# Patient Record
Sex: Male | Born: 1945 | Race: White | Hispanic: No | Marital: Married | Smoking: Former smoker
Health system: Southern US, Community
[De-identification: ages and names within clinical notes are randomized; demographics above are authoritative.]

## PROBLEM LIST (undated history)

## (undated) DIAGNOSIS — R091 Pleurisy: Secondary | ICD-10-CM

## (undated) DIAGNOSIS — C801 Malignant (primary) neoplasm, unspecified: Secondary | ICD-10-CM

## (undated) DIAGNOSIS — R410 Disorientation, unspecified: Secondary | ICD-10-CM

## (undated) DIAGNOSIS — I10 Essential (primary) hypertension: Secondary | ICD-10-CM

## (undated) DIAGNOSIS — I2510 Atherosclerotic heart disease of native coronary artery without angina pectoris: Secondary | ICD-10-CM

## (undated) DIAGNOSIS — H9190 Unspecified hearing loss, unspecified ear: Secondary | ICD-10-CM

## (undated) DIAGNOSIS — L309 Dermatitis, unspecified: Secondary | ICD-10-CM

## (undated) DIAGNOSIS — M549 Dorsalgia, unspecified: Secondary | ICD-10-CM

## (undated) DIAGNOSIS — J329 Chronic sinusitis, unspecified: Secondary | ICD-10-CM

## (undated) DIAGNOSIS — Z8659 Personal history of other mental and behavioral disorders: Secondary | ICD-10-CM

## (undated) DIAGNOSIS — E119 Type 2 diabetes mellitus without complications: Secondary | ICD-10-CM

## (undated) DIAGNOSIS — E7800 Pure hypercholesterolemia, unspecified: Secondary | ICD-10-CM

## (undated) DIAGNOSIS — B354 Tinea corporis: Secondary | ICD-10-CM

## (undated) DIAGNOSIS — I712 Thoracic aortic aneurysm, without rupture: Secondary | ICD-10-CM

## (undated) DIAGNOSIS — K529 Noninfective gastroenteritis and colitis, unspecified: Secondary | ICD-10-CM

## (undated) DIAGNOSIS — B029 Zoster without complications: Secondary | ICD-10-CM

## (undated) DIAGNOSIS — M545 Low back pain, unspecified: Secondary | ICD-10-CM

## (undated) DIAGNOSIS — I7121 Aneurysm of the ascending aorta, without rupture: Secondary | ICD-10-CM

## (undated) DIAGNOSIS — R42 Dizziness and giddiness: Secondary | ICD-10-CM

## (undated) DIAGNOSIS — G8929 Other chronic pain: Secondary | ICD-10-CM

## (undated) DIAGNOSIS — R0989 Other specified symptoms and signs involving the circulatory and respiratory systems: Secondary | ICD-10-CM

## (undated) DIAGNOSIS — K219 Gastro-esophageal reflux disease without esophagitis: Secondary | ICD-10-CM

## (undated) DIAGNOSIS — C61 Malignant neoplasm of prostate: Secondary | ICD-10-CM

## (undated) DIAGNOSIS — E782 Mixed hyperlipidemia: Secondary | ICD-10-CM

## (undated) DIAGNOSIS — Z8709 Personal history of other diseases of the respiratory system: Secondary | ICD-10-CM

## (undated) DIAGNOSIS — R0609 Other forms of dyspnea: Secondary | ICD-10-CM

## (undated) DIAGNOSIS — M159 Polyosteoarthritis, unspecified: Secondary | ICD-10-CM

## (undated) DIAGNOSIS — M2550 Pain in unspecified joint: Secondary | ICD-10-CM

## (undated) DIAGNOSIS — J069 Acute upper respiratory infection, unspecified: Secondary | ICD-10-CM

## (undated) DIAGNOSIS — L409 Psoriasis, unspecified: Secondary | ICD-10-CM

## (undated) HISTORY — DX: Pure hypercholesterolemia, unspecified: E78.00

## (undated) HISTORY — DX: Pleurisy: R09.1

## (undated) HISTORY — DX: Pain in unspecified joint: M25.50

## (undated) HISTORY — DX: Other specified symptoms and signs involving the circulatory and respiratory systems: R09.89

## (undated) HISTORY — DX: Other forms of dyspnea: R06.09

## (undated) HISTORY — DX: Mixed hyperlipidemia: E78.2

## (undated) HISTORY — DX: Type 2 diabetes mellitus without complications: E11.9

## (undated) HISTORY — DX: Acute upper respiratory infection, unspecified: J06.9

## (undated) HISTORY — DX: Tinea corporis: B35.4

## (undated) HISTORY — DX: Essential (primary) hypertension: I10

## (undated) HISTORY — DX: Chronic sinusitis, unspecified: J32.9

## (undated) HISTORY — DX: Zoster without complications: B02.9

## (undated) HISTORY — DX: Noninfective gastroenteritis and colitis, unspecified: K52.9

## (undated) HISTORY — DX: Psoriasis, unspecified: L40.9

## (undated) HISTORY — DX: Low back pain: M54.5

## (undated) HISTORY — DX: Aneurysm of the ascending aorta, without rupture: I71.21

## (undated) HISTORY — DX: Low back pain, unspecified: M54.50

## (undated) HISTORY — DX: Polyosteoarthritis, unspecified: M15.9

## (undated) HISTORY — DX: Thoracic aortic aneurysm, without rupture: I71.2

---

## 2000-08-05 ENCOUNTER — Emergency Department (HOSPITAL_COMMUNITY): Admission: EM | Admit: 2000-08-05 | Discharge: 2000-08-05 | Payer: Self-pay | Admitting: Emergency Medicine

## 2000-08-05 ENCOUNTER — Encounter: Payer: Self-pay | Admitting: Emergency Medicine

## 2004-04-17 ENCOUNTER — Ambulatory Visit: Payer: Self-pay | Admitting: Internal Medicine

## 2007-09-17 ENCOUNTER — Encounter (INDEPENDENT_AMBULATORY_CARE_PROVIDER_SITE_OTHER): Payer: Self-pay | Admitting: Urology

## 2007-09-17 ENCOUNTER — Ambulatory Visit (HOSPITAL_COMMUNITY): Admission: RE | Admit: 2007-09-17 | Discharge: 2007-09-17 | Payer: Self-pay | Admitting: Urology

## 2008-01-15 HISTORY — PX: CIRCUMCISION: SUR203

## 2010-05-29 NOTE — H&P (Signed)
George [name], George [name]                 ACCOUNT NO.:  [id number]   MEDICAL RECORD NO.:  [id number]          PATIENT TYPE:  AMB   LOCATION:  DAY                           FACILITY:  [facility]   PHYSICIAN:  Dennie Maizes, M.D.   DATE OF BIRTH:  05-20-1945   DATE OF ADMISSION:  09/17/2007  DATE OF DISCHARGE:  LH                              HISTORY & PHYSICAL   CHIEF COMPLAINT:  Recurrent inflammation of the foreskin, burning with  passing urine.   HISTORY OF PRESENT ILLNESS:  This 65 year old male was referred to me by  Dr. Sherryll Burger.  He noted recurrent inflammation of the foreskin over the past  3 to 4 years.  The patient complains of burning with passing urine with  urinary frequency x6-8, nocturia x0.  There is no history of hematuria,  urolithiasis, urinary tract infections.  He has discomfort and pain  during sexual intercourse.  He has been unable to have sex for the past  9 months.  He has had diabetes mellitus for the past 6 years.   PAST MEDICAL HISTORY:  History of diabetes mellitus, hypertension.   MEDICATIONS:  Pills for hypertension and diabetes mellitus, Allegra and  aspirin.   ALLERGIES:  None.   OPERATIONS:  None.   FAMILY HISTORY:  Positive for coronary artery disease, MI and diabetes  mellitus.   PHYSICAL EXAMINATION:  VITAL SIGNS:  Height 6 feet, 4 inches, weight 220  pounds.  HEENT:  Normal.  The patient is hard of hearing.  LUNGS:  Clear to auscultation.  HEART:  Regular rate and rhythm.  No murmurs.  ABDOMEN:  Soft.  No palpable frank mass or CVA tenderness.  Bladder is  not palpable.  GENITOURINARY:  Penis has recurrent balanitis with scaring of the  foreskin is noted.  Testes are normal.  There is a small right  epididymal cyst.  RECTAL:  Examination declined by the patient.   IMPRESSION:  Recurrent balanitis, diabetes mellitus.   PLAN:  I have discussed with the patient and his wife regarding  management options.  He is scheduled to undergo circumcision  under  anesthesia at [facility].  I have informed him regarding the  diagnosis, operative details, alternative treatments, outcome, possible  risks and complications, and he has agreed for the procedure to be done.  Aspirin will be discontinued for the week prior to the surgery.      Dennie Maizes, M.D.  Electronically Signed     SK/MEDQ  D:  09/16/2007  T:  09/16/2007  Job:  [id number]   cc:   Dr. Logan Bores [facility]  Fax: [phone]

## 2010-05-29 NOTE — Op Note (Signed)
NAMEMARLEY, George [name]                 ACCOUNT NO.:  [id number]   MEDICAL RECORD NO.:  [id number]          PATIENT TYPE:  AMB   LOCATION:  DAY                           FACILITY:  [facility]   PHYSICIAN:  Dennie Maizes, M.D.   DATE OF BIRTH:  21-Feb-1945   DATE OF PROCEDURE:  09/17/2007  DATE OF DISCHARGE:                               OPERATIVE REPORT   PREOPERATIVE DIAGNOSES:  1. Recurrent balanitis.  2. Diabetes mellitus.   POSTOPERATIVE DIAGNOSES:  1. Recurrent balanitis.  2. Diabetes mellitus.   OPERATIVE PROCEDURE:  Circumcision.   ANESTHESIA:  General.   SURGEON:  Dennie Maizes, MD.   COMPLICATIONS:  None.   ESTIMATED BLOOD LOSS:  Minimal.   SPECIMEN:  Foreskin sent to pathology.   INDICATIONS FOR THE PROCEDURE:  This 65 year old male had diabetes  associated with recurrent balanitis.  He has taken to the operating room  today for circumcision.   DESCRIPTION OF THE PROCEDURE:  General anesthesia was induced and the  patient was placed on the OR table in the supine position.  The lower  abdomen and genitalia were prepped and draped in a sterile fashion.  The  foreskin was then held at 6 and 12 o'clock position with straight  hemostasis.  Dorsal and ventral slits of the foreskin were then made.  The lateral skin flaps were raised.  The redundant foreskin was then  excised.  Hemostasis was  obtained by cauterization.  The edges of the foreskin were approximated  using 4-0 chromic gut.  A Vaseline gauze dressing and Coban are applied  to the penis.  Estimated blood loss was minimal.  The sponges and  instruments were correct x2 at the time of closure.  The patient was  transferred to the PACU in a satisfactory condition.      Dennie Maizes, M.D.  Electronically Signed     SK/MEDQ  D:  09/17/2007  T:  09/17/2007  Job:  [id number]   cc:   Kirstie Peri, MD  Fax: [phone]

## 2010-10-17 LAB — GLUCOSE, CAPILLARY: Glucose-Capillary: 163 — ABNORMAL HIGH

## 2011-01-18 ENCOUNTER — Encounter: Payer: Self-pay | Admitting: Cardiovascular Disease

## 2011-01-22 ENCOUNTER — Telehealth: Payer: Self-pay

## 2011-01-22 NOTE — Telephone Encounter (Signed)
Called and started triage with wife, Carletta. She will call back with complete med list and insurance info. Pt wants to be scheduled with Dr. Jena Gauss and do on a Fri if possible.

## 2011-01-29 NOTE — Telephone Encounter (Signed)
LM with male for pt to call.  

## 2011-01-31 ENCOUNTER — Telehealth: Payer: Self-pay | Admitting: Internal Medicine

## 2011-01-31 NOTE — Telephone Encounter (Signed)
Pt's wife was calling back to get husband set up a colonoscopy with [facility] on a Friday if possible. I told pt's wife that the triage nurse would have to call them back that she wasn't available at the moment. They can be reached at [phone]

## 2011-02-04 NOTE — Telephone Encounter (Signed)
Called and got a fax.

## 2011-02-04 NOTE — Telephone Encounter (Signed)
Called and got fax.

## 2011-02-05 ENCOUNTER — Other Ambulatory Visit: Payer: Self-pay

## 2011-02-05 DIAGNOSIS — Z139 Encounter for screening, unspecified: Secondary | ICD-10-CM

## 2011-02-05 NOTE — Telephone Encounter (Signed)
Gastroenterology Pre-Procedure Form    Request Date: 02/05/2011        Requesting Physician: Dr. Sherryll Burger     PATIENT INFORMATION:  George [name] is a 66 y.o., male (DOB=10-Jan-1946).  PROCEDURE: Procedure(s) requested: colonoscopy Procedure Reason: screening for colon cancer  PATIENT REVIEW QUESTIONS: The patient reports the following:   1. Diabetes Melitis: yes  2. Joint replacements in the past 12 months: no 3. Major health problems in the past 3 months: no 4. Has an artificial valve or MVP:no 5. Has been advised in past to take antibiotics in advance of a procedure like teeth cleaning: no}    MEDICATIONS & ALLERGIES:    Patient reports the following regarding taking any blood thinners:   Plavix? no Aspirin?yes  Coumadin?  no  Patient confirms/reports the following medications:  Current Outpatient Prescriptions  Medication Sig Dispense Refill  . amLODipine (NORVASC) 10 MG tablet Take 10 mg by mouth daily.      Marland Kitchen aspirin 81 MG tablet Take 81 mg by mouth daily.        . cloNIDine (CATAPRES) 0.1 MG tablet Take 0.1 mg by mouth 2 (two) times daily.      Marland Kitchen glimepiride (AMARYL) 4 MG tablet Take 4 mg by mouth 2 (two) times daily.      Marland Kitchen lisinopril (PRINIVIL,ZESTRIL) 40 MG tablet Take 40 mg by mouth daily.      . metFORMIN (GLUCOPHAGE) 500 MG tablet Take 500 mg by mouth 2 (two) times daily with a meal. Takes two tablets twice daily      . NON FORMULARY OTC allergy once daily      . omeprazole (PRILOSEC) 20 MG capsule Take 20 mg by mouth daily.        Patient confirms/reports the following allergies:  No Known Allergies  Patient is appropriate to schedule for requested procedure(s): yes  AUTHORIZATION INFORMATION Primary Insurance:   ID #:   Group #:  Pre-Cert / Auth required:  Pre-Cert / Auth #:   Secondary Insurance:   ID #:  Group #: Pre-Cert / Auth required:  Pre-Cert / Auth #:   No orders of the defined types were placed in this encounter.    SCHEDULE  INFORMATION: Procedure has been scheduled as follows:  Date: 03/08/2011             Time: 9:00 AM  Location: [facility] Short Stay  This Gastroenterology Pre-Precedure Form is being routed to the following provider(s) for review: R. Roetta Sessions, MD

## 2011-02-05 NOTE — Telephone Encounter (Signed)
Pt has been triaged and scheduled for TCS on 03/08/2011.

## 2011-02-05 NOTE — Telephone Encounter (Signed)
Spoke to pt. He said he is not at home. He will have his wife call and give info on meds and insurance.

## 2011-02-06 NOTE — Telephone Encounter (Signed)
OK to schedule. Day of prep: amaryl 2mg  BID. Glucophage 500mg  BID.

## 2011-02-07 NOTE — Telephone Encounter (Signed)
Rx and instructions mailed to pt.  

## 2011-03-06 ENCOUNTER — Telehealth: Payer: Self-pay | Admitting: Internal Medicine

## 2011-03-06 NOTE — Telephone Encounter (Signed)
Pt's wife called to let us know that they decided to cancel his procedure for Friday with [facility]. Patient's blood pressure has been running high and they will call us back to [facility]. I also LMOM for Florian Buff at Short Stay that this should be cancelled.

## 2011-03-06 NOTE — Telephone Encounter (Signed)
Patient call noted. If we have not heard from him in 3 months, we should probably give him a call to reschedule.

## 2011-03-07 NOTE — Telephone Encounter (Signed)
Placed on my recall later list to call  May if we have not heard from him.

## 2011-03-08 ENCOUNTER — Encounter (HOSPITAL_COMMUNITY): Admission: RE | Payer: Self-pay | Source: Ambulatory Visit

## 2011-03-08 ENCOUNTER — Ambulatory Visit (HOSPITAL_COMMUNITY): Admission: RE | Admit: 2011-03-08 | Payer: Medicare Other | Source: Ambulatory Visit | Admitting: Internal Medicine

## 2011-03-08 SURGERY — COLONOSCOPY
Anesthesia: Moderate Sedation

## 2011-06-05 ENCOUNTER — Telehealth: Payer: Self-pay

## 2011-06-05 NOTE — Telephone Encounter (Signed)
Pt was referred by Dr. Sherryll Burger in January 2013 for a colonoscopy. Scheduled for February and had blood pressure problems and wanted to wait for awhile. Called, [employer] for a return call.

## 2011-06-06 NOTE — Telephone Encounter (Signed)
Called and spoke to pt's wife. She said she will ask him. York Spaniel he has been having some problems with his sugar, but she will let me know.

## 2011-06-20 ENCOUNTER — Telehealth: Payer: Self-pay

## 2011-06-20 NOTE — Telephone Encounter (Signed)
Called to see if pt is ready to schedule screening colonoscopy. ( He had BP problems in February and had to post pone.) His wife said that he said he is just not ready to do it yet, and he will call when he is ready.

## 2011-06-23 NOTE — Telephone Encounter (Signed)
Ok; no further action needed on our part; it up to pt to call us if he wants to have it done

## 2012-08-03 ENCOUNTER — Encounter: Payer: Self-pay | Admitting: Cardiovascular Disease

## 2012-08-03 DIAGNOSIS — R079 Chest pain, unspecified: Secondary | ICD-10-CM

## 2012-08-04 ENCOUNTER — Encounter: Payer: Self-pay | Admitting: Cardiology

## 2012-08-05 ENCOUNTER — Ambulatory Visit (INDEPENDENT_AMBULATORY_CARE_PROVIDER_SITE_OTHER): Payer: Medicare Other | Admitting: Cardiovascular Disease

## 2012-08-05 ENCOUNTER — Telehealth: Payer: Self-pay | Admitting: Cardiovascular Disease

## 2012-08-05 ENCOUNTER — Encounter (HOSPITAL_COMMUNITY): Payer: Self-pay | Admitting: Pharmacy Technician

## 2012-08-05 ENCOUNTER — Encounter: Payer: Self-pay | Admitting: Cardiovascular Disease

## 2012-08-05 ENCOUNTER — Encounter: Payer: Self-pay | Admitting: *Deleted

## 2012-08-05 VITALS — BP 144/76 | HR 74 | Ht 76.0 in | Wt 220.0 lb

## 2012-08-05 DIAGNOSIS — I10 Essential (primary) hypertension: Secondary | ICD-10-CM | POA: Insufficient documentation

## 2012-08-05 DIAGNOSIS — R079 Chest pain, unspecified: Secondary | ICD-10-CM

## 2012-08-05 DIAGNOSIS — R9439 Abnormal result of other cardiovascular function study: Secondary | ICD-10-CM | POA: Insufficient documentation

## 2012-08-05 DIAGNOSIS — R0602 Shortness of breath: Secondary | ICD-10-CM

## 2012-08-05 MED ORDER — NITROGLYCERIN 0.4 MG SL SUBL: 0.4000 mg | SUBLINGUAL_TABLET | SUBLINGUAL | Status: DC | PRN

## 2012-08-05 MED ORDER — ATORVASTATIN CALCIUM 20 MG PO TABS: 20.0000 mg | ORAL_TABLET | Freq: Every day | ORAL | Status: DC

## 2012-08-05 MED ORDER — METOPROLOL SUCCINATE ER 25 MG PO TB24: 25.0000 mg | ORAL_TABLET | Freq: Every day | ORAL | Status: DC

## 2012-08-05 NOTE — Telephone Encounter (Signed)
Pt has Medicare only.  No precert required for heart cath

## 2012-08-05 NOTE — Progress Notes (Signed)
Patient ID: JEREMIH DEARMAS, male   DOB: 20-Jul-1945, 67 y.o.   MRN: [id number]    CARDIOLOGY CONSULT NOTE  Patient ID: DUVALL COMES MRN: [id number] DOB/AGE: [birth year] 67 y.o.  Primary Physician: Kirstie Peri, MD Reason for Consultation: chest pain, abnormal stress test  HPI:  Mr. Grater is a 67 y.o. Male with a PMH significant for HTN and diabetes who has been experiencing chest pain and shortness of breath with strenuous exertion. 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 It has been bothering him over the last 3 weeks. He has had some episodes of lightheadedness and dizziness. He denies palpitations. Resting alleviates his symptoms.   I interpreted a stress echocardiogram 2 days ago which showed stress-induced ischemia of the apex and anteroseptal walls, with dynamic ECG changes. His resting LV systolic function was 55-60%.  Review of systems complete and found to be negative unless listed above in HPI  Past Medical History: as per HPI   No family history on file.  History   Social History  . Marital Status: Married    Spouse Name: N/A    Number of Children: N/A  . Years of Education: N/A   Occupational History  . Not on file.   Social History Main Topics  . Smoking status: Former Smoker    Quit date: 01/15/1992  . Smokeless tobacco: Not on file  . Alcohol Use: No  . Drug Use: Not on file  . Sexually Active: Not on file   Other Topics Concern  . Not on file   Social History Narrative  . No narrative on file      Physical exam Blood pressure 144/76, pulse 74, height 6\' 4"  (1.93 m), weight 220 lb (99.791 kg). General: NAD Neck: No JVD, no thyromegaly or thyroid nodule.  Lungs: Clear to auscultation bilaterally with normal respiratory effort. CV: Nondisplaced PMI.  Heart regular S1/S2, no S3/S4, no murmur.  No peripheral edema.  No carotid bruit.  Normal pedal pulses.  Abdomen: Soft, nontender, no hepatosplenomegaly, no distention.   Skin: Intact without lesions or rashes.  Neurologic: Alert and oriented x 3.  Psych: Normal affect. Extremities: No clubbing or cyanosis.  HEENT: Normal.   Labs:   No results found for this basename: WBC, HGB, HCT, MCV, PLT   No results found for this basename: NA, K, CL, CO2, BUN, CREATININE, CALCIUM, LABALBU, PROT, BILITOT, ALKPHOS, ALT, AST, GLUCOSE,  in the last 168 hours No results found for this basename: CKTOTAL, CKMB, CKMBINDEX, TROPONINI    No results found for this basename: CHOL   No results found for this basename: HDL   No results found for this basename: LDLCALC   No results found for this basename: TRIG   No results found for this basename: CHOLHDL   No results found for this basename: LDLDIRECT         Stress Echo reviewed in HPI  ASSESSMENT AND PLAN:  1. Chest pain and shortness of breath, in the setting of an abnormal stress echocardiogram: I had a long discussion with the patient and his wife. His stress echo was suggestive of ischemia involving the LAD coronary arterial distribution. I have made arrangements for him to undergo cardiac catheterization at [facility]. In the meantime, I will prescribe sublingual nitroglycerin to be used as needed for chest pain. I will also start him on Lipitor 20 mg daily and Toprol-XL 25 mg daily. 2. HTN: he is  taking Clonidine, Lisinpril, and Losartan. By starting him on Toprol-XL 25 mg daily, this may help to reduce his SBP. Toprol, lipitor, sl ntg, cath Signed: Prentice Docker, M.D., F.A.C.C. 08/05/2012, 3:17 PM

## 2012-08-05 NOTE — Patient Instructions (Addendum)
Your physician has recommended you make the following change in your medication:   START TOPROL 25 MG ONCE A DAY START LIPITOR 20 MG ONCE A DAY START NITRO 0.4MG  SUBL AS NEEDED ONLY (TAKE ALL OTHER MEDICATIONS THE SAME WAY)  Your physician has requested that you have a cardiac catheterization. Cardiac catheterization is used to diagnose and/or treat various heart conditions. Doctors may recommend this procedure for a number of different reasons. The most common reason is to evaluate chest pain. Chest pain can be a symptom of coronary artery disease (CAD), and cardiac catheterization can show whether plaque is narrowing or blocking your heart's arteries. This procedure is also used to evaluate the valves, as well as measure the blood flow and oxygen levels in different parts of your heart. Please follow instruction sheet, as given.

## 2012-08-07 ENCOUNTER — Encounter (HOSPITAL_COMMUNITY): Payer: Self-pay | Admitting: *Deleted

## 2012-08-07 ENCOUNTER — Inpatient Hospital Stay (HOSPITAL_COMMUNITY)
Admission: RE | Admit: 2012-08-07 | Discharge: 2012-08-08 | DRG: 287 | Disposition: A | Discharge status: Home / Self Care | Payer: Medicare Other | Source: Ambulatory Visit | Attending: Cardiovascular Disease | Admitting: Cardiovascular Disease

## 2012-08-07 ENCOUNTER — Other Ambulatory Visit: Payer: Self-pay

## 2012-08-07 ENCOUNTER — Encounter (HOSPITAL_COMMUNITY)
Admission: RE | Disposition: A | Discharge status: Home / Self Care | Payer: Self-pay | Source: Ambulatory Visit | Attending: Cardiovascular Disease

## 2012-08-07 DIAGNOSIS — L408 Other psoriasis: Secondary | ICD-10-CM | POA: Diagnosis present

## 2012-08-07 DIAGNOSIS — I2510 Atherosclerotic heart disease of native coronary artery without angina pectoris: Secondary | ICD-10-CM

## 2012-08-07 DIAGNOSIS — M159 Polyosteoarthritis, unspecified: Secondary | ICD-10-CM | POA: Diagnosis present

## 2012-08-07 DIAGNOSIS — I10 Essential (primary) hypertension: Secondary | ICD-10-CM | POA: Diagnosis present

## 2012-08-07 DIAGNOSIS — I209 Angina pectoris, unspecified: Secondary | ICD-10-CM | POA: Diagnosis present

## 2012-08-07 DIAGNOSIS — Z79899 Other long term (current) drug therapy: Secondary | ICD-10-CM

## 2012-08-07 DIAGNOSIS — E1142 Type 2 diabetes mellitus with diabetic polyneuropathy: Secondary | ICD-10-CM | POA: Diagnosis present

## 2012-08-07 DIAGNOSIS — Z87891 Personal history of nicotine dependence: Secondary | ICD-10-CM

## 2012-08-07 DIAGNOSIS — E1149 Type 2 diabetes mellitus with other diabetic neurological complication: Secondary | ICD-10-CM | POA: Diagnosis present

## 2012-08-07 DIAGNOSIS — Z7982 Long term (current) use of aspirin: Secondary | ICD-10-CM

## 2012-08-07 DIAGNOSIS — Z794 Long term (current) use of insulin: Secondary | ICD-10-CM

## 2012-08-07 DIAGNOSIS — E7800 Pure hypercholesterolemia, unspecified: Secondary | ICD-10-CM | POA: Diagnosis present

## 2012-08-07 HISTORY — PX: LEFT HEART CATHETERIZATION WITH CORONARY ANGIOGRAM: SHX5451

## 2012-08-07 LAB — CBC
HCT: 37.9 % — ABNORMAL LOW (ref 39.0–52.0)
MCHC: 36.1 g/dL — ABNORMAL HIGH (ref 30.0–36.0)
MCV: 79 fL (ref 78.0–100.0)
Platelets: 194 10*3/uL (ref 150–400)
RDW: 13.2 % (ref 11.5–15.5)

## 2012-08-07 LAB — BASIC METABOLIC PANEL
BUN: 13 mg/dL (ref 6–23)
Calcium: 9.2 mg/dL (ref 8.4–10.5)
GFR calc Af Amer: >90 mL/min (ref >90)
GFR calc non Af Amer: >90 mL/min (ref >90)
Glucose, Bld: 198 mg/dL — ABNORMAL HIGH (ref 70–99)
Potassium: 4.4 mEq/L (ref 3.5–5.1)
Sodium: 137 mEq/L (ref 135–145)

## 2012-08-07 LAB — GLUCOSE, CAPILLARY: Glucose-Capillary: 198 mg/dL — ABNORMAL HIGH (ref 70–99)

## 2012-08-07 SURGERY — LEFT HEART CATHETERIZATION WITH CORONARY ANGIOGRAM
Anesthesia: LOCAL

## 2012-08-07 MED ORDER — IBUPROFEN 200 MG PO TABS
200.0000 mg | ORAL_TABLET | Freq: Two times a day (BID) | ORAL | Status: DC
  Administered 2012-08-08: 200 mg via ORAL
  Filled 2012-08-07 (×3): qty 1

## 2012-08-07 MED ORDER — INSULIN GLARGINE 100 UNIT/ML ~~LOC~~ SOLN
45.0000 [IU] | Freq: Every day | SUBCUTANEOUS | Status: DC
  Administered 2012-08-08: 45 [IU] via SUBCUTANEOUS
  Filled 2012-08-07: qty 0.45

## 2012-08-07 MED ORDER — SODIUM CHLORIDE 0.9 % IJ SOLN: 3.0000 mL | INTRAMUSCULAR | Status: DC | PRN

## 2012-08-07 MED ORDER — NITROGLYCERIN 0.4 MG SL SUBL: 0.4000 mg | SUBLINGUAL_TABLET | SUBLINGUAL | Status: DC | PRN

## 2012-08-07 MED ORDER — ACETAMINOPHEN 325 MG PO TABS: 650.0000 mg | ORAL_TABLET | ORAL | Status: DC | PRN

## 2012-08-07 MED ORDER — LIDOCAINE HCL (PF) 1 % IJ SOLN
INTRAMUSCULAR | Status: AC
  Filled 2012-08-07: qty 30

## 2012-08-07 MED ORDER — HEPARIN (PORCINE) IN NACL 100-0.45 UNIT/ML-% IJ SOLN
1400.0000 [IU]/h | INTRAMUSCULAR | Status: DC
  Administered 2012-08-07: 1400 [IU]/h via INTRAVENOUS
  Filled 2012-08-07 (×2): qty 250

## 2012-08-07 MED ORDER — ONDANSETRON HCL 4 MG/2ML IJ SOLN: 4.0000 mg | Freq: Four times a day (QID) | INTRAMUSCULAR | Status: DC | PRN

## 2012-08-07 MED ORDER — LOSARTAN POTASSIUM 50 MG PO TABS
50.0000 mg | ORAL_TABLET | Freq: Every day | ORAL | Status: DC
Start: 2012-08-08 — End: 2012-08-08
  Administered 2012-08-08: 50 mg via ORAL
  Filled 2012-08-07: qty 1

## 2012-08-07 MED ORDER — CLONIDINE HCL 0.1 MG PO TABS
0.1000 mg | ORAL_TABLET | Freq: Two times a day (BID) | ORAL | Status: DC
  Administered 2012-08-07 – 2012-08-08 (×2): 0.1 mg via ORAL
  Filled 2012-08-07 (×3): qty 1

## 2012-08-07 MED ORDER — OXYCODONE-ACETAMINOPHEN 5-325 MG PO TABS: 1.0000 | ORAL_TABLET | ORAL | Status: DC | PRN

## 2012-08-07 MED ORDER — ATORVASTATIN CALCIUM 20 MG PO TABS
20.0000 mg | ORAL_TABLET | Freq: Every day | ORAL | Status: DC
  Administered 2012-08-08: 20 mg via ORAL
  Filled 2012-08-07: qty 1

## 2012-08-07 MED ORDER — ASPIRIN EC 325 MG PO TBEC
325.0000 mg | DELAYED_RELEASE_TABLET | Freq: Every day | ORAL | Status: DC
  Administered 2012-08-08: 325 mg via ORAL
  Filled 2012-08-07: qty 1

## 2012-08-07 MED ORDER — AMLODIPINE BESYLATE 10 MG PO TABS
10.0000 mg | ORAL_TABLET | Freq: Every day | ORAL | Status: DC
  Administered 2012-08-08: 10 mg via ORAL
  Filled 2012-08-07: qty 1

## 2012-08-07 MED ORDER — VERAPAMIL HCL 2.5 MG/ML IV SOLN
INTRAVENOUS | Status: AC
  Filled 2012-08-07: qty 2

## 2012-08-07 MED ORDER — ASPIRIN 81 MG PO CHEW: 324.0000 mg | CHEWABLE_TABLET | ORAL | Status: DC

## 2012-08-07 MED ORDER — INSULIN GLARGINE 100 UNIT/ML ~~LOC~~ SOLN
5.0000 [IU] | Freq: Every day | SUBCUTANEOUS | Status: DC
  Administered 2012-08-07: 5 [IU] via SUBCUTANEOUS
  Filled 2012-08-07 (×2): qty 0.05

## 2012-08-07 MED ORDER — PANTOPRAZOLE SODIUM 40 MG PO TBEC
80.0000 mg | DELAYED_RELEASE_TABLET | Freq: Every day | ORAL | Status: DC
  Administered 2012-08-08: 80 mg via ORAL
  Filled 2012-08-07: qty 2

## 2012-08-07 MED ORDER — HEPARIN (PORCINE) IN NACL 2-0.9 UNIT/ML-% IJ SOLN
INTRAMUSCULAR | Status: AC
  Filled 2012-08-07: qty 1000

## 2012-08-07 MED ORDER — FENTANYL CITRATE 0.05 MG/ML IJ SOLN
INTRAMUSCULAR | Status: AC
  Filled 2012-08-07: qty 2

## 2012-08-07 MED ORDER — METFORMIN HCL 500 MG PO TABS
1000.0000 mg | ORAL_TABLET | Freq: Two times a day (BID) | ORAL | Status: DC
  Filled 2012-08-07 (×2): qty 2

## 2012-08-07 MED ORDER — SODIUM CHLORIDE 0.9 % IV SOLN: 250.0000 mL | INTRAVENOUS | Status: DC | PRN

## 2012-08-07 MED ORDER — GLIMEPIRIDE 4 MG PO TABS
4.0000 mg | ORAL_TABLET | Freq: Two times a day (BID) | ORAL | Status: DC
  Administered 2012-08-08: 4 mg via ORAL
  Filled 2012-08-07 (×5): qty 1

## 2012-08-07 MED ORDER — METOPROLOL SUCCINATE ER 25 MG PO TB24
25.0000 mg | ORAL_TABLET | Freq: Every day | ORAL | Status: DC
  Administered 2012-08-08: 25 mg via ORAL
  Filled 2012-08-07: qty 1

## 2012-08-07 MED ORDER — SODIUM CHLORIDE 0.9 % IJ SOLN: 3.0000 mL | Freq: Two times a day (BID) | INTRAMUSCULAR | Status: DC

## 2012-08-07 MED ORDER — INSULIN GLARGINE 100 UNIT/ML ~~LOC~~ SOLN: 5.0000 [IU] | Freq: Two times a day (BID) | SUBCUTANEOUS | Status: DC

## 2012-08-07 MED ORDER — MIDAZOLAM HCL 2 MG/2ML IJ SOLN
INTRAMUSCULAR | Status: AC
  Filled 2012-08-07: qty 2

## 2012-08-07 MED ORDER — LORATADINE 10 MG PO TABS
10.0000 mg | ORAL_TABLET | Freq: Every day | ORAL | Status: DC
  Administered 2012-08-08: 10 mg via ORAL
  Filled 2012-08-07: qty 1

## 2012-08-07 MED ORDER — LISINOPRIL 40 MG PO TABS
40.0000 mg | ORAL_TABLET | Freq: Every day | ORAL | Status: DC
  Administered 2012-08-08: 40 mg via ORAL
  Filled 2012-08-07: qty 1

## 2012-08-07 MED ORDER — SODIUM CHLORIDE 0.9 % IV SOLN
INTRAVENOUS | Status: DC
  Administered 2012-08-07: 12:00:00 via INTRAVENOUS

## 2012-08-07 MED ORDER — HEPARIN SODIUM (PORCINE) 1000 UNIT/ML IJ SOLN
INTRAMUSCULAR | Status: AC
  Filled 2012-08-07: qty 1

## 2012-08-07 MED ORDER — SODIUM CHLORIDE 0.45 % IV SOLN: INTRAVENOUS | Status: AC

## 2012-08-07 MED ORDER — ASPIRIN EC 81 MG PO TBEC: 81.0000 mg | DELAYED_RELEASE_TABLET | Freq: Every day | ORAL | Status: DC

## 2012-08-07 MED ORDER — METFORMIN HCL 500 MG PO TABS: 1000.0000 mg | ORAL_TABLET | Freq: Two times a day (BID) | ORAL | Status: DC

## 2012-08-07 NOTE — Interval H&P Note (Signed)
Cath Lab Visit (complete for each Cath Lab visit)  Clinical Evaluation Leading to the Procedure:   ACS: no  Non-ACS:    Anginal Classification: CCS III  Anti-ischemic medical therapy: Minimal Therapy (1 class of medications)  Non-Invasive Test Results: Intermediate-risk stress test findings: cardiac mortality 1-3%/year  Prior CABG: No previous CABG      History and Physical Interval Note:  08/07/2012 2:15 PM  George [name]  has presented today for surgery, with the diagnosis of cp  The various methods of treatment have been discussed with the patient and family. After consideration of risks, benefits and other options for treatment, the patient has consented to  Procedure(s): LEFT HEART CATHETERIZATION WITH CORONARY ANGIOGRAM (N/A) as a surgical intervention .  The patient's history has been reviewed, patient examined, no change in status, stable for surgery.  I have reviewed the patient's chart and labs.  Questions were answered to the patient's satisfaction.     Charlton Haws

## 2012-08-07 NOTE — H&P (Signed)
[address]       [place] [address]             [phone]        George [name] [facility] Medical Record #[id number] Date of Birth: 06-Dec-1945  Referring: No ref. provider found Primary Care: Kirstie Peri, MD  Chief complaint chest pain with exertion  History of Present Illness:     67 year old hypertensive diabetic reformed smoker with recent exertional chest discomfort while doing his job as a longer period episodes of pain have become increased in frequency and severity. No resting symptoms. He has had associated lightheadedness and dizziness but no syncope. No orthopnea PND or ankle edema. No positive family history of MI or CABG. No history of prior arrhythmia cardiac murmur or history of rheumatic heart disease. Patient had a stress echocardiogram by Dr. Purvis Sheffield in [place] which was positive for ischemia. Today he underwent cardiac catheterization via right radial artery showing moderate left main stenosis with high-grade LAD ramus and OM disease. LVEF normal. CABG has been recommended. Patient currently stable after his heart cath. Current Activity/ Functional Status: Able to tolerate normal activity without chest pain   Zubrod Score: At the time of surgery this patient's most appropriate activity status/level should be described as: []  Normal activity, no symptoms [x]  Symptoms, fully ambulatory []  Symptoms, in bed less than or equal to 50% of the time []  Symptoms, in bed greater than 50% of the time but less than 100% []  Bedridden []  Moribund  Past Medical History  Diagnosis Date  . Unspecified essential hypertension   . Generalized osteoarthrosis, unspecified site   . Pure hypercholesterolemia   . Herpes zoster without mention of complication   . Diabetic neuropathy   . Psoriasis and similar disorder   . Sinusitis   . Lumbago   . Chest pain   . Acute upper respiratory infections of unspecified site   . Dermatophytosis of the body   .  Pleurisy without mention of effusion or current tuberculosis   . Colitis, enteritis, and gastroenteritis of presumed infectious origin   . Joint pain   . Other dyspnea and respiratory abnormality     No past surgical history on file.  History  Smoking status  . Former Smoker  . Quit date: 01/15/1992  Smokeless tobacco  . Not on file    History  Alcohol Use No   minimal alcohol use  History   Social History  . Marital Status: Married    Spouse Name: N/A    Number of Children: N/A  . Years of Education: N/A   Occupational History  . Not on file.   Social History Main Topics  . Smoking status: Former Smoker    Quit date: 01/15/1992  . Smokeless tobacco: Not on file  . Alcohol Use: No  . Drug Use: Not on file  . Sexually Active: Not on file   Other Topics Concern  . Not on file   Social History Narrative  . No narrative on file    No Known Allergies  Current Facility-Administered Medications  Medication Dose Route Frequency Provider Last Rate Last Dose  . acetaminophen (TYLENOL) tablet 650 mg  650 mg Oral Q4H PRN Wendall Stade, MD      . Melene Muller ON 08/08/2012] amLODipine (NORVASC) tablet 10 mg  10 mg Oral Daily Wendall Stade, MD      . Melene Muller ON 08/08/2012] aspirin EC tablet 325 mg  325 mg  Oral Daily Wendall Stade, MD      . Melene Muller ON 08/08/2012] atorvastatin (LIPITOR) tablet 20 mg  20 mg Oral Daily Wendall Stade, MD      . cloNIDine (CATAPRES) tablet 0.1 mg  0.1 mg Oral BID Wendall Stade, MD      . glimepiride (AMARYL) tablet 4 mg  4 mg Oral BID WC Wendall Stade, MD      . heparin ADULT infusion 100 units/mL (25000 units/250 mL)  1,400 Units/hr Intravenous Continuous Tonny Bollman, MD      . Melene Muller ON 08/08/2012] ibuprofen (ADVIL,MOTRIN) tablet 200 mg  200 mg Oral BID WC Wendall Stade, MD      . Melene Muller ON 08/08/2012] insulin glargine (LANTUS) injection 45 Units  45 Units Subcutaneous Daily Tonny Bollman, MD      . insulin glargine (LANTUS) injection 5 Units   5 Units Subcutaneous QHS Tonny Bollman, MD      . Melene Muller ON 08/08/2012] lisinopril (PRINIVIL,ZESTRIL) tablet 40 mg  40 mg Oral Daily Wendall Stade, MD      . Melene Muller ON 08/08/2012] loratadine (CLARITIN) tablet 10 mg  10 mg Oral Daily Wendall Stade, MD      . Melene Muller ON 08/08/2012] losartan (COZAAR) tablet 50 mg  50 mg Oral Daily Wendall Stade, MD      . Melene Muller ON 08/09/2012] metFORMIN (GLUCOPHAGE) tablet 1,000 mg  1,000 mg Oral BID WC Tonny Bollman, MD      . Melene Muller ON 08/08/2012] metoprolol succinate (TOPROL-XL) 24 hr tablet 25 mg  25 mg Oral Daily Wendall Stade, MD      . nitroGLYCERIN (NITROSTAT) SL tablet 0.4 mg  0.4 mg Sublingual Q5 min PRN Wendall Stade, MD      . ondansetron [facility]) injection 4 mg  4 mg Intravenous Q6H PRN Wendall Stade, MD      . oxyCODONE-acetaminophen (PERCOCET/ROXICET) 5-325 MG per tablet 1-2 tablet  1-2 tablet Oral Q4H PRN Wendall Stade, MD      . Melene Muller ON 08/08/2012] pantoprazole (PROTONIX) EC tablet 80 mg  80 mg Oral Daily Wendall Stade, MD        Prescriptions prior to admission  Medication Sig Dispense Refill  . amLODipine (NORVASC) 10 MG tablet Take 10 mg by mouth daily.      Marland Kitchen aspirin EC 81 MG tablet Take 81 mg by mouth daily.      Marland Kitchen atorvastatin (LIPITOR) 20 MG tablet Take 20 mg by mouth daily.      . cloNIDine (CATAPRES) 0.1 MG tablet Take 0.1 mg by mouth 2 (two) times daily.      . fexofenadine (ALLEGRA) 180 MG tablet Take 180 mg by mouth at bedtime.      . fluticasone (FLONASE) 50 MCG/ACT nasal spray Place 2 sprays into the nose daily as needed for rhinitis or allergies.       Marland Kitchen glimepiride (AMARYL) 4 MG tablet Take 4 mg by mouth 2 (two) times daily.      Marland Kitchen ibuprofen (ADVIL,MOTRIN) 200 MG tablet Take 200 mg by mouth 2 (two) times daily.       . insulin glargine (LANTUS) 100 UNIT/ML injection Inject 5-45 Units into the skin 2 (two) times daily. 45 units in the morning and 5-15 units at bedtime per sliding scale      . lisinopril (PRINIVIL,ZESTRIL) 40  MG tablet Take 40 mg by mouth daily.      Marland Kitchen losartan (COZAAR) 100  MG tablet Take 100 mg by mouth daily.      . metFORMIN (GLUCOPHAGE) 500 MG tablet Take 1,000 mg by mouth 2 (two) times daily with a meal. Takes two tablets twice daily      . metoprolol succinate (TOPROL-XL) 25 MG 24 hr tablet Take 25 mg by mouth daily.      Marland Kitchen omeprazole (PRILOSEC) 20 MG capsule Take 20 mg by mouth daily.      . nitroGLYCERIN (NITROSTAT) 0.4 MG SL tablet Place 0.4 mg under the tongue every 5 (five) minutes as needed for chest pain.        No family history on file.   Review of Systems:  Patient has diabetes on insulin and metformin Patient is right-hand dominant Patient has no history of previous venous disease No history of TIA or mini stroke No history of thoracic trauma or pneumothorax   Cardiac Review of Systems: Y or N   Chest Pain [ Y.   ]  Resting SOB [ N.  ] Exertional SOB  [Y.  ]  Orthopnea [N.  ]   Pedal Edema [and   ]    Palpitations [Y.  ] Syncope  [N.  ]   Presyncope [ Y.  ]  General Review of Systems: [Y] = yes [  ]=no Constitional: recent weight change [  ]; anorexia [  ]; fatigue [  ]; nausea [  ]; night sweats [  ]; fever [  ]; or chills [  ]                                                               Dental: poor dentition[  ]; Last Dentist visit: 1 year  Eye : blurred vision [  ]; diplopia [   ]; vision changes [  ];  Amaurosis fugax[  ]; Resp: cough [  ];  wheezing[  ];  hemoptysis[  ]; shortness of breath[  ]; paroxysmal nocturnal dyspnea[  ]; dyspnea on exertion[ Y. in ]; or orthopnea[  ];  GI:  gallstones[  ], vomiting[  ];  dysphagia[  ]; melena[  ];  hematochezia [  ]; heartburn[  ];   Hx of  Colonoscopy[  ]; GU: kidney stones [  ]; hematuria[  ];   dysuria [  ];  nocturia[  ];  history of     obstruction [  ]; urinary frequency [  ]             Skin: rash, swelling[  ];, hair loss[  ];  peripheral edema[  ];  or itching[  ]; Musculosketetal: myalgias[  ];  joint swelling[  ];   joint erythema[  ];  joint pain[  ];  back pain[  ];  Heme/Lymph: bruising[  ];  bleeding[  ];  anemia[  ];  Neuro: TIA[  ];  headaches[  ];  stroke[  ];  vertigo[  ];  seizures[  ];   paresthesias[  ];  difficulty walking[  ];  Psych:depression[  ]; anxiety[  ];  Endocrine: diabetes Y. [  ];  thyroid dysfunction[  ];  Immunizations: Flu [  ]; Pneumococcal[  ];  Other:  Physical Exam: BP 146/69  Pulse 66  Temp(Src) 97.4 F (36.3 C) (Oral)  Resp  20  Ht 6\' 4"  (1.93 m)  Wt 220 lb (99.791 kg)  BMI 26.79 kg/m2  SpO2 99%  Physical exam General appearance well-nourished middle-aged male no acute distress HEENT normocephalic dentition good pupils equal Neck without JVD mass or carotid bruit Lymphatics without palpable adenopathy in the neck or supraclavicular fossa Thorax without deformity tenderness, clear breath sounds bilaterally Cardiac regular rhythm without murmur or gallop Abdomen soft nontender without pulsatile mass Extremities no deformity clubbing or cyanosis, no edema, compression dressing right wrist for radial artery cath site Vascular positive pulses in all extremities Neuro no focal motor deficit, right-hand dominant   Diagnostic Studies & Laboratory data:   Coronary angiogram reviewed demonstrating moderate left main stenosis with high-grade lesions of the LAD ramus and OM. The LAD, ramus, and OM are adequate targets  Recent Radiology Findings:   No results found.    Recent Lab Findings: Lab Results  Component Value Date   WBC 5.1 08/07/2012   HGB 13.7 08/07/2012   HCT 37.9* 08/07/2012   PLT 194 08/07/2012   GLUCOSE 198* 08/07/2012   NA 137 08/07/2012   K 4.4 08/07/2012   CL 100 08/07/2012   CREATININE 0.78 08/07/2012   BUN 13 08/07/2012   CO2 26 08/07/2012   INR 0.98 08/07/2012      Assessment / Plan:      67 year old male with class III exertional angina, positive stress test and severe multivessel CAD Plan CABG on July 31, patient will be evaluated through  short stay to get preop labs on July 29. Procedure indications benefits and risks discussed with patient and family and he agrees to proceed.      @ME1 @ 08/07/2012 7:29 PM

## 2012-08-07 NOTE — Progress Notes (Signed)
ANTICOAGULATION CONSULT NOTE - Initial Consult  Pharmacy Consult for UFH Indication: severe multivessel CAD  No Known Allergies  Patient Measurements: Height: 6\' 4"  (193 cm) Weight: 220 lb (99.791 kg) IBW/kg (Calculated) : 86.8 Heparin Dosing Weight: 100kg  Vital Signs: Temp: 97.4 F (36.3 C) (07/25 1543) Temp src: Oral (07/25 1543) BP: 139/67 mmHg (07/25 1543) Pulse Rate: 60 (07/25 1543)  Labs:  Recent Labs  08/07/12 1152  HGB 13.7  HCT 37.9*  PLT 194  LABPROT 12.8  INR 0.98  CREATININE 0.78    Estimated Creatinine Clearance: 110 ml/min (by C-G formula based on Cr of 0.78).   Medical History: Past Medical History  Diagnosis Date  . Unspecified essential hypertension   . Generalized osteoarthrosis, unspecified site   . Pure hypercholesterolemia   . Herpes zoster without mention of complication   . Diabetic neuropathy   . Psoriasis and similar disorder   . Sinusitis   . Lumbago   . Chest pain   . Acute upper respiratory infections of unspecified site   . Dermatophytosis of the body   . Pleurisy without mention of effusion or current tuberculosis   . Colitis, enteritis, and gastroenteritis of presumed infectious origin   . Joint pain   . Other dyspnea and respiratory abnormality     Medications:  Prescriptions prior to admission  Medication Sig Dispense Refill  . amLODipine (NORVASC) 10 MG tablet Take 10 mg by mouth daily.      Marland Kitchen aspirin EC 81 MG tablet Take 81 mg by mouth daily.      Marland Kitchen atorvastatin (LIPITOR) 20 MG tablet Take 20 mg by mouth daily.      . cloNIDine (CATAPRES) 0.1 MG tablet Take 0.1 mg by mouth 2 (two) times daily.      . fexofenadine (ALLEGRA) 180 MG tablet Take 180 mg by mouth at bedtime.      . fluticasone (FLONASE) 50 MCG/ACT nasal spray Place 2 sprays into the nose daily as needed for rhinitis or allergies.       Marland Kitchen glimepiride (AMARYL) 4 MG tablet Take 4 mg by mouth 2 (two) times daily.      Marland Kitchen ibuprofen (ADVIL,MOTRIN) 200 MG tablet  Take 200 mg by mouth 2 (two) times daily.       . insulin glargine (LANTUS) 100 UNIT/ML injection Inject 5-45 Units into the skin 2 (two) times daily. 45 units in the morning and 5-15 units at bedtime per sliding scale      . lisinopril (PRINIVIL,ZESTRIL) 40 MG tablet Take 40 mg by mouth daily.      Marland Kitchen losartan (COZAAR) 100 MG tablet Take 100 mg by mouth daily.      . metFORMIN (GLUCOPHAGE) 500 MG tablet Take 1,000 mg by mouth 2 (two) times daily with a meal. Takes two tablets twice daily      . metoprolol succinate (TOPROL-XL) 25 MG 24 hr tablet Take 25 mg by mouth daily.      Marland Kitchen omeprazole (PRILOSEC) 20 MG capsule Take 20 mg by mouth daily.      . nitroGLYCERIN (NITROSTAT) 0.4 MG SL tablet Place 0.4 mg under the tongue every 5 (five) minutes as needed for chest pain.        Assessment: 67 y/o male patient admitted with angina, underwent cardiac cath and was found to have severe multivessel CAD requiring [facility] consult for possible CABG. Patient was accessed using TR band and heparin will start heparin gtt 8 hours after band removal 2300 today  with no bolus.  Goal of Therapy:  Heparin level 0.3-0.7 units/ml Monitor platelets by anticoagulation protocol: Yes   Plan:  Begin heparin gtt at 1400 units/hr at 2300 today and check 6 hour level with daily cbc.  Verlene Mayer, PharmD, BCPS Pager [phone] 08/07/2012,4:21 PM

## 2012-08-07 NOTE — Progress Notes (Signed)
Utilization review completed.  

## 2012-08-07 NOTE — H&P (View-Only) (Signed)
Patient ID: George [name], male   DOB: 10/18/1945, 67 y.o.   MRN: [id number]    CARDIOLOGY CONSULT NOTE  Patient ID: George [name] MRN: [id number] DOB/AGE: 11/19/1945 67 y.o.  Primary Physician: Ashish Shah, MD Reason for Consultation: chest pain, abnormal stress test  HPI:  George [name] is a 67 y.o. Male with a PMH significant for HTN and diabetes who has been experiencing chest pain and shortness of breath with strenuous exertion. 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 It has been bothering him over the last 3 weeks. He has had some episodes of lightheadedness and dizziness. He denies palpitations. Resting alleviates his symptoms.   I interpreted a stress echocardiogram 2 days ago which showed stress-induced ischemia of the apex and anteroseptal walls, with dynamic ECG changes. His resting LV systolic function was 55-60%.  Review of systems complete and found to be negative unless listed above in HPI  Past Medical History: as per HPI   No family history on file.  History   Social History  . Marital Status: Married    Spouse Name: N/A    Number of Children: N/A  . Years of Education: N/A   Occupational History  . Not on file.   Social History Main Topics  . Smoking status: Former Smoker    Quit date: 01/15/1992  . Smokeless tobacco: Not on file  . Alcohol Use: No  . Drug Use: Not on file  . Sexually Active: Not on file   Other Topics Concern  . Not on file   Social History Narrative  . No narrative on file      Physical exam Blood pressure 144/76, pulse 74, height 6' 4" (1.93 m), weight 220 lb (99.791 kg). General: NAD Neck: No JVD, no thyromegaly or thyroid nodule.  Lungs: Clear to auscultation bilaterally with normal respiratory effort. CV: Nondisplaced PMI.  Heart regular S1/S2, no S3/S4, no murmur.  No peripheral edema.  No carotid bruit.  Normal pedal pulses.  Abdomen: Soft, nontender, no hepatosplenomegaly, no distention.   Skin: Intact without lesions or rashes.  Neurologic: Alert and oriented x 3.  Psych: Normal affect. Extremities: No clubbing or cyanosis.  HEENT: Normal.   Labs:   No results found for this basename: WBC, HGB, HCT, MCV, PLT   No results found for this basename: NA, K, CL, CO2, BUN, CREATININE, CALCIUM, LABALBU, PROT, BILITOT, ALKPHOS, ALT, AST, GLUCOSE,  in the last 168 hours No results found for this basename: CKTOTAL, CKMB, CKMBINDEX, TROPONINI    No results found for this basename: CHOL   No results found for this basename: HDL   No results found for this basename: LDLCALC   No results found for this basename: TRIG   No results found for this basename: CHOLHDL   No results found for this basename: LDLDIRECT         Stress Echo reviewed in HPI  ASSESSMENT AND PLAN:  1. Chest pain and shortness of breath, in the setting of an abnormal stress echocardiogram: I had a long discussion with the patient and his wife. His stress echo was suggestive of ischemia involving the LAD coronary arterial distribution. I have made arrangements for him to undergo cardiac catheterization at [facility]. In the meantime, I will prescribe sublingual nitroglycerin to be used as needed for chest pain. I will also start him on Lipitor 20 mg daily and Toprol-XL 25 mg daily. 2. HTN: he is   taking Clonidine, Lisinpril, and Losartan. By starting him on Toprol-XL 25 mg daily, this may help to reduce his SBP. Toprol, lipitor, sl ntg, cath Signed: Raygen Dahm, M.D., F.A.C.C. 08/05/2012, 3:17 PM  

## 2012-08-07 NOTE — CV Procedure (Signed)
   Cardiac Catheterization Procedure Note  Name: George [name] MRN: [id number] DOB: [birth year]  Procedure: Left Heart Cath, Selective Coronary Angiography, LV angiography  Indication:  Angina with abnormal stress echo   Procedural Details: The right wrist was prepped, draped, and anesthetized with 1% lidocaine. Using the modified Seldinger technique, a 5 French sheath was introduced into the right radial artery. 3 mg of verapamil was administered through the sheath, weight-based unfractionated heparin was administered intravenously. Standard Judkins catheters were used for selective coronary angiography and left ventriculography. Catheter exchanges were performed over an exchange length guidewire. There were no immediate procedural complications. A TR band was used for radial hemostasis at the completion of the procedure.  The patient was transferred to the post catheterization recovery area for further monitoring.  Procedural Findings: Hemodynamics: AO  133 71 LV 120 7 EDP 13  Coronary angiography: Coronary dominance: right  Left mainstem: 60% ,mid vessel stenosis  Left anterior descending (LAD): 50% ostial 40% mid 60-70% distal  IM:  Large vessel with 90% ostial stenosis  D1: 70% ostial  D2: noral  Left circumflex (LCx): 30% mid  OM1: large inferior brach 90%   Right coronary artery (RCA): Dominant 30% distal and PLA  Left ventriculography: Left ventricular systolic function is normal, LVEF is estimated at 55-65%, there is no significant mitral regurgitation   Final Conclusions:  Severe multivessel disease including moderate LM  Reviewed with Dr [place]  IM takes off at 90 degree angle and would not be able to be wired.    Recommendations: CABG  Charlton Haws 08/07/2012, 3:03 PM

## 2012-08-08 DIAGNOSIS — I2510 Atherosclerotic heart disease of native coronary artery without angina pectoris: Principal | ICD-10-CM

## 2012-08-08 DIAGNOSIS — Z01810 Encounter for preprocedural cardiovascular examination: Secondary | ICD-10-CM

## 2012-08-08 LAB — GLUCOSE, CAPILLARY

## 2012-08-08 LAB — CBC
HCT: 38.1 % — ABNORMAL LOW (ref 39.0–52.0)
MCH: 29 pg (ref 26.0–34.0)
MCHC: 36.2 g/dL — ABNORMAL HIGH (ref 30.0–36.0)
MCV: 80 fL (ref 78.0–100.0)
RDW: 13.4 % (ref 11.5–15.5)

## 2012-08-08 MED ORDER — ~~LOC~~ CARDIAC SURGERY, PATIENT & FAMILY EDUCATION
Freq: Once | Status: AC
  Administered 2012-08-08: 11:00:00
  Filled 2012-08-08: qty 1

## 2012-08-08 MED ORDER — ACTIVE PARTNERSHIP FOR HEALTH OF YOUR HEART BOOK
Freq: Once | Status: AC
  Administered 2012-08-08: 11:00:00
  Filled 2012-08-08: qty 1

## 2012-08-08 MED ORDER — THE SENSUOUS HEART BOOK
Freq: Once | Status: AC
  Administered 2012-08-08: 11:00:00
  Filled 2012-08-08: qty 1

## 2012-08-08 NOTE — Progress Notes (Signed)
VASCULAR LAB PRELIMINARY  PRELIMINARY  PRELIMINARY  PRELIMINARY  Pre-op Cardiac Surgery  Carotid Findings:  0-39% ICA stenosis.  Vertebral artery flow is antegrade.  Upper Extremity Right Left  Brachial Pressures    Radial Waveforms    Ulnar Waveforms    Palmar Arch (Allen's Test)     Findings:      Lower  Extremity Right Left  Dorsalis Pedis    Anterior Tibial    Posterior Tibial    Ankle/Brachial Indices      Findings:   .       George [name], RVT 08/08/2012, 11:47 AM

## 2012-08-08 NOTE — Progress Notes (Signed)
Patient ID: ZYSHONNE MALECHA, male   DOB: 05/30/1945, 67 y.o.   MRN: [id number]   Patient Name: George [name] Date of Encounter: 08/08/2012    SUBJECTIVE  No chest pain.  CURRENT MEDS . active partnership for health of your heart book   Does not apply Once  . amLODipine  10 mg Oral Daily  . aspirin EC  325 mg Oral Daily  . atorvastatin  20 mg Oral Daily  . cloNIDine  0.1 mg Oral BID  . glimepiride  4 mg Oral BID WC  . going for heart surgery book   Does not apply Once  . ibuprofen  200 mg Oral BID WC  . insulin glargine  45 Units Subcutaneous Daily  . insulin glargine  5 Units Subcutaneous QHS  . lisinopril  40 mg Oral Daily  . loratadine  10 mg Oral Daily  . losartan  50 mg Oral Daily  . [START ON 08/09/2012] metFORMIN  1,000 mg Oral BID WC  . metoprolol succinate  25 mg Oral Daily  . pantoprazole  80 mg Oral Daily  . the sensuous heart book   Does not apply Once    OBJECTIVE  Filed Vitals:   08/07/12 1630 08/07/12 1800 08/07/12 2040 08/08/12 0600  BP: 169/78 146/69 162/71 156/78  Pulse: 62 66 63 72  Temp:   98.3 F (36.8 C) 98.8 F (37.1 C)  TempSrc:   Axillary   Resp:    20  Height:      Weight:      SpO2: 97% 99% 99% 99%    Intake/Output Summary (Last 24 hours) at 08/08/12 0944 Last data filed at 08/08/12 0900  Gross per 24 hour  Intake    720 ml  Output      0 ml  Net    720 ml   Filed Weights   08/07/12 1122  Weight: 220 lb (99.791 kg)    PHYSICAL EXAM  General: Pleasant, NAD. Neuro: Alert and oriented X 3. Moves all extremities spontaneously. Psych: Normal affect. HEENT:  Normal  Neck: Supple without bruits or JVD. Lungs:  Resp regular and unlabored, CTA. Heart: RRR no s3, s4, or murmurs. Abdomen: Soft, non-tender, non-distended, BS + x 4.  Extremities: No clubbing, cyanosis or edema. DP/PT/Radials 2+ and equal bilaterally.  Accessory Clinical Findings  CBC  Recent Labs  08/07/12 1152  WBC 5.1  HGB 13.7  HCT 37.9*  MCV 79.0  PLT 194     Basic Metabolic Panel  Recent Labs  08/07/12 1152  NA 137  K 4.4  CL 100  CO2 26  GLUCOSE 198*  BUN 13  CREATININE 0.78  CALCIUM 9.2   Liver Function Tests No results found for this basename: AST, ALT, ALKPHOS, BILITOT, PROT, ALBUMIN,  in the last 72 hours No results found for this basename: LIPASE, AMYLASE,  in the last 72 hours Cardiac Enzymes No results found for this basename: CKTOTAL, CKMB, CKMBINDEX, TROPONINI,  in the last 72 hours BNP No components found with this basename: POCBNP,  D-Dimer No results found for this basename: DDIMER,  in the last 72 hours Hemoglobin A1C No results found for this basename: HGBA1C,  in the last 72 hours Fasting Lipid Panel No results found for this basename: CHOL, HDL, LDLCALC, TRIG, CHOLHDL, LDLDIRECT,  in the last 72 hours Thyroid Function Tests No results found for this basename: TSH, T4TOTAL, FREET3, T3FREE, THYROIDAB,  in the last 72 hours  TELE  Normal sinus rhythm  ECG    Radiology/Studies  No results found.  ASSESSMENT AND PLAN Stable exertional angina with severe three-vessel coronary artery disease by catheterization yesterday. Schedule for elective coronary bypass grafting on Thursday, July 31. He is stable and can be transferred home. He's been instructed on taking it easy and resting. All questions answered with patient and family.   Signed, Valera Castle MD

## 2012-08-08 NOTE — Discharge Summary (Signed)
Physician Discharge Summary  Patient ID: George [name] MRN: [id number] DOB/AGE: 09/09/1945 67 y.o.  Admit date: 08/07/2012 Discharge date: 08/08/2012  Primary Cardiologist: Prentice Docker, MD   Primary Discharge Diagnosis:  1 CAD  - Severe, 3v CAD, awaiting elective CABG, 08/13/2012  - NL LVF (EF 55-65%)  Secondary Discharge Diagnoses: Past Medical History  Diagnosis Date  . Unspecified essential hypertension   . Generalized osteoarthrosis, unspecified site   . Pure hypercholesterolemia   . Herpes zoster without mention of complication   . Diabetic neuropathy   . Psoriasis and similar disorder   . Sinusitis   . Lumbago   . Chest pain   . Acute upper respiratory infections of unspecified site   . Dermatophytosis of the body   . Pleurisy without mention of effusion or current tuberculosis   . Colitis, enteritis, and gastroenteritis of presumed infectious origin   . Joint pain   . Other dyspnea and respiratory abnormality      Procedures: Cardiac Catheterization, 08/07/2012: Procedural Findings:  Hemodynamics:  AO 133 71  LV 120 7 EDP 13  Coronary angiography:  Coronary dominance: right  Left mainstem: 60% ,mid vessel stenosis  Left anterior descending (LAD): 50% ostial 40% mid 60-70% distal  IM: Large vessel with 90% ostial stenosis  D1: 70% ostial  D2: noral  Left circumflex (LCx): 30% mid  OM1: large inferior brach 90%  Right coronary artery (RCA): Dominant 30% distal and PLA  Left ventriculography: Left ventricular systolic function is normal, LVEF is estimated at 55-65%, there is no significant mitral regurgitation  Final Conclusions: Severe multivessel disease including moderate LM Reviewed with Dr [place] IM takes off at 90 degree angle and would not be able to be wired.  Recommendations: CABG     Hospital Course: Patient presented for elective cardiac catheterization, per Dr. Junius Argyle recommendation, for further evaluation of angina pectoris, in the  setting of a recent abnormal stress echocardiogram.  Patient underwent same-day procedure, by Dr. Eden Emms, with no noted complications. He was found to have severe multivessel CAD, including moderate left main disease (60%), and NL LVF. He was referred for surgical evaluation and was seen by Dr. Kerin Perna, who recommended proceeding with CABG. Arrangements were made for elective surgery on July 31.  Patient was cleared for discharge the following morning, in hemodynamically stable condition.   Discharge Vitals: Blood pressure 156/78, pulse 72, temperature 98.8 F (37.1 C), temperature source Axillary, resp. rate 20, height 6\' 4"  (1.93 m), weight 220 lb (99.791 kg), SpO2 99.00%.  Labs: Lab Results  Component Value Date   WBC 5.1 08/07/2012   HGB 13.7 08/07/2012   HCT 37.9* 08/07/2012   MCV 79.0 08/07/2012   PLT 194 08/07/2012      Recent Labs Lab 08/07/12 1152  NA 137  K 4.4  CL 100  CO2 26  BUN 13  CREATININE 0.78  CALCIUM 9.2  GLUCOSE 198*    No results found for this basename: CHOL,  HDL,  LDLCALC,  TRIG    No results found for this basename: DDIMER    No results found for this basename: TSH    No results found for this basename: CKTOTAL, CKMB, TROPONINI,  in the last 72 hours  Diagnostic Studies: No results found.   DISPOSITION: Stable condition  FOLLOW UP PLANS AND APPOINTMENTS:     Discharge Orders   Future Appointments Provider Department Dept Phone   08/10/2012 8:00 AM [facility] [phone]  Future Orders Complete By Expires     Diet - low sodium heart healthy  As directed     Increase activity slowly  As directed         DISCHARGE MEDICATIONS:   Medication List         amLODipine 10 MG tablet  Commonly known as:  NORVASC  Take 10 mg by mouth daily.     aspirin EC 81 MG tablet  Take 81 mg by mouth daily.     atorvastatin 20 MG tablet  Commonly known as:  LIPITOR  Take 20 mg by  mouth daily.     cloNIDine 0.1 MG tablet  Commonly known as:  CATAPRES  Take 0.1 mg by mouth 2 (two) times daily.     fexofenadine 180 MG tablet  Commonly known as:  ALLEGRA  Take 180 mg by mouth at bedtime.     fluticasone 50 MCG/ACT nasal spray  Commonly known as:  FLONASE  Place 2 sprays into the nose daily as needed for rhinitis or allergies.     glimepiride 4 MG tablet  Commonly known as:  AMARYL  Take 4 mg by mouth 2 (two) times daily.     ibuprofen 200 MG tablet  Commonly known as:  ADVIL,MOTRIN  Take 200 mg by mouth 2 (two) times daily.     insulin glargine 100 UNIT/ML injection  Commonly known as:  LANTUS  Inject 5-45 Units into the skin 2 (two) times daily. 45 units in the morning and 5-15 units at bedtime per sliding scale     lisinopril 40 MG tablet  Commonly known as:  PRINIVIL,ZESTRIL  Take 40 mg by mouth daily.     losartan 100 MG tablet  Commonly known as:  COZAAR  Take 100 mg by mouth daily.     metFORMIN 500 MG tablet  Commonly known as:  GLUCOPHAGE  Take 1,000 mg by mouth 2 (two) times daily with a meal. Takes two tablets twice daily     metoprolol succinate 25 MG 24 hr tablet  Commonly known as:  TOPROL-XL  Take 25 mg by mouth daily.     nitroGLYCERIN 0.4 MG SL tablet  Commonly known as:  NITROSTAT  Place 0.4 mg under the tongue every 5 (five) minutes as needed for chest pain.     omeprazole 20 MG capsule  Commonly known as:  PRILOSEC  Take 20 mg by mouth daily.        BRING ALL MEDICATIONS WITH YOU TO FOLLOW UP APPOINTMENTS  Time spent with patient to include physician time: Greater than 30 minutes, including physician time.  SignedPrescott Parma 08/08/2012, 11:06 AM Co-Sign MD  Jesse Sans. Daleen Squibb, MD, [facility] Pager:  [phone]

## 2012-08-10 ENCOUNTER — Other Ambulatory Visit (HOSPITAL_COMMUNITY): Payer: Self-pay

## 2012-08-10 ENCOUNTER — Ambulatory Visit (HOSPITAL_COMMUNITY)
Admit: 2012-08-10 | Discharge: 2012-08-10 | Disposition: A | Discharge status: Home / Self Care | Payer: Medicare Other | Attending: Cardiothoracic Surgery | Admitting: Cardiothoracic Surgery

## 2012-08-10 DIAGNOSIS — E1149 Type 2 diabetes mellitus with other diabetic neurological complication: Secondary | ICD-10-CM | POA: Diagnosis present

## 2012-08-10 DIAGNOSIS — E7800 Pure hypercholesterolemia, unspecified: Secondary | ICD-10-CM | POA: Diagnosis present

## 2012-08-10 DIAGNOSIS — R0609 Other forms of dyspnea: Secondary | ICD-10-CM | POA: Insufficient documentation

## 2012-08-10 DIAGNOSIS — Z6826 Body mass index (BMI) 26.0-26.9, adult: Secondary | ICD-10-CM

## 2012-08-10 DIAGNOSIS — E1142 Type 2 diabetes mellitus with diabetic polyneuropathy: Secondary | ICD-10-CM | POA: Diagnosis present

## 2012-08-10 DIAGNOSIS — R0989 Other specified symptoms and signs involving the circulatory and respiratory systems: Secondary | ICD-10-CM | POA: Insufficient documentation

## 2012-08-10 DIAGNOSIS — M1990 Unspecified osteoarthritis, unspecified site: Secondary | ICD-10-CM | POA: Diagnosis present

## 2012-08-10 DIAGNOSIS — Z794 Long term (current) use of insulin: Secondary | ICD-10-CM

## 2012-08-10 DIAGNOSIS — I359 Nonrheumatic aortic valve disorder, unspecified: Secondary | ICD-10-CM | POA: Diagnosis present

## 2012-08-10 DIAGNOSIS — Z87891 Personal history of nicotine dependence: Secondary | ICD-10-CM

## 2012-08-10 DIAGNOSIS — I209 Angina pectoris, unspecified: Secondary | ICD-10-CM | POA: Diagnosis present

## 2012-08-10 DIAGNOSIS — Z01811 Encounter for preprocedural respiratory examination: Secondary | ICD-10-CM | POA: Insufficient documentation

## 2012-08-10 DIAGNOSIS — D62 Acute posthemorrhagic anemia: Secondary | ICD-10-CM | POA: Diagnosis not present

## 2012-08-10 DIAGNOSIS — E8779 Other fluid overload: Secondary | ICD-10-CM | POA: Diagnosis not present

## 2012-08-10 DIAGNOSIS — E669 Obesity, unspecified: Secondary | ICD-10-CM | POA: Diagnosis present

## 2012-08-10 DIAGNOSIS — I2510 Atherosclerotic heart disease of native coronary artery without angina pectoris: Principal | ICD-10-CM | POA: Diagnosis present

## 2012-08-10 DIAGNOSIS — I10 Essential (primary) hypertension: Secondary | ICD-10-CM | POA: Diagnosis present

## 2012-08-10 DIAGNOSIS — I498 Other specified cardiac arrhythmias: Secondary | ICD-10-CM | POA: Diagnosis not present

## 2012-08-10 LAB — PULMONARY FUNCTION TEST

## 2012-08-11 ENCOUNTER — Other Ambulatory Visit (HOSPITAL_COMMUNITY): Payer: Self-pay

## 2012-08-11 ENCOUNTER — Encounter (HOSPITAL_COMMUNITY): Payer: Self-pay

## 2012-08-11 ENCOUNTER — Encounter (HOSPITAL_COMMUNITY)
Admission: RE | Admit: 2012-08-11 | Discharge: 2012-08-11 | Disposition: A | Discharge status: Home / Self Care | Payer: Medicare Other | Source: Ambulatory Visit | Attending: Cardiothoracic Surgery | Admitting: Cardiothoracic Surgery

## 2012-08-11 ENCOUNTER — Ambulatory Visit (HOSPITAL_COMMUNITY)
Admission: RE | Admit: 2012-08-11 | Discharge: 2012-08-11 | Disposition: A | Discharge status: Home / Self Care | Payer: Medicare Other | Source: Ambulatory Visit | Attending: Cardiothoracic Surgery | Admitting: Cardiothoracic Surgery

## 2012-08-11 ENCOUNTER — Other Ambulatory Visit (HOSPITAL_COMMUNITY): Payer: Self-pay | Admitting: Cardiothoracic Surgery

## 2012-08-11 VITALS — BP 150/84 | HR 56 | Temp 97.8°F | Resp 20 | Ht 73.0 in | Wt 220.9 lb

## 2012-08-11 DIAGNOSIS — I2510 Atherosclerotic heart disease of native coronary artery without angina pectoris: Secondary | ICD-10-CM | POA: Insufficient documentation

## 2012-08-11 DIAGNOSIS — Z01818 Encounter for other preprocedural examination: Secondary | ICD-10-CM | POA: Insufficient documentation

## 2012-08-11 DIAGNOSIS — R079 Chest pain, unspecified: Secondary | ICD-10-CM

## 2012-08-11 DIAGNOSIS — Z01812 Encounter for preprocedural laboratory examination: Secondary | ICD-10-CM | POA: Insufficient documentation

## 2012-08-11 DIAGNOSIS — Z01810 Encounter for preprocedural cardiovascular examination: Secondary | ICD-10-CM

## 2012-08-11 DIAGNOSIS — I10 Essential (primary) hypertension: Secondary | ICD-10-CM | POA: Insufficient documentation

## 2012-08-11 HISTORY — DX: Gastro-esophageal reflux disease without esophagitis: K21.9

## 2012-08-11 HISTORY — DX: Atherosclerotic heart disease of native coronary artery without angina pectoris: I25.10

## 2012-08-11 HISTORY — DX: Personal history of other diseases of the respiratory system: Z87.09

## 2012-08-11 HISTORY — DX: Disorientation, unspecified: R41.0

## 2012-08-11 HISTORY — DX: Dizziness and giddiness: R42

## 2012-08-11 HISTORY — DX: Type 2 diabetes mellitus without complications: E11.9

## 2012-08-11 HISTORY — DX: Dorsalgia, unspecified: M54.9

## 2012-08-11 HISTORY — DX: Other chronic pain: G89.29

## 2012-08-11 LAB — BLOOD GAS, ARTERIAL
Acid-base deficit: 0.6 mmol/L (ref 0.0–2.0)
Bicarbonate: 23.4 mEq/L (ref 20.0–24.0)
Drawn by: 344381
FIO2: 0.21 %
O2 Saturation: 97.4 %
Patient temperature: 98.6
TCO2: 24.6 mmol/L (ref 0–100)
pCO2 arterial: 37.8 mmHg (ref 35.0–45.0)
pH, Arterial: 7.409 (ref 7.350–7.450)
pO2, Arterial: 88.2 mmHg (ref 80.0–100.0)

## 2012-08-11 LAB — COMPREHENSIVE METABOLIC PANEL
ALT: 44 U/L (ref 0–53)
AST: 28 U/L (ref 0–37)
Albumin: 3.8 g/dL (ref 3.5–5.2)
Alkaline Phosphatase: 100 U/L (ref 39–117)
BUN: 12 mg/dL (ref 6–23)
CO2: 21 mEq/L (ref 19–32)
Calcium: 9.7 mg/dL (ref 8.4–10.5)
Chloride: 102 mEq/L (ref 96–112)
Creatinine, Ser: 0.63 mg/dL (ref 0.50–1.35)
GFR calc Af Amer: >90 mL/min (ref >90)
GFR calc non Af Amer: >90 mL/min (ref >90)
Glucose, Bld: 139 mg/dL — ABNORMAL HIGH (ref 70–99)
Potassium: 4 mEq/L (ref 3.5–5.1)
Sodium: 136 mEq/L (ref 135–145)
Total Bilirubin: 0.3 mg/dL (ref 0.3–1.2)
Total Protein: 6.9 g/dL (ref 6.0–8.3)

## 2012-08-11 LAB — CBC
HCT: 39.2 % (ref 39.0–52.0)
Hemoglobin: 13.9 g/dL (ref 13.0–17.0)
MCH: 28.4 pg (ref 26.0–34.0)
MCHC: 35.5 g/dL (ref 30.0–36.0)
MCV: 80.2 fL (ref 78.0–100.0)
Platelets: 169 10*3/uL (ref 150–400)
RBC: 4.89 MIL/uL (ref 4.22–5.81)
RDW: 13.5 % (ref 11.5–15.5)
WBC: 5.4 10*3/uL (ref 4.0–10.5)

## 2012-08-11 LAB — URINALYSIS, ROUTINE W REFLEX MICROSCOPIC
Bilirubin Urine: NEGATIVE
Glucose, UA: 100 mg/dL — AB
Hgb urine dipstick: NEGATIVE
Ketones, ur: NEGATIVE mg/dL
Leukocytes, UA: NEGATIVE
Nitrite: NEGATIVE
Protein, ur: NEGATIVE mg/dL
Specific Gravity, Urine: 1.02 (ref 1.005–1.030)
Urobilinogen, UA: 0.2 mg/dL (ref 0.0–1.0)
pH: 5.5 (ref 5.0–8.0)

## 2012-08-11 LAB — PROTIME-INR
INR: 0.96 (ref 0.00–1.49)
Prothrombin Time: 12.6 seconds (ref 11.6–15.2)

## 2012-08-11 LAB — SURGICAL PCR SCREEN
MRSA, PCR: NEGATIVE
Staphylococcus aureus: NEGATIVE

## 2012-08-11 LAB — APTT: aPTT: 22 seconds — ABNORMAL LOW (ref 24–37)

## 2012-08-11 LAB — ABO/RH: ABO/RH(D): O POS

## 2012-08-11 LAB — HEMOGLOBIN A1C
Hgb A1c MFr Bld: 7.8 % — ABNORMAL HIGH (ref <5.7)
Mean Plasma Glucose: 177 mg/dL — ABNORMAL HIGH (ref <117)

## 2012-08-11 MED ORDER — CHLORHEXIDINE GLUCONATE 4 % EX LIQD: 30.0000 mL | CUTANEOUS | Status: DC

## 2012-08-11 NOTE — Progress Notes (Signed)
VASCULAR LAB PRELIMINARY  PRELIMINARY  PRELIMINARY  PRELIMINARY  Pre-op Cardiac Surgery  Carotid Findings:  0-39% ICA stenosis.  Vertebral artery flow is antegrade.  Upper Extremity Right Left  Brachial Pressures  164 Triohasic 151 Triphasic  Radial Waveforms Triphasic Triphasic  Ulnar Waveforms Triphasic Triphasic  Palmar Arch (Allen's Test) Abnormal Abnormal   Findings:  Palmar arch evaluation Right - Doppler waveforms obliterate with radial compression and remain normal with ulnar compression. Left - Doppler waveforms reverse with radial compression and remain normal with ulnar compression.    Lower  Extremity Right Left  Dorsalis Pedis 189 Biphasic 178 Triphasic  Posterior Tibial 190 Triphasic 183 Triphasic  Ankle/Brachial Indices 1.16 1.12   Findings:     ABIs and Doppler waveforms are within normal limits bilaterally at rest.  Aerilyn Slee, RVS 08/11/2012, 6:08 PM

## 2012-08-11 NOTE — Pre-Procedure Instructions (Signed)
George [name]  08/11/2012   Your procedure is scheduled on:  Thurs, July 31 @ 7:30 AM  Report to [facility] at 5:30 AM.  Call this number if you have problems the morning of surgery: [phone]   Remember:   Do not eat food or drink liquids after midnight.   Take these medicines the morning of surgery with A SIP OF WATER: Amlodipine(Norvasc),Catapres(Clonidine),Flonase(Fluticasone),Metoprolol(Toprol),and Omeprazole(Prilosec)              No Goody's,BC's,Fish Oil,Ibuprofen,Aleve,Aspirin,or any Herbal Medications   Do not wear jewelry  Do not wear lotions, powders, or colognes.You may wear deodorant.  Men may shave face and neck.  Do not bring valuables to the hospital.  [facility] is not responsible                   for any belongings or valuables.  Contacts, dentures or bridgework may not be worn into surgery.  Leave suitcase in the car. After surgery it may be brought to your room.  For patients admitted to the hospital, checkout time is 11:00 AM the day of  discharge.     Special Instructions: Shower using CHG 2 nights before surgery and the night before surgery.  If you shower the day of surgery use CHG.  Use special wash - you have one bottle of CHG for all showers.  You should use approximately 1/3 of the bottle for each shower.   Please read over the following fact sheets that you were given: Pain Booklet, Coughing and Deep Breathing, Blood Transfusion Information, MRSA Information and Surgical Site Infection Prevention

## 2012-08-11 NOTE — Progress Notes (Signed)
Dr.Koneswaran is cardiologist with Dr.Labuaer   Echo/Stress test report in epic from 08-03-12  Heart cath report in epic from 08-07-12  Dr.Shah in [place] is Medical Md  EKG report in epic from 08-09-12  DEnies cxr being done within the last yr

## 2012-08-12 MED ORDER — DEXMEDETOMIDINE HCL IN NACL 400 MCG/100ML IV SOLN
0.1000 ug/kg/h | INTRAVENOUS | Status: AC
  Administered 2012-08-13: .7 ug/kg/h via INTRAVENOUS
  Filled 2012-08-12: qty 100

## 2012-08-12 MED ORDER — EPINEPHRINE HCL 1 MG/ML IJ SOLN
0.5000 ug/min | INTRAVENOUS | Status: DC
  Filled 2012-08-12: qty 4

## 2012-08-12 MED ORDER — PHENYLEPHRINE HCL 10 MG/ML IJ SOLN
30.0000 ug/min | INTRAVENOUS | Status: AC
  Administered 2012-08-13: 25 ug/min via INTRAVENOUS
  Filled 2012-08-12: qty 2

## 2012-08-12 MED ORDER — MAGNESIUM SULFATE 50 % IJ SOLN
40.0000 meq | INTRAMUSCULAR | Status: DC
  Filled 2012-08-12: qty 10

## 2012-08-12 MED ORDER — PLASMA-LYTE 148 IV SOLN
INTRAVENOUS | Status: AC
  Administered 2012-08-13: 09:00:00
  Filled 2012-08-12: qty 2.5

## 2012-08-12 MED ORDER — DEXTROSE 5 % IV SOLN
1.5000 g | INTRAVENOUS | Status: AC
  Administered 2012-08-13: 1.5 g via INTRAVENOUS
  Administered 2012-08-13: .75 g via INTRAVENOUS
  Filled 2012-08-12 (×2): qty 1.5

## 2012-08-12 MED ORDER — SODIUM CHLORIDE 0.9 % IV SOLN
INTRAVENOUS | Status: DC
  Filled 2012-08-12: qty 30

## 2012-08-12 MED ORDER — DEXTROSE 5 % IV SOLN
750.0000 mg | INTRAVENOUS | Status: DC
  Filled 2012-08-12: qty 750

## 2012-08-12 MED ORDER — DOPAMINE-DEXTROSE 3.2-5 MG/ML-% IV SOLN
2.0000 ug/kg/min | INTRAVENOUS | Status: DC
  Filled 2012-08-12: qty 250

## 2012-08-12 MED ORDER — METOPROLOL TARTRATE 12.5 MG HALF TABLET: 12.5000 mg | ORAL_TABLET | Freq: Once | ORAL | Status: DC

## 2012-08-12 MED ORDER — SODIUM CHLORIDE 0.9 % IV SOLN
INTRAVENOUS | Status: AC
  Administered 2012-08-13: 5 [IU]/h via INTRAVENOUS
  Filled 2012-08-12 (×2): qty 1

## 2012-08-12 MED ORDER — POTASSIUM CHLORIDE 2 MEQ/ML IV SOLN
80.0000 meq | INTRAVENOUS | Status: DC
  Filled 2012-08-12: qty 40

## 2012-08-12 MED ORDER — SODIUM CHLORIDE 0.9 % IV SOLN
INTRAVENOUS | Status: AC
  Administered 2012-08-13: 69 mL/h via INTRAVENOUS
  Filled 2012-08-12: qty 40

## 2012-08-12 MED ORDER — NITROGLYCERIN IN D5W 200-5 MCG/ML-% IV SOLN
2.0000 ug/min | INTRAVENOUS | Status: AC
  Administered 2012-08-13: 6.7 ug/min via INTRAVENOUS
  Filled 2012-08-12: qty 250

## 2012-08-12 MED ORDER — VANCOMYCIN HCL 10 G IV SOLR
1500.0000 mg | INTRAVENOUS | Status: AC
  Administered 2012-08-13: 1500 mg via INTRAVENOUS
  Filled 2012-08-12: qty 1500

## 2012-08-13 ENCOUNTER — Inpatient Hospital Stay (HOSPITAL_COMMUNITY): Payer: Medicare Other

## 2012-08-13 ENCOUNTER — Inpatient Hospital Stay (HOSPITAL_COMMUNITY)
Admission: RE | Admit: 2012-08-13 | Discharge: 2012-08-18 | DRG: 236 | Disposition: A | Discharge status: Home / Self Care | Payer: Medicare Other | Source: Ambulatory Visit | Attending: Cardiothoracic Surgery | Admitting: Cardiothoracic Surgery

## 2012-08-13 ENCOUNTER — Encounter (HOSPITAL_COMMUNITY): Payer: Self-pay | Admitting: Certified Registered"

## 2012-08-13 ENCOUNTER — Inpatient Hospital Stay (HOSPITAL_COMMUNITY): Payer: Medicare Other | Admitting: Certified Registered"

## 2012-08-13 ENCOUNTER — Encounter (HOSPITAL_COMMUNITY): Payer: Self-pay | Admitting: *Deleted

## 2012-08-13 ENCOUNTER — Encounter (HOSPITAL_COMMUNITY)
Admission: RE | Disposition: A | Discharge status: Home / Self Care | Payer: Self-pay | Source: Ambulatory Visit | Attending: Cardiothoracic Surgery

## 2012-08-13 DIAGNOSIS — I2510 Atherosclerotic heart disease of native coronary artery without angina pectoris: Secondary | ICD-10-CM

## 2012-08-13 DIAGNOSIS — Z951 Presence of aortocoronary bypass graft: Secondary | ICD-10-CM

## 2012-08-13 DIAGNOSIS — I10 Essential (primary) hypertension: Secondary | ICD-10-CM

## 2012-08-13 DIAGNOSIS — R079 Chest pain, unspecified: Secondary | ICD-10-CM

## 2012-08-13 HISTORY — PX: ENDOVEIN HARVEST OF GREATER SAPHENOUS VEIN: SHX5059

## 2012-08-13 HISTORY — PX: CORONARY ARTERY BYPASS GRAFT: SHX141

## 2012-08-13 HISTORY — PX: INTRAOPERATIVE TRANSESOPHAGEAL ECHOCARDIOGRAM: SHX5062

## 2012-08-13 LAB — GLUCOSE, CAPILLARY
Glucose-Capillary: 215 mg/dL — ABNORMAL HIGH (ref 70–99)
Glucose-Capillary: 49 mg/dL — ABNORMAL LOW (ref 70–99)
Glucose-Capillary: 84 mg/dL (ref 70–99)
Glucose-Capillary: 86 mg/dL (ref 70–99)
Glucose-Capillary: 86 mg/dL (ref 70–99)
Glucose-Capillary: 136 mg/dL — ABNORMAL HIGH (ref 70–99)
Glucose-Capillary: 132 mg/dL — ABNORMAL HIGH (ref 70–99)
Glucose-Capillary: 143 mg/dL — ABNORMAL HIGH (ref 70–99)

## 2012-08-13 LAB — POCT I-STAT 4, (NA,K, GLUC, HGB,HCT)
Glucose, Bld: 226 mg/dL — ABNORMAL HIGH (ref 70–99)
Glucose, Bld: 154 mg/dL — ABNORMAL HIGH (ref 70–99)
Glucose, Bld: 181 mg/dL — ABNORMAL HIGH (ref 70–99)
Glucose, Bld: 139 mg/dL — ABNORMAL HIGH (ref 70–99)
Glucose, Bld: 155 mg/dL — ABNORMAL HIGH (ref 70–99)
Glucose, Bld: 111 mg/dL — ABNORMAL HIGH (ref 70–99)
HCT: 38 % — ABNORMAL LOW (ref 39.0–52.0)
HCT: 27 % — ABNORMAL LOW (ref 39.0–52.0)
HCT: 33 % — ABNORMAL LOW (ref 39.0–52.0)
HCT: 26 % — ABNORMAL LOW (ref 39.0–52.0)
HCT: 26 % — ABNORMAL LOW (ref 39.0–52.0)
HCT: 27 % — ABNORMAL LOW (ref 39.0–52.0)
Hemoglobin: 12.9 g/dL — ABNORMAL LOW (ref 13.0–17.0)
Hemoglobin: 11.2 g/dL — ABNORMAL LOW (ref 13.0–17.0)
Hemoglobin: 9.2 g/dL — ABNORMAL LOW (ref 13.0–17.0)
Hemoglobin: 8.8 g/dL — ABNORMAL LOW (ref 13.0–17.0)
Hemoglobin: 8.8 g/dL — ABNORMAL LOW (ref 13.0–17.0)
Hemoglobin: 9.2 g/dL — ABNORMAL LOW (ref 13.0–17.0)
Potassium: 5 mEq/L (ref 3.5–5.1)
Potassium: 4 mEq/L (ref 3.5–5.1)
Potassium: 4.3 mEq/L (ref 3.5–5.1)
Potassium: 4.6 mEq/L (ref 3.5–5.1)
Potassium: 4 mEq/L (ref 3.5–5.1)
Potassium: 3.6 mEq/L (ref 3.5–5.1)
Sodium: 138 mEq/L (ref 135–145)
Sodium: 137 mEq/L (ref 135–145)
Sodium: 139 mEq/L (ref 135–145)
Sodium: 136 mEq/L (ref 135–145)
Sodium: 136 mEq/L (ref 135–145)
Sodium: 139 mEq/L (ref 135–145)

## 2012-08-13 LAB — POCT I-STAT 3, ART BLOOD GAS (G3+)
Acid-Base Excess: 1 mmol/L (ref 0.0–2.0)
Acid-Base Excess: 1 mmol/L (ref 0.0–2.0)
Acid-base deficit: 2 mmol/L (ref 0.0–2.0)
Acid-base deficit: 1 mmol/L (ref 0.0–2.0)
Bicarbonate: 26.7 mEq/L — ABNORMAL HIGH (ref 20.0–24.0)
Bicarbonate: 25.6 mEq/L — ABNORMAL HIGH (ref 20.0–24.0)
Bicarbonate: 25 mEq/L — ABNORMAL HIGH (ref 20.0–24.0)
Bicarbonate: 23.7 mEq/L (ref 20.0–24.0)
Bicarbonate: 25.2 mEq/L — ABNORMAL HIGH (ref 20.0–24.0)
O2 Saturation: 100 %
O2 Saturation: 100 %
O2 Saturation: 97 %
O2 Saturation: 96 %
O2 Saturation: 96 %
Patient temperature: 35.7
Patient temperature: 37.7
Patient temperature: 37.7
TCO2: 28 mmol/L (ref 0–100)
TCO2: 27 mmol/L (ref 0–100)
TCO2: 26 mmol/L (ref 0–100)
TCO2: 25 mmol/L (ref 0–100)
TCO2: 27 mmol/L (ref 0–100)
pCO2 arterial: 49.6 mmHg — ABNORMAL HIGH (ref 35.0–45.0)
pCO2 arterial: 42.1 mmHg (ref 35.0–45.0)
pCO2 arterial: 40.1 mmHg (ref 35.0–45.0)
pCO2 arterial: 45.6 mmHg — ABNORMAL HIGH (ref 35.0–45.0)
pCO2 arterial: 46.4 mmHg — ABNORMAL HIGH (ref 35.0–45.0)
pH, Arterial: 7.339 — ABNORMAL LOW (ref 7.350–7.450)
pH, Arterial: 7.393 (ref 7.350–7.450)
pH, Arterial: 7.397 (ref 7.350–7.450)
pH, Arterial: 7.328 — ABNORMAL LOW (ref 7.350–7.450)
pH, Arterial: 7.346 — ABNORMAL LOW (ref 7.350–7.450)
pO2, Arterial: 202 mmHg — ABNORMAL HIGH (ref 80.0–100.0)
pO2, Arterial: 284 mmHg — ABNORMAL HIGH (ref 80.0–100.0)
pO2, Arterial: 82 mmHg (ref 80.0–100.0)
pO2, Arterial: 90 mmHg (ref 80.0–100.0)
pO2, Arterial: 92 mmHg (ref 80.0–100.0)

## 2012-08-13 LAB — CREATININE, SERUM
Creatinine, Ser: 0.71 mg/dL (ref 0.50–1.35)
GFR calc Af Amer: >90 mL/min (ref >90)
GFR calc non Af Amer: >90 mL/min (ref >90)

## 2012-08-13 LAB — CBC
HCT: 30.2 % — ABNORMAL LOW (ref 39.0–52.0)
HCT: 33.2 % — ABNORMAL LOW (ref 39.0–52.0)
Hemoglobin: 10.9 g/dL — ABNORMAL LOW (ref 13.0–17.0)
Hemoglobin: 11.8 g/dL — ABNORMAL LOW (ref 13.0–17.0)
MCH: 28.5 pg (ref 26.0–34.0)
MCH: 28.4 pg (ref 26.0–34.0)
MCHC: 35.5 g/dL (ref 30.0–36.0)
MCV: 78.9 fL (ref 78.0–100.0)
MCV: 79.8 fL (ref 78.0–100.0)
Platelets: 185 10*3/uL (ref 150–400)
RBC: 3.83 MIL/uL — ABNORMAL LOW (ref 4.22–5.81)
RBC: 4.16 MIL/uL — ABNORMAL LOW (ref 4.22–5.81)
RDW: 13.6 % (ref 11.5–15.5)
WBC: 14.5 10*3/uL — ABNORMAL HIGH (ref 4.0–10.5)

## 2012-08-13 LAB — POCT I-STAT, CHEM 8
BUN: 10 mg/dL (ref 6–23)
Calcium, Ion: 1.19 mmol/L (ref 1.13–1.30)
Chloride: 104 mEq/L (ref 96–112)
Creatinine, Ser: 0.9 mg/dL (ref 0.50–1.35)
Glucose, Bld: 148 mg/dL — ABNORMAL HIGH (ref 70–99)
HCT: 34 % — ABNORMAL LOW (ref 39.0–52.0)
Hemoglobin: 11.6 g/dL — ABNORMAL LOW (ref 13.0–17.0)
Potassium: 4.7 mEq/L (ref 3.5–5.1)
Sodium: 141 mEq/L (ref 135–145)
TCO2: 24 mmol/L (ref 0–100)

## 2012-08-13 LAB — PLATELET COUNT: Platelets: 129 10*3/uL — ABNORMAL LOW (ref 150–400)

## 2012-08-13 LAB — MAGNESIUM: Magnesium: 2.6 mg/dL — ABNORMAL HIGH (ref 1.5–2.5)

## 2012-08-13 LAB — HEMOGLOBIN AND HEMATOCRIT, BLOOD
HCT: 24.6 % — ABNORMAL LOW (ref 39.0–52.0)
Hemoglobin: 8.9 g/dL — ABNORMAL LOW (ref 13.0–17.0)

## 2012-08-13 SURGERY — CORONARY ARTERY BYPASS GRAFTING (CABG)
Anesthesia: General | Site: Leg Upper | Laterality: Right | Wound class: Clean

## 2012-08-13 MED ORDER — DEXMEDETOMIDINE HCL IN NACL 400 MCG/100ML IV SOLN
0.1000 ug/kg/h | INTRAVENOUS | Status: DC
  Filled 2012-08-13: qty 100

## 2012-08-13 MED ORDER — ACETAMINOPHEN 160 MG/5ML PO SOLN: 650.0000 mg | Freq: Once | ORAL | Status: AC

## 2012-08-13 MED ORDER — SODIUM CHLORIDE 0.9 % IV SOLN
INTRAVENOUS | Status: DC
  Administered 2012-08-13: 19:00:00 via INTRAVENOUS
  Filled 2012-08-13 (×2): qty 1

## 2012-08-13 MED ORDER — SODIUM CHLORIDE 0.9 % IJ SOLN
3.0000 mL | Freq: Two times a day (BID) | INTRAMUSCULAR | Status: DC
  Administered 2012-08-14 – 2012-08-15 (×3): 3 mL via INTRAVENOUS

## 2012-08-13 MED ORDER — VANCOMYCIN HCL IN DEXTROSE 1-5 GM/200ML-% IV SOLN
1000.0000 mg | Freq: Once | INTRAVENOUS | Status: AC
  Administered 2012-08-13: 1000 mg via INTRAVENOUS
  Filled 2012-08-13: qty 200

## 2012-08-13 MED ORDER — SODIUM CHLORIDE 0.9 % IV SOLN
INTRAVENOUS | Status: DC
  Administered 2012-08-13: 10 mL/h via INTRAVENOUS

## 2012-08-13 MED ORDER — BISACODYL 5 MG PO TBEC
10.0000 mg | DELAYED_RELEASE_TABLET | Freq: Every day | ORAL | Status: DC
  Administered 2012-08-14 – 2012-08-15 (×2): 10 mg via ORAL
  Filled 2012-08-13 (×2): qty 2

## 2012-08-13 MED ORDER — DOPAMINE-DEXTROSE 3.2-5 MG/ML-% IV SOLN: 0.0000 ug/kg/min | INTRAVENOUS | Status: DC

## 2012-08-13 MED ORDER — MORPHINE SULFATE 2 MG/ML IJ SOLN
1.0000 mg | INTRAMUSCULAR | Status: AC | PRN
  Administered 2012-08-13: 2 mg via INTRAVENOUS
  Administered 2012-08-14: 4 mg via INTRAVENOUS
  Filled 2012-08-13: qty 2
  Filled 2012-08-13: qty 1

## 2012-08-13 MED ORDER — SODIUM CHLORIDE 0.9 % IJ SOLN
3.0000 mL | INTRAMUSCULAR | Status: DC | PRN
  Administered 2012-08-15: 3 mL via INTRAVENOUS

## 2012-08-13 MED ORDER — FAMOTIDINE IN NACL 20-0.9 MG/50ML-% IV SOLN
20.0000 mg | Freq: Two times a day (BID) | INTRAVENOUS | Status: AC
  Administered 2012-08-13: 20 mg via INTRAVENOUS

## 2012-08-13 MED ORDER — ASPIRIN 81 MG PO CHEW: 324.0000 mg | CHEWABLE_TABLET | Freq: Every day | ORAL | Status: DC

## 2012-08-13 MED ORDER — SODIUM CHLORIDE 0.9 % IV SOLN
5.0000 g/h | Freq: Once | INTRAVENOUS | Status: DC
  Filled 2012-08-13: qty 20

## 2012-08-13 MED ORDER — ASPIRIN EC 325 MG PO TBEC
325.0000 mg | DELAYED_RELEASE_TABLET | Freq: Every day | ORAL | Status: DC
  Administered 2012-08-14 – 2012-08-15 (×2): 325 mg via ORAL
  Filled 2012-08-13 (×2): qty 1

## 2012-08-13 MED ORDER — 0.9 % SODIUM CHLORIDE (POUR BTL) OPTIME
TOPICAL | Status: DC | PRN
  Administered 2012-08-13: 1000 mL

## 2012-08-13 MED ORDER — SODIUM CHLORIDE 0.9 % IJ SOLN
OROMUCOSAL | Status: DC | PRN
  Administered 2012-08-13: 09:00:00 via TOPICAL

## 2012-08-13 MED ORDER — LACTATED RINGERS IV SOLN
INTRAVENOUS | Status: DC
  Administered 2012-08-14: 20 mL/h via INTRAVENOUS

## 2012-08-13 MED ORDER — SUCCINYLCHOLINE CHLORIDE 20 MG/ML IJ SOLN
INTRAMUSCULAR | Status: DC | PRN
  Administered 2012-08-13: 120 mg via INTRAVENOUS

## 2012-08-13 MED ORDER — MIDAZOLAM HCL 5 MG/ML IJ SOLN
INTRAMUSCULAR | Status: DC | PRN
  Administered 2012-08-13: 2 mg via INTRAVENOUS
  Administered 2012-08-13: 6 mg via INTRAVENOUS
  Administered 2012-08-13: 1 mg via INTRAVENOUS
  Administered 2012-08-13: 3 mg via INTRAVENOUS

## 2012-08-13 MED ORDER — PANTOPRAZOLE SODIUM 40 MG PO TBEC
40.0000 mg | DELAYED_RELEASE_TABLET | Freq: Every day | ORAL | Status: DC
  Administered 2012-08-15: 40 mg via ORAL
  Filled 2012-08-13: qty 1

## 2012-08-13 MED ORDER — MIDAZOLAM HCL 2 MG/2ML IJ SOLN
INTRAMUSCULAR | Status: AC
  Filled 2012-08-13: qty 2

## 2012-08-13 MED ORDER — ALBUMIN HUMAN 5 % IV SOLN: 250.0000 mL | INTRAVENOUS | Status: AC | PRN

## 2012-08-13 MED ORDER — METOPROLOL TARTRATE 12.5 MG HALF TABLET
12.5000 mg | ORAL_TABLET | Freq: Two times a day (BID) | ORAL | Status: DC
  Filled 2012-08-13 (×3): qty 1

## 2012-08-13 MED ORDER — ALBUMIN HUMAN 5 % IV SOLN
INTRAVENOUS | Status: DC | PRN
  Administered 2012-08-13: 12:00:00 via INTRAVENOUS

## 2012-08-13 MED ORDER — METOPROLOL TARTRATE 1 MG/ML IV SOLN: 2.5000 mg | INTRAVENOUS | Status: DC | PRN

## 2012-08-13 MED ORDER — BISACODYL 10 MG RE SUPP: 10.0000 mg | Freq: Every day | RECTAL | Status: DC

## 2012-08-13 MED ORDER — SODIUM CHLORIDE 0.45 % IV SOLN
INTRAVENOUS | Status: DC
  Administered 2012-08-13: 20 mL/h via INTRAVENOUS

## 2012-08-13 MED ORDER — ACETAMINOPHEN 160 MG/5ML PO SOLN
1000.0000 mg | Freq: Four times a day (QID) | ORAL | Status: DC
  Filled 2012-08-13: qty 40

## 2012-08-13 MED ORDER — DEXTROSE 5 % IV SOLN
1.5000 g | Freq: Two times a day (BID) | INTRAVENOUS | Status: AC
  Administered 2012-08-13 – 2012-08-15 (×4): 1.5 g via INTRAVENOUS
  Filled 2012-08-13 (×4): qty 1.5

## 2012-08-13 MED ORDER — ROCURONIUM BROMIDE 100 MG/10ML IV SOLN
INTRAVENOUS | Status: DC | PRN
  Administered 2012-08-13: 70 mg via INTRAVENOUS
  Administered 2012-08-13: 50 mg via INTRAVENOUS
  Administered 2012-08-13: 20 mg via INTRAVENOUS
  Administered 2012-08-13: 50 mg via INTRAVENOUS
  Administered 2012-08-13: 30 mg via INTRAVENOUS
  Administered 2012-08-13: 50 mg via INTRAVENOUS
  Administered 2012-08-13: 30 mg via INTRAVENOUS

## 2012-08-13 MED ORDER — MORPHINE SULFATE 2 MG/ML IJ SOLN
2.0000 mg | INTRAMUSCULAR | Status: DC | PRN
  Administered 2012-08-13 (×2): 2 mg via INTRAVENOUS
  Administered 2012-08-14: 4 mg via INTRAVENOUS
  Administered 2012-08-14: 2 mg via INTRAVENOUS
  Administered 2012-08-14 (×4): 4 mg via INTRAVENOUS
  Administered 2012-08-14: 2 mg via INTRAVENOUS
  Filled 2012-08-13: qty 1
  Filled 2012-08-13: qty 2
  Filled 2012-08-13 (×3): qty 1
  Filled 2012-08-13 (×2): qty 2
  Filled 2012-08-13: qty 1
  Filled 2012-08-13 (×2): qty 2

## 2012-08-13 MED ORDER — METOPROLOL TARTRATE 25 MG/10 ML ORAL SUSPENSION
12.5000 mg | Freq: Two times a day (BID) | ORAL | Status: DC
  Filled 2012-08-13 (×3): qty 5

## 2012-08-13 MED ORDER — DOCUSATE SODIUM 100 MG PO CAPS
200.0000 mg | ORAL_CAPSULE | Freq: Every day | ORAL | Status: DC
  Administered 2012-08-14 – 2012-08-15 (×2): 200 mg via ORAL
  Filled 2012-08-13 (×2): qty 2

## 2012-08-13 MED ORDER — PHENYLEPHRINE HCL 10 MG/ML IJ SOLN
0.0000 ug/min | INTRAVENOUS | Status: DC
  Filled 2012-08-13: qty 2

## 2012-08-13 MED ORDER — MIDAZOLAM HCL 2 MG/2ML IJ SOLN: 2.0000 mg | INTRAMUSCULAR | Status: DC | PRN

## 2012-08-13 MED ORDER — DEXMEDETOMIDINE HCL IN NACL 200 MCG/50ML IV SOLN: 0.1000 ug/kg/h | INTRAVENOUS | Status: DC

## 2012-08-13 MED ORDER — HEPARIN SODIUM (PORCINE) 1000 UNIT/ML IJ SOLN
INTRAMUSCULAR | Status: DC | PRN
  Administered 2012-08-13: 3000 [IU] via INTRAVENOUS
  Administered 2012-08-13: 2000 [IU] via INTRAVENOUS
  Administered 2012-08-13: 22000 [IU] via INTRAVENOUS

## 2012-08-13 MED ORDER — PROTAMINE SULFATE 10 MG/ML IV SOLN
INTRAVENOUS | Status: DC | PRN
  Administered 2012-08-13: 10 mg via INTRAVENOUS

## 2012-08-13 MED ORDER — MAGNESIUM SULFATE 40 MG/ML IJ SOLN
4.0000 g | Freq: Once | INTRAMUSCULAR | Status: AC
  Administered 2012-08-13: 4 g via INTRAVENOUS
  Filled 2012-08-13: qty 100

## 2012-08-13 MED ORDER — LACTATED RINGERS IV SOLN: 500.0000 mL | Freq: Once | INTRAVENOUS | Status: AC | PRN

## 2012-08-13 MED ORDER — OXYCODONE HCL 5 MG PO TABS
5.0000 mg | ORAL_TABLET | ORAL | Status: DC | PRN
  Administered 2012-08-13: 10 mg via ORAL
  Administered 2012-08-13: 5 mg via ORAL
  Filled 2012-08-13: qty 1
  Filled 2012-08-13: qty 2

## 2012-08-13 MED ORDER — ARTIFICIAL TEARS OP OINT
TOPICAL_OINTMENT | OPHTHALMIC | Status: DC | PRN
  Administered 2012-08-13: 1 via OPHTHALMIC

## 2012-08-13 MED ORDER — PROPOFOL 10 MG/ML IV BOLUS
INTRAVENOUS | Status: DC | PRN
  Administered 2012-08-13: 100 mg via INTRAVENOUS

## 2012-08-13 MED ORDER — FENTANYL CITRATE 0.05 MG/ML IJ SOLN
INTRAMUSCULAR | Status: DC | PRN
  Administered 2012-08-13 (×4): 250 ug via INTRAVENOUS
  Administered 2012-08-13: 100 ug via INTRAVENOUS
  Administered 2012-08-13 (×3): 250 ug via INTRAVENOUS

## 2012-08-13 MED ORDER — NITROGLYCERIN IN D5W 200-5 MCG/ML-% IV SOLN: 0.0000 ug/min | INTRAVENOUS | Status: DC

## 2012-08-13 MED ORDER — LACTATED RINGERS IV SOLN
INTRAVENOUS | Status: DC | PRN
  Administered 2012-08-13 (×5): via INTRAVENOUS

## 2012-08-13 MED ORDER — ACETAMINOPHEN 500 MG PO TABS
1000.0000 mg | ORAL_TABLET | Freq: Four times a day (QID) | ORAL | Status: DC
  Administered 2012-08-14 – 2012-08-15 (×5): 1000 mg via ORAL
  Filled 2012-08-13 (×8): qty 2

## 2012-08-13 MED ORDER — ACETAMINOPHEN 650 MG RE SUPP
650.0000 mg | Freq: Once | RECTAL | Status: AC
  Administered 2012-08-13: 650 mg via RECTAL

## 2012-08-13 MED ORDER — ATORVASTATIN CALCIUM 20 MG PO TABS
20.0000 mg | ORAL_TABLET | Freq: Every day | ORAL | Status: DC
  Administered 2012-08-14 – 2012-08-18 (×5): 20 mg via ORAL
  Filled 2012-08-13 (×5): qty 1

## 2012-08-13 MED ORDER — FENTANYL CITRATE 0.05 MG/ML IJ SOLN
INTRAMUSCULAR | Status: AC
  Filled 2012-08-13: qty 2

## 2012-08-13 MED ORDER — HEMOSTATIC AGENTS (NO CHARGE) OPTIME
TOPICAL | Status: DC | PRN
  Administered 2012-08-13: 1 via TOPICAL

## 2012-08-13 MED ORDER — POTASSIUM CHLORIDE 10 MEQ/50ML IV SOLN
10.0000 meq | INTRAVENOUS | Status: AC
Start: 2012-08-13 — End: 2012-08-13
  Administered 2012-08-13 (×3): 10 meq via INTRAVENOUS

## 2012-08-13 MED ORDER — SODIUM CHLORIDE 0.9 % IV SOLN: 250.0000 mL | INTRAVENOUS | Status: DC

## 2012-08-13 MED ORDER — ONDANSETRON HCL 4 MG/2ML IJ SOLN
4.0000 mg | Freq: Four times a day (QID) | INTRAMUSCULAR | Status: DC | PRN
  Administered 2012-08-14 (×2): 4 mg via INTRAVENOUS
  Filled 2012-08-13 (×2): qty 2

## 2012-08-13 MED ORDER — INSULIN REGULAR BOLUS VIA INFUSION
0.0000 [IU] | Freq: Three times a day (TID) | INTRAVENOUS | Status: DC
  Filled 2012-08-13: qty 10

## 2012-08-13 SURGICAL SUPPLY — 120 items
ADAPTER CARDIO PERF ANTE/RETRO (ADAPTER) ×5 IMPLANT
ADPR PRFSN 84XANTGRD RTRGD (ADAPTER) ×3
ATTRACTOMAT 16X20 MAGNETIC DRP (DRAPES) ×5 IMPLANT
BAG DECANTER FOR FLEXI CONT (MISCELLANEOUS) ×5 IMPLANT
BANDAGE ELASTIC 4 VELCRO ST LF (GAUZE/BANDAGES/DRESSINGS) ×5 IMPLANT
BANDAGE ELASTIC 6 VELCRO ST LF (GAUZE/BANDAGES/DRESSINGS) ×5 IMPLANT
BANDAGE GAUZE ELAST BULKY 4 IN (GAUZE/BANDAGES/DRESSINGS) ×5 IMPLANT
BASKET HEART  (ORDER IN 25'S) (MISCELLANEOUS) ×1
BASKET HEART (ORDER IN 25'S) (MISCELLANEOUS) ×1
BASKET HEART (ORDER IN 25S) (MISCELLANEOUS) ×3 IMPLANT
BLADE NDL 3 SS STRL (BLADE) IMPLANT
BLADE NEEDLE 3 SS STRL (BLADE) ×4 IMPLANT
BLADE NEEDLE 3MM SS STRL (BLADE) ×1
BLADE STERNUM SYSTEM 6 (BLADE) ×5 IMPLANT
BLADE SURG 12 STRL SS (BLADE) ×5 IMPLANT
BLADE SURG ROTATE 9660 (MISCELLANEOUS) ×2 IMPLANT
CANISTER SUCTION 2500CC (MISCELLANEOUS) ×5 IMPLANT
CANNULA AORTIC HI-FLOW 6.5M20F (CANNULA) ×5 IMPLANT
CANNULA ARTERIAL NVNT 3/8 20FR (MISCELLANEOUS) ×5 IMPLANT
CANNULA GUNDRY RCSP 15FR (MISCELLANEOUS) ×5 IMPLANT
CANNULA VENOUS LOW PROF 32X40 (CANNULA) ×5 IMPLANT
CANNULA VENOUS MAL SGL STG 40 (MISCELLANEOUS) IMPLANT
CANNULA VESSEL W/WING W/VALVE (CANNULA) ×6 IMPLANT
CANNULAE VENOUS MAL SGL STG 40 (MISCELLANEOUS)
CATH CPB KIT GERHARDT (MISCELLANEOUS) ×2 IMPLANT
CATH CPB KIT VANTRIGT (MISCELLANEOUS) ×3 IMPLANT
CATH ROBINSON RED A/P 18FR (CATHETERS) ×17 IMPLANT
CATH THORACIC 28FR (CATHETERS) IMPLANT
CATH THORACIC 28FR RT ANG (CATHETERS) IMPLANT
CATH THORACIC 36FR (CATHETERS) IMPLANT
CATH THORACIC 36FR RT ANG (CATHETERS) ×10 IMPLANT
CLIP TI WIDE RED SMALL 24 (CLIP) ×2 IMPLANT
CLOTH BEACON ORANGE TIMEOUT ST (SAFETY) ×5 IMPLANT
COVER MAYO STAND STRL (DRAPES) ×2 IMPLANT
COVER SURGICAL LIGHT HANDLE (MISCELLANEOUS) ×5 IMPLANT
CRADLE DONUT ADULT HEAD (MISCELLANEOUS) ×5 IMPLANT
DRAIN CHANNEL 32F RND 10.7 FF (WOUND CARE) ×5 IMPLANT
DRAPE CARDIOVASCULAR INCISE (DRAPES) ×5
DRAPE SLUSH/WARMER DISC (DRAPES) ×5 IMPLANT
DRAPE SRG 135X102X78XABS (DRAPES) ×3 IMPLANT
DRSG AQUACEL AG ADV 3.5X10 (GAUZE/BANDAGES/DRESSINGS) ×4 IMPLANT
DRSG COVADERM 4X14 (GAUZE/BANDAGES/DRESSINGS) ×5 IMPLANT
ELECT BLADE 4.0 EZ CLEAN MEGAD (MISCELLANEOUS) ×5
ELECT BLADE 6.5 EXT (BLADE) ×5 IMPLANT
ELECT CAUTERY BLADE 6.4 (BLADE) ×5 IMPLANT
ELECT REM PT RETURN 9FT ADLT (ELECTROSURGICAL) ×10
ELECTRODE BLDE 4.0 EZ CLN MEGD (MISCELLANEOUS) ×3 IMPLANT
ELECTRODE REM PT RTRN 9FT ADLT (ELECTROSURGICAL) ×6 IMPLANT
GLOVE BIO SURGEON STRL SZ 6.5 (GLOVE) ×3 IMPLANT
GLOVE BIO SURGEON STRL SZ7 (GLOVE) ×8 IMPLANT
GLOVE BIO SURGEON STRL SZ7.5 (GLOVE) ×14 IMPLANT
GLOVE BIO SURGEONS STRL SZ 6.5 (GLOVE) ×3
GLOVE BIOGEL PI IND STRL 6.5 (GLOVE) IMPLANT
GLOVE BIOGEL PI IND STRL 7.0 (GLOVE) IMPLANT
GLOVE BIOGEL PI INDICATOR 6.5 (GLOVE) ×8
GLOVE BIOGEL PI INDICATOR 7.0 (GLOVE) ×8
GOWN STRL NON-REIN LRG LVL3 (GOWN DISPOSABLE) ×24 IMPLANT
HEMOSTAT POWDER SURGIFOAM 1G (HEMOSTASIS) ×15 IMPLANT
HEMOSTAT SURGICEL 2X14 (HEMOSTASIS) ×7 IMPLANT
INSERT FOGARTY XLG (MISCELLANEOUS) IMPLANT
KIT BASIN OR (CUSTOM PROCEDURE TRAY) ×5 IMPLANT
KIT DRAINAGE VACCUM ASSIST (KITS) ×2 IMPLANT
KIT ROOM TURNOVER OR (KITS) ×5 IMPLANT
KIT SUCTION CATH 14FR (SUCTIONS) ×5 IMPLANT
KIT VASOVIEW W/TROCAR VH 2000 (KITS) ×5 IMPLANT
LEAD PACING MYOCARDI (MISCELLANEOUS) ×5 IMPLANT
MARKER GRAFT CORONARY BYPASS (MISCELLANEOUS) ×15 IMPLANT
NS IRRIG 1000ML POUR BTL (IV SOLUTION) ×27 IMPLANT
PACK OPEN HEART (CUSTOM PROCEDURE TRAY) ×5 IMPLANT
PAD ARMBOARD 7.5X6 YLW CONV (MISCELLANEOUS) ×10 IMPLANT
PAD ELECT DEFIB RADIOL ZOLL (MISCELLANEOUS) ×5 IMPLANT
PENCIL BUTTON HOLSTER BLD 10FT (ELECTRODE) ×5 IMPLANT
PUNCH AORTIC ROTATE 4.0MM (MISCELLANEOUS) IMPLANT
PUNCH AORTIC ROTATE 4.5MM 8IN (MISCELLANEOUS) ×2 IMPLANT
PUNCH AORTIC ROTATE 5MM 8IN (MISCELLANEOUS) IMPLANT
SET CARDIOPLEGIA MPS 5001102 (MISCELLANEOUS) ×2 IMPLANT
SPONGE GAUZE 4X4 12PLY (GAUZE/BANDAGES/DRESSINGS) ×10 IMPLANT
SPONGE LAP 18X18 X RAY DECT (DISPOSABLE) ×2 IMPLANT
SPONGE LAP 4X18 X RAY DECT (DISPOSABLE) ×2 IMPLANT
SUT BONE WAX W31G (SUTURE) ×5 IMPLANT
SUT MNCRL AB 4-0 PS2 18 (SUTURE) IMPLANT
SUT PROLENE 3 0 RB 1 (SUTURE) ×2 IMPLANT
SUT PROLENE 3 0 SH DA (SUTURE) IMPLANT
SUT PROLENE 3 0 SH1 36 (SUTURE) IMPLANT
SUT PROLENE 4 0 RB 1 (SUTURE) ×5
SUT PROLENE 4 0 SH DA (SUTURE) ×5 IMPLANT
SUT PROLENE 4-0 RB1 .5 CRCL 36 (SUTURE) ×3 IMPLANT
SUT PROLENE 5 0 C 1 36 (SUTURE) IMPLANT
SUT PROLENE 6 0 C 1 30 (SUTURE) ×6 IMPLANT
SUT PROLENE 6 0 CC (SUTURE) ×6 IMPLANT
SUT PROLENE 7 0 DA (SUTURE) IMPLANT
SUT PROLENE 7.0 RB 3 (SUTURE) ×21 IMPLANT
SUT PROLENE 8 0 BV175 6 (SUTURE) ×4 IMPLANT
SUT PROLENE BLUE 7 0 (SUTURE) ×7 IMPLANT
SUT SILK  1 MH (SUTURE)
SUT SILK 1 MH (SUTURE) IMPLANT
SUT SILK 2 0 SH CR/8 (SUTURE) ×2 IMPLANT
SUT SILK 3 0 SH CR/8 (SUTURE) IMPLANT
SUT STEEL 6MS V (SUTURE) ×10 IMPLANT
SUT STEEL SZ 6 DBL 3X14 BALL (SUTURE) ×5 IMPLANT
SUT VIC AB 1 CTX 36 (SUTURE) ×20
SUT VIC AB 1 CTX36XBRD ANBCTR (SUTURE) ×6 IMPLANT
SUT VIC AB 2-0 CT1 27 (SUTURE) ×5
SUT VIC AB 2-0 CT1 TAPERPNT 27 (SUTURE) IMPLANT
SUT VIC AB 2-0 CTX 27 (SUTURE) IMPLANT
SUT VIC AB 3-0 SH 27 (SUTURE)
SUT VIC AB 3-0 SH 27X BRD (SUTURE) IMPLANT
SUT VIC AB 3-0 X1 27 (SUTURE) ×2 IMPLANT
SUT VICRYL 4-0 PS2 18IN ABS (SUTURE) IMPLANT
SUTURE E-PAK OPEN HEART (SUTURE) ×5 IMPLANT
SYSTEM SAHARA CHEST DRAIN ATS (WOUND CARE) ×5 IMPLANT
TAPE CLOTH SURG 4X10 WHT LF (GAUZE/BANDAGES/DRESSINGS) ×2 IMPLANT
TAPE PAPER 2X10 WHT MICROPORE (GAUZE/BANDAGES/DRESSINGS) ×2 IMPLANT
TOWEL OR 17X24 6PK STRL BLUE (TOWEL DISPOSABLE) ×10 IMPLANT
TOWEL OR 17X26 10 PK STRL BLUE (TOWEL DISPOSABLE) ×10 IMPLANT
TRAY FOLEY IC TEMP SENS 14FR (CATHETERS) ×5 IMPLANT
TUBE SUCT INTRACARD DLP 20F (MISCELLANEOUS) ×5 IMPLANT
TUBING INSUFFLATION 10FT LAP (TUBING) ×5 IMPLANT
UNDERPAD 30X30 INCONTINENT (UNDERPADS AND DIAPERS) ×5 IMPLANT
WATER STERILE IRR 1000ML POUR (IV SOLUTION) ×10 IMPLANT

## 2012-08-13 NOTE — Anesthesia Postprocedure Evaluation (Signed)
  Anesthesia Post-op Note  Patient: George [name]  Procedure(s) Performed: Procedure(s): CORONARY ARTERY BYPASS GRAFTING (CABG) (N/A) INTRAOPERATIVE TRANSESOPHAGEAL ECHOCARDIOGRAM (N/A) ENDOVEIN HARVEST OF GREATER SAPHENOUS VEIN (Right)  Patient Location: PACU and SICU  Anesthesia Type:General  Level of Consciousness: sedated  Airway and Oxygen Therapy: Patient remains intubated per anesthesia plan  Post-op Pain: mild  Post-op Assessment: Post-op Vital signs reviewed  Post-op Vital Signs: Reviewed  Complications: No apparent anesthesia complications

## 2012-08-13 NOTE — Brief Op Note (Signed)
08/13/2012  11:19 AM  PATIENT:  George [name]  67 y.o. male  PRE-OPERATIVE DIAGNOSIS:  CAD  POST-OPERATIVE DIAGNOSIS:  CAD  PROCEDURE:  Procedure(s): CORONARY ARTERY BYPASS GRAFTING (CABG)X3 LIMA-LAD; SVG-OM2; SVG-RAMUS INTRAOPERATIVE TRANSESOPHAGEAL ECHOCARDIOGRAM [facility] RIGHT THIGH  SURGEON:  Surgeon(s): Kerin Perna, MD  PHYSICIAN ASSISTANT: WAYNE GOLD PA-C  ANESTHESIA:   general  PATIENT CONDITION:  ICU - intubated and hemodynamically stable.  PRE-OPERATIVE WEIGHT: 100kg  COMPLICATIONS: NO KNOWN

## 2012-08-13 NOTE — Anesthesia Procedure Notes (Signed)
Procedures RIJ CVP Dual Lumen: [id number]: The patient was identified and consent obtained.  TO was performed, and full barrier precautions were used.  The skin was anesthetized with lidocaine.  Once the vein was located with the 22 ga. needle using ultrasound guidance , the wire was inserted into the vein.  The wire location was confirmed with ultrasound.  The tissue was dilated and the catheter was carefully inserted, then sutured in place. A dressing was applied. The patient tolerated the procedure well.   CE 

## 2012-08-13 NOTE — Op Note (Signed)
NAMEMELINDA, [name] NO.:  [id number]  MEDICAL RECORD NO.:  [id number]  LOCATION:  2316                         FACILITY:  [facility]  PHYSICIAN:  Kerin Perna, M.D.  DATE OF BIRTH:  11-Sep-1945  DATE OF PROCEDURE:  08/13/2012 DATE OF DISCHARGE:                              OPERATIVE REPORT   OPERATION: 1. Coronary artery bypass grafting x3 (left internal mammary artery     LAD, saphenous vein graft to ramus intermediate, saphenous vein     graft to OM2). 2. Endoscopic harvest of right leg greater saphenous vein.  SURGEON:  Kerin Perna, M.D.  ASSISTANT:  Rowe Clack, P.A.-C.  PREOPERATIVE DIAGNOSIS:  Class 3 exertional angina with severe 3-vessel coronary artery disease.  POSTOPERATIVE DIAGNOSIS:  Class 3 exertional angina with severe 3-vessel coronary artery disease.  ANESTHESIA:  General by Dr. Judie Petit, M.D.  INDICATIONS:  The patient is a 67 year old obese diabetic reformed smoker, who presents with exertional angina and recent cardiac catheterization demonstrating high-grade stenosis of the LAD, ramus, and circumflex.  The right coronary had minimal disease.  EF was preserved. He was felt to be a candidate for surgical revascularization.  Prior to surgery, I saw the patient in the hospital following his cardiac catheterization and reviewed results of the cardiac catheterization with the patient and family.  I discussed the indications and expected benefits of coronary artery bypass grafting for treatment of his severe coronary artery disease.  I reviewed the alternatives to surgical therapy as well.  I discussed with him the details of surgery including the location of the surgical incisions, the use of general anesthesia and cardiopulmonary bypass, and the expected postoperative hospital recovery.  I reviewed with the patient the risks to him of coronary artery bypass surgery including risks of MI, CVA, bleeding, infection, and death.   He understood these issues and agreed to proceed with surgery under what I felt was an informed consent.  OPERATIVE FINDINGS: 1. Intraoperative TEE shows mild aortic insufficiency. 2. Small coronary vessel size compared to the size of the patient. 3. Adequate conduit. 4. No intraoperative transfusion required.  OPERATIVE PROCEDURE:  The patient was brought to the operating room and placed supine on the operating table where general anesthesia was induced.  The transesophageal echo probe was placed by the anesthesiologist.  This showed some evidence of mild AI from the non- coronary cusp.  It was a tricuspid valve leaflet.  The patient was prepped and draped as a sterile field.  A proper time-out was performed.  A sternal incision was made as the saphenous vein was harvested endoscopically from the right leg.  The left internal mammary artery was harvested as a pedicle graft from its origin at the subclavian vessels. It was a good vessel with excellent flow.  The sternal retractor was replaced.  The pericardium was opened and suspended.  Pursestrings were placed in the ascending aorta and right atrium and after the heparin has been administered and the ACT documented as being therapeutic.  The patient was cannulated and placed on cardiopulmonary bypass.  The coronaries were identified for grafting.  The mammary artery and vein grafts were prepared for  the distal coronary anastomoses.  The patient was cooled to 32 degrees and the cardioplegic cannulas were placed.  The crossclamp was placed.  A liter of cold blood cardioplegia was delivered in split doses between the antegrade aortic and retrograde coronary sinus catheters.  There was good cardioplegic arrest.  There was no difficulty delivering cardioplegia through the aortic root, which again suggestive that the clinical significance of the aortic insufficiency was minimal.  The distal coronary anastomoses were then  performed.  First, distal anastomosis was to the OM2 branch of the circumflex.  This was a small 1 mm vessel.  It had a proximal 80% stenosis.  A reverse saphenous vein was sewn end-to-side with running 7-0 Prolene with good flow through the graft.  The second distal anastomosis was the ramus intermediate branch of the left coronary.  This had a proximal 80%-90% stenosis.  A reverse saphenous vein was sewn end-to-side with running 7- 0 Prolene with excellent flow through graft.  Cardioplegia was redosed.  Third distal anastomosis was to the mid LAD.  Here was 1.4-mm vessel. The left IMA pedicle was brought through an opening in the left lateral pericardium, was brought down onto the LAD and sewn end-to-side with running 8-0 Prolene.  There was excellent flow through the anastomosis after briefly releasing the pedicle bulldog on the mammary artery.  The bulldog was reapplied and the pedicle was secured at the epicardium. Cardioplegia was redosed.  While the crossclamp was still in place, 2 proximal vein anastomoses were performed on the ascending aorta with a 4.5 mm punch.  The aortic wall was somewhat thin.  A 6-0 Prolene was used to construct both proximal anastomoses and air was vented from the coronaries with a dose of retrograde warm blood cardioplegia.  The final anastomosis was tied and the crossclamp was removed.  The heart was cardioverted back to a regular rhythm.  The graft vein grafts were de-aired and inspected.  The grafts had good flow and hemostasis was documented at the proximal and distal anastomoses.  The patient was rewarmed and reperfused.  Temporary pacer wires were applied.  The lungs were re-expanded and the ventilator was resumed.  The patient was then weaned off bypass without inotropes after being warmed to 37 degrees.  Cardiac output and blood pressure were normal.  The echo showed good LV function.  There was still mild AI.  Protamine was administered  without adverse reaction.  The cannulas were removed.  The mediastinum was irrigated.  The superior pericardial fat was closed over the aorta.  An anterior mediastinal and left pleural chest tube were placed and brought through separate incisions.  The sternum was closed with wire.  The pectoralis fascia was closed with running #1 Vicryl.  The subcutaneous and skin layers were closed in running Vicryl and sterile dressings were applied. Total cardiopulmonary bypass time was 110 minutes.     Kerin Perna, M.D.     PV/MEDQ  D:  08/13/2012  T:  08/13/2012  Job:  [id number]

## 2012-08-13 NOTE — Progress Notes (Signed)
UR Completed.  George [name] Jane [phone] 08/13/2012  

## 2012-08-13 NOTE — Progress Notes (Signed)
The patient was examined and preop studies reviewed. There has been no change from the prior exam and the patient is ready for surgery.   Plan CABG on R [place] today

## 2012-08-13 NOTE — Transfer of Care (Signed)
Immediate Anesthesia Transfer of Care Note  Patient: George [name]  Procedure(s) Performed: Procedure(s): CORONARY ARTERY BYPASS GRAFTING (CABG) (N/A) INTRAOPERATIVE TRANSESOPHAGEAL ECHOCARDIOGRAM (N/A) ENDOVEIN HARVEST OF GREATER SAPHENOUS VEIN (Right)  Patient Location: SICU  Anesthesia Type:General  Level of Consciousness: sedated and Patient remains intubated per anesthesia plan  Airway & Oxygen Therapy: Patient remains intubated per anesthesia plan and Patient placed on Ventilator (see vital sign flow sheet for setting)  Post-op Assessment: Post -op Vital signs reviewed and stable  Post vital signs: Reviewed and stable  Complications: No apparent anesthesia complications

## 2012-08-13 NOTE — Progress Notes (Signed)
  Echocardiogram Echocardiogram Transesophageal has been performed.  George [name] 08/13/2012, 11:05 AM

## 2012-08-13 NOTE — Procedures (Signed)
Extubation Procedure Note  Patient Details:   Name: George [name] DOB: Aug 16, 1945 MRN: [id number]   Airway Documentation:     Evaluation  O2 sats: stable throughout Complications: No apparent complications Patient did tolerate procedure well. Bilateral Breath Sounds: Rhonchi;Diminished   Yes, Pt extubated to a 4lpm [place], NIF -32, VC , Pt able to vocalize  Melanee Spry 08/13/2012, 4:54 PM

## 2012-08-13 NOTE — Preoperative (Signed)
Beta Blockers   Reason not to administer Beta Blockers:received toprol at 0430 today

## 2012-08-13 NOTE — Anesthesia Preprocedure Evaluation (Addendum)
Anesthesia Evaluation  Patient identified by MRN, date of birth, ID band Patient awake    Reviewed: Allergy & Precautions, H&P , NPO status , Patient's Chart, lab work & pertinent test results  Airway Mallampati: II      Dental   Pulmonary  breath sounds clear to auscultation        Cardiovascular hypertension, + CAD Rhythm:Regular Rate:Normal     Neuro/Psych    GI/Hepatic GERD-  ,  Endo/Other  diabetes  Renal/GU      Musculoskeletal   Abdominal   Peds  Hematology   Anesthesia Other Findings   Reproductive/Obstetrics                          Anesthesia Physical Anesthesia Plan  ASA: III  Anesthesia Plan: General   Post-op Pain Management:    Induction: Intravenous  Airway Management Planned: Oral ETT and Video Laryngoscope Planned  Additional Equipment:   Intra-op Plan:   Post-operative Plan: Post-operative intubation/ventilation  Informed Consent: I have reviewed the patients History and Physical, chart, labs and discussed the procedure including the risks, benefits and alternatives for the proposed anesthesia with the patient or authorized representative who has indicated his/her understanding and acceptance.   Dental advisory given  Plan Discussed with: CRNA, Anesthesiologist and Surgeon  Anesthesia Plan Comments:        Anesthesia Quick Evaluation

## 2012-08-14 ENCOUNTER — Encounter (HOSPITAL_COMMUNITY): Payer: Self-pay | Admitting: Cardiothoracic Surgery

## 2012-08-14 ENCOUNTER — Inpatient Hospital Stay (HOSPITAL_COMMUNITY): Payer: Medicare Other

## 2012-08-14 LAB — GLUCOSE, CAPILLARY
Glucose-Capillary: 106 mg/dL — ABNORMAL HIGH (ref 70–99)
Glucose-Capillary: 110 mg/dL — ABNORMAL HIGH (ref 70–99)
Glucose-Capillary: 114 mg/dL — ABNORMAL HIGH (ref 70–99)
Glucose-Capillary: 116 mg/dL — ABNORMAL HIGH (ref 70–99)
Glucose-Capillary: 117 mg/dL — ABNORMAL HIGH (ref 70–99)
Glucose-Capillary: 119 mg/dL — ABNORMAL HIGH (ref 70–99)
Glucose-Capillary: 120 mg/dL — ABNORMAL HIGH (ref 70–99)
Glucose-Capillary: 124 mg/dL — ABNORMAL HIGH (ref 70–99)
Glucose-Capillary: 125 mg/dL — ABNORMAL HIGH (ref 70–99)
Glucose-Capillary: 125 mg/dL — ABNORMAL HIGH (ref 70–99)
Glucose-Capillary: 126 mg/dL — ABNORMAL HIGH (ref 70–99)
Glucose-Capillary: 127 mg/dL — ABNORMAL HIGH (ref 70–99)
Glucose-Capillary: 128 mg/dL — ABNORMAL HIGH (ref 70–99)
Glucose-Capillary: 129 mg/dL — ABNORMAL HIGH (ref 70–99)
Glucose-Capillary: 133 mg/dL — ABNORMAL HIGH (ref 70–99)
Glucose-Capillary: 134 mg/dL — ABNORMAL HIGH (ref 70–99)
Glucose-Capillary: 157 mg/dL — ABNORMAL HIGH (ref 70–99)
Glucose-Capillary: 162 mg/dL — ABNORMAL HIGH (ref 70–99)
Glucose-Capillary: 168 mg/dL — ABNORMAL HIGH (ref 70–99)
Glucose-Capillary: 224 mg/dL — ABNORMAL HIGH (ref 70–99)

## 2012-08-14 LAB — PREPARE PLATELET PHERESIS: Unit division: 0

## 2012-08-14 LAB — BASIC METABOLIC PANEL
BUN: 12 mg/dL (ref 6–23)
CO2: 26 mEq/L (ref 19–32)
Chloride: 102 mEq/L (ref 96–112)
GFR calc Af Amer: >90 mL/min (ref >90)
Potassium: 4.7 mEq/L (ref 3.5–5.1)

## 2012-08-14 LAB — CBC
HCT: 32.5 % — ABNORMAL LOW (ref 39.0–52.0)
Hemoglobin: 11.7 g/dL — ABNORMAL LOW (ref 13.0–17.0)
MCHC: 34.4 g/dL (ref 30.0–36.0)
MCV: 80.8 fL (ref 78.0–100.0)
RBC: 4.02 MIL/uL — ABNORMAL LOW (ref 4.22–5.81)
RBC: 4.13 MIL/uL — ABNORMAL LOW (ref 4.22–5.81)
RDW: 14.1 % (ref 11.5–15.5)
WBC: 11.5 10*3/uL — ABNORMAL HIGH (ref 4.0–10.5)
WBC: 9.5 10*3/uL (ref 4.0–10.5)

## 2012-08-14 LAB — POCT I-STAT, CHEM 8
BUN: 16 mg/dL (ref 6–23)
Calcium, Ion: 1.15 mmol/L (ref 1.13–1.30)
Chloride: 99 mEq/L (ref 96–112)
Creatinine, Ser: 0.9 mg/dL (ref 0.50–1.35)
Glucose, Bld: 218 mg/dL — ABNORMAL HIGH (ref 70–99)
HCT: 35 % — ABNORMAL LOW (ref 39.0–52.0)
Hemoglobin: 11.9 g/dL — ABNORMAL LOW (ref 13.0–17.0)
Potassium: 4.5 mEq/L (ref 3.5–5.1)
Sodium: 136 mEq/L (ref 135–145)
TCO2: 24 mmol/L (ref 0–100)

## 2012-08-14 LAB — CREATININE, SERUM
Creatinine, Ser: 0.84 mg/dL (ref 0.50–1.35)
GFR calc Af Amer: >90 mL/min (ref >90)
GFR calc non Af Amer: 89 mL/min — ABNORMAL LOW (ref >90)

## 2012-08-14 LAB — MAGNESIUM
Magnesium: 2.1 mg/dL (ref 1.5–2.5)
Magnesium: 1.9 mg/dL (ref 1.5–2.5)

## 2012-08-14 LAB — TYPE AND SCREEN
ABO/RH(D): O POS
Antibody Screen: NEGATIVE

## 2012-08-14 MED ORDER — METOPROLOL TARTRATE 25 MG PO TABS
25.0000 mg | ORAL_TABLET | Freq: Two times a day (BID) | ORAL | Status: DC
  Administered 2012-08-14 – 2012-08-15 (×3): 25 mg via ORAL
  Filled 2012-08-14 (×4): qty 1

## 2012-08-14 MED ORDER — INSULIN DETEMIR 100 UNIT/ML ~~LOC~~ SOLN
25.0000 [IU] | Freq: Two times a day (BID) | SUBCUTANEOUS | Status: DC
  Administered 2012-08-14 – 2012-08-16 (×6): 25 [IU] via SUBCUTANEOUS
  Filled 2012-08-14 (×7): qty 0.25

## 2012-08-14 MED ORDER — FUROSEMIDE 10 MG/ML IJ SOLN
40.0000 mg | Freq: Every day | INTRAMUSCULAR | Status: DC
  Administered 2012-08-14 – 2012-08-15 (×2): 40 mg via INTRAVENOUS
  Filled 2012-08-14 (×2): qty 4

## 2012-08-14 MED ORDER — INSULIN DETEMIR 100 UNIT/ML ~~LOC~~ SOLN: 20.0000 [IU] | Freq: Two times a day (BID) | SUBCUTANEOUS | Status: DC

## 2012-08-14 MED ORDER — PROMETHAZINE HCL 25 MG/ML IJ SOLN: 12.5000 mg | Freq: Four times a day (QID) | INTRAMUSCULAR | Status: DC | PRN

## 2012-08-14 MED ORDER — TRAMADOL HCL 50 MG PO TABS
50.0000 mg | ORAL_TABLET | Freq: Four times a day (QID) | ORAL | Status: DC | PRN
  Administered 2012-08-14 – 2012-08-15 (×3): 50 mg via ORAL
  Filled 2012-08-14 (×3): qty 1

## 2012-08-14 MED ORDER — METOCLOPRAMIDE HCL 5 MG/ML IJ SOLN
10.0000 mg | Freq: Four times a day (QID) | INTRAMUSCULAR | Status: DC
  Administered 2012-08-14 – 2012-08-15 (×5): 10 mg via INTRAVENOUS
  Filled 2012-08-14 (×6): qty 2

## 2012-08-14 MED ORDER — INSULIN ASPART 100 UNIT/ML ~~LOC~~ SOLN
0.0000 [IU] | SUBCUTANEOUS | Status: DC
  Administered 2012-08-14: 2 [IU] via SUBCUTANEOUS
  Administered 2012-08-14: 4 [IU] via SUBCUTANEOUS
  Administered 2012-08-14: 8 [IU] via SUBCUTANEOUS
  Administered 2012-08-15 (×2): 4 [IU] via SUBCUTANEOUS
  Administered 2012-08-15: 8 [IU] via SUBCUTANEOUS
  Administered 2012-08-15: 4 [IU] via SUBCUTANEOUS

## 2012-08-14 MED ORDER — INSULIN ASPART 100 UNIT/ML ~~LOC~~ SOLN
3.0000 [IU] | Freq: Three times a day (TID) | SUBCUTANEOUS | Status: DC
  Administered 2012-08-14 – 2012-08-15 (×4): 3 [IU] via SUBCUTANEOUS

## 2012-08-14 MED ORDER — CLONIDINE HCL 0.1 MG PO TABS
0.1000 mg | ORAL_TABLET | Freq: Two times a day (BID) | ORAL | Status: DC
  Administered 2012-08-14 – 2012-08-18 (×9): 0.1 mg via ORAL
  Filled 2012-08-14 (×10): qty 1

## 2012-08-14 MED FILL — Electrolyte-R (PH 7.4) Solution: INTRAVENOUS | Qty: 4000 | Status: AC

## 2012-08-14 MED FILL — Sodium Bicarbonate IV Soln 8.4%: INTRAVENOUS | Qty: 50 | Status: AC

## 2012-08-14 MED FILL — Sodium Chloride Irrigation Soln 0.9%: Qty: 3000 | Status: AC

## 2012-08-14 MED FILL — Heparin Sodium (Porcine) Inj 1000 Unit/ML: INTRAMUSCULAR | Qty: 10 | Status: AC

## 2012-08-14 MED FILL — Heparin Sodium (Porcine) Inj 1000 Unit/ML: INTRAMUSCULAR | Qty: 30 | Status: AC

## 2012-08-14 MED FILL — Sodium Chloride IV Soln 0.9%: INTRAVENOUS | Qty: 1000 | Status: AC

## 2012-08-14 MED FILL — Potassium Chloride Inj 2 mEq/ML: INTRAVENOUS | Qty: 40 | Status: AC

## 2012-08-14 MED FILL — Mannitol IV Soln 20%: INTRAVENOUS | Qty: 500 | Status: AC

## 2012-08-14 MED FILL — Magnesium Sulfate Inj 50%: INTRAMUSCULAR | Qty: 10 | Status: AC

## 2012-08-14 MED FILL — Lidocaine HCl IV Inj 20 MG/ML: INTRAVENOUS | Qty: 10 | Status: AC

## 2012-08-14 NOTE — Progress Notes (Signed)
1 Day Post-Op Procedure(s) (LRB): CORONARY ARTERY BYPASS GRAFTING (CABG) (N/A) INTRAOPERATIVE TRANSESOPHAGEAL ECHOCARDIOGRAM (N/A) ENDOVEIN HARVEST OF GREATER SAPHENOUS VEIN (Right) Subjective: Doing well POD1 Some nausea  Objective: Vital signs in last 24 hours: Temp:  [96.1 F (35.6 C)-100 F (37.8 C)] 98.2 F (36.8 C) (08/01 0800) Pulse Rate:  [72-90] 77 (08/01 0800) Cardiac Rhythm:  [-] Normal sinus rhythm (08/01 0745) Resp:  [12-73] 12 (08/01 0800) BP: (82-147)/(52-87) 120/61 mmHg (08/01 0800) SpO2:  [96 %-100 %] 97 % (08/01 0800) Arterial Line BP: (90-182)/(43-82) 129/59 mmHg (08/01 0800) FiO2 (%):  [40 %-50 %] 40 % (07/31 1621) Weight:  [220 lb (99.791 kg)-225 lb 5 oz (102.2 kg)] 225 lb 5 oz (102.2 kg) (08/01 0500)  Hemodynamic parameters for last 24 hours: PAP: (18-48)/(8-28) 30/18 mmHg CO:  [4.7 L/min-7.6 L/min] 7.2 L/min CI:  [2.1 L/min/m2-3.3 L/min/m2] 3.2 L/min/m2  Intake/Output from previous day: 07/31 0701 - 08/01 0700 In: 5677.6 [I.V.:3978.6; Blood:819; NG/GT:30; IV Piggyback:850] Out: 6315 [Urine:4015; Emesis/NG output:100; Blood:1650; Chest Tube:550] Intake/Output this shift: Total I/O In: 50 [I.V.:50] Out: 30 [Urine:30]  Lungs clear abd nontender  Lab Results:  Recent Labs  08/13/12 1900 08/14/12 0434  WBC 14.5* 11.5*  HGB 11.8* 11.4*  HCT 33.2* 32.5*  PLT 185 155   BMET:  Recent Labs  08/11/12 1104  08/13/12 1858 08/13/12 1900 08/14/12 0434  NA 136  < > 141  --  135  K 4.0  < > 4.7  --  4.7  CL 102  --  104  --  102  CO2 21  --   --   --  26  GLUCOSE 139*  < > 148*  --  149*  BUN 12  --  10  --  12  CREATININE 0.63  --  0.90 0.71 0.67  CALCIUM 9.7  --   --   --  8.1*  < > = values in this interval not displayed.  PT/INR:  Recent Labs  08/13/12 1300  LABPROT 16.1*  INR 1.32   ABG    Component Value Date/Time   PHART 7.346* 08/13/2012 1757   HCO3 25.2* 08/13/2012 1757   TCO2 24 08/13/2012 1858   ACIDBASEDEF 1.0 08/13/2012  1757   O2SAT 96.0 08/13/2012 1757   CBG (last 3)   Recent Labs  08/14/12 0435 08/14/12 0538 08/14/12 0640  GLUCAP 129* 133* 127*    Assessment/Plan: S/P Procedure(s) (LRB): CORONARY ARTERY BYPASS GRAFTING (CABG) (N/A) INTRAOPERATIVE TRANSESOPHAGEAL ECHOCARDIOGRAM (N/A) ENDOVEIN HARVEST OF GREATER SAPHENOUS VEIN (Right) See progression orders   LOS: 1 day    VAN TRIGT III,PETER 08/14/2012

## 2012-08-14 NOTE — Progress Notes (Signed)
Patient ID: George [name], male   DOB: February 09, 1945, 67 y.o.   MRN: [id number]  SICU Evening Rounds:  Hemodynamically stable  Urine output good BMET    Component Value Date/Time   NA 136 08/14/2012 1726   K 4.5 08/14/2012 1726   CL 99 08/14/2012 1726   CO2 26 08/14/2012 0434   GLUCOSE 218* 08/14/2012 1726   BUN 16 08/14/2012 1726   CREATININE 0.90 08/14/2012 1726   CALCIUM 8.1* 08/14/2012 0434   GFRNONAA 89* 08/14/2012 1700   GFRAA >90 08/14/2012 1700    CBC    Component Value Date/Time   WBC 9.5 08/14/2012 1700   RBC 4.13* 08/14/2012 1700   HGB 11.9* 08/14/2012 1726   HCT 35.0* 08/14/2012 1726   PLT 167 08/14/2012 1700   MCV 82.3 08/14/2012 1700   MCH 28.3 08/14/2012 1700   MCHC 34.4 08/14/2012 1700   RDW 14.4 08/14/2012 1700      No problems today.

## 2012-08-15 ENCOUNTER — Inpatient Hospital Stay (HOSPITAL_COMMUNITY): Payer: Medicare Other

## 2012-08-15 LAB — CBC
HCT: 33.3 % — ABNORMAL LOW (ref 39.0–52.0)
Hemoglobin: 11.5 g/dL — ABNORMAL LOW (ref 13.0–17.0)
MCH: 28.6 pg (ref 26.0–34.0)
MCHC: 34.5 g/dL (ref 30.0–36.0)
MCV: 82.8 fL (ref 78.0–100.0)
Platelets: 162 10*3/uL (ref 150–400)
RBC: 4.02 MIL/uL — ABNORMAL LOW (ref 4.22–5.81)
RDW: 14.2 % (ref 11.5–15.5)
WBC: 10.8 10*3/uL — ABNORMAL HIGH (ref 4.0–10.5)

## 2012-08-15 LAB — GLUCOSE, CAPILLARY
Glucose-Capillary: 131 mg/dL — ABNORMAL HIGH (ref 70–99)
Glucose-Capillary: 157 mg/dL — ABNORMAL HIGH (ref 70–99)
Glucose-Capillary: 164 mg/dL — ABNORMAL HIGH (ref 70–99)
Glucose-Capillary: 187 mg/dL — ABNORMAL HIGH (ref 70–99)
Glucose-Capillary: 200 mg/dL — ABNORMAL HIGH (ref 70–99)
Glucose-Capillary: 204 mg/dL — ABNORMAL HIGH (ref 70–99)

## 2012-08-15 LAB — BASIC METABOLIC PANEL
BUN: 22 mg/dL (ref 6–23)
CO2: 27 mEq/L (ref 19–32)
Calcium: 8.6 mg/dL (ref 8.4–10.5)
Chloride: 98 mEq/L (ref 96–112)
Creatinine, Ser: 1.05 mg/dL (ref 0.50–1.35)
GFR calc Af Amer: 83 mL/min — ABNORMAL LOW (ref >90)
GFR calc non Af Amer: 71 mL/min — ABNORMAL LOW (ref >90)
Glucose, Bld: 181 mg/dL — ABNORMAL HIGH (ref 70–99)
Potassium: 4 mEq/L (ref 3.5–5.1)
Sodium: 133 mEq/L — ABNORMAL LOW (ref 135–145)

## 2012-08-15 MED ORDER — OXYCODONE HCL 5 MG PO TABS
5.0000 mg | ORAL_TABLET | ORAL | Status: DC | PRN
  Administered 2012-08-16 – 2012-08-18 (×7): 10 mg via ORAL
  Filled 2012-08-15 (×7): qty 2

## 2012-08-15 MED ORDER — POTASSIUM CHLORIDE CRYS ER 20 MEQ PO TBCR
40.0000 meq | EXTENDED_RELEASE_TABLET | Freq: Every day | ORAL | Status: AC
  Administered 2012-08-16 – 2012-08-17 (×2): 40 meq via ORAL
  Filled 2012-08-15 (×3): qty 2

## 2012-08-15 MED ORDER — BISACODYL 10 MG RE SUPP: 10.0000 mg | Freq: Every day | RECTAL | Status: DC | PRN

## 2012-08-15 MED ORDER — INSULIN ASPART 100 UNIT/ML ~~LOC~~ SOLN
0.0000 [IU] | SUBCUTANEOUS | Status: DC
  Administered 2012-08-15: 2 [IU] via SUBCUTANEOUS
  Administered 2012-08-15: 8 [IU] via SUBCUTANEOUS
  Administered 2012-08-16 (×4): 2 [IU] via SUBCUTANEOUS
  Administered 2012-08-16: 4 [IU] via SUBCUTANEOUS
  Administered 2012-08-17: 2 [IU] via SUBCUTANEOUS

## 2012-08-15 MED ORDER — ONDANSETRON HCL 4 MG PO TABS: 4.0000 mg | ORAL_TABLET | Freq: Four times a day (QID) | ORAL | Status: DC | PRN

## 2012-08-15 MED ORDER — OXYCODONE HCL 5 MG PO TABS
5.0000 mg | ORAL_TABLET | ORAL | Status: DC | PRN
  Administered 2012-08-15: 5 mg via ORAL
  Filled 2012-08-15: qty 1

## 2012-08-15 MED ORDER — OXYCODONE HCL 5 MG PO TABS: 10.0000 mg | ORAL_TABLET | ORAL | Status: DC | PRN

## 2012-08-15 MED ORDER — METFORMIN HCL 500 MG PO TABS
1000.0000 mg | ORAL_TABLET | Freq: Two times a day (BID) | ORAL | Status: DC
  Administered 2012-08-16 – 2012-08-18 (×5): 1000 mg via ORAL
  Filled 2012-08-15 (×8): qty 2

## 2012-08-15 MED ORDER — SODIUM CHLORIDE 0.9 % IJ SOLN: 3.0000 mL | INTRAMUSCULAR | Status: DC | PRN

## 2012-08-15 MED ORDER — ASPIRIN EC 325 MG PO TBEC
325.0000 mg | DELAYED_RELEASE_TABLET | Freq: Every day | ORAL | Status: DC
  Administered 2012-08-16 – 2012-08-18 (×3): 325 mg via ORAL
  Filled 2012-08-15 (×3): qty 1

## 2012-08-15 MED ORDER — FAMOTIDINE 20 MG PO TABS
20.0000 mg | ORAL_TABLET | Freq: Two times a day (BID) | ORAL | Status: DC
  Administered 2012-08-15 – 2012-08-18 (×6): 20 mg via ORAL
  Filled 2012-08-15 (×7): qty 1

## 2012-08-15 MED ORDER — GLIMEPIRIDE 4 MG PO TABS
4.0000 mg | ORAL_TABLET | Freq: Two times a day (BID) | ORAL | Status: DC
  Administered 2012-08-15 – 2012-08-18 (×6): 4 mg via ORAL
  Filled 2012-08-15 (×7): qty 1

## 2012-08-15 MED ORDER — SODIUM CHLORIDE 0.9 % IJ SOLN
3.0000 mL | Freq: Two times a day (BID) | INTRAMUSCULAR | Status: DC
  Administered 2012-08-15 – 2012-08-17 (×4): 3 mL via INTRAVENOUS

## 2012-08-15 MED ORDER — ACETAMINOPHEN 325 MG PO TABS: 650.0000 mg | ORAL_TABLET | Freq: Four times a day (QID) | ORAL | Status: DC | PRN

## 2012-08-15 MED ORDER — BISACODYL 5 MG PO TBEC
10.0000 mg | DELAYED_RELEASE_TABLET | Freq: Every day | ORAL | Status: DC | PRN
  Administered 2012-08-17: 10 mg via ORAL
  Filled 2012-08-15: qty 2

## 2012-08-15 MED ORDER — TRAMADOL HCL 50 MG PO TABS: 50.0000 mg | ORAL_TABLET | ORAL | Status: DC | PRN

## 2012-08-15 MED ORDER — FUROSEMIDE 40 MG PO TABS
40.0000 mg | ORAL_TABLET | Freq: Every day | ORAL | Status: AC
  Administered 2012-08-16 – 2012-08-17 (×2): 40 mg via ORAL
  Filled 2012-08-15 (×2): qty 1

## 2012-08-15 MED ORDER — MOVING RIGHT ALONG BOOK
Freq: Once | Status: AC
  Administered 2012-08-15: 18:00:00
  Filled 2012-08-15: qty 1

## 2012-08-15 MED ORDER — METOPROLOL TARTRATE 12.5 MG HALF TABLET
12.5000 mg | ORAL_TABLET | Freq: Two times a day (BID) | ORAL | Status: DC
  Administered 2012-08-15: 12.5 mg via ORAL
  Filled 2012-08-15 (×3): qty 1

## 2012-08-15 MED ORDER — SODIUM CHLORIDE 0.9 % IV SOLN: 250.0000 mL | INTRAVENOUS | Status: DC | PRN

## 2012-08-15 MED ORDER — DOCUSATE SODIUM 100 MG PO CAPS
200.0000 mg | ORAL_CAPSULE | Freq: Every day | ORAL | Status: DC
  Administered 2012-08-17: 200 mg via ORAL
  Filled 2012-08-15 (×3): qty 2

## 2012-08-15 MED ORDER — ONDANSETRON HCL 4 MG/2ML IJ SOLN: 4.0000 mg | Freq: Four times a day (QID) | INTRAMUSCULAR | Status: DC | PRN

## 2012-08-15 NOTE — Progress Notes (Signed)
1258- report called to Brit,RN on 2000  1327- ambulated with pt to new room 2022, pt tolerated, placed pt on 2000's telemetry, receiving RN notified of pt's arrival, pt stable, NAD, denies pain, safety maintained

## 2012-08-15 NOTE — Progress Notes (Signed)
Pt in recliner upon arrival.  Pt stated he walked from SICU within the last 15 mins.  Oriented pt on CRP phase I and II.  Encouraged walking this afternoon.  Alexia Freestone, MS, ACSM RCEP 1:41 PM

## 2012-08-15 NOTE — Progress Notes (Signed)
2 Days Post-Op Procedure(s) (LRB): CORONARY ARTERY BYPASS GRAFTING (CABG) (N/A) INTRAOPERATIVE TRANSESOPHAGEAL ECHOCARDIOGRAM (N/A) ENDOVEIN HARVEST OF GREATER SAPHENOUS VEIN (Right) Subjective:  No complaints  Objective: Vital signs in last 24 hours: Temp:  [98.1 F (36.7 C)-99.1 F (37.3 C)] 98.1 F (36.7 C) (08/02 0802) Pulse Rate:  [82-112] 99 (08/02 1100) Cardiac Rhythm:  [-] Normal sinus rhythm (08/02 0800) Resp:  [14-26] 18 (08/02 1100) BP: (92-141)/(52-79) 129/67 mmHg (08/02 1100) SpO2:  [83 %-99 %] 97 % (08/02 1100) Weight:  [102 kg (224 lb 13.9 oz)] 102 kg (224 lb 13.9 oz) (08/02 0400)  Hemodynamic parameters for last 24 hours:    Intake/Output from previous day: 08/01 0701 - 08/02 0700 In: 1525 [P.O.:960; I.V.:515; IV Piggyback:50] Out: 1055 [Urine:1025; Chest Tube:30] Intake/Output this shift: Total I/O In: 320 [P.O.:240; I.V.:80] Out: 700 [Urine:700]  General appearance: alert and cooperative Neurologic: intact Heart: regular rate and rhythm, S1, S2 normal, no murmur, click, rub or gallop Lungs: clear to auscultation bilaterally Extremities: edema mild Wound: incision ok  Lab Results:  Recent Labs  08/14/12 1700 08/14/12 1726 08/15/12 0300  WBC 9.5  --  10.8*  HGB 11.7* 11.9* 11.5*  HCT 34.0* 35.0* 33.3*  PLT 167  --  162   BMET:  Recent Labs  08/14/12 0434  08/14/12 1726 08/15/12 0300  NA 135  --  136 133*  K 4.7  --  4.5 4.0  CL 102  --  99 98  CO2 26  --   --  27  GLUCOSE 149*  --  218* 181*  BUN 12  --  16 22  CREATININE 0.67  < > 0.90 1.05  CALCIUM 8.1*  --   --  8.6  < > = values in this interval not displayed.  PT/INR:  Recent Labs  08/13/12 1300  LABPROT 16.1*  INR 1.32   ABG    Component Value Date/Time   PHART 7.346* 08/13/2012 1757   HCO3 25.2* 08/13/2012 1757   TCO2 24 08/14/2012 1726   ACIDBASEDEF 1.0 08/13/2012 1757   O2SAT 96.0 08/13/2012 1757   CBG (last 3)   Recent Labs  08/14/12 1907 08/15/12 0011  08/15/12 0727  GLUCAP 224* 164* 187*    Assessment/Plan: S/P Procedure(s) (LRB): CORONARY ARTERY BYPASS GRAFTING (CABG) (N/A) INTRAOPERATIVE TRANSESOPHAGEAL ECHOCARDIOGRAM (N/A) ENDOVEIN HARVEST OF GREATER SAPHENOUS VEIN (Right) Mobilize Diuresis Diabetes control: He was on lantus bid and amaryl and glucophage preop. Will resume oral agents and levemir. d/c tubes/lines Plan for transfer to step-down: see transfer orders   LOS: 2 days    Jolynn Bajorek K 08/15/2012

## 2012-08-16 LAB — GLUCOSE, CAPILLARY
Glucose-Capillary: 117 mg/dL — ABNORMAL HIGH (ref 70–99)
Glucose-Capillary: 127 mg/dL — ABNORMAL HIGH (ref 70–99)
Glucose-Capillary: 151 mg/dL — ABNORMAL HIGH (ref 70–99)
Glucose-Capillary: 156 mg/dL — ABNORMAL HIGH (ref 70–99)
Glucose-Capillary: 157 mg/dL — ABNORMAL HIGH (ref 70–99)
Glucose-Capillary: 170 mg/dL — ABNORMAL HIGH (ref 70–99)

## 2012-08-16 MED ORDER — METOPROLOL TARTRATE 25 MG PO TABS
25.0000 mg | ORAL_TABLET | Freq: Two times a day (BID) | ORAL | Status: DC
  Administered 2012-08-16 (×2): 25 mg via ORAL
  Filled 2012-08-16 (×4): qty 1

## 2012-08-16 NOTE — Progress Notes (Signed)
Pt amb 450 ft pushing W/C. Pt did not have any complaints and stated "my legs even feel better." Pt assisted to chair with call bell in reach. Will continue to monitor pt closely.

## 2012-08-16 NOTE — Progress Notes (Signed)
Pt educated on IS use. Informed pt of need to use 10x every hour while awake.  Pt verbalized understanding and was able to demonstrate use of IS pulling 1750. Will continue to monitor pt closely.

## 2012-08-16 NOTE — Progress Notes (Signed)
Pt amb 350 ft pushing W/C. Pt did not have any complaints of SOB but did have complaints of cramping in his legs.  Pt assisted back to chair with call bell in reach. Will continue to monitor pt closely.

## 2012-08-16 NOTE — Progress Notes (Addendum)
[name]       [place] [address]             [phone]      3 Days Post-Op  Procedure(s) (LRB): CORONARY ARTERY BYPASS GRAFTING (CABG) (N/A) INTRAOPERATIVE TRANSESOPHAGEAL ECHOCARDIOGRAM (N/A) ENDOVEIN HARVEST OF GREATER SAPHENOUS VEIN (Right) Subjective: Feels very sore but otherwise ok  Objective  Telemetry sinus tachy with pac's  Temp:  [97.9 F (36.6 C)-99.6 F (37.6 C)] 99.6 F (37.6 C) (08/03 0402) Pulse Rate:  [83-102] 102 (08/03 0402) Resp:  [14-20] 18 (08/03 0402) BP: (88-129)/(52-75) 116/75 mmHg (08/03 0402) SpO2:  [91 %-98 %] 95 % (08/03 0402) Weight:  [223 lb 4.8 oz (101.288 kg)] 223 lb 4.8 oz (101.288 kg) (08/03 0500)   Intake/Output Summary (Last 24 hours) at 08/16/12 0757 Last data filed at 08/16/12 0750  Gross per 24 hour  Intake    580 ml  Output   1125 ml  Net   -545 ml       General appearance: alert, cooperative and no distress Heart: regular rate and rhythm and tachy Lungs: mildly dim in bases Abdomen: benign Extremities: no sig edema Wound: sternal dressing CDI, evh site healing well  Lab Results:  Recent Labs  08/14/12 0434 08/14/12 1700 08/14/12 1726 08/15/12 0300  NA 135  --  136 133*  K 4.7  --  4.5 4.0  CL 102  --  99 98  CO2 26  --   --  27  GLUCOSE 149*  --  218* 181*  BUN 12  --  16 22  CREATININE 0.67 0.84 0.90 1.05  CALCIUM 8.1*  --   --  8.6  MG 2.1 1.9  --   --    No results found for this basename: AST, ALT, ALKPHOS, BILITOT, PROT, ALBUMIN,  in the last 72 hours No results found for this basename: LIPASE, AMYLASE,  in the last 72 hours  Recent Labs  08/14/12 1700 08/14/12 1726 08/15/12 0300  WBC 9.5  --  10.8*  HGB 11.7* 11.9* 11.5*  HCT 34.0* 35.0* 33.3*  MCV 82.3  --  82.8  PLT 167  --  162   No results found for this basename: CKTOTAL, CKMB, TROPONINI,  in the last 72 hours No components found with this basename: POCBNP,  No results found for this basename: DDIMER,  in  the last 72 hours No results found for this basename: HGBA1C,  in the last 72 hours No results found for this basename: CHOL, HDL, LDLCALC, TRIG, CHOLHDL,  in the last 72 hours No results found for this basename: TSH, T4TOTAL, FREET3, T3FREE, THYROIDAB,  in the last 72 hours No results found for this basename: VITAMINB12, FOLATE, FERRITIN, TIBC, IRON, RETICCTPCT,  in the last 72 hours  Medications: Scheduled . aspirin EC  325 mg Oral Daily  . atorvastatin  20 mg Oral Daily  . cloNIDine  0.1 mg Oral BID  . docusate sodium  200 mg Oral Daily  . famotidine  20 mg Oral BID  . furosemide  40 mg Oral Daily  . glimepiride  4 mg Oral BID  . insulin aspart  0-24 Units Subcutaneous Q4H  . insulin detemir  25 Units Subcutaneous BID  . metFORMIN  1,000 mg Oral BID WC  . metoprolol tartrate  12.5 mg Oral BID  . potassium chloride  40 mEq Oral Daily  . sodium chloride  3 mL Intravenous Q12H  Radiology/Studies:  Dg Chest Port 1 View  08/15/2012   *RADIOLOGY REPORT*  Clinical Data: Postop cardiac surgery.  PORTABLE CHEST - 1 VIEW  Comparison: 08/14/2012.  Findings: Interval removal of Swan-Ganz catheter and left chest tube.  Mild left-sided atelectasis and pleural effusion.  Trace left apical pneumothorax.  Stable heart size and mediastinal contours status post CABG.  These results were called by telephone on 08/15/2012 at 0550 hours to RN Freida Busman, who verbally acknowledged these results.  IMPRESSION:  1.  Trace left apical pneumothorax status post chest tube removal. 2.  Left-sided atelectasis and pleural fluid appears stable from yesterday.   Original Report Authenticated By: Tiburcio Pea    INR: Will add last result for INR, ABG once components are confirmed Will add last 4 CBG results once components are confirmed  Assessment/Plan: S/P Procedure(s) (LRB): CORONARY ARTERY BYPASS GRAFTING (CABG) (N/A) INTRAOPERATIVE TRANSESOPHAGEAL ECHOCARDIOGRAM (N/A) ENDOVEIN HARVEST OF GREATER SAPHENOUS  VEIN (Right)  1 doing well '2 pain meds as needed 3 push rehab/pulm toilet 4 Results for George [name], George [name] (MRN [id number]) as of 08/16/2012 08:10  Ref. Range 08/15/2012 19:53 08/15/2012 23:45 08/16/2012 00:29 08/16/2012 03:57 08/16/2012 07:48  Glucose-Capillary Latest Range: 70-99 mg/dL 409 (H) 811 (H) 914 (H) 127 (H) 157 (H)  Sugars well controlled on current rx 5 gentle diuresis 6 will increase beta blocker 7 recheck labs in am      LOS: 3 days    George [name],George [name] 8/3/20147:57 AM   Chart reviewed, patient examined, agree with above.

## 2012-08-17 ENCOUNTER — Inpatient Hospital Stay (HOSPITAL_COMMUNITY): Payer: Medicare Other

## 2012-08-17 LAB — CBC
HCT: 29.1 % — ABNORMAL LOW (ref 39.0–52.0)
Hemoglobin: 9.9 g/dL — ABNORMAL LOW (ref 13.0–17.0)
MCH: 28.2 pg (ref 26.0–34.0)
MCHC: 34 g/dL (ref 30.0–36.0)
MCV: 82.9 fL (ref 78.0–100.0)
RDW: 13.8 % (ref 11.5–15.5)

## 2012-08-17 LAB — BASIC METABOLIC PANEL
BUN: 23 mg/dL (ref 6–23)
CO2: 26 mEq/L (ref 19–32)
Chloride: 100 mEq/L (ref 96–112)
Creatinine, Ser: 0.78 mg/dL (ref 0.50–1.35)
GFR calc Af Amer: >90 mL/min (ref >90)
Glucose, Bld: 107 mg/dL — ABNORMAL HIGH (ref 70–99)
Potassium: 3.7 mEq/L (ref 3.5–5.1)

## 2012-08-17 LAB — GLUCOSE, CAPILLARY
Glucose-Capillary: 117 mg/dL — ABNORMAL HIGH (ref 70–99)
Glucose-Capillary: 156 mg/dL — ABNORMAL HIGH (ref 70–99)
Glucose-Capillary: 161 mg/dL — ABNORMAL HIGH (ref 70–99)
Glucose-Capillary: 166 mg/dL — ABNORMAL HIGH (ref 70–99)
Glucose-Capillary: 192 mg/dL — ABNORMAL HIGH (ref 70–99)

## 2012-08-17 MED ORDER — INSULIN ASPART 100 UNIT/ML ~~LOC~~ SOLN: 0.0000 [IU] | SUBCUTANEOUS | Status: DC

## 2012-08-17 MED ORDER — FERROUS SULFATE 325 (65 FE) MG PO TABS
325.0000 mg | ORAL_TABLET | Freq: Every day | ORAL | Status: DC
  Administered 2012-08-18: 325 mg via ORAL
  Filled 2012-08-17 (×2): qty 1

## 2012-08-17 MED ORDER — POTASSIUM CHLORIDE CRYS ER 10 MEQ PO TBCR
30.0000 meq | EXTENDED_RELEASE_TABLET | Freq: Once | ORAL | Status: AC
  Administered 2012-08-17: 30 meq via ORAL
  Filled 2012-08-17: qty 1

## 2012-08-17 MED ORDER — METOPROLOL SUCCINATE ER 50 MG PO TB24
50.0000 mg | ORAL_TABLET | Freq: Every day | ORAL | Status: DC
  Administered 2012-08-17 – 2012-08-18 (×2): 50 mg via ORAL
  Filled 2012-08-17 (×2): qty 1

## 2012-08-17 MED ORDER — INSULIN ASPART 100 UNIT/ML ~~LOC~~ SOLN
0.0000 [IU] | Freq: Three times a day (TID) | SUBCUTANEOUS | Status: DC
  Administered 2012-08-17: 2 [IU] via SUBCUTANEOUS
  Administered 2012-08-17 (×2): 4 [IU] via SUBCUTANEOUS
  Administered 2012-08-18: 2 [IU] via SUBCUTANEOUS

## 2012-08-17 MED ORDER — LISINOPRIL 2.5 MG PO TABS
2.5000 mg | ORAL_TABLET | Freq: Every day | ORAL | Status: DC
  Administered 2012-08-17 – 2012-08-18 (×2): 2.5 mg via ORAL
  Filled 2012-08-17 (×2): qty 1

## 2012-08-17 MED ORDER — INSULIN DETEMIR 100 UNIT/ML ~~LOC~~ SOLN
27.0000 [IU] | Freq: Two times a day (BID) | SUBCUTANEOUS | Status: DC
  Administered 2012-08-17 (×2): 27 [IU] via SUBCUTANEOUS
  Filled 2012-08-17 (×3): qty 0.27

## 2012-08-17 NOTE — Progress Notes (Signed)
CARDIAC REHAB PHASE I   PRE:  Rate/Rhythm: 96 SR    BP: sitting 138/82    SaO2: 93 RA  MODE:  Ambulation: 890 ft   POST:  Rate/Rhythm: 104 ST    BP: sitting 156/83     SaO2: 95 RA  Tolerated very well, moving well. Likes RW and requests one for d/c. No c/o. To recliner. BP slightly elevated. [id number]   Harriet Masson CES, ACSM 08/17/2012 9:08 AM

## 2012-08-17 NOTE — Progress Notes (Signed)
PACING WIRES REMOVED PER ORDER. PT TOLERATED WELL. WIRES INTACT. VS OBTAINED PER PROTOCOL. PT DENIES PAIN, OR DISCOMFORT.

## 2012-08-17 NOTE — Progress Notes (Addendum)
      [address]       [place] [address]             [phone]        4 Days Post-Op Procedure(s) (LRB): CORONARY ARTERY BYPASS GRAFTING (CABG) (N/A) INTRAOPERATIVE TRANSESOPHAGEAL ECHOCARDIOGRAM (N/A) ENDOVEIN HARVEST OF GREATER SAPHENOUS VEIN (Right)  Subjective: Patient with some vague abdominal discomfort. He denies nausea, emesis. He has had bowel movements.   Objective: Vital signs in last 24 hours: Temp:  [98 F (36.7 C)-99.9 F (37.7 C)] 99.9 F (37.7 C) (08/04 0458) Pulse Rate:  [94-109] 94 (08/04 0458) Cardiac Rhythm:  [-] Normal sinus rhythm (08/03 2000) Resp:  [16-18] 18 (08/04 0458) BP: (97-126)/(69-87) 126/79 mmHg (08/04 0458) SpO2:  [93 %-95 %] 95 % (08/04 0458) Weight:  [101.606 kg (224 lb)] 101.606 kg (224 lb) (08/04 0458)  Pre op weight 100 kg Current Weight  08/17/12 101.606 kg (224 lb)      Intake/Output from previous day: 08/03 0701 - 08/04 0700 In: 600 [P.O.:600] Out: 400 [Urine:400]   Physical Exam:  Cardiovascular:Tachy Pulmonary: Clear to auscultation bilaterally; no rales, wheezes, or rhonchi. Abdomen: Soft, non tender, some distention,bowel sounds present. Extremities: Mild bilateral lower extremity edema. Wounds: Clean and dry.  No erythema or signs of infection.  Lab Results: CBC: Recent Labs  08/15/12 0300 08/17/12 0510  WBC 10.8* 7.1  HGB 11.5* 9.9*  HCT 33.3* 29.1*  PLT 162 208   BMET:  Recent Labs  08/15/12 0300 08/17/12 0510  NA 133* 135  K 4.0 3.7  CL 98 100  CO2 27 26  GLUCOSE 181* 107*  BUN 22 23  CREATININE 1.05 0.78  CALCIUM 8.6 8.7    PT/INR:  Lab Results  Component Value Date   INR 1.32 08/13/2012   INR 0.96 08/11/2012   INR 0.98 08/07/2012   ABG:  INR: Will add last result for INR, ABG once components are confirmed Will add last 4 CBG results once components are confirmed  Assessment/Plan:  1. CV - ST. On Lopressor 25 bid, Catapres 0.1 bid. Will restart low dose  Lisinopril for better bp control. Will change Lopressor to Toprol XL as taken pre op. 2.  Pulmonary - Encourage incentive spirometer. 3. Volume Overload - On Lasix 40 daily. 4.  Acute blood loss anemia - H and H 9.9 and 29.1 Start Ferrous. 5.Supplement potassium 6.DM-CBGs  156/156/117. On Levemir 25 bid, Amaryl 4 bid, and Metformin 1000 bid. Pre op HGA1C 7.8. 7.Remove EPW  8.Possible discharge in am  ZIMMERMAN,DONIELLE MPA-C 08/17/2012,7:46 AM  patient examined and medical record reviewed,agree with above note. VAN TRIGT III,PETER 08/17/2012

## 2012-08-17 NOTE — Discharge Summary (Signed)
Physician Discharge Summary       [address] [place]       [place] [address]             [phone]    Patient ID: George [name] MRN: [id number] DOB/AGE: 1945/08/18 67 y.o.  Admit date: 08/13/2012 Discharge date: 08/18/2012  Admission Diagnoses: 1. Multivessel CAD 2.History of hypercholesterolemia 3.History of DM 4.History of tobacco abuse 5.History of pleurisy 6.History of herpes zoster 7.History of OA  Discharge Diagnoses:  1. Multivessel CAD 2.History of hypercholesterolemia 3.History of DM 4.History of tobacco abuse 5.History of pleurisy 6.History of herpes zoster 7.History of OA   Procedure (s):  Cardiac Catheterization done by Dr. Eden Emms on 08/07/2012:  Coronary dominance: right  Left mainstem: 60% ,mid vessel stenosis  Left anterior descending (LAD): 50% ostial 40% mid 60-70% distal  IM: Large vessel with 90% ostial stenosis  D1: 70% ostial  D2: noral  Left circumflex (LCx): 30% mid  OM1: large inferior brach 90%  Right coronary artery (RCA): Dominant 30% distal and PLA  Left ventriculography: Left ventricular systolic function is normal, LVEF is estimated at 55-65%, there is no significant mitral regurgitation    2. Coronary artery bypass grafting x3 (left internal mammary artery LAD, saphenous vein graft to ramus intermediate, saphenous vein graft to OM2) with endoscopic harvest of right leg greater saphenous vein by Dr. Donata Clay on 08/13/2012.   History of Presenting Illness: This is a 67 year old hypertensive,diabetic former smoker with recent exertional chest discomfort while doing his job. He has had more episodes and longer episodes of pain that have increased in frequency and severity. He denies any resting symptoms. He has had associated lightheadedness and dizziness, but no syncope. He denies any orthopnea, PND, or ankle edema. There is no positive family history of MI or CABG, nor arrhythmia cardiac murmur, nor any history of rheumatic  heart disease. The patient had a stress echocardiogram by Dr. Purvis Sheffield in [place], which was positive for ischemia. He underwent cardiac catheterization via right radial artery on 08/07/2012. Results showed  showing moderate left main stenosis, a high-grade LAD stenosis, ramus and OM disease. LVEF was normal. A cardiothoracic consultation was obtained with Dr. Donata Clay for the consideration of coronary artery bypass grafting surgery. Potential risks, complications, and benefits of the surgery were discussed with the patient and he agreed to proceed. Pre operative carotid duplex US showed 0-39% bilateral carotid artery stenosis.He underwent a CABG x 3 on 7/31.   Brief Hospital Course:   The patient was extubated the afternoon of surgery without difficulty. He remained afebrile and hemodynamically stable. Theone Murdoch, a line, chest tubes, and foley were removed early in the post operative course. Lopressor was started and titrated accordingly. He  was volume over loaded and diuresed. He  was weaned off the insulin drip. Once he was tolerating a diet, home diabetic medicines were restarted. The patient's HGA1C pre op was  7.8. He has mild ABL anemia. His last H and H was 9.9 and 29.1. The patient was felt surgically stable for transfer from the ICU to PCTU for further convalescence on 08/15/2012. Hecontinues to progress with cardiac rehab. He  was ambulating on room air. He has been tolerating a diet and has had a bowel movement. Epicardial pacing wires and chest tube sutures will be removed prior to discharge. Provided the patient remains afebrile, hemodynamically stable, and pending morning round evaluation, He/she will be surgically stable for discharge on 08/17/2012.   Latest Vital Signs:  Blood pressure 126/80, pulse 104, temperature 98.5 F (36.9 C), temperature source Oral, resp. rate 18, height 6\' 4"  (1.93 m), weight 101.1 kg (222 lb 14.2 oz), SpO2 96.00%.  Physical Exam: Cardiovascular:Tachy    Pulmonary: Clear to auscultation bilaterally; no rales, wheezes, or rhonchi.  Abdomen: Soft, non tender, some distention,bowel sounds present.  Extremities: Mild bilateral lower extremity edema.  Wounds: Clean and dry. No erythema or signs of infection.   Discharge Condition:Stable  Recent laboratory studies:  Lab Results  Component Value Date   WBC 7.1 08/17/2012   HGB 9.9* 08/17/2012   HCT 29.1* 08/17/2012   MCV 82.9 08/17/2012   PLT 208 08/17/2012   Lab Results  Component Value Date   NA 135 08/17/2012   K 3.7 08/17/2012   CL 100 08/17/2012   CO2 26 08/17/2012   CREATININE 0.78 08/17/2012   GLUCOSE 107* 08/17/2012      Diagnostic Studies:   Dg Chest Port 1 View  09-10-12   *RADIOLOGY REPORT*  Clinical Data: Postop cardiac surgery.  PORTABLE CHEST - 1 VIEW  Comparison: 08/14/2012.  Findings: Interval removal of Swan-Ganz catheter and left chest tube.  Mild left-sided atelectasis and pleural effusion.  Trace left apical pneumothorax.  Stable heart size and mediastinal contours status post CABG.  These results were called by telephone on 09/10/12 at 0550 hours to RN Freida Busman, who verbally acknowledged these results.  IMPRESSION:  1.  Trace left apical pneumothorax status post chest tube removal. 2.  Left-sided atelectasis and pleural fluid appears stable from yesterday.   Original Report Authenticated By: Tiburcio Pea    Discharge Medications:   Medication List    STOP taking these medications       amLODipine 10 MG tablet  Commonly known as:  NORVASC     ibuprofen 200 MG tablet  Commonly known as:  ADVIL,MOTRIN     losartan 100 MG tablet  Commonly known as:  COZAAR     nitroGLYCERIN 0.4 MG SL tablet  Commonly known as:  NITROSTAT      TAKE these medications       aspirin 325 MG EC tablet  Take 1 tablet (325 mg total) by mouth daily.     atorvastatin 20 MG tablet  Commonly known as:  LIPITOR  Take 20 mg by mouth daily.     cloNIDine 0.1 MG tablet  Commonly known as:   CATAPRES  Take 0.1 mg by mouth 2 (two) times daily.     ferrous sulfate 325 (65 FE) MG tablet  Take 1 tablet (325 mg total) by mouth daily with breakfast. For one month then stop.     fexofenadine 180 MG tablet  Commonly known as:  ALLEGRA  Take 180 mg by mouth at bedtime.     fluticasone 50 MCG/ACT nasal spray  Commonly known as:  FLONASE  Place 2 sprays into the nose daily as needed for rhinitis or allergies.     furosemide 40 MG tablet  Commonly known as:  LASIX  Take 1 tablet (40 mg total) by mouth daily. For 4 days then stop.     glimepiride 4 MG tablet  Commonly known as:  AMARYL  Take 4 mg by mouth 2 (two) times daily.     insulin glargine 100 UNIT/ML injection  Commonly known as:  LANTUS  Inject 0.05-0.45 mLs (5-45 Units total) into the skin 2 (two) times daily. 30 units in the morning and 5-15 units at bedtime per sliding scale. Gradually increase morning  dose (as glucose increases) to pre surgery dose of 45 units.     lisinopril 2.5 MG tablet  Commonly known as:  PRINIVIL,ZESTRIL  Take 1 tablet (2.5 mg total) by mouth daily.     metFORMIN 500 MG tablet  Commonly known as:  GLUCOPHAGE  Take 1,000 mg by mouth 2 (two) times daily with a meal. Takes two tablets twice daily     metoprolol succinate 50 MG 24 hr tablet  Commonly known as:  TOPROL-XL  Take 1 tablet (50 mg total) by mouth daily. Take with or immediately following a meal.     omeprazole 20 MG capsule  Commonly known as:  PRILOSEC  Take 20 mg by mouth daily.     oxyCODONE 5 MG immediate release tablet  Commonly known as:  Oxy IR/ROXICODONE  Take 1-2 tablets (5-10 mg total) by mouth every 4 (four) hours as needed for pain.     potassium chloride SA 20 MEQ tablet  Commonly known as:  K-DUR,KLOR-CON  Take 1 tablet (20 mEq total) by mouth once. For 4 days then stop.         The patient has been discharged on:   1.Beta Blocker:  Yes [  x ]                              No   [   ]                               If No, reason:  2.Ace Inhibitor/ARB: Yes [ x  ]                                     No  [    ]                                     If No, reason:  3.Statin:   Yes [ x  ]                  No  [   ]                  If No, reason:  4.Ecasa:  Yes  [ x  ]                  No   [   ]                  If No, reason:  Follow Up Appointments: Follow-up Information   Follow up with VAN Dinah Beers, MD. (PA/LAT CXR to be taken (at [facility] which is in the same building as Dr. Zenaida Niece Tright's office) on 09/16/2012 at 9:30 am;Appointment with Dr. Donata Clay is on  09/16/2012 at 10:30 am)    Contact information:   [address] [place] [address] [phone]       Follow up with Charlton Haws, MD. (Call for a follow up appointment for 2 weeks)    Contact information:   [address] [place] [address] [phone]       Follow up with [facility], MD. (Call regarding further surveillance of HGA1C  7.8)    Contact information:   [address]  [place] [address] [phone]       Signed: Doree Fudge MPA-C 08/18/2012, 7:57 AM

## 2012-08-18 LAB — GLUCOSE, CAPILLARY
Glucose-Capillary: 151 mg/dL — ABNORMAL HIGH (ref 70–99)
Glucose-Capillary: 160 mg/dL — ABNORMAL HIGH (ref 70–99)

## 2012-08-18 MED ORDER — ASPIRIN 325 MG PO TBEC: 325.0000 mg | DELAYED_RELEASE_TABLET | Freq: Every day | ORAL | Status: DC

## 2012-08-18 MED ORDER — POTASSIUM CHLORIDE CRYS ER 20 MEQ PO TBCR
20.0000 meq | EXTENDED_RELEASE_TABLET | Freq: Once | ORAL | Status: AC
  Administered 2012-08-18: 20 meq via ORAL
  Filled 2012-08-18: qty 1

## 2012-08-18 MED ORDER — INSULIN GLARGINE 100 UNIT/ML ~~LOC~~ SOLN: 5.0000 [IU] | Freq: Two times a day (BID) | SUBCUTANEOUS | Status: DC

## 2012-08-18 MED ORDER — FUROSEMIDE 40 MG PO TABS: 40.0000 mg | ORAL_TABLET | Freq: Every day | ORAL | Status: DC

## 2012-08-18 MED ORDER — LISINOPRIL 2.5 MG PO TABS: 2.5000 mg | ORAL_TABLET | Freq: Every day | ORAL | Status: DC

## 2012-08-18 MED ORDER — INSULIN DETEMIR 100 UNIT/ML ~~LOC~~ SOLN
30.0000 [IU] | Freq: Two times a day (BID) | SUBCUTANEOUS | Status: DC
  Administered 2012-08-18: 30 [IU] via SUBCUTANEOUS
  Filled 2012-08-18 (×2): qty 0.3

## 2012-08-18 MED ORDER — OXYCODONE HCL 5 MG PO TABS: 5.0000 mg | ORAL_TABLET | ORAL | Status: DC | PRN

## 2012-08-18 MED ORDER — POTASSIUM CHLORIDE CRYS ER 20 MEQ PO TBCR: 20.0000 meq | EXTENDED_RELEASE_TABLET | Freq: Once | ORAL | Status: DC

## 2012-08-18 MED ORDER — FERROUS SULFATE 325 (65 FE) MG PO TABS: 325.0000 mg | ORAL_TABLET | Freq: Every day | ORAL | Status: DC

## 2012-08-18 MED ORDER — METOPROLOL SUCCINATE ER 50 MG PO TB24: 50.0000 mg | ORAL_TABLET | Freq: Every day | ORAL | Status: DC

## 2012-08-18 MED ORDER — FUROSEMIDE 40 MG PO TABS
40.0000 mg | ORAL_TABLET | Freq: Every day | ORAL | Status: DC
  Administered 2012-08-18: 40 mg via ORAL
  Filled 2012-08-18: qty 1

## 2012-08-18 NOTE — Progress Notes (Signed)
[id number] Education completed with pt and wife. Understanding voiced. Permission given to refer to [place] [facility] as he thinks about attending. Encouraged IS. Has seen post op video. Pt would like rolling walker for home use. Notified staff. Luetta Nutting RNBSN

## 2012-08-18 NOTE — Care Management Note (Signed)
    Page 1 of 1   08/18/2012     4:17:34 PM   CARE MANAGEMENT NOTE 08/18/2012  Patient:  George [name], George [name]   Account Number:  [id number]  Date Initiated:  08/13/2012  Documentation initiated by:  [facility]  Subjective/Objective Assessment:   CABG x3 post op     Action/Plan:   Anticipated DC Date:  08/18/2012   Anticipated DC Plan:  HOME W HOME HEALTH SERVICES      DC Planning Services  CM consult      Choice offered to / List presented to:     DME arranged  Levan Hurst      DME agency  [facility]        Status of service:  Completed, signed off Medicare Important Message given?   (If response is "NO", the following Medicare IM given date fields will be blank) Date Medicare IM given:   Date Additional Medicare IM given:    Discharge Disposition:  HOME/SELF CARE  Per UR Regulation:  Reviewed for med. necessity/level of care/duration of stay  If discussed at Long Length of Stay Meetings, dates discussed:    Comments:  ContactKeyvin, Rison Spouse [phone]   [phone]                 Alireza, Pollack [phone]   08/18/12 Mellonie Guess,RN,BSN [phone] REQUESTS RW FOR HOME.  REFERRAL TO [facility] FOR DME NEEDS.

## 2012-08-18 NOTE — Progress Notes (Signed)
PT DISCHARGE INSTRUCTIONS REVIEWED, PRESCRIPTIONS GIVEN TO WIFE. PT AND WIFE VU DISCHARGE INSTRUCTIONS. INFORMED PT OF S/S OF INFECTION, SOB,INCREASED PAIN WITHOUT RELIEF TO CALL MD OR COME TO HOSPITAL. PT LEFT UNIT VIA WC WITH WIFE. NO DISTRESS NOTED.

## 2012-08-18 NOTE — Progress Notes (Signed)
      [address]       [place] [address]             [phone]        5 Days Post-Op Procedure(s) (LRB): CORONARY ARTERY BYPASS GRAFTING (CABG) (N/A) INTRAOPERATIVE TRANSESOPHAGEAL ECHOCARDIOGRAM (N/A) ENDOVEIN HARVEST OF GREATER SAPHENOUS VEIN (Right)  Subjective: Patient feels fairly well. Has occasional abdominal discomfort (possibly gas), but has had multiple bowel movements. He denies nausea or emesis.  Objective: Vital signs in last 24 hours: Temp:  [97.9 F (36.6 C)-99.5 F (37.5 C)] 98.5 F (36.9 C) (08/05 0450) Pulse Rate:  [100-109] 104 (08/05 0450) Cardiac Rhythm:  [-] Sinus tachycardia (08/04 2017) Resp:  [18-20] 18 (08/05 0450) BP: (100-161)/(67-82) 126/80 mmHg (08/05 0450) SpO2:  [93 %-96 %] 96 % (08/05 0450) Weight:  [101.1 kg (222 lb 14.2 oz)] 101.1 kg (222 lb 14.2 oz) (08/05 0450)  Pre op weight 100 kg Current Weight  08/18/12 101.1 kg (222 lb 14.2 oz)      Intake/Output from previous day: 08/04 0701 - 08/05 0700 In: 480 [P.O.:480] Out: -    Physical Exam:  Cardiovascular:Tachy Pulmonary: Clear to auscultation bilaterally; no rales, wheezes, or rhonchi. Abdomen: Soft, non tender, some distention,bowel sounds present. Extremities: Mild bilateral lower extremity edema. Wounds: Clean and dry.  No erythema or signs of infection.  Lab Results: CBC:  Recent Labs  08/17/12 0510  WBC 7.1  HGB 9.9*  HCT 29.1*  PLT 208   BMET:   Recent Labs  08/17/12 0510  NA 135  K 3.7  CL 100  CO2 26  GLUCOSE 107*  BUN 23  CREATININE 0.78  CALCIUM 8.7    PT/INR:  Lab Results  Component Value Date   INR 1.32 08/13/2012   INR 0.96 08/11/2012   INR 0.98 08/07/2012   ABG:  INR: Will add last result for INR, ABG once components are confirmed Will add last 4 CBG results once components are confirmed  Assessment/Plan:  1. CV - ST/SR. On Toprol XL 50 daily,Lisinopril 2.5 daily, and Catapres 0.1 bid.  2.  Pulmonary -  Encourage incentive spirometer. 3. Volume Overload - On Lasix 40 daily. 4.  Acute blood loss anemia - H and H 9.9 and 29.1 On Ferrous. 5.DM-CBGs  161/151/160. On Levemir 27 bid, Amaryl 4 bid, and Metformin 1000 bid. Pre op HGA1C 7.8. Will increase Levemir for better glucose control. am 6.Discharge at lunch  Cainan Trull MPA-C 08/18/2012,7:37 AM

## 2012-08-18 NOTE — Discharge Summary (Signed)
patient examined and medical record reviewed,agree with above note. George [name],George [name] 08/18/2012

## 2012-08-24 ENCOUNTER — Other Ambulatory Visit: Payer: Self-pay | Admitting: *Deleted

## 2012-08-24 DIAGNOSIS — G8918 Other acute postprocedural pain: Secondary | ICD-10-CM

## 2012-08-24 MED ORDER — HYDROCODONE-ACETAMINOPHEN 7.5-325 MG PO TABS: 1.0000 | ORAL_TABLET | ORAL | Status: DC | PRN

## 2012-08-31 ENCOUNTER — Other Ambulatory Visit: Payer: Self-pay | Admitting: *Deleted

## 2012-08-31 DIAGNOSIS — G8918 Other acute postprocedural pain: Secondary | ICD-10-CM

## 2012-08-31 MED ORDER — HYDROCODONE-ACETAMINOPHEN 7.5-325 MG PO TABS: 1.0000 | ORAL_TABLET | ORAL | Status: DC | PRN

## 2012-09-07 ENCOUNTER — Other Ambulatory Visit: Payer: Self-pay | Admitting: *Deleted

## 2012-09-07 DIAGNOSIS — G8918 Other acute postprocedural pain: Secondary | ICD-10-CM

## 2012-09-07 MED ORDER — HYDROCODONE-ACETAMINOPHEN 7.5-325 MG PO TABS: 1.0000 | ORAL_TABLET | ORAL | Status: DC | PRN

## 2012-09-15 ENCOUNTER — Other Ambulatory Visit: Payer: Self-pay | Admitting: *Deleted

## 2012-09-15 DIAGNOSIS — I2510 Atherosclerotic heart disease of native coronary artery without angina pectoris: Secondary | ICD-10-CM

## 2012-09-16 ENCOUNTER — Other Ambulatory Visit: Payer: Self-pay | Admitting: *Deleted

## 2012-09-16 ENCOUNTER — Encounter: Payer: Self-pay | Admitting: Cardiothoracic Surgery

## 2012-09-16 ENCOUNTER — Ambulatory Visit
Admission: RE | Admit: 2012-09-16 | Discharge: 2012-09-16 | Disposition: A | Discharge status: Home / Self Care | Payer: Medicare Other | Source: Ambulatory Visit | Attending: Cardiothoracic Surgery | Admitting: Cardiothoracic Surgery

## 2012-09-16 ENCOUNTER — Ambulatory Visit (INDEPENDENT_AMBULATORY_CARE_PROVIDER_SITE_OTHER): Payer: Self-pay | Admitting: Cardiothoracic Surgery

## 2012-09-16 VITALS — BP 130/85 | HR 96 | Resp 20 | Ht 76.0 in | Wt 221.0 lb

## 2012-09-16 DIAGNOSIS — I2510 Atherosclerotic heart disease of native coronary artery without angina pectoris: Secondary | ICD-10-CM

## 2012-09-16 DIAGNOSIS — Z951 Presence of aortocoronary bypass graft: Secondary | ICD-10-CM

## 2012-09-16 DIAGNOSIS — G8918 Other acute postprocedural pain: Secondary | ICD-10-CM

## 2012-09-16 MED ORDER — HYDROCODONE-ACETAMINOPHEN 7.5-325 MG PO TABS: 1.0000 | ORAL_TABLET | Freq: Three times a day (TID) | ORAL | Status: DC | PRN

## 2012-09-16 NOTE — Progress Notes (Signed)
PCP is Kirstie Peri, MD Referring Provider is [place], Peter M, MD  Chief Complaint  Patient presents with  . Routine Post Op    F/U from surgery with CXR, S/P CABG x 3 on 08/13/12    HPI: One month followup after urgent CABG for unstable angina. Patient doing well at home without recurrent symptoms, surgical incisions are all healing well. No symptoms of fluid retention or CHF. Patient ready to drive an increase activity level. Patient works as a Soil scientist and understands he will not be able to perform heavy activities until 3 months after surgery.  Past Medical History  Diagnosis Date  . Generalized osteoarthrosis, unspecified site   . Pure hypercholesterolemia     takes Atorvastatin daily  . Herpes zoster without mention of complication   . Diabetic neuropathy   . Psoriasis and similar disorder   . Sinusitis     takes Allegra nightly and FLonase prn  . Lumbago   . Chest pain   . Acute upper respiratory infections of unspecified site   . Dermatophytosis of the body   . Pleurisy without mention of effusion or current tuberculosis   . Colitis, enteritis, and gastroenteritis of presumed infectious origin   . Joint pain   . Coronary artery disease   . Other dyspnea and respiratory abnormality     with exertion  . History of bronchitis many yrs ago  . Dizziness   . Confusion     occasionally short term  . Chronic back pain     buldging disc and inflammatory arthritis  . Psoriasis   . GERD (gastroesophageal reflux disease)     takes Omeprazole daily  . Unspecified essential hypertension     takes Amlodipine,Losartan,CLonidine,Lisinopril,and Metoprolol daily  . Diabetes mellitus without complication     takes Lantus and metformina and amaryl daily    Past Surgical History  Procedure Laterality Date  . Circumcision  2010  . Coronary artery bypass graft N/A 08/13/2012    Procedure: CORONARY ARTERY BYPASS GRAFTING (CABG);  Surgeon: Kerin Perna, MD;  Location: [facility] OR;  Service:  Open Heart Surgery;  Laterality: N/A;  . Intraoperative transesophageal echocardiogram N/A 08/13/2012    Procedure: INTRAOPERATIVE TRANSESOPHAGEAL ECHOCARDIOGRAM;  Surgeon: Kerin Perna, MD;  Location: [facility] OR;  Service: Open Heart Surgery;  Laterality: N/A;  . Endovein harvest of greater saphenous vein Right 08/13/2012    Procedure: ENDOVEIN HARVEST OF GREATER SAPHENOUS VEIN;  Surgeon: Kerin Perna, MD;  Location: [facility] OR;  Service: Open Heart Surgery;  Laterality: Right;    No family history on file.  Social History History  Substance Use Topics  . Smoking status: Never Smoker   . Smokeless tobacco: Not on file  . Alcohol Use: No    Current Outpatient Prescriptions  Medication Sig Dispense Refill  . aspirin EC 325 MG EC tablet Take 1 tablet (325 mg total) by mouth daily.  30 tablet  0  . atorvastatin (LIPITOR) 20 MG tablet Take 20 mg by mouth daily.      . cloNIDine (CATAPRES) 0.1 MG tablet Take 0.1 mg by mouth 2 (two) times daily.      . fexofenadine (ALLEGRA) 180 MG tablet Take 180 mg by mouth at bedtime.      . fluticasone (FLONASE) 50 MCG/ACT nasal spray Place 2 sprays into the nose daily as needed for rhinitis or allergies.       Marland Kitchen glimepiride (AMARYL) 4 MG tablet Take 4 mg by mouth 2 (two) times daily.      Marland Kitchen  HYDROcodone-acetaminophen (NORCO) 7.5-325 MG per tablet Take 1 tablet by mouth every 8 (eight) hours as needed for pain.  40 tablet  0  . insulin glargine (LANTUS) 100 UNIT/ML injection Inject 0.05-0.45 mLs (5-45 Units total) into the skin 2 (two) times daily. 30 units in the morning and 5-15 units at bedtime per sliding scale. Gradually increase morning dose (as glucose increases) to pre surgery dose of 45 units.  10 mL  12  . lisinopril (PRINIVIL,ZESTRIL) 2.5 MG tablet Take 1 tablet (2.5 mg total) by mouth daily.  30 tablet  1  . metFORMIN (GLUCOPHAGE) 500 MG tablet Take 1,000 mg by mouth 2 (two) times daily with a meal. Takes two tablets twice daily      . metoprolol  succinate (TOPROL-XL) 50 MG 24 hr tablet Take 1 tablet (50 mg total) by mouth daily. Take with or immediately following a meal.  30 tablet  1  . omeprazole (PRILOSEC) 20 MG capsule Take 20 mg by mouth daily.       No current facility-administered medications for this visit.    No Known Allergies  Review of Systems improved exercise tolerance and appetite, taking minimal narcotic pain medication for incisional pain  BP 130/85  Pulse 96  Resp 20  Ht 6\' 4"  (1.93 m)  Wt 221 lb (100.245 kg)  BMI 26.91 kg/m2  SpO2 96% Physical Exam Alert and comfortable Lungs clear Sternal incision clean and dry Heart rhythm regular murmur or gallop No pedal edema  Diagnostic Tests:  Chest x-ray clear, no pleural effusion Impression: Excellent 4 week recovery Patient may drive and do normal daily activities Patient understands not to lift more than 20 pounds until 3 months after surgery No new meds  Plan: Followup with cardiology in [place] office

## 2012-09-23 ENCOUNTER — Other Ambulatory Visit: Payer: Self-pay

## 2012-09-23 DIAGNOSIS — G8918 Other acute postprocedural pain: Secondary | ICD-10-CM

## 2012-09-23 MED ORDER — HYDROCODONE-ACETAMINOPHEN 7.5-325 MG PO TABS: 1.0000 | ORAL_TABLET | Freq: Three times a day (TID) | ORAL | Status: DC | PRN

## 2012-09-23 NOTE — Telephone Encounter (Signed)
RX refill called to pharm for Norco 7.5/325 mg #40, no refills

## 2012-10-01 ENCOUNTER — Other Ambulatory Visit: Payer: Self-pay | Admitting: *Deleted

## 2012-10-01 DIAGNOSIS — G8918 Other acute postprocedural pain: Secondary | ICD-10-CM

## 2012-10-01 MED ORDER — HYDROCODONE-ACETAMINOPHEN 7.5-325 MG PO TABS: 1.0000 | ORAL_TABLET | ORAL | Status: DC | PRN

## 2012-10-12 ENCOUNTER — Other Ambulatory Visit: Payer: Self-pay | Admitting: *Deleted

## 2012-10-12 DIAGNOSIS — G8918 Other acute postprocedural pain: Secondary | ICD-10-CM

## 2012-10-12 MED ORDER — HYDROCODONE-ACETAMINOPHEN 7.5-325 MG PO TABS: 1.0000 | ORAL_TABLET | ORAL | Status: DC | PRN

## 2012-10-15 ENCOUNTER — Encounter (HOSPITAL_COMMUNITY): Payer: Medicare Other

## 2012-10-19 ENCOUNTER — Other Ambulatory Visit: Payer: Self-pay | Admitting: *Deleted

## 2012-10-26 ENCOUNTER — Encounter: Payer: Self-pay | Admitting: Cardiovascular Disease

## 2012-10-26 ENCOUNTER — Ambulatory Visit (INDEPENDENT_AMBULATORY_CARE_PROVIDER_SITE_OTHER): Payer: Medicare Other | Admitting: Cardiovascular Disease

## 2012-10-26 VITALS — BP 112/71 | HR 67 | Ht 76.0 in | Wt 214.0 lb

## 2012-10-26 DIAGNOSIS — I25810 Atherosclerosis of coronary artery bypass graft(s) without angina pectoris: Secondary | ICD-10-CM

## 2012-10-26 DIAGNOSIS — I10 Essential (primary) hypertension: Secondary | ICD-10-CM

## 2012-10-26 DIAGNOSIS — R079 Chest pain, unspecified: Secondary | ICD-10-CM

## 2012-10-26 DIAGNOSIS — Z951 Presence of aortocoronary bypass graft: Secondary | ICD-10-CM

## 2012-10-26 NOTE — Patient Instructions (Signed)
Continue all current medications. Follow up in  3-4 months.  

## 2012-10-26 NOTE — Progress Notes (Signed)
Patient ID: George [name], male   DOB: [birth year], 67 y.o.   MRN: [id number]      SUBJECTIVE: George [name] is here for f/u today after undergoing 3-v CABG on 08/13/12. He is doing very well, and has no anginal chest pain, shortness of breath, or leg swelling. He has some mild chest soreness, particularly when he coughs.    No Known Allergies  Current Outpatient Prescriptions  Medication Sig Dispense Refill  . aspirin EC 325 MG EC tablet Take 1 tablet (325 mg total) by mouth daily.  30 tablet  0  . cloNIDine (CATAPRES) 0.1 MG tablet Take 0.1 mg by mouth 2 (two) times daily.      . fexofenadine (ALLEGRA) 180 MG tablet Take 180 mg by mouth at bedtime.      Marland Kitchen glimepiride (AMARYL) 4 MG tablet Take 4 mg by mouth 2 (two) times daily.      Marland Kitchen losartan (COZAAR) 100 MG tablet Take 1 tablet by mouth daily.      . metFORMIN (GLUCOPHAGE) 500 MG tablet Take 1,000 mg by mouth 2 (two) times daily with a meal.       . metoprolol succinate (TOPROL-XL) 50 MG 24 hr tablet Take 1 tablet (50 mg total) by mouth daily. Take with or immediately following a meal.  30 tablet  1  . amLODipine (NORVASC) 10 MG tablet Take 1 tablet by mouth daily.      Marland Kitchen atorvastatin (LIPITOR) 20 MG tablet Take 20 mg by mouth daily.      . fluticasone (FLONASE) 50 MCG/ACT nasal spray Place 2 sprays into the nose daily as needed for rhinitis or allergies.       Marland Kitchen HYDROcodone-acetaminophen (NORCO) 7.5-325 MG per tablet Take 1 tablet by mouth every 4 (four) hours as needed for pain.  40 tablet  0  . insulin glargine (LANTUS) 100 UNIT/ML injection Inject 0.05-0.45 mLs (5-45 Units total) into the skin 2 (two) times daily. 30 units in the morning and 5-15 units at bedtime per sliding scale. Gradually increase morning dose (as glucose increases) to pre surgery dose of 45 units.  10 mL  12  . lisinopril (PRINIVIL,ZESTRIL) 2.5 MG tablet Take 1 tablet (2.5 mg total) by mouth daily.  30 tablet  1  . omeprazole (PRILOSEC) 20 MG capsule Take 20 mg by mouth  daily.       No current facility-administered medications for this visit.    Past Medical History  Diagnosis Date  . Generalized osteoarthrosis, unspecified site   . Pure hypercholesterolemia     takes Atorvastatin daily  . Herpes zoster without mention of complication   . Diabetic neuropathy   . Psoriasis and similar disorder   . Sinusitis     takes Allegra nightly and FLonase prn  . Lumbago   . Chest pain   . Acute upper respiratory infections of unspecified site   . Dermatophytosis of the body   . Pleurisy without mention of effusion or current tuberculosis   . Colitis, enteritis, and gastroenteritis of presumed infectious origin   . Joint pain   . Coronary artery disease   . Other dyspnea and respiratory abnormality     with exertion  . History of bronchitis many yrs ago  . Dizziness   . Confusion     occasionally short term  . Chronic back pain     buldging disc and inflammatory arthritis  . Psoriasis   . GERD (gastroesophageal reflux disease)     takes  Omeprazole daily  . Unspecified essential hypertension     takes Amlodipine,Losartan,CLonidine,Lisinopril,and Metoprolol daily  . Diabetes mellitus without complication     takes Lantus and metformina and amaryl daily    Past Surgical History  Procedure Laterality Date  . Circumcision  2010  . Coronary artery bypass graft N/A 08/13/2012    Procedure: CORONARY ARTERY BYPASS GRAFTING (CABG);  Surgeon: Kerin Perna, MD;  Location: [facility] OR;  Service: Open Heart Surgery;  Laterality: N/A;  . Intraoperative transesophageal echocardiogram N/A 08/13/2012    Procedure: INTRAOPERATIVE TRANSESOPHAGEAL ECHOCARDIOGRAM;  Surgeon: Kerin Perna, MD;  Location: [facility] OR;  Service: Open Heart Surgery;  Laterality: N/A;  . Endovein harvest of greater saphenous vein Right 08/13/2012    Procedure: ENDOVEIN HARVEST OF GREATER SAPHENOUS VEIN;  Surgeon: Kerin Perna, MD;  Location: [facility] OR;  Service: Open Heart Surgery;  Laterality: Right;     History   Social History  . Marital Status: Married    Spouse Name: N/A    Number of Children: N/A  . Years of Education: N/A   Occupational History  . Not on file.   Social History Main Topics  . Smoking status: Never Smoker   . Smokeless tobacco: Never Used  . Alcohol Use: No  . Drug Use: No  . Sexual Activity: Yes   Other Topics Concern  . Not on file   Social History Narrative  . No narrative on file     Filed Vitals:   10/26/12 0824  Height: 6\' 4"  (1.93 m)  Weight: 214 lb (97.07 kg)    PHYSICAL EXAM General: NAD Neck: No JVD, no thyromegaly or thyroid nodule.  Lungs: Clear to auscultation bilaterally with normal respiratory effort. CV: Well healing midline incision. Nondisplaced PMI.  Heart regular S1/S2, no S3/S4, no murmur.  No peripheral edema.  No carotid bruit.  Normal pedal pulses.  Abdomen: Soft, nontender, no hepatosplenomegaly, no distention.  Neurologic: Alert and oriented x 3.  Psych: Normal affect. Extremities: No clubbing or cyanosis.   ECG: reviewed and available in electronic records. (NSR, 71 bpm, nonspecific T wave abnormality)      ASSESSMENT AND PLAN: 1. CAD s/p 3-v CABG: symptomatically stable. No medication changes. He has been advised regarding the avoidance of strenuous labor until 3 months have elapsed. He will f/u with CT surgery (Dr. Morton Peters) at the end of the month. 2. HTN: controlled on current therapy.  Dispo: f/u in 3 months.  Prentice Docker, M.D., F.A.C.C.

## 2012-10-29 ENCOUNTER — Encounter (HOSPITAL_COMMUNITY): Payer: Self-pay

## 2012-10-29 ENCOUNTER — Encounter (HOSPITAL_COMMUNITY)
Admission: RE | Admit: 2012-10-29 | Discharge: 2012-10-29 | Disposition: A | Discharge status: Home / Self Care | Payer: Medicare Other | Source: Ambulatory Visit | Attending: Cardiology | Admitting: Cardiology

## 2012-10-29 VITALS — BP 120/70 | HR 73 | Ht 72.0 in | Wt 214.8 lb

## 2012-10-29 DIAGNOSIS — Z951 Presence of aortocoronary bypass graft: Secondary | ICD-10-CM | POA: Insufficient documentation

## 2012-10-29 DIAGNOSIS — I25810 Atherosclerosis of coronary artery bypass graft(s) without angina pectoris: Secondary | ICD-10-CM

## 2012-10-29 DIAGNOSIS — Z5189 Encounter for other specified aftercare: Secondary | ICD-10-CM | POA: Insufficient documentation

## 2012-10-29 NOTE — Progress Notes (Addendum)
Mr. Wilemon was referred to CR by Dr. Daleen Squibb and is being followed by Dr. Purvis Sheffield due to CABGx3 414.04. During orientation advised patient on arrival and appointment times what to wear, what to do before, during and after exercise. Reviewed attendance and class policy. Talked about inclement weather and class consultation policy. Pt is scheduled to start Cardiac Rehab on 11/02/12 at 6:45 am. Pt was advised to come to class 5 minutes before class starts. He was also given instructions on meeting with the dietician and attending the Family Structure classes. Pt is eager to get started. Patient was able to complete the six minute walk test.

## 2012-10-29 NOTE — Patient Instructions (Signed)
Pt has finished orientation and is scheduled to start CR on 11/02/12 at 6:45 am. Pt has been instructed to arrive to class 15 minutes early for scheduled class. Pt has been instructed to wear comfortable clothing and shoes with rubber soles. Pt has been told to take their medications 1 hour prior to coming to class.  If the patient is not going to attend class, he/she has been instructed to call.

## 2012-11-02 ENCOUNTER — Encounter (HOSPITAL_COMMUNITY)
Admission: RE | Admit: 2012-11-02 | Discharge: 2012-11-02 | Disposition: A | Discharge status: Home / Self Care | Payer: Medicare Other | Source: Ambulatory Visit | Attending: Cardiology | Admitting: Cardiology

## 2012-11-04 ENCOUNTER — Encounter (HOSPITAL_COMMUNITY)
Admission: RE | Admit: 2012-11-04 | Discharge: 2012-11-04 | Disposition: A | Discharge status: Home / Self Care | Payer: Medicare Other | Source: Ambulatory Visit | Attending: Cardiology | Admitting: Cardiology

## 2012-11-06 ENCOUNTER — Encounter (HOSPITAL_COMMUNITY)
Admission: RE | Admit: 2012-11-06 | Discharge: 2012-11-06 | Disposition: A | Discharge status: Home / Self Care | Payer: Medicare Other | Source: Ambulatory Visit | Attending: Cardiology | Admitting: Cardiology

## 2012-11-09 ENCOUNTER — Encounter (HOSPITAL_COMMUNITY)
Admission: RE | Admit: 2012-11-09 | Discharge: 2012-11-09 | Disposition: A | Discharge status: Home / Self Care | Payer: Medicare Other | Source: Ambulatory Visit | Attending: Cardiology | Admitting: Cardiology

## 2012-11-11 ENCOUNTER — Encounter (HOSPITAL_COMMUNITY)
Admission: RE | Admit: 2012-11-11 | Discharge: 2012-11-11 | Disposition: A | Discharge status: Home / Self Care | Payer: Medicare Other | Source: Ambulatory Visit | Attending: Cardiology | Admitting: Cardiology

## 2012-11-11 ENCOUNTER — Encounter: Payer: Self-pay | Admitting: Cardiothoracic Surgery

## 2012-11-11 ENCOUNTER — Ambulatory Visit (INDEPENDENT_AMBULATORY_CARE_PROVIDER_SITE_OTHER): Payer: Self-pay | Admitting: Cardiothoracic Surgery

## 2012-11-11 VITALS — BP 150/76 | HR 74 | Resp 19 | Ht 73.0 in | Wt 212.0 lb

## 2012-11-11 DIAGNOSIS — I2510 Atherosclerotic heart disease of native coronary artery without angina pectoris: Secondary | ICD-10-CM

## 2012-11-11 NOTE — Progress Notes (Signed)
PCP is Kirstie Peri, MD Referring Provider is [place], Peter M, MD  Chief Complaint  Patient presents with  . Follow-up    F/U from surgery with CXR, S/P CABG x 3 on 08/13/12     HPI: Patient returns for 3 month followup after multivessel CAD. Patient doing very well participating in cardiac rehabilitation walking over a mile a day without recurrent symptoms. Surgical incision is healed. Exam is unremarkable. 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 He will be able  lift up to 50 pounds this time.  Past Medical History  Diagnosis Date  . Generalized osteoarthrosis, unspecified site   . Pure hypercholesterolemia     takes Atorvastatin daily  . Herpes zoster without mention of complication   . Diabetic neuropathy   . Psoriasis and similar disorder   . Sinusitis     takes Allegra nightly and FLonase prn  . Lumbago   . Chest pain   . Acute upper respiratory infections of unspecified site   . Dermatophytosis of the body   . Pleurisy without mention of effusion or current tuberculosis   . Colitis, enteritis, and gastroenteritis of presumed infectious origin   . Joint pain   . Coronary artery disease   . Other dyspnea and respiratory abnormality     with exertion  . History of bronchitis many yrs ago  . Dizziness   . Confusion     occasionally short term  . Chronic back pain     buldging disc and inflammatory arthritis  . Psoriasis   . GERD (gastroesophageal reflux disease)     takes Omeprazole daily  . Unspecified essential hypertension     takes Amlodipine,Losartan,CLonidine,Lisinopril,and Metoprolol daily  . Diabetes mellitus without complication     takes Lantus and metformina and amaryl daily    Past Surgical History  Procedure Laterality Date  . Circumcision  2010  . Coronary artery bypass graft N/A 08/13/2012    Procedure: CORONARY ARTERY BYPASS GRAFTING (CABG);  Surgeon: Kerin Perna, MD;  Location: [facility] OR;  Service: Open Heart Surgery;  Laterality:  N/A;  . Intraoperative transesophageal echocardiogram N/A 08/13/2012    Procedure: INTRAOPERATIVE TRANSESOPHAGEAL ECHOCARDIOGRAM;  Surgeon: Kerin Perna, MD;  Location: [facility] OR;  Service: Open Heart Surgery;  Laterality: N/A;  . Endovein harvest of greater saphenous vein Right 08/13/2012    Procedure: ENDOVEIN HARVEST OF GREATER SAPHENOUS VEIN;  Surgeon: Kerin Perna, MD;  Location: [facility] OR;  Service: Open Heart Surgery;  Laterality: Right;    No family history on file.  Social History History  Substance Use Topics  . Smoking status: Never Smoker   . Smokeless tobacco: Never Used  . Alcohol Use: No    Current Outpatient Prescriptions  Medication Sig Dispense Refill  . amLODipine (NORVASC) 10 MG tablet Take 1 tablet by mouth daily.      Marland Kitchen aspirin EC 325 MG EC tablet Take 1 tablet (325 mg total) by mouth daily.  30 tablet  0  . atorvastatin (LIPITOR) 20 MG tablet Take 20 mg by mouth daily.      . cloNIDine (CATAPRES) 0.1 MG tablet Take 0.1 mg by mouth 2 (two) times daily.      . fexofenadine (ALLEGRA) 180 MG tablet Take 180 mg by mouth at bedtime.      . fluticasone (FLONASE) 50 MCG/ACT nasal spray Place 2 sprays into the nose daily as needed for rhinitis or allergies.       Marland Kitchen glimepiride (AMARYL)  4 MG tablet Take 4 mg by mouth 2 (two) times daily.      . insulin glargine (LANTUS) 100 UNIT/ML injection Inject 0.05-0.45 mLs (5-45 Units total) into the skin 2 (two) times daily. 30 units in the morning and 5-15 units at bedtime per sliding scale. Gradually increase morning dose (as glucose increases) to pre surgery dose of 45 units.  10 mL  12  . losartan (COZAAR) 100 MG tablet Take 1 tablet by mouth daily.      . metFORMIN (GLUCOPHAGE) 500 MG tablet Take 1,000 mg by mouth 2 (two) times daily with a meal.       . metoprolol succinate (TOPROL-XL) 50 MG 24 hr tablet Take 1 tablet (50 mg total) by mouth daily. Take with or immediately following a meal.  30 tablet  1  . omeprazole (PRILOSEC) 20  MG capsule Take 20 mg by mouth daily.      Marland Kitchen UNIFINE PENTIPS 31G X 6 MM MISC        No current facility-administered medications for this visit.    No Known Allergies  Review of Systems Telemetry monitor neuro rehabilitation shows him to be in sinus rhythm. Exercise blood pressures 134/72 heart rate of 90, heart rate at rest is 55 and recovery heart rate is 71.  BP 150/76  Pulse 74  Resp 19  Ht 6\' 1"  (1.854 m)  Wt 212 lb (96.163 kg)  BMI 27.98 kg/m2  SpO2 97% Physical Exam Alert and comfortable Lungs clear Cardiac rhythm regular without murmur or gallop Sternal incision well-healed No pedal edema  Diagnostic Tests: No chest x-ray done today  Impression: Excellent recovery 3 months after CABG. 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 Importance of continuing aspirin and Lipitor for graft patency is reviewed with patient.  Plan: Okay to return to work. Return as needed.

## 2012-11-13 ENCOUNTER — Encounter (HOSPITAL_COMMUNITY)
Admission: RE | Admit: 2012-11-13 | Discharge: 2012-11-13 | Disposition: A | Discharge status: Home / Self Care | Payer: Medicare Other | Source: Ambulatory Visit | Attending: Cardiology | Admitting: Cardiology

## 2012-11-16 ENCOUNTER — Encounter (HOSPITAL_COMMUNITY)
Admission: RE | Admit: 2012-11-16 | Discharge: 2012-11-16 | Disposition: A | Discharge status: Home / Self Care | Payer: Medicare Other | Source: Ambulatory Visit | Attending: Cardiology | Admitting: Cardiology

## 2012-11-16 DIAGNOSIS — Z5189 Encounter for other specified aftercare: Secondary | ICD-10-CM | POA: Insufficient documentation

## 2012-11-16 DIAGNOSIS — Z951 Presence of aortocoronary bypass graft: Secondary | ICD-10-CM | POA: Insufficient documentation

## 2012-11-18 ENCOUNTER — Encounter (HOSPITAL_COMMUNITY): Payer: Medicare Other

## 2012-11-20 ENCOUNTER — Encounter (HOSPITAL_COMMUNITY)
Admission: RE | Admit: 2012-11-20 | Discharge: 2012-11-20 | Disposition: A | Discharge status: Home / Self Care | Payer: Medicare Other | Source: Ambulatory Visit | Attending: Cardiology | Admitting: Cardiology

## 2012-11-23 ENCOUNTER — Encounter (HOSPITAL_COMMUNITY)
Admission: RE | Admit: 2012-11-23 | Discharge: 2012-11-23 | Disposition: A | Discharge status: Home / Self Care | Payer: Medicare Other | Source: Ambulatory Visit | Attending: Cardiology | Admitting: Cardiology

## 2012-11-25 ENCOUNTER — Encounter (HOSPITAL_COMMUNITY)
Admission: RE | Admit: 2012-11-25 | Discharge: 2012-11-25 | Disposition: A | Discharge status: Home / Self Care | Payer: Medicare Other | Source: Ambulatory Visit | Attending: Cardiology | Admitting: Cardiology

## 2012-11-27 ENCOUNTER — Encounter (HOSPITAL_COMMUNITY)
Admission: RE | Admit: 2012-11-27 | Discharge: 2012-11-27 | Disposition: A | Discharge status: Home / Self Care | Payer: Medicare Other | Source: Ambulatory Visit | Attending: Cardiology | Admitting: Cardiology

## 2012-11-30 ENCOUNTER — Encounter (HOSPITAL_COMMUNITY)
Admission: RE | Admit: 2012-11-30 | Discharge: 2012-11-30 | Disposition: A | Discharge status: Home / Self Care | Payer: Medicare Other | Source: Ambulatory Visit | Attending: Cardiology | Admitting: Cardiology

## 2012-12-02 ENCOUNTER — Encounter (HOSPITAL_COMMUNITY)
Admission: RE | Admit: 2012-12-02 | Discharge: 2012-12-02 | Disposition: A | Discharge status: Home / Self Care | Payer: Medicare Other | Source: Ambulatory Visit | Attending: Cardiology | Admitting: Cardiology

## 2012-12-04 ENCOUNTER — Encounter (HOSPITAL_COMMUNITY)
Admission: RE | Admit: 2012-12-04 | Discharge: 2012-12-04 | Disposition: A | Discharge status: Home / Self Care | Payer: Medicare Other | Source: Ambulatory Visit | Attending: Cardiology | Admitting: Cardiology

## 2012-12-07 ENCOUNTER — Encounter (HOSPITAL_COMMUNITY)
Admission: RE | Admit: 2012-12-07 | Discharge: 2012-12-07 | Disposition: A | Discharge status: Home / Self Care | Payer: Medicare Other | Source: Ambulatory Visit | Attending: Cardiology | Admitting: Cardiology

## 2012-12-09 ENCOUNTER — Encounter (HOSPITAL_COMMUNITY)
Admission: RE | Admit: 2012-12-09 | Discharge: 2012-12-09 | Disposition: A | Discharge status: Home / Self Care | Payer: Medicare Other | Source: Ambulatory Visit | Attending: Cardiology | Admitting: Cardiology

## 2012-12-11 ENCOUNTER — Encounter (HOSPITAL_COMMUNITY): Payer: Medicare Other

## 2012-12-14 ENCOUNTER — Encounter (HOSPITAL_COMMUNITY)
Admission: RE | Admit: 2012-12-14 | Discharge: 2012-12-14 | Disposition: A | Discharge status: Home / Self Care | Payer: Medicare Other | Source: Ambulatory Visit | Attending: Cardiology | Admitting: Cardiology

## 2012-12-14 DIAGNOSIS — Z5189 Encounter for other specified aftercare: Secondary | ICD-10-CM | POA: Insufficient documentation

## 2012-12-14 DIAGNOSIS — Z951 Presence of aortocoronary bypass graft: Secondary | ICD-10-CM | POA: Insufficient documentation

## 2012-12-16 ENCOUNTER — Encounter (HOSPITAL_COMMUNITY)
Admission: RE | Admit: 2012-12-16 | Discharge: 2012-12-16 | Disposition: A | Discharge status: Home / Self Care | Payer: Medicare Other | Source: Ambulatory Visit | Attending: Cardiology | Admitting: Cardiology

## 2012-12-18 ENCOUNTER — Encounter (HOSPITAL_COMMUNITY)
Admission: RE | Admit: 2012-12-18 | Discharge: 2012-12-18 | Disposition: A | Discharge status: Home / Self Care | Payer: Medicare Other | Source: Ambulatory Visit | Attending: Cardiology | Admitting: Cardiology

## 2012-12-21 ENCOUNTER — Encounter (HOSPITAL_COMMUNITY)
Admission: RE | Admit: 2012-12-21 | Discharge: 2012-12-21 | Disposition: A | Discharge status: Home / Self Care | Payer: Medicare Other | Source: Ambulatory Visit | Attending: Cardiology | Admitting: Cardiology

## 2012-12-23 ENCOUNTER — Encounter (HOSPITAL_COMMUNITY)
Admission: RE | Admit: 2012-12-23 | Discharge: 2012-12-23 | Disposition: A | Discharge status: Home / Self Care | Payer: Medicare Other | Source: Ambulatory Visit | Attending: Cardiology | Admitting: Cardiology

## 2012-12-25 ENCOUNTER — Encounter (HOSPITAL_COMMUNITY)
Admission: RE | Admit: 2012-12-25 | Discharge: 2012-12-25 | Disposition: A | Discharge status: Home / Self Care | Payer: Medicare Other | Source: Ambulatory Visit | Attending: Cardiology | Admitting: Cardiology

## 2012-12-28 ENCOUNTER — Encounter (HOSPITAL_COMMUNITY)
Admission: RE | Admit: 2012-12-28 | Discharge: 2012-12-28 | Disposition: A | Discharge status: Home / Self Care | Payer: Medicare Other | Source: Ambulatory Visit | Attending: Cardiology | Admitting: Cardiology

## 2012-12-30 ENCOUNTER — Encounter (HOSPITAL_COMMUNITY)
Admission: RE | Admit: 2012-12-30 | Discharge: 2012-12-30 | Disposition: A | Discharge status: Home / Self Care | Payer: Medicare Other | Source: Ambulatory Visit | Attending: Cardiology | Admitting: Cardiology

## 2012-12-30 NOTE — Addendum Note (Signed)
Encounter addended by: Tawni Millers, RN on: 12/30/2012  1:55 PM<BR>     Documentation filed: Charges VN

## 2013-01-01 ENCOUNTER — Encounter (HOSPITAL_COMMUNITY)
Admission: RE | Admit: 2013-01-01 | Discharge: 2013-01-01 | Disposition: A | Discharge status: Home / Self Care | Payer: Medicare Other | Source: Ambulatory Visit | Attending: Cardiology | Admitting: Cardiology

## 2013-01-04 ENCOUNTER — Encounter (HOSPITAL_COMMUNITY)
Admission: RE | Admit: 2013-01-04 | Discharge: 2013-01-04 | Disposition: A | Discharge status: Home / Self Care | Payer: Medicare Other | Source: Ambulatory Visit | Attending: Cardiology | Admitting: Cardiology

## 2013-01-06 ENCOUNTER — Encounter (HOSPITAL_COMMUNITY)
Admission: RE | Admit: 2013-01-06 | Discharge: 2013-01-06 | Disposition: A | Discharge status: Home / Self Care | Payer: Medicare Other | Source: Ambulatory Visit | Attending: Cardiology | Admitting: Cardiology

## 2013-01-08 ENCOUNTER — Encounter (HOSPITAL_COMMUNITY): Payer: Medicare Other

## 2013-01-11 ENCOUNTER — Encounter (HOSPITAL_COMMUNITY)
Admission: RE | Admit: 2013-01-11 | Discharge: 2013-01-11 | Disposition: A | Discharge status: Home / Self Care | Payer: Medicare Other | Source: Ambulatory Visit | Attending: Cardiology | Admitting: Cardiology

## 2013-01-13 ENCOUNTER — Encounter (HOSPITAL_COMMUNITY)
Admission: RE | Admit: 2013-01-13 | Discharge: 2013-01-13 | Disposition: A | Discharge status: Home / Self Care | Payer: Medicare Other | Source: Ambulatory Visit | Attending: Cardiology | Admitting: Cardiology

## 2013-01-15 ENCOUNTER — Encounter (HOSPITAL_COMMUNITY)
Admission: RE | Admit: 2013-01-15 | Discharge: 2013-01-15 | Disposition: A | Discharge status: Home / Self Care | Payer: Medicare Other | Source: Ambulatory Visit | Attending: Cardiology | Admitting: Cardiology

## 2013-01-15 DIAGNOSIS — Z5189 Encounter for other specified aftercare: Secondary | ICD-10-CM | POA: Insufficient documentation

## 2013-01-15 DIAGNOSIS — Z951 Presence of aortocoronary bypass graft: Secondary | ICD-10-CM | POA: Insufficient documentation

## 2013-01-18 ENCOUNTER — Encounter (HOSPITAL_COMMUNITY)
Admission: RE | Admit: 2013-01-18 | Discharge: 2013-01-18 | Disposition: A | Discharge status: Home / Self Care | Payer: Medicare Other | Source: Ambulatory Visit | Attending: Cardiology | Admitting: Cardiology

## 2013-01-20 ENCOUNTER — Encounter (HOSPITAL_COMMUNITY)
Admission: RE | Admit: 2013-01-20 | Discharge: 2013-01-20 | Disposition: A | Discharge status: Home / Self Care | Payer: Medicare Other | Source: Ambulatory Visit | Attending: Cardiology | Admitting: Cardiology

## 2013-01-22 ENCOUNTER — Encounter (HOSPITAL_COMMUNITY)
Admission: RE | Admit: 2013-01-22 | Discharge: 2013-01-22 | Disposition: A | Discharge status: Home / Self Care | Payer: Medicare Other | Source: Ambulatory Visit | Attending: Cardiology | Admitting: Cardiology

## 2013-01-25 ENCOUNTER — Encounter (HOSPITAL_COMMUNITY)
Admission: RE | Admit: 2013-01-25 | Discharge: 2013-01-25 | Disposition: A | Discharge status: Home / Self Care | Payer: Medicare Other | Source: Ambulatory Visit | Attending: Cardiology | Admitting: Cardiology

## 2013-01-27 ENCOUNTER — Encounter (HOSPITAL_COMMUNITY)
Admission: RE | Admit: 2013-01-27 | Discharge: 2013-01-27 | Disposition: A | Discharge status: Home / Self Care | Payer: Medicare Other | Source: Ambulatory Visit | Attending: Cardiology | Admitting: Cardiology

## 2013-01-29 ENCOUNTER — Encounter (HOSPITAL_COMMUNITY)
Admission: RE | Admit: 2013-01-29 | Discharge: 2013-01-29 | Disposition: A | Discharge status: Home / Self Care | Payer: Medicare Other | Source: Ambulatory Visit | Attending: Cardiology | Admitting: Cardiology

## 2013-02-02 NOTE — Addendum Note (Signed)
Encounter addended by: Norlene Duel, RN on: 02/02/2013  9:21 AM<BR>     Documentation filed: Clinical Notes

## 2013-02-02 NOTE — Progress Notes (Signed)
Cardiac Rehabilitation Program Outcomes Report   Orientation:  10/29/2012 Graduate Date:  01/29/2013 Discharge Date:  NA # of sessions completed: 46 DX CABG X 3  Cardiologist: Bronson Ing Family MD:  Jannifer Franklin Time:  06:45  A.  Exercise Program:  Tolerates exercise @ 3.85 METS for 15 minutes, Walk Test Results:  Pre: Post Walk Test: Resting HR 70, BP 120/70, O2 99%,RPE 7 and RPD 7, 6 minute HR 93, BP 138/70,O2 92%, RPE 12 and RPD 11, Post HR 74, BP 112/68, O2 100%, RPE 7 and RPD 7. and Discharged to home exercise program.  Anticipated compliance:  excellent  B.  Mental Health:  Good mental attitude  C.  Education/Instruction/Skills  Accurately checks own pulse.  Rest:  70  Exercise:  98, Knows THR for exercise, Uses Perceived Exertion Scale and/or Dyspnea Scale and Attended 13 education classes  Uses Perceived Exertion Scale and/or Dyspnea Scale  D.  Nutrition/Weight Control/Body Composition:  Adherence to prescribed nutrition program: good    E.  Blood Lipids    No results found for this basename: CHOL, HDL, LDLCALC, LDLDIRECT, TRIG, CHOLHDL    F.  Lifestyle Changes:  Making positive lifestyle changes  G.  Symptoms noted with exercise:  Asymptomatic  Report Completed By:  Oletta Lamas. Sterling Mondo RN   Comments: This is patients graduation report. He has done very well while in rehab. He achieved a peak METS of 3.85. His resting HR was 70 and BP was 120/70, his peak HR was 98 and Peak BP was 130/60.  The patient will be called at 1 month,^ months and 1 year post graduation to assure compliance.

## 2013-02-02 NOTE — Progress Notes (Signed)
Cardiac Rehabilitation Program Outcomes Report   Orientation:  10/29/2012 Halfway report: 12/16/2012 Graduate Date:  tbd Discharge Date:  tbd # of sessions completed: 18 HX:TAVW X 3  Cardiologist: Bronson Ing Family MD:  Jannifer Franklin Time:  06:45  A.  Exercise Program:  Tolerates exercise @ 3.85 METS for 15 minutes  B.  Mental Health:  Good mental attitude  C.  Education/Instruction/Skills  Accurately checks own pulse.  Rest:  66  Exercise:  100, Knows THR for exercise and  Uses Perceived Exertion Scale and/or Dyspnea Scale  D.  Nutrition/Weight Control/Body Composition:  Adherence to prescribed nutrition program: good    E.  Blood Lipids    No results found for this basename: CHOL, HDL, LDLCALC, LDLDIRECT, TRIG, CHOLHDL    F.  Lifestyle Changes:  Making positive lifestyle changes  G.  Symptoms noted with exercise:  Asymptomatic  Report Completed By:  George [name]. George Eisner RN   Comments:  This is patients halfway report. He has done well while in rehab. He achieved a peak METS of 3.85. His resting Hr is 68 and resting BP was 132/70, his peak hr was 100 and peak BP 160/80. A report will follow upon his 54 th visit.

## 2013-02-02 NOTE — Addendum Note (Signed)
Encounter addended by: Norlene Duel, RN on: 02/02/2013  9:02 AM<BR>     Documentation filed: Notes Section

## 2013-02-02 NOTE — Addendum Note (Signed)
Encounter addended by: Norlene Duel, RN on: 02/02/2013  9:20 AM<BR>     Documentation filed: Notes Section

## 2013-02-02 NOTE — Progress Notes (Signed)
Cardiac Rehabilitation Program Outcomes Report   Orientation:  10/29/2012 1st week report: 11/06/2012 Graduate Date:  td Discharge Date:  tbd # of sessions completed: 3 DX: CABG X 3    Cardiologist: Bronson Ing Family MD:   Jannifer Franklin Time:  06:45  A.  Exercise Program:  Tolerates exercise @ 3.85 METS for 15 minutes and Walk Test Results:  Pre: Pre walk test: Resting HR 73, BP120/70, O2 100%, RPE 6 and RPD 6, 57min HR 88, BP 150/80,  O2 100%, RPE 11  And  RPD 11, Post HR 76, BP 124/70,  O2 100% RPE6 and RPD 6 Walked 1400 ft at 2.65 mph at 3.0 METS>  B.  Mental Health:  Good mental attitude  C.  Education/Instruction/Skills  Accurately checks own pulse.  Rest:  57  Exercise: 97, Knows THR for exercise and Uses Perceived Exertion Scale and/or Dyspnea Scale  Uses Perceived Exertion Scale and/or Dyspnea Scale  D.  Nutrition/Weight Control/Body Composition:  Adherence to prescribed nutrition program: good    E.  Blood Lipids    No results found for this basename: CHOL, HDL, LDLCALC, LDLDIRECT, TRIG, CHOLHDL    F.  Lifestyle Changes:  Making positive lifestyle changes  G.  Symptoms noted with exercise:  Asymptomatic  Report Completed By:  Oletta Lamas. Michella Detjen RN   Comments:  This is patients 1st week report. He has done well his first week. He achieved a peak METS of 3.85. His resting HR 57 and BP 140/70 and his peak HR was 97 and peak BP 138/80. A report will follow upon his 18th visit his halfway report.

## 2013-02-02 NOTE — Addendum Note (Signed)
Encounter addended by: Norlene Duel, RN on: 02/02/2013  9:03 AM<BR>     Documentation filed: Clinical Notes

## 2013-10-14 ENCOUNTER — Ambulatory Visit (INDEPENDENT_AMBULATORY_CARE_PROVIDER_SITE_OTHER): Payer: Medicare Other | Admitting: Cardiovascular Disease

## 2013-10-14 ENCOUNTER — Encounter: Payer: Self-pay | Admitting: Cardiovascular Disease

## 2013-10-14 ENCOUNTER — Encounter: Payer: Self-pay | Admitting: *Deleted

## 2013-10-14 VITALS — BP 162/85 | HR 67 | Ht 76.0 in | Wt 219.0 lb

## 2013-10-14 DIAGNOSIS — E119 Type 2 diabetes mellitus without complications: Secondary | ICD-10-CM

## 2013-10-14 DIAGNOSIS — E785 Hyperlipidemia, unspecified: Secondary | ICD-10-CM

## 2013-10-14 DIAGNOSIS — I25810 Atherosclerosis of coronary artery bypass graft(s) without angina pectoris: Secondary | ICD-10-CM

## 2013-10-14 DIAGNOSIS — I10 Essential (primary) hypertension: Secondary | ICD-10-CM

## 2013-10-14 MED ORDER — CLONIDINE HCL 0.2 MG PO TABS: 0.2000 mg | ORAL_TABLET | Freq: Two times a day (BID) | ORAL | Status: DC

## 2013-10-14 MED ORDER — ASPIRIN EC 81 MG PO TBEC: 81.0000 mg | DELAYED_RELEASE_TABLET | Freq: Every day | ORAL | Status: DC

## 2013-10-14 NOTE — Progress Notes (Signed)
Patient ID: George [name], male   DOB: [birth year], 68 y.o.   MRN: [id number]      SUBJECTIVE: The patient presents for followup for coronary artery disease and has a history of CABG in July 2014, as well as hypertension, hyperlipidemia and type 2 diabetes mellitus. The patient denies any symptoms of chest pain, palpitations, shortness of breath, lightheadedness, dizziness, leg swelling, orthopnea, PND, and syncope. 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 He tells me his is required to get a stress test and echocardiogram in order to retain his license.  Review of Systems: As per "subjective", otherwise negative.  No Known Allergies  Current Outpatient Prescriptions  Medication Sig Dispense Refill  . amLODipine (NORVASC) 10 MG tablet Take 1 tablet by mouth daily.      Marland Kitchen aspirin EC 325 MG EC tablet Take 1 tablet (325 mg total) by mouth daily.  30 tablet  0  . atorvastatin (LIPITOR) 20 MG tablet Take 20 mg by mouth daily.      . cloNIDine (CATAPRES) 0.1 MG tablet Take 0.1 mg by mouth 2 (two) times daily.      . fexofenadine (ALLEGRA) 180 MG tablet Take 180 mg by mouth at bedtime.      . fluticasone (FLONASE) 50 MCG/ACT nasal spray Place 2 sprays into the nose daily as needed for rhinitis or allergies.       Marland Kitchen glimepiride (AMARYL) 4 MG tablet Take 4 mg by mouth 2 (two) times daily.      . insulin glargine (LANTUS) 100 UNIT/ML injection Inject 0.05-0.45 mLs (5-45 Units total) into the skin 2 (two) times daily. 30 units in the morning and 5-15 units at bedtime per sliding scale. Gradually increase morning dose (as glucose increases) to pre surgery dose of 45 units.  10 mL  12  . losartan (COZAAR) 100 MG tablet Take 1 tablet by mouth daily.      . metFORMIN (GLUCOPHAGE) 500 MG tablet Take 1,000 mg by mouth 2 (two) times daily with a meal.       . metoprolol succinate (TOPROL-XL) 50 MG 24 hr tablet Take 1 tablet (50 mg total) by mouth daily. Take with or immediately following a meal.  30  tablet  1  . omeprazole (PRILOSEC) 20 MG capsule Take 20 mg by mouth daily.      Marland Kitchen UNIFINE PENTIPS 31G X 6 MM MISC        No current facility-administered medications for this visit.    Past Medical History  Diagnosis Date  . Generalized osteoarthrosis, unspecified site   . Pure hypercholesterolemia     takes Atorvastatin daily  . Herpes zoster without mention of complication   . Diabetic neuropathy   . Psoriasis and similar disorder   . Sinusitis     takes Allegra nightly and FLonase prn  . Lumbago   . Chest pain   . Acute upper respiratory infections of unspecified site   . Dermatophytosis of the body   . Pleurisy without mention of effusion or current tuberculosis   . Colitis, enteritis, and gastroenteritis of presumed infectious origin   . Joint pain   . Coronary artery disease   . Other dyspnea and respiratory abnormality     with exertion  . History of bronchitis many yrs ago  . Dizziness   . Confusion     occasionally short term  . Chronic back pain     buldging disc and inflammatory arthritis  . Psoriasis   .  GERD (gastroesophageal reflux disease)     takes Omeprazole daily  . Unspecified essential hypertension     takes Amlodipine,Losartan,CLonidine,Lisinopril,and Metoprolol daily  . Diabetes mellitus without complication     takes Lantus and metformina and amaryl daily    Past Surgical History  Procedure Laterality Date  . Circumcision  2010  . Coronary artery bypass graft N/A 08/13/2012    Procedure: CORONARY ARTERY BYPASS GRAFTING (CABG);  Surgeon: Ivin Poot, MD;  Location: [facility];  Service: Open Heart Surgery;  Laterality: N/A;  . Intraoperative transesophageal echocardiogram N/A 08/13/2012    Procedure: INTRAOPERATIVE TRANSESOPHAGEAL ECHOCARDIOGRAM;  Surgeon: Ivin Poot, MD;  Location: [facility];  Service: Open Heart Surgery;  Laterality: N/A;  . Endovein harvest of greater saphenous vein Right 08/13/2012    Procedure: ENDOVEIN HARVEST OF GREATER  SAPHENOUS VEIN;  Surgeon: Ivin Poot, MD;  Location: [facility];  Service: Open Heart Surgery;  Laterality: Right;    History   Social History  . Marital Status: Married    Spouse Name: N/A    Number of Children: N/A  . Years of Education: N/A   Occupational History  . Not on file.   Social History Main Topics  . Smoking status: Never Smoker   . Smokeless tobacco: Never Used  . Alcohol Use: No  . Drug Use: No  . Sexual Activity: Yes   Other Topics Concern  . Not on file   Social History Narrative  . No narrative on file     Filed Vitals:   10/14/13 1624  BP: 162/85  Pulse: 67  Height: 6\' 4"  (1.93 m)  Weight: 219 lb (99.338 kg)  SpO2: 98%    PHYSICAL EXAM General: NAD HEENT: Normal. Neck: No JVD, no thyromegaly. Lungs: Clear to auscultation bilaterally with normal respiratory effort. CV: Nondisplaced PMI.  Regular rate and rhythm, normal S1/S2, no S3/S4, no murmur. No pretibial or periankle edema.  No carotid bruit.  Normal pedal pulses.  Abdomen: Soft, nontender, no hepatosplenomegaly, no distention.  Neurologic: Alert and oriented x 3.  Psych: Normal affect. Skin: Normal. Musculoskeletal: Normal range of motion, no gross deformities. Extremities: No clubbing or cyanosis.   ECG: Most recent ECG reviewed.      ASSESSMENT AND PLAN: 1. CAD s/p 3-v CABG: Symptomatically stable. Reduce ASA to 81 mg. As part of his license requirements, I will obtain a GXT and echocardiogram. Most recent stress test was prior to CABG in July 2014. I feel he is fit to perform his duties for work. 2. Essential HTN: Elevated today. Will increase clonidine to 0.2 mg bid. He is already on maximum dosages of amlodipine (10 mg) and losartan (100 mg).  3. Hyperlipidemia: Will check to see if lipids were performed by PCP this year. Continue Lipitor 20 mg. 4. Type 2 diabetes: On insulin and metformin.  Dispo: f/u in 6 months.   Kate Sable, M.D., F.A.C.C.

## 2013-10-14 NOTE — Patient Instructions (Signed)
   Decrease Aspirin to 81mg  daily  Increase Clonidine to 0.2mg  twice a day  - new sent to pharm Continue all other medications.   Your physician has requested that you have an exercise tolerance test. For further information please visit [email or web address]. Please also follow instruction sheet, as given. Your physician has requested that you have an echocardiogram. Echocardiography is a painless test that uses sound waves to create images of your heart. It provides your doctor with information about the size and shape of your heart and how well your heart's chambers and valves are working. This procedure takes approximately one hour. There are no restrictions for this procedure. Office will contact with results via phone or letter.   Your physician wants you to follow up in: 6 months.  You will receive a reminder letter in the mail one-two months in advance.  If you don't receive a letter, please call our office to schedule the follow up appointment

## 2013-10-19 ENCOUNTER — Ambulatory Visit (HOSPITAL_COMMUNITY): Admission: RE | Admit: 2013-10-19 | Payer: Medicare Other | Source: Ambulatory Visit

## 2013-10-29 ENCOUNTER — Ambulatory Visit (HOSPITAL_COMMUNITY)
Admission: RE | Admit: 2013-10-29 | Discharge: 2013-10-29 | Disposition: A | Discharge status: Home / Self Care | Payer: Medicare Other | Source: Ambulatory Visit | Attending: Cardiovascular Disease | Admitting: Cardiovascular Disease

## 2013-10-29 ENCOUNTER — Encounter (HOSPITAL_COMMUNITY): Payer: Self-pay

## 2013-10-29 DIAGNOSIS — I10 Essential (primary) hypertension: Secondary | ICD-10-CM | POA: Insufficient documentation

## 2013-10-29 DIAGNOSIS — I25810 Atherosclerosis of coronary artery bypass graft(s) without angina pectoris: Secondary | ICD-10-CM

## 2013-10-29 DIAGNOSIS — Z951 Presence of aortocoronary bypass graft: Secondary | ICD-10-CM | POA: Diagnosis not present

## 2013-10-29 DIAGNOSIS — I2510 Atherosclerotic heart disease of native coronary artery without angina pectoris: Secondary | ICD-10-CM | POA: Diagnosis present

## 2013-10-29 DIAGNOSIS — E119 Type 2 diabetes mellitus without complications: Secondary | ICD-10-CM | POA: Insufficient documentation

## 2013-10-29 DIAGNOSIS — I071 Rheumatic tricuspid insufficiency: Secondary | ICD-10-CM | POA: Insufficient documentation

## 2013-10-29 DIAGNOSIS — E785 Hyperlipidemia, unspecified: Secondary | ICD-10-CM | POA: Diagnosis not present

## 2013-10-29 DIAGNOSIS — I371 Nonrheumatic pulmonary valve insufficiency: Secondary | ICD-10-CM | POA: Diagnosis not present

## 2013-10-29 DIAGNOSIS — R0600 Dyspnea, unspecified: Secondary | ICD-10-CM | POA: Insufficient documentation

## 2013-10-29 DIAGNOSIS — I517 Cardiomegaly: Secondary | ICD-10-CM

## 2013-10-29 NOTE — Progress Notes (Addendum)
Stress Lab Nurses Notes - [name] 10/29/2013 Reason for doing test: CAD and Physical Type of test: Regular GTX Nurse performing test: Gerrit Halls, RN Nuclear Medicine Tech: Not Applicable Echo Tech: Not Applicable MD performing test: Gaynelle Cage MD:  Manuella Ghazi Test explained and consent signed: Yes.   IV started: No IV started Symptoms: fatigue in legs Treatment/Intervention: None Reason test stopped: reached target HR After recovery IV was: NA Patient to return to [facility]. Med at : NA Patient discharged: Home Patient's Condition upon discharge was: stable Comments: During test peak BP 219/113 & HR 151. Recovery BP 174/96 & HR 82. NO medication this AM. Symptoms resolved in recovery.  George [name]  ATTENDING PHYSICIAN ADDENDUM: Pt was markedly hypertensive prior to stress testing, as he had not taken his medications beforehand. Resting ECG demonstrated normal sinus rhythm. With exercise, there were no diagnostic ST-T changes, nor any arrhythmias. No complaints of chest pain. Duke treadmill score of 6 predicts a low risk of future cardiac events (0.9% 1 yr mortality).

## 2013-10-29 NOTE — Progress Notes (Signed)
  Echocardiogram 2D Echocardiogram has been performed.  [place], [place] 10/29/2013, 10:07 AM

## 2013-11-04 ENCOUNTER — Telehealth: Payer: Self-pay | Admitting: *Deleted

## 2013-11-04 DIAGNOSIS — I77810 Thoracic aortic ectasia: Secondary | ICD-10-CM

## 2013-11-04 NOTE — Telephone Encounter (Signed)
Test results & office note faxed to occupational health at wife [facility]) request.  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

## 2013-11-04 NOTE — Telephone Encounter (Signed)
STRESS TEST (GXT) -   ATTENDING PHYSICIAN ADDENDUM:  Pt was markedly hypertensive prior to stress testing, as he had not taken his medications beforehand.  Resting ECG demonstrated normal sinus rhythm. With exercise, there were no diagnostic ST-T changes, nor any arrhythmias. No complaints of chest pain.  Duke treadmill score of 6 predicts a low risk of future cardiac events (0.9% 1 yr mortality).

## 2013-11-04 NOTE — Telephone Encounter (Signed)
Message copied by Laurine Blazer on Thu Nov 04, 2013 11:51 AM ------      Message from: Kate Sable A      Created: Fri Oct 29, 2013 11:14 AM       Would recommend CTA aorta to assess full extent given mild aortic root dilatation. Also recommend tight BP control. Please have him monitor at home if possible. Clonidine increased at last visit. ------

## 2013-11-04 NOTE — Telephone Encounter (Signed)
Notes Recorded by Laurine Blazer, LPN on 22/48/2500 at 11:50 AM Wife notified on 11/02/13.

## 2013-11-08 ENCOUNTER — Other Ambulatory Visit: Payer: Self-pay | Admitting: *Deleted

## 2013-11-08 DIAGNOSIS — I10 Essential (primary) hypertension: Secondary | ICD-10-CM

## 2013-11-08 DIAGNOSIS — Z01810 Encounter for preprocedural cardiovascular examination: Secondary | ICD-10-CM

## 2013-11-10 LAB — BASIC METABOLIC PANEL
BUN: 11 mg/dL (ref 6–23)
CO2: 27 mEq/L (ref 19–32)
Calcium: 9.9 mg/dL (ref 8.4–10.5)
Chloride: 100 mEq/L (ref 96–112)
Creat: 0.74 mg/dL (ref 0.50–1.35)
Glucose, Bld: 242 mg/dL — ABNORMAL HIGH (ref 70–99)
Potassium: 4.2 mEq/L (ref 3.5–5.3)
SODIUM: 137 meq/L (ref 135–145)

## 2013-11-12 ENCOUNTER — Ambulatory Visit (HOSPITAL_COMMUNITY)
Admission: RE | Admit: 2013-11-12 | Discharge: 2013-11-12 | Disposition: A | Discharge status: Home / Self Care | Payer: Medicare Other | Source: Ambulatory Visit | Attending: Cardiovascular Disease | Admitting: Cardiovascular Disease

## 2013-11-12 DIAGNOSIS — J8410 Pulmonary fibrosis, unspecified: Secondary | ICD-10-CM | POA: Insufficient documentation

## 2013-11-12 DIAGNOSIS — K8020 Calculus of gallbladder without cholecystitis without obstruction: Secondary | ICD-10-CM | POA: Diagnosis not present

## 2013-11-12 DIAGNOSIS — Z951 Presence of aortocoronary bypass graft: Secondary | ICD-10-CM | POA: Diagnosis not present

## 2013-11-12 DIAGNOSIS — I77810 Thoracic aortic ectasia: Secondary | ICD-10-CM

## 2013-11-12 DIAGNOSIS — I77819 Aortic ectasia, unspecified site: Secondary | ICD-10-CM | POA: Diagnosis not present

## 2013-11-12 DIAGNOSIS — K760 Fatty (change of) liver, not elsewhere classified: Secondary | ICD-10-CM | POA: Diagnosis not present

## 2013-11-12 MED ORDER — IOHEXOL 350 MG/ML SOLN
100.0000 mL | Freq: Once | INTRAVENOUS | Status: AC | PRN
  Administered 2013-11-12: 100 mL via INTRAVENOUS

## 2013-11-15 ENCOUNTER — Telehealth: Payer: Self-pay | Admitting: Cardiovascular Disease

## 2013-11-15 NOTE — Telephone Encounter (Signed)
Mr. Senft called

## 2013-11-16 NOTE — Telephone Encounter (Signed)
Please call [name] at [phone] requesting test results.

## 2013-11-17 NOTE — Telephone Encounter (Signed)
Patient' wife has called again about wanting test results. They are extremely worried and want to know if all is ok or not.

## 2013-11-18 NOTE — Telephone Encounter (Signed)
Notes Recorded by Laurine Blazer, LPN on 17/04/812 at 48:18 AM Patient notified and verbalized understanding.

## 2013-12-23 ENCOUNTER — Encounter (HOSPITAL_COMMUNITY): Payer: Self-pay | Admitting: Cardiology

## 2014-03-30 ENCOUNTER — Other Ambulatory Visit: Payer: Self-pay

## 2014-03-30 ENCOUNTER — Encounter (HOSPITAL_COMMUNITY)
Admission: RE | Admit: 2014-03-30 | Discharge: 2014-03-30 | Disposition: A | Discharge status: Home / Self Care | Payer: Medicare Other | Source: Ambulatory Visit | Attending: Ophthalmology | Admitting: Ophthalmology

## 2014-03-30 ENCOUNTER — Encounter (HOSPITAL_COMMUNITY): Payer: Self-pay

## 2014-03-30 DIAGNOSIS — H269 Unspecified cataract: Secondary | ICD-10-CM | POA: Insufficient documentation

## 2014-03-30 DIAGNOSIS — I2510 Atherosclerotic heart disease of native coronary artery without angina pectoris: Secondary | ICD-10-CM | POA: Insufficient documentation

## 2014-03-30 DIAGNOSIS — I10 Essential (primary) hypertension: Secondary | ICD-10-CM | POA: Insufficient documentation

## 2014-03-30 DIAGNOSIS — Z01812 Encounter for preprocedural laboratory examination: Secondary | ICD-10-CM | POA: Insufficient documentation

## 2014-03-30 HISTORY — DX: Unspecified hearing loss, unspecified ear: H91.90

## 2014-03-30 LAB — CBC
HEMATOCRIT: 38.8 % — AB (ref 39.0–52.0)
Hemoglobin: 13.2 g/dL (ref 13.0–17.0)
MCH: 27.8 pg (ref 26.0–34.0)
MCHC: 34 g/dL (ref 30.0–36.0)
MCV: 81.9 fL (ref 78.0–100.0)
PLATELETS: 153 10*3/uL (ref 150–400)
RBC: 4.74 MIL/uL (ref 4.22–5.81)
RDW: 13.3 % (ref 11.5–15.5)
WBC: 5.1 10*3/uL (ref 4.0–10.5)

## 2014-03-30 LAB — BASIC METABOLIC PANEL WITH GFR
Anion gap: 7 (ref 5–15)
BUN: 15 mg/dL (ref 6–23)
CO2: 27 mmol/L (ref 19–32)
Calcium: 9.1 mg/dL (ref 8.4–10.5)
Chloride: 102 mmol/L (ref 96–112)
Creatinine, Ser: 0.79 mg/dL (ref 0.50–1.35)
GFR calc Af Amer: >90 mL/min
GFR calc non Af Amer: 90 mL/min — ABNORMAL LOW
Glucose, Bld: 275 mg/dL — ABNORMAL HIGH (ref 70–99)
Potassium: 4.4 mmol/L (ref 3.5–5.1)
Sodium: 136 mmol/L (ref 135–145)

## 2014-03-30 NOTE — Patient Instructions (Signed)
Your procedure is scheduled on: 04/04/2014  Report to [facility] at  1000   AM.  Call this number if you have problems the morning of surgery: [phone]   Do not eat food or drink liquids :After Midnight.      Take these medicines the morning of surgery with A SIP OF WATER: amlodipine, clonidine, allegra, losartan, metoprolol, prilosec. Take 1/2 of your normal Lantus dosage the night before your procedure.   Do not wear jewelry, make-up or nail polish.  Do not wear lotions, powders, or perfumes.  Do not shave 48 hours prior to surgery.  Do not bring valuables to the hospital.  Contacts, dentures or bridgework may not be worn into surgery.  Leave suitcase in the car. After surgery it may be brought to your room.  For patients admitted to the hospital, checkout time is 11:00 AM the day of discharge.   Patients discharged the day of surgery will not be allowed to drive home.  :     Please read over the following fact sheets that you were given: Coughing and Deep Breathing, Surgical Site Infection Prevention, Anesthesia Post-op Instructions and Care and Recovery After Surgery    Cataract A cataract is a clouding of the lens of the eye. When a lens becomes cloudy, vision is reduced based on the degree and nature of the clouding. Many cataracts reduce vision to some degree. Some cataracts make people more near-sighted as they develop. Other cataracts increase glare. Cataracts that are ignored and become worse can sometimes look white. The white color can be seen through the pupil. CAUSES   Aging. However, cataracts may occur at any age, even in newborns.   Certain drugs.   Trauma to the eye.   Certain diseases such as diabetes.   Specific eye diseases such as chronic inflammation inside the eye or a sudden attack of a rare form of glaucoma.   Inherited or acquired medical problems.  SYMPTOMS   Gradual, progressive drop in vision in the affected eye.   Severe, rapid visual loss.  This most often happens when trauma is the cause.  DIAGNOSIS  To detect a cataract, an eye doctor examines the lens. Cataracts are best diagnosed with an exam of the eyes with the pupils enlarged (dilated) by drops.  TREATMENT  For an early cataract, vision may improve by using different eyeglasses or stronger lighting. If that does not help your vision, surgery is the only effective treatment. A cataract needs to be surgically removed when vision loss interferes with your everyday activities, such as driving, reading, or watching TV. A cataract may also have to be removed if it prevents examination or treatment of another eye problem. Surgery removes the cloudy lens and usually replaces it with a substitute lens (intraocular lens, IOL).  At a time when both you and your doctor agree, the cataract will be surgically removed. If you have cataracts in both eyes, only one is usually removed at a time. This allows the operated eye to heal and be out of danger from any possible problems after surgery (such as infection or poor wound healing). In rare cases, a cataract may be doing damage to your eye. In these cases, your caregiver may advise surgical removal right away. The vast majority of people who have cataract surgery have better vision afterward. HOME CARE INSTRUCTIONS  If you are not planning surgery, you may be asked to do the following:  Use different eyeglasses.   Use stronger or  brighter lighting.   Ask your eye doctor about reducing your medicine dose or changing medicines if it is thought that a medicine caused your cataract. Changing medicines does not make the cataract go away on its own.   Become familiar with your surroundings. Poor vision can lead to injury. Avoid bumping into things on the affected side. You are at a higher risk for tripping or falling.   Exercise extreme care when driving or operating machinery.   Wear sunglasses if you are sensitive to bright light or experiencing  problems with glare.  SEEK IMMEDIATE MEDICAL CARE IF:   You have a worsening or sudden vision loss.   You notice redness, swelling, or increasing pain in the eye.   You have a fever.  Document Released: 12/31/2004 Document Revised: 12/20/2010 Document Reviewed: 08/24/2010 [facility] Patient Information 2012 [place].PATIENT INSTRUCTIONS POST-ANESTHESIA  IMMEDIATELY FOLLOWING SURGERY:  Do not drive or operate machinery for the first twenty four hours after surgery.  Do not make any important decisions for twenty four hours after surgery or while taking narcotic pain medications or sedatives.  If you develop intractable nausea and vomiting or a severe headache please notify your doctor immediately.  FOLLOW-UP:  Please make an appointment with your surgeon as instructed. You do not need to follow up with anesthesia unless specifically instructed to do so.  WOUND CARE INSTRUCTIONS (if applicable):  Keep a dry clean dressing on the anesthesia/puncture wound site if there is drainage.  Once the wound has quit draining you may leave it open to air.  Generally you should leave the bandage intact for twenty four hours unless there is drainage.  If the epidural site drains for more than 36-48 hours please call the anesthesia department.  QUESTIONS?:  Please feel free to call your physician or the hospital operator if you have any questions, and they will be happy to assist you.

## 2014-03-30 NOTE — Pre-Procedure Instructions (Signed)
Patient given inforamtion to sign up for my chart at home. 

## 2014-04-01 MED ORDER — LIDOCAINE HCL 3.5 % OP GEL
OPHTHALMIC | Status: AC
  Filled 2014-04-01: qty 1

## 2014-04-01 MED ORDER — TETRACAINE HCL 0.5 % OP SOLN
OPHTHALMIC | Status: AC
  Filled 2014-04-01: qty 2

## 2014-04-01 MED ORDER — CYCLOPENTOLATE-PHENYLEPHRINE OP SOLN OPTIME - NO CHARGE
OPHTHALMIC | Status: AC
  Filled 2014-04-01: qty 2

## 2014-04-04 ENCOUNTER — Encounter (HOSPITAL_COMMUNITY)
Admission: RE | Disposition: A | Discharge status: Home / Self Care | Payer: Self-pay | Source: Ambulatory Visit | Attending: Ophthalmology

## 2014-04-04 ENCOUNTER — Ambulatory Visit (HOSPITAL_COMMUNITY): Payer: Medicare Other | Admitting: Anesthesiology

## 2014-04-04 ENCOUNTER — Ambulatory Visit (HOSPITAL_COMMUNITY)
Admission: RE | Admit: 2014-04-04 | Discharge: 2014-04-04 | Disposition: A | Discharge status: Home / Self Care | Payer: Medicare Other | Source: Ambulatory Visit | Attending: Ophthalmology | Admitting: Ophthalmology

## 2014-04-04 ENCOUNTER — Encounter (HOSPITAL_COMMUNITY): Payer: Self-pay

## 2014-04-04 DIAGNOSIS — H2512 Age-related nuclear cataract, left eye: Secondary | ICD-10-CM | POA: Diagnosis not present

## 2014-04-04 HISTORY — PX: CATARACT EXTRACTION W/PHACO: SHX586

## 2014-04-04 LAB — GLUCOSE, CAPILLARY: Glucose-Capillary: 165 mg/dL — ABNORMAL HIGH (ref 70–99)

## 2014-04-04 SURGERY — PHACOEMULSIFICATION, CATARACT, WITH IOL INSERTION
Anesthesia: Monitor Anesthesia Care | Site: Eye | Laterality: Left

## 2014-04-04 MED ORDER — LIDOCAINE HCL 3.5 % OP GEL
OPHTHALMIC | Status: DC | PRN
  Administered 2014-04-04: 1 via OPHTHALMIC

## 2014-04-04 MED ORDER — FENTANYL CITRATE 0.05 MG/ML IJ SOLN
25.0000 ug | INTRAMUSCULAR | Status: AC
  Administered 2014-04-04 (×2): 25 ug via INTRAVENOUS

## 2014-04-04 MED ORDER — MIDAZOLAM HCL 2 MG/2ML IJ SOLN
1.0000 mg | INTRAMUSCULAR | Status: DC | PRN
  Administered 2014-04-04: 2 mg via INTRAVENOUS

## 2014-04-04 MED ORDER — NA HYALUR & NA CHOND-NA HYALUR 0.55-0.5 ML IO KIT
PACK | INTRAOCULAR | Status: DC | PRN
  Administered 2014-04-04: 1 via OPHTHALMIC

## 2014-04-04 MED ORDER — TETRACAINE HCL 0.5 % OP SOLN
1.0000 [drp] | OPHTHALMIC | Status: AC
  Administered 2014-04-04 (×3): 1 [drp] via OPHTHALMIC

## 2014-04-04 MED ORDER — TETRACAINE 0.5 % OP SOLN OPTIME - NO CHARGE
OPHTHALMIC | Status: DC | PRN
  Administered 2014-04-04: 1 [drp] via OPHTHALMIC

## 2014-04-04 MED ORDER — PHENYLEPHRINE-KETOROLAC 1-0.3 % IO SOLN
Freq: Once | INTRAOCULAR | Status: AC
  Administered 2014-04-04: 500 mL via OPHTHALMIC

## 2014-04-04 MED ORDER — BSS IO SOLN
INTRAOCULAR | Status: DC | PRN
  Administered 2014-04-04: 15 mL via INTRAOCULAR

## 2014-04-04 MED ORDER — MIDAZOLAM HCL 2 MG/2ML IJ SOLN
INTRAMUSCULAR | Status: AC
  Filled 2014-04-04: qty 2

## 2014-04-04 MED ORDER — CYCLOPENTOLATE-PHENYLEPHRINE 0.2-1 % OP SOLN
1.0000 [drp] | OPHTHALMIC | Status: AC
  Administered 2014-04-04 (×3): 1 [drp] via OPHTHALMIC

## 2014-04-04 MED ORDER — MIDAZOLAM HCL 5 MG/5ML IJ SOLN
INTRAMUSCULAR | Status: DC | PRN
  Administered 2014-04-04: 2 mg via INTRAVENOUS

## 2014-04-04 MED ORDER — EPINEPHRINE HCL 1 MG/ML IJ SOLN
INTRAMUSCULAR | Status: AC
  Filled 2014-04-04: qty 1

## 2014-04-04 MED ORDER — FENTANYL CITRATE 0.05 MG/ML IJ SOLN
INTRAMUSCULAR | Status: AC
  Filled 2014-04-04: qty 2

## 2014-04-04 MED ORDER — LACTATED RINGERS IV SOLN
INTRAVENOUS | Status: DC
  Administered 2014-04-04: 11:00:00 via INTRAVENOUS

## 2014-04-04 MED ORDER — POVIDONE-IODINE 5 % OP SOLN
OPHTHALMIC | Status: DC | PRN
  Administered 2014-04-04: 1 via OPHTHALMIC

## 2014-04-04 SURGICAL SUPPLY — 8 items
CLOTH BEACON ORANGE TIMEOUT ST (SAFETY) IMPLANT
GLOVE BIOGEL PI IND STRL 7.0 (GLOVE) IMPLANT
GLOVE BIOGEL PI INDICATOR 7.0 (GLOVE)
GLOVE EXAM NITRILE MD LF STRL (GLOVE) IMPLANT
INST SET CATARACT ~~LOC~~ (KITS) ×3 IMPLANT
LENS ALC ACRYL/TECN (Ophthalmic Related) ×3 IMPLANT
PAD ARMBOARD 7.5X6 YLW CONV (MISCELLANEOUS) IMPLANT
WATER STERILE IRR 250ML POUR (IV SOLUTION) IMPLANT

## 2014-04-04 NOTE — Transfer of Care (Signed)
Immediate Anesthesia Transfer of Care Note  Patient: [name]  Procedure(s) Performed: Procedure(s) with comments: CATARACT EXTRACTION PHACO AND INTRAOCULAR LENS PLACEMENT (IOC) (Left) - CDE:4.49  Patient Location: Short Stay  Anesthesia Type:MAC  Level of Consciousness: awake  Airway & Oxygen Therapy: Patient Spontanous Breathing  Post-op Assessment: Report given to RN  Post vital signs: Reviewed  Last Vitals:  Filed Vitals:   04/04/14 1115  BP: 189/79  Pulse:   Temp:   Resp: 17    Complications: No apparent anesthesia complications

## 2014-04-04 NOTE — Anesthesia Preprocedure Evaluation (Signed)
Anesthesia Evaluation  Patient identified by MRN, date of birth, ID band Patient awake    Reviewed: Allergy & Precautions, H&P , NPO status , Patient's Chart, lab work & pertinent test results  Airway Mallampati: II  TM Distance: >3 FB     Dental  (+) Poor Dentition, Chipped, Missing, Dental Advisory Given   Pulmonary shortness of breath and with exertion, Current Smoker,  breath sounds clear to auscultation        Cardiovascular hypertension, Pt. on medications + CAD Rhythm:Regular Rate:Normal     Neuro/Psych    GI/Hepatic GERD-  Medicated and Controlled,  Endo/Other  diabetes, Type 2, Insulin Dependent  Renal/GU      Musculoskeletal   Abdominal   Peds  Hematology   Anesthesia Other Findings   Reproductive/Obstetrics                             Anesthesia Physical Anesthesia Plan  ASA: III  Anesthesia Plan: MAC   Post-op Pain Management:    Induction: Intravenous  Airway Management Planned: Nasal Cannula  Additional Equipment:   Intra-op Plan:   Post-operative Plan:   Informed Consent: I have reviewed the patients History and Physical, chart, labs and discussed the procedure including the risks, benefits and alternatives for the proposed anesthesia with the patient or authorized representative who has indicated his/her understanding and acceptance.     Plan Discussed with:   Anesthesia Plan Comments:         Anesthesia Quick Evaluation

## 2014-04-04 NOTE — H&P (Signed)
I have reviewed the pre printed H&P, the patient was re-examined, and I have identified no significant interval changes in the patient's medical condition.  There is no change in the plan of care since the history and physical of record. 

## 2014-04-04 NOTE — Brief Op Note (Signed)
04/04/2014  11:53 AM  PATIENT:  [name]  69 y.o. male  PRE-OPERATIVE DIAGNOSIS:  nuclear cataract left eye  POST-OPERATIVE DIAGNOSIS:  nuclear cataract left eye  PROCEDURE:  Procedure(s): CATARACT EXTRACTION PHACO AND INTRAOCULAR LENS PLACEMENT ([facility])  SURGEON:  Surgeon(s): Williams Che, MD  ASSISTANTS: Claudius Sis, CST   ANESTHESIA STAFF: Anesthesiologist: Lerry Liner, MD CRNA: Ollen Bowl, CRNA  ANESTHESIA:   topical and MAC  REQUESTED LENS POWER: 21.5  LENS IMPLANT INFORMATION:  Alcon [id number]  S/n [id number]  Exp 08/2018  CUMULATIVE DISSIPATED ENERGY:   4.49  INDICATIONS:see scanned office H&P  OP FINDINGS:  Moderately dense NS and central [place]  COMPLICATIONS:None  DICTATION #: none  PLAN OF CARE: as above  PATIENT DISPOSITION:  Short Stay

## 2014-04-04 NOTE — Anesthesia Postprocedure Evaluation (Signed)
  Anesthesia Post-op Note  Patient: George [name]  Procedure(s) Performed: Procedure(s) with comments: CATARACT EXTRACTION PHACO AND INTRAOCULAR LENS PLACEMENT (IOC) (Left) - CDE:4.49  Patient Location: Short Stay  Anesthesia Type:MAC  Level of Consciousness: awake, alert  and oriented  Airway and Oxygen Therapy: Patient Spontanous Breathing  Post-op Pain: none  Post-op Assessment: Post-op Vital signs reviewed, Patient's Cardiovascular Status Stable, Respiratory Function Stable, Patent Airway and No signs of Nausea or vomiting  Post-op Vital Signs: Reviewed and stable  Last Vitals:  Filed Vitals:   04/04/14 1115  BP: 189/79  Pulse:   Temp:   Resp: 17    Complications: No apparent anesthesia complications

## 2014-04-04 NOTE — Op Note (Signed)
04/04/2014  11:53 AM  PATIENT:  George [name]  69 y.o. male  PRE-OPERATIVE DIAGNOSIS:  nuclear cataract left eye  POST-OPERATIVE DIAGNOSIS:  nuclear cataract left eye  PROCEDURE:  Procedure(s): CATARACT EXTRACTION PHACO AND INTRAOCULAR LENS PLACEMENT (IOC)  SURGEON:  Surgeon(s): Williams Che, MD  ASSISTANTS: Claudius Sis, CST   ANESTHESIA STAFF: Anesthesiologist: Lerry Liner, MD CRNA: Ollen Bowl, CRNA  ANESTHESIA:   topical and MAC  REQUESTED LENS POWER: 21.5  LENS IMPLANT INFORMATION:  Alcon [id number]  S/n [id number]  Exp 08/2018  CUMULATIVE DISSIPATED ENERGY:   4.49  INDICATIONS:see scanned office H&P  OP FINDINGS:  Moderately dense NS and central PSC  COMPLICATIONS:None  PROCEDURE:  The patient was brought to the operating room in good condition.  The operative eye was prepped and draped in the usual fashion for intraocular surgery.  Lidocaine gel was dropped onto the eye.  A 2.4 mm 10 O'clock near clear corneal stepped incision and a 12 O'clock stab incision were created.  Viscoat was instilled into the anterior chamber.  The 5 mm anterior capsulorhexis was performed with a bent needle cystotome and Utrata forceps.  The lens was hydrodissected and hydrodelineated with a cannula and balanced salt solution and rotated with a Kuglen hook.  Phacoemulsification was perfomed in the divide and conquer technique.  The remaining cortex was removed with I&A and the capsular surfaces polished as necessary.  Provisc was placed into the capsular bag and the lens inserted with the Alcon inserter.  The viscoelastic was removed with I&A and the lens "rocked" into position.  The wounds were hydrated and te anterior chamber was refilled with balanced salt solution.  The wounds were checked for leakage and rehydrated as necessary.  The lid speculum and drapes were removed and the patient was transported to short stay in good condition.

## 2014-04-04 NOTE — Discharge Instructions (Signed)
DORANCE SPINK 04/04/2014 Dr. Iona Hansen Post operative Instructions for Cataract Patients  These instructions are for George [name] and pertain to the operative eye.  1.  Resume your normal diet and previous oral medicines.  2. Your Follow-up appointment is at Dr. Iona Hansen' office in [place] on 04/05/14 @ 2:45pm  3. You may leave the hospital when your driver is present and your nurse releases you.  4. Begin Pred Forte (prednisolone acetate 1%), Acular LS (ketorolac tromethamine .4%) and Gatifloxacin 0.5% eye drops; 1 drop each 4 times daily to operative eye.  Moxifloxacin 0.5% may be substituted for Gatifloxacin using the same instructions.  74. Page Dr. Iona Hansen via beeper [phone] for significant pain in or around operative eye that is not relieved by Tylenol.  6. If you took Plavix before surgery, restart it at the usual dose on the evening of surgery.  7. Wear dark glasses as necessary for excessive light sensitivity.  8. Do no forcefully rub you your operative eye.  9. Keep your operative eye dry for 1 week. You may gently clean your eyelids with a damp washcloth.  10. You may resume normal occupational activities in one week and resume driving as tolerated after the first post operative visit.  11. It is normal to have blurred vision and a scratchy sensation following surgery.  Dr. Iona Hansen: [phone]

## 2014-04-05 ENCOUNTER — Encounter (HOSPITAL_COMMUNITY): Payer: Self-pay | Admitting: Ophthalmology

## 2014-11-11 ENCOUNTER — Telehealth: Payer: Self-pay | Admitting: Cardiovascular Disease

## 2014-11-11 NOTE — Telephone Encounter (Signed)
Mr. Nurse needs a note written by Dr. 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 Please call patient when completed.

## 2014-11-14 ENCOUNTER — Encounter: Payer: Self-pay | Admitting: *Deleted

## 2014-11-16 ENCOUNTER — Encounter: Payer: Self-pay | Admitting: *Deleted

## 2014-11-16 ENCOUNTER — Ambulatory Visit (INDEPENDENT_AMBULATORY_CARE_PROVIDER_SITE_OTHER): Payer: Medicare Other | Admitting: Cardiovascular Disease

## 2014-11-16 ENCOUNTER — Encounter: Payer: Self-pay | Admitting: Cardiovascular Disease

## 2014-11-16 VITALS — BP 135/81 | HR 64 | Ht 76.0 in | Wt 219.0 lb

## 2014-11-16 DIAGNOSIS — I77810 Thoracic aortic ectasia: Secondary | ICD-10-CM | POA: Diagnosis not present

## 2014-11-16 DIAGNOSIS — E785 Hyperlipidemia, unspecified: Secondary | ICD-10-CM | POA: Diagnosis not present

## 2014-11-16 DIAGNOSIS — Z794 Long term (current) use of insulin: Secondary | ICD-10-CM

## 2014-11-16 DIAGNOSIS — E119 Type 2 diabetes mellitus without complications: Secondary | ICD-10-CM

## 2014-11-16 DIAGNOSIS — I10 Essential (primary) hypertension: Secondary | ICD-10-CM

## 2014-11-16 DIAGNOSIS — I25810 Atherosclerosis of coronary artery bypass graft(s) without angina pectoris: Secondary | ICD-10-CM | POA: Diagnosis not present

## 2014-11-16 MED ORDER — ASPIRIN EC 81 MG PO TBEC: 81.0000 mg | DELAYED_RELEASE_TABLET | Freq: Every day | ORAL | Status: DC

## 2014-11-16 NOTE — Progress Notes (Signed)
Patient ID: George [name], male   DOB: [birth year], 69 y.o.   MRN: [id number]      SUBJECTIVE: The patient presents for followup for coronary artery disease and has a history of CABG in July 2014, as well as hypertension, hyperlipidemia and type 2 diabetes mellitus. The patient denies any symptoms of chest pain, palpitations, shortness of breath, lightheadedness, dizziness, leg swelling, orthopnea, PND, and syncope.  Echocardiogram on 10/29/13 demonstrated normal left ventricular systolic function and regional wall motion, EF 55-60% , moderate LVH , grade indeterminate diastolic dysfunction , mild aortic regurgitation, mild left atrial dilatation, and a mildly dilated aortic root, 44 mm.  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 Annual imaging was recommended.  The patient denies any symptoms of chest pain, palpitations, shortness of breath, lightheadedness, dizziness, leg swelling, orthopnea, PND, and syncope.  He is no longer taking ASA. He needs a letter for the DOT.   Review of Systems: As per "subjective", otherwise negative.  No Known Allergies  Current Outpatient Prescriptions  Medication Sig Dispense Refill  . amLODipine (NORVASC) 5 MG tablet Take 1 tablet by mouth daily.    Marland Kitchen atorvastatin (LIPITOR) 20 MG tablet Take 20 mg by mouth daily.    . cloNIDine (CATAPRES) 0.1 MG tablet Take 1 tablet by mouth daily.    . fluticasone (FLONASE) 50 MCG/ACT nasal spray Place 2 sprays into the nose daily as needed for rhinitis or allergies.     Marland Kitchen insulin glargine (LANTUS) 100 UNIT/ML injection Inject 0.05-0.45 mLs (5-45 Units total) into the skin 2 (two) times daily. 30 units in the morning and 5-15 units at bedtime per sliding scale. Gradually increase morning dose (as glucose increases) to pre  surgery dose of 45 units. (Patient taking differently: Inject 35-45 Units into the skin 2 (two) times daily. 45 units in the morning and up to 35 units at bedtime ( currently been taking 20 units at bedtime)) 10 mL 12  . lisinopril (PRINIVIL,ZESTRIL) 2.5 MG tablet Take 1 tablet by mouth daily.    Marland Kitchen losartan (COZAAR) 100 MG tablet Take 1 tablet by mouth daily.    . metFORMIN (GLUCOPHAGE) 500 MG tablet Take 1,000 mg by mouth 2 (two) times daily with a meal.     . metoprolol succinate (TOPROL-XL) 50 MG 24 hr tablet Take 1 tablet (50 mg total) by mouth daily. Take with or immediately following a meal. 30 tablet 1  . NOVOLOG FLEXPEN 100 UNIT/ML FlexPen Inject 15 Units into the skin daily.    Marland Kitchen omeprazole (PRILOSEC) 20 MG capsule Take 20 mg by mouth daily as needed.      No current facility-administered medications for this visit.    Past Medical History  Diagnosis Date  . Generalized osteoarthrosis, unspecified site   . Pure hypercholesterolemia     takes Atorvastatin daily  . Herpes zoster without mention of complication   . Diabetic neuropathy ([place])   . Psoriasis and similar disorder   . Sinusitis     takes Allegra nightly and FLonase prn  . Lumbago   . Chest pain   . Acute upper respiratory infections of unspecified site   . Dermatophytosis of the body   . Pleurisy without mention of effusion or current tuberculosis   . Colitis, enteritis, and gastroenteritis of presumed infectious origin   .  Joint pain   . Coronary artery disease   . Other dyspnea and respiratory abnormality     with exertion  . History of bronchitis many yrs ago  . Dizziness   . Confusion     occasionally short term  . Chronic back pain     buldging disc and inflammatory arthritis  . Psoriasis   . GERD (gastroesophageal reflux disease)     takes Omeprazole daily  . Unspecified essential hypertension     takes Amlodipine,Losartan,CLonidine,Lisinopril,and Metoprolol daily  . Diabetes mellitus without  complication ([place])     takes Lantus and metformina and amaryl daily  . HOH (hard of hearing)     Past Surgical History  Procedure Laterality Date  . Circumcision  2010  . Coronary artery bypass graft N/A 08/13/2012    Procedure: CORONARY ARTERY BYPASS GRAFTING (CABG);  Surgeon: Ivin Poot, MD;  Location: [place];  Service: Open Heart Surgery;  Laterality: N/A;  . Intraoperative transesophageal echocardiogram N/A 08/13/2012    Procedure: INTRAOPERATIVE TRANSESOPHAGEAL ECHOCARDIOGRAM;  Surgeon: Ivin Poot, MD;  Location: [place];  Service: Open Heart Surgery;  Laterality: N/A;  . Endovein harvest of greater saphenous vein Right 08/13/2012    Procedure: ENDOVEIN HARVEST OF GREATER SAPHENOUS VEIN;  Surgeon: Ivin Poot, MD;  Location: [place];  Service: Open Heart Surgery;  Laterality: Right;  . Left heart catheterization with coronary angiogram N/A 08/07/2012    Procedure: LEFT HEART CATHETERIZATION WITH CORONARY ANGIOGRAM;  Surgeon: Peter M Martinique, MD;  Location: [facility] CATH LAB;  Service: Cardiovascular;  Laterality: N/A;  . Cataract extraction w/phaco Left 04/04/2014    Procedure: CATARACT EXTRACTION PHACO AND INTRAOCULAR LENS PLACEMENT (IOC);  Surgeon: Williams Che, MD;  Location: [facility];  Service: Ophthalmology;  Laterality: Left;  CDE:4.49    Social History   Social History  . Marital Status: Married    Spouse Name: N/A  . Number of Children: N/A  . Years of Education: N/A   Occupational History  . Not on file.   Social History Main Topics  . Smoking status: Former Smoker -- 1.00 packs/day for 4 years    Types: Cigarettes    Start date: 03/04/1965    Quit date: 03/05/1967  . Smokeless tobacco: Former Systems developer    Quit date: 03/30/1967  . Alcohol Use: No  . Drug Use: No  . Sexual Activity: Yes    Birth Control/ Protection: None   Other Topics Concern  . Not on file   Social History Narrative     Filed Vitals:   11/16/14 1330  BP: 135/81  Pulse: 64  Height: 6\' 4"   (1.93 m)  Weight: 219 lb (99.338 kg)    PHYSICAL EXAM General: NAD HEENT: Normal. Neck: No JVD, no thyromegaly. Lungs: Clear to auscultation bilaterally with normal respiratory effort. CV: Nondisplaced PMI.  Regular rate and rhythm, normal S1/S2, no S3/S4, no murmur. No pretibial or periankle edema.  No carotid bruit.  Abdomen: Soft, nontender, no distention.  Neurologic: Alert and oriented x 3.  Psych: Normal affect. Skin: Normal. Musculoskeletal: No gross deformities. Extremities: No clubbing or cyanosis.   ECG: Most recent ECG reviewed.      ASSESSMENT AND PLAN: 1. CAD s/p 3-v CABG: Symptomatically stable. No longer taking ASA. Will resume ASA 81 mg daily as it is indicated for secondary prevention. Continue Toprol-XL 50 mg and Lipitor 20 mg. Will provide DOT letter.  2. Essential HTN: Controlled. No changes.  3. Hyperlipidemia: Will check to  see if lipids were performed by PCP this year. Continue Lipitor 20 mg.  4. Type 2 diabetes: On insulin and metformin.  5. Aortic root dilatation: Will repeat CTA in 6 months.   Dispo: f/u in 1 year.   Kate Sable, M.D., F.A.C.C.

## 2014-11-16 NOTE — Patient Instructions (Signed)
Your physician has recommended you make the following change in your medication:  Restart aspirin 81 mg daily. Continue all other medications the same. Non-Cardiac CT Angiography (CTA) of chest, is a special type of CT scan that uses a computer to produce multi-dimensional views of major blood vessels throughout the body. In CT angiography, a contrast material is injected through an IV to help visualize the blood vessels. Your physician recommends that you schedule a follow-up appointment in: 1 year. You will receive a reminder letter in the mail in about 10 months reminding you to call and schedule your appointment. If you don't receive this letter, please contact our office.

## 2014-12-26 NOTE — Patient Instructions (Signed)
George [name]  12/26/2014     @PREFPERIOPPHARMACY @   Your procedure is scheduled on 01/02/15.  Report to [facility] at 10:00 A.M.  Call this number if you have problems the morning of surgery:  [phone]   Remember:  Do not eat food or drink liquids after midnight.  Take these medicines the morning of surgery with A SIP OF WATER Amlodipine, Catapress, Flonase, Lisinopril, Cozaar, Metoprolol, Prilosec  DO NOT TAKE DIABETIC MEDICATIONS MORNING OF PROCEDURE  TAKE ONLY 1/2 DOSE OF EVENING INSULIN NIGHT PRIOR TO PROCEDURE   Do not wear jewelry, make-up or nail polish.  Do not wear lotions, powders, or perfumes.  You may wear deodorant.  Do not shave 48 hours prior to surgery.  Men may shave face and neck.  Do not bring valuables to the hospital.  [facility] is not responsible for any belongings or valuables.  Contacts, dentures or bridgework may not be worn into surgery.  Leave your suitcase in the car.  After surgery it may be brought to your room.  For patients admitted to the hospital, discharge time will be determined by your treatment team.  Patients discharged the day of surgery will not be allowed to drive home.    Please read over the following fact sheets that you were given. Anesthesia Post-op Instructions     PATIENT INSTRUCTIONS POST-ANESTHESIA  IMMEDIATELY FOLLOWING SURGERY:  Do not drive or operate machinery for the first twenty four hours after surgery.  Do not make any important decisions for twenty four hours after surgery or while taking narcotic pain medications or sedatives.  If you develop intractable nausea and vomiting or a severe headache please notify your doctor immediately.  FOLLOW-UP:  Please make an appointment with your surgeon as instructed. You do not need to follow up with anesthesia unless specifically instructed to do so.  WOUND CARE INSTRUCTIONS (if applicable):  Keep a dry clean dressing on the anesthesia/puncture wound site if there  is drainage.  Once the wound has quit draining you may leave it open to air.  Generally you should leave the bandage intact for twenty four hours unless there is drainage.  If the epidural site drains for more than 36-48 hours please call the anesthesia department.  QUESTIONS?:  Please feel free to call your physician or the hospital operator if you have any questions, and they will be happy to assist you.       A cataract is a clouding of the lens of the eye. When a lens becomes cloudy, vision is reduced based on the degree and nature of the clouding. Surgery may be needed to improve vision. Surgery removes the cloudy lens and usually replaces it with a substitute lens (intraocular lens, IOL). LET YOUR EYE DOCTOR KNOW ABOUT:  Allergies to food or medicine.  Medicines taken including herbs, eye drops, over-the-counter medicines, and creams.  Use of steroids (by mouth or creams).  Previous problems with anesthetics or numbing medicine.  History of bleeding problems or blood clots.  Previous surgery.  Other health problems, including diabetes and kidney problems.  Possibility of pregnancy, if this applies. RISKS AND COMPLICATIONS  Infection.  Inflammation of the eyeball (endophthalmitis) that can spread to both eyes (sympathetic ophthalmia).  Poor wound healing.  If an IOL is inserted, it can later fall out of proper position. This is very uncommon.  Clouding of the part of your eye that holds an IOL in place. This is called an "after-cataract." These are  uncommon but easily treated. BEFORE THE PROCEDURE  Do not eat or drink anything except small amounts of water for 8 to 12 before your surgery, or as directed by your caregiver.  Unless you are told otherwise, continue any eye drops you have been prescribed.  Talk to your primary caregiver about all other medicines that you take (both prescription and nonprescription). In some cases, you may need to stop or change medicines  near the time of your surgery. This is most important if you are taking blood-thinning medicine.Do not stop medicines unless you are told to do so.  Arrange for someone to drive you to and from the procedure.  Do not put contact lenses in either eye on the day of your surgery. PROCEDURE There is more than one method for safely removing a cataract. Your doctor can explain the differences and help determine which is best for you. Phacoemulsification surgery is the most common form of cataract surgery.  An injection is given behind the eye or eye drops are given to make this a painless procedure.  A small cut (incision) is made on the edge of the clear, dome-shaped surface that covers the front of the eye (cornea).  A tiny probe is painlessly inserted into the eye. This device gives off ultrasound waves that soften and break up the cloudy center of the lens. This makes it easier for the cloudy lens to be removed by suction.  An IOL may be implanted.  The normal lens of the eye is covered by a clear capsule. Part of that capsule is intentionally left in the eye to support the IOL.  Your surgeon may or may not use stitches to close the incision. There are other forms of cataract surgery that require a larger incision and stitches to close the eye. This approach is taken in cases where the doctor feels that the cataract cannot be easily removed using phacoemulsification. AFTER THE PROCEDURE  When an IOL is implanted, it does not need care. It becomes a permanent part of your eye and cannot be seen or felt.  Your doctor will schedule follow-up exams to check on your progress.  Review your other medicines with your doctor to see which can be resumed after surgery.  Use eye drops or take medicine as prescribed by your doctor.   This information is not intended to replace advice given to you by your health care provider. Make sure you discuss any questions you have with your health care  provider.   Document Released: 12/20/2010 Document Revised: 01/21/2014 Document Reviewed: 12/20/2010 Elsevier Interactive Patient Education [employer].

## 2014-12-27 ENCOUNTER — Encounter (HOSPITAL_COMMUNITY)
Admission: RE | Admit: 2014-12-27 | Discharge: 2014-12-27 | Disposition: A | Discharge status: Home / Self Care | Payer: Medicare Other | Source: Ambulatory Visit | Attending: Ophthalmology | Admitting: Ophthalmology

## 2014-12-27 ENCOUNTER — Encounter (HOSPITAL_COMMUNITY): Payer: Self-pay

## 2014-12-27 DIAGNOSIS — H2511 Age-related nuclear cataract, right eye: Secondary | ICD-10-CM | POA: Diagnosis not present

## 2014-12-27 DIAGNOSIS — Z01818 Encounter for other preprocedural examination: Secondary | ICD-10-CM | POA: Diagnosis present

## 2014-12-27 LAB — CBC
HEMATOCRIT: 40.3 % (ref 39.0–52.0)
HEMOGLOBIN: 14 g/dL (ref 13.0–17.0)
MCH: 28.6 pg (ref 26.0–34.0)
MCHC: 34.7 g/dL (ref 30.0–36.0)
MCV: 82.4 fL (ref 78.0–100.0)
Platelets: 181 10*3/uL (ref 150–400)
RBC: 4.89 MIL/uL (ref 4.22–5.81)
RDW: 13.6 % (ref 11.5–15.5)
WBC: 6 10*3/uL (ref 4.0–10.5)

## 2014-12-27 LAB — BASIC METABOLIC PANEL
Anion gap: 6 (ref 5–15)
BUN: 14 mg/dL (ref 6–20)
CALCIUM: 9.2 mg/dL (ref 8.9–10.3)
CHLORIDE: 100 mmol/L — AB (ref 101–111)
CO2: 29 mmol/L (ref 22–32)
CREATININE: 0.83 mg/dL (ref 0.61–1.24)
GFR calc non Af Amer: >60 mL/min (ref >60)
GLUCOSE: 284 mg/dL — AB (ref 65–99)
Potassium: 4.2 mmol/L (ref 3.5–5.1)
Sodium: 135 mmol/L (ref 135–145)

## 2015-01-02 ENCOUNTER — Ambulatory Visit (HOSPITAL_COMMUNITY)
Admission: RE | Admit: 2015-01-02 | Discharge: 2015-01-02 | Disposition: A | Discharge status: Home / Self Care | Payer: Medicare Other | Source: Ambulatory Visit | Attending: Ophthalmology | Admitting: Ophthalmology

## 2015-01-02 ENCOUNTER — Encounter (HOSPITAL_COMMUNITY)
Admission: RE | Disposition: A | Discharge status: Home / Self Care | Payer: Self-pay | Source: Ambulatory Visit | Attending: Ophthalmology

## 2015-01-02 ENCOUNTER — Ambulatory Visit (HOSPITAL_COMMUNITY): Payer: Medicare Other | Admitting: Anesthesiology

## 2015-01-02 ENCOUNTER — Encounter (HOSPITAL_COMMUNITY): Payer: Self-pay | Admitting: *Deleted

## 2015-01-02 DIAGNOSIS — I10 Essential (primary) hypertension: Secondary | ICD-10-CM | POA: Diagnosis not present

## 2015-01-02 DIAGNOSIS — Z79899 Other long term (current) drug therapy: Secondary | ICD-10-CM | POA: Insufficient documentation

## 2015-01-02 DIAGNOSIS — E11311 Type 2 diabetes mellitus with unspecified diabetic retinopathy with macular edema: Secondary | ICD-10-CM | POA: Insufficient documentation

## 2015-01-02 DIAGNOSIS — H2511 Age-related nuclear cataract, right eye: Secondary | ICD-10-CM | POA: Diagnosis present

## 2015-01-02 DIAGNOSIS — E7800 Pure hypercholesterolemia, unspecified: Secondary | ICD-10-CM | POA: Insufficient documentation

## 2015-01-02 DIAGNOSIS — Z794 Long term (current) use of insulin: Secondary | ICD-10-CM | POA: Insufficient documentation

## 2015-01-02 HISTORY — PX: CATARACT EXTRACTION W/PHACO: SHX586

## 2015-01-02 LAB — GLUCOSE, CAPILLARY: GLUCOSE-CAPILLARY: 187 mg/dL — AB (ref 65–99)

## 2015-01-02 SURGERY — PHACOEMULSIFICATION, CATARACT, WITH IOL INSERTION
Anesthesia: Monitor Anesthesia Care | Site: Eye | Laterality: Right

## 2015-01-02 MED ORDER — PHENYLEPHRINE-KETOROLAC 1-0.3 % IO SOLN
INTRAOCULAR | Status: AC
  Filled 2015-01-02: qty 4

## 2015-01-02 MED ORDER — POVIDONE-IODINE 5 % OP SOLN
OPHTHALMIC | Status: DC | PRN
  Administered 2015-01-02: 1 via OPHTHALMIC

## 2015-01-02 MED ORDER — BSS IO SOLN
INTRAOCULAR | Status: DC | PRN
  Administered 2015-01-02: 15 mL

## 2015-01-02 MED ORDER — NA HYALUR & NA CHOND-NA HYALUR 0.55-0.5 ML IO KIT
PACK | INTRAOCULAR | Status: DC | PRN
  Administered 2015-01-02: 1 via OPHTHALMIC

## 2015-01-02 MED ORDER — LIDOCAINE HCL 3.5 % OP GEL: 1.0000 "application " | Freq: Once | OPHTHALMIC | Status: DC

## 2015-01-02 MED ORDER — MIDAZOLAM HCL 2 MG/2ML IJ SOLN
1.0000 mg | INTRAMUSCULAR | Status: DC | PRN
  Administered 2015-01-02: 2 mg via INTRAVENOUS
  Filled 2015-01-02: qty 2

## 2015-01-02 MED ORDER — FENTANYL CITRATE (PF) 100 MCG/2ML IJ SOLN
25.0000 ug | INTRAMUSCULAR | Status: AC
  Administered 2015-01-02 (×2): 25 ug via INTRAVENOUS
  Filled 2015-01-02: qty 2

## 2015-01-02 MED ORDER — TETRACAINE HCL 0.5 % OP SOLN
1.0000 [drp] | OPHTHALMIC | Status: AC
  Administered 2015-01-02 (×3): 1 [drp] via OPHTHALMIC

## 2015-01-02 MED ORDER — TETRACAINE 0.5 % OP SOLN OPTIME - NO CHARGE
OPHTHALMIC | Status: DC | PRN
  Administered 2015-01-02: 1 [drp] via OPHTHALMIC

## 2015-01-02 MED ORDER — PHENYLEPHRINE-KETOROLAC 1-0.3 % IO SOLN
INTRAOCULAR | Status: DC | PRN
  Administered 2015-01-02: 500 mL via OPHTHALMIC

## 2015-01-02 MED ORDER — LIDOCAINE HCL 3.5 % OP GEL
OPHTHALMIC | Status: DC | PRN
  Administered 2015-01-02: 1 via OPHTHALMIC

## 2015-01-02 MED ORDER — LACTATED RINGERS IV SOLN
INTRAVENOUS | Status: DC
  Administered 2015-01-02: 11:00:00 via INTRAVENOUS

## 2015-01-02 MED ORDER — CYCLOPENTOLATE-PHENYLEPHRINE 0.2-1 % OP SOLN
1.0000 [drp] | OPHTHALMIC | Status: AC
  Administered 2015-01-02 (×3): 1 [drp] via OPHTHALMIC

## 2015-01-02 SURGICAL SUPPLY — 28 items
CAPSULAR TENSION RING-AMO (OPHTHALMIC RELATED) IMPLANT
CLOTH BEACON ORANGE TIMEOUT ST (SAFETY) ×2 IMPLANT
GLOVE BIO SURGEON STRL SZ7.5 (GLOVE) IMPLANT
GLOVE BIOGEL M 6.5 STRL (GLOVE) IMPLANT
GLOVE BIOGEL PI IND STRL 6.5 (GLOVE) IMPLANT
GLOVE BIOGEL PI IND STRL 7.0 (GLOVE) IMPLANT
GLOVE BIOGEL PI INDICATOR 6.5 (GLOVE) ×2
GLOVE BIOGEL PI INDICATOR 7.0 (GLOVE) ×2
GLOVE ECLIPSE 6.5 STRL STRAW (GLOVE) IMPLANT
GLOVE ECLIPSE 7.5 STRL STRAW (GLOVE) IMPLANT
GLOVE EXAM NITRILE LRG STRL (GLOVE) IMPLANT
GLOVE EXAM NITRILE MD LF STRL (GLOVE) IMPLANT
GLOVE SKINSENSE NS SZ6.5 (GLOVE)
GLOVE SKINSENSE NS SZ7.0 (GLOVE)
GLOVE SKINSENSE STRL SZ6.5 (GLOVE) IMPLANT
GLOVE SKINSENSE STRL SZ7.0 (GLOVE) IMPLANT
INST SET CATARACT ~~LOC~~ (KITS) ×3 IMPLANT
KIT VITRECTOMY (OPHTHALMIC RELATED) IMPLANT
LENS ALC ACRYL/TECN (Ophthalmic Related) ×3 IMPLANT
PAD ARMBOARD 7.5X6 YLW CONV (MISCELLANEOUS) ×2 IMPLANT
PROC W NO LENS (INTRAOCULAR LENS)
PROC W SPEC LENS (INTRAOCULAR LENS)
PROCESS W NO LENS (INTRAOCULAR LENS) IMPLANT
PROCESS W SPEC LENS (INTRAOCULAR LENS) IMPLANT
RETRACTOR IRIS SIGHTPATH (OPHTHALMIC RELATED) IMPLANT
RING MALYGIN (MISCELLANEOUS) IMPLANT
VISCOELASTIC ADDITIONAL (OPHTHALMIC RELATED) IMPLANT
WATER STERILE IRR 250ML POUR (IV SOLUTION) ×2 IMPLANT

## 2015-01-02 NOTE — Anesthesia Postprocedure Evaluation (Signed)
  Anesthesia Post-op Note  Patient: George [name]  Procedure(s) Performed: Procedure(s) (LRB): CATARACT EXTRACTION PHACO AND INTRAOCULAR LENS PLACEMENT; CDE:  6.36 (Right)  Patient Location:  Short Stay  Anesthesia Type: MAC  Level of Consciousness: awake  Airway and Oxygen Therapy: Patient Spontanous Breathing  Post-op Pain: none  Post-op Assessment: Post-op Vital signs reviewed, Patient's Cardiovascular Status Stable, Respiratory Function Stable, Patent Airway, No signs of Nausea or vomiting and Pain level controlled  Post-op Vital Signs: Reviewed and stable  Complications: No apparent anesthesia complications

## 2015-01-02 NOTE — Brief Op Note (Signed)
01/02/2015  11:40 AM  PATIENT:  George [name]  69 y.o. male  PRE-OPERATIVE DIAGNOSIS:  nuclear cataract right eye  POST-OPERATIVE DIAGNOSIS:  nuclear cataract right eye  PROCEDURE:  Procedure(s): CATARACT EXTRACTION PHACO AND INTRAOCULAR LENS PLACEMENT; CDE:  6.36  SURGEON:  Surgeon(s): Williams Che, MD  ASSISTANTS: Cleda Clarks, CST   ANESTHESIA STAFF: Anesthesiologist: Lerry Liner, MD CRNA: Vista Deck, CRNA  ANESTHESIA:   topical and MAC  REQUESTED LENS POWER: 20.5  LENS IMPLANT INFORMATION:   Alcon [id number]   S/n [id number]  Exp 03/2019  CUMULATIVE DISSIPATED ENERGY:6.36  INDICATIONS:see scanned office H&P for particulars  OP FINDINGS:dense NS  COMPLICATIONS:None  DICTATION #: none  PLAN OF CARE: as above  PATIENT DISPOSITION:  Short Stay

## 2015-01-02 NOTE — Anesthesia Procedure Notes (Signed)
Procedure Name: MAC Date/Time: 01/02/2015 11:08 AM Performed by: Vista Deck Pre-anesthesia Checklist: Patient identified, Emergency Drugs available, Suction available, Timeout performed and Patient being monitored Patient Re-evaluated:Patient Re-evaluated prior to inductionOxygen Delivery Method: Nasal Cannula

## 2015-01-02 NOTE — Discharge Instructions (Signed)

## 2015-01-02 NOTE — Transfer of Care (Signed)
Immediate Anesthesia Transfer of Care Note  Patient: George [name]  Procedure(s) Performed: Procedure(s) (LRB): CATARACT EXTRACTION PHACO AND INTRAOCULAR LENS PLACEMENT; CDE:  6.36 (Right)  Patient Location: Shortstay  Anesthesia Type: MAC  Level of Consciousness: awake  Airway & Oxygen Therapy: Patient Spontanous Breathing   Post-op Assessment: Report given to PACU RN, Post -op Vital signs reviewed and stable and Patient moving all extremities  Post vital signs: Reviewed and stable  Complications: No apparent anesthesia complications

## 2015-01-02 NOTE — Anesthesia Preprocedure Evaluation (Signed)
Anesthesia Evaluation  Patient identified by MRN, date of birth, ID band Patient awake    Reviewed: Allergy & Precautions, H&P , NPO status , Patient's Chart, lab work & pertinent test results  Airway Mallampati: II  TM Distance: >3 FB     Dental  (+) Poor Dentition, Chipped, Missing, Dental Advisory Given   Pulmonary shortness of breath and with exertion, Current Smoker, former smoker,    breath sounds clear to auscultation       Cardiovascular hypertension, Pt. on medications + CAD   Rhythm:Regular Rate:Normal     Neuro/Psych    GI/Hepatic GERD  Medicated and Controlled,  Endo/Other  diabetes, Type 2, Insulin Dependent  Renal/GU      Musculoskeletal   Abdominal   Peds  Hematology   Anesthesia Other Findings   Reproductive/Obstetrics                             Anesthesia Physical Anesthesia Plan  ASA: III  Anesthesia Plan: MAC   Post-op Pain Management:    Induction: Intravenous  Airway Management Planned: Nasal Cannula  Additional Equipment:   Intra-op Plan:   Post-operative Plan:   Informed Consent: I have reviewed the patients History and Physical, chart, labs and discussed the procedure including the risks, benefits and alternatives for the proposed anesthesia with the patient or authorized representative who has indicated his/her understanding and acceptance.     Plan Discussed with:   Anesthesia Plan Comments:         Anesthesia Quick Evaluation

## 2015-01-02 NOTE — Op Note (Signed)
01/02/2015  11:40 AM  PATIENT:  George [name]  69 y.o. male  PRE-OPERATIVE DIAGNOSIS:  nuclear cataract right eye  POST-OPERATIVE DIAGNOSIS:  nuclear cataract right eye  PROCEDURE:  Procedure(s): CATARACT EXTRACTION PHACO AND INTRAOCULAR LENS PLACEMENT; CDE:  6.36  SURGEON:  Surgeon(s): Williams Che, MD  ASSISTANTS: Cleda Clarks, CST   ANESTHESIA STAFF: Anesthesiologist: Lerry Liner, MD CRNA: Vista Deck, CRNA  ANESTHESIA:   topical and MAC  REQUESTED LENS POWER: 20.5  LENS IMPLANT INFORMATION:   Alcon SN60WF   S/n [id number]  Exp 03/2019  CUMULATIVE DISSIPATED ENERGY:6.36  INDICATIONS:see scanned office H&P for particulars  OP FINDINGS:dense NS  COMPLICATIONS:None  PROCEDURE:  The patient was brought to the operating room in good condition.  The operative eye was prepped and draped in the usual fashion for intraocular surgery.  Lidocaine gel was dropped onto the eye.  A 2.4 mm 10 O'clock near clear corneal stepped incision and a 12 O'clock stab incision were created.  Viscoat was instilled into the anterior chamber.  The 5 mm anterior capsulorhexis was performed with a bent needle cystotome and Utrata forceps.  The lens was hydrodissected and hydrodelineated with a cannula and balanced salt solution and rotated with a Kuglen hook.  Phacoemulsification was perfomed in the divide and conquer technique.  The remaining cortex was removed with I&A and the capsular surfaces polished as necessary.  Provisc was placed into the capsular bag and the lens inserted with the Alcon inserter.  The viscoelastic was removed with I&A and the lens "rocked" into position.  The wounds were hydrated and te anterior chamber was refilled with balanced salt solution.  The wounds were checked for leakage and rehydrated as necessary.  The lid speculum and drapes were removed and the patient was transported to short stay in good condition.  PATIENT DISPOSITION:  Short Stay

## 2015-01-02 NOTE — H&P (Signed)
I have reviewed the pre printed H&P, the patient was re-examined, and I have identified no significant interval changes in the patient's medical condition.  There is no change in the plan of care since the history and physical of record. 

## 2015-01-03 ENCOUNTER — Encounter (HOSPITAL_COMMUNITY): Payer: Self-pay | Admitting: Ophthalmology

## 2015-02-28 DIAGNOSIS — Z23 Encounter for immunization: Secondary | ICD-10-CM | POA: Diagnosis not present

## 2015-03-10 DIAGNOSIS — E1151 Type 2 diabetes mellitus with diabetic peripheral angiopathy without gangrene: Secondary | ICD-10-CM | POA: Diagnosis not present

## 2015-03-10 DIAGNOSIS — Z6829 Body mass index (BMI) 29.0-29.9, adult: Secondary | ICD-10-CM | POA: Diagnosis not present

## 2015-03-10 DIAGNOSIS — Z87891 Personal history of nicotine dependence: Secondary | ICD-10-CM | POA: Diagnosis not present

## 2015-04-11 DIAGNOSIS — M545 Low back pain: Secondary | ICD-10-CM | POA: Diagnosis not present

## 2015-04-11 DIAGNOSIS — I10 Essential (primary) hypertension: Secondary | ICD-10-CM | POA: Diagnosis not present

## 2015-04-11 DIAGNOSIS — J019 Acute sinusitis, unspecified: Secondary | ICD-10-CM | POA: Diagnosis not present

## 2015-04-11 DIAGNOSIS — E7800 Pure hypercholesterolemia, unspecified: Secondary | ICD-10-CM | POA: Diagnosis not present

## 2015-04-11 DIAGNOSIS — E1151 Type 2 diabetes mellitus with diabetic peripheral angiopathy without gangrene: Secondary | ICD-10-CM | POA: Diagnosis not present

## 2015-04-14 DIAGNOSIS — I10 Essential (primary) hypertension: Secondary | ICD-10-CM | POA: Diagnosis not present

## 2015-04-14 DIAGNOSIS — E1151 Type 2 diabetes mellitus with diabetic peripheral angiopathy without gangrene: Secondary | ICD-10-CM | POA: Diagnosis not present

## 2015-04-14 DIAGNOSIS — B354 Tinea corporis: Secondary | ICD-10-CM | POA: Diagnosis not present

## 2015-06-16 DIAGNOSIS — E7800 Pure hypercholesterolemia, unspecified: Secondary | ICD-10-CM | POA: Diagnosis not present

## 2015-06-16 DIAGNOSIS — E1151 Type 2 diabetes mellitus with diabetic peripheral angiopathy without gangrene: Secondary | ICD-10-CM | POA: Diagnosis not present

## 2015-06-16 DIAGNOSIS — I10 Essential (primary) hypertension: Secondary | ICD-10-CM | POA: Diagnosis not present

## 2015-07-06 ENCOUNTER — Other Ambulatory Visit: Payer: Self-pay | Admitting: Cardiovascular Disease

## 2015-07-11 DIAGNOSIS — I10 Essential (primary) hypertension: Secondary | ICD-10-CM | POA: Diagnosis not present

## 2015-07-11 DIAGNOSIS — E119 Type 2 diabetes mellitus without complications: Secondary | ICD-10-CM | POA: Diagnosis not present

## 2015-07-31 DIAGNOSIS — Z1389 Encounter for screening for other disorder: Secondary | ICD-10-CM | POA: Diagnosis not present

## 2015-07-31 DIAGNOSIS — Z7189 Other specified counseling: Secondary | ICD-10-CM | POA: Diagnosis not present

## 2015-07-31 DIAGNOSIS — Z299 Encounter for prophylactic measures, unspecified: Secondary | ICD-10-CM | POA: Diagnosis not present

## 2015-07-31 DIAGNOSIS — Z6827 Body mass index (BMI) 27.0-27.9, adult: Secondary | ICD-10-CM | POA: Diagnosis not present

## 2015-07-31 DIAGNOSIS — Z1211 Encounter for screening for malignant neoplasm of colon: Secondary | ICD-10-CM | POA: Diagnosis not present

## 2015-07-31 DIAGNOSIS — Z0000 Encounter for general adult medical examination without abnormal findings: Secondary | ICD-10-CM | POA: Diagnosis not present

## 2015-08-04 DIAGNOSIS — E7800 Pure hypercholesterolemia, unspecified: Secondary | ICD-10-CM | POA: Diagnosis not present

## 2015-08-04 DIAGNOSIS — R5383 Other fatigue: Secondary | ICD-10-CM | POA: Diagnosis not present

## 2015-08-04 DIAGNOSIS — Z125 Encounter for screening for malignant neoplasm of prostate: Secondary | ICD-10-CM | POA: Diagnosis not present

## 2015-08-04 DIAGNOSIS — E1151 Type 2 diabetes mellitus with diabetic peripheral angiopathy without gangrene: Secondary | ICD-10-CM | POA: Diagnosis not present

## 2015-08-07 DIAGNOSIS — E119 Type 2 diabetes mellitus without complications: Secondary | ICD-10-CM | POA: Diagnosis not present

## 2015-08-07 DIAGNOSIS — I10 Essential (primary) hypertension: Secondary | ICD-10-CM | POA: Diagnosis not present

## 2015-09-01 DIAGNOSIS — I10 Essential (primary) hypertension: Secondary | ICD-10-CM | POA: Diagnosis not present

## 2015-09-01 DIAGNOSIS — E119 Type 2 diabetes mellitus without complications: Secondary | ICD-10-CM | POA: Diagnosis not present

## 2015-09-22 DIAGNOSIS — I10 Essential (primary) hypertension: Secondary | ICD-10-CM | POA: Diagnosis not present

## 2015-09-22 DIAGNOSIS — E119 Type 2 diabetes mellitus without complications: Secondary | ICD-10-CM | POA: Diagnosis not present

## 2015-10-31 DIAGNOSIS — Z6828 Body mass index (BMI) 28.0-28.9, adult: Secondary | ICD-10-CM | POA: Diagnosis not present

## 2015-10-31 DIAGNOSIS — Z713 Dietary counseling and surveillance: Secondary | ICD-10-CM | POA: Diagnosis not present

## 2015-10-31 DIAGNOSIS — Z299 Encounter for prophylactic measures, unspecified: Secondary | ICD-10-CM | POA: Diagnosis not present

## 2015-10-31 DIAGNOSIS — E1151 Type 2 diabetes mellitus with diabetic peripheral angiopathy without gangrene: Secondary | ICD-10-CM | POA: Diagnosis not present

## 2015-12-18 ENCOUNTER — Encounter: Payer: Self-pay | Admitting: *Deleted

## 2015-12-18 ENCOUNTER — Encounter: Payer: Self-pay | Admitting: Cardiovascular Disease

## 2015-12-18 ENCOUNTER — Ambulatory Visit (INDEPENDENT_AMBULATORY_CARE_PROVIDER_SITE_OTHER): Payer: Medicare Other | Admitting: Cardiovascular Disease

## 2015-12-18 VITALS — BP 137/79 | HR 75 | Ht 76.0 in | Wt 212.0 lb

## 2015-12-18 DIAGNOSIS — I712 Thoracic aortic aneurysm, without rupture: Secondary | ICD-10-CM

## 2015-12-18 DIAGNOSIS — I10 Essential (primary) hypertension: Secondary | ICD-10-CM | POA: Diagnosis not present

## 2015-12-18 DIAGNOSIS — I7121 Aneurysm of the ascending aorta, without rupture: Secondary | ICD-10-CM

## 2015-12-18 DIAGNOSIS — I2510 Atherosclerotic heart disease of native coronary artery without angina pectoris: Secondary | ICD-10-CM | POA: Diagnosis not present

## 2015-12-18 DIAGNOSIS — E7800 Pure hypercholesterolemia, unspecified: Secondary | ICD-10-CM | POA: Diagnosis not present

## 2015-12-18 NOTE — Progress Notes (Signed)
SUBJECTIVE: The patient presents for followup for coronary artery disease and has a history of CABG in July 2014, as well as hypertension, hyperlipidemia and type 2 diabetes mellitus.  Echocardiogram on 10/29/13 demonstrated normal left ventricular systolic function and regional wall motion, EF 55-60% , moderate LVH , grade indeterminate diastolic dysfunction , mild aortic regurgitation, mild left atrial dilatation, and a mildly dilated aortic root, 44 mm.  The patient denies any symptoms of chest pain, palpitations, shortness of breath, lightheadedness, dizziness, leg swelling, orthopnea, PND, and syncope.  He did not get CTA done last year. 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 He tries to walk 30 minutes 3 days a week.  ECG shows sinus rhythm with PAC's.   Review of Systems: As per "subjective", otherwise negative.  No Known Allergies  Current Outpatient Prescriptions  Medication Sig Dispense Refill  . amLODipine (NORVASC) 5 MG tablet Take 1 tablet by mouth daily.    Marland Kitchen aspirin EC 81 MG tablet Take 1 tablet (81 mg total) by mouth daily. 90 tablet 3  . aspirin-sod bicarb-citric acid (ALKA-SELTZER) 325 MG TBEF tablet Take 325 mg by mouth every 6 (six) hours as needed.    Marland Kitchen atorvastatin (LIPITOR) 20 MG tablet Take 20 mg by mouth daily.    . Canagliflozin-Metformin HCl (INVOKAMET PO) Take 1 tablet by mouth 2 (two) times daily.    . cloNIDine (CATAPRES) 0.1 MG tablet Take 1 tablet by mouth daily.    . fluticasone (FLONASE) 50 MCG/ACT nasal spray Place 2 sprays into the nose daily as needed for rhinitis or allergies.     Marland Kitchen insulin glargine (LANTUS) 100 UNIT/ML injection Inject 0.05-0.45 mLs (5-45 Units total) into the skin 2 (two) times daily. 30 units in the morning and 5-15 units at bedtime per sliding scale. Gradually increase morning dose (as glucose increases) to pre surgery dose of 45 units. (Patient taking differently: Inject 35-45 Units into the skin 2 (two) times daily. 45  units in the morning and up to 35 units at bedtime ( currently been taking 20 units at bedtime)) 10 mL 12  . lisinopril (PRINIVIL,ZESTRIL) 2.5 MG tablet Take 1 tablet by mouth daily.    Marland Kitchen losartan (COZAAR) 100 MG tablet Take 1 tablet by mouth daily.    . metoprolol succinate (TOPROL-XL) 50 MG 24 hr tablet Take 1 tablet (50 mg total) by mouth daily. Take with or immediately following a meal. 30 tablet 1  . nitroGLYCERIN (NITROSTAT) 0.4 MG SL tablet PLACE 1 TABLET UNDER TONGUE EVERY 5 MINUTES UP TO 3 DOSES, IF NO RELIEF CALL 911 -- EMERGENCY REFILL FAXED DR 25 tablet 3  . NOVOLOG FLEXPEN 100 UNIT/ML FlexPen Inject 15 Units into the skin daily.     No current facility-administered medications for this visit.     Past Medical History:  Diagnosis Date  . Acute upper respiratory infections of unspecified site   . Chest pain   . Chronic back pain    buldging disc and inflammatory arthritis  . Colitis, enteritis, and gastroenteritis of presumed infectious origin   . Confusion    occasionally short term  . Coronary artery disease   . Dermatophytosis of the body   . Diabetes mellitus without complication ([place])    takes Lantus and metformina and amaryl daily  . Diabetic neuropathy ([place])   . Dizziness   . Generalized osteoarthrosis, unspecified site   . GERD (gastroesophageal reflux disease)    takes Omeprazole daily  .  Herpes zoster without mention of complication   . History of bronchitis many yrs ago  . HOH (hard of hearing)   . Joint pain   . Lumbago   . Other dyspnea and respiratory abnormality    with exertion  . Pleurisy without mention of effusion or current tuberculosis   . Psoriasis   . Psoriasis and similar disorder   . Pure hypercholesterolemia    takes Atorvastatin daily  . Sinusitis    takes Allegra nightly and FLonase prn  . Unspecified essential hypertension    takes Amlodipine,Losartan,CLonidine,Lisinopril,and Metoprolol daily    Past Surgical History:  Procedure  Laterality Date  . CATARACT EXTRACTION W/PHACO Left 04/04/2014   Procedure: CATARACT EXTRACTION PHACO AND INTRAOCULAR LENS PLACEMENT (IOC);  Surgeon: Williams Che, MD;  Location: [facility];  Service: Ophthalmology;  Laterality: Left;  CDE:4.49  . CATARACT EXTRACTION W/PHACO Right 01/02/2015   Procedure: CATARACT EXTRACTION PHACO AND INTRAOCULAR LENS PLACEMENT; CDE:  6.36;  Surgeon: Williams Che, MD;  Location: [facility];  Service: Ophthalmology;  Laterality: Right;  . CIRCUMCISION  2010  . CORONARY ARTERY BYPASS GRAFT N/A 08/13/2012   Procedure: CORONARY ARTERY BYPASS GRAFTING (CABG);  Surgeon: Ivin Poot, MD;  Location: [place];  Service: Open Heart Surgery;  Laterality: N/A;  . ENDOVEIN HARVEST OF GREATER SAPHENOUS VEIN Right 08/13/2012   Procedure: ENDOVEIN HARVEST OF GREATER SAPHENOUS VEIN;  Surgeon: Ivin Poot, MD;  Location: [place];  Service: Open Heart Surgery;  Laterality: Right;  . INTRAOPERATIVE TRANSESOPHAGEAL ECHOCARDIOGRAM N/A 08/13/2012   Procedure: INTRAOPERATIVE TRANSESOPHAGEAL ECHOCARDIOGRAM;  Surgeon: Ivin Poot, MD;  Location: [place];  Service: Open Heart Surgery;  Laterality: N/A;  . LEFT HEART CATHETERIZATION WITH CORONARY ANGIOGRAM N/A 08/07/2012   Procedure: LEFT HEART CATHETERIZATION WITH CORONARY ANGIOGRAM;  Surgeon: Peter M Martinique, MD;  Location: [facility] C[facility];  Service: Cardiovascular;  Laterality: N/A;    Social History   Social History  . Marital status: Married    Spouse name: N/A  . Number of children: N/A  . Years of education: N/A   Occupational History  . Not on file.   Social History Main Topics  . Smoking status: Former Smoker    Packs/day: 1.00    Years: 4.00    Types: Cigarettes    Start date: 03/04/1965    Quit date: 03/05/1967  . Smokeless tobacco: Former Systems developer    Quit date: 03/30/1967  . Alcohol use No  . Drug use: No  . Sexual activity: Yes    Birth control/ protection: None   Other Topics Concern  . Not on file   Social History  Narrative  . No narrative on file     Vitals:   12/18/15 1440  BP: 137/79  Pulse: 75  SpO2: 97%  Weight: 212 lb (96.2 kg)  Height: 6\' 4"  (1.93 m)    PHYSICAL EXAM General: NAD HEENT: Normal. Neck: No JVD, no thyromegaly. Lungs: Clear to auscultation bilaterally with normal respiratory effort. CV: Nondisplaced PMI.  Regular rate and rhythm, normal S1/S2, no S3/S4, no murmur. No pretibial or periankle edema.  No carotid bruit.   Abdomen: Soft, nontender, no distention.  Neurologic: Alert and oriented.  Psych: Normal affect. Skin: Normal. Musculoskeletal: No gross deformities.    ECG: Most recent ECG reviewed.      ASSESSMENT AND PLAN: 1. CAD s/p 3-v CABG: Symptomatically stable. Continue ASA, Toprol-XL 50 mg and Lipitor 20 mg. 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  2. Essential HTN:  Controlled. No changes.  3. Hyperlipidemia: Will obtain copy of lipids. Continue Lipitor 20 mg.  4. Type 2 diabetes: On insulin and metformin.  5. Aortic root dilatation: Will repeat CTA (did not have this done since last visit).   Dispo: f/u in 1 year.   Kate Sable, M.D., F.A.C.C.

## 2015-12-18 NOTE — Patient Instructions (Addendum)
Medication Instructions:  Continue all current medications.  Labwork: BMET - order given today.   Testing/Procedures:  Your physician has requested that you have an exercise tolerance test. For further information please visit [email or web address]. Please also follow instruction sheet, as given.  CT angiogram   Office will contact with results via phone or letter.    Follow-Up: Your physician wants you to follow up in:  1 year.  You will receive a reminder letter in the mail one-two months in advance.  If you don't receive a letter, please call our office to schedule the follow up appointment   Any Other Special Instructions Will Be Listed Below (If Applicable).  If you need a refill on your cardiac medications before your next appointment, please call your pharmacy.

## 2015-12-19 ENCOUNTER — Telehealth: Payer: Self-pay | Admitting: Cardiovascular Disease

## 2015-12-19 DIAGNOSIS — Z01812 Encounter for preprocedural laboratory examination: Secondary | ICD-10-CM

## 2015-12-19 DIAGNOSIS — I10 Essential (primary) hypertension: Secondary | ICD-10-CM

## 2015-12-19 NOTE — Telephone Encounter (Signed)
Mr. George [name] called stating that he is claustrophobia and he states that he can not do the upcoming testing CT Chest.

## 2015-12-19 NOTE — Telephone Encounter (Signed)
He was able to do it in 10/2013.

## 2015-12-20 ENCOUNTER — Encounter: Payer: Self-pay | Admitting: *Deleted

## 2015-12-20 NOTE — Telephone Encounter (Signed)
Left message to return call 

## 2015-12-21 NOTE — Telephone Encounter (Signed)
Returned call

## 2015-12-25 ENCOUNTER — Ambulatory Visit (HOSPITAL_COMMUNITY)
Admission: RE | Admit: 2015-12-25 | Discharge: 2015-12-25 | Disposition: A | Discharge status: Home / Self Care | Payer: Medicare Other | Source: Ambulatory Visit | Attending: Cardiovascular Disease | Admitting: Cardiovascular Disease

## 2015-12-25 DIAGNOSIS — R9439 Abnormal result of other cardiovascular function study: Secondary | ICD-10-CM | POA: Diagnosis not present

## 2015-12-25 DIAGNOSIS — I2510 Atherosclerotic heart disease of native coronary artery without angina pectoris: Secondary | ICD-10-CM | POA: Insufficient documentation

## 2015-12-25 LAB — EXERCISE TOLERANCE TEST
CHL CUP MPHR: 150 {beats}/min
CSEPEDS: 9 s
CSEPEW: 7.4 METS
CSEPHR: 89 %
CSEPPHR: 134 {beats}/min
Exercise duration (min): 6 min
RPE: 15
Rest HR: 77 {beats}/min

## 2015-12-27 ENCOUNTER — Telehealth: Payer: Self-pay | Admitting: *Deleted

## 2015-12-27 NOTE — Telephone Encounter (Signed)
Notes Recorded by Laurine Blazer, LPN on [id number] at 2:03 PM EST Wife (Carletta) notified. Copy to pmd. ------  Notes Recorded by Laurine Blazer, LPN on [id number] at 12:04 PM EST Voice mail box not set up. ------  Notes Recorded by Herminio Commons, MD on 12/26/2015 at 8:13 AM EST Low risk.

## 2015-12-27 NOTE — Telephone Encounter (Signed)
Could give 0.25 mg Xanax 1 hour beforehand.

## 2015-12-27 NOTE — Telephone Encounter (Signed)
Spoke with wife Programmer, applications).  Stated that she was with him & he did try, but could not do it.  Was only in the machine a few seconds and they had to get him out.

## 2015-12-27 NOTE — Telephone Encounter (Signed)
Left message to return call 

## 2015-12-28 ENCOUNTER — Ambulatory Visit (HOSPITAL_COMMUNITY): Admission: RE | Admit: 2015-12-28 | Payer: Medicare Other | Source: Ambulatory Visit

## 2015-12-28 ENCOUNTER — Telehealth: Payer: Self-pay | Admitting: Cardiovascular Disease

## 2015-12-28 MED ORDER — ALPRAZOLAM 0.25 MG PO TABS: 0.2500 mg | ORAL_TABLET | ORAL | 0 refills | Status: DC

## 2015-12-28 NOTE — Telephone Encounter (Signed)
Wife Programmer, applications) notified.  Stated she will discuss with husband and call back

## 2015-12-28 NOTE — Telephone Encounter (Signed)
Called George [name] to inform him of upcoming test.  Mailbox has not been set up.

## 2015-12-28 NOTE — Telephone Encounter (Signed)
Ailene Rud Smith 17 minutes ago (2:26 PM)   Carletta called returning Gayle's call

## 2015-12-28 NOTE — Telephone Encounter (Signed)
Wife Programmer, applications) calling back - stated he is willing to try test again with the Xanax.  Will send one time script to [facility] now.  She will have him do lab tomorrow at [place] (across from [facility]).  Will make [facility] ([facility]) aware to reschedule CT.

## 2015-12-28 NOTE — Telephone Encounter (Signed)
Carletta called returning Gayle's call

## 2015-12-29 NOTE — Telephone Encounter (Signed)
Left message to return call 

## 2016-01-02 NOTE — Telephone Encounter (Signed)
Information regarding upcoming CTA left on voice mail.  Scheduled for 01/10/2016 at 9:30 am at [facility] - need to arrive at 9:15.  Nothing to eat / drink 4 hours prior to test.

## 2016-01-10 ENCOUNTER — Ambulatory Visit (HOSPITAL_COMMUNITY): Admission: RE | Admit: 2016-01-10 | Payer: Medicare Other | Source: Ambulatory Visit

## 2016-01-31 DIAGNOSIS — E119 Type 2 diabetes mellitus without complications: Secondary | ICD-10-CM | POA: Diagnosis not present

## 2016-01-31 DIAGNOSIS — I10 Essential (primary) hypertension: Secondary | ICD-10-CM | POA: Diagnosis not present

## 2016-03-25 DIAGNOSIS — Z87891 Personal history of nicotine dependence: Secondary | ICD-10-CM | POA: Diagnosis not present

## 2016-03-25 DIAGNOSIS — Z299 Encounter for prophylactic measures, unspecified: Secondary | ICD-10-CM | POA: Diagnosis not present

## 2016-03-25 DIAGNOSIS — Z713 Dietary counseling and surveillance: Secondary | ICD-10-CM | POA: Diagnosis not present

## 2016-03-25 DIAGNOSIS — E1151 Type 2 diabetes mellitus with diabetic peripheral angiopathy without gangrene: Secondary | ICD-10-CM | POA: Diagnosis not present

## 2016-03-25 DIAGNOSIS — I10 Essential (primary) hypertension: Secondary | ICD-10-CM | POA: Diagnosis not present

## 2016-03-25 DIAGNOSIS — Z6828 Body mass index (BMI) 28.0-28.9, adult: Secondary | ICD-10-CM | POA: Diagnosis not present

## 2016-04-12 DIAGNOSIS — I10 Essential (primary) hypertension: Secondary | ICD-10-CM | POA: Diagnosis not present

## 2016-04-12 DIAGNOSIS — E119 Type 2 diabetes mellitus without complications: Secondary | ICD-10-CM | POA: Diagnosis not present

## 2016-11-06 ENCOUNTER — Ambulatory Visit: Payer: Medicare Other | Admitting: Urology

## 2017-01-02 ENCOUNTER — Telehealth: Payer: Self-pay | Admitting: Cardiovascular Disease

## 2017-01-02 ENCOUNTER — Ambulatory Visit (INDEPENDENT_AMBULATORY_CARE_PROVIDER_SITE_OTHER): Payer: Medicare HMO | Admitting: Cardiovascular Disease

## 2017-01-02 ENCOUNTER — Encounter: Payer: Self-pay | Admitting: Cardiovascular Disease

## 2017-01-02 VITALS — BP 138/78 | HR 83 | Ht 75.0 in | Wt 217.0 lb

## 2017-01-02 DIAGNOSIS — I25118 Atherosclerotic heart disease of native coronary artery with other forms of angina pectoris: Secondary | ICD-10-CM

## 2017-01-02 DIAGNOSIS — E119 Type 2 diabetes mellitus without complications: Secondary | ICD-10-CM | POA: Diagnosis not present

## 2017-01-02 DIAGNOSIS — I7121 Aneurysm of the ascending aorta, without rupture: Secondary | ICD-10-CM

## 2017-01-02 DIAGNOSIS — Z01812 Encounter for preprocedural laboratory examination: Secondary | ICD-10-CM | POA: Diagnosis not present

## 2017-01-02 DIAGNOSIS — E7800 Pure hypercholesterolemia, unspecified: Secondary | ICD-10-CM

## 2017-01-02 DIAGNOSIS — I209 Angina pectoris, unspecified: Secondary | ICD-10-CM

## 2017-01-02 DIAGNOSIS — Z794 Long term (current) use of insulin: Secondary | ICD-10-CM | POA: Diagnosis not present

## 2017-01-02 DIAGNOSIS — I712 Thoracic aortic aneurysm, without rupture: Secondary | ICD-10-CM

## 2017-01-02 DIAGNOSIS — I10 Essential (primary) hypertension: Secondary | ICD-10-CM

## 2017-01-02 MED ORDER — ALPRAZOLAM 0.25 MG PO TABS: 0.2500 mg | ORAL_TABLET | ORAL | 0 refills | Status: DC

## 2017-01-02 NOTE — Patient Instructions (Signed)
Medication Instructions:   Xanax 0.25mg  - take one tab as directed one hour prior to CT  Continue all other medications.    Labwork: BMET - order given today.   Testing/Procedures: CT angio of chest with contrast.   Follow-Up:  Office will contact with results via phone or letter.    Your physician wants you to follow up in:  1 year.  You will receive a reminder letter in the mail one-two months in advance.  If you don't receive a letter, please call our office to schedule the follow up appointment   Any Other Special Instructions Will Be Listed Below (If Applicable).  If you need a refill on your cardiac medications before your next appointment, please call your pharmacy.

## 2017-01-02 NOTE — Telephone Encounter (Signed)
CT angio of chest with contrast - thoracic aortic aneurysm Scheduled at [facility] Jan 15, 2017 arrive 9:45am

## 2017-01-02 NOTE — Progress Notes (Signed)
SUBJECTIVE: The patient presents for followup for coronary artery disease and has a history of CABG in July 2014, as well as hypertension, hyperlipidemia and type 2 diabetes mellitus.  Echocardiogram on 10/29/13 demonstrated normal left ventricular systolic function and regional wall motion, EF 55-60% , moderate LVH , grade indeterminate diastolic dysfunction , mild aortic regurgitation, mild left atrial dilatation, and a mildly dilated aortic root, 44 mm.  He underwent a normal exercise treadmill stress test on 12/25/15.  He did not undergo CTA of the chest due to claustrophobia.  I offered giving him some Xanax before hand.  The patient denies any symptoms of chest pain, palpitations, shortness of breath, lightheadedness, dizziness, leg swelling, orthopnea, PND, and syncope.  He does not remember why he did not get the CTA done.  He was not even aware of me prescribing Xanax.  ECG performed today demonstrates sinus rhythm with first-degree AV block, old septal infarct, and PACs.     Review of Systems: As per "subjective", otherwise negative.  No Known Allergies  Current Outpatient Medications  Medication Sig Dispense Refill  . ALPRAZolam (XANAX) 0.25 MG tablet Take 1 tablet (0.25 mg total) by mouth as directed. - take one tab by mouth one hour prior to CT angio. 1 tablet 0  . amLODipine (NORVASC) 5 MG tablet Take 1 tablet by mouth daily.    Marland Kitchen aspirin EC 81 MG tablet Take 1 tablet (81 mg total) by mouth daily. 90 tablet 3  . aspirin-sod bicarb-citric acid (ALKA-SELTZER) 325 MG TBEF tablet Take 325 mg by mouth every 6 (six) hours as needed.    Marland Kitchen atorvastatin (LIPITOR) 20 MG tablet Take 20 mg by mouth daily.    . cloNIDine (CATAPRES) 0.1 MG tablet Take 1 tablet by mouth daily.    . fluticasone (FLONASE) 50 MCG/ACT nasal spray Place 2 sprays into the nose daily as needed for rhinitis or allergies.     Marland Kitchen insulin glargine (LANTUS) 100 UNIT/ML injection Inject 0.05-0.45 mLs (5-45  Units total) into the skin 2 (two) times daily. 30 units in the morning and 5-15 units at bedtime per sliding scale. Gradually increase morning dose (as glucose increases) to pre surgery dose of 45 units. (Patient taking differently: Inject 35-45 Units into the skin 2 (two) times daily. 45 units in the morning and up to 35 units at bedtime ( currently been taking 20 units at bedtime)) 10 mL 12  . lisinopril (PRINIVIL,ZESTRIL) 2.5 MG tablet Take 1 tablet by mouth daily.    Marland Kitchen losartan (COZAAR) 100 MG tablet Take 1 tablet by mouth daily.    . metoprolol succinate (TOPROL-XL) 50 MG 24 hr tablet Take 1 tablet (50 mg total) by mouth daily. Take with or immediately following a meal. 30 tablet 1  . nitroGLYCERIN (NITROSTAT) 0.4 MG SL tablet PLACE 1 TABLET UNDER TONGUE EVERY 5 MINUTES UP TO 3 DOSES, IF NO RELIEF CALL 911 -- EMERGENCY REFILL FAXED DR 25 tablet 3  . NOVOLOG FLEXPEN 100 UNIT/ML FlexPen Inject 15 Units into the skin daily.     No current facility-administered medications for this visit.     Past Medical History:  Diagnosis Date  . Acute upper respiratory infections of unspecified site   . Chest pain   . Chronic back pain    buldging disc and inflammatory arthritis  . Colitis, enteritis, and gastroenteritis of presumed infectious origin   . Confusion    occasionally short term  . Coronary artery disease   .  Dermatophytosis of the body   . Diabetes mellitus without complication ([place])    takes Lantus and metformina and amaryl daily  . Diabetic neuropathy ([place])   . Dizziness   . Generalized osteoarthrosis, unspecified site   . GERD (gastroesophageal reflux disease)    takes Omeprazole daily  . Herpes zoster without mention of complication   . History of bronchitis many yrs ago  . HOH (hard of hearing)   . Joint pain   . Lumbago   . Other dyspnea and respiratory abnormality    with exertion  . Pleurisy without mention of effusion or current tuberculosis   . Psoriasis   . Psoriasis  and similar disorder   . Pure hypercholesterolemia    takes Atorvastatin daily  . Sinusitis    takes Allegra nightly and FLonase prn  . Unspecified essential hypertension    takes Amlodipine,Losartan,CLonidine,Lisinopril,and Metoprolol daily    Past Surgical History:  Procedure Laterality Date  . CATARACT EXTRACTION W/PHACO Left 04/04/2014   Procedure: CATARACT EXTRACTION PHACO AND INTRAOCULAR LENS PLACEMENT (IOC);  Surgeon: Williams Che, MD;  Location: [facility];  Service: Ophthalmology;  Laterality: Left;  CDE:4.49  . CATARACT EXTRACTION W/PHACO Right 01/02/2015   Procedure: CATARACT EXTRACTION PHACO AND INTRAOCULAR LENS PLACEMENT; CDE:  6.36;  Surgeon: Williams Che, MD;  Location: [facility];  Service: Ophthalmology;  Laterality: Right;  . CIRCUMCISION  2010  . CORONARY ARTERY BYPASS GRAFT N/A 08/13/2012   Procedure: CORONARY ARTERY BYPASS GRAFTING (CABG);  Surgeon: Ivin Poot, MD;  Location: [place];  Service: Open Heart Surgery;  Laterality: N/A;  . ENDOVEIN HARVEST OF GREATER SAPHENOUS VEIN Right 08/13/2012   Procedure: ENDOVEIN HARVEST OF GREATER SAPHENOUS VEIN;  Surgeon: Ivin Poot, MD;  Location: [place];  Service: Open Heart Surgery;  Laterality: Right;  . INTRAOPERATIVE TRANSESOPHAGEAL ECHOCARDIOGRAM N/A 08/13/2012   Procedure: INTRAOPERATIVE TRANSESOPHAGEAL ECHOCARDIOGRAM;  Surgeon: Ivin Poot, MD;  Location: [place];  Service: Open Heart Surgery;  Laterality: N/A;  . LEFT HEART CATHETERIZATION WITH CORONARY ANGIOGRAM N/A 08/07/2012   Procedure: LEFT HEART CATHETERIZATION WITH CORONARY ANGIOGRAM;  Surgeon: Peter M Martinique, MD;  Location: [facility] CATH LAB;  Service: Cardiovascular;  Laterality: N/A;    Social History   Socioeconomic History  . Marital status: Married    Spouse name: Not on file  . Number of children: Not on file  . Years of education: Not on file  . Highest education level: Not on file  Social Needs  . Financial resource strain: Not on file  . Food  insecurity - worry: Not on file  . Food insecurity - inability: Not on file  . Transportation needs - medical: Not on file  . Transportation needs - non-medical: Not on file  Occupational History  . Not on file  Tobacco Use  . Smoking status: Former Smoker    Packs/day: 1.00    Years: 4.00    Pack years: 4.00    Types: Cigarettes    Start date: 03/04/1965    Last attempt to quit: 03/05/1967    Years since quitting: 49.8  . Smokeless tobacco: Former Systems developer    Quit date: 03/30/1967  Substance and Sexual Activity  . Alcohol use: No    Alcohol/week: 0.0 oz  . Drug use: No  . Sexual activity: Yes    Birth control/protection: None  Other Topics Concern  . Not on file  Social History Narrative  . Not on file     Vitals:  01/02/17 1500  BP: 138/78  Pulse: 83  SpO2: 98%  Weight: 217 lb (98.4 kg)  Height: 6\' 3"  (1.905 m)    Wt Readings from Last 3 Encounters:  01/02/17 217 lb (98.4 kg)  12/18/15 212 lb (96.2 kg)  12/27/14 218 lb (98.9 kg)     PHYSICAL EXAM General: NAD HEENT: Normal. Neck: No JVD, no thyromegaly. Lungs: Clear to auscultation bilaterally with normal respiratory effort. CV: Regular rate and rhythm, normal S1/S2, no S3/S4, no murmur. No pretibial or periankle edema.  No carotid bruit.   Abdomen: Soft, nontender, no distention.  Neurologic: Alert and oriented.  Psych: Normal affect. Skin: Normal. Musculoskeletal: No gross deformities.    ECG: Most recent ECG reviewed.   Labs: Lab Results  Component Value Date/Time   K 4.2 12/27/2014 03:42 PM   BUN 14 12/27/2014 03:42 PM   CREATININE 0.83 12/27/2014 03:42 PM   CREATININE 0.74 11/08/2013 10:49 AM   ALT 44 08/11/2012 11:04 AM   HGB 14.0 12/27/2014 03:42 PM     Lipids: No results found for: LDLCALC, LDLDIRECT, CHOL, TRIG, HDL     ASSESSMENT AND PLAN:  1. CAD s/p 3-v CABG: Symptomatically stable. Continue ASA, Toprol-XL 50 mg and Lipitor 20 mg. He underwent a normal exercise treadmill stress  test on 12/25/15.  2. Essential HTN: Mildly elevated today.  No changes to therapy.  3. Hyperlipidemia: Will obtain copy of lipids. Continue Lipitor 20 mg.  4. Type 2 diabetes: On insulin and metformin.  5. Aortic root dilatation: He did not undergo CTA of the chest due to claustrophobia.  I offered giving him some Xanax beforehand.  I will reorder a CTA and prescribe Xanax to be taken 1 hour before hand.     Disposition: Follow up 1 year   Kate Sable, M.D., F.A.C.C.

## 2017-01-03 ENCOUNTER — Ambulatory Visit (INDEPENDENT_AMBULATORY_CARE_PROVIDER_SITE_OTHER): Payer: Medicare HMO | Admitting: Urology

## 2017-01-03 DIAGNOSIS — R972 Elevated prostate specific antigen [PSA]: Secondary | ICD-10-CM | POA: Diagnosis not present

## 2017-01-03 DIAGNOSIS — R351 Nocturia: Secondary | ICD-10-CM | POA: Diagnosis not present

## 2017-01-03 DIAGNOSIS — N5201 Erectile dysfunction due to arterial insufficiency: Secondary | ICD-10-CM

## 2017-01-03 DIAGNOSIS — N403 Nodular prostate with lower urinary tract symptoms: Secondary | ICD-10-CM | POA: Diagnosis not present

## 2017-01-08 ENCOUNTER — Other Ambulatory Visit: Payer: Self-pay | Admitting: Urology

## 2017-01-08 DIAGNOSIS — R972 Elevated prostate specific antigen [PSA]: Secondary | ICD-10-CM

## 2017-01-14 HISTORY — PX: OTHER SURGICAL HISTORY: SHX169

## 2017-01-15 ENCOUNTER — Ambulatory Visit (HOSPITAL_COMMUNITY)
Admission: RE | Admit: 2017-01-15 | Discharge: 2017-01-15 | Disposition: A | Discharge status: Home / Self Care | Payer: Medicare HMO | Source: Ambulatory Visit | Attending: Cardiovascular Disease | Admitting: Cardiovascular Disease

## 2017-01-15 DIAGNOSIS — I2510 Atherosclerotic heart disease of native coronary artery without angina pectoris: Secondary | ICD-10-CM | POA: Insufficient documentation

## 2017-01-15 DIAGNOSIS — Z951 Presence of aortocoronary bypass graft: Secondary | ICD-10-CM | POA: Diagnosis not present

## 2017-01-15 DIAGNOSIS — I712 Thoracic aortic aneurysm, without rupture: Secondary | ICD-10-CM | POA: Diagnosis not present

## 2017-01-15 DIAGNOSIS — I7121 Aneurysm of the ascending aorta, without rupture: Secondary | ICD-10-CM

## 2017-01-15 DIAGNOSIS — I700 Atherosclerosis of aorta: Secondary | ICD-10-CM | POA: Insufficient documentation

## 2017-01-15 LAB — POCT I-STAT CREATININE: Creatinine, Ser: 0.8 mg/dL (ref 0.61–1.24)

## 2017-01-15 LAB — HEMOGLOBIN A1C: Hemoglobin A1C: 9.5

## 2017-01-15 MED ORDER — IOPAMIDOL (ISOVUE-300) INJECTION 61%: 100.0000 mL | Freq: Once | INTRAVENOUS | Status: DC | PRN

## 2017-01-15 MED ORDER — IOPAMIDOL (ISOVUE-370) INJECTION 76%
100.0000 mL | Freq: Once | INTRAVENOUS | Status: AC | PRN
  Administered 2017-01-15: 100 mL via INTRAVENOUS

## 2017-01-21 ENCOUNTER — Telehealth: Payer: Self-pay | Admitting: *Deleted

## 2017-01-21 NOTE — Telephone Encounter (Signed)
Notes recorded by Laurine Blazer, LPN on 03/17/6217 at 4:71 AM EST Patient notified. Copy to pmd. ------  Notes recorded by Herminio Commons, MD on 01/20/2017 at 10:15 AM EST Stable and only mildly dilated. Can be repeated annually.

## 2017-01-24 ENCOUNTER — Encounter (HOSPITAL_COMMUNITY): Payer: Self-pay

## 2017-01-24 ENCOUNTER — Ambulatory Visit (HOSPITAL_COMMUNITY)
Admission: RE | Admit: 2017-01-24 | Discharge: 2017-01-24 | Disposition: A | Discharge status: Home / Self Care | Payer: Medicare HMO | Source: Ambulatory Visit | Attending: Urology | Admitting: Urology

## 2017-01-24 DIAGNOSIS — C61 Malignant neoplasm of prostate: Secondary | ICD-10-CM | POA: Insufficient documentation

## 2017-01-24 DIAGNOSIS — R972 Elevated prostate specific antigen [PSA]: Secondary | ICD-10-CM

## 2017-01-24 MED ORDER — LIDOCAINE HCL (PF) 2 % IJ SOLN
INTRAMUSCULAR | Status: AC
  Administered 2017-01-24: 10 mL
  Filled 2017-01-24: qty 10

## 2017-01-24 MED ORDER — CEFTRIAXONE SODIUM 1 G IJ SOLR
1.0000 g | Freq: Once | INTRAMUSCULAR | Status: AC
  Administered 2017-01-24: 1 g via INTRAMUSCULAR

## 2017-01-24 MED ORDER — LIDOCAINE HCL (PF) 1 % IJ SOLN
INTRAMUSCULAR | Status: AC
  Administered 2017-01-24: 2.1 mL
  Filled 2017-01-24: qty 5

## 2017-01-24 MED ORDER — CEFTRIAXONE SODIUM 1 G IJ SOLR
INTRAMUSCULAR | Status: AC
  Administered 2017-01-24: 1 g via INTRAMUSCULAR
  Filled 2017-01-24: qty 10

## 2017-01-24 NOTE — Discharge Instructions (Signed)
Transrectal Ultrasound-Guided Biopsy °A transrectal ultrasound-guided biopsy is a procedure to remove samples of tissue from your prostate using ultrasound images to guide the procedure. The procedure is usually done to evaluate the prostate gland of men who have an elevated prostate-specific antigen (PSA). PSA is a blood test to screen for prostate cancer. The biopsy samples are taken to check for prostate cancer. °Tell a health care provider about: °· Any allergies you have. °· All medicines you are taking, including vitamins, herbs, eye drops, creams, and over-the-counter medicines. °· Any problems you or family members have had with anesthetic medicines. °· Any blood disorders you have. °· Any surgeries you have had. °· Any medical conditions you have. °What are the risks? °Generally, this is a safe procedure. However, as with any procedure, problems can occur. Possible problems include: °· Infection of your prostate. °· Bleeding from your rectum or blood in your urine. °· Difficulty urinating. °· Nerve damage (this is usually temporary). °· Damage to surrounding structures such as blood vessels, organs, and muscles, which would require other procedures. ° °What happens before the procedure? °· Do not eat or drink anything after midnight on the night before the procedure or as directed by your health care provider. °· Take medicines only as directed by your health care provider. °· Your health care provider may have you stop taking certain medicines 5-7 days before the procedure. °· You will be given an enema before the procedure. During an enema, a liquid is injected into your rectum to clear out waste. °· You may have lab tests the day of your procedure. °· Plan to have someone take you home after the procedure. °What happens during the procedure? °· You will be given medicine to help you relax (sedative) before the procedure. An IV tube will be inserted into one of your veins and used to give fluids and  medicine. °· You will be given antibiotic medicine to reduce the risk of an infection. °· You will be placed on your side for the procedure. °· A probe with lubricated gel will be placed into your rectum, and images will be taken of your prostate and surrounding structures. °· Numbing medicine will be injected into the prostate before the biopsy samples are taken. °· A biopsy needle will then be inserted and guided to your prostate with the use of the ultrasound images. °· Samples of prostate tissue will be taken, and the needle will then be removed. °· The biopsy samples will be sent to a lab to be analyzed. Results are usually back in 2-3 days. °What happens after the procedure? °· You will be taken to a recovery area where you will be monitored. °· You may have some discomfort in the rectal area. You will be given pain medicines to control this. °· You may be allowed to go home the same day, or you may need to stay in the hospital overnight. °This information is not intended to replace advice given to you by your health care provider. Make sure you discuss any questions you have with your health care provider. °Document Released: 05/17/2013 Document Revised: 06/08/2015 Document Reviewed: 08/19/2012 °Elsevier Interactive Patient Education © 2018 [employer] ° °

## 2017-02-04 ENCOUNTER — Other Ambulatory Visit: Payer: Self-pay | Admitting: Urology

## 2017-02-04 DIAGNOSIS — C61 Malignant neoplasm of prostate: Secondary | ICD-10-CM

## 2017-02-04 DIAGNOSIS — R972 Elevated prostate specific antigen [PSA]: Secondary | ICD-10-CM

## 2017-02-10 ENCOUNTER — Encounter (HOSPITAL_COMMUNITY): Payer: Self-pay

## 2017-02-10 ENCOUNTER — Encounter (HOSPITAL_COMMUNITY)
Admission: RE | Admit: 2017-02-10 | Discharge: 2017-02-10 | Disposition: A | Discharge status: Home / Self Care | Payer: Medicare HMO | Source: Ambulatory Visit | Attending: Urology | Admitting: Urology

## 2017-02-10 DIAGNOSIS — C61 Malignant neoplasm of prostate: Secondary | ICD-10-CM | POA: Diagnosis present

## 2017-02-10 DIAGNOSIS — R972 Elevated prostate specific antigen [PSA]: Secondary | ICD-10-CM | POA: Diagnosis not present

## 2017-02-10 HISTORY — DX: Malignant (primary) neoplasm, unspecified: C80.1

## 2017-02-10 MED ORDER — TECHNETIUM TC 99M MEDRONATE IV KIT
20.0000 | PACK | Freq: Once | INTRAVENOUS | Status: AC | PRN
  Administered 2017-02-10: 20 via INTRAVENOUS

## 2017-02-20 ENCOUNTER — Ambulatory Visit (HOSPITAL_COMMUNITY)
Admission: RE | Admit: 2017-02-20 | Discharge: 2017-02-20 | Disposition: A | Discharge status: Home / Self Care | Payer: Medicare HMO | Source: Ambulatory Visit | Attending: Urology | Admitting: Urology

## 2017-02-20 DIAGNOSIS — M5136 Other intervertebral disc degeneration, lumbar region: Secondary | ICD-10-CM | POA: Diagnosis not present

## 2017-02-20 DIAGNOSIS — C61 Malignant neoplasm of prostate: Secondary | ICD-10-CM | POA: Diagnosis not present

## 2017-02-20 DIAGNOSIS — I700 Atherosclerosis of aorta: Secondary | ICD-10-CM | POA: Insufficient documentation

## 2017-02-20 MED ORDER — IOPAMIDOL (ISOVUE-300) INJECTION 61%
100.0000 mL | Freq: Once | INTRAVENOUS | Status: AC | PRN
  Administered 2017-02-20: 100 mL via INTRAVENOUS

## 2017-02-26 ENCOUNTER — Ambulatory Visit: Payer: Medicare HMO | Admitting: "Endocrinology

## 2017-02-26 ENCOUNTER — Encounter: Payer: Self-pay | Admitting: "Endocrinology

## 2017-02-26 VITALS — BP 136/76 | HR 66 | Ht 75.0 in | Wt 212.0 lb

## 2017-02-26 DIAGNOSIS — E782 Mixed hyperlipidemia: Secondary | ICD-10-CM

## 2017-02-26 DIAGNOSIS — E1165 Type 2 diabetes mellitus with hyperglycemia: Secondary | ICD-10-CM

## 2017-02-26 DIAGNOSIS — I10 Essential (primary) hypertension: Secondary | ICD-10-CM

## 2017-02-26 MED ORDER — METFORMIN HCL 500 MG PO TABS: 500.0000 mg | ORAL_TABLET | Freq: Two times a day (BID) | ORAL | 2 refills | Status: DC

## 2017-02-26 NOTE — Patient Instructions (Signed)

## 2017-02-26 NOTE — Progress Notes (Signed)
Consult Note       02/26/2017, 5:53 PM   Subjective:    Patient ID: George [name], male    DOB: 07-29-1945.  George [name] is being seen in consultation for management of currently uncontrolled symptomatic diabetes requested by  Monico Blitz, MD.   Past Medical History:  Diagnosis Date  . Acute upper respiratory infections of unspecified site   . Cancer [facility]    Prostate  . Chest pain   . Chronic back pain    buldging disc and inflammatory arthritis  . Colitis, enteritis, and gastroenteritis of presumed infectious origin   . Confusion    occasionally short term  . Coronary artery disease   . Dermatophytosis of the body   . Diabetes mellitus without complication ([place])    takes Lantus and metformina and amaryl daily  . Diabetic neuropathy ([place])   . Dizziness   . Generalized osteoarthrosis, unspecified site   . GERD (gastroesophageal reflux disease)    takes Omeprazole daily  . Herpes zoster without mention of complication   . History of bronchitis many yrs ago  . HOH (hard of hearing)   . Joint pain   . Lumbago   . Other dyspnea and respiratory abnormality    with exertion  . Pleurisy without mention of effusion or current tuberculosis   . Psoriasis   . Psoriasis and similar disorder   . Pure hypercholesterolemia    takes Atorvastatin daily  . Sinusitis    takes Allegra nightly and FLonase prn  . Unspecified essential hypertension    takes Amlodipine,Losartan,CLonidine,Lisinopril,and Metoprolol daily   Past Surgical History:  Procedure Laterality Date  . CATARACT EXTRACTION W/PHACO Left 04/04/2014   Procedure: CATARACT EXTRACTION PHACO AND INTRAOCULAR LENS PLACEMENT (IOC);  Surgeon: Williams Che, MD;  Location: [facility];  Service: Ophthalmology;  Laterality: Left;  CDE:4.49  . CATARACT EXTRACTION W/PHACO Right 01/02/2015   Procedure: CATARACT EXTRACTION PHACO AND INTRAOCULAR LENS  PLACEMENT; CDE:  6.36;  Surgeon: Williams Che, MD;  Location: [facility];  Service: Ophthalmology;  Laterality: Right;  . CIRCUMCISION  2010  . CORONARY ARTERY BYPASS GRAFT N/A 08/13/2012   Procedure: CORONARY ARTERY BYPASS GRAFTING (CABG);  Surgeon: Ivin Poot, MD;  Location: [place];  Service: Open Heart Surgery;  Laterality: N/A;  . ENDOVEIN HARVEST OF GREATER SAPHENOUS VEIN Right 08/13/2012   Procedure: ENDOVEIN HARVEST OF GREATER SAPHENOUS VEIN;  Surgeon: Ivin Poot, MD;  Location: [place];  Service: Open Heart Surgery;  Laterality: Right;  . INTRAOPERATIVE TRANSESOPHAGEAL ECHOCARDIOGRAM N/A 08/13/2012   Procedure: INTRAOPERATIVE TRANSESOPHAGEAL ECHOCARDIOGRAM;  Surgeon: Ivin Poot, MD;  Location: [place];  Service: Open Heart Surgery;  Laterality: N/A;  . LEFT HEART CATHETERIZATION WITH CORONARY ANGIOGRAM N/A 08/07/2012   Procedure: LEFT HEART CATHETERIZATION WITH CORONARY ANGIOGRAM;  Surgeon: Peter M Martinique, MD;  Location: [facility] CATH LAB;  Service: Cardiovascular;  Laterality: N/A;   Social History   Socioeconomic History  . Marital status: Married    Spouse name: None  . Number of children: None  . Years of education: None  . Highest education level: None  Social  Needs  . Financial resource strain: None  . Food insecurity - worry: None  . Food insecurity - inability: None  . Transportation needs - medical: None  . Transportation needs - non-medical: None  Occupational History  . None  Tobacco Use  . Smoking status: Former Smoker    Packs/day: 1.00    Years: 4.00    Pack years: 4.00    Types: Cigarettes    Start date: 03/04/1965    Last attempt to quit: 03/05/1967    Years since quitting: 50.0  . Smokeless tobacco: Former Systems developer    Quit date: 03/30/1967  Substance and Sexual Activity  . Alcohol use: No    Alcohol/week: 0.0 oz  . Drug use: No  . Sexual activity: Yes    Birth control/protection: None  Other Topics Concern  . None  Social History Narrative  . None    Outpatient Encounter Medications as of 02/26/2017  Medication Sig  . amLODipine (NORVASC) 5 MG tablet Take 1 tablet by mouth daily.  Marland Kitchen atorvastatin (LIPITOR) 20 MG tablet Take 20 mg by mouth daily.  . cloNIDine (CATAPRES) 0.1 MG tablet Take 1 tablet by mouth daily.  . Insulin Glargine (LANTUS SOLOSTAR) 100 UNIT/ML Solostar Pen Inject 50 Units into the skin at bedtime.  Marland Kitchen losartan (COZAAR) 100 MG tablet Take 1 tablet by mouth daily.  . metoprolol succinate (TOPROL-XL) 50 MG 24 hr tablet Take 1 tablet (50 mg total) by mouth daily. Take with or immediately following a meal.  . [DISCONTINUED] empagliflozin (JARDIANCE) 25 MG TABS tablet Take 25 mg by mouth daily.  . [DISCONTINUED] insulin glargine (LANTUS) 100 UNIT/ML injection Inject 0.05-0.45 mLs (5-45 Units total) into the skin 2 (two) times daily. 30 units in the morning and 5-15 units at bedtime per sliding scale. Gradually increase morning dose (as glucose increases) to pre surgery dose of 45 units. (Patient taking differently: Inject 42-46 Units into the skin 2 (two) times daily. )  . metFORMIN (GLUCOPHAGE) 500 MG tablet Take 1 tablet (500 mg total) by mouth 2 (two) times daily with a meal.  . nitroGLYCERIN (NITROSTAT) 0.4 MG SL tablet PLACE 1 TABLET UNDER TONGUE EVERY 5 MINUTES UP TO 3 DOSES, IF NO RELIEF CALL 911 -- EMERGENCY REFILL FAXED DR (Patient not taking: Reported on 02/26/2017)  . [DISCONTINUED] ALPRAZolam (XANAX) 0.25 MG tablet Take 1 tablet (0.25 mg total) by mouth as directed. - take one tab by mouth one hour prior to CT angio.  . [DISCONTINUED] aspirin EC 81 MG tablet Take 1 tablet (81 mg total) by mouth daily.  . [DISCONTINUED] aspirin-sod bicarb-citric acid (ALKA-SELTZER) 325 MG TBEF tablet Take 325 mg by mouth every 6 (six) hours as needed.  . [DISCONTINUED] fluticasone (FLONASE) 50 MCG/ACT nasal spray Place 2 sprays into the nose daily as needed for rhinitis or allergies.   . [DISCONTINUED] lisinopril (PRINIVIL,ZESTRIL) 2.5 MG  tablet Take 1 tablet by mouth daily.  . [DISCONTINUED] NOVOLOG FLEXPEN 100 UNIT/ML FlexPen Inject 15 Units into the skin daily.   No facility-administered encounter medications on file as of 02/26/2017.     ALLERGIES: No Known Allergies  VACCINATION STATUS:  There is no immunization history on file for this patient.  Diabetes  He presents for his initial diabetic visit. He has type 2 diabetes mellitus. Onset time: He was diagnosed at approximate age of 13 years. His disease course has been worsening. There are no hypoglycemic associated symptoms. Pertinent negatives for hypoglycemia include no confusion, headaches, pallor or seizures. Associated  symptoms include blurred vision, polydipsia and polyuria. Pertinent negatives for diabetes include no chest pain, no fatigue, no polyphagia and no weakness. There are no hypoglycemic complications. Symptoms are worsening. Diabetic complications include heart disease. Risk factors for coronary artery disease include dyslipidemia, diabetes mellitus, family history, hypertension, male sex, sedentary lifestyle and tobacco exposure. Current diabetic treatment includes insulin injections and oral agent (monotherapy). His weight is stable. He is following a generally unhealthy diet. When asked about meal planning, he reported none. He has not had a previous visit with a dietitian. He never participates in exercise. (He did not bring any meter or logs to review today.  His recent A1c was 9.5% on January 15, 2017.) An ACE inhibitor/angiotensin II receptor blocker is being taken. He does not see a podiatrist.Eye exam is current.  Hyperlipidemia  This is a chronic problem. The current episode started more than 1 year ago. Exacerbating diseases include diabetes. Pertinent negatives include no chest pain, myalgias or shortness of breath. Current antihyperlipidemic treatment includes statins. Risk factors for coronary artery disease include dyslipidemia, diabetes mellitus,  family history, male sex, hypertension and a sedentary lifestyle.  Hypertension  This is a chronic problem. The current episode started more than 1 year ago. Associated symptoms include blurred vision. Pertinent negatives include no chest pain, headaches, neck pain, palpitations or shortness of breath. Risk factors for coronary artery disease include dyslipidemia, diabetes mellitus, family history, sedentary lifestyle and smoking/tobacco exposure. Hypertensive end-organ damage includes CAD/MI.      Review of Systems  Constitutional: Negative for chills, fatigue, fever and unexpected weight change.  HENT: Positive for hearing loss. Negative for dental problem, mouth sores and trouble swallowing.   Eyes: Positive for blurred vision. Negative for visual disturbance.  Respiratory: Negative for cough, choking, chest tightness, shortness of breath and wheezing.   Cardiovascular: Negative for chest pain, palpitations and leg swelling.  Gastrointestinal: Negative for abdominal distention, abdominal pain, constipation, diarrhea, nausea and vomiting.  Endocrine: Positive for polydipsia and polyuria. Negative for polyphagia.  Genitourinary: Negative for dysuria, flank pain, hematuria and urgency.  Musculoskeletal: Positive for gait problem. Negative for back pain, myalgias and neck pain.  Skin: Negative for pallor, rash and wound.  Neurological: Negative for seizures, syncope, weakness, numbness and headaches.  Psychiatric/Behavioral: Negative.  Negative for confusion and dysphoric mood.    Objective:    BP 136/76   Pulse 66   Ht 6\' 3"  (1.905 m)   Wt 212 lb (96.2 kg)   BMI 26.50 kg/m   Wt Readings from Last 3 Encounters:  02/26/17 212 lb (96.2 kg)  01/02/17 217 lb (98.4 kg)  12/18/15 212 lb (96.2 kg)     Physical Exam  Constitutional: He is oriented to person, place, and time. He appears well-developed. He is cooperative. No distress.  HENT:  Head: Normocephalic and atraumatic.  Eyes:  EOM are normal.  Neck: Normal range of motion. Neck supple. No tracheal deviation present. No thyromegaly present.  Cardiovascular: Normal rate, S1 normal, S2 normal and normal heart sounds. Exam reveals no gallop.  No murmur heard. Pulses:      Dorsalis pedis pulses are 0 on the right side, and 0 on the left side.       Posterior tibial pulses are 0 on the right side, and 0 on the left side.  Pulmonary/Chest: Breath sounds normal. No respiratory distress. He has no wheezes.  Abdominal: Soft. Bowel sounds are normal. He exhibits no distension. There is no tenderness. There is no guarding  and no CVA tenderness.  Musculoskeletal: He exhibits no edema.       Right shoulder: He exhibits no swelling and no deformity.  Neurological: He is alert and oriented to person, place, and time. He has normal strength and normal reflexes. No cranial nerve deficit or sensory deficit. Gait normal.  Hard of hearing  Skin: Skin is warm and dry. No rash noted. No cyanosis. Nails show no clubbing.  Psychiatric: He has a normal mood and affect. His speech is normal. Cognition and memory are normal.    CMP ( most recent) CMP     Component Value Date/Time   NA 135 12/27/2014 1542   K 4.2 12/27/2014 1542   CL 100 (L) 12/27/2014 1542   CO2 29 12/27/2014 1542   GLUCOSE 284 (H) 12/27/2014 1542   BUN 14 12/27/2014 1542   CREATININE 0.80 01/15/2017 1052   CREATININE 0.74 11/08/2013 1049   CALCIUM 9.2 12/27/2014 1542   PROT 6.9 08/11/2012 1104   ALBUMIN 3.8 08/11/2012 1104   AST 28 08/11/2012 1104   ALT 44 08/11/2012 1104   ALKPHOS 100 08/11/2012 1104   BILITOT 0.3 08/11/2012 1104   GFRNONAA >60 12/27/2014 1542   GFRAA >60 12/27/2014 1542     Diabetic Labs (most recent): Lab Results  Component Value Date   HGBA1C 9.5 01/15/2017   HGBA1C 7.8 (H) 08/11/2012     Assessment & Plan:   1. Uncontrolled type 2 diabetes mellitus with hyperglycemia (George [name])   - George [name] has currently uncontrolled  symptomatic type 2 DM since 72 years of age,  with most recent A1c of 9.5 %. Recent labs reviewed.  -his diabetes is complicated by coronary artery disease, peripheral arterial disease and George [name] remains at a high risk for more acute and chronic complications which include CAD, CVA, CKD, retinopathy, and neuropathy. These are all discussed in detail with the patient.  - I have counseled him on diet management and weight loss, by adopting a carbohydrate restricted/protein rich diet.  - Suggestion is made for him to avoid simple carbohydrates  from his diet including Cakes, Sweet Desserts, Ice Cream, Soda (diet and regular), Sweet Tea, Candies, Chips, Cookies, Store Bought Juices, Alcohol in Excess of  1-2 drinks a day, Artificial Sweeteners, and "Sugar-free" Products. This will help patient to have stable blood glucose profile and potentially avoid unintended weight gain.  - I encouraged him to switch to  unprocessed or minimally processed complex starch and increased protein intake (animal or plant source), fruits, and vegetables.  - he is advised to stick to a routine mealtimes to eat 3 meals  a day and avoid unnecessary snacks ( to snack only to correct hypoglycemia).   - he will be scheduled with Jearld Fenton, RDN, CDE for individualized diabetes education.  - I have approached him with the following individualized plan to manage diabetes and patient agrees:   -Given his current glycemic burden, he will likely require basal/bolus insulin in order for him to achieve control of diabetes to target.   -In preparation, I have approached him to start monitoring blood glucose 4 times a day- before meals and at bedtime and return in 1 week with his meter and logs for evaluation.   -Reportedly, he has NovoLog at home he has not been using it.  We will consider that if he continues to have significant postprandial hyperglycemia. -In the meantime, I have adjusted his Lantus to 50 units nightly.     - Patient  is warned not to take insulin without proper monitoring per orders.  -Patient is encouraged to call clinic for blood glucose levels less than 70 or above 300 mg /dl. - I will discontinue Jardiance, risk outweighs benefit for this patient. -He will benefit from metformin treatment.  I discussed and initiated low-dose metformin 500 mg p.o. twice daily.  - he will be considered for incretin therapy as appropriate next visit. - Patient specific target  A1c;  LDL, HDL, Triglycerides, and  Waist Circumference were discussed in detail.  2) BP/HTN: His blood pressure is controlled to target. Continue current medications including ACEI/ARB. 3) Lipids/HPL: No recent lipid panel to review.   Patient is advised to continue statins. 4)  Weight/Diet: CDE Consult will be initiated , exercise, and detailed carbohydrates information provided.  5) Chronic Care/Health Maintenance:  -he  is on ACEI/ARB and Statin medications and  is encouraged to continue to follow up with Ophthalmology, Dentist,  Podiatrist at least yearly or according to recommendations, and advised to  stay away from smoking. I have recommended yearly flu vaccine and pneumonia vaccination at least every 5 years; moderate intensity exercise for up to 150 minutes weekly; and  sleep for at least 7 hours a day.  - I advised patient to maintain close follow up with Monico Blitz, MD for primary care needs.  - Time spent with the patient: 1 hour, of which >50% was spent in obtaining information about his symptoms, reviewing his previous labs, evaluations, and treatments, counseling him about his uncontrolled, complicated type 2 diabetes, hyperlipidemia, hypertension, and developing a plan for long term treatment; his  questions were answered to his satisfaction.    Follow up plan: - Return in about 1 week (around 03/05/2017) for follow up with meter and logs- no labs.  Glade Lloyd, MD [facility] [address] [place], [place] [address] Phone: [phone]  Fax: [phone]    02/26/2017, 5:53 PM  This note was partially dictated with voice recognition software. Similar sounding words can be transcribed inadequately or may not  be corrected upon review.

## 2017-03-05 ENCOUNTER — Encounter: Payer: Self-pay | Admitting: "Endocrinology

## 2017-03-05 ENCOUNTER — Other Ambulatory Visit: Payer: Self-pay

## 2017-03-05 ENCOUNTER — Ambulatory Visit (INDEPENDENT_AMBULATORY_CARE_PROVIDER_SITE_OTHER): Payer: Medicare HMO | Admitting: "Endocrinology

## 2017-03-05 VITALS — BP 136/73 | HR 59 | Ht 75.0 in | Wt 210.0 lb

## 2017-03-05 DIAGNOSIS — E782 Mixed hyperlipidemia: Secondary | ICD-10-CM | POA: Diagnosis not present

## 2017-03-05 DIAGNOSIS — I10 Essential (primary) hypertension: Secondary | ICD-10-CM

## 2017-03-05 DIAGNOSIS — E1165 Type 2 diabetes mellitus with hyperglycemia: Secondary | ICD-10-CM | POA: Diagnosis not present

## 2017-03-05 MED ORDER — INSULIN ASPART 100 UNIT/ML FLEXPEN: 10.0000 [IU] | PEN_INJECTOR | Freq: Three times a day (TID) | SUBCUTANEOUS | 2 refills | Status: DC

## 2017-03-05 NOTE — Patient Instructions (Signed)

## 2017-03-05 NOTE — Progress Notes (Signed)
Consult Note       03/05/2017, 5:15 PM   Subjective:    Patient ID: George [name], male    DOB: May 13, 1945.  George [name] is being seen in consultation for management of currently uncontrolled symptomatic diabetes requested by  Monico Blitz, MD.   Past Medical History:  Diagnosis Date  . Acute upper respiratory infections of unspecified site   . Cancer [facility])    Prostate  . Chest pain   . Chronic back pain    buldging disc and inflammatory arthritis  . Colitis, enteritis, and gastroenteritis of presumed infectious origin   . Confusion    occasionally short term  . Coronary artery disease   . Dermatophytosis of the body   . Diabetes mellitus without complication ([place])    takes Lantus and metformina and amaryl daily  . Diabetic neuropathy ([place])   . Dizziness   . Generalized osteoarthrosis, unspecified site   . GERD (gastroesophageal reflux disease)    takes Omeprazole daily  . Herpes zoster without mention of complication   . History of bronchitis many yrs ago  . HOH (hard of hearing)   . Joint pain   . Lumbago   . Other dyspnea and respiratory abnormality    with exertion  . Pleurisy without mention of effusion or current tuberculosis   . Psoriasis   . Psoriasis and similar disorder   . Pure hypercholesterolemia    takes Atorvastatin daily  . Sinusitis    takes Allegra nightly and FLonase prn  . Unspecified essential hypertension    takes Amlodipine,Losartan,CLonidine,Lisinopril,and Metoprolol daily   Past Surgical History:  Procedure Laterality Date  . CATARACT EXTRACTION W/PHACO Left 04/04/2014   Procedure: CATARACT EXTRACTION PHACO AND INTRAOCULAR LENS PLACEMENT (IOC);  Surgeon: Williams Che, MD;  Location: [facility];  Service: Ophthalmology;  Laterality: Left;  George [name]:4.49  . CATARACT EXTRACTION W/PHACO Right 01/02/2015   Procedure: CATARACT EXTRACTION PHACO AND INTRAOCULAR LENS  PLACEMENT; George [name]:  6.36;  Surgeon: Williams Che, MD;  Location: [facility];  Service: Ophthalmology;  Laterality: Right;  . CIRCUMCISION  2010  . CORONARY ARTERY BYPASS GRAFT N/A 08/13/2012   Procedure: CORONARY ARTERY BYPASS GRAFTING (CABG);  Surgeon: Ivin Poot, MD;  Location: [place];  Service: Open Heart Surgery;  Laterality: N/A;  . ENDOVEIN HARVEST OF GREATER SAPHENOUS VEIN Right 08/13/2012   Procedure: ENDOVEIN HARVEST OF GREATER SAPHENOUS VEIN;  Surgeon: Ivin Poot, MD;  Location: [place];  Service: Open Heart Surgery;  Laterality: Right;  . INTRAOPERATIVE TRANSESOPHAGEAL ECHOCARDIOGRAM N/A 08/13/2012   Procedure: INTRAOPERATIVE TRANSESOPHAGEAL ECHOCARDIOGRAM;  Surgeon: Ivin Poot, MD;  Location: [place];  Service: Open Heart Surgery;  Laterality: N/A;  . LEFT HEART CATHETERIZATION WITH CORONARY ANGIOGRAM N/A 08/07/2012   Procedure: LEFT HEART CATHETERIZATION WITH CORONARY ANGIOGRAM;  Surgeon: Peter M Martinique, MD;  Location: [facility] CATH LAB;  Service: Cardiovascular;  Laterality: N/A;   Social History   Socioeconomic History  . Marital status: Married    Spouse name: None  . Number of children: None  . Years of education: None  . Highest education level: None  Social  Needs  . Financial resource strain: None  . Food insecurity - worry: None  . Food insecurity - inability: None  . Transportation needs - medical: None  . Transportation needs - non-medical: None  Occupational History  . None  Tobacco Use  . Smoking status: Former Smoker    Packs/day: 1.00    Years: 4.00    Pack years: 4.00    Types: Cigarettes    Start date: 03/04/1965    Last attempt to quit: 03/05/1967    Years since quitting: 50.0  . Smokeless tobacco: Former Systems developer    Quit date: 03/30/1967  Substance and Sexual Activity  . Alcohol use: No    Alcohol/week: 0.0 oz  . Drug use: No  . Sexual activity: Yes    Birth control/protection: None  Other Topics Concern  . None  Social History Narrative  . None    Outpatient Encounter Medications as of 03/05/2017  Medication Sig  . [DISCONTINUED] Insulin Aspart (NOVOLOG FLEXPEN [place]) Inject 10-16 Units into the skin 3 (three) times daily before meals.  Marland Kitchen amLODipine (NORVASC) 5 MG tablet Take 1 tablet by mouth daily.  Marland Kitchen atorvastatin (LIPITOR) 20 MG tablet Take 20 mg by mouth daily.  . cloNIDine (CATAPRES) 0.1 MG tablet Take 1 tablet by mouth daily.  . Insulin Glargine (LANTUS SOLOSTAR) 100 UNIT/ML Solostar Pen Inject 50 Units into the skin at bedtime.  Marland Kitchen losartan (COZAAR) 100 MG tablet Take 1 tablet by mouth daily.  . metFORMIN (GLUCOPHAGE) 500 MG tablet Take 1 tablet (500 mg total) by mouth 2 (two) times daily with a meal.  . metoprolol succinate (TOPROL-XL) 50 MG 24 hr tablet Take 1 tablet (50 mg total) by mouth daily. Take with or immediately following a meal.  . nitroGLYCERIN (NITROSTAT) 0.4 MG SL tablet PLACE 1 TABLET UNDER TONGUE EVERY 5 MINUTES UP TO 3 DOSES, IF NO RELIEF CALL 911 -- EMERGENCY REFILL FAXED DR (Patient not taking: Reported on 02/26/2017)   No facility-administered encounter medications on file as of 03/05/2017.     ALLERGIES: No Known Allergies  VACCINATION STATUS:  There is no immunization history on file for this patient.  Diabetes  He presents for his follow-up diabetic visit. He has type 2 diabetes mellitus. Onset time: He was diagnosed at approximate age of 3 years. His disease course has been worsening. There are no hypoglycemic associated symptoms. Pertinent negatives for hypoglycemia include no confusion, headaches, pallor or seizures. Associated symptoms include blurred vision, polydipsia and polyuria. Pertinent negatives for diabetes include no chest pain, no fatigue, no polyphagia and no weakness. There are no hypoglycemic complications. Symptoms are worsening. Diabetic complications include heart disease. Risk factors for coronary artery disease include dyslipidemia, diabetes mellitus, family history, hypertension, male  sex, sedentary lifestyle and tobacco exposure. Current diabetic treatment includes insulin injections and oral agent (monotherapy). His weight is decreasing steadily. He is following a generally unhealthy diet. When asked about meal planning, he reported none. He has not had a previous visit with a dietitian. He never participates in exercise. His breakfast blood glucose range is generally 140-180 mg/dl. His lunch blood glucose range is generally >200 mg/dl. His dinner blood glucose range is generally >200 mg/dl. His bedtime blood glucose range is generally >200 mg/dl. His overall blood glucose range is >200 mg/dl. (He came with controlled fasting blood glucose profile, however significantly above target postprandial blood glucose profile.  His recent A1c was 9.5% on January 15, 2017.) An ACE inhibitor/angiotensin II receptor blocker is being taken.  He does not see a podiatrist.Eye exam is current.  Hyperlipidemia  This is a chronic problem. The current episode started more than 1 year ago. Exacerbating diseases include diabetes. Pertinent negatives include no chest pain, myalgias or shortness of breath. Current antihyperlipidemic treatment includes statins. Risk factors for coronary artery disease include dyslipidemia, diabetes mellitus, family history, male sex, hypertension and a sedentary lifestyle.  Hypertension  This is a chronic problem. The current episode started more than 1 year ago. Associated symptoms include blurred vision. Pertinent negatives include no chest pain, headaches, neck pain, palpitations or shortness of breath. Risk factors for coronary artery disease include dyslipidemia, diabetes mellitus, family history, sedentary lifestyle and smoking/tobacco exposure. Hypertensive end-organ damage includes CAD/MI.      Review of Systems  Constitutional: Negative for chills, fatigue, fever and unexpected weight change.  HENT: Positive for hearing loss. Negative for dental problem, mouth sores  and trouble swallowing.   Eyes: Positive for blurred vision. Negative for visual disturbance.  Respiratory: Negative for cough, choking, chest tightness, shortness of breath and wheezing.   Cardiovascular: Negative for chest pain, palpitations and leg swelling.  Gastrointestinal: Negative for abdominal distention, abdominal pain, constipation, diarrhea, nausea and vomiting.  Endocrine: Positive for polydipsia and polyuria. Negative for polyphagia.  Genitourinary: Negative for dysuria, flank pain, hematuria and urgency.  Musculoskeletal: Positive for gait problem. Negative for back pain, myalgias and neck pain.  Skin: Negative for pallor, rash and wound.  Neurological: Negative for seizures, syncope, weakness, numbness and headaches.  Psychiatric/Behavioral: Negative.  Negative for confusion and dysphoric mood.    Objective:    BP 136/73   Pulse (!) 59   Ht 6\' 3"  (1.905 m)   Wt 210 lb (95.3 kg)   BMI 26.25 kg/m   Wt Readings from Last 3 Encounters:  03/05/17 210 lb (95.3 kg)  02/26/17 212 lb (96.2 kg)  01/02/17 217 lb (98.4 kg)     Physical Exam  Constitutional: He is oriented to person, place, and time. He appears well-developed. He is cooperative. No distress.  HENT:  Head: Normocephalic and atraumatic.  Eyes: EOM are normal.  Neck: Normal range of motion. Neck supple. No tracheal deviation present. No thyromegaly present.  Cardiovascular: Normal rate, S1 normal, S2 normal and normal heart sounds. Exam reveals no gallop.  No murmur heard. Pulses:      Dorsalis pedis pulses are 0 on the right side, and 0 on the left side.       Posterior tibial pulses are 0 on the right side, and 0 on the left side.  Pulmonary/Chest: Breath sounds normal. No respiratory distress. He has no wheezes.  Abdominal: Soft. Bowel sounds are normal. He exhibits no distension. There is no tenderness. There is no guarding and no CVA tenderness.  Musculoskeletal: He exhibits no edema.       Right  shoulder: He exhibits no swelling and no deformity.  Neurological: He is alert and oriented to person, place, and time. He has normal strength and normal reflexes. No cranial nerve deficit or sensory deficit. Gait normal.  Hard of hearing  Skin: Skin is warm and dry. No rash noted. No cyanosis. Nails show no clubbing.  Psychiatric: He has a normal mood and affect. His speech is normal. Cognition and memory are normal.    CMP ( most recent) CMP     Component Value Date/Time   NA 135 12/27/2014 1542   K 4.2 12/27/2014 1542   CL 100 (L) 12/27/2014 1542   CO2  29 12/27/2014 1542   GLUCOSE 284 (H) 12/27/2014 1542   BUN 14 12/27/2014 1542   CREATININE 0.80 01/15/2017 1052   CREATININE 0.74 11/08/2013 1049   CALCIUM 9.2 12/27/2014 1542   PROT 6.9 08/11/2012 1104   ALBUMIN 3.8 08/11/2012 1104   AST 28 08/11/2012 1104   ALT 44 08/11/2012 1104   ALKPHOS 100 08/11/2012 1104   BILITOT 0.3 08/11/2012 1104   GFRNONAA >60 12/27/2014 1542   GFRAA >60 12/27/2014 1542     Diabetic Labs (most recent): Lab Results  Component Value Date   HGBA1C 9.5 01/15/2017   HGBA1C 7.8 (H) 08/11/2012     Assessment & Plan:   1. Uncontrolled type 2 diabetes mellitus with hyperglycemia ([place])  - George [name] has currently uncontrolled symptomatic type 2 DM since 72 years of age. -He came with controlled fasting profile on basal insulin, however significantly above target postprandial blood glucose profile.  His most recent A1c was high at 9.5%.  - Recent labs reviewed.  -his diabetes is complicated by coronary artery disease, peripheral arterial disease and George [name] remains at a high risk for more acute and chronic complications which include CAD, CVA, CKD, retinopathy, and neuropathy. These are all discussed in detail with the patient.  - I have counseled him on diet management and weight loss, by adopting a carbohydrate restricted/protein rich diet.  -  Suggestion is made for him to avoid  simple carbohydrates  from his diet including Cakes, Sweet Desserts / Pastries, Ice Cream, Soda (diet and regular), Sweet Tea, Candies, Chips, Cookies, Store Bought Juices, Alcohol in Excess of  1-2 drinks a day, Artificial Sweeteners, and "Sugar-free" Products. This will help patient to have stable blood glucose profile and potentially avoid unintended weight gain.   - I encouraged him to switch to  unprocessed or minimally processed complex starch and increased protein intake (animal or plant source), fruits, and vegetables.  - he is advised to stick to a routine mealtimes to eat 3 meals  a day and avoid unnecessary snacks ( to snack only to correct hypoglycemia).   - he will be scheduled with George [name], George [name], George [name] for individualized diabetes education.  - I have approached him with the following individualized plan to manage diabetes and patient agrees:   -Given his current glycemic burden, he will  require basal/bolus insulin in order for him to achieve control of diabetes to target.   -I have discussed this plan with him and his wife in the exam room who is offering help to him. - I have approached him to continue basal insulin Lantus at 50 units nightly, initiate NovoLog 10-16 units for pre-meal blood glucose above 90 mg/dL, associated with strict  monitoring blood glucose 4 times a day- before meals and at bedtime.   - Patient is warned not to take insulin without proper monitoring per orders.  -Patient is encouraged to call clinic for blood glucose levels less than 70 or above 300 mg /dl. -He has tolerated low-dose metformin, I have advised him to continue metformin 500 mg p.o. twice daily after meals.  - he will be considered for incretin therapy as appropriate next visit. - Patient specific target  A1c;  LDL, HDL, Triglycerides, and  Waist Circumference were discussed in detail.  2) BP/HTN: His blood pressure is controlled to target. I advised him to continue his current medications  including ACEI/ARB. 3) Lipids/HPL: No recent lipid panel to review.   Patient is advised to continue statins.  He will have fasting lipid panel before his next visit. 4)  Weight/Diet: George [name] Consult has been initiated , exercise, and detailed carbohydrates information provided.  5) Chronic Care/Health Maintenance:  -he  is on ACEI/ARB and Statin medications and  is encouraged to continue to follow up with Ophthalmology, Dentist,  Podiatrist at least yearly or according to recommendations, and advised to  stay away from smoking. I have recommended yearly flu vaccine and pneumonia vaccination at least every 5 years; moderate intensity exercise for up to 150 minutes weekly; and  sleep for at least 7 hours a day.  - I advised patient to maintain close follow up with Monico Blitz, MD for primary care needs.  - Time spent with the patient: 25 min, of which >50% was spent in reviewing his blood glucose logs , discussing his hypo- and hyper-glycemic episodes, reviewing his current and  previous labs and insulin doses and developing a plan to avoid hypo- and hyper-glycemia. Please refer to Patient Instructions for Blood Glucose Monitoring and Insulin/Medications Dosing Guide"  in media tab for additional information.   Follow up plan: - Return in about 7 weeks (around 04/23/2017) for follow up with pre-visit labs, meter, and logs.  Glade Lloyd, MD [facility] [address] [place], [place] [address] Phone: [phone]  Fax: [phone]    03/05/2017, 5:15 PM  This note was partially dictated with voice recognition software. Similar sounding words can be transcribed inadequately or may not  be corrected upon review.

## 2017-03-06 ENCOUNTER — Encounter: Payer: Medicare HMO | Attending: "Endocrinology | Admitting: Nutrition

## 2017-03-06 VITALS — Ht 75.0 in | Wt 214.0 lb

## 2017-03-06 DIAGNOSIS — E1165 Type 2 diabetes mellitus with hyperglycemia: Secondary | ICD-10-CM | POA: Insufficient documentation

## 2017-03-06 DIAGNOSIS — IMO0002 Reserved for concepts with insufficient information to code with codable children: Secondary | ICD-10-CM

## 2017-03-06 DIAGNOSIS — Z713 Dietary counseling and surveillance: Secondary | ICD-10-CM | POA: Diagnosis present

## 2017-03-06 DIAGNOSIS — E118 Type 2 diabetes mellitus with unspecified complications: Secondary | ICD-10-CM

## 2017-03-06 DIAGNOSIS — C61 Malignant neoplasm of prostate: Secondary | ICD-10-CM | POA: Diagnosis not present

## 2017-03-06 DIAGNOSIS — Z794 Long term (current) use of insulin: Secondary | ICD-10-CM | POA: Diagnosis not present

## 2017-03-06 NOTE — Progress Notes (Signed)
Medical Nutrition Therapy:  Appt start time: 1330 end time:  1430.   Assessment:  Primary concerns today: Diabetes Type 2.. He is here with his wife and granddaughter. Has just recently  beendiagnosed with prostate cancer and will talk to oncologist soon. Sees Dr. Dorris Fetch for Endocrinology. Wife is with hiim and shops and does the cooking.  He is eating  3 meals per day. Says he has been starving and hungry. 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 Wt stable.  Has had DM  for 10 yr+.  Testing 4 times a day.    Metformin 500 mg BID. Lantus  50 units and Novolog with meals Novolog with meals. 10 units and sliding scales just started yesterday.    DIet is insuffient in carbs and inconsistent to meet his needs. HIs wife is supportive and trying to follow guidelines with meals.  He is engaged and motivated to follow guidelines to better manage his diabetes.  Lab Results  Component Value Date   HGBA1C 9.5 01/15/2017    CMP Latest Ref Rng & Units 01/15/2017 12/27/2014 03/30/2014  Glucose 65 - 99 mg/dL - 284(H) 275(H)  BUN 6 - 20 mg/dL - 14 15  Creatinine 0.61 - 1.24 mg/dL 0.80 0.83 0.79  Sodium 135 - 145 mmol/L - 135 136  Potassium 3.5 - 5.1 mmol/L - 4.2 4.4  Chloride 101 - 111 mmol/L - 100(L) 102  CO2 22 - 32 mmol/L - 29 27  Calcium 8.9 - 10.3 mg/dL - 9.2 9.1  Total Protein 6.0 - 8.3 g/dL - - -  Total Bilirubin 0.3 - 1.2 mg/dL - - -  Alkaline Phos 39 - 117 U/L - - -  AST 0 - 37 U/L - - -  ALT 0 - 53 U/L - - -  Lipid Panel   Preferred Learning Style:   No preference indicated   Learning Readiness:   N  Change in progress   MEDICATIONS:    DIETARY INTAKE:   24-hr recall:  B ( AM): Oatmeal 2-3 cups,  coffee  Snk ( AM):   L ( PM): Hamsalad with 2 bread, bread and butter pickels, 5/6, Milk 2% milk, 10-12 oz.  Snk ( PM):  D ( PM): Ham slices, turnip greens and pinto beans.1 cup, milk 10-12 oz. Snk ( PM): water Beverages: water and milk   Usual physical  activity: ADL    Estimated energy needs: 2000  calories 225 g carbohydrates 150 g protein 56 g fat  Progress Towards Goal(s):  In progress.   Nutritional Diagnosis:  NB-1.1 Food and nutrition-related knowledge deficit As related to Diabetes.  As evidenced by A1C 9.5%.    Intervention:  Nutrition and Diabetes education provided on My Plate, CHO counting, meal planning, portion sizes, timing of meals, avoiding snacks between meals unless having a low blood sugar, target ranges for A1C and blood sugars, signs/symptoms and treatment of hyper/hypoglycemia, monitoring blood sugars, taking medications as prescribed, benefits of exercising 30 minutes per day and prevention of complications of DM.  Goals 1.  Follow My Plate 2. Eat 4-5 carb choices per meal 3. Keep drinking water 4. Increase frehs fruits and vegetables dialy 5.  Test blood sugars 4 times per day and use sliding scale as directed 6. Keep juice, or  fruitcup  or candy with you incase your blood sugar drops. 7. Don't skip meals.   Teaching Method Utilized:  Visual Auditory Hands on  Handouts given during  visit include:  The Plate Method   Meal Plan Card   Barriers to learning/adherence to lifestyle change: none  Demonstrated degree of understanding via:  Teach Back   Monitoring/Evaluation:  Dietary intake, exercise, meal planning, SBG, and body weight in 1 month(s).

## 2017-03-06 NOTE — Patient Instructions (Addendum)
Goals 1.  Follow My Plate 2. Eat 4-5 carb choices per meal 3. Keep drinking water 4. Increase frehs fruits and vegetables dialy 5.  Test blood sugars 4 times per day and use sliding scale as directed 6. Keep juice, or  fruitcup  or candy with you incase your blood sugar drops. 7. Don't skip meals.

## 2017-04-03 ENCOUNTER — Encounter: Payer: Self-pay | Admitting: Radiation Oncology

## 2017-04-17 ENCOUNTER — Encounter: Payer: Medicare HMO | Attending: "Endocrinology | Admitting: Nutrition

## 2017-04-17 VITALS — Ht 75.0 in | Wt 214.8 lb

## 2017-04-17 DIAGNOSIS — IMO0002 Reserved for concepts with insufficient information to code with codable children: Secondary | ICD-10-CM

## 2017-04-17 DIAGNOSIS — E1165 Type 2 diabetes mellitus with hyperglycemia: Secondary | ICD-10-CM | POA: Diagnosis not present

## 2017-04-17 DIAGNOSIS — Z713 Dietary counseling and surveillance: Secondary | ICD-10-CM | POA: Diagnosis present

## 2017-04-17 DIAGNOSIS — E118 Type 2 diabetes mellitus with unspecified complications: Secondary | ICD-10-CM

## 2017-04-17 NOTE — Patient Instructions (Signed)
Goals 1. Keep eating 45-60 grams of carbs 2. Get A1C  To 7%. 3. Keep following meals plan and watch portion sizes 4. If hungry eat vegetalbe. 5. Drink water.

## 2017-04-17 NOTE — Progress Notes (Signed)
  Medical Nutrition Therapy:  Appt start time: 1330 end time:  1430.   Assessment:  Primary concerns today: Diabetes Type 2.. He is here with his wife. He notes he is eating a lot better. He isn't starving anymore and feels much better. BS are better in the 150-180's range mostly. He is eating about 45-60 grams. Drinking water. Cut out snacks. Drinking water. He gained back about 4 lbs with better blood sugar control. He is packing his lunch and taking to work. No low blood sugars.  Lantus 50 units, Metformin 500 mg BID.  He is engaged and motivated to follow guidelines to better manage his diabetes. Making great progress.  Lab Results  Component Value Date   HGBA1C 9.5 01/15/2017    CMP Latest Ref Rng & Units 01/15/2017 12/27/2014 03/30/2014  Glucose 65 - 99 mg/dL - 284(H) 275(H)  BUN 6 - 20 mg/dL - 14 15  Creatinine 0.61 - 1.24 mg/dL 0.80 0.83 0.79  Sodium 135 - 145 mmol/L - 135 136  Potassium 3.5 - 5.1 mmol/L - 4.2 4.4  Chloride 101 - 111 mmol/L - 100(L) 102  CO2 22 - 32 mmol/L - 29 27  Calcium 8.9 - 10.3 mg/dL - 9.2 9.1  Total Protein 6.0 - 8.3 g/dL - - -  Total Bilirubin 0.3 - 1.2 mg/dL - - -  Alkaline Phos 39 - 117 U/L - - -  AST 0 - 37 U/L - - -  ALT 0 - 53 U/L - - -  Lipid Panel   Preferred Learning Style:   No preference indicated   Learning Readiness:   N  Change in progress   MEDICATIONS:    DIETARY INTAKE:   24-hr recall:  B ( AM): Oatmeal 2-3 cups,  coffee  Snk ( AM):   L ( PM): Hamsalad with 2 bread, bread and butter pickels, 5/6, Milk 2% milk, 10-12 oz.  Snk ( PM):  D ( PM): Ham slices, turnip greens and pinto beans.1 cup, milk 10-12 oz. Snk ( PM): water Beverages: water and milk   Usual physical activity: ADL    Estimated energy needs: 2000  calories 225 g carbohydrates 150 g protein 56 g fat  Progress Towards Goal(s):  In progress.   Nutritional Diagnosis:  NB-1.1 Food and nutrition-related knowledge deficit As related to Diabetes.   As evidenced by A1C 9.5%.    Intervention:  Nutrition and Diabetes education provided on My Plate, CHO counting, meal planning, portion sizes, timing of meals, avoiding snacks between meals unless having a low blood sugar, target ranges for A1C and blood sugars, signs/symptoms and treatment of hyper/hypoglycemia, monitoring blood sugars, taking medications as prescribed, benefits of exercising 30 minutes per day and prevention of complications of DM.   Goals 1. Keep eating 45-60 grams of carbs 2. Get A1C  To 7%. 3. Keep following meals plan and watch portion sizes 4. If hungry eat vegetalbe. 5. Drink water.  Teaching Method Utilized:  Visual Auditory Hands on  Handouts given during visit include:  The Plate Method   Meal Plan Card   Barriers to learning/adherence to lifestyle change: none  Demonstrated degree of understanding via:  Teach Back   Monitoring/Evaluation:  Dietary intake, exercise, meal planning, SBG, and body weight in 3 month(s).

## 2017-04-23 ENCOUNTER — Encounter: Payer: Self-pay | Admitting: "Endocrinology

## 2017-04-23 LAB — BASIC METABOLIC PANEL
BUN: 14 (ref 4–21)
CREATININE: 0.8 (ref <=1.3)

## 2017-04-23 LAB — LIPID PANEL
Cholesterol: 103 (ref 0–200)
HDL: 34 — AB (ref 35–70)
LDL CALC: 44
Triglycerides: 123 (ref 40–160)

## 2017-04-23 LAB — TSH: TSH: 2.92 (ref <=5.90)

## 2017-04-23 LAB — HEMOGLOBIN A1C: HEMOGLOBIN A1C: 8.2

## 2017-04-23 LAB — VITAMIN D 25 HYDROXY (VIT D DEFICIENCY, FRACTURES): Vit D, 25-Hydroxy: 38

## 2017-04-24 ENCOUNTER — Ambulatory Visit: Payer: Medicare HMO | Admitting: "Endocrinology

## 2017-04-24 ENCOUNTER — Encounter: Payer: Self-pay | Admitting: Nutrition

## 2017-04-30 ENCOUNTER — Encounter: Payer: Self-pay | Admitting: Radiation Oncology

## 2017-04-30 NOTE — Progress Notes (Signed)
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 Prostate volume: 23 cc.  08/19/2016 PSA  6.6  George [name] was referred by Dr. Monico Blitz, MD to Dr. Jeffie Pollock for further evaluation of an elevated PSA.  Biopsies of prostate (if applicable) revealed:    Past/Anticipated interventions by urology, if any: biopsy, bone scan and CT scan did not suggest metastatic disease, referral to Dr. Alinda Money (to discuss surgery), referral to Dr. Tammi Klippel to discuss radiation option The patient tells me that Dr. Alinda Money said he is not a appropriate for surgery  Past/Anticipated interventions by medical oncology, if any: no  Weight changes, if any: He reports losing about 1 lb every 3 months monitored at his PCP office.   Bowel/Bladder complaints, if any: weak stream, nocturia x 3   Nausea/Vomiting, if any: No  Pain issues, if any:  No  SAFETY ISSUES:  Prior radiation? No  Pacemaker/ICD? No  Possible current pregnancy? no  Is the patient on methotrexate? No  Current Complaints / other details:  72 year old male. Married. Continues to work as a Acupuncturist. Reports lung ca runs in family.  [name] has asked me if he can receive treatment in [place], closer to where he lives.   BP (!) 162/68   Pulse (!) 54   Temp 98.5 F (36.9 C)   Resp 18   Ht 6\' 3"  (1.905 m)   Wt 212 lb 3.2 oz (96.3 kg)   SpO2 99% Comment: room air  BMI 26.52 kg/m    Wt Readings from Last 3 Encounters:  05/01/17 212 lb 3.2 oz (96.3 kg)  04/17/17 214 lb 12.8 oz (97.4 kg)  03/06/17 214 lb (97.1 kg)

## 2017-05-01 ENCOUNTER — Encounter: Payer: Self-pay | Admitting: Medical Oncology

## 2017-05-01 ENCOUNTER — Other Ambulatory Visit: Payer: Self-pay

## 2017-05-01 ENCOUNTER — Encounter: Payer: Self-pay | Admitting: General Practice

## 2017-05-01 ENCOUNTER — Ambulatory Visit
Admission: RE | Admit: 2017-05-01 | Discharge: 2017-05-01 | Disposition: A | Discharge status: Home / Self Care | Payer: Medicare HMO | Source: Ambulatory Visit | Attending: Radiation Oncology | Admitting: Radiation Oncology

## 2017-05-01 ENCOUNTER — Encounter: Payer: Self-pay | Admitting: Radiation Oncology

## 2017-05-01 VITALS — BP 162/68 | HR 54 | Temp 98.5°F | Resp 18 | Ht 75.0 in | Wt 212.2 lb

## 2017-05-01 DIAGNOSIS — E1140 Type 2 diabetes mellitus with diabetic neuropathy, unspecified: Secondary | ICD-10-CM | POA: Insufficient documentation

## 2017-05-01 DIAGNOSIS — I2510 Atherosclerotic heart disease of native coronary artery without angina pectoris: Secondary | ICD-10-CM | POA: Diagnosis not present

## 2017-05-01 DIAGNOSIS — Z87891 Personal history of nicotine dependence: Secondary | ICD-10-CM | POA: Diagnosis not present

## 2017-05-01 DIAGNOSIS — C61 Malignant neoplasm of prostate: Secondary | ICD-10-CM | POA: Diagnosis not present

## 2017-05-01 DIAGNOSIS — Z79899 Other long term (current) drug therapy: Secondary | ICD-10-CM | POA: Diagnosis not present

## 2017-05-01 DIAGNOSIS — I10 Essential (primary) hypertension: Secondary | ICD-10-CM | POA: Insufficient documentation

## 2017-05-01 DIAGNOSIS — L409 Psoriasis, unspecified: Secondary | ICD-10-CM | POA: Diagnosis not present

## 2017-05-01 DIAGNOSIS — Z794 Long term (current) use of insulin: Secondary | ICD-10-CM | POA: Insufficient documentation

## 2017-05-01 DIAGNOSIS — M159 Polyosteoarthritis, unspecified: Secondary | ICD-10-CM | POA: Insufficient documentation

## 2017-05-01 DIAGNOSIS — M064 Inflammatory polyarthropathy: Secondary | ICD-10-CM | POA: Insufficient documentation

## 2017-05-01 DIAGNOSIS — E7800 Pure hypercholesterolemia, unspecified: Secondary | ICD-10-CM | POA: Diagnosis not present

## 2017-05-01 DIAGNOSIS — K219 Gastro-esophageal reflux disease without esophagitis: Secondary | ICD-10-CM | POA: Insufficient documentation

## 2017-05-01 DIAGNOSIS — Z951 Presence of aortocoronary bypass graft: Secondary | ICD-10-CM | POA: Diagnosis not present

## 2017-05-01 DIAGNOSIS — G8929 Other chronic pain: Secondary | ICD-10-CM | POA: Insufficient documentation

## 2017-05-01 HISTORY — DX: Malignant neoplasm of prostate: C61

## 2017-05-01 NOTE — Progress Notes (Signed)
Introduced myself to [facility] and his wife as the Art therapist and my role. He states he consulted with Dr. Alinda Money and he did not feel he was a candidate for surgery due to his cardiac history, diabetes and his age. He is here to discuss his radiation options and voices he is ready to get started with treatment. I gave him my business card and asked him to call me with questions or concerns. He lives in [place] and would like to be treated closer to home if possible.

## 2017-05-01 NOTE — Progress Notes (Signed)
Radiation Oncology         [phone] ________________________________  Initial Outpatient Consultation  Name: George [name] MRN: [id number]  Date: 05/01/2017  DOB: 1945-09-15  JJ:HERD, Weldon Picking, MD  Raynelle Bring, MD   REFERRING PHYSICIAN: Raynelle Bring, MD  DIAGNOSIS: 72 y.o. Stage T2a adenocarcinoma of the prostate with Gleason Score of 5+3, and PSA of 6.5    ICD-10-CM   1. Malignant neoplasm of prostate ([place]) C61 Ambulatory referral to Social Work    HISTORY OF PRESENT ILLNESS: George [name] is a 72 y.o. male with a diagnosis of prostate cancer. He was noted to have an elevated PSA of 6.6 by his primary care physician, Dr. Manuella Ghazi.  Accordingly, he was referred for evaluation in urology to Dr. Jeffie Pollock on 01/03/17; digital rectal examination was performed at that time revealing a 29m left mid gland nodule, and repeat PSA was 6.5.  The patient proceeded to transrectal ultrasound with 12 biopsies of the prostate on 01/24/17.  The prostate volume measured 23 cc.  Out of 12 core biopsies, 9 were positive.  The maximum Gleason score was 5+3, and this was seen in the left mid gland.  Additionally, there was Gleason 4+3 disease in the left base, left base lateral and left mid lateral, Gleason 3+4 disease in the left apex, right base and right base lateral as well as Gleason 3+3 disease in the right apex and right mid lateral.    Staging studies with CT pelvis 02/20/17 and bone scan 02/11/17 did not suggest evidence of metastatic disease.   He met with Dr. BAlinda Moneyon 04/01/2017 to discuss the potential option of prostatectomy but reports that he is not felt to be an ideal candidate for surgery in light of his PMH with CAD and DM as well as his age.  He reviewed the biopsy results with his urologist and he has kindly been referred today for discussion of potential radiation treatment options. He is accompanied today by his wife.  PREVIOUS RADIATION THERAPY: No  PAST MEDICAL HISTORY:  Past Medical  History:  Diagnosis Date  . Acute upper respiratory infections of unspecified site   . Cancer ([facility]    Prostate  . Chest pain   . Chronic back pain    buldging disc and inflammatory arthritis  . Colitis, enteritis, and gastroenteritis of presumed infectious origin   . Confusion    occasionally short term  . Coronary artery disease   . Dermatophytosis of the body   . [place]    takes Lantus and metformina and amaryl daily  . [place]abetic neuropathy [place]   . Dizziness   . Generalized osteoarthrosis, unspecified site   . GERD (gastroesophageal reflux disease)    takes Omeprazole daily  . Herpes zoster without mention of complication   . History of bronchitis many yrs ago  . HOH (hard of hearing)   . Joint pain   . Lumbago   . Other dyspnea and respiratory abnormality    with exertion  . Pleurisy without mention of effusion or current tuberculosis   . [place]state cancer ([place]   . Psoriasis   . Psoriasis and similar disorder   . Pure hypercholesterolemia    takes Atorvastatin daily  . Sinusitis    takes Allegra nightly and FLonase prn  . Unspecified essential hypertension    takes Amlodipine,Losartan,CLonidine,Lisinopril,and Metoprolol daily      PAST SURGICAL HISTORY: Past Surgical History:  Procedure Laterality Date  . CATARACT EXTRACTION W/PHACO Left  04/04/2014   Procedure: CATARACT EXTRACTION PHACO AND INTRAOCULAR LENS PLACEMENT (IOC);  Surgeon: Williams Che, MD;  Location: [facility];  Service: Ophthalmology;  Laterality: Left;  CDE:4.49  . CATARACT EXTRACTION W/PHACO Right 01/02/2015   Procedure: CATARACT EXTRACTION PHACO AND INTRAOCULAR LENS PLACEMENT; CDE:  6.36;  Surgeon: Williams Che, MD;  Location: [facility];  Service: Ophthalmology;  Laterality: Right;  . CIRCUMCISION  2010  . CORONARY ARTERY BYPASS GRAFT N/A 08/13/2012   Procedure: CORONARY ARTERY BYPASS GRAFTING (CABG);  Surgeon: Ivin Poot, MD;  Location: [place];  Service:  Open Heart Surgery;  Laterality: N/A;  . ENDOVEIN HARVEST OF GREATER SAPHENOUS VEIN Right 08/13/2012   Procedure: ENDOVEIN HARVEST OF GREATER SAPHENOUS VEIN;  Surgeon: Ivin Poot, MD;  Location: [place];  Service: Open Heart Surgery;  Laterality: Right;  . INTRAOPERATIVE TRANSESOPHAGEAL ECHOCARDIOGRAM N/A 08/13/2012   Procedure: INTRAOPERATIVE TRANSESOPHAGEAL ECHOCARDIOGRAM;  Surgeon: Ivin Poot, MD;  Location: [place];  Service: Open Heart Surgery;  Laterality: N/A;  . LEFT HEART CATHETERIZATION WITH CORONARY ANGIOGRAM N/A 08/07/2012   Procedure: LEFT HEART CATHETERIZATION WITH CORONARY ANGIOGRAM;  Surgeon: Peter M Martinique, MD;  Location: [facility] CATH LAB;  Service: Cardiovascular;  Laterality: N/A;    FAMILY HISTORY:  Family History  Problem Relation Age of Onset  . CAD Unknown   . Heart attack Unknown   . Diabetes Unknown     SOCIAL HISTORY:  He continues working actively as a Acupuncturist. Social History   Socioeconomic History  . Marital status: Married    Spouse name: Not on file  . Number of children: Not on file  . Years of education: Not on file  . Highest education level: Not on file  Occupational History    Comment: logger  Social Needs  . Financial resource strain: Not on file  . Food insecurity:    Worry: Not on file    Inability: Not on file  . Transportation needs:    Medical: Not on file    Non-medical: Not on file  Tobacco Use  . Smoking status: Former Smoker    Packs/day: 1.00    Years: 4.00    Pack years: 4.00    Types: Cigarettes    Start date: 03/04/1965    Last attempt to quit: 03/05/1967    Years since quitting: 50.1  . Smokeless tobacco: Never Used  Substance and Sexual Activity  . Alcohol use: No    Alcohol/week: 0.0 oz  . Drug use: No  . Sexual activity: Yes    Birth control/protection: None  Lifestyle  . Physical activity:    Days per week: Not on file    Minutes per session: Not on file  . Stress: Not on file  Relationships  . Social  connections:    Talks on phone: Not on file    Gets together: Not on file    Attends religious service: Not on file    Active member of club or organization: Not on file    Attends meetings of clubs or organizations: Not on file    Relationship status: Not on file  . Intimate partner violence:    Fear of current or ex partner: Not on file    Emotionally abused: Not on file    Physically abused: Not on file    Forced sexual activity: Not on file  Other Topics Concern  . 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  ALLERGIES: Patient has no known allergies.  MEDICATIONS:  Current Outpatient Medications  Medication Sig Dispense Refill  . amLODipine (NORVASC) 5 MG tablet Take 1 tablet by mouth daily.    Marland Kitchen atorvastatin (LIPITOR) 20 MG tablet Take 20 mg by mouth daily.    . Blood Glucose Monitoring Suppl (TRUE METRIX AIR GLUCOSE METER) w/Device KIT See admin instructions. Blood Glucose Monitoring Supplies (True Metrix Air Glucose Meter)  0  . cloNIDine (CATAPRES) 0.1 MG tablet Take 1 tablet by mouth daily.    Marland Kitchen GLOBAL EASE INJECT PEN NEEDLES 31G X 5 MM MISC 3 (three) times daily.  5  . insulin aspart (NOVOLOG FLEXPEN) 100 UNIT/ML FlexPen Inject 10-16 Units into the skin 3 (three) times daily before meals. 15 mL 2  . Insulin Glargine (LANTUS SOLOSTAR) 100 UNIT/ML Solostar Pen Inject 50 Units into the skin at bedtime.    Marland Kitchen losartan (COZAAR) 100 MG tablet Take 1 tablet by mouth daily.    . metFORMIN (GLUCOPHAGE) 500 MG tablet Take 1 tablet (500 mg total) by mouth 2 (two) times daily with a meal. 60 tablet 2  . metoprolol succinate (TOPROL-XL) 50 MG 24 hr tablet Take 1 tablet (50 mg total) by mouth daily. Take with or immediately following a meal. 30 tablet 1  . NON FORMULARY True Metrix Strips (DiabeticClub50)  12  . nitroGLYCERIN (NITROSTAT) 0.4 MG SL tablet PLACE 1 TABLET UNDER TONGUE EVERY 5 MINUTES UP TO 3 DOSES, IF NO RELIEF CALL 911 --  EMERGENCY REFILL FAXED DR (Patient not taking: Reported on 02/26/2017) 25 tablet 3   No current facility-administered medications for this encounter.     REVIEW OF SYSTEMS:  On review of systems, the patient reports that he is doing well overall. He denies any chest pain, shortness of breath, cough, fevers, chills, night sweats, or unintended weight changes. He denies any bowel disturbances, and denies abdominal pain, nausea or vomiting. He denies any new musculoskeletal or joint aches or pains. His IPSS was 14, indicating moderate urinary symptoms of weak stream and nocturia x3. He reports severe erectile dysfunction. A complete review of systems is obtained and is otherwise negative.    PHYSICAL EXAM:  Wt Readings from Last 3 Encounters:  05/01/17 212 lb 3.2 oz (96.3 kg)  04/17/17 214 lb 12.8 oz (97.4 kg)  03/06/17 214 lb (97.1 kg)   Temp Readings from Last 3 Encounters:  05/01/17 98.5 F (36.9 C)  01/24/17 (!) 97.3 F (36.3 C) (Oral)  01/02/15 98.1 F (36.7 C) (Oral)   BP Readings from Last 3 Encounters:  05/01/17 (!) 162/68  03/05/17 136/73  02/26/17 136/76   Pulse Readings from Last 3 Encounters:  05/01/17 (!) 54  03/05/17 (!) 59  02/26/17 66   Pain Assessment Pain Score: 0-No pain/10  In general this is a well appearing caucasian male in no acute distress. He is alert and oriented x4 and appropriate throughout the examination. HEENT reveals that the patient is normocephalic, atraumatic. EOMs are intact. PERRLA. Skin is intact without any evidence of gross lesions. Cardiovascular exam reveals a regular rate and rhythm, no clicks rubs or murmurs are auscultated. Chest is clear to auscultation bilaterally. Lymphatic assessment is performed and does not reveal any adenopathy in the cervical, supraclavicular, axillary, or inguinal chains. Abdomen has active bowel sounds in all quadrants and is intact. The abdomen is soft, non tender, non distended. Lower extremities are negative  for pretibial pitting edema, deep calf tenderness, cyanosis or clubbing.   KPS =  100  100 - Normal; no complaints; no evidence of disease. 90   - Able to carry on normal activity; minor signs or symptoms of disease. 80   - Normal activity with effort; some signs or symptoms of disease. 74   - Cares for self; unable to carry on normal activity or to do active work. 60   - Requires occasional assistance, but is able to care for most of his personal needs. 50   - Requires considerable assistance and frequent medical care. 25   - Disabled; requires special care and assistance. 74   - Severely disabled; hospital admission is indicated although death not imminent. 30   - Very sick; hospital admission necessary; active supportive treatment necessary. 10   - Moribund; fatal processes progressing rapidly. 0     - Dead  Karnofsky DA, Abelmann [place], Craver LS and Burchenal [facility] [phone]) The use of the nitrogen mustards in the palliative treatment of carcinoma: with particular reference to bronchogenic carcinoma Cancer 1 [id number]  LABORATORY DATA:  Lab Results  Component Value Date   WBC 6.0 12/27/2014   HGB 14.0 12/27/2014   HCT 40.3 12/27/2014   MCV 82.4 12/27/2014   PLT 181 12/27/2014   Lab Results  Component Value Date   NA 135 12/27/2014   K 4.2 12/27/2014   CL 100 (L) 12/27/2014   CO2 29 12/27/2014   Lab Results  Component Value Date   ALT 44 08/11/2012   AST 28 08/11/2012   ALKPHOS 100 08/11/2012   BILITOT 0.3 08/11/2012     RADIOGRAPHY: No results found.    IMPRESSION/PLAN: 1. 72 y.o. gentleman with Stage T2a adenocarcinoma of the prostate with Gleason Score of 5+3, and PSA of 6.5. We discussed the patient's workup and outlined the nature of prostate cancer in this setting. The patient's T stage, Gleason's score, and PSA put him into the high risk group. Accordingly, he is eligible for a variety of potential treatment options including prostatectomy or LT-ADT in combination with  either 8 weeks of external radiation or 5 weeks of external radiation combined with a brachytherapy boost. We discussed the available radiation techniques, and focused on the details and logistics and delivery.  We discussed and outlined the risks, benefits, short and long-term effects associated with radiotherapy and compared and contrasted these with prostatectomy. We discussed the role of SpaceOAR in reducing the rectal toxicity associated with radiotherapy. We also detailed the role of ADT in the treatment of high risk prostate cancer and outlined the associated side effects that could be expected with this therapy.  At the conclusion of our conversation, the patient is leaning towards LT-ADT in combination with 5 weeks of external radiation followed by brachytherapy boost for treatment of his prostate cancer. He lives in [place] and prefers to have the daily radiotherapy performed at [facility] since this is much closer to his home.  We will share our findings with Dr. Jeffie Pollock and move forward with scheduling initiation of ADT in the near future.  We will also work to coordinate for fiducial marker placement +/- SpaceOAR within the next 4-6 weeks in preparation to begin radiotherapy approximately 8 weeks after starting ADT.  Referral will be placed for consultation with Dr. Quitman Livings in [place] for consideration of 5 weeks of prostate IMRT preceding brachytherapy boost.  We enjoyed meeting him today and look forward to participating in his care.   We spent 60 minutes face to face with the patient and more than 50% of  that time was spent in counseling and/or coordination of care.   Nicholos Johns, PA-C    Tyler Pita, MD  [facility] Direct Dial: [phone]  Fax: [phone] [email or web address]  Skype  LinkedIn    Page Me   This document serves as a record of services personally performed by Tyler Pita, MD and Freeman Caldron, PA-C. It was created on their behalf  by Rae Lips, a trained medical scribe. The creation of this record is based on the scribe's personal observations and the providers' statements to them. This document has been checked and approved by the attending providers.

## 2017-05-02 ENCOUNTER — Telehealth: Payer: Self-pay | Admitting: Urology

## 2017-05-02 ENCOUNTER — Encounter: Payer: Self-pay | Admitting: General Practice

## 2017-05-02 NOTE — Progress Notes (Signed)
[place] Psychosocial Distress Screening Clinical Social Work  Clinical Social Work was referred by distress screening protocol.  The patient scored a 10 on the Psychosocial Distress Thermometer which indicates severe distress. Clinical Social Worker Edwyna Shell to assess for distress and other psychosocial needs. Unalbe to reach patient during day due to work schedules.  Spoke w wife and discussed common feeling and emotions when being diagnosed with cancer, and the importance of support during treatment. CSW informed wife of the support team and support services at [facility] as well as resources available in [facility] through [employer]. CSW provided contact information and encouraged patient to call with any questions or concerns.  Will mail information packet re [place].  ONCBCN DISTRESS SCREENING 05/01/2017  Screening Type Initial Screening  Distress experienced in past week (1-10) 10  Practical problem type Work/school  Family Problem type Partner  Emotional problem type Depression;Feeling hopeless  Information Concerns Type Lack of info about treatment  Physical Problem type Constipation/diarrhea;Changes in urination     Clinical Social Worker follow up needed: No.  If yes, follow up plan:   Edwyna Shell, LCSW Clinical Social Worker Phone:  [phone]

## 2017-05-02 NOTE — Telephone Encounter (Signed)
I called and left a message on the patient's voicemail regarding the conversation Dr. Tammi Klippel had with Dr. Quitman Livings in [place].  Dr. Raliegh Ip does not want to do EBRT with our seed implant with SpaceOAR, because he is worried they don't have cone beam CT to see the anatomy. So, the recommendation at this point is for 8 weeks of EBRT in [place] or 5 weeks of EBRT in [place] followed by brachytherapy boost.  I requested that the patient return my call with his treatment preference next week but indicated that I would be out of the office and that he could leave that information with Romie Jumper so that she could move forward with coordinating his treatment accordingly or, if he had further questions, he could asked to speak with Worthy Flank, PA-C in my absence.   Nicholos Johns, PA-C

## 2017-05-06 ENCOUNTER — Ambulatory Visit (INDEPENDENT_AMBULATORY_CARE_PROVIDER_SITE_OTHER): Payer: Medicare HMO | Admitting: "Endocrinology

## 2017-05-06 ENCOUNTER — Encounter: Payer: Self-pay | Admitting: "Endocrinology

## 2017-05-06 ENCOUNTER — Encounter: Payer: Medicare HMO | Attending: Internal Medicine | Admitting: Nutrition

## 2017-05-06 ENCOUNTER — Telehealth: Payer: Self-pay | Admitting: *Deleted

## 2017-05-06 VITALS — BP 130/80 | HR 62 | Ht 75.0 in | Wt 213.0 lb

## 2017-05-06 VITALS — Ht 75.0 in | Wt 213.0 lb

## 2017-05-06 DIAGNOSIS — Z6826 Body mass index (BMI) 26.0-26.9, adult: Secondary | ICD-10-CM | POA: Insufficient documentation

## 2017-05-06 DIAGNOSIS — E1165 Type 2 diabetes mellitus with hyperglycemia: Secondary | ICD-10-CM

## 2017-05-06 DIAGNOSIS — E118 Type 2 diabetes mellitus with unspecified complications: Secondary | ICD-10-CM

## 2017-05-06 DIAGNOSIS — Z713 Dietary counseling and surveillance: Secondary | ICD-10-CM | POA: Diagnosis present

## 2017-05-06 DIAGNOSIS — IMO0002 Reserved for concepts with insufficient information to code with codable children: Secondary | ICD-10-CM

## 2017-05-06 DIAGNOSIS — E782 Mixed hyperlipidemia: Secondary | ICD-10-CM

## 2017-05-06 DIAGNOSIS — I10 Essential (primary) hypertension: Secondary | ICD-10-CM

## 2017-05-06 MED ORDER — FREESTYLE LIBRE 14 DAY READER DEVI: 1.0000 | Freq: Once | 0 refills | Status: AC

## 2017-05-06 MED ORDER — FREESTYLE LIBRE 14 DAY SENSOR MISC: 1.0000 | 2 refills | Status: DC

## 2017-05-06 NOTE — Patient Instructions (Addendum)
Keep up the great job! Eat three meals per day Continue to eat fresh fruits and vegetables Increase protein as desired with meals Drink fluids Eat 60-75 grams of carbs per meal

## 2017-05-06 NOTE — Progress Notes (Signed)
  Medical Nutrition Therapy:  Appt start time: 1630 end time:  1645  Assessment:  Primary concerns today: Diabetes Type 2.. He is here with his wife. He notes he is eating a lot better. He isn't starving anymore and feels much better. BS are better in the 150-180's range mostly. He is eating about 45-60 grams. Drinking water. Cut out snacks. Drinking water. He gained back about 4 lbs with better blood sugar control. He is packing his lunch and taking to work. No low blood sugars.  Lantus 50 units, Metformin 500 mg BID. Humalog 10 units plus sliding scale insulin.   He is engaged and motivated to follow guidelines to better manage his diabetes. Making great progress.  Lab Results  Component Value Date   HGBA1C 8.2 04/23/2017    CMP Latest Ref Rng & Units 04/23/2017 01/15/2017 12/27/2014  Glucose 65 - 99 mg/dL - - 284(H)  BUN 4 - 21 14 - 14  Creatinine 0.6 - 1.3 0.8 0.80 0.83  Sodium 135 - 145 mmol/L - - 135  Potassium 3.5 - 5.1 mmol/L - - 4.2  Chloride 101 - 111 mmol/L - - 100(L)  CO2 22 - 32 mmol/L - - 29  Calcium 8.9 - 10.3 mg/dL - - 9.2  Total Protein 6.0 - 8.3 g/dL - - -  Total Bilirubin 0.3 - 1.2 mg/dL - - -  Alkaline Phos 39 - 117 U/L - - -  AST 0 - 37 U/L - - -  ALT 0 - 53 U/L - - -  Lipid Panel   Preferred Learning Style:   No preference indicated   Learning Readiness:   N  Change in progress   MEDICATIONS:    DIETARY INTAKE:   24-hr recall:  B ( AM): Oatmeal 2-3 cups,  coffee  Snk ( AM):   L ( PM): Hamsalad with 2 bread, bread and butter pickels, 5/6, Milk 2% milk, 10-12 oz.  Snk ( PM):  D ( PM): Ham slices, turnip greens and pinto beans.1 cup, milk 10-12 oz. Snk ( PM): water Beverages: water and milk   Usual physical activity: ADL    Estimated energy needs: 2000  calories 225 g carbohydrates 150 g protein 56 g fat  Progress Towards Goal(s):  In progress.   Nutritional Diagnosis:  NB-1.1 Food and nutrition-related knowledge deficit As related  to Diabetes.  As evidenced by A1C 9.5%.    Intervention:  Nutrition and Diabetes education provided on My Plate, CHO counting, meal planning, portion sizes, timing of meals, avoiding snacks between meals unless having a low blood sugar, target ranges for A1C and blood sugars, signs/symptoms and treatment of hyper/hypoglycemia, monitoring blood sugars, taking medications as prescribed, benefits of exercising 30 minutes per day and prevention of complications of DM.   Goals .Keep up the great job! Eat three meals per day Continue to eat fresh fruits and vegetables Increase protein as desired with meals Drink fluids Eat 60-75 grams of carbs per meal  Teaching Method Utilized:  Visual Auditory Hands on  Handouts given during visit include:  The Plate Method   Meal Plan Card   Barriers to learning/adherence to lifestyle change: none  Demonstrated degree of understanding via:  Teach Back   Monitoring/Evaluation:  Dietary intake, exercise, meal planning, SBG, and body weight in 3 month(s).

## 2017-05-06 NOTE — Progress Notes (Signed)
Endocrinology follow-up note       05/06/2017, 4:33 PM   Subjective:    Patient ID: George [name], male    DOB: 1945/02/03.  George [name] is being seen in follow-up for management of currently uncontrolled symptomatic diabetes requested by  Monico Blitz, MD.   Past Medical History:  Diagnosis Date  . Acute upper respiratory infections of unspecified site   . Cancer [facility])    Prostate  . Chest pain   . Chronic back pain    buldging disc and inflammatory arthritis  . Colitis, enteritis, and gastroenteritis of presumed infectious origin   . Confusion    occasionally short term  . Coronary artery disease   . Dermatophytosis of the body   . Diabetes mellitus without complication ([place])    takes Lantus and metformina and amaryl daily  . Diabetic neuropathy ([place])   . Dizziness   . Generalized osteoarthrosis, unspecified site   . GERD (gastroesophageal reflux disease)    takes Omeprazole daily  . Herpes zoster without mention of complication   . History of bronchitis many yrs ago  . HOH (hard of hearing)   . Joint pain   . Lumbago   . Other dyspnea and respiratory abnormality    with exertion  . Pleurisy without mention of effusion or current tuberculosis   . Prostate cancer ([place])   . Psoriasis   . Psoriasis and similar disorder   . Pure hypercholesterolemia    takes Atorvastatin daily  . Sinusitis    takes Allegra nightly and FLonase prn  . Unspecified essential hypertension    takes Amlodipine,Losartan,CLonidine,Lisinopril,and Metoprolol daily   Past Surgical History:  Procedure Laterality Date  . CATARACT EXTRACTION W/PHACO Left 04/04/2014   Procedure: CATARACT EXTRACTION PHACO AND INTRAOCULAR LENS PLACEMENT (IOC);  Surgeon: Williams Che, MD;  Location: [facility];  Service: Ophthalmology;  Laterality: Left;  George [name]:4.49  . CATARACT EXTRACTION W/PHACO Right 01/02/2015   Procedure: CATARACT  EXTRACTION PHACO AND INTRAOCULAR LENS PLACEMENT; George [name]:  6.36;  Surgeon: Williams Che, MD;  Location: [facility];  Service: Ophthalmology;  Laterality: Right;  . CIRCUMCISION  2010  . CORONARY ARTERY BYPASS GRAFT N/A 08/13/2012   Procedure: CORONARY ARTERY BYPASS GRAFTING (CABG);  Surgeon: Ivin Poot, MD;  Location: [place];  Service: Open Heart Surgery;  Laterality: N/A;  . ENDOVEIN HARVEST OF GREATER SAPHENOUS VEIN Right 08/13/2012   Procedure: ENDOVEIN HARVEST OF GREATER SAPHENOUS VEIN;  Surgeon: Ivin Poot, MD;  Location: [place];  Service: Open Heart Surgery;  Laterality: Right;  . INTRAOPERATIVE TRANSESOPHAGEAL ECHOCARDIOGRAM N/A 08/13/2012   Procedure: INTRAOPERATIVE TRANSESOPHAGEAL ECHOCARDIOGRAM;  Surgeon: Ivin Poot, MD;  Location: [place];  Service: Open Heart Surgery;  Laterality: N/A;  . LEFT HEART CATHETERIZATION WITH CORONARY ANGIOGRAM N/A 08/07/2012   Procedure: LEFT HEART CATHETERIZATION WITH CORONARY ANGIOGRAM;  Surgeon: Peter M Martinique, MD;  Location: [facility] CATH LAB;  Service: Cardiovascular;  Laterality: N/A;   Social History   Socioeconomic History  . Marital status: Married    Spouse name: Not on file  . Number of children: Not on file  . Years  of education: Not on file  . Highest education level: Not on file  Occupational History    Comment: logger  Social Needs  . Financial resource strain: Not on file  . Food insecurity:    Worry: Not on file    Inability: Not on file  . Transportation needs:    Medical: Not on file    Non-medical: Not on file  Tobacco Use  . Smoking status: Former Smoker    Packs/day: 1.00    Years: 4.00    Pack years: 4.00    Types: Cigarettes    Start date: 03/04/1965    Last attempt to quit: 03/05/1967    Years since quitting: 50.2  . Smokeless tobacco: Never Used  Substance and Sexual Activity  . Alcohol use: No    Alcohol/week: 0.0 oz  . Drug use: No  . Sexual activity: Yes    Birth control/protection: None  Lifestyle  .  Physical activity:    Days per week: Not on file    Minutes per session: Not on file  . Stress: Not on file  Relationships  . Social connections:    Talks on phone: Not on file    Gets together: Not on file    Attends religious service: Not on file    Active member of club or organization: Not on file    Attends meetings of clubs or organizations: Not on file    Relationship status: Not on file  Other Topics Concern  . 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   Outpatient Encounter Medications as of 05/06/2017  Medication Sig  . amLODipine (NORVASC) 5 MG tablet Take 1 tablet by mouth daily.  Marland Kitchen atorvastatin (LIPITOR) 20 MG tablet Take 20 mg by mouth daily.  . Blood Glucose Monitoring Suppl (TRUE METRIX AIR GLUCOSE METER) w/Device KIT See admin instructions. Blood Glucose Monitoring Supplies (True Metrix Air Glucose Meter)  . cloNIDine (CATAPRES) 0.1 MG tablet Take 1 tablet by mouth daily.  . Continuous Blood Gluc Receiver (FREESTYLE LIBRE 14 DAY READER) DEVI 1 each by Does not apply route once for 1 dose.  . Continuous Blood Gluc Sensor (FREESTYLE LIBRE 14 DAY SENSOR) MISC Inject 1 each into the skin every 14 (fourteen) days. Use as directed.  Marland Kitchen GLOBAL EASE INJECT PEN NEEDLES 31G X 5 MM MISC 3 (three) times daily.  . insulin aspart (NOVOLOG FLEXPEN) 100 UNIT/ML FlexPen Inject 10-16 Units into the skin 3 (three) times daily before meals.  . Insulin Glargine (LANTUS SOLOSTAR) 100 UNIT/ML Solostar Pen Inject 60 Units into the skin at bedtime.  Marland Kitchen losartan (COZAAR) 100 MG tablet Take 1 tablet by mouth daily.  . metFORMIN (GLUCOPHAGE) 500 MG tablet Take 1 tablet (500 mg total) by mouth 2 (two) times daily with a meal.  . metoprolol succinate (TOPROL-XL) 50 MG 24 hr tablet Take 1 tablet (50 mg total) by mouth daily. Take with or immediately following a meal.  . nitroGLYCERIN (NITROSTAT) 0.4 MG SL tablet PLACE 1 TABLET UNDER TONGUE EVERY 5 MINUTES  UP TO 3 DOSES, IF NO RELIEF CALL 911 -- EMERGENCY REFILL FAXED DR (Patient not taking: Reported on 02/26/2017)  . NON FORMULARY True Metrix Strips (DiabeticClub50)   No facility-administered encounter medications on file as of 05/06/2017.     ALLERGIES: No Known Allergies  VACCINATION STATUS:  There is no immunization history on file for this patient.  Diabetes  He presents for his follow-up  diabetic visit. He has type 2 diabetes mellitus. Onset time: He was diagnosed at approximate age of 41 years. His disease course has been worsening. There are no hypoglycemic associated symptoms. Pertinent negatives for hypoglycemia include no confusion, headaches, pallor or seizures. Associated symptoms include blurred vision, polydipsia and polyuria. Pertinent negatives for diabetes include no chest pain, no fatigue, no polyphagia and no weakness. There are no hypoglycemic complications. Symptoms are worsening. Diabetic complications include heart disease. Risk factors for coronary artery disease include dyslipidemia, diabetes mellitus, family history, hypertension, male sex, sedentary lifestyle and tobacco exposure. Current diabetic treatment includes insulin injections and oral agent (monotherapy). His weight is stable. He is following a generally unhealthy diet. When asked about meal planning, he reported none. He has had a previous visit with a dietitian. He never participates in exercise. His breakfast blood glucose range is generally 180-200 mg/dl. His lunch blood glucose range is generally 180-200 mg/dl. His dinner blood glucose range is generally 180-200 mg/dl. His bedtime blood glucose range is generally 180-200 mg/dl. His overall blood glucose range is 180-200 mg/dl. (He came with controlled fasting blood glucose profile, however significantly above target postprandial blood glucose profile.  His recent A1c was 9.5% on January 15, 2017.) An ACE inhibitor/angiotensin II receptor blocker is being taken. He  does not see a podiatrist.Eye exam is current.  Hyperlipidemia  This is a chronic problem. The current episode started more than 1 year ago. Exacerbating diseases include diabetes. Pertinent negatives include no chest pain, myalgias or shortness of breath. Current antihyperlipidemic treatment includes statins. Risk factors for coronary artery disease include dyslipidemia, diabetes mellitus, family history, male sex, hypertension and a sedentary lifestyle.  Hypertension  This is a chronic problem. The current episode started more than 1 year ago. Associated symptoms include blurred vision. Pertinent negatives include no chest pain, headaches, neck pain, palpitations or shortness of breath. Risk factors for coronary artery disease include dyslipidemia, diabetes mellitus, family history, sedentary lifestyle and smoking/tobacco exposure. Hypertensive end-organ damage includes CAD/MI.      Review of Systems  Constitutional: Negative for chills, fatigue, fever and unexpected weight change.  HENT: Positive for hearing loss. Negative for dental problem, mouth sores and trouble swallowing.   Eyes: Positive for blurred vision. Negative for visual disturbance.  Respiratory: Negative for cough, choking, chest tightness, shortness of breath and wheezing.   Cardiovascular: Negative for chest pain, palpitations and leg swelling.  Gastrointestinal: Negative for abdominal distention, abdominal pain, constipation, diarrhea, nausea and vomiting.  Endocrine: Positive for polydipsia and polyuria. Negative for polyphagia.  Genitourinary: Negative for dysuria, flank pain, hematuria and urgency.  Musculoskeletal: Positive for gait problem. Negative for back pain, myalgias and neck pain.  Skin: Negative for pallor, rash and wound.  Neurological: Negative for seizures, syncope, weakness, numbness and headaches.  Psychiatric/Behavioral: Negative.  Negative for confusion and dysphoric mood.    Objective:    BP 130/80    Pulse 62   Ht '6\' 3"'$  (1.905 m)   Wt 213 lb (96.6 kg)   BMI 26.62 kg/m   Wt Readings from Last 3 Encounters:  05/06/17 213 lb (96.6 kg)  05/06/17 213 lb (96.6 kg)  05/01/17 212 lb 3.2 oz (96.3 kg)     Physical Exam  Constitutional: He is oriented to person, place, and time. He appears well-developed. He is cooperative. No distress.  HENT:  Head: Normocephalic and atraumatic.  Eyes: EOM are normal.  Neck: Normal range of motion. Neck supple. No tracheal deviation present. No thyromegaly present.  Cardiovascular: Normal rate, S1 normal and S2 normal. Exam reveals no gallop.  No murmur heard. Pulses:      Dorsalis pedis pulses are 0 on the right side, and 0 on the left side.       Posterior tibial pulses are 0 on the right side, and 0 on the left side.  Pulmonary/Chest: Effort normal and breath sounds normal. No respiratory distress. He has no wheezes.  Abdominal: Bowel sounds are normal. He exhibits no distension. There is no tenderness. There is no guarding and no CVA tenderness.  Musculoskeletal: He exhibits no edema.       Right shoulder: He exhibits no swelling and no deformity.  Neurological: He is alert and oriented to person, place, and time. He has normal strength and normal reflexes. No cranial nerve deficit or sensory deficit. Gait normal.  Hard of hearing  Skin: Skin is warm and dry. No rash noted. No cyanosis. Nails show no clubbing.  Psychiatric: He has a normal mood and affect. His speech is normal. Cognition and memory are normal.    CMP ( most recent) CMP     Component Value Date/Time   NA 135 12/27/2014 1542   K 4.2 12/27/2014 1542   CL 100 (L) 12/27/2014 1542   CO2 29 12/27/2014 1542   GLUCOSE 284 (H) 12/27/2014 1542   BUN 14 04/23/2017   CREATININE 0.8 04/23/2017   CREATININE 0.80 01/15/2017 1052   CREATININE 0.74 11/08/2013 1049   CALCIUM 9.2 12/27/2014 1542   PROT 6.9 08/11/2012 1104   ALBUMIN 3.8 08/11/2012 1104   AST 28 08/11/2012 1104   ALT 44  08/11/2012 1104   ALKPHOS 100 08/11/2012 1104   BILITOT 0.3 08/11/2012 1104   GFRNONAA >60 12/27/2014 1542   GFRAA >60 12/27/2014 1542     Diabetic Labs (most recent): Lab Results  Component Value Date   HGBA1C 8.2 04/23/2017   HGBA1C 9.5 01/15/2017   HGBA1C 7.8 (H) 08/11/2012   Lipid Panel     Component Value Date/Time   CHOL 103 04/23/2017   TRIG 123 04/23/2017   HDL 34 (A) 04/23/2017   LDLCALC 44 04/23/2017     Assessment & Plan:   1. Uncontrolled type 2 diabetes mellitus with hyperglycemia ([place])  - George [name] has currently uncontrolled symptomatic type 2 DM since 72 years of age. -He came with improved glycemic profile, still above target.  His A1c is 8.2% improving from 9.5%.     - Recent labs reviewed.  -his diabetes is complicated by coronary artery disease, peripheral arterial disease and George [name] remains at a high risk for more acute and chronic complications which include CAD, CVA, CKD, retinopathy, and neuropathy. These are all discussed in detail with the patient.  - I have counseled him on diet management and weight loss, by adopting a carbohydrate restricted/protein rich diet.  -  Suggestion is made for him to avoid simple carbohydrates  from his diet including Cakes, Sweet Desserts / Pastries, Ice Cream, Soda (diet and regular), Sweet Tea, Candies, Chips, Cookies, Store Bought Juices, Alcohol in Excess of  1-2 drinks a day, Artificial Sweeteners, and "Sugar-free" Products. This will help patient to have stable blood glucose profile and potentially avoid unintended weight gain.  - I encouraged him to switch to  unprocessed or minimally processed complex starch and increased protein intake (animal or plant source), fruits, and vegetables.  - he is advised to stick to a routine mealtimes to eat 3 meals  a  day and avoid unnecessary snacks ( to snack only to correct hypoglycemia).   - he is following with George [name], George [name], George [name] for individualized  diabetes education.  - I have approached him with the following individualized plan to manage diabetes and patient agrees:   -Given his current glycemic burden, he will continue to require basal/bolus insulin in order for him to achieve control of diabetes to target.   -His wife is continuing to offer help. - I advised him to increase his Lantus to 60 units nightly, continue  NovoLog 10-16 units for pre-meal blood glucose above 90 mg/dL, associated with strict  monitoring blood glucose 4 times a day- before meals and at bedtime.   - Patient is warned not to take insulin without proper monitoring per orders.  -Patient is encouraged to call clinic for blood glucose levels less than 70 or above 300 mg /dl. -He has tolerated low-dose metformin, I have advised him to continue metformin 500 mg p.o. twice daily after meals.  -Will benefit from continuous glucose monitoring.  I have initiated prescription for the [facility] device for him. - he will be considered for incretin therapy as appropriate next visit. - Patient specific target  A1c;  LDL, HDL, Triglycerides, and  Waist Circumference were discussed in detail.  2) BP/HTN: His blood pressure is controlled to target.   I advised him to continue his current medications including ACEI/ARB. 3) Lipids/HPL: His lipid panel showed controlled LDL at 44.     Patient is advised to continue statins.   4)  Weight/Diet: George [name] Consult has been initiated , exercise, and detailed carbohydrates information provided.  5) Chronic Care/Health Maintenance:  -he  is on ACEI/ARB and Statin medications and  is encouraged to continue to follow up with Ophthalmology, Dentist,  Podiatrist at least yearly or according to recommendations, and advised to  stay away from smoking. I have recommended yearly flu vaccine and pneumonia vaccination at least every 5 years; moderate intensity exercise for up to 150 minutes weekly; and  sleep for at least 7 hours a day.  - I  advised patient to maintain close follow up with Monico Blitz, MD for primary care needs.   - Time spent with the patient: 25 min, of which >50% was spent in reviewing his blood glucose logs , discussing his hypo- and hyper-glycemic episodes, reviewing his current and  previous labs and insulin doses and developing a plan to avoid hypo- and hyper-glycemia. Please refer to Patient Instructions for Blood Glucose Monitoring and Insulin/Medications Dosing Guide"  in media tab for additional information. George [name] participated in the discussions, expressed understanding, and voiced agreement with the above plans.  All questions were answered to his satisfaction. he is encouraged to contact clinic should he have any questions or concerns prior to his return visit.    Follow up plan: -He is asked to return in 4 months with repeat labs, meter and logs for reevaluation. Glade Lloyd, MD [facility] [address] [place], [place] [address] Phone: [phone]  Fax: [phone]    05/06/2017, 4:33 PM  This note was partially dictated with voice recognition software. Similar sounding words can be transcribed inadequately or may not  be corrected upon review.

## 2017-05-06 NOTE — Patient Instructions (Signed)

## 2017-05-06 NOTE — Telephone Encounter (Signed)
CALLED PATIENT TO INFORM OF SIM APPT. FOR 05-14-17 @ 10 AM , SPOKE WITH PATIENT'S WIFE- CARLETTA AND SHE IS AWARE OF THIS APPT.

## 2017-05-08 ENCOUNTER — Other Ambulatory Visit: Payer: Self-pay | Admitting: "Endocrinology

## 2017-05-09 NOTE — Telephone Encounter (Signed)
He didn't call this week.

## 2017-05-13 ENCOUNTER — Telehealth: Payer: Self-pay | Admitting: *Deleted

## 2017-05-13 ENCOUNTER — Encounter: Payer: Self-pay | Admitting: Urology

## 2017-05-13 NOTE — Telephone Encounter (Signed)
Called patient to inform of sim being cancelled for 05-14-17, due to fact that he hasn't had hormone inj., I informed his wife that I called Dr. Ralene Muskrat Office to arrange the inj. and I will call her as soon as I hear from them, patient's wife verified understanding this

## 2017-05-13 NOTE — Progress Notes (Signed)
Patient called 05/06/17 with decision to move forward with 5 weeks of prostate EBRT followed by brachytherapy boost here locally versus 8 weeks of prostate EBRT in [place], [place].  Therefore, I have sent a message to Romie Jumper to contact the patient to confirm if/when he has started ADT with Dr. Jeffie Pollock.  Once we have established the start date for ADT (to start ASAP), we can move forward with coordinating fiducial markers and SpaceO[facility] with Dr. Jeffie Pollock approximately 6 weeks after starting ADT followed by a CT SIM appointment thereafter, in anticipation of beginning 5 weeks of prostate IMRT after 8 weeks on ADT.  He will also need to be scheduled for brachytherapy boost procedure approximately 3 weeks after completion of prostate IMRT.   Nicholos Johns, PA-C

## 2017-05-14 ENCOUNTER — Ambulatory Visit: Payer: Medicare HMO | Admitting: Radiation Oncology

## 2017-05-14 ENCOUNTER — Encounter: Payer: Self-pay | Admitting: Nutrition

## 2017-05-19 ENCOUNTER — Encounter: Payer: Self-pay | Admitting: Medical Oncology

## 2017-05-19 ENCOUNTER — Telehealth: Payer: Self-pay | Admitting: *Deleted

## 2017-05-19 NOTE — Telephone Encounter (Signed)
CALLED PATIENT TO INFORM OF ADT INJ. FOR 05-23-17 - ARRIVAL TIME- 7:45 AM @ [facility] (DR. Ralene Muskrat OFFICE), SPOKE WITH PATIENT'S WIFE- CARLETTA AND SHE IS AWARE OF THIS APPT.

## 2017-05-27 NOTE — Progress Notes (Signed)
[facility] Note  05/28/2017     CHIEF COMPLAINT Patient presents for Retina Evaluation   HISTORY OF PRESENT ILLNESS: George [name] is a 72 y.o. male who presents to the clinic today for:   HPI    Retina Evaluation    Duration of 5 days.  Context:  distance vision, mid-range vision and near vision.  Treatments tried include no treatments.  I, the attending physician,  performed the HPI with the patient and updated documentation appropriately.          Comments    Pt presents for retinal evaluation on the referral of Dr. Radford Pax in [place], pt saw Dr. Radford Pax on May 10 for routine exam for glasses, pt had cat sx performed by Dr. Clovia Cuff over a year ago OU and states he was never given glasses after the sx, pt states he can tell his vision is getting worse, pt denies flashes, floaters, pain or wavy vision, pt is diabetic and uses Lantus and Metformin, pts BS was 156 this morning, pt also has prostate cancer and received 2 shots of either hormones or steroids last Friday, which made his sugar go up       Last edited by Bernarda Caffey, MD on 05/28/2017  2:51 PM. (History)    Pt states he saw Dr. Radford Pax and states "Dr. Radford Pax saw something and sent me here"; Pt states Dr. Iona Hansen performed cataract sx x 3 years ago; Pt states he has been having trouble driving, states OS VA is worse than OD; Pt endorses dx of DM, states DM is somewhat controlled;   Referring physician: Pa, Dr Raymond Gurney Optometrist [phone] [place], [place] [address]  HISTORICAL INFORMATION:   Selected notes from the MEDICAL RECORD NUMBER Referred by Dr. Rocky Link for retinal hemorrhage LEE: 05.10.19 (R. Turner) [BCVA: OD: 20/20 OS: 20/20] Ocular Hx-Pseudophakia PMH-DM(on Lantus and Metformin), HTN, Arthritis    CURRENT MEDICATIONS: No current outpatient medications on file. (Ophthalmic Drugs)   No current facility-administered medications for this visit.  (Ophthalmic Drugs)    Current Outpatient Medications (Other)  Medication Sig  . amLODipine (NORVASC) 5 MG tablet Take 1 tablet by mouth daily.  Marland Kitchen atorvastatin (LIPITOR) 20 MG tablet Take 20 mg by mouth daily.  . Blood Glucose Monitoring Suppl (TRUE METRIX AIR GLUCOSE METER) w/Device KIT See admin instructions. Blood Glucose Monitoring Supplies (True Metrix Air Glucose Meter)  . cloNIDine (CATAPRES) 0.1 MG tablet Take 1 tablet by mouth daily.  . Continuous Blood Gluc Sensor (FREESTYLE LIBRE 14 DAY SENSOR) MISC Inject 1 each into the skin every 14 (fourteen) days. Use as directed.  Marland Kitchen GLOBAL EASE INJECT PEN NEEDLES 31G X 5 MM MISC 3 (three) times daily.  . insulin aspart (NOVOLOG FLEXPEN) 100 UNIT/ML FlexPen Inject 10-16 Units into the skin 3 (three) times daily before meals.  . Insulin Glargine (LANTUS SOLOSTAR) 100 UNIT/ML Solostar Pen Inject 60 Units into the skin at bedtime.  Marland Kitchen losartan (COZAAR) 100 MG tablet Take 1 tablet by mouth daily.  . metFORMIN (GLUCOPHAGE) 500 MG tablet TAKE ONE TABLET BY MOUTH TWICE DAILY WITH A MEAL  . metoprolol succinate (TOPROL-XL) 50 MG 24 hr tablet Take 1 tablet (50 mg total) by mouth daily. Take with or immediately following a meal.  . nitroGLYCERIN (NITROSTAT) 0.4 MG SL tablet PLACE 1 TABLET UNDER TONGUE EVERY 5 MINUTES UP TO 3 DOSES, IF NO RELIEF CALL 911 -- EMERGENCY REFILL FAXED DR (  Patient not taking: Reported on 02/26/2017)  . NON FORMULARY True Metrix Strips (DiabeticClub50)   Current Facility-Administered Medications (Other)  Medication Route  . Bevacizumab (AVASTIN) SOLN 1.25 mg Intravitreal      REVIEW OF SYSTEMS: ROS    Positive for: Endocrine, Cardiovascular, Eyes   Negative for: Constitutional, Gastrointestinal, Neurological, Skin, Genitourinary, Musculoskeletal, HENT, Respiratory, Psychiatric, Allergic/Imm, Heme/Lymph   Last edited by Debbrah Alar, COT on 05/28/2017  1:10 PM. (History)       ALLERGIES No Known Allergies  PAST MEDICAL HISTORY Past  Medical History:  Diagnosis Date  . Acute upper respiratory infections of unspecified site   . Cancer [facility])    Prostate  . Chest pain   . Chronic back pain    buldging disc and inflammatory arthritis  . Colitis, enteritis, and gastroenteritis of presumed infectious origin   . Confusion    occasionally short term  . Coronary artery disease   . Dermatophytosis of the body   . Diabetes mellitus without complication ([place])    takes Lantus and metformina and amaryl daily  . Diabetic neuropathy ([place])   . Dizziness   . Generalized osteoarthrosis, unspecified site   . GERD (gastroesophageal reflux disease)    takes Omeprazole daily  . Herpes zoster without mention of complication   . History of bronchitis many yrs ago  . HOH (hard of hearing)   . Joint pain   . Lumbago   . Other dyspnea and respiratory abnormality    with exertion  . Pleurisy without mention of effusion or current tuberculosis   . Prostate cancer ([place])   . Psoriasis   . Psoriasis and similar disorder   . Pure hypercholesterolemia    takes Atorvastatin daily  . Sinusitis    takes Allegra nightly and FLonase prn  . Unspecified essential hypertension    takes Amlodipine,Losartan,CLonidine,Lisinopril,and Metoprolol daily   Past Surgical History:  Procedure Laterality Date  . CATARACT EXTRACTION W/PHACO Left 04/04/2014   Procedure: CATARACT EXTRACTION PHACO AND INTRAOCULAR LENS PLACEMENT (IOC);  Surgeon: Williams Che, MD;  Location: [facility];  Service: Ophthalmology;  Laterality: Left;  CDE:4.49  . CATARACT EXTRACTION W/PHACO Right 01/02/2015   Procedure: CATARACT EXTRACTION PHACO AND INTRAOCULAR LENS PLACEMENT; CDE:  6.36;  Surgeon: Williams Che, MD;  Location: [facility];  Service: Ophthalmology;  Laterality: Right;  . CIRCUMCISION  2010  . CORONARY ARTERY BYPASS GRAFT N/A 08/13/2012   Procedure: CORONARY ARTERY BYPASS GRAFTING (CABG);  Surgeon: Ivin Poot, MD;  Location: [place];  Service: Open Heart Surgery;   Laterality: N/A;  . ENDOVEIN HARVEST OF GREATER SAPHENOUS VEIN Right 08/13/2012   Procedure: ENDOVEIN HARVEST OF GREATER SAPHENOUS VEIN;  Surgeon: Ivin Poot, MD;  Location: [place];  Service: Open Heart Surgery;  Laterality: Right;  . INTRAOPERATIVE TRANSESOPHAGEAL ECHOCARDIOGRAM N/A 08/13/2012   Procedure: INTRAOPERATIVE TRANSESOPHAGEAL ECHOCARDIOGRAM;  Surgeon: Ivin Poot, MD;  Location: [place];  Service: Open Heart Surgery;  Laterality: N/A;  . LEFT HEART CATHETERIZATION WITH CORONARY ANGIOGRAM N/A 08/07/2012   Procedure: LEFT HEART CATHETERIZATION WITH CORONARY ANGIOGRAM;  Surgeon: Peter M Martinique, MD;  Location[facility] CATH LAB;  Service: Cardiovascular;  Laterality: N/A;    FAMILY HISTORY Family History  Problem Relation Age of Onset  . CAD Unknown   . Heart attack Unknown   . Diabetes Unknown   . Cataracts Mother   . Amblyopia Neg Hx   . Blindness Neg Hx   . Glaucoma Neg Hx   .  Macular degeneration Neg Hx   . Retinal detachment Neg Hx   . Strabismus Neg Hx   . Retinitis pigmentosa Neg Hx     SOCIAL HISTORY Social History   Tobacco Use  . Smoking status: Former Smoker    Packs/day: 1.00    Years: 4.00    Pack years: 4.00    Types: Cigarettes    Start date: 03/04/1965    Last attempt to quit: 03/05/1967    Years since quitting: 50.2  . Smokeless tobacco: Never Used  Substance Use Topics  . Alcohol use: No    Alcohol/week: 0.0 oz  . Drug use: No         OPHTHALMIC EXAM:  Base Eye Exam    Visual Acuity (Snellen - Linear)      Right Left   Dist [place] 20/50 -1 20/70 -2   Dist ph [place] 20/30 -2 20/30 -2       Tonometry (Tonopen, 1:23 PM)      Right Left   Pressure 17 19       Pupils      Dark Light Shape React APD   Right 5 3 Round Brisk None   Left 5 3 Round Brisk None       Visual Fields (Counting fingers)      Left Right    Full Full       Extraocular Movement      Right Left    Full, Ortho Full, Ortho       Neuro/Psych    Oriented x3:  Yes    Mood/Affect:  Normal       Dilation    Both eyes:  1.0% Mydriacyl, 2.5% Phenylephrine @ 1:23 PM        Slit Lamp and Fundus Exam    Slit Lamp Exam      Right Left   Lids/Lashes Dermatochalasis - upper lid Dermatochalasis - upper lid   Conjunctiva/Sclera Temporal and Nasal Pinguecula Temporal and Nasal Pinguecula   Cornea Trace Inferior Punctate epithelial erosions, Arcus, superior cataract wounds with refractile deposites Arcus, superior cataract wounds well healed   Anterior Chamber Deep and quiet, no cell or flare Deep and quiet, no cell or flare   Iris Round and dilated, No NVI Round and dilated, No NVI   Lens PC IOL in good position, 1+ central Posterior capsular opacification PC IOL in good position, 1+ inferior Posterior capsular opacification to visual axis   Vitreous Vitreous syneresis, Posterior vitreous detachment Vitreous syneresis, Posterior vitreous detachment       Fundus Exam      Right Left   Disc peripapillary CNV with surrounding hemorrhage nasal to disc, Temporal Peripapillary atrophy Temporal Peripapillary atrophy   C/D Ratio 0.4 0.5   Macula Flat, Blunted foveal reflex, Drusen, RPE mottling and clumping, No heme or edema Flat, Blunted foveal reflex, RPE mottling and clumping, Drusen   Vessels Vascular attenuation, AV crossing changes Mild Vascular attenuation, mild AV crossing changes   Periphery Attached Attached, irregular pigmented lesion along ST arcade about 1.5DD area - flat        Refraction    Manifest Refraction      Sphere Cylinder Axis Dist VA   Right +0.50 Sphere  20/50-1   Left -1.00 +2.00 015 20/40+2          IMAGING AND PROCEDURES  Imaging and Procedures for _0 @  OCT, Retina - OU - Both Eyes       Right Eye Quality was  good. Central Foveal Thickness: 298. Progression has no prior data. Findings include normal foveal contour, retinal drusen , outer retinal atrophy, pigment epithelial detachment, intraretinal fluid, subretinal  fluid (peripapillary SRHM nasal to disc with overlying IRF/SRF).   Left Eye Quality was good. Central Foveal Thickness: 292. Progression has no prior data. Findings include normal foveal contour, no IRF, no SRF, retinal drusen , pigment epithelial detachment, outer retinal atrophy.   Notes *Images captured and stored on drive  Diagnosis / Impression:  Exudative ARMD OD -- peripapillary CNVM with +SRF/IRF Nonexudative ARMD OS   Clinical management:  See below  Abbreviations: NFP - Normal foveal profile. CME - cystoid macular edema. PED - pigment epithelial detachment. IRF - intraretinal fluid. SRF - subretinal fluid. EZ - ellipsoid zone. ERM - epiretinal membrane. ORA - outer retinal atrophy. ORT - outer retinal tubulation. SRHM - subretinal hyper-reflective material         Intravitreal Injection, Pharmacologic Agent - OD - Right Eye       Time Out 05/28/2017. 3:20 PM. Confirmed correct patient, procedure, site, and patient consented.   Anesthesia Topical anesthesia was used. Anesthetic medications included Lidocaine 2%, Tetracaine 0.5%.   Procedure Preparation included 5% betadine to ocular surface. A supplied needle was used.   Injection: 1.25 mg Bevacizumab 1.74m/0.05ml   NDC: 540981-191-47   Lot: [id number]     Expiration Date: 07/09/2017   Route: Intravitreal   Site: Right Eye   Waste: 0 mg  Post-op Post injection exam found visual acuity of at least counting fingers. The patient tolerated the procedure well. There were no complications. The patient received written and verbal post procedure care education.                 ASSESSMENT/PLAN:    ICD-10-CM   1. Exudative age-related macular degeneration of right eye with active choroidal neovascularization (HCC) H35.3211 Intravitreal Injection, Pharmacologic Agent - OD - Right Eye    Bevacizumab (AVASTIN) SOLN 1.25 mg  2. Retinal edema H35.81 OCT, Retina - OU - Both Eyes  3. Intermediate stage nonexudative  age-related macular degeneration of left eye H35.3122   4. 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 Posterior vitreous detachment of both eyes H43.813   6. Pseudophakia of both eyes Z96.1   7. PCO (posterior capsular opacification), bilateral H26.493     1, 2. Exudative age related macular degeneration, OD  - peripapillary CNVM with +subretinal heme and edema, nasal disc   - The incidence pathology and anatomy of wet AMD discussed   - The ANCHOR, MARINA, CATT and VIEW trials discussed with patient.    - OCT shows drusen and low-lying PED without IRF/SRF in macula; but peripapillary IRF/SRF overlying PED/SRHM corresponding to subretinal heme OD  - discussed treatment options including observation vs intravitreal anti-VEGF agents such as Avastin, Lucentis, Eylea.    - Risks of endophthalmitis and vascular occlusive events and atrophic changes discussed with patient  - recommend IVA OD today (05.15.19)  - pt wishes to be treated with IVA  - RBA of procedure discussed, questions answered  - informed consent obtained and signed  - see procedure note  3. Age related macular degeneration, non-exudative, OS  - The incidence, anatomy, and pathology of dry AMD, risk of progression, and the AREDS and AREDS 2 study including smoking risks discussed with patient.  - Recommend amsler grid monitoring  4. Choroidal nevus OS- - flat pigmented lesion along sup temp arcade - no suspcious features --  low concern for choroidal melanoma - monitor  5. PVD / vitreous syneresis OU  Discussed findings and prognosis  No RT or RD on 360 exam  Reviewed s/s of RT/RD  Strict return precautions for any such RT/RD signs/symptoms  6. Pseudophakia OU  - s/p CE/IOL OU by Dr. Iona Hansen in 2016  - doing well  - monitor  7. PCO OU -- OD>OS - reports significant glare symptoms and blurring OD - 1-2+ central PCO OD - discussed findings, prognosis and treatment options - pt wishes to undergo YAG cap -- will schedule  for 2 wks from today - F/U 2 weeks for YAG cap OD    Ophthalmic Meds Ordered this visit:  Meds ordered this encounter  Medications  . Bevacizumab (AVASTIN) SOLN 1.25 mg       Return in about 2 weeks (around 06/11/2017) for OCT, Laser -- YAG Cap OD.  There are no Patient Instructions on file for this visit.   Explained the diagnoses, plan, and follow up with the patient and they expressed understanding.  Patient expressed understanding of the importance of proper follow up care.   This document serves as a record of services personally performed by Gardiner Sleeper, MD, PhD. It was created on their behalf by Ernest Mallick, OA, an ophthalmic assistant. The creation of this record is the provider's dictation and/or activities during the visit.    Electronically signed by: Ernest Mallick, OA  05/27/2017 3:42 PM    Gardiner Sleeper, M.D., Ph.D. Diseases & Surgery of the Retina and Vitreous [facility] 05/28/17   I have reviewed the above documentation for accuracy and completeness, and I agree with the above. Gardiner Sleeper, M.D., Ph.D. 05/28/17 3:48 PM     Abbreviations: M myopia (nearsighted); A astigmatism; H hyperopia (farsighted); P presbyopia; Mrx spectacle prescription;  CTL contact lenses; OD right eye; OS left eye; OU both eyes  XT exotropia; ET esotropia; PEK punctate epithelial keratitis; PEE punctate epithelial erosions; DES dry eye syndrome; MGD meibomian gland dysfunction; ATs artificial tears; PFAT's preservative free artificial tears; [place] nuclear sclerotic cataract; PSC posterior subcapsular cataract; ERM epi-retinal membrane; PVD posterior vitreous detachment; RD retinal detachment; DM diabetes mellitus; DR diabetic retinopathy; NPDR non-proliferative diabetic retinopathy; PDR proliferative diabetic retinopathy; CSME clinically significant macular edema; DME diabetic macular edema; dbh dot blot hemorrhages; CWS cotton wool spot; POAG primary open  angle glaucoma; C/D cup-to-disc ratio; HVF humphrey visual field; GVF goldmann visual field; OCT optical coherence tomography; IOP intraocular pressure; BRVO Branch retinal vein occlusion; CRVO central retinal vein occlusion; CRAO central retinal artery occlusion; BRAO branch retinal artery occlusion; RT retinal tear; SB scleral buckle; PPV pars plana vitrectomy; VH Vitreous hemorrhage; PRP panretinal laser photocoagulation; IVK intravitreal kenalog; VMT vitreomacular traction; MH Macular hole;  NVD neovascularization of the disc; NVE neovascularization elsewhere; AREDS age related eye disease study; ARMD age related macular degeneration; POAG primary open angle glaucoma; EBMD epithelial/anterior basement membrane dystrophy; ACIOL anterior chamber intraocular lens; IOL intraocular lens; PCIOL posterior chamber intraocular lens; Phaco/IOL phacoemulsification with intraocular lens placement; [place] photorefractive keratectomy; LASIK laser assisted in situ keratomileusis; HTN hypertension; DM diabetes mellitus; COPD chronic obstructive pulmonary disease

## 2017-05-28 ENCOUNTER — Ambulatory Visit (INDEPENDENT_AMBULATORY_CARE_PROVIDER_SITE_OTHER): Payer: Medicare HMO | Admitting: Ophthalmology

## 2017-05-28 ENCOUNTER — Encounter (INDEPENDENT_AMBULATORY_CARE_PROVIDER_SITE_OTHER): Payer: Self-pay | Admitting: Ophthalmology

## 2017-05-28 DIAGNOSIS — H3581 Retinal edema: Secondary | ICD-10-CM

## 2017-05-28 DIAGNOSIS — H353122 Nonexudative age-related macular degeneration, left eye, intermediate dry stage: Secondary | ICD-10-CM | POA: Diagnosis not present

## 2017-05-28 DIAGNOSIS — H43813 Vitreous degeneration, bilateral: Secondary | ICD-10-CM

## 2017-05-28 DIAGNOSIS — H353211 Exudative age-related macular degeneration, right eye, with active choroidal neovascularization: Secondary | ICD-10-CM

## 2017-05-28 DIAGNOSIS — D3132 Benign neoplasm of left choroid: Secondary | ICD-10-CM

## 2017-05-28 DIAGNOSIS — Z961 Presence of intraocular lens: Secondary | ICD-10-CM

## 2017-05-28 DIAGNOSIS — H26493 Other secondary cataract, bilateral: Secondary | ICD-10-CM | POA: Diagnosis not present

## 2017-05-28 MED ORDER — BEVACIZUMAB CHEMO INJECTION 1.25MG/0.05ML SYRINGE FOR KALEIDOSCOPE
1.2500 mg | INTRAVITREAL | Status: DC
  Administered 2017-05-28: 1.25 mg via INTRAVITREAL

## 2017-06-05 ENCOUNTER — Other Ambulatory Visit: Payer: Self-pay | Admitting: "Endocrinology

## 2017-06-10 NOTE — Progress Notes (Signed)
[facility] Note  06/11/2017     CHIEF COMPLAINT Patient presents for Retina Follow Up   HISTORY OF PRESENT ILLNESS: George [name] is a 72 y.o. male who presents to the clinic today for:   HPI    Retina Follow Up    Patient presents with  Wet AMD.  In right eye.  Severity is moderate.  Since onset it is stable.  I, the attending physician,  performed the HPI with the patient and updated documentation appropriately.          Comments    72 y/o male pt here for 2 wk f/u for PCO OD>OS, wet ARMD OD, and dry ARMD OS.  No change in vision ou since last visit.  Denies pain, flashes, floaters.  Glucose 185 this a.m.  Last A1C was in the 8s about 6 wks ago.       Last edited by Bernarda Caffey, MD on 06/11/2017  4:02 PM. (History)      Referring physician: Monico Blitz, MD [place], [place] [id number]  HISTORICAL INFORMATION:   Selected notes from the MEDICAL RECORD NUMBER Referred by Dr. Rocky Link for retinal hemorrhage LEE: 05.10.19 (R. Turner) [BCVA: OD: 20/20 OS: 20/20] Ocular Hx-Pseudophakia PMH-DM(on Lantus and Metformin), HTN, Arthritis    CURRENT MEDICATIONS: Current Outpatient Medications (Ophthalmic Drugs)  Medication Sig  . prednisoLONE acetate (PRED FORTE) 1 % ophthalmic suspension Place 1 drop into the right eye 4 (four) times daily for 7 days.   No current facility-administered medications for this visit.  (Ophthalmic Drugs)   Current Outpatient Medications (Other)  Medication Sig  . amLODipine (NORVASC) 5 MG tablet Take 1 tablet by mouth daily.  Marland Kitchen atorvastatin (LIPITOR) 20 MG tablet Take 20 mg by mouth daily.  . Blood Glucose Monitoring Suppl (TRUE METRIX AIR GLUCOSE METER) w/Device KIT See admin instructions. Blood Glucose Monitoring Supplies (True Metrix Air Glucose Meter)  . cloNIDine (CATAPRES) 0.1 MG tablet Take 1 tablet by mouth daily.  . Continuous Blood Gluc Sensor (FREESTYLE LIBRE 14 DAY SENSOR) MISC Inject 1 each into  the skin every 14 (fourteen) days. Use as directed.  Marland Kitchen GLOBAL EASE INJECT PEN NEEDLES 31G X 5 MM MISC 3 (three) times daily.  . Insulin Glargine (LANTUS SOLOSTAR) 100 UNIT/ML Solostar Pen Inject 60 Units into the skin at bedtime.  Marland Kitchen losartan (COZAAR) 100 MG tablet Take 1 tablet by mouth daily.  . metFORMIN (GLUCOPHAGE) 500 MG tablet TAKE ONE TABLET BY MOUTH TWICE DAILY WITH A MEAL  . metoprolol succinate (TOPROL-XL) 50 MG 24 hr tablet Take 1 tablet (50 mg total) by mouth daily. Take with or immediately following a meal.  . nitroGLYCERIN (NITROSTAT) 0.4 MG SL tablet PLACE 1 TABLET UNDER TONGUE EVERY 5 MINUTES UP TO 3 DOSES, IF NO RELIEF CALL 911 -- EMERGENCY REFILL FAXED DR (Patient not taking: Reported on 02/26/2017)  . NON FORMULARY True Metrix Strips (DiabeticClub50)  . NOVOLOG FLEXPEN 100 UNIT/ML FlexPen INJECT 10-16 UNITS INTO THE SKIN THREE TIMES DAILY BEFORE MEALS   Current Facility-Administered Medications (Other)  Medication Route  . Bevacizumab (AVASTIN) SOLN 1.25 mg Intravitreal      REVIEW OF SYSTEMS: ROS    Positive for: Endocrine, Eyes   Negative for: Constitutional, Gastrointestinal, Neurological, Skin, Genitourinary, Musculoskeletal, HENT, Cardiovascular, Respiratory, Psychiatric, Allergic/Imm, Heme/Lymph   Last edited by Matthew Folks, COA on 06/11/2017  2:24 PM. (History)       ALLERGIES No Known Allergies  PAST MEDICAL HISTORY Past Medical History:  Diagnosis Date  . Acute upper respiratory infections of unspecified site   . Cancer [facility])    Prostate  . Chest pain   . Chronic back pain    buldging disc and inflammatory arthritis  . Colitis, enteritis, and gastroenteritis of presumed infectious origin   . Confusion    occasionally short term  . Coronary artery disease   . Dermatophytosis of the body   . Diabetes mellitus without complication ([place])    takes Lantus and metformina and amaryl daily  . Diabetic neuropathy ([place])   . Dizziness   . Generalized  osteoarthrosis, unspecified site   . GERD (gastroesophageal reflux disease)    takes Omeprazole daily  . Herpes zoster without mention of complication   . History of bronchitis many yrs ago  . HOH (hard of hearing)   . Joint pain   . Lumbago   . Other dyspnea and respiratory abnormality    with exertion  . Pleurisy without mention of effusion or current tuberculosis   . Prostate cancer ([place])   . Psoriasis   . Psoriasis and similar disorder   . Pure hypercholesterolemia    takes Atorvastatin daily  . Sinusitis    takes Allegra nightly and FLonase prn  . Unspecified essential hypertension    takes Amlodipine,Losartan,CLonidine,Lisinopril,and Metoprolol daily   Past Surgical History:  Procedure Laterality Date  . CATARACT EXTRACTION W/PHACO Left 04/04/2014   Procedure: CATARACT EXTRACTION PHACO AND INTRAOCULAR LENS PLACEMENT (IOC);  Surgeon: Williams Che, MD;  Location: [facility];  Service: Ophthalmology;  Laterality: Left;  CDE:4.49  . CATARACT EXTRACTION W/PHACO Right 01/02/2015   Procedure: CATARACT EXTRACTION PHACO AND INTRAOCULAR LENS PLACEMENT; CDE:  6.36;  Surgeon: Williams Che, MD;  Location: [facility];  Service: Ophthalmology;  Laterality: Right;  . CIRCUMCISION  2010  . CORONARY ARTERY BYPASS GRAFT N/A 08/13/2012   Procedure: CORONARY ARTERY BYPASS GRAFTING (CABG);  Surgeon: Ivin Poot, MD;  Location: [facility];  Service: Open Heart Surgery;  Laterality: N/A;  . ENDOVEIN HARVEST OF GREATER SAPHENOUS VEIN Right 08/13/2012   Procedure: ENDOVEIN HARVEST OF GREATER SAPHENOUS VEIN;  Surgeon: Ivin Poot, MD;  Location: [place];  Service: Open Heart Surgery;  Laterality: Right;  . INTRAOPERATIVE TRANSESOPHAGEAL ECHOCARDIOGRAM N/A 08/13/2012   Procedure: INTRAOPERATIVE TRANSESOPHAGEAL ECHOCARDIOGRAM;  Surgeon: Ivin Poot, MD;  Location: [place];  Service: Open Heart Surgery;  Laterality: N/A;  . LEFT HEART CATHETERIZATION WITH CORONARY ANGIOGRAM N/A 08/07/2012   Procedure: LEFT  HEART CATHETERIZATION WITH CORONARY ANGIOGRAM;  Surgeon: Peter M Martinique, MD;  Location: [facility] CATH LAB;  Service: Cardiovascular;  Laterality: N/A;    FAMILY HISTORY Family History  Problem Relation Age of Onset  . CAD Unknown   . Heart attack Unknown   . Diabetes Unknown   . Cataracts Mother   . Amblyopia Neg Hx   . Blindness Neg Hx   . Glaucoma Neg Hx   . Macular degeneration Neg Hx   . Retinal detachment Neg Hx   . Strabismus Neg Hx   . Retinitis pigmentosa Neg Hx     SOCIAL HISTORY Social History   Tobacco Use  . Smoking status: Former Smoker    Packs/day: 1.00    Years: 4.00    Pack years: 4.00    Types: Cigarettes    Start date: 03/04/1965    Last attempt to quit: 03/05/1967    Years since quitting: 50.3  . Smokeless tobacco: Never  Used  Substance Use Topics  . Alcohol use: No    Alcohol/week: 0.0 oz  . Drug use: No         OPHTHALMIC EXAM:  Base Eye Exam    Visual Acuity (Snellen - Linear)      Right Left   Dist [place] 20/50 -2 20/50 -2   Dist ph [place] 20/40 20/40 -       Tonometry (Tonopen, 2:30 PM)      Right Left   Pressure 17 17       Pupils      Dark Light Shape React APD   Right 5 3 Round Brisk None   Left 5 3 Round Brisk None       Visual Fields (Counting fingers)      Left Right    Full Full       Extraocular Movement      Right Left    Full, Ortho Full, Ortho       Neuro/Psych    Oriented x3:  Yes   Mood/Affect:  Normal       Dilation    Both eyes:  1.0% Mydriacyl, 2.5% Phenylephrine @ 2:30 PM        Slit Lamp and Fundus Exam    Slit Lamp Exam      Right Left   Lids/Lashes Dermatochalasis - upper lid Dermatochalasis - upper lid   Conjunctiva/Sclera Temporal and Nasal Pinguecula Temporal and Nasal Pinguecula   Cornea Trace Inferior Punctate epithelial erosions, Arcus, superior cataract wounds with refractile deposites Arcus, superior cataract wounds well healed   Anterior Chamber Deep and quiet, no cell or flare Deep and quiet,  no cell or flare   Iris Round and dilated, No NVI Round and dilated, No NVI   Lens PC IOL in good position, 1+ central Posterior capsular opacification PC IOL in good position, 1+ inferior Posterior capsular opacification to visual axis   Vitreous Vitreous syneresis, Posterior vitreous detachment Vitreous syneresis, Posterior vitreous detachment       Fundus Exam      Right Left   Disc peripapillary CNV with surrounding hemorrhage nasal to disc from 0200 to 0600, 360 Peripapillary atrophy Temporal Peripapillary atrophy   C/D Ratio 0.4 0.5   Macula Flat, Blunted foveal reflex, Drusen, RPE mottling and clumping, No heme or edema Flat, Blunted foveal reflex, RPE mottling and clumping, Drusen   Vessels Vascular attenuation, AV crossing changes Mild Vascular attenuation, mild AV crossing changes   Periphery Attached Attached, irregular pigmented lesion along ST arcade about 1.5DD area - flat          IMAGING AND PROCEDURES  Imaging and Procedures for _0 @  OCT, Retina - OU - Both Eyes       Right Eye Quality was good. Central Foveal Thickness: 300. Progression has been stable. Findings include normal foveal contour, retinal drusen , outer retinal atrophy, pigment epithelial detachment, intraretinal fluid, subretinal fluid (peripapillary SRHM nasal to disc with overlying IRF/SRF).   Left Eye Quality was good. Central Foveal Thickness: 296. Progression has been stable. Findings include normal foveal contour, no IRF, no SRF, retinal drusen , pigment epithelial detachment, outer retinal atrophy.   Notes *Images captured and stored on drive  Diagnosis / Impression:  Exudative ARMD OD -- peripapillary CNVM with +SRF/IRF -- mild interval improvement Nonexudative ARMD OS   Clinical management:  See below  Abbreviations: NFP - Normal foveal profile. CME - cystoid macular edema. PED - pigment epithelial detachment. IRF -  intraretinal fluid. SRF - subretinal fluid. EZ - ellipsoid zone.  ERM - epiretinal membrane. ORA - outer retinal atrophy. ORT - outer retinal tubulation. SRHM - subretinal hyper-reflective material         Yag Capsulotomy - OD - Right Eye       Procedure note: YAG Capsulotomy, RIGHT Eye  Informed consent obtained. Pre-op dilating drops (1% Topicamide and 2.5% Phenylephrine), and topical anesthesia given. Power: 6.0 mJ Shots: 24 Posterior capsulotomy in cruciate formation performed without difficulty. Patient tolerated procedure well. No complications. Rx pred forte 4 times a day for 7 days, then stop. Pt received written and verbal post laser education. Recheck in 1-2 weeks w/ dilated exam.                 ASSESSMENT/PLAN:    ICD-10-CM   1. PCO (posterior capsular opacification), bilateral H26.493 Yag Capsulotomy - OD - Right Eye  2. Exudative age-related macular degeneration of right eye with active choroidal neovascularization ([place]) H35.3211   3. Retinal edema H35.81 OCT, Retina - OU - Both Eyes  4. Intermediate stage nonexudative age-related macular degeneration of left eye H35.3122   5. 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 Posterior vitreous detachment of both eyes H43.813   7. Pseudophakia of both eyes Z96.1     1, 2. Exudative age related macular degeneration, OD  - peripapillary CNVM with +subretinal heme and edema, nasal disc   - S/P IVA OD #1 (05.15.19)  - OCT shows drusen and low-lying PED without IRF/SRF in macula; but peripapillary IRF/SRF overlying PED/SRHM corresponding to subretinal heme OD -- interval improvement  3. Age related macular degeneration, non-exudative, OS  - The incidence, anatomy, and pathology of dry AMD, risk of progression, and the AREDS and AREDS 2 study including smoking risks discussed with patient.  - continue amsler grid monitoring  4. Choroidal nevus OS- - flat pigmented lesion along sup temp arcade - no suspcious features -- low concern for choroidal melanoma - monitor  5. PVD /  vitreous syneresis OU  Discussed findings and prognosis  No RT or RD on 360 exam  Reviewed s/s of RT/RD  Strict return precautions for any such RT/RD signs/symptoms  6. Pseudophakia OU  - s/p CE/IOL OU by Dr. Iona Hansen in 2016  - doing well  - monitor  7. PCO OU -- OD>OS - reports significant glare symptoms and blurring OD - 1-2+ central PCO OD - discussed findings, prognosis and treatment options - pt wishes to undergo YAG cap today (05.29.19) - RBA of procedure discussed, questions answered - informed consent obtained and signed - see procedure note - F/U 2 weeks w/ DFE, No OCT    Ophthalmic Meds Ordered this visit:  Meds ordered this encounter  Medications  . prednisoLONE acetate (PRED FORTE) 1 % ophthalmic suspension    Sig: Place 1 drop into the right eye 4 (four) times daily for 7 days.    Dispense:  10 mL    Refill:  0       Return in about 2 weeks (around 06/25/2017) for PO Yag OD.  There are no Patient Instructions on file for this visit.   Explained the diagnoses, plan, and follow up with the patient and they expressed understanding.  Patient expressed understanding of the importance of proper follow up care.   This document serves as a record of services personally performed by Gardiner Sleeper, MD, PhD. It was created on their behalf by Catha Brow,  COA, a certified ophthalmic assistant. The creation of this record is the provider's dictation and/or activities during the visit.  Electronically signed by: Catha Brow, COA  05.28.19 12:13 PM    Gardiner Sleeper, M.D., Ph.D. Diseases & Surgery of the Retina and [place] 06/11/17  I have reviewed the above documentation for accuracy and completeness, and I agree with the above. Gardiner Sleeper, M.D., Ph.D. 06/12/17 12:15 PM    Abbreviations: M myopia (nearsighted); A astigmatism; H hyperopia (farsighted); P presbyopia; Mrx spectacle prescription;  CTL contact  lenses; OD right eye; OS left eye; OU both eyes  XT exotropia; ET esotropia; PEK punctate epithelial keratitis; PEE punctate epithelial erosions; DES dry eye syndrome; MGD meibomian gland dysfunction; ATs artificial tears; PFAT's preservative free artificial tears; [place] nuclear sclerotic cataract; PSC posterior subcapsular cataract; ERM epi-retinal membrane; PVD posterior vitreous detachment; RD retinal detachment; DM diabetes mellitus; DR diabetic retinopathy; NPDR non-proliferative diabetic retinopathy; PDR proliferative diabetic retinopathy; CSME clinically significant macular edema; DME diabetic macular edema; dbh dot blot hemorrhages; CWS cotton wool spot; POAG primary open angle glaucoma; C/D cup-to-disc ratio; HVF humphrey visual field; GVF goldmann visual field; OCT optical coherence tomography; IOP intraocular pressure; BRVO Branch retinal vein occlusion; CRVO central retinal vein occlusion; CRAO central retinal artery occlusion; BRAO branch retinal artery occlusion; RT retinal tear; SB scleral buckle; PPV pars plana vitrectomy; VH Vitreous hemorrhage; PRP panretinal laser photocoagulation; IVK intravitreal kenalog; VMT vitreomacular traction; MH Macular hole;  NVD neovascularization of the disc; NVE neovascularization elsewhere; AREDS age related eye disease study; ARMD age related macular degeneration; POAG primary open angle glaucoma; EBMD epithelial/anterior basement membrane dystrophy; ACIOL anterior chamber intraocular lens; IOL intraocular lens; PCIOL posterior chamber intraocular lens; Phaco/IOL phacoemulsification with intraocular lens placement; [place] photorefractive keratectomy; LASIK laser assisted in situ keratomileusis; HTN hypertension; DM diabetes mellitus; COPD chronic obstructive pulmonary disease

## 2017-06-11 ENCOUNTER — Ambulatory Visit (INDEPENDENT_AMBULATORY_CARE_PROVIDER_SITE_OTHER): Payer: Medicare HMO | Admitting: Ophthalmology

## 2017-06-11 ENCOUNTER — Encounter (INDEPENDENT_AMBULATORY_CARE_PROVIDER_SITE_OTHER): Payer: Self-pay | Admitting: Ophthalmology

## 2017-06-11 DIAGNOSIS — H3581 Retinal edema: Secondary | ICD-10-CM

## 2017-06-11 DIAGNOSIS — H26493 Other secondary cataract, bilateral: Secondary | ICD-10-CM

## 2017-06-11 DIAGNOSIS — D3132 Benign neoplasm of left choroid: Secondary | ICD-10-CM

## 2017-06-11 DIAGNOSIS — H43813 Vitreous degeneration, bilateral: Secondary | ICD-10-CM

## 2017-06-11 DIAGNOSIS — Z961 Presence of intraocular lens: Secondary | ICD-10-CM

## 2017-06-11 DIAGNOSIS — H353122 Nonexudative age-related macular degeneration, left eye, intermediate dry stage: Secondary | ICD-10-CM

## 2017-06-11 DIAGNOSIS — H353211 Exudative age-related macular degeneration, right eye, with active choroidal neovascularization: Secondary | ICD-10-CM

## 2017-06-11 MED ORDER — PREDNISOLONE ACETATE 1 % OP SUSP: 1.0000 [drp] | Freq: Four times a day (QID) | OPHTHALMIC | 0 refills | Status: AC

## 2017-06-17 DIAGNOSIS — C61 Malignant neoplasm of prostate: Secondary | ICD-10-CM | POA: Diagnosis not present

## 2017-06-19 ENCOUNTER — Telehealth: Payer: Self-pay | Admitting: Nutrition

## 2017-06-19 NOTE — Telephone Encounter (Signed)
TC from pt. He notes he got 2 shots a few weeks ago for his prostate cancer and ever since his BS have been much higher than usual. His Most recent readings:                BB      BL    BD   BED 6/1         173 236 196 292 6/2     212 260 240 330 6/3          282 220 250 273 6/4     183 174 246 325 6/5    174 176 315 234  Shared information of reading with DR. Dorris Fetch. Per  Dr. Dorris Fetch verbal orders, pt is to increase Lantus to 80 units a day at night, increase Novolog to 15 units before meals with sliding scale and stop the Metformin 500 mg BID for now. Pt. Verbalized understanding and read back verbal instructions.  Advised patient to call Dr. Dorris Fetch office if [facility] continue to exceed 200 before meals or bedtime 3 x in a row. He verbalized understanding.Marland Kitchen

## 2017-06-19 NOTE — Telephone Encounter (Signed)
Thank you Kieth Brightly for taking care of him.

## 2017-06-20 ENCOUNTER — Ambulatory Visit: Payer: Medicare HMO | Admitting: Urology

## 2017-06-20 DIAGNOSIS — C61 Malignant neoplasm of prostate: Secondary | ICD-10-CM

## 2017-06-25 ENCOUNTER — Encounter (INDEPENDENT_AMBULATORY_CARE_PROVIDER_SITE_OTHER): Payer: Medicare HMO | Admitting: Ophthalmology

## 2017-07-11 NOTE — Progress Notes (Signed)
[facility] Note  07/14/2017     CHIEF COMPLAINT Patient presents for Post-op Follow-up   HISTORY OF PRESENT ILLNESS: George [name] is a 72 y.o. male who presents to the clinic today for:   HPI    Post-op Follow-up    In right eye.  Discomfort includes none.  Negative for pain, itching, foreign body sensation, tearing, discharge and floaters.  Vision is improved.  I, the attending physician,  performed the HPI with the patient and updated documentation appropriately.          Comments    S/P YAG Cap OD (05.29.19); Pt states OD VA has improved since having YAG cap; Pt states OD is comfortable; Pt denies floaters, denies flashes, denies wavy VA; Pt reports he has not began any treatments for prostate cancer;        Last edited by Bernarda Caffey, MD on 07/14/2017 10:42 PM. (History)      Referring physician: Monico Blitz, MD [place], [place] [address]  HISTORICAL INFORMATION:   Selected notes from the MEDICAL RECORD NUMBER Referred by Dr. Rocky Link for retinal hemorrhage LEE: 05.10.19 (R. Turner) [BCVA: OD: 20/20 OS: 20/20] Ocular Hx-Pseudophakia PMH-DM(on Lantus and Metformin), HTN, Arthritis    CURRENT MEDICATIONS: No current outpatient medications on file. (Ophthalmic Drugs)   No current facility-administered medications for this visit.  (Ophthalmic Drugs)   Current Outpatient Medications (Other)  Medication Sig  . amLODipine (NORVASC) 5 MG tablet Take 1 tablet by mouth daily.  Marland Kitchen atorvastatin (LIPITOR) 20 MG tablet Take 20 mg by mouth daily.  . Blood Glucose Monitoring Suppl (TRUE METRIX AIR GLUCOSE METER) w/Device KIT See admin instructions. Blood Glucose Monitoring Supplies (True Metrix Air Glucose Meter)  . cloNIDine (CATAPRES) 0.1 MG tablet Take 1 tablet by mouth daily.  . Continuous Blood Gluc Sensor (FREESTYLE LIBRE 14 DAY SENSOR) MISC Inject 1 each into the skin every 14 (fourteen) days. Use as directed.  Marland Kitchen GLOBAL EASE INJECT  PEN NEEDLES 31G X 5 MM MISC 3 (three) times daily.  . Insulin Glargine (LANTUS SOLOSTAR) 100 UNIT/ML Solostar Pen Inject 60 Units into the skin at bedtime.  Marland Kitchen losartan (COZAAR) 100 MG tablet Take 1 tablet by mouth daily.  . metFORMIN (GLUCOPHAGE) 500 MG tablet TAKE ONE TABLET BY MOUTH TWICE DAILY WITH A MEAL  . metoprolol succinate (TOPROL-XL) 50 MG 24 hr tablet Take 1 tablet (50 mg total) by mouth daily. Take with or immediately following a meal.  . nitroGLYCERIN (NITROSTAT) 0.4 MG SL tablet PLACE 1 TABLET UNDER TONGUE EVERY 5 MINUTES UP TO 3 DOSES, IF NO RELIEF CALL 911 -- EMERGENCY REFILL FAXED DR (Patient not taking: Reported on 02/26/2017)  . NON FORMULARY True Metrix Strips (DiabeticClub50)  . NOVOLOG FLEXPEN 100 UNIT/ML FlexPen INJECT 10-16 UNITS INTO THE SKIN THREE TIMES DAILY BEFORE MEALS   Current Facility-Administered Medications (Other)  Medication Route  . Bevacizumab (AVASTIN) SOLN 1.25 mg Intravitreal  . Bevacizumab (AVASTIN) SOLN 1.25 mg Intravitreal      REVIEW OF SYSTEMS: ROS    Positive for: Skin, Musculoskeletal, HENT, Endocrine, Cardiovascular, Eyes   Negative for: Constitutional, Gastrointestinal, Neurological, Genitourinary, Respiratory, Psychiatric, Allergic/Imm, Heme/Lymph   Last edited by Cherrie Gauze, COA on 07/14/2017  1:16 PM. (History)       ALLERGIES No Known Allergies  PAST MEDICAL HISTORY Past Medical History:  Diagnosis Date  . Acute upper respiratory infections of unspecified site   . Cancer ([address])  Prostate  . Chest pain   . Chronic back pain    buldging disc and inflammatory arthritis  . Colitis, enteritis, and gastroenteritis of presumed infectious origin   . Confusion    occasionally short term  . Coronary artery disease   . Dermatophytosis of the body   . Diabetes mellitus without complication ([place])    takes Lantus and metformina and amaryl daily  . Diabetic neuropathy ([place])   . Dizziness   . Generalized osteoarthrosis,  unspecified site   . GERD (gastroesophageal reflux disease)    takes Omeprazole daily  . Herpes zoster without mention of complication   . History of bronchitis many yrs ago  . HOH (hard of hearing)   . Joint pain   . Lumbago   . Other dyspnea and respiratory abnormality    with exertion  . Pleurisy without mention of effusion or current tuberculosis   . Prostate cancer ([place])   . Psoriasis   . Psoriasis and similar disorder   . Pure hypercholesterolemia    takes Atorvastatin daily  . Sinusitis    takes Allegra nightly and FLonase prn  . Unspecified essential hypertension    takes Amlodipine,Losartan,CLonidine,Lisinopril,and Metoprolol daily   Past Surgical History:  Procedure Laterality Date  . CATARACT EXTRACTION W/PHACO Left 04/04/2014   Procedure: CATARACT EXTRACTION PHACO AND INTRAOCULAR LENS PLACEMENT (IOC);  Surgeon: Williams Che, MD;  Location: [facility];  Service: Ophthalmology;  Laterality: Left;  CDE:4.49  . CATARACT EXTRACTION W/PHACO Right 01/02/2015   Procedure: CATARACT EXTRACTION PHACO AND INTRAOCULAR LENS PLACEMENT; CDE:  6.36;  Surgeon: Williams Che, MD;  Location: [facility];  Service: Ophthalmology;  Laterality: Right;  . CIRCUMCISION  2010  . CORONARY ARTERY BYPASS GRAFT N/A 08/13/2012   Procedure: CORONARY ARTERY BYPASS GRAFTING (CABG);  Surgeon: Ivin Poot, MD;  Location: [facility];  Service: Open Heart Surgery;  Laterality: N/A;  . ENDOVEIN HARVEST OF GREATER SAPHENOUS VEIN Right 08/13/2012   Procedure: ENDOVEIN HARVEST OF GREATER SAPHENOUS VEIN;  Surgeon: Ivin Poot, MD;  Location: [facility];  Service: Open Heart Surgery;  Laterality: Right;  . INTRAOPERATIVE TRANSESOPHAGEAL ECHOCARDIOGRAM N/A 08/13/2012   Procedure: INTRAOPERATIVE TRANSESOPHAGEAL ECHOCARDIOGRAM;  Surgeon: Ivin Poot, MD;  Location: [facility];  Service: Open Heart Surgery;  Laterality: N/A;  . LEFT HEART CATHETERIZATION WITH CORONARY ANGIOGRAM N/A 08/07/2012   Procedure: LEFT HEART  CATHETERIZATION WITH CORONARY ANGIOGRAM;  Surgeon: Peter M Martinique, MD;  Location: [facility] CATH LAB;  Service: Cardiovascular;  Laterality: N/A;    FAMILY HISTORY Family History  Problem Relation Age of Onset  . CAD Unknown   . Heart attack Unknown   . Diabetes Unknown   . Cataracts Mother   . Amblyopia Neg Hx   . Blindness Neg Hx   . Glaucoma Neg Hx   . Macular degeneration Neg Hx   . Retinal detachment Neg Hx   . Strabismus Neg Hx   . Retinitis pigmentosa Neg Hx     SOCIAL HISTORY Social History   Tobacco Use  . Smoking status: Former Smoker    Packs/day: 1.00    Years: 4.00    Pack years: 4.00    Types: Cigarettes    Start date: 03/04/1965    Last attempt to quit: 03/05/1967    Years since quitting: 50.3  . Smokeless tobacco: Never Used  Substance Use Topics  . Alcohol use: No    Alcohol/week: 0.0 oz  . Drug use: No  OPHTHALMIC EXAM:  Base Eye Exam    Visual Acuity (Snellen - Linear)      Right Left   Dist [place] 20/30 20/60   Dist ph [place] 20/30 +2 20/50       Tonometry (Tonopen, 1:29 PM)      Right Left   Pressure 16 14       Pupils      Dark Light Shape React APD   Right 4 3 Round Brisk None   Left 4 3 Round Brisk None       Visual Fields (Counting fingers)      Left Right    Full Full       Extraocular Movement      Right Left    Full, Ortho Full, Ortho       Neuro/Psych    Oriented x3:  Yes   Mood/Affect:  Normal       Dilation    Both eyes:  1.0% Mydriacyl, 2.5% Phenylephrine @ 1:29 PM        Slit Lamp and Fundus Exam    Slit Lamp Exam      Right Left   Lids/Lashes Dermatochalasis - upper lid Dermatochalasis - upper lid   Conjunctiva/Sclera Temporal and Nasal Pinguecula Temporal and Nasal Pinguecula   Cornea 1+ Inferior Punctate epithelial erosions, Arcus, superior cataract wounds with refractile deposites Arcus, superior cataract wounds well healed   Anterior Chamber Deep and quiet, no cell or flare Deep and quiet, no cell or  flare   Iris Round and dilated, No NVI Round and dilated, No NVI   Lens PC IOL in good position, open PC PC IOL in good position, 1+ inferior Posterior capsular opacification to visual axis   Vitreous Vitreous syneresis, Posterior vitreous detachment Vitreous syneresis, Posterior vitreous detachment       Fundus Exam      Right Left   Disc peripapillary CNV with surrounding hemorrhage nasal to disc from 0200 to 0600, 360 Peripapillary atrophy Temporal Peripapillary atrophy   C/D Ratio 0.4 0.4   Macula Flat, Blunted foveal reflex, Drusen, RPE mottling and clumping, No heme or edema Flat, Blunted foveal reflex, RPE mottling and clumping, Drusen, No heme or edema   Vessels Vascular attenuation, AV crossing changes Mild Vascular attenuation, mild AV crossing changes   Periphery Attached Attached, irregular pigmented lesion along ST arcade about 1.5DD area - flat          IMAGING AND PROCEDURES  Imaging and Procedures for @TODAY @  OCT, Retina - OU - Both Eyes       Right Eye Quality was good. Central Foveal Thickness: 299. Progression has been stable. Findings include normal foveal contour, retinal drusen , outer retinal atrophy, pigment epithelial detachment, intraretinal fluid, subretinal fluid (peripapillary SRHM nasal to disc with overlying IRF/SRF --mild interval worsening).   Left Eye Quality was good. Central Foveal Thickness: 297. Progression has been stable. Findings include normal foveal contour, no IRF, no SRF, retinal drusen , pigment epithelial detachment, outer retinal atrophy.   Notes *Images captured and stored on drive  Diagnosis / Impression:  Exudative ARMD OD -- peripapillary CNVM with +SRF/IRF -- mild interval worsening Nonexudative ARMD OS   Clinical management:  See below  Abbreviations: NFP - Normal foveal profile. CME - cystoid macular edema. PED - pigment epithelial detachment. IRF - intraretinal fluid. SRF - subretinal fluid. EZ - ellipsoid zone. ERM -  epiretinal membrane. ORA - outer retinal atrophy. ORT - outer retinal tubulation. SRHM -  subretinal hyper-reflective material         Intravitreal Injection, Pharmacologic Agent - OD - Right Eye       Time Out 07/14/2017. 2:29 PM. Confirmed correct patient, procedure, site, and patient consented.   Anesthesia Topical anesthesia was used. Anesthetic medications included Lidocaine 2%, Tetracaine 0.5%.   Procedure Preparation included 5% betadine to ocular surface, eyelid speculum. A supplied needle was used.   Injection: 1.25 mg Bevacizumab 1.23m/0.05ml   NDC: 70360-001-02    Lot: [id number]     Expiration Date: 08/28/2017   Route: Intravitreal   Site: Right Eye   Waste: 0 mg  Post-op Post injection exam found visual acuity of at least counting fingers. The patient tolerated the procedure well. There were no complications. The patient received written and verbal post procedure care education.                 ASSESSMENT/PLAN:    ICD-10-CM   1. Exudative age-related macular degeneration of right eye with active choroidal neovascularization (HCC) H35.3211 OCT, Retina - OU - Both Eyes    Intravitreal Injection, Pharmacologic Agent - OD - Right Eye    Bevacizumab (AVASTIN) SOLN 1.25 mg    CANCELED: OCT, Retina - OU - Both Eyes  2. Retinal edema H35.81 OCT, Retina - OU - Both Eyes    CANCELED: OCT, Retina - OU - Both Eyes  3. Intermediate stage nonexudative age-related macular degeneration of left eye H35.3122   4. 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 Posterior vitreous detachment of both eyes H43.813   6. Pseudophakia of both eyes Z96.1   7. PCO (posterior capsular opacification), bilateral H26.493     1, 2. Exudative age related macular degeneration, OD  - peripapillary CNVM with +subretinal heme and edema, nasal disc   - S/P IVA OD #1 (05.15.19)  - OCT shows drusen and low-lying PED without IRF/SRF in macula; but peripapillary IRF/SRF overlying PED/SRHM  corresponding to subretinal heme OD -- interval increase in IRF/SRF - recommend IVA #2 OD today (07.01.19) - pt wishes to proceed - RBA of procedure discussed, questions answered - informed consent obtained and signed - see procedure note  3. Age related macular degeneration, non-exudative, OS  - The incidence, anatomy, and pathology of dry AMD, risk of progression, and the AREDS and AREDS 2 study including smoking risks discussed with patient.  - continue amsler grid monitoring  4. Choroidal nevus OS- - flat pigmented lesion along sup temp arcade - no suspcious features -- low concern for choroidal melanoma - monitor  5. PVD / vitreous syneresis OU  Discussed findings and prognosis  No RT or RD on 360 exam  Reviewed s/s of RT/RD  Strict return precautions for any such RT/RD signs/symptoms  6. Pseudophakia OU  - s/p CE/IOL OU by Dr. H20.01.19in 2016  - doing well  - monitor  7. PCO OU -- OD>OS - S/P YAG cap OD (05.29.19) - looks great with PC nicely open OD - OS not yet visually significant - monitor    Ophthalmic Meds Ordered this visit:  Meds ordered this encounter  Medications  . Bevacizumab (AVASTIN) SOLN 1.25 mg       Return in about 1 month (around 08/11/2017) for Dilated Exam, OCT.  There are no Patient Instructions on file for this visit.   Explained the diagnoses, plan, and follow up with the patient and they expressed understanding.  Patient expressed understanding of the importance of proper follow up care.  This document serves as a record of services personally performed by Gardiner Sleeper, MD, PhD. It was created on their behalf by Catha Brow, [place], a certified ophthalmic assistant. The creation of this record is the provider's dictation and/or activities during the visit.  Electronically signed by: Catha Brow, COA  06.28.19 10:45 PM    Gardiner Sleeper, M.D., Ph.D. Diseases & Surgery of the Retina and Vitreous [facility]  I have reviewed the above documentation for accuracy and completeness, and I agree with the above. Gardiner Sleeper, M.D., Ph.D. 07/14/17 10:45 PM     Abbreviations: M myopia (nearsighted); A astigmatism; H hyperopia (farsighted); P presbyopia; Mrx spectacle prescription;  CTL contact lenses; OD right eye; OS left eye; OU both eyes  XT exotropia; ET esotropia; PEK punctate epithelial keratitis; PEE punctate epithelial erosions; DES dry eye syndrome; MGD meibomian gland dysfunction; ATs artificial tears; PFAT's preservative free artificial tears; [place] nuclear sclerotic cataract; PSC posterior subcapsular cataract; ERM epi-retinal membrane; PVD posterior vitreous detachment; RD retinal detachment; DM diabetes mellitus; DR diabetic retinopathy; NPDR non-proliferative diabetic retinopathy; PDR proliferative diabetic retinopathy; CSME clinically significant macular edema; DME diabetic macular edema; dbh dot blot hemorrhages; CWS cotton wool spot; POAG primary open angle glaucoma; C/D cup-to-disc ratio; HVF humphrey visual field; GVF goldmann visual field; OCT optical coherence tomography; IOP intraocular pressure; BRVO Branch retinal vein occlusion; CRVO central retinal vein occlusion; CRAO central retinal artery occlusion; BRAO branch retinal artery occlusion; RT retinal tear; SB scleral buckle; PPV pars plana vitrectomy; VH Vitreous hemorrhage; PRP panretinal laser photocoagulation; IVK intravitreal kenalog; VMT vitreomacular traction; MH Macular hole;  NVD neovascularization of the disc; NVE neovascularization elsewhere; AREDS age related eye disease study; ARMD age related macular degeneration; POAG primary open angle glaucoma; EBMD epithelial/anterior basement membrane dystrophy; ACIOL anterior chamber intraocular lens; IOL intraocular lens; PCIOL posterior chamber intraocular lens; Phaco/IOL phacoemulsification with intraocular lens placement; [place] photorefractive keratectomy; LASIK laser assisted in  situ keratomileusis; HTN hypertension; DM diabetes mellitus; COPD chronic obstructive pulmonary disease

## 2017-07-14 ENCOUNTER — Encounter (INDEPENDENT_AMBULATORY_CARE_PROVIDER_SITE_OTHER): Payer: Self-pay | Admitting: Ophthalmology

## 2017-07-14 ENCOUNTER — Ambulatory Visit (INDEPENDENT_AMBULATORY_CARE_PROVIDER_SITE_OTHER): Payer: Medicare HMO | Admitting: Ophthalmology

## 2017-07-14 DIAGNOSIS — H43813 Vitreous degeneration, bilateral: Secondary | ICD-10-CM

## 2017-07-14 DIAGNOSIS — H3581 Retinal edema: Secondary | ICD-10-CM

## 2017-07-14 DIAGNOSIS — Z961 Presence of intraocular lens: Secondary | ICD-10-CM

## 2017-07-14 DIAGNOSIS — D3132 Benign neoplasm of left choroid: Secondary | ICD-10-CM

## 2017-07-14 DIAGNOSIS — H26493 Other secondary cataract, bilateral: Secondary | ICD-10-CM | POA: Diagnosis not present

## 2017-07-14 DIAGNOSIS — H353122 Nonexudative age-related macular degeneration, left eye, intermediate dry stage: Secondary | ICD-10-CM

## 2017-07-14 DIAGNOSIS — H353211 Exudative age-related macular degeneration, right eye, with active choroidal neovascularization: Secondary | ICD-10-CM | POA: Diagnosis not present

## 2017-07-14 MED ORDER — BEVACIZUMAB CHEMO INJECTION 1.25MG/0.05ML SYRINGE FOR KALEIDOSCOPE
1.2500 mg | INTRAVITREAL | Status: DC
  Administered 2017-07-14: 1.25 mg via INTRAVITREAL

## 2017-07-18 DIAGNOSIS — E119 Type 2 diabetes mellitus without complications: Secondary | ICD-10-CM | POA: Diagnosis not present

## 2017-07-18 DIAGNOSIS — I10 Essential (primary) hypertension: Secondary | ICD-10-CM | POA: Diagnosis not present

## 2017-07-25 ENCOUNTER — Ambulatory Visit (INDEPENDENT_AMBULATORY_CARE_PROVIDER_SITE_OTHER): Payer: Medicare HMO | Admitting: Urology

## 2017-07-25 DIAGNOSIS — C61 Malignant neoplasm of prostate: Secondary | ICD-10-CM | POA: Diagnosis not present

## 2017-08-04 ENCOUNTER — Other Ambulatory Visit: Payer: Self-pay | Admitting: "Endocrinology

## 2017-08-05 ENCOUNTER — Telehealth: Payer: Self-pay | Admitting: *Deleted

## 2017-08-05 NOTE — Telephone Encounter (Signed)
Called patient to inform of fid. Markers and space oar for August 5 @ Dr. Ralene Muskrat Office and his sim on August 8- arrival time - 8:45 am @ Dr. Johny Shears Office, spoke with patient and he is aware of these appts.

## 2017-08-12 ENCOUNTER — Other Ambulatory Visit: Payer: Self-pay | Admitting: "Endocrinology

## 2017-08-12 MED ORDER — INSULIN ASPART 100 UNIT/ML FLEXPEN: 25.0000 [IU] | PEN_INJECTOR | Freq: Three times a day (TID) | SUBCUTANEOUS | 0 refills | Status: DC

## 2017-08-13 ENCOUNTER — Encounter (INDEPENDENT_AMBULATORY_CARE_PROVIDER_SITE_OTHER): Payer: Medicare HMO | Admitting: Ophthalmology

## 2017-08-14 ENCOUNTER — Other Ambulatory Visit: Payer: Self-pay | Admitting: Urology

## 2017-08-14 DIAGNOSIS — C61 Malignant neoplasm of prostate: Secondary | ICD-10-CM

## 2017-08-18 DIAGNOSIS — C61 Malignant neoplasm of prostate: Secondary | ICD-10-CM | POA: Diagnosis not present

## 2017-08-20 ENCOUNTER — Other Ambulatory Visit: Payer: Self-pay | Admitting: Urology

## 2017-08-20 ENCOUNTER — Encounter (INDEPENDENT_AMBULATORY_CARE_PROVIDER_SITE_OTHER): Payer: Medicare HMO | Admitting: Ophthalmology

## 2017-08-20 MED ORDER — LORAZEPAM 1 MG PO TABS: 1.0000 mg | ORAL_TABLET | ORAL | 0 refills | Status: DC | PRN

## 2017-08-21 ENCOUNTER — Ambulatory Visit
Admission: RE | Admit: 2017-08-21 | Discharge: 2017-08-21 | Disposition: A | Discharge status: Home / Self Care | Payer: Medicare HMO | Source: Ambulatory Visit | Attending: Radiation Oncology | Admitting: Radiation Oncology

## 2017-08-21 DIAGNOSIS — C61 Malignant neoplasm of prostate: Secondary | ICD-10-CM | POA: Diagnosis not present

## 2017-08-21 DIAGNOSIS — Z510 Encounter for antineoplastic radiation therapy: Secondary | ICD-10-CM | POA: Insufficient documentation

## 2017-08-21 NOTE — Progress Notes (Signed)
  Radiation Oncology         [phone] ________________________________  Name: George [name] MRN: [id number]  Date: 08/21/2017  DOB: [birth year]  SIMULATION AND TREATMENT PLANNING NOTE    ICD-10-CM   1. Malignant neoplasm of prostate ([name]) C61     DIAGNOSIS:  72 y.o. Stage T2a adenocarcinoma of the prostate with Gleason Score of 5+3, and PSA of 6.5  NARRATIVE:  The patient was brought to the [facility].  Identity was confirmed.  All relevant records and images related to the planned course of therapy were reviewed.  The patient freely provided informed written consent to proceed with treatment after reviewing the details related to the planned course of therapy. The consent form was witnessed and verified by the simulation staff.  Then, the patient was set-up in a stable reproducible supine position for radiation therapy.  A vacuum lock pillow device was custom fabricated to position his legs in a reproducible immobilized position.  Then, I performed a urethrogram under sterile conditions to identify the prostatic apex.  CT images were obtained.  Surface markings were placed.  The CT images were loaded into the planning software.  Then the prostate target and avoidance structures including the rectum, bladder, bowel and hips were contoured.  Treatment planning then occurred.  The radiation prescription was entered and confirmed.  A total of 5 complex treatment devices were fabricated including the BodyFix positioner and 4 MLC apertures to shield critical structures. I have requested : 3D Simulation  I have requested a DVH of the following structures: Rectum, Bladder, Left Hip, Right Hip, Small Intestine, Prostate and PTV.  PUBIC ARCH STUDY:  The patient also underwent evaluation for possible prostate seed implant boost. His 3-dimensional image study set was used for this purpose planning computer. There, on each axial slice, I contoured the prostate gland. Then, using  three-dimensional radiation planning tools I reconstructed the prostate in view of the structures from the transperineal needle pathway to assess for possible pubic arch interference. In doing so, I did not appreciate any pubic arch interference. Also, the patient's prostate volume was estimated based on the drawn structure. The volume was 25 cc.  Given the pubic arch appearance and prostate volume, patient remains a good candidate to proceed with prostate seed implant boost    PLAN:  The patient will receive 45 Gy in 25 fractions followed by prostate seed boost adding 110 Gy for a nominal total dose of 155 Gy. ________________________________  Sheral Apley Tammi Klippel, M.D.

## 2017-08-22 ENCOUNTER — Ambulatory Visit (HOSPITAL_COMMUNITY)
Admission: RE | Admit: 2017-08-22 | Discharge: 2017-08-22 | Disposition: A | Discharge status: Home / Self Care | Payer: Medicare HMO | Source: Ambulatory Visit | Attending: Urology | Admitting: Urology

## 2017-08-22 ENCOUNTER — Encounter: Payer: Self-pay | Admitting: "Endocrinology

## 2017-08-22 DIAGNOSIS — C61 Malignant neoplasm of prostate: Secondary | ICD-10-CM

## 2017-08-25 NOTE — Progress Notes (Signed)
[facility] Note  08/26/2017     CHIEF COMPLAINT Patient presents for Retina Follow Up   HISTORY OF PRESENT ILLNESS: George [name] is a 72 y.o. male who presents to the clinic today for:   HPI    Retina Follow Up    Patient presents with  Wet AMD.  In right eye.  This started 3 months ago.  Severity is mild.  Since onset it is gradually improving.  I, the attending physician,  performed the HPI with the patient and updated documentation appropriately.          Comments    F/U EXU AMD OD. 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   Pt ready for tx if indicted.       Last edited by Bernarda Caffey, MD on 08/26/2017  3:30 PM. (History)      Referring physician: Monico Blitz, MD [place], [place] [id number]  HISTORICAL INFORMATION:   Selected notes from the MEDICAL RECORD NUMBER Referred by Dr. Rocky Link for retinal hemorrhage LEE: 05.10.19 (R. Turner) [BCVA: OD: 20/20 OS: 20/20] Ocular Hx-Pseudophakia PMH-DM(on Lantus and Metformin), HTN, Arthritis    CURRENT MEDICATIONS: No current outpatient medications on file. (Ophthalmic Drugs)   No current facility-administered medications for this visit.  (Ophthalmic Drugs)   Current Outpatient Medications (Other)  Medication Sig  . amLODipine (NORVASC) 5 MG tablet Take 1 tablet by mouth daily.  Marland Kitchen atorvastatin (LIPITOR) 20 MG tablet Take 20 mg by mouth daily.  . Blood Glucose Monitoring Suppl (TRUE METRIX AIR GLUCOSE METER) w/Device KIT See admin instructions. Blood Glucose Monitoring Supplies (True Metrix Air Glucose Meter)  . cloNIDine (CATAPRES) 0.1 MG tablet Take 1 tablet by mouth daily.  . Continuous Blood Gluc Sensor (FREESTYLE LIBRE 14 DAY SENSOR) MISC Inject 1 each into the skin every 14 (fourteen) days. Use as directed.  Marland Kitchen GLOBAL EASE INJECT PEN  NEEDLES 31G X 5 MM MISC 3 (three) times daily.  . insulin aspart (NOVOLOG FLEXPEN) 100 UNIT/ML FlexPen Inject 25-31 Units into the skin 3 (three) times daily before meals.  . Insulin Glargine (LANTUS SOLOSTAR) 100 UNIT/ML Solostar Pen Inject 60 Units into the skin at bedtime.  Marland Kitchen LORazepam (ATIVAN) 1 MG tablet Take 1 tablet (1 mg total) by mouth as needed for anxiety (30 minutes prior to MRI scan and may repeat 30 minutes later if needed).  . losartan (COZAAR) 100 MG tablet Take 1 tablet by mouth daily.  . metFORMIN (GLUCOPHAGE) 500 MG tablet TAKE 1 TABLET BY MOUTH TWICE DAILY WITH A MEAL  . metoprolol succinate (TOPROL-XL) 50 MG 24 hr tablet Take 1 tablet (50 mg total) by mouth daily. Take with or immediately following a meal.  . nitroGLYCERIN (NITROSTAT) 0.4 MG SL tablet PLACE 1 TABLET UNDER TONGUE EVERY 5 MINUTES UP TO 3 DOSES, IF NO RELIEF CALL 911 -- EMERGENCY REFILL FAXED DR  . NON FORMULARY True Metrix Strips (DiabeticClub50)   Current Facility-Administered Medications (Other)  Medication Route  . Bevacizumab (AVASTIN) SOLN 1.25 mg Intravitreal  . Bevacizumab (AVASTIN) SOLN 1.25 mg Intravitreal  . Bevacizumab (AVASTIN) SOLN 1.25 mg Intravitreal      REVIEW OF SYSTEMS: ROS    Positive for: Endocrine, Eyes   Negative for: Constitutional, Gastrointestinal, Neurological, Skin, Genitourinary, Musculoskeletal, HENT, Cardiovascular, Respiratory, Psychiatric, Allergic/Imm, Heme/Lymph   Last  edited by Zenovia Jordan, LPN on 1/61/0960  4:54 PM. (History)       ALLERGIES No Known Allergies  PAST MEDICAL HISTORY Past Medical History:  Diagnosis Date  . Acute upper respiratory infections of unspecified site   . Cancer [facility])    Prostate  . Chest pain   . Chronic back pain    buldging disc and inflammatory arthritis  . Colitis, enteritis, and gastroenteritis of presumed infectious origin   . Confusion    occasionally short term  . Coronary artery disease   . Dermatophytosis of  the body   . Diabetes mellitus without complication ([place])    takes Lantus and metformina and amaryl daily  . Diabetic neuropathy ([place])   . Dizziness   . Generalized osteoarthrosis, unspecified site   . GERD (gastroesophageal reflux disease)    takes Omeprazole daily  . Herpes zoster without mention of complication   . History of bronchitis many yrs ago  . HOH (hard of hearing)   . Joint pain   . Lumbago   . Other dyspnea and respiratory abnormality    with exertion  . Pleurisy without mention of effusion or current tuberculosis   . Prostate cancer ([place])   . Psoriasis   . Psoriasis and similar disorder   . Pure hypercholesterolemia    takes Atorvastatin daily  . Sinusitis    takes Allegra nightly and FLonase prn  . Unspecified essential hypertension    takes Amlodipine,Losartan,CLonidine,Lisinopril,and Metoprolol daily   Past Surgical History:  Procedure Laterality Date  . CATARACT EXTRACTION W/PHACO Left 04/04/2014   Procedure: CATARACT EXTRACTION PHACO AND INTRAOCULAR LENS PLACEMENT (IOC);  Surgeon: Williams Che, MD;  Location: [facility];  Service: Ophthalmology;  Laterality: Left;  CDE:4.49  . CATARACT EXTRACTION W/PHACO Right 01/02/2015   Procedure: CATARACT EXTRACTION PHACO AND INTRAOCULAR LENS PLACEMENT; CDE:  6.36;  Surgeon: Williams Che, MD;  Location: [facility];  Service: Ophthalmology;  Laterality: Right;  . CIRCUMCISION  2010  . CORONARY ARTERY BYPASS GRAFT N/A 08/13/2012   Procedure: CORONARY ARTERY BYPASS GRAFTING (CABG);  Surgeon: Ivin Poot, MD;  Location: [place];  Service: Open Heart Surgery;  Laterality: N/A;  . ENDOVEIN HARVEST OF GREATER SAPHENOUS VEIN Right 08/13/2012   Procedure: ENDOVEIN HARVEST OF GREATER SAPHENOUS VEIN;  Surgeon: Ivin Poot, MD;  Location: [place];  Service: Open Heart Surgery;  Laterality: Right;  . INTRAOPERATIVE TRANSESOPHAGEAL ECHOCARDIOGRAM N/A 08/13/2012   Procedure: INTRAOPERATIVE TRANSESOPHAGEAL ECHOCARDIOGRAM;  Surgeon: Ivin Poot, MD;  Location: [place];  Service: Open Heart Surgery;  Laterality: N/A;  . LEFT HEART CATHETERIZATION WITH CORONARY ANGIOGRAM N/A 08/07/2012   Procedure: LEFT HEART CATHETERIZATION WITH CORONARY ANGIOGRAM;  Surgeon: Peter M Martinique, MD;  Location[facility] C[facility] LAB;  Service: Cardiovascular;  Laterality: N/A;    FAMILY HISTORY Family History  Problem Relation Age of Onset  . CAD Unknown   . Heart attack Unknown   . Diabetes Unknown   . Cataracts Mother   . Amblyopia Neg Hx   . Blindness Neg Hx   . Glaucoma Neg Hx   . Macular degeneration Neg Hx   . Retinal detachment Neg Hx   . Strabismus Neg Hx   . Retinitis pigmentosa Neg Hx     SOCIAL HISTORY Social History   Tobacco Use  . Smoking status: Former Smoker    Packs/day: 1.00    Years: 4.00    Pack years: 4.00    Types: Cigarettes    Start  date: 03/04/1965    Last attempt to quit: 03/05/1967    Years since quitting: 50.5  . Smokeless tobacco: Never Used  Substance Use Topics  . Alcohol use: No    Alcohol/week: 0.0 standard drinks  . Drug use: No         OPHTHALMIC EXAM:  Base Eye Exam    Visual Acuity (Snellen - Linear)      Right Left   Dist [place] 20/25 20/60   Dist ph [place] NI 20/50 +2   Correction:  Glasses       Tonometry (Tonopen, 1:57 PM)      Right Left   Pressure 17 17       Pupils      Dark Light Shape React APD   Right 4 3 Round Brisk None   Left 4 3 Round Brisk None       Visual Fields (Counting fingers)      Left Right    Full Full       Extraocular Movement      Right Left    Full, Ortho Full, Ortho       Neuro/Psych    Oriented x3:  Yes   Mood/Affect:  Normal       Dilation    Both eyes:  1.0% Mydriacyl, 2.5% Phenylephrine @ 1:57 PM        Slit Lamp and Fundus Exam    Slit Lamp Exam      Right Left   Lids/Lashes Dermatochalasis - upper lid Dermatochalasis - upper lid   Conjunctiva/Sclera Temporal and Nasal Pinguecula Temporal and Nasal Pinguecula   Cornea 1+ Inferior  Punctate epithelial erosions, Arcus, superior cataract wounds with refractile deposites Arcus, superior cataract wounds well healed   Anterior Chamber Deep and quiet, no cell or flare Deep and quiet, no cell or flare   Iris Round and dilated, No NVI Round and dilated, No NVI   Lens PC IOL in good position, open PC PC IOL in good position, open PC   Vitreous Vitreous syneresis, Posterior vitreous detachment Vitreous syneresis, Posterior vitreous detachment       Fundus Exam      Right Left   Disc 360 Peripapillary atrophy, ring of pigment clumping /PED from 0300 to 0400 with surrouding sub retina hemorrhage, +SVP Sharp, 360 Peripapillary atrophy   C/D Ratio 0.4 0.4   Macula Flat, Blunted foveal reflex, Drusen, RPE mottling and clumping, early Atrophy Flat, Blunted foveal reflex, RPE mottling and clumping, Drusen, No heme or edema   Vessels Vascular attenuation, AV crossing changes Mild Vascular attenuation, mild AV crossing changes   Periphery Attached Attached, irregular pigmented lesion along ST arcade about 1.5DD area - flat          IMAGING AND PROCEDURES  Imaging and Procedures for _0 @  OCT, Retina - OU - Both Eyes       Right Eye Quality was good. Central Foveal Thickness: 299. Progression has improved. Findings include normal foveal contour, retinal drusen , outer retinal atrophy, pigment epithelial detachment, no IRF, no SRF (peripapillary SRHM nasal to disc with overlying IRF/SRF --mild interval worsening; interval improvement in [facility]).   Left Eye Quality was good. Central Foveal Thickness: 296. Progression has been stable. Findings include normal foveal contour, no IRF, no SRF, retinal drusen , pigment epithelial detachment, outer retinal atrophy.   Notes *Images captured and stored on drive  Diagnosis / Impression:  Exudative ARMD OD -- peripapillary CNVM with +SRF/IRF -- interval improvement Nonexudative  ARMD OS   Clinical management:  See  below  Abbreviations: NFP - Normal foveal profile. CME - cystoid macular edema. PED - pigment epithelial detachment. IRF - intraretinal fluid. SRF - subretinal fluid. EZ - ellipsoid zone. ERM - epiretinal membrane. ORA - outer retinal atrophy. ORT - outer retinal tubulation. SRHM - subretinal hyper-reflective material         Intravitreal Injection, Pharmacologic Agent - OD - Right Eye       Time Out 08/26/2017. 3:24 PM. Confirmed correct patient, procedure, site, and patient consented.   Anesthesia Topical anesthesia was used. Anesthetic medications included Lidocaine 2%, Tetracaine 0.5%.   Procedure Preparation included 5% betadine to ocular surface, eyelid speculum. A supplied needle was used.   Injection:  1.25 mg Bevacizumab 1.18m/0.05ml   NDC: 50242-060-01, Lot: [id number], Expiration date: 09/17/2017   Route: Intravitreal, Site: Right Eye, Waste: 0 mg  Post-op Post injection exam found visual acuity of at least counting fingers. The patient tolerated the procedure well. There were no complications. The patient received written and verbal post procedure care education.                 ASSESSMENT/PLAN:    ICD-10-CM   1. Exudative age-related macular degeneration of right eye with active choroidal neovascularization (HCC) H35.3211 OCT, Retina - OU - Both Eyes    Intravitreal Injection, Pharmacologic Agent - OD - Right Eye    Bevacizumab (AVASTIN) SOLN 1.25 mg  2. Retinal edema H35.81 OCT, Retina - OU - Both Eyes  3. Intermediate stage nonexudative age-related macular degeneration of left eye H35.3122   4. 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 Posterior vitreous detachment of both eyes H43.813   6. Pseudophakia of both eyes Z96.1   7. PCO (posterior capsular opacification), bilateral H26.493     1, 2. Exudative age related macular degeneration, OD  - peripapillary CNVM with +subretinal heme and edema, nasal disc   - S/P IVA OD #1 (05.15.19), #2 (07.01.19)  -  OCT shows drusen and low-lying PED without IRF/SRF in macula; but peripapillary IRF/SRF overlying PED/SRHM corresponding to subretinal heme OD -- interval improvement in IRF/SRF/PED - recommend IVA #3 OD today (08.13.19) - pt wishes to proceed - RBA of procedure discussed, questions answered - informed consent obtained and signed - see procedure note - F/U 4-6 weeks  3. Age related macular degeneration, non-exudative, OS  - The incidence, anatomy, and pathology of dry AMD, risk of progression, and the AREDS and AREDS 2 study including smoking risks discussed with patient.  - continue amsler grid monitoring  4. Choroidal nevus OS- - flat pigmented lesion along sup temp arcade - no suspcious features -- low concern for choroidal melanoma - monitor  5. PVD / vitreous syneresis OU  Discussed findings and prognosis  No RT or RD on 360 exam  Reviewed s/s of RT/RD  Strict return precautions for any such RT/RD signs/symptoms  6. Pseudophakia OU  - s/p CE/IOL OU by Dr. HIona Hansenin 2016  - doing well  - monitor  7. PCO OU -- OD>OS - S/P YAG cap OD (05.29.19) - looks great with PC nicely open OD - OS not yet visually significant - monitor    Ophthalmic Meds Ordered this visit:  Meds ordered this encounter  Medications  . Bevacizumab (AVASTIN) SOLN 1.25 mg       Return in about 6 weeks (around 10/07/2017) for F/U Exu AMD OD, DFE, OCT.  There are no Patient Instructions  on file for this visit.   Explained the diagnoses, plan, and follow up with the patient and they expressed understanding.  Patient expressed understanding of the importance of proper follow up care.   This document serves as a record of services personally performed by Gardiner Sleeper, MD, PhD. It was created on their behalf by Ernest Mallick, OA, an ophthalmic assistant. The creation of this record is the provider's dictation and/or activities during the visit.    Electronically signed by: Ernest Mallick, OA   08.12.2019 8:36 AM     Gardiner Sleeper, M.D., Ph.D. Diseases & Surgery of the Retina and Vitreous [facility]   I have reviewed the above documentation for accuracy and completeness, and I agree with the above. Gardiner Sleeper, M.D., Ph.D. 08/27/17 8:38 AM   Abbreviations: M myopia (nearsighted); A astigmatism; H hyperopia (farsighted); P presbyopia; Mrx spectacle prescription;  CTL contact lenses; OD right eye; OS left eye; OU both eyes  XT exotropia; ET esotropia; PEK punctate epithelial keratitis; PEE punctate epithelial erosions; DES dry eye syndrome; MGD meibomian gland dysfunction; ATs artificial tears; PFAT's preservative free artificial tears; [place] nuclear sclerotic cataract; PSC posterior subcapsular cataract; ERM epi-retinal membrane; PVD posterior vitreous detachment; RD retinal detachment; DM diabetes mellitus; DR diabetic retinopathy; NPDR non-proliferative diabetic retinopathy; PDR proliferative diabetic retinopathy; CSME clinically significant macular edema; DME diabetic macular edema; dbh dot blot hemorrhages; CWS cotton wool spot; POAG primary open angle glaucoma; C/D cup-to-disc ratio; HVF humphrey visual field; GVF goldmann visual field; OCT optical coherence tomography; IOP intraocular pressure; BRVO Branch retinal vein occlusion; CRVO central retinal vein occlusion; CRAO central retinal artery occlusion; BRAO branch retinal artery occlusion; RT retinal tear; SB scleral buckle; PPV pars plana vitrectomy; VH Vitreous hemorrhage; PRP panretinal laser photocoagulation; IVK intravitreal kenalog; VMT vitreomacular traction; MH Macular hole;  NVD neovascularization of the disc; NVE neovascularization elsewhere; AREDS age related eye disease study; ARMD age related macular degeneration; POAG primary open angle glaucoma; EBMD epithelial/anterior basement membrane dystrophy; ACIOL anterior chamber intraocular lens; IOL intraocular lens; PCIOL posterior chamber  intraocular lens; Phaco/IOL phacoemulsification with intraocular lens placement; [place] photorefractive keratectomy; LASIK laser assisted in situ keratomileusis; HTN hypertension; DM diabetes mellitus; COPD chronic obstructive pulmonary disease

## 2017-08-26 ENCOUNTER — Other Ambulatory Visit: Payer: Self-pay | Admitting: Urology

## 2017-08-26 ENCOUNTER — Ambulatory Visit (INDEPENDENT_AMBULATORY_CARE_PROVIDER_SITE_OTHER): Payer: Medicare HMO | Admitting: Ophthalmology

## 2017-08-26 ENCOUNTER — Encounter (INDEPENDENT_AMBULATORY_CARE_PROVIDER_SITE_OTHER): Payer: Self-pay | Admitting: Ophthalmology

## 2017-08-26 DIAGNOSIS — D3132 Benign neoplasm of left choroid: Secondary | ICD-10-CM

## 2017-08-26 DIAGNOSIS — H3581 Retinal edema: Secondary | ICD-10-CM | POA: Diagnosis not present

## 2017-08-26 DIAGNOSIS — H43813 Vitreous degeneration, bilateral: Secondary | ICD-10-CM | POA: Diagnosis not present

## 2017-08-26 DIAGNOSIS — H353211 Exudative age-related macular degeneration, right eye, with active choroidal neovascularization: Secondary | ICD-10-CM

## 2017-08-26 DIAGNOSIS — Z961 Presence of intraocular lens: Secondary | ICD-10-CM

## 2017-08-26 DIAGNOSIS — H353122 Nonexudative age-related macular degeneration, left eye, intermediate dry stage: Secondary | ICD-10-CM | POA: Diagnosis not present

## 2017-08-26 DIAGNOSIS — H26493 Other secondary cataract, bilateral: Secondary | ICD-10-CM

## 2017-08-26 MED ORDER — BEVACIZUMAB CHEMO INJECTION 1.25MG/0.05ML SYRINGE FOR KALEIDOSCOPE
1.2500 mg | INTRAVITREAL | Status: DC
Start: 2017-08-26 — End: 2020-09-19
  Administered 2017-08-26: 1.25 mg via INTRAVITREAL

## 2017-08-26 MED ORDER — LORAZEPAM 1 MG PO TABS: 1.0000 mg | ORAL_TABLET | ORAL | 0 refills | Status: DC | PRN

## 2017-08-27 ENCOUNTER — Other Ambulatory Visit: Payer: Self-pay | Admitting: Urology

## 2017-08-27 ENCOUNTER — Encounter (INDEPENDENT_AMBULATORY_CARE_PROVIDER_SITE_OTHER): Payer: Self-pay | Admitting: Ophthalmology

## 2017-08-27 ENCOUNTER — Telehealth: Payer: Self-pay | Admitting: *Deleted

## 2017-08-27 DIAGNOSIS — C61 Malignant neoplasm of prostate: Secondary | ICD-10-CM

## 2017-08-27 NOTE — Telephone Encounter (Signed)
CALLED PATIENT TO INFORM THAT THEY WOULD BE FORGOING MRI PER DR. MANNING, AND PT. TO START ON 09-01-17, SPOKE WITH PATIENT AND HE VERIFIED UNDERSTANDING THIS

## 2017-08-28 DIAGNOSIS — Z510 Encounter for antineoplastic radiation therapy: Secondary | ICD-10-CM | POA: Diagnosis not present

## 2017-08-28 DIAGNOSIS — C61 Malignant neoplasm of prostate: Secondary | ICD-10-CM | POA: Diagnosis not present

## 2017-08-29 ENCOUNTER — Ambulatory Visit (INDEPENDENT_AMBULATORY_CARE_PROVIDER_SITE_OTHER): Payer: Medicare HMO | Admitting: Urology

## 2017-08-29 DIAGNOSIS — C61 Malignant neoplasm of prostate: Secondary | ICD-10-CM | POA: Diagnosis not present

## 2017-08-31 ENCOUNTER — Ambulatory Visit: Admission: RE | Admit: 2017-08-31 | Payer: Medicare HMO | Source: Ambulatory Visit

## 2017-09-01 ENCOUNTER — Ambulatory Visit
Admission: RE | Admit: 2017-09-01 | Discharge: 2017-09-01 | Disposition: A | Discharge status: Home / Self Care | Payer: Medicare HMO | Source: Ambulatory Visit | Attending: Radiation Oncology | Admitting: Radiation Oncology

## 2017-09-01 DIAGNOSIS — Z510 Encounter for antineoplastic radiation therapy: Secondary | ICD-10-CM | POA: Diagnosis not present

## 2017-09-01 DIAGNOSIS — C61 Malignant neoplasm of prostate: Secondary | ICD-10-CM | POA: Diagnosis not present

## 2017-09-02 ENCOUNTER — Ambulatory Visit
Admission: RE | Admit: 2017-09-02 | Discharge: 2017-09-02 | Disposition: A | Discharge status: Home / Self Care | Payer: Medicare HMO | Source: Ambulatory Visit | Attending: Radiation Oncology | Admitting: Radiation Oncology

## 2017-09-02 DIAGNOSIS — C61 Malignant neoplasm of prostate: Secondary | ICD-10-CM | POA: Diagnosis not present

## 2017-09-02 DIAGNOSIS — Z510 Encounter for antineoplastic radiation therapy: Secondary | ICD-10-CM | POA: Diagnosis not present

## 2017-09-03 ENCOUNTER — Ambulatory Visit
Admission: RE | Admit: 2017-09-03 | Discharge: 2017-09-03 | Disposition: A | Discharge status: Home / Self Care | Payer: Medicare HMO | Source: Ambulatory Visit | Attending: Radiation Oncology | Admitting: Radiation Oncology

## 2017-09-03 DIAGNOSIS — C61 Malignant neoplasm of prostate: Secondary | ICD-10-CM | POA: Diagnosis not present

## 2017-09-03 DIAGNOSIS — Z510 Encounter for antineoplastic radiation therapy: Secondary | ICD-10-CM | POA: Diagnosis not present

## 2017-09-04 ENCOUNTER — Ambulatory Visit
Admission: RE | Admit: 2017-09-04 | Discharge: 2017-09-04 | Disposition: A | Discharge status: Home / Self Care | Payer: Medicare HMO | Source: Ambulatory Visit | Attending: Radiation Oncology | Admitting: Radiation Oncology

## 2017-09-04 DIAGNOSIS — C61 Malignant neoplasm of prostate: Secondary | ICD-10-CM | POA: Diagnosis not present

## 2017-09-04 DIAGNOSIS — Z510 Encounter for antineoplastic radiation therapy: Secondary | ICD-10-CM | POA: Diagnosis not present

## 2017-09-05 ENCOUNTER — Ambulatory Visit
Admission: RE | Admit: 2017-09-05 | Discharge: 2017-09-05 | Disposition: A | Discharge status: Home / Self Care | Payer: Medicare HMO | Source: Ambulatory Visit | Attending: Radiation Oncology | Admitting: Radiation Oncology

## 2017-09-05 DIAGNOSIS — Z510 Encounter for antineoplastic radiation therapy: Secondary | ICD-10-CM | POA: Diagnosis not present

## 2017-09-05 DIAGNOSIS — C61 Malignant neoplasm of prostate: Secondary | ICD-10-CM | POA: Diagnosis not present

## 2017-09-08 ENCOUNTER — Ambulatory Visit: Payer: Medicare HMO | Admitting: Nutrition

## 2017-09-08 ENCOUNTER — Ambulatory Visit: Payer: Medicare HMO | Admitting: "Endocrinology

## 2017-09-08 ENCOUNTER — Ambulatory Visit
Admission: RE | Admit: 2017-09-08 | Discharge: 2017-09-08 | Disposition: A | Discharge status: Home / Self Care | Payer: Medicare HMO | Source: Ambulatory Visit | Attending: Radiation Oncology | Admitting: Radiation Oncology

## 2017-09-08 DIAGNOSIS — Z510 Encounter for antineoplastic radiation therapy: Secondary | ICD-10-CM | POA: Diagnosis not present

## 2017-09-08 DIAGNOSIS — C61 Malignant neoplasm of prostate: Secondary | ICD-10-CM | POA: Diagnosis not present

## 2017-09-09 ENCOUNTER — Ambulatory Visit
Admission: RE | Admit: 2017-09-09 | Discharge: 2017-09-09 | Disposition: A | Discharge status: Home / Self Care | Payer: Medicare HMO | Source: Ambulatory Visit | Attending: Radiation Oncology | Admitting: Radiation Oncology

## 2017-09-09 DIAGNOSIS — Z510 Encounter for antineoplastic radiation therapy: Secondary | ICD-10-CM | POA: Diagnosis not present

## 2017-09-09 DIAGNOSIS — C61 Malignant neoplasm of prostate: Secondary | ICD-10-CM | POA: Diagnosis not present

## 2017-09-10 ENCOUNTER — Ambulatory Visit
Admission: RE | Admit: 2017-09-10 | Discharge: 2017-09-10 | Disposition: A | Discharge status: Home / Self Care | Payer: Medicare HMO | Source: Ambulatory Visit | Attending: Radiation Oncology | Admitting: Radiation Oncology

## 2017-09-10 DIAGNOSIS — Z510 Encounter for antineoplastic radiation therapy: Secondary | ICD-10-CM | POA: Diagnosis not present

## 2017-09-10 DIAGNOSIS — C61 Malignant neoplasm of prostate: Secondary | ICD-10-CM | POA: Diagnosis not present

## 2017-09-11 ENCOUNTER — Ambulatory Visit
Admission: RE | Admit: 2017-09-11 | Discharge: 2017-09-11 | Disposition: A | Discharge status: Home / Self Care | Payer: Medicare HMO | Source: Ambulatory Visit | Attending: Radiation Oncology | Admitting: Radiation Oncology

## 2017-09-11 DIAGNOSIS — E119 Type 2 diabetes mellitus without complications: Secondary | ICD-10-CM | POA: Diagnosis not present

## 2017-09-11 DIAGNOSIS — C61 Malignant neoplasm of prostate: Secondary | ICD-10-CM | POA: Diagnosis not present

## 2017-09-11 DIAGNOSIS — I10 Essential (primary) hypertension: Secondary | ICD-10-CM | POA: Diagnosis not present

## 2017-09-11 DIAGNOSIS — Z510 Encounter for antineoplastic radiation therapy: Secondary | ICD-10-CM | POA: Diagnosis not present

## 2017-09-12 ENCOUNTER — Ambulatory Visit
Admission: RE | Admit: 2017-09-12 | Discharge: 2017-09-12 | Disposition: A | Discharge status: Home / Self Care | Payer: Medicare HMO | Source: Ambulatory Visit | Attending: Radiation Oncology | Admitting: Radiation Oncology

## 2017-09-12 DIAGNOSIS — Z510 Encounter for antineoplastic radiation therapy: Secondary | ICD-10-CM | POA: Diagnosis not present

## 2017-09-12 DIAGNOSIS — C61 Malignant neoplasm of prostate: Secondary | ICD-10-CM | POA: Diagnosis not present

## 2017-09-16 ENCOUNTER — Ambulatory Visit
Admission: RE | Admit: 2017-09-16 | Discharge: 2017-09-16 | Disposition: A | Discharge status: Home / Self Care | Payer: Medicare HMO | Source: Ambulatory Visit | Attending: Radiation Oncology | Admitting: Radiation Oncology

## 2017-09-16 DIAGNOSIS — Z510 Encounter for antineoplastic radiation therapy: Secondary | ICD-10-CM | POA: Insufficient documentation

## 2017-09-16 DIAGNOSIS — C61 Malignant neoplasm of prostate: Secondary | ICD-10-CM | POA: Insufficient documentation

## 2017-09-17 ENCOUNTER — Ambulatory Visit
Admission: RE | Admit: 2017-09-17 | Discharge: 2017-09-17 | Disposition: A | Discharge status: Home / Self Care | Payer: Medicare HMO | Source: Ambulatory Visit | Attending: Radiation Oncology | Admitting: Radiation Oncology

## 2017-09-17 ENCOUNTER — Other Ambulatory Visit: Payer: Self-pay | Admitting: "Endocrinology

## 2017-09-17 DIAGNOSIS — Z510 Encounter for antineoplastic radiation therapy: Secondary | ICD-10-CM | POA: Diagnosis not present

## 2017-09-17 DIAGNOSIS — C61 Malignant neoplasm of prostate: Secondary | ICD-10-CM | POA: Diagnosis not present

## 2017-09-18 ENCOUNTER — Ambulatory Visit
Admission: RE | Admit: 2017-09-18 | Discharge: 2017-09-18 | Disposition: A | Discharge status: Home / Self Care | Payer: Medicare HMO | Source: Ambulatory Visit | Attending: Radiation Oncology | Admitting: Radiation Oncology

## 2017-09-18 DIAGNOSIS — Z510 Encounter for antineoplastic radiation therapy: Secondary | ICD-10-CM | POA: Diagnosis not present

## 2017-09-18 DIAGNOSIS — C61 Malignant neoplasm of prostate: Secondary | ICD-10-CM | POA: Diagnosis not present

## 2017-09-19 ENCOUNTER — Ambulatory Visit
Admission: RE | Admit: 2017-09-19 | Discharge: 2017-09-19 | Disposition: A | Discharge status: Home / Self Care | Payer: Medicare HMO | Source: Ambulatory Visit | Attending: Radiation Oncology | Admitting: Radiation Oncology

## 2017-09-19 DIAGNOSIS — C61 Malignant neoplasm of prostate: Secondary | ICD-10-CM | POA: Diagnosis not present

## 2017-09-19 DIAGNOSIS — Z510 Encounter for antineoplastic radiation therapy: Secondary | ICD-10-CM | POA: Diagnosis not present

## 2017-09-22 ENCOUNTER — Ambulatory Visit
Admission: RE | Admit: 2017-09-22 | Discharge: 2017-09-22 | Disposition: A | Discharge status: Home / Self Care | Payer: Medicare HMO | Source: Ambulatory Visit | Attending: Radiation Oncology | Admitting: Radiation Oncology

## 2017-09-22 DIAGNOSIS — Z510 Encounter for antineoplastic radiation therapy: Secondary | ICD-10-CM | POA: Diagnosis not present

## 2017-09-22 DIAGNOSIS — C61 Malignant neoplasm of prostate: Secondary | ICD-10-CM | POA: Diagnosis not present

## 2017-09-23 ENCOUNTER — Ambulatory Visit
Admission: RE | Admit: 2017-09-23 | Discharge: 2017-09-23 | Disposition: A | Discharge status: Home / Self Care | Payer: Medicare HMO | Source: Ambulatory Visit | Attending: Radiation Oncology | Admitting: Radiation Oncology

## 2017-09-23 DIAGNOSIS — C61 Malignant neoplasm of prostate: Secondary | ICD-10-CM | POA: Diagnosis not present

## 2017-09-23 DIAGNOSIS — Z510 Encounter for antineoplastic radiation therapy: Secondary | ICD-10-CM | POA: Diagnosis not present

## 2017-09-24 ENCOUNTER — Ambulatory Visit
Admission: RE | Admit: 2017-09-24 | Discharge: 2017-09-24 | Disposition: A | Discharge status: Home / Self Care | Payer: Medicare HMO | Source: Ambulatory Visit | Attending: Radiation Oncology | Admitting: Radiation Oncology

## 2017-09-24 DIAGNOSIS — Z510 Encounter for antineoplastic radiation therapy: Secondary | ICD-10-CM | POA: Diagnosis not present

## 2017-09-24 DIAGNOSIS — C61 Malignant neoplasm of prostate: Secondary | ICD-10-CM | POA: Diagnosis not present

## 2017-09-25 ENCOUNTER — Ambulatory Visit
Admission: RE | Admit: 2017-09-25 | Discharge: 2017-09-25 | Disposition: A | Discharge status: Home / Self Care | Payer: Medicare HMO | Source: Ambulatory Visit | Attending: Radiation Oncology | Admitting: Radiation Oncology

## 2017-09-25 DIAGNOSIS — Z510 Encounter for antineoplastic radiation therapy: Secondary | ICD-10-CM | POA: Diagnosis not present

## 2017-09-25 DIAGNOSIS — C61 Malignant neoplasm of prostate: Secondary | ICD-10-CM | POA: Diagnosis not present

## 2017-09-26 ENCOUNTER — Ambulatory Visit
Admission: RE | Admit: 2017-09-26 | Discharge: 2017-09-26 | Disposition: A | Discharge status: Home / Self Care | Payer: Medicare HMO | Source: Ambulatory Visit | Attending: Radiation Oncology | Admitting: Radiation Oncology

## 2017-09-26 DIAGNOSIS — Z510 Encounter for antineoplastic radiation therapy: Secondary | ICD-10-CM | POA: Diagnosis not present

## 2017-09-26 DIAGNOSIS — C61 Malignant neoplasm of prostate: Secondary | ICD-10-CM | POA: Diagnosis not present

## 2017-09-29 ENCOUNTER — Ambulatory Visit
Admission: RE | Admit: 2017-09-29 | Discharge: 2017-09-29 | Disposition: A | Discharge status: Home / Self Care | Payer: Medicare HMO | Source: Ambulatory Visit | Attending: Radiation Oncology | Admitting: Radiation Oncology

## 2017-09-29 DIAGNOSIS — C61 Malignant neoplasm of prostate: Secondary | ICD-10-CM | POA: Diagnosis not present

## 2017-09-29 DIAGNOSIS — Z510 Encounter for antineoplastic radiation therapy: Secondary | ICD-10-CM | POA: Diagnosis not present

## 2017-09-30 ENCOUNTER — Ambulatory Visit
Admission: RE | Admit: 2017-09-30 | Discharge: 2017-09-30 | Disposition: A | Discharge status: Home / Self Care | Payer: Medicare HMO | Source: Ambulatory Visit | Attending: Radiation Oncology | Admitting: Radiation Oncology

## 2017-09-30 DIAGNOSIS — C61 Malignant neoplasm of prostate: Secondary | ICD-10-CM | POA: Diagnosis not present

## 2017-09-30 DIAGNOSIS — Z510 Encounter for antineoplastic radiation therapy: Secondary | ICD-10-CM | POA: Diagnosis not present

## 2017-10-01 ENCOUNTER — Ambulatory Visit
Admission: RE | Admit: 2017-10-01 | Discharge: 2017-10-01 | Disposition: A | Discharge status: Home / Self Care | Payer: Medicare HMO | Source: Ambulatory Visit | Attending: Radiation Oncology | Admitting: Radiation Oncology

## 2017-10-01 DIAGNOSIS — C61 Malignant neoplasm of prostate: Secondary | ICD-10-CM | POA: Diagnosis not present

## 2017-10-01 DIAGNOSIS — Z510 Encounter for antineoplastic radiation therapy: Secondary | ICD-10-CM | POA: Diagnosis not present

## 2017-10-02 ENCOUNTER — Ambulatory Visit
Admission: RE | Admit: 2017-10-02 | Discharge: 2017-10-02 | Disposition: A | Discharge status: Home / Self Care | Payer: Medicare HMO | Source: Ambulatory Visit | Attending: Radiation Oncology | Admitting: Radiation Oncology

## 2017-10-02 DIAGNOSIS — C61 Malignant neoplasm of prostate: Secondary | ICD-10-CM | POA: Diagnosis not present

## 2017-10-02 DIAGNOSIS — Z510 Encounter for antineoplastic radiation therapy: Secondary | ICD-10-CM | POA: Diagnosis not present

## 2017-10-03 ENCOUNTER — Ambulatory Visit
Admission: RE | Admit: 2017-10-03 | Discharge: 2017-10-03 | Disposition: A | Discharge status: Home / Self Care | Payer: Medicare HMO | Source: Ambulatory Visit | Attending: Radiation Oncology | Admitting: Radiation Oncology

## 2017-10-03 DIAGNOSIS — Z510 Encounter for antineoplastic radiation therapy: Secondary | ICD-10-CM | POA: Diagnosis not present

## 2017-10-03 DIAGNOSIS — C61 Malignant neoplasm of prostate: Secondary | ICD-10-CM | POA: Diagnosis not present

## 2017-10-06 ENCOUNTER — Ambulatory Visit
Admission: RE | Admit: 2017-10-06 | Discharge: 2017-10-06 | Disposition: A | Discharge status: Home / Self Care | Payer: Medicare HMO | Source: Ambulatory Visit | Attending: Radiation Oncology | Admitting: Radiation Oncology

## 2017-10-06 DIAGNOSIS — Z510 Encounter for antineoplastic radiation therapy: Secondary | ICD-10-CM | POA: Diagnosis not present

## 2017-10-06 DIAGNOSIS — C61 Malignant neoplasm of prostate: Secondary | ICD-10-CM | POA: Diagnosis not present

## 2017-10-07 ENCOUNTER — Encounter (INDEPENDENT_AMBULATORY_CARE_PROVIDER_SITE_OTHER): Payer: Medicare HMO | Admitting: Ophthalmology

## 2017-10-08 DIAGNOSIS — E119 Type 2 diabetes mellitus without complications: Secondary | ICD-10-CM | POA: Diagnosis not present

## 2017-10-08 DIAGNOSIS — I10 Essential (primary) hypertension: Secondary | ICD-10-CM | POA: Diagnosis not present

## 2017-10-10 NOTE — Progress Notes (Signed)
[facility] Note  10/13/2017     CHIEF COMPLAINT Patient presents for Retina Follow Up   HISTORY OF PRESENT ILLNESS: George [name] is a 72 y.o. male who presents to the clinic today for:   HPI    Retina Follow Up    Patient presents with  Wet AMD.  In right eye.  This started 4 months ago.  Since onset it is stable.  I, the attending physician,  performed the HPI with the patient and updated documentation appropriately.          Comments    F/U Exu AMD OD. Patient states he has occasional blurred VA, Denies new visual onsets.Bs 164, BS have been been receiving radiation for prostates ca ,it has made BS unstable per patient. Pt completed Radiation 10/06/17, he is on hormone inj once a month.       Last edited by Bernarda Caffey, MD on 10/13/2017  2:58 PM. (History)      Referring physician: Monico Blitz, MD [place], [place] [id number]  HISTORICAL INFORMATION:   Selected notes from the MEDICAL RECORD NUMBER Referred by Dr. Rocky Link for retinal hemorrhage LEE: 05.10.19 (R. Turner) [BCVA: OD: 20/20 OS: 20/20] Ocular Hx-Pseudophakia PMH-DM(on Lantus and Metformin), HTN, Arthritis    CURRENT MEDICATIONS: No current outpatient medications on file. (Ophthalmic Drugs)   No current facility-administered medications for this visit.  (Ophthalmic Drugs)   Current Outpatient Medications (Other)  Medication Sig  . amLODipine (NORVASC) 5 MG tablet Take 1 tablet by mouth daily.  Marland Kitchen atorvastatin (LIPITOR) 20 MG tablet Take 20 mg by mouth daily.  . Blood Glucose Monitoring Suppl (TRUE METRIX AIR GLUCOSE METER) w/Device KIT See admin instructions. Blood Glucose Monitoring Supplies (True Metrix Air Glucose Meter)  . cloNIDine (CATAPRES) 0.1 MG tablet Take 1 tablet by mouth daily.  . Continuous Blood Gluc Sensor (FREESTYLE LIBRE 14 DAY SENSOR) MISC Inject 1 each into the skin every 14 (fourteen) days. Use as directed.  Marland Kitchen GLOBAL EASE INJECT PEN NEEDLES 31G X  5 MM MISC 3 (three) times daily.  . Insulin Glargine (LANTUS SOLOSTAR) 100 UNIT/ML Solostar Pen Inject 60 Units into the skin at bedtime.  Marland Kitchen LORazepam (ATIVAN) 1 MG tablet Take 1 tablet (1 mg total) by mouth as needed for anxiety (30 minutes prior to MRI scan and may repeat 30 minutes later if needed).  . losartan (COZAAR) 100 MG tablet Take 1 tablet by mouth daily.  . metFORMIN (GLUCOPHAGE) 500 MG tablet TAKE 1 TABLET BY MOUTH TWICE DAILY WITH A MEAL  . metoprolol succinate (TOPROL-XL) 50 MG 24 hr tablet Take 1 tablet (50 mg total) by mouth daily. Take with or immediately following a meal.  . nitroGLYCERIN (NITROSTAT) 0.4 MG SL tablet PLACE 1 TABLET UNDER TONGUE EVERY 5 MINUTES UP TO 3 DOSES, IF NO RELIEF CALL 911 -- EMERGENCY REFILL FAXED DR  . NON FORMULARY True Metrix Strips (DiabeticClub50)  . NOVOLOG FLEXPEN 100 UNIT/ML FlexPen INJECT 25-31 UNITS INTO THE SKIN THREE TIMES DAILY BEFORE MEALS   Current Facility-Administered Medications (Other)  Medication Route  . Bevacizumab (AVASTIN) SOLN 1.25 mg Intravitreal  . Bevacizumab (AVASTIN) SOLN 1.25 mg Intravitreal  . Bevacizumab (AVASTIN) SOLN 1.25 mg Intravitreal  . Bevacizumab (AVASTIN) SOLN 1.25 mg Intravitreal      REVIEW OF SYSTEMS: ROS    Positive for: Endocrine, Eyes   Negative for: Constitutional, Gastrointestinal, Neurological, Skin, Genitourinary, Musculoskeletal, HENT, Cardiovascular, Respiratory, Psychiatric, Allergic/Imm, Heme/Lymph  Last edited by Zenovia Jordan, LPN on 2/37/6283  1:51 PM. (History)       ALLERGIES No Known Allergies  PAST MEDICAL HISTORY Past Medical History:  Diagnosis Date  . Acute upper respiratory infections of unspecified site   . Cancer [facility])    Prostate  . Chest pain   . Chronic back pain    buldging disc and inflammatory arthritis  . Colitis, enteritis, and gastroenteritis of presumed infectious origin   . Confusion    occasionally short term  . Coronary artery disease   .  Dermatophytosis of the body   . Diabetes mellitus without complication ([place])    takes Lantus and metformina and amaryl daily  . Diabetic neuropathy ([place])   . Dizziness   . Generalized osteoarthrosis, unspecified site   . GERD (gastroesophageal reflux disease)    takes Omeprazole daily  . Herpes zoster without mention of complication   . History of bronchitis many yrs ago  . HOH (hard of hearing)   . Joint pain   . Lumbago   . Other dyspnea and respiratory abnormality    with exertion  . Pleurisy without mention of effusion or current tuberculosis   . Prostate cancer ([place])   . Psoriasis   . Psoriasis and similar disorder   . Pure hypercholesterolemia    takes Atorvastatin daily  . Sinusitis    takes Allegra nightly and FLonase prn  . Unspecified essential hypertension    takes Amlodipine,Losartan,CLonidine,Lisinopril,and Metoprolol daily   Past Surgical History:  Procedure Laterality Date  . CATARACT EXTRACTION W/PHACO Left 04/04/2014   Procedure: CATARACT EXTRACTION PHACO AND INTRAOCULAR LENS PLACEMENT (IOC);  Surgeon: Williams Che, MD;  Location: [facility];  Service: Ophthalmology;  Laterality: Left;  CDE:4.49  . CATARACT EXTRACTION W/PHACO Right 01/02/2015   Procedure: CATARACT EXTRACTION PHACO AND INTRAOCULAR LENS PLACEMENT; CDE:  6.36;  Surgeon: Williams Che, MD;  Location: [facility];  Service: Ophthalmology;  Laterality: Right;  . CIRCUMCISION  2010  . CORONARY ARTERY BYPASS GRAFT N/A 08/13/2012   Procedure: CORONARY ARTERY BYPASS GRAFTING (CABG);  Surgeon: Ivin Poot, MD;  Location: [place];  Service: Open Heart Surgery;  Laterality: N/A;  . ENDOVEIN HARVEST OF GREATER SAPHENOUS VEIN Right 08/13/2012   Procedure: ENDOVEIN HARVEST OF GREATER SAPHENOUS VEIN;  Surgeon: Ivin Poot, MD;  Location: [place];  Service: Open Heart Surgery;  Laterality: Right;  . INTRAOPERATIVE TRANSESOPHAGEAL ECHOCARDIOGRAM N/A 08/13/2012   Procedure: INTRAOPERATIVE TRANSESOPHAGEAL  ECHOCARDIOGRAM;  Surgeon: Ivin Poot, MD;  Location: [place];  Service: Open Heart Surgery;  Laterality: N/A;  . LEFT HEART CATHETERIZATION WITH CORONARY ANGIOGRAM N/A 08/07/2012   Procedure: LEFT HEART CATHETERIZATION WITH CORONARY ANGIOGRAM;  Surgeon: Peter M Martinique, MD;  Location: [facility] CATH LAB;  Service: Cardiovascular;  Laterality: N/A;    FAMILY HISTORY Family History  Problem Relation Age of Onset  . CAD Unknown   . Heart attack Unknown   . Diabetes Unknown   . Cataracts Mother   . Amblyopia Neg Hx   . Blindness Neg Hx   . Glaucoma Neg Hx   . Macular degeneration Neg Hx   . Retinal detachment Neg Hx   . Strabismus Neg Hx   . Retinitis pigmentosa Neg Hx     SOCIAL HISTORY Social History   Tobacco Use  . Smoking status: Former Smoker    Packs/day: 1.00    Years: 4.00    Pack years: 4.00    Types: Cigarettes  Start date: 03/04/1965    Last attempt to quit: 03/05/1967    Years since quitting: 50.6  . Smokeless tobacco: Never Used  Substance Use Topics  . Alcohol use: No    Alcohol/week: 0.0 standard drinks  . Drug use: No         OPHTHALMIC EXAM:  Base Eye Exam    Visual Acuity (Snellen - Linear)      Right Left   Dist [place] 20/25 20/60 +2   Dist ph [place] NI 20/40       Tonometry (Tonopen, 2:40 PM)      Right Left   Pressure 16 14       Pupils      Dark Light Shape React APD   Right 3 2 Round Brisk None   Left 3 2 Round Brisk None       Visual Fields (Counting fingers)      Left Right    Full Full       Extraocular Movement      Right Left    Full, Ortho Full, Ortho       Neuro/Psych    Oriented x3:  Yes   Mood/Affect:  Normal       Dilation    Both eyes:  1.0% Mydriacyl, 2.5% Phenylephrine @ 2:40 PM        Slit Lamp and Fundus Exam    Slit Lamp Exam      Right Left   Lids/Lashes Dermatochalasis - upper lid Dermatochalasis - upper lid   Conjunctiva/Sclera Temporal and Nasal Pinguecula Temporal and Nasal Pinguecula   Cornea 1+  Inferior Punctate epithelial erosions, Arcus, superior cataract wounds with refractile deposites Arcus, superior cataract wounds well healed   Anterior Chamber Deep and quiet, no cell or flare Deep and quiet, no cell or flare   Iris Round and dilated, No NVI Round and dilated, No NVI   Lens PC IOL in good position, open PC PC IOL in good position, 1+ non-central PCO   Vitreous Vitreous syneresis, Posterior vitreous detachment Vitreous syneresis, Posterior vitreous detachment       Fundus Exam      Right Left   Disc 360 Peripapillary atrophy, CNV with mild cystic changes, no heme, +SVP Sharp, 360 Peripapillary atrophy   C/D Ratio 0.4 0.4   Macula Flat, Blunted foveal reflex, Drusen, RPE mottling and clumping, early Atrophy, no heme Flat, Blunted foveal reflex, RPE mottling and clumping, Drusen, No heme or edema   Vessels Vascular attenuation, AV crossing changes Mild Vascular attenuation, mild AV crossing changes   Periphery Attached Attached, irregular pigmented lesion along ST arcade about 1.5DD area - flat          IMAGING AND PROCEDURES  Imaging and Procedures for @TODAY @  OCT, Retina - OU - Both Eyes       Right Eye Quality was good. Central Foveal Thickness: 301. Progression has been stable. Findings include normal foveal contour, retinal drusen , outer retinal atrophy, pigment epithelial detachment, no IRF, no SRF (Persistent SRHM / PED with overlying cystic change).   Left Eye Quality was good. Central Foveal Thickness: 303. Progression has been stable. Findings include normal foveal contour, no IRF, no SRF, retinal drusen , pigment epithelial detachment, outer retinal atrophy.   Notes *Images captured and stored on drive  Diagnosis / Impression:  Exudative ARMD OD -- persistent peripapillary SRHM/PED with overlying cystic change Nonexudative ARMD OS   Clinical management:  See below  Abbreviations: NFP - Normal  foveal profile. CME - cystoid macular edema. PED -  pigment epithelial detachment. IRF - intraretinal fluid. SRF - subretinal fluid. EZ - ellipsoid zone. ERM - epiretinal membrane. ORA - outer retinal atrophy. ORT - outer retinal tubulation. SRHM - subretinal hyper-reflective material         Intravitreal Injection, Pharmacologic Agent - OD - Right Eye       Time Out 10/13/2017. 3:21 PM. Confirmed correct patient, procedure, site, and patient consented.   Anesthesia Topical anesthesia was used. Anesthetic medications included Lidocaine 2%, Proparacaine 0.5%.   Procedure Preparation included 5% betadine to ocular surface, eyelid speculum. A 30 gauge needle was used.   Injection:  1.25 mg Bevacizumab 1.64m/0.05ml   NDC: 50242-060-01, Lot: [id number] , Expiration date: 11/15/2017   Route: Intravitreal, Site: Right Eye, Waste: 0 mL  Post-op Post injection exam found visual acuity of at least counting fingers. The patient tolerated the procedure well. There were no complications. The patient received written and verbal post procedure care education.                 ASSESSMENT/PLAN:    ICD-10-CM   1. Exudative age-related macular degeneration of right eye with active choroidal neovascularization (HCC) H35.3211 OCT, Retina - OU - Both Eyes    Intravitreal Injection, Pharmacologic Agent - OD - Right Eye    Bevacizumab (AVASTIN) SOLN 1.25 mg  2. Retinal edema H35.81 OCT, Retina - OU - Both Eyes  3. Intermediate stage nonexudative age-related macular degeneration of left eye H35.3122   4. 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 Posterior vitreous detachment of both eyes H43.813   6. Pseudophakia of both eyes Z96.1   7. PCO (posterior capsular opacification), bilateral H26.493     1, 2. Exudative age related macular degeneration, OD  - peripapillary CNVM with +subretinal heme and edema, nasal disc   - S/P IVA OD #1 (05.15.19), #2 (07.01.19), #3 (08.13.19)  - OCT shows drusen and low-lying PED without IRF/SRF in macula; but  peripapillary IRF/SRF overlying PED/SRHM corresponding to subretinal heme OD -- interval improvement in IRF/SRF/PED -- now just cystic changes - delayed follow up (from 08.13.19 to 09.30.19) due to radiation treatments for prostate cancer - discussed prn vs treat and extend strategies for anti-VEGF therapy - pt wishes to proceed with IVA #4 OD today (09.30.19) and extension to 10-12 wks - RBA of procedure discussed, questions answered - informed consent obtained and signed - see procedure note - F/U 10-12 weeks  3. Age related macular degeneration, non-exudative, OS  - The incidence, anatomy, and pathology of dry AMD, risk of progression, and the AREDS and AREDS 2 study including smoking risks discussed with patient.  - continue to monitor  4. Choroidal nevus OS- - flat pigmented lesion along sup temp arcade - no suspcious features -- low concern for choroidal melanoma - monitor  5. PVD / vitreous syneresis OU  Discussed findings and prognosis  No RT or RD on 360 exam  Reviewed s/s of RT/RD  Strict return precautions for any such RT/RD signs/symptoms  6. Pseudophakia OU  - s/p CE/IOL OU by Dr. H10.13.19in 2016  - doing well  - monitor  7. PCO OU -- OD>OS - S/P YAG cap OD (05.29.19) - looks great with PC nicely open OD - OS not yet visually significant - monitor    Ophthalmic Meds Ordered this visit:  Meds ordered this encounter  Medications  . Bevacizumab (AVASTIN) SOLN 1.25 mg  Return in about 12 weeks (around 01/05/2018) for F/U Exu AMD OD, DFE, OCT.  There are no Patient Instructions on file for this visit.   Explained the diagnoses, plan, and follow up with the patient and they expressed understanding.  Patient expressed understanding of the importance of proper follow up care.   This document serves as a record of services personally performed by Gardiner Sleeper, MD, PhD. It was created on their behalf by Ernest Mallick, OA, an ophthalmic assistant. The  creation of this record is the provider's dictation and/or activities during the visit.    Electronically signed by: Ernest Mallick, OA  09.27.19 4:48 PM     Gardiner Sleeper, M.D., Ph.D. Diseases & Surgery of the Retina and Vitreous [facility]  I have reviewed the above documentation for accuracy and completeness, and I agree with the above. Gardiner Sleeper, M.D., Ph.D. 10/13/17 4:52 PM     Abbreviations: M myopia (nearsighted); A astigmatism; H hyperopia (farsighted); P presbyopia; Mrx spectacle prescription;  CTL contact lenses; OD right eye; OS left eye; OU both eyes  XT exotropia; ET esotropia; PEK punctate epithelial keratitis; PEE punctate epithelial erosions; DES dry eye syndrome; MGD meibomian gland dysfunction; ATs artificial tears; PFAT's preservative free artificial tears; [place] nuclear sclerotic cataract; PSC posterior subcapsular cataract; ERM epi-retinal membrane; PVD posterior vitreous detachment; RD retinal detachment; DM diabetes mellitus; DR diabetic retinopathy; NPDR non-proliferative diabetic retinopathy; PDR proliferative diabetic retinopathy; CSME clinically significant macular edema; DME diabetic macular edema; dbh dot blot hemorrhages; CWS cotton wool spot; POAG primary open angle glaucoma; C/D cup-to-disc ratio; HVF humphrey visual field; GVF goldmann visual field; OCT optical coherence tomography; IOP intraocular pressure; BRVO Branch retinal vein occlusion; CRVO central retinal vein occlusion; CRAO central retinal artery occlusion; BRAO branch retinal artery occlusion; RT retinal tear; SB scleral buckle; PPV pars plana vitrectomy; VH Vitreous hemorrhage; PRP panretinal laser photocoagulation; IVK intravitreal kenalog; VMT vitreomacular traction; MH Macular hole;  NVD neovascularization of the disc; NVE neovascularization elsewhere; AREDS age related eye disease study; ARMD age related macular degeneration; POAG primary open angle glaucoma; EBMD  epithelial/anterior basement membrane dystrophy; ACIOL anterior chamber intraocular lens; IOL intraocular lens; PCIOL posterior chamber intraocular lens; Phaco/IOL phacoemulsification with intraocular lens placement; [place] photorefractive keratectomy; LASIK laser assisted in situ keratomileusis; HTN hypertension; DM diabetes mellitus; COPD chronic obstructive pulmonary disease

## 2017-10-13 ENCOUNTER — Encounter (INDEPENDENT_AMBULATORY_CARE_PROVIDER_SITE_OTHER): Payer: Self-pay | Admitting: Ophthalmology

## 2017-10-13 ENCOUNTER — Ambulatory Visit (INDEPENDENT_AMBULATORY_CARE_PROVIDER_SITE_OTHER): Payer: Medicare HMO | Admitting: Ophthalmology

## 2017-10-13 ENCOUNTER — Encounter: Payer: Self-pay | Admitting: Radiation Oncology

## 2017-10-13 DIAGNOSIS — H26493 Other secondary cataract, bilateral: Secondary | ICD-10-CM | POA: Diagnosis not present

## 2017-10-13 DIAGNOSIS — H3581 Retinal edema: Secondary | ICD-10-CM

## 2017-10-13 DIAGNOSIS — H43813 Vitreous degeneration, bilateral: Secondary | ICD-10-CM | POA: Diagnosis not present

## 2017-10-13 DIAGNOSIS — H353211 Exudative age-related macular degeneration, right eye, with active choroidal neovascularization: Secondary | ICD-10-CM

## 2017-10-13 DIAGNOSIS — D3132 Benign neoplasm of left choroid: Secondary | ICD-10-CM

## 2017-10-13 DIAGNOSIS — H353122 Nonexudative age-related macular degeneration, left eye, intermediate dry stage: Secondary | ICD-10-CM

## 2017-10-13 DIAGNOSIS — Z961 Presence of intraocular lens: Secondary | ICD-10-CM

## 2017-10-13 MED ORDER — BEVACIZUMAB CHEMO INJECTION 1.25MG/0.05ML SYRINGE FOR KALEIDOSCOPE
1.2500 mg | INTRAVITREAL | Status: DC
  Administered 2017-10-13: 1.25 mg via INTRAVITREAL

## 2017-10-13 NOTE — Progress Notes (Signed)
  Radiation Oncology         [phone] ________________________________  Name: George [name] MRN: [id number]  Date: 10/13/2017  DOB: [birth year]  End of Treatment Note  Diagnosis:   72 y.o. 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  Indication for treatment:  Curative, Definitive Radiotherapy       Radiation treatment dates:   09/01/17 - 10/06/17  Site/dose:   The prostate was treated to 45 Gy in 25 fractions of 1.8 Gy preceeding a planned prostate seed boost to add an additional 110 Gy for a nominal total dose of 155 Gy.  Beams/energy:   The patient was treated with IMRT using volumetric arc therapy delivering 6 MV X-rays to clockwise and counterclockwise circumferential arcs with a 90 degree collimator offset to avoid dose scalloping.  Image guidance was performed with daily cone beam CT prior to each fraction to align to gold markers in the prostate and assure proper bladder and rectal fill volumes.  Immobilization was achieved with BodyFix custom mold.  Narrative: The patient tolerated radiation treatment relatively well.  He denied pain, hematuria, and dysuria but did experience increased nocturia x3-4 throughout treatment as well as moderate to severe fatigue that improved to mild fatigue by the end of treatment. He reported increasing diarrhea and an episode of constipation, both of which were relieved with imodium. By the end of treatment, he reported some leakage and urgency with urination, weak stream, and only emptying his bladder sometimes.   Plan: The patient has completed external beam radiation treatment and will proceed as planned with radioactive seed implant boost.  We will see him back approximately 3 weeks following his brachytherapy procedure but I advised him to call or return sooner if he has any questions or concerns related to his recovery or treatment. ________________________________  Sheral Apley. Tammi Klippel, M.D.  This  document serves as a record of services personally performed by Tyler Pita, MD. It was created on his behalf by Wilburn Mylar, a trained medical scribe. The creation of this record is based on the scribe's personal observations and the provider's statements to them. This document has been checked and approved by the attending provider.

## 2017-10-16 ENCOUNTER — Other Ambulatory Visit: Payer: Self-pay | Admitting: Urology

## 2017-10-20 DIAGNOSIS — J069 Acute upper respiratory infection, unspecified: Secondary | ICD-10-CM | POA: Diagnosis not present

## 2017-10-23 DIAGNOSIS — I10 Essential (primary) hypertension: Secondary | ICD-10-CM | POA: Diagnosis not present

## 2017-10-23 DIAGNOSIS — Z6828 Body mass index (BMI) 28.0-28.9, adult: Secondary | ICD-10-CM | POA: Diagnosis not present

## 2017-10-23 DIAGNOSIS — Z789 Other specified health status: Secondary | ICD-10-CM | POA: Diagnosis not present

## 2017-10-23 DIAGNOSIS — J069 Acute upper respiratory infection, unspecified: Secondary | ICD-10-CM | POA: Diagnosis not present

## 2017-10-23 DIAGNOSIS — C61 Malignant neoplasm of prostate: Secondary | ICD-10-CM | POA: Diagnosis not present

## 2017-10-23 DIAGNOSIS — Z299 Encounter for prophylactic measures, unspecified: Secondary | ICD-10-CM | POA: Diagnosis not present

## 2017-10-24 ENCOUNTER — Telehealth: Payer: Self-pay | Admitting: *Deleted

## 2017-10-24 ENCOUNTER — Ambulatory Visit: Payer: Medicare HMO | Admitting: Urology

## 2017-10-24 DIAGNOSIS — N403 Nodular prostate with lower urinary tract symptoms: Secondary | ICD-10-CM

## 2017-10-24 DIAGNOSIS — C61 Malignant neoplasm of prostate: Secondary | ICD-10-CM | POA: Diagnosis not present

## 2017-10-24 DIAGNOSIS — R351 Nocturia: Secondary | ICD-10-CM

## 2017-10-24 NOTE — Telephone Encounter (Signed)
CALLED PATIENT TO INFORM OF IMPLANT DATE, LVM FOR A RETURN CALL 

## 2017-10-28 ENCOUNTER — Telehealth: Payer: Self-pay | Admitting: *Deleted

## 2017-10-28 NOTE — Telephone Encounter (Signed)
CALLED PATIENT TO INFORM OF IMPLANT DATE, SPOKE WITH PATIENT AND HE IS AWARE OF THIS DATE 

## 2017-10-29 ENCOUNTER — Telehealth: Payer: Self-pay | Admitting: *Deleted

## 2017-10-29 NOTE — Telephone Encounter (Signed)
CALLED PATIENT TO INFORM OF APPT. FOR CHEST X-RAY ON 11-03-17 - ARRIVAL TIME - 1:45 PM @ [facility] ADMITTING, SPOKE WITH PATIENT'S WIFE AND SHE IS AWARE OF THIS APPT.

## 2017-10-30 ENCOUNTER — Ambulatory Visit: Payer: Self-pay | Admitting: Urology

## 2017-10-31 ENCOUNTER — Telehealth: Payer: Self-pay | Admitting: "Endocrinology

## 2017-10-31 NOTE — Telephone Encounter (Signed)
George [name] is needing a refill on NOVOLOG FLEXPEN 100 UNIT/ML FlexPen  He is completely out at this time, please advise?

## 2017-10-31 NOTE — Telephone Encounter (Signed)
He can come and get a sample. He has to return for a visit with labs.

## 2017-11-03 ENCOUNTER — Other Ambulatory Visit: Payer: Self-pay | Admitting: "Endocrinology

## 2017-11-03 ENCOUNTER — Ambulatory Visit (HOSPITAL_COMMUNITY)
Admission: RE | Admit: 2017-11-03 | Discharge: 2017-11-03 | Disposition: A | Discharge status: Home / Self Care | Payer: Medicare HMO | Source: Ambulatory Visit | Attending: Urology | Admitting: Urology

## 2017-11-03 ENCOUNTER — Encounter (HOSPITAL_COMMUNITY)
Admission: RE | Admit: 2017-11-03 | Discharge: 2017-11-03 | Disposition: A | Discharge status: Home / Self Care | Payer: Medicare HMO | Source: Ambulatory Visit | Attending: Urology | Admitting: Urology

## 2017-11-03 DIAGNOSIS — Z951 Presence of aortocoronary bypass graft: Secondary | ICD-10-CM | POA: Insufficient documentation

## 2017-11-03 DIAGNOSIS — I440 Atrioventricular block, first degree: Secondary | ICD-10-CM | POA: Diagnosis not present

## 2017-11-03 DIAGNOSIS — C61 Malignant neoplasm of prostate: Secondary | ICD-10-CM | POA: Insufficient documentation

## 2017-11-03 NOTE — Telephone Encounter (Signed)
Patient is aware of the recommendation 

## 2017-11-04 ENCOUNTER — Encounter: Payer: Self-pay | Admitting: Urology

## 2017-11-04 DIAGNOSIS — E119 Type 2 diabetes mellitus without complications: Secondary | ICD-10-CM | POA: Diagnosis not present

## 2017-11-04 DIAGNOSIS — I10 Essential (primary) hypertension: Secondary | ICD-10-CM | POA: Diagnosis not present

## 2017-11-04 NOTE — Progress Notes (Signed)
Patient had fiducial markers and SpaceOAR gel placed 08/18/17 prior to starting 5 week course of EBRT which he recently completed.  He was unable to tolerate MRI prostate despite Ativan and unable to do limited prostate MRI on open scanner.  Since it is unneccessary to do a full MRI pelvis w/w/o for verification of SpaceOAR the decision was made to forego prostate MRI alltogether- per Dr. Tammi Klippel.   George [name], MMS, PA-C [place] [facility] [place]: [phone]  Fax: [phone]

## 2017-11-17 ENCOUNTER — Telehealth: Payer: Self-pay | Admitting: *Deleted

## 2017-11-17 NOTE — Telephone Encounter (Signed)
CALLED PATIENT TO REMIND OF LAB APPT. FOR 11-18-17 - ARRIVAL TIME - 12:45 PM @ [facility] ADMITTING, SPOKE WITH PATIENT AND HE IS AWARE OF THIS APPT.

## 2017-11-17 NOTE — Telephone Encounter (Signed)
CALLED PATIENT'S WIFE, LVM FOR A RETURN CALL

## 2017-11-18 ENCOUNTER — Encounter (HOSPITAL_BASED_OUTPATIENT_CLINIC_OR_DEPARTMENT_OTHER): Payer: Self-pay | Admitting: *Deleted

## 2017-11-18 ENCOUNTER — Encounter (HOSPITAL_COMMUNITY)
Admission: RE | Admit: 2017-11-18 | Discharge: 2017-11-18 | Disposition: A | Discharge status: Home / Self Care | Payer: Medicare HMO | Source: Ambulatory Visit | Attending: Urology | Admitting: Urology

## 2017-11-18 ENCOUNTER — Encounter (HOSPITAL_COMMUNITY): Payer: Self-pay

## 2017-11-18 ENCOUNTER — Other Ambulatory Visit: Payer: Self-pay

## 2017-11-18 DIAGNOSIS — Z01812 Encounter for preprocedural laboratory examination: Secondary | ICD-10-CM | POA: Diagnosis not present

## 2017-11-18 LAB — COMPREHENSIVE METABOLIC PANEL
ALBUMIN: 3.9 g/dL (ref 3.5–5.0)
ALT: 36 U/L (ref 0–44)
ANION GAP: 7 (ref 5–15)
AST: 31 U/L (ref 15–41)
Alkaline Phosphatase: 101 U/L (ref 38–126)
BUN: 22 mg/dL (ref 8–23)
CALCIUM: 9.2 mg/dL (ref 8.9–10.3)
CHLORIDE: 102 mmol/L (ref 98–111)
CO2: 27 mmol/L (ref 22–32)
CREATININE: 0.83 mg/dL (ref 0.61–1.24)
GFR calc Af Amer: >60 mL/min (ref >60)
GFR calc non Af Amer: >60 mL/min (ref >60)
Glucose, Bld: 341 mg/dL — ABNORMAL HIGH (ref 70–99)
POTASSIUM: 4.5 mmol/L (ref 3.5–5.1)
SODIUM: 136 mmol/L (ref 135–145)
Total Bilirubin: 0.6 mg/dL (ref 0.3–1.2)
Total Protein: 6.8 g/dL (ref 6.5–8.1)

## 2017-11-18 LAB — CBC
HCT: 35.7 % — ABNORMAL LOW (ref 39.0–52.0)
Hemoglobin: 11.8 g/dL — ABNORMAL LOW (ref 13.0–17.0)
MCH: 29 pg (ref 26.0–34.0)
MCHC: 33.1 g/dL (ref 30.0–36.0)
MCV: 87.7 fL (ref 80.0–100.0)
Platelets: 178 10*3/uL (ref 150–400)
RBC: 4.07 MIL/uL — ABNORMAL LOW (ref 4.22–5.81)
RDW: 14.4 % (ref 11.5–15.5)
WBC: 3.2 10*3/uL — AB (ref 4.0–10.5)
nRBC: 0 % (ref 0.0–0.2)

## 2017-11-18 LAB — PROTIME-INR
INR: 0.97
PROTHROMBIN TIME: 12.8 s (ref 11.4–15.2)

## 2017-11-18 LAB — APTT: APTT: 27 s (ref 24–36)

## 2017-11-18 NOTE — Progress Notes (Signed)
Your procedure is scheduled on  11-25-17  Report to [facility] AM   Call this number if you have problems the morning of surgery  :[phone].   OUR ADDRESS IS [address]  WE ARE LOCATED IN THE [place]                                   [facility].                                     REMEMBER:  DO NOT EAT FOOD OR DRINK LIQUIDS AFTER MIDNIGHT .  TAKE THESE MEDICATIONS MORNING OF SURGERY WITH A SIP OF WATER:  ATORVASTATIN, CLONIDINE, METOPROLOL, AMLODIPINE, TAKE 1/2 DOSE OF LANTUS INSULIN  ( TAKE 40 UNITS) AT BEDTIME NIGHT BEFORE SURGERY.   DRIVER WIFE CARLETTA I                                   DO NOT WEAR JEWERLY, MAKE UP, OR NAIL POLISH,  DO NOT WEAR LOTIONS, POWDERS, PERFUMES OR DEODORANT. DO NOT SHAVE FOR 24 HOURS PRIOR TO DAY OF SURGERY. MEN MAY SHAVE FACE AND NECK. CONTACTS, GLASSES, OR DENTURES MAY NOT BE WORN TO SURGERY.                                    [facility] IS NOT RESPONSIBLE  FOR ANY BELONGINGS.                                                                    Marland Kitchen

## 2017-11-18 NOTE — Progress Notes (Signed)
cmet results faxed to dr Jeffie Pollock by epic

## 2017-11-18 NOTE — Progress Notes (Addendum)
Spoke face to face with Lecil Dripps Npo after midnight, arrive 900 am [facility] Take the following meds with sip of water: atorvastatin, clonidine, metoprolol, amlodipine, take 1/2 dose hs lantus night before surgery. Fleets enema am of surgery. Records on chart/epic: chest xray 11-03-17, ekg 11-03-17, cbc, cmet, pt, ptt 11-18-17, lov note dr Berenice Primas cardiology 01-02-17. Wife carlotta driver Has surgery orders in epic

## 2017-11-18 NOTE — Progress Notes (Signed)
Spoke with patient by phone and patient aware to do fleets enema am of surgery

## 2017-11-24 ENCOUNTER — Telehealth: Payer: Self-pay | Admitting: *Deleted

## 2017-11-24 ENCOUNTER — Encounter (HOSPITAL_BASED_OUTPATIENT_CLINIC_OR_DEPARTMENT_OTHER): Payer: Self-pay | Admitting: *Deleted

## 2017-11-24 NOTE — Telephone Encounter (Signed)
CALLED PATIENT TO REMIND OF IMPLANT FOR 11-25-17, SPOKE WITH PATIENT AND HE IS AWARE OF THIS PROCEDURE

## 2017-11-24 NOTE — H&P (Signed)
CC: I have prostate cancer.  HPI: George [name] is a 72 year-old male established patient who is here evaluation for treatment of prostate cancer.  His prostate cancer was diagnosed 01/24/2017. He does have the pathology report from his biopsy. His cancer was diagnosed by Dr. Irine Seal. His PSA at his time of diagnosis was 6.5.   [name] returns today if f/u for firmagon for further management of his T2a N0 M0 Gleason 8(5+3) cancer in a 44ml prostate. he had 9/12 cores positive. He was started on ADT earlier this year and then got SpaceOAR and gold seeds in 8/19 and he completed the Radiation therapy 2 1/2 weeks ago. He is scheduled for a seed boost on 11/25/17. He is voiding well with an IPSS of 7. He has no hematuria or dysuria. He has no GI complaints. He some fatigue. He continues to have hot flashes. He is due for firmagon today.        AUA Symptom Score: Less than 20% of the time he has the sensation of not emptying his bladder completely when finished urinating. Less than 20% of the time he has to urinate again fewer than two hours after he has finished urinating. He does not have to stop and start again several times when he urinates. Less than 20% of the time he finds it difficult to postpone urination. Less than 20% of the time he has a weak urinary stream. Less than 20% of the time he has to push or strain to begin urination. He has to get up to urinate 2 times from the time he goes to bed until the time he gets up in the morning.   Calculated AUA Symptom Score: 7    QOL Score: He would feel mostly satisfied if he had to live with his urinary condition the way it is now for the rest of his life.   Calculated QOL Symptom Score: 2    ALLERGIES: None   MEDICATIONS: Metformin Hcl 500 mg tablet  Metoprolol Succinate 50 mg tablet, extended release 24 hr  Amlodipine Besylate 5 mg tablet tablet  Atorvastatin Calcium 20 mg tablet tablet  Clonidine  Lantus  Losartan Potassium 100 mg  tablet tablet  Nitroglycerin 0.4 mg tablet, sublingual  Novalog     GU PSH: Circumcision PLACE RT DEVICE/MARKER, PROS - 08/18/2017 Prostate Needle Biopsy - 01/24/2017 Transperineal Plmt Biodegradable Matrl 1/Mlt Njx - 08/18/2017    NON-GU PSH: CABG (coronary artery bypass grafting) Eye Surgery Procedure Surgical Pathology, Gross And Microscopic Examination For Prostate Needle - 01/24/2017    GU PMH: Prostate Cancer - 08/18/2017, - 06/20/2017 (Stable), He will return in a month in [place] to continue firmagon which I think will be best with his cardiovascular toxicity. I will await further scheduling guidance from Dr. Tammi Klippel. , - 05/23/2017, T2a N0 M0 Gleason 8(5+3) prostate cancer in a 73ml prostate. I have discussed the options in detail and with his high risk disease and LUT's I think surgical therapy would be a reasonable consideration but he is also a candidate for ADT and either EXRT alone or with a seed boost. 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 I am going to have him see Dr. Alinda Money for consideration of surgical therapy. , - 03/19/2017 ED due to arterial insufficiency, He has a SHIM of 6. - 03/19/2017, - 01/03/2017 Incomplete bladder emptying - 03/19/2017 Elevated PSA, With a worrisome lesion on [place]. - 01/24/2017,  He has an elevated PSA with a prostate nodule. I will repeat a PSA today and get him set up for a prostate [place] and biopsy. I have reviewed the risks of bleeding, infection and voiding difficulty. Levaquin sent. , - 01/03/2017 Nocturia - 01/03/2017 Prostate nodule w/ LUTS - 01/03/2017    NON-GU PMH: Arthritis Diabetes Type 2 Heart disease, unspecified Hypertension    FAMILY HISTORY: Diabetes - Runs in Family Lung Cancer - Runs in Family   SOCIAL HISTORY: Marital Status: Married Preferred Language: English; Ethnicity: Not Hispanic Or Latino; Race: White Current Smoking Status: Patient has never smoked.   Tobacco Use Assessment  Completed: Used Tobacco in last 30 days? Has never drank.  Drinks 2 caffeinated drinks per day. Patient's occupation is/was logger.    REVIEW OF SYSTEMS:    GU Review Male:   Patient denies frequent urination, hard to postpone urination, burning/ pain with urination, get up at night to urinate, leakage of urine, stream starts and stops, trouble starting your stream, have to strain to urinate , erection problems, and penile pain.  Gastrointestinal (Upper):   Patient denies nausea, vomiting, and indigestion/ heartburn.  Gastrointestinal (Lower):   Patient denies constipation and diarrhea.  Constitutional:   Patient reports fatigue. Patient denies fever, night sweats, and weight loss.  Skin:   Patient denies skin rash/ lesion and itching.  Eyes:   Patient denies blurred vision and double vision.  Ears/ Nose/ Throat:   Patient denies sore throat and sinus problems.  Hematologic/Lymphatic:   Patient denies swollen glands and easy bruising.  Cardiovascular:   Patient denies leg swelling and chest pains.  Respiratory:   Patient denies cough and shortness of breath.  Endocrine:   Hot flashes. Patient denies excessive thirst.  Musculoskeletal:   Patient denies back pain and joint pain.  Neurological:   Patient denies headaches and dizziness.  Psychologic:   Patient denies depression and anxiety.   VITAL SIGNS:      10/24/2017 03:59 PM  BP 159/83 mmHg  Pulse 80 /min  Temperature 98.4 F / 36.8 C   MULTI-SYSTEM PHYSICAL EXAMINATION:    Constitutional: Well-nourished. No physical deformities. Normally developed. Good grooming.  Respiratory: No labored breathing, no use of accessory muscles. CTA  Cardiovascular: Normal temperature, RRR without murmur     PAST DATA REVIEWED:  Source Of History:  Patient  Records Review:   AUA Symptom Score  Urine Test Review:   Urinalysis   06/17/17 01/03/17 08/19/16  PSA  Total PSA 1.3 ng/dl 6.5 ng/dl 6.6 ng/dl  % Free PSA  11 %     06/17/17  Hormones   Testosterone, Total < 10 pg/dL    PROCEDURES:          Urinalysis - 81003 Dipstick Dipstick Cont'd  Specimen: Voided Bilirubin: Neg  Color: Yellow Ketones: Neg  Appearance: Clear Blood: Trace Intact  Specific Gravity: 1.015 Protein: Neg  pH: 5.0 Urobilinogen: 0.2  Glucose: 3+ Nitrites: Neg    Leukocyte Esterase: Neg         Firmagon 80mg  - [id number], [id number] tolerated injection with no difficulty.   Qty: 80 Adm. By: Valentina Lucks  Unit: mg Lot No [id number]  Route: SQ Exp. Date 04/13/2020  Freq: Q1M Mfgr.:   Site: RLQ   ASSESSMENT:      ICD-10 Details  1 GU:   Prostate Cancer - C61 He is scheduled for the seed boost on 11/25/17. I reviewed the procedure and risks with him for that.   2  Prostate nodule w/ LUTS - N40.3 He is voiding better since therapy.   3   Nocturia - R35.1    PLAN:           Schedule Labs: 3 Months - PSA    3 Months - Total Testosterone  Return Visit/Planned Activity: 1 Month - Nurse Visit             Note: Firmagon 80mg  monthly.   Return Visit/Planned Activity: 3 Months - Office Visit  Procedure: 10/24/2017 at [facility] [place] 80mg  (Chemo Hormon Antineopl Sq/im) [phone], [phone]          Document Letter(s):  Created for Patient: Clinical Summary

## 2017-11-25 ENCOUNTER — Ambulatory Visit (HOSPITAL_BASED_OUTPATIENT_CLINIC_OR_DEPARTMENT_OTHER)
Admission: RE | Admit: 2017-11-25 | Discharge: 2017-11-25 | Disposition: A | Discharge status: Home / Self Care | Payer: Medicare HMO | Source: Ambulatory Visit | Attending: Urology | Admitting: Urology

## 2017-11-25 ENCOUNTER — Ambulatory Visit (HOSPITAL_BASED_OUTPATIENT_CLINIC_OR_DEPARTMENT_OTHER): Payer: Medicare HMO | Admitting: Certified Registered"

## 2017-11-25 ENCOUNTER — Encounter (HOSPITAL_BASED_OUTPATIENT_CLINIC_OR_DEPARTMENT_OTHER): Payer: Self-pay | Admitting: *Deleted

## 2017-11-25 ENCOUNTER — Encounter (HOSPITAL_BASED_OUTPATIENT_CLINIC_OR_DEPARTMENT_OTHER)
Admission: RE | Disposition: A | Discharge status: Home / Self Care | Payer: Self-pay | Source: Ambulatory Visit | Attending: Urology

## 2017-11-25 ENCOUNTER — Ambulatory Visit (HOSPITAL_COMMUNITY): Payer: Medicare HMO

## 2017-11-25 DIAGNOSIS — Z951 Presence of aortocoronary bypass graft: Secondary | ICD-10-CM | POA: Insufficient documentation

## 2017-11-25 DIAGNOSIS — N403 Nodular prostate with lower urinary tract symptoms: Secondary | ICD-10-CM | POA: Diagnosis not present

## 2017-11-25 DIAGNOSIS — E119 Type 2 diabetes mellitus without complications: Secondary | ICD-10-CM | POA: Diagnosis not present

## 2017-11-25 DIAGNOSIS — K219 Gastro-esophageal reflux disease without esophagitis: Secondary | ICD-10-CM | POA: Diagnosis not present

## 2017-11-25 DIAGNOSIS — Z79899 Other long term (current) drug therapy: Secondary | ICD-10-CM | POA: Insufficient documentation

## 2017-11-25 DIAGNOSIS — R351 Nocturia: Secondary | ICD-10-CM | POA: Insufficient documentation

## 2017-11-25 DIAGNOSIS — I10 Essential (primary) hypertension: Secondary | ICD-10-CM | POA: Diagnosis not present

## 2017-11-25 DIAGNOSIS — I2510 Atherosclerotic heart disease of native coronary artery without angina pectoris: Secondary | ICD-10-CM | POA: Insufficient documentation

## 2017-11-25 DIAGNOSIS — Z794 Long term (current) use of insulin: Secondary | ICD-10-CM | POA: Insufficient documentation

## 2017-11-25 DIAGNOSIS — C61 Malignant neoplasm of prostate: Secondary | ICD-10-CM | POA: Insufficient documentation

## 2017-11-25 HISTORY — DX: Dermatitis, unspecified: L30.9

## 2017-11-25 HISTORY — DX: Personal history of other mental and behavioral disorders: Z86.59

## 2017-11-25 HISTORY — PX: RADIOACTIVE SEED IMPLANT: SHX5150

## 2017-11-25 HISTORY — DX: Type 2 diabetes mellitus without complications: E11.9

## 2017-11-25 LAB — GLUCOSE, CAPILLARY
GLUCOSE-CAPILLARY: 251 mg/dL — AB (ref 70–99)
GLUCOSE-CAPILLARY: 237 mg/dL — AB (ref 70–99)

## 2017-11-25 SURGERY — INSERTION, RADIATION SOURCE, PROSTATE
Anesthesia: General

## 2017-11-25 MED ORDER — FLEET ENEMA 7-19 GM/118ML RE ENEM
1.0000 | ENEMA | Freq: Once | RECTAL | Status: DC
  Filled 2017-11-25: qty 1

## 2017-11-25 MED ORDER — HYDROCODONE-ACETAMINOPHEN 5-325 MG PO TABS: 1.0000 | ORAL_TABLET | Freq: Four times a day (QID) | ORAL | 0 refills | Status: DC | PRN

## 2017-11-25 MED ORDER — LIDOCAINE 2% (20 MG/ML) 5 ML SYRINGE
INTRAMUSCULAR | Status: AC
  Filled 2017-11-25: qty 5

## 2017-11-25 MED ORDER — OXYCODONE HCL 5 MG/5ML PO SOLN
5.0000 mg | Freq: Once | ORAL | Status: DC | PRN
  Filled 2017-11-25: qty 5

## 2017-11-25 MED ORDER — IOHEXOL 300 MG/ML  SOLN
INTRAMUSCULAR | Status: DC | PRN
  Administered 2017-11-25: 7 mL

## 2017-11-25 MED ORDER — ONDANSETRON HCL 4 MG/2ML IJ SOLN
INTRAMUSCULAR | Status: DC | PRN
  Administered 2017-11-25: 4 mg via INTRAVENOUS

## 2017-11-25 MED ORDER — CIPROFLOXACIN IN D5W 400 MG/200ML IV SOLN
400.0000 mg | INTRAVENOUS | Status: AC
  Administered 2017-11-25: 400 mg via INTRAVENOUS
  Filled 2017-11-25: qty 200

## 2017-11-25 MED ORDER — OXYCODONE HCL 5 MG PO TABS
5.0000 mg | ORAL_TABLET | Freq: Once | ORAL | Status: DC | PRN
  Filled 2017-11-25: qty 1

## 2017-11-25 MED ORDER — SODIUM CHLORIDE 0.9 % IV SOLN
250.0000 mL | INTRAVENOUS | Status: DC | PRN
  Filled 2017-11-25: qty 250

## 2017-11-25 MED ORDER — PROPOFOL 10 MG/ML IV BOLUS
INTRAVENOUS | Status: DC | PRN
  Administered 2017-11-25: 200 mg via INTRAVENOUS

## 2017-11-25 MED ORDER — OXYCODONE HCL 5 MG PO TABS
5.0000 mg | ORAL_TABLET | ORAL | Status: DC | PRN
  Filled 2017-11-25: qty 2

## 2017-11-25 MED ORDER — LACTATED RINGERS IV SOLN
INTRAVENOUS | Status: DC
  Administered 2017-11-25: 11:00:00 via INTRAVENOUS
  Filled 2017-11-25: qty 1000

## 2017-11-25 MED ORDER — SODIUM CHLORIDE 0.9 % IV SOLN
INTRAVENOUS | Status: AC | PRN
  Administered 2017-11-25: 1000 mL

## 2017-11-25 MED ORDER — LIDOCAINE 2% (20 MG/ML) 5 ML SYRINGE
INTRAMUSCULAR | Status: DC | PRN
  Administered 2017-11-25: 100 mg via INTRAVENOUS

## 2017-11-25 MED ORDER — SODIUM CHLORIDE 0.9% FLUSH
3.0000 mL | INTRAVENOUS | Status: DC | PRN
  Filled 2017-11-25: qty 3

## 2017-11-25 MED ORDER — HYDROMORPHONE HCL 1 MG/ML IJ SOLN
0.2500 mg | INTRAMUSCULAR | Status: DC | PRN
  Administered 2017-11-25: 0.5 mg via INTRAVENOUS
  Filled 2017-11-25: qty 0.5

## 2017-11-25 MED ORDER — MORPHINE SULFATE (PF) 2 MG/ML IV SOLN
2.0000 mg | INTRAVENOUS | Status: DC | PRN
  Filled 2017-11-25: qty 1

## 2017-11-25 MED ORDER — ONDANSETRON HCL 4 MG/2ML IJ SOLN
INTRAMUSCULAR | Status: AC
  Filled 2017-11-25: qty 2

## 2017-11-25 MED ORDER — ACETAMINOPHEN 325 MG PO TABS
650.0000 mg | ORAL_TABLET | ORAL | Status: DC | PRN
  Filled 2017-11-25: qty 2

## 2017-11-25 MED ORDER — EPHEDRINE 5 MG/ML INJ
INTRAVENOUS | Status: AC
  Filled 2017-11-25: qty 10

## 2017-11-25 MED ORDER — CIPROFLOXACIN IN D5W 400 MG/200ML IV SOLN
INTRAVENOUS | Status: AC
  Filled 2017-11-25: qty 200

## 2017-11-25 MED ORDER — FENTANYL CITRATE (PF) 100 MCG/2ML IJ SOLN
INTRAMUSCULAR | Status: AC
  Filled 2017-11-25: qty 2

## 2017-11-25 MED ORDER — HYDROMORPHONE HCL 1 MG/ML IJ SOLN
INTRAMUSCULAR | Status: AC
  Filled 2017-11-25: qty 1

## 2017-11-25 MED ORDER — PROPOFOL 10 MG/ML IV BOLUS
INTRAVENOUS | Status: AC
  Filled 2017-11-25: qty 20

## 2017-11-25 MED ORDER — PROMETHAZINE HCL 25 MG/ML IJ SOLN
6.2500 mg | INTRAMUSCULAR | Status: DC | PRN
  Filled 2017-11-25: qty 1

## 2017-11-25 MED ORDER — FENTANYL CITRATE (PF) 100 MCG/2ML IJ SOLN
INTRAMUSCULAR | Status: DC | PRN
  Administered 2017-11-25: 50 ug via INTRAVENOUS
  Administered 2017-11-25 (×2): 25 ug via INTRAVENOUS

## 2017-11-25 MED ORDER — SODIUM CHLORIDE 0.9% FLUSH
3.0000 mL | Freq: Two times a day (BID) | INTRAVENOUS | Status: DC
  Filled 2017-11-25: qty 3

## 2017-11-25 MED ORDER — ACETAMINOPHEN 650 MG RE SUPP
650.0000 mg | RECTAL | Status: DC | PRN
  Filled 2017-11-25: qty 1

## 2017-11-25 SURGICAL SUPPLY — 43 items
BAG URINE DRAINAGE (UROLOGICAL SUPPLIES) ×3 IMPLANT
BLADE CLIPPER SURG (BLADE) ×3 IMPLANT
CATH FOLEY 2WAY SLVR  5CC 16FR (CATHETERS) ×2
CATH FOLEY 2WAY SLVR 5CC 16FR (CATHETERS) ×1 IMPLANT
CATH ROBINSON RED A/P 16FR (CATHETERS) IMPLANT
CATH ROBINSON RED A/P 20FR (CATHETERS) ×3 IMPLANT
CLOTH BEACON ORANGE TIMEOUT ST (SAFETY) ×3 IMPLANT
CONT SPECI 4OZ STER CLIK (MISCELLANEOUS) ×6 IMPLANT
COVER BACK TABLE 60X90IN (DRAPES) ×3 IMPLANT
COVER MAYO STAND STRL (DRAPES) ×3 IMPLANT
COVER WAND RF STERILE (DRAPES) ×1 IMPLANT
DRSG TEGADERM 4X4.75 (GAUZE/BANDAGES/DRESSINGS) ×3 IMPLANT
DRSG TEGADERM 8X12 (GAUZE/BANDAGES/DRESSINGS) ×3 IMPLANT
GAUZE SPONGE 4X4 12PLY STRL (GAUZE/BANDAGES/DRESSINGS) ×2 IMPLANT
GLOVE BIO SURGEON STRL SZ 6 (GLOVE) IMPLANT
GLOVE BIO SURGEON STRL SZ 6.5 (GLOVE) IMPLANT
GLOVE BIO SURGEON STRL SZ7 (GLOVE) IMPLANT
GLOVE BIO SURGEON STRL SZ8 (GLOVE) IMPLANT
GLOVE BIO SURGEONS STRL SZ 6.5 (GLOVE)
GLOVE BIOGEL PI IND STRL 6 (GLOVE) IMPLANT
GLOVE BIOGEL PI IND STRL 6.5 (GLOVE) IMPLANT
GLOVE BIOGEL PI IND STRL 7.0 (GLOVE) IMPLANT
GLOVE BIOGEL PI IND STRL 8 (GLOVE) IMPLANT
GLOVE BIOGEL PI INDICATOR 6 (GLOVE)
GLOVE BIOGEL PI INDICATOR 6.5 (GLOVE)
GLOVE BIOGEL PI INDICATOR 7.0 (GLOVE)
GLOVE BIOGEL PI INDICATOR 8 (GLOVE)
GLOVE ECLIPSE 8.0 STRL XLNG CF (GLOVE) ×4 IMPLANT
GLOVE SURG SS PI 8.0 STRL IVOR (GLOVE) ×6 IMPLANT
GOWN STRL REUS W/ TWL XL LVL3 (GOWN DISPOSABLE) ×1 IMPLANT
GOWN STRL REUS W/TWL XL LVL3 (GOWN DISPOSABLE) ×3
HOLDER FOLEY CATH W/STRAP (MISCELLANEOUS) ×3 IMPLANT
I-SEED AGX100 ×2 IMPLANT
IV NS 1000ML (IV SOLUTION) ×3
IV NS 1000ML BAXH (IV SOLUTION) ×1 IMPLANT
KIT TURNOVER CYSTO (KITS) ×3 IMPLANT
MARKER SKIN DUAL TIP RULER LAB (MISCELLANEOUS) ×3 IMPLANT
PACK CYSTO (CUSTOM PROCEDURE TRAY) ×3 IMPLANT
SUT BONE WAX W31G (SUTURE) IMPLANT
SYR 10ML LL (SYRINGE) ×4 IMPLANT
UNDERPAD 30X30 (UNDERPADS AND DIAPERS) ×6 IMPLANT
WATER STERILE IRR 3000ML UROMA (IV SOLUTION) ×3 IMPLANT
WATER STERILE IRR 500ML POUR (IV SOLUTION) ×3 IMPLANT

## 2017-11-25 NOTE — Anesthesia Preprocedure Evaluation (Signed)
Anesthesia Evaluation  Patient identified by MRN, date of birth, ID band Patient awake    Reviewed: Allergy & Precautions, H&P , NPO status , Patient's Chart, lab work & pertinent test results, reviewed documented beta blocker date and time   Airway Mallampati: II  TM Distance: >3 FB     Dental  (+) Poor Dentition, Chipped, Missing, Dental Advisory Given   Pulmonary shortness of breath and with exertion, Current Smoker, former smoker,    breath sounds clear to auscultation       Cardiovascular hypertension, Pt. on medications and Pt. on home beta blockers + CAD   Rhythm:Regular Rate:Normal     Neuro/Psych Anxiety    GI/Hepatic GERD  Medicated and Controlled,  Endo/Other  diabetes, Type 2, Insulin Dependent  Renal/GU      Musculoskeletal  (+) Arthritis , Osteoarthritis,    Abdominal   Peds  Hematology   Anesthesia Other Findings   Reproductive/Obstetrics                             Anesthesia Physical  Anesthesia Plan  ASA: III  Anesthesia Plan: General   Post-op Pain Management:    Induction: Intravenous  PONV Risk Score and Plan: 2 and Ondansetron and Midazolam  Airway Management Planned: LMA  Additional Equipment:   Intra-op Plan:   Post-operative Plan: Extubation in OR  Informed Consent: I have reviewed the patients History and Physical, chart, labs and discussed the procedure including the risks, benefits and alternatives for the proposed anesthesia with the patient or authorized representative who has indicated his/her understanding and acceptance.   Dental advisory given  Plan Discussed with:   Anesthesia Plan Comments:         Anesthesia Quick Evaluation

## 2017-11-25 NOTE — Interval H&P Note (Signed)
History and Physical Interval Note:  11/25/2017 10:20 AM  George [name]  has presented today for surgery, with the diagnosis of PROSTATE CANCER  The various methods of treatment have been discussed with the patient and family. After consideration of risks, benefits and other options for treatment, the patient has consented to  Procedure(s): RADIOACTIVE SEED IMPLANT/BRACHYTHERAPY IMPLANT (N/A) as a surgical intervention .  The patient's history has been reviewed, patient examined, no change in status, stable for surgery.  I have reviewed the patient's chart and labs.  Questions were answered to the patient's satisfaction.     Irine Seal

## 2017-11-25 NOTE — Discharge Instructions (Signed)
Brachytherapy for Prostate Cancer, Care After Refer to this sheet in the next few weeks. These instructions provide you with information on caring for yourself after your procedure. Your health care provider may also give you more specific instructions. Your treatment has been planned according to current medical practices, but problems sometimes occur. Call your health care provider if you have any problems or questions after your procedure. What can I expect after the procedure? The area behind the scrotum will probably be tender and bruised. For a short period of time you may have:  Difficulty passing urine. You may need a catheter for a few days to a month.  Blood in the urine or semen.  A feeling of constipation because of prostate swelling.  Frequent feeling of an urgent need to urinate.  For a long period of time you may have:  Inflammation of the rectum. This happens in about 2% of people who have the procedure.  Erection problems. These vary with age and occur in about 15-40% of men.  Difficulty urinating. This is caused by scarring in the urethra.  Diarrhea.  Follow these instructions at home:  Take medicines only as directed by your health care provider.  You will probably have a catheter in your bladder for several days. You will have blood in the urine bag and should drink a lot of fluids to keep it a light red color.  Keep all follow-up visits as directed by your health care provider. If you have a catheter, it will be removed during one of these visits.  Try not to sit directly on the area behind the scrotum. A soft cushion can decrease the discomfort. Ice packs may also be helpful for the discomfort. Do not put ice directly on the skin.  Shower and wash the area behind the scrotum gently. Do not sit in a tub.  If you have had the brachytherapy that uses the seeds, limit your close contact with children and pregnant women for 2 months because of the radiation still  in the prostate. After that period of time, the levels drop off quickly. Get help right away if:  You have a fever.  You have chills.  You have shortness of breath.  You have chest pain.  You have thick blood, like tomato juice, in the urine bag.  Your catheter is blocked so urine cannot get into the bag. Your bladder area or lower abdomen may be swollen.  There is excessive bleeding from your rectum. It is normal to have a little blood mixed with your stool.  There is severe discomfort in the treated area that does not go away with pain medicine.  You have abdominal discomfort.  You have severe nausea or vomiting.  You develop any new or unusual symptoms. This information is not intended to replace advice given to you by your health care provider. Make sure you discuss any questions you have with your health care provider. Document Released: 02/02/2010 Document Revised: 06/14/2015 Document Reviewed: 06/23/2012 Elsevier Interactive Patient Education  2017 [place] Instructions   Activity:    Rest for the remainder of the day.  Do not drive or operate equipment today.  You may resume normal  activities in a few days as instructed by your physician, without risk of harmful radiation exposure to those around you, provided you follow the time and distance precautions on the Radiation Oncology Instruction Sheet.   Meals: Drink plenty of lipuids and  eat light foods, such as gelatin or soup this evening .  You may return to normal meal plan tomorrow.  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  Special Instruction:   If any seeds are found, use tweezers to pick up seeds and place in a glass container of any kind and bring to your physician's office.  Call your physician if any of these symptoms occur:   Persistent or heavy bleeding  Urine stream diminishes or stops completely after catheter is  removed  Fever equal to or greater than 101 degrees F  Cloudy urine with a strong foul odor  Severe pain  You may feel some burning pain and/or hesitancy when you urinate after the catheter is removed.  These symptoms may increase over the next few weeks, but should diminish within forur to six weeks.  Applying moist heat to the lower abdomen or a hot tub bath may help relieve the pain.  If the discomfort becomes severe, please call your physician for additional medications.     Post Anesthesia Home Care Instructions  Activity: Get plenty of rest for the remainder of the day. A responsible individual must stay with you for 24 hours following the procedure.  For the next 24 hours, DO NOT: -Drive a car -Paediatric nurse -Drink alcoholic beverages -Take any medication unless instructed by your physician -Make any legal decisions or sign important papers.  Meals: Start with liquid foods such as gelatin or soup. Progress to regular foods as tolerated. Avoid greasy, spicy, heavy foods. If nausea and/or vomiting occur, drink only clear liquids until the nausea and/or vomiting subsides. Call your physician if vomiting continues.  Special Instructions/Symptoms: Your throat may feel dry or sore from the anesthesia or the breathing tube placed in your throat during surgery. If this causes discomfort, gargle with warm salt water. The discomfort should disappear within 24 hours.  If you had a scopolamine patch placed behind your ear for the management of post- operative nausea and/or vomiting:  1. The medication in the patch is effective for 72 hours, after which it should be removed.  Wrap patch in a tissue and discard in the trash. Wash hands thoroughly with soap and water. 2. You may remove the patch earlier than 72 hours if you experience unpleasant side effects which may include dry mouth, dizziness or visual disturbances. 3. Avoid touching the patch. Wash your hands with soap and water  after contact with the patch.

## 2017-11-25 NOTE — Transfer of Care (Signed)
Immediate Anesthesia Transfer of Care Note  Patient: George [name]  Procedure(s) Performed: Procedure(s) (LRB): RADIOACTIVE SEED IMPLANT/BRACHYTHERAPY IMPLANT (N/A)  Patient Location: PACU  Anesthesia Type: General  Level of Consciousness: awake, oriented, sedated and patient cooperative  Airway & Oxygen Therapy: Patient Spontanous Breathing and Patient connected to face mask oxygen  Post-op Assessment: Report given to PACU RN and Post -op Vital signs reviewed and stable  Post vital signs: Reviewed and stable  Complications: No apparent anesthesia complications Last Vitals:  Vitals Value Taken Time  BP    Temp    Pulse    Resp    SpO2      Last Pain:  Vitals:   11/25/17 1003  TempSrc: Oral

## 2017-11-25 NOTE — Progress Notes (Signed)
  Radiation Oncology         [phone]) [phone] ________________________________  Name: TALIN FEISTER MRN: [id number]  Date: 11/25/2017  DOB: 12/18/1945       Prostate Seed Implant  VB:TYOM, Weldon Picking, MD  No ref. provider found  DIAGNOSIS: 72 y.o. Stage T2a adenocarcinoma of the prostate with Gleason Score of 5+3, and PSA of 6.5  PROCEDURE: Insertion of radioactive I-125 seeds into the prostate gland.  RADIATION DOSE: 110 Gy, boost therapy.  TECHNIQUE: EDRIAN MELUCCI was brought to the operating room with the urologist. He was placed in the dorsolithotomy position. He was catheterized and a rectal tube was inserted. The perineum was shaved, prepped and draped. The ultrasound probe was then introduced into the rectum to see the prostate gland.  TREATMENT DEVICE: A needle grid was attached to the ultrasound probe stand and anchor needles were placed.  3D PLANNING: The prostate was imaged in 3D using a sagittal sweep of the prostate probe. These images were transferred to the planning computer. There, the prostate, urethra and rectum were defined on each axial reconstructed image. Then, the software created an optimized 3D plan and a few seed positions were adjusted. The quality of the plan was reviewed using [facility] information for the target and the following two organs at risk:  Urethra and Rectum.  Then the accepted plan was printed and handed off to the radiation therapist.  Under my supervision, the custom loading of the seeds and spacers was carried out and loaded into sealed vicryl sleeves.  These pre-loaded needles were then placed into the needle holder.Marland Kitchen  PROSTATE VOLUME STUDY:  Using transrectal ultrasound the volume of the prostate was verified to be 26.6 cc.  SPECIAL TREATMENT PROCEDURE/SUPERVISION AND HANDLING: The pre-loaded needles were then delivered under sagittal guidance. A total of 22 needles were used to deposit 75 seeds in the prostate gland. The individual seed activity was 0.261  mCi.  SpaceOAR:  Yes - Previous to IMRT  COMPLEX SIMULATION: At the end of the procedure, an anterior radiograph of the pelvis was obtained to document seed positioning and count. Cystoscopy was performed to check the urethra and bladder.  MICRODOSIMETRY: At the end of the procedure, the patient was emitting 0.035 mR/hr at 1 meter. Accordingly, he was considered safe for hospital discharge.  PLAN: The patient will return to the radiation oncology clinic for post implant CT dosimetry in three weeks.   ________________________________  Sheral Apley Tammi Klippel, M.D.

## 2017-11-25 NOTE — Anesthesia Procedure Notes (Signed)
Procedure Name: LMA Insertion Date/Time: 11/25/2017 11:24 AM Performed by: Suan Halter, CRNA Pre-anesthesia Checklist: Patient identified, Emergency Drugs available, Suction available and Patient being monitored Patient Re-evaluated:Patient Re-evaluated prior to induction Oxygen Delivery Method: Circle system utilized Preoxygenation: Pre-oxygenation with 100% oxygen Induction Type: IV induction Ventilation: Mask ventilation without difficulty LMA: LMA inserted LMA Size: 4.0 Number of attempts: 1 Airway Equipment and Method: Bite block Placement Confirmation: positive ETCO2 Tube secured with: Tape Dental Injury: Teeth and Oropharynx as per pre-operative assessment

## 2017-11-25 NOTE — Op Note (Signed)
PATIENT:  [name]  PRE-OPERATIVE DIAGNOSIS:  Adenocarcinoma of the prostate  POST-OPERATIVE DIAGNOSIS:  Same  PROCEDURE:  Procedure(s): 1. I-125 radioactive seed implantation 2. Cystoscopy  SURGEON:  Surgeon(s): Irine Seal MD  Radiation oncologist: Dr. Tyler Pita  ANESTHESIA:  General  EBL:  Minimal  DRAINS: 50 French Foley catheter  INDICATION: George [name] is a 72 y.o. with Stage T2a N0 M0 , Gleason 8(5+3)  prostate cancer who has elected ADT with EXRT and boost brachytherapy for treatment.  He has completed EXRT and had SpaceOAR prior to initiation of therapy.  Description of procedure: After informed consent the patient was brought to the major OR, placed on the table and administered general anesthesia. He was then moved to the modified lithotomy position with his perineum perpendicular to the floor. His perineum and genitalia were then sterilely prepped. An official timeout was then performed. A 16 French Foley catheter was then placed in the bladder and filled with dilute contrast, a rectal tube was placed in the rectum and the transrectal ultrasound probe was placed in the rectum and affixed to the stand. He was then sterilely draped.  The sterile grid was installed.   Anchor needles were then placed.   Real time ultrasonography was used along with the seed planning software spot-pro version 3.1-00. This was used to develop the seed plan including the number of needles as well as number of seeds required for complete and adequate coverage. Real-time ultrasonography was then used along with the previously developed plan  to implant a total of 75 seeds using 22 needles for a target dose of 145 Gy. This proceeded without difficulty or complication.  A Foley catheter was then removed as well as the transrectal ultrasound probe and rectal probe. Flexible cystoscopy was then performed using the 17 French flexible scope which revealed a normal urethra throughout its length down  to the sphincter which appeared intact. The prostatic urethra was short with bilobar hyperplasia with mild obstruction. The bladder was then entered and fully and systematically inspected.  The ureteral orifices were noted to be of normal configuration and position. The mucosa revealed no evidence of tumors. There were also no stones identified within the bladder.  No seeds or spacers were seen and/or removed from the bladder.  The cystoscope was then removed.  The drapes were removed.  The perineum was cleaned and dressed.  He was taken out of the lithotomy position and was awakened and taken to recovery room in stable and satisfactory condition. He tolerated procedure well and there were no intraoperative complications.

## 2017-11-26 ENCOUNTER — Encounter (HOSPITAL_BASED_OUTPATIENT_CLINIC_OR_DEPARTMENT_OTHER): Payer: Self-pay | Admitting: Urology

## 2017-11-26 NOTE — Anesthesia Postprocedure Evaluation (Signed)
Anesthesia Post Note  Patient: George [name]  Procedure(s) Performed: RADIOACTIVE SEED IMPLANT/BRACHYTHERAPY IMPLANT (N/A )     Patient location during evaluation: PACU Anesthesia Type: General Level of consciousness: awake and alert Pain management: pain level controlled Vital Signs Assessment: post-procedure vital signs reviewed and stable Respiratory status: spontaneous breathing, nonlabored ventilation and respiratory function stable Cardiovascular status: blood pressure returned to baseline and stable Postop Assessment: no apparent nausea or vomiting Anesthetic complications: no    Last Vitals:  Vitals:   11/25/17 1345 11/25/17 1500  BP: (!) 154/88 (!) 163/91  Pulse:  97  Resp:  17  Temp:  36.7 C  SpO2:  99%    Last Pain:  Vitals:   11/25/17 1445  TempSrc:   PainSc: 3                  Lynda Rainwater

## 2017-11-27 DIAGNOSIS — E119 Type 2 diabetes mellitus without complications: Secondary | ICD-10-CM | POA: Diagnosis not present

## 2017-11-27 DIAGNOSIS — I10 Essential (primary) hypertension: Secondary | ICD-10-CM | POA: Diagnosis not present

## 2017-11-28 ENCOUNTER — Ambulatory Visit (INDEPENDENT_AMBULATORY_CARE_PROVIDER_SITE_OTHER): Payer: Medicare HMO | Admitting: Urology

## 2017-11-28 DIAGNOSIS — C61 Malignant neoplasm of prostate: Secondary | ICD-10-CM | POA: Diagnosis not present

## 2017-12-09 ENCOUNTER — Telehealth: Payer: Self-pay | Admitting: *Deleted

## 2017-12-09 NOTE — Telephone Encounter (Signed)
CALLED PATIENT TO REMIND OF POST SEED APPTS AND MRI FOR 12-10-17, SPOKE WITH PATIENT AND HE IS AWARE OF THESE APPTS.

## 2017-12-10 ENCOUNTER — Other Ambulatory Visit: Payer: Self-pay

## 2017-12-10 ENCOUNTER — Ambulatory Visit
Admission: RE | Admit: 2017-12-10 | Discharge: 2017-12-10 | Disposition: A | Discharge status: Home / Self Care | Payer: Medicare HMO | Source: Ambulatory Visit | Attending: Radiation Oncology | Admitting: Radiation Oncology

## 2017-12-10 ENCOUNTER — Encounter: Payer: Self-pay | Admitting: Radiation Oncology

## 2017-12-10 ENCOUNTER — Ambulatory Visit (HOSPITAL_COMMUNITY)
Admission: RE | Admit: 2017-12-10 | Discharge: 2017-12-10 | Disposition: A | Discharge status: Home / Self Care | Payer: Medicare HMO | Source: Ambulatory Visit | Attending: Urology | Admitting: Urology

## 2017-12-10 ENCOUNTER — Other Ambulatory Visit: Payer: Self-pay | Admitting: Urology

## 2017-12-10 VITALS — BP 149/80 | HR 78 | Temp 97.7°F | Resp 20 | Ht 75.0 in | Wt 225.0 lb

## 2017-12-10 DIAGNOSIS — Z794 Long term (current) use of insulin: Secondary | ICD-10-CM | POA: Diagnosis not present

## 2017-12-10 DIAGNOSIS — Z79899 Other long term (current) drug therapy: Secondary | ICD-10-CM | POA: Diagnosis not present

## 2017-12-10 DIAGNOSIS — Z7982 Long term (current) use of aspirin: Secondary | ICD-10-CM | POA: Diagnosis not present

## 2017-12-10 DIAGNOSIS — Z923 Personal history of irradiation: Secondary | ICD-10-CM | POA: Insufficient documentation

## 2017-12-10 DIAGNOSIS — C61 Malignant neoplasm of prostate: Secondary | ICD-10-CM | POA: Diagnosis not present

## 2017-12-10 MED ORDER — TAMSULOSIN HCL 0.4 MG PO CAPS: 0.4000 mg | ORAL_CAPSULE | Freq: Every day | ORAL | 5 refills | Status: DC

## 2017-12-10 NOTE — Progress Notes (Signed)
Patient was a no call no show for MRI [facility] today Spoke with Enid Derry 1:28pm

## 2017-12-10 NOTE — Progress Notes (Signed)
Mr. Kemler presents today POST seed. Pt reports occasional burning with urination, and pt reports that burning is lessening. Pt denies hematuria. Pt denies bladder leakage. Pt's IPSS score is 28. Pt reports that diarrhea has resolved and sometimes struggles with constipation. Pt's next urology appt is Friday, 12/12/17.   Loma Sousa, RN BSN

## 2017-12-10 NOTE — Progress Notes (Signed)
  Radiation Oncology         [phone] ________________________________  Name: George [name] MRN: [id number]  Date: 12/10/2017  DOB: 10-16-1945  COMPLEX SIMULATION NOTE  NARRATIVE:  The patient was brought to the [facility] today following prostate seed implantation approximately one month ago.  Identity was confirmed.  All relevant records and images related to the planned course of therapy were reviewed.  Then, the patient was set-up supine.  CT images were obtained.  The CT images were loaded into the planning software.  Then the prostate and rectum were contoured.  Treatment planning then occurred.  The implanted iodine 125 seeds were identified by the physics staff for projection of radiation distribution  I have requested : 3D Simulation  I have requested a DVH of the following structures: Prostate and rectum.    ________________________________  Sheral Apley Tammi Klippel, M.D.  This document serves as a record of services personally performed by George Pita, MD. It was created on his behalf by Wilburn Mylar, a trained medical scribe. The creation of this record is based on the scribe's personal observations and the provider's statements to them. This document has been checked and approved by the attending provider.

## 2017-12-10 NOTE — Progress Notes (Signed)
Radiation Oncology         [phone] ________________________________  Name: George [name] MRN: [id number]  Date: 12/10/2017  DOB: [birth year]  Post-Seed Follow-Up Visit Note  CC: Monico Blitz, MD  Raynelle Bring, MD  Diagnosis:   72 y.o. 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  No diagnosis found.  Interval Since Last Radiation:  2 weeks s/p brachytherapy boost to supplement a 5 week course of prostate EBRT for a nominal total dose of 155 Gy. 11/25/17: Insertion of radioactive I-125 seeds into the prostate gland; 110 Gy, boost therapy 09/01/17 - 10/06/17: Prostate / 45 Gy in 25 fractions  Narrative:  The patient returns today for routine follow-up.  He is complaining of increased urinary frequency and urinary hesitation symptoms. He filled out a questionnaire regarding urinary function today providing an overall IPSS score of 28 characterizing his symptoms as severe.  He reports occasional burning with urination, which is gradually improving but his most bothersome symptom by far is nocturia 4-5x/night.  He continues with increased daytime frequency and urgency but reports that this is manageable as compared to the nocturia.  He also continues with a weak flow of stream, intermittency and difficulty emptying his bladder but denies incontinence or gross hematuria. He reports that the diarrhea has resolved but now he occasionally struggles with constipation. His pre-implant IPSS was 14.  He has continued to tolerate ADT well- receiving monthly Firmagon injections which were started 05/23/17.  ALLERGIES:  has No Known Allergies.  Meds: Current Outpatient Medications  Medication Sig Dispense Refill  . amLODipine (NORVASC) 5 MG tablet Take 1 tablet by mouth daily.    Marland Kitchen aspirin-sod bicarb-citric acid (ALKA-SELTZER) 325 MG TBEF tablet Take 325 mg by mouth every 6 (six) hours as needed.    Marland Kitchen atorvastatin (LIPITOR) 20 MG tablet Take 20 mg by mouth  daily.    . Blood Glucose Monitoring Suppl (TRUE METRIX AIR GLUCOSE METER) w/Device KIT See admin instructions. Blood Glucose Monitoring Supplies (True Metrix Air Glucose Meter)  0  . cloNIDine (CATAPRES) 0.1 MG tablet Take 1 tablet by mouth daily.    Marland Kitchen GLOBAL EASE INJECT PEN NEEDLES 31G X 5 MM MISC 3 (three) times daily. Between 15 units and 31 units based on blood sugar  5  . HYDROcodone-acetaminophen (NORCO) 5-325 MG tablet Take 1 tablet by mouth every 6 (six) hours as needed for moderate pain. 8 tablet 0  . Insulin Glargine (LANTUS SOLOSTAR) 100 UNIT/ML Solostar Pen Inject 80 Units into the skin at bedtime.     Marland Kitchen LORazepam (ATIVAN) 1 MG tablet Take 1 tablet (1 mg total) by mouth as needed for anxiety (30 minutes prior to MRI scan and may repeat 30 minutes later if needed). 2 tablet 0  . losartan (COZAAR) 100 MG tablet Take 1 tablet by mouth daily.    . Metoprolol Succinate 100 MG CS24 Take by mouth.    . NON FORMULARY True Metrix Strips (DiabeticClub50)  12  . NOVOLOG FLEXPEN 100 UNIT/ML FlexPen INJECT 25-31 UNITS INTO THE SKIN THREE TIMES DAILY BEFORE MEALS 30 mL 0   Current Facility-Administered Medications  Medication Dose Route Frequency Provider Last Rate Last Dose  . Bevacizumab (AVASTIN) SOLN 1.25 mg  1.25 mg Intravitreal  Bernarda Caffey, MD   1.25 mg at 05/28/17 1541  . Bevacizumab (AVASTIN) SOLN 1.25 mg  1.25 mg Intravitreal  Bernarda Caffey, MD   1.25 mg at 07/14/17 2241  . Bevacizumab (  AVASTIN) SOLN 1.25 mg  1.25 mg Intravitreal  Bernarda Caffey, MD   1.25 mg at 08/26/17 1530  . Bevacizumab (AVASTIN) SOLN 1.25 mg  1.25 mg Intravitreal  Bernarda Caffey, MD   1.25 mg at 10/13/17 1530    Physical Findings: In general this is a well appearing Caucasian gentleman in no acute distress. He's alert and oriented x4 and appropriate throughout the examination. Cardiopulmonary assessment is negative for acute distress and he exhibits normal effort.   Lab Findings: Lab Results  Component Value  Date   WBC 3.2 (L) 11/18/2017   HGB 11.8 (L) 11/18/2017   HCT 35.7 (L) 11/18/2017   MCV 87.7 11/18/2017   PLT 178 11/18/2017    Radiographic Findings:  Patient underwent CT imaging in our clinic for post implant dosimetry. The CT was reviewed by Dr. Tammi Klippel and appears to demonstrate an adequate distribution of radioactive seeds throughout the prostate gland. There are no seeds in or near the rectum. We suspect the final radiation plan and dosimetry will show appropriate coverage of the prostate gland.  He will not have a prostate MRI today due to the inability to tolerate MRI scan despite pre-medicating with Ativan.  Impression/Plan: The patient is recovering from the effects of radiation. His urinary symptoms should gradually improve over the next 4-6 months. We talked about this today. He is encouraged by his improvement already and is otherwise pleased with his outcome.  He is interested in a trial of Flomax 0.4 mg p.o. nightly to see if this will help improve his lower urinary tract symptoms, particularly the nocturia which is most bothersome.  I have sent a prescription to his pharmacy and he will report on his progress when he sees Dr. Jeffie Pollock next week.  We also talked about long-term follow-up for prostate cancer following seed implant.  He anticipates completing a full 2-year course of ADT under the care and direction of Dr. Jeffie Pollock.  He understands that ongoing PSA determinations and digital rectal exams will help perform surveillance to rule out disease recurrence. He has a follow up appointment scheduled with Dr. Jeffie Pollock on 12/19/17. He understands what to expect with his PSA measures. Patient was also educated today about some of the long-term effects from radiation including a small risk for rectal bleeding and possibly erectile dysfunction. We talked about some of the general management approaches to these potential complications. However, I did encourage the patient to contact our office or  return at any point if he has questions or concerns related to his previous radiation and prostate cancer.    Nicholos Johns, PA-C  This document serves as a record of services personally performed by [employer], PA-C. It was created on her behalf by Wilburn Mylar, a trained medical scribe. The creation of this record is based on the scribe's personal observations and the provider's statements to them. This document has been checked and approved by the attending provider.

## 2017-12-19 ENCOUNTER — Ambulatory Visit (INDEPENDENT_AMBULATORY_CARE_PROVIDER_SITE_OTHER): Payer: Medicare HMO | Admitting: Urology

## 2017-12-19 DIAGNOSIS — C61 Malignant neoplasm of prostate: Secondary | ICD-10-CM

## 2017-12-19 DIAGNOSIS — R351 Nocturia: Secondary | ICD-10-CM

## 2017-12-19 DIAGNOSIS — N403 Nodular prostate with lower urinary tract symptoms: Secondary | ICD-10-CM

## 2017-12-25 DIAGNOSIS — I10 Essential (primary) hypertension: Secondary | ICD-10-CM | POA: Diagnosis not present

## 2017-12-25 DIAGNOSIS — E119 Type 2 diabetes mellitus without complications: Secondary | ICD-10-CM | POA: Diagnosis not present

## 2017-12-29 ENCOUNTER — Encounter: Payer: Self-pay | Admitting: Radiation Oncology

## 2017-12-29 DIAGNOSIS — C61 Malignant neoplasm of prostate: Secondary | ICD-10-CM | POA: Diagnosis not present

## 2017-12-31 ENCOUNTER — Ambulatory Visit (INDEPENDENT_AMBULATORY_CARE_PROVIDER_SITE_OTHER): Payer: Medicare HMO | Admitting: Urology

## 2017-12-31 DIAGNOSIS — C61 Malignant neoplasm of prostate: Secondary | ICD-10-CM | POA: Diagnosis not present

## 2018-01-05 ENCOUNTER — Encounter (INDEPENDENT_AMBULATORY_CARE_PROVIDER_SITE_OTHER): Payer: Medicare HMO | Admitting: Ophthalmology

## 2018-01-11 NOTE — Progress Notes (Signed)
  Radiation Oncology         [phone] ________________________________  Name: George [name] MRN: [id number]  Date: 12/29/2017  DOB: September 07, 1945  3D Planning Note   Prostate Brachytherapy Post-Implant Dosimetry  Diagnosis: 72 y.o. Stage T2a adenocarcinoma of the prostate with Gleason Score of 5+3, and PSA of 6.5  Narrative: On a previous date, George [name] returned following prostate seed implantation for post implant planning. He underwent CT scan complex simulation to delineate the three-dimensional structures of the pelvis and demonstrate the radiation distribution.  Since that time, the seed localization, and complex isodose planning with dose volume histograms have now been completed.  Results:   Prostate Coverage - The dose of radiation delivered to the 90% or more of the prostate gland (D90) was 116.69% of the prescription dose. This exceeds our goal of greater than 90%. Rectal Sparing - The volume of rectal tissue receiving the prescription dose or higher was 0.0 cc. This falls under our thresholds tolerance of 1.0 cc.  Impression: The prostate seed implant appears to show adequate target coverage and appropriate rectal sparing.  Plan:  The patient will continue to follow with urology for ongoing PSA determinations. I would anticipate a high likelihood for local tumor control with minimal risk for rectal morbidity.  ________________________________  Sheral Apley Tammi Klippel, M.D.

## 2018-01-27 NOTE — Progress Notes (Signed)
[facility] Note  01/28/2018     CHIEF COMPLAINT Patient presents for Retina Follow Up   HISTORY OF PRESENT ILLNESS: George [name] is a 73 y.o. male who presents to the clinic today for:   HPI    Retina Follow Up    Patient presents with  Wet AMD.  In both eyes.  Severity is mild.  Since onset it is stable.  I, the attending physician,  performed the HPI with the patient and updated documentation appropriately.          Comments    F/U EXU AMD OD. Patient states vision has been " doing good", denies new visual onsets/issues. BS 158 this am BS have been unstable due to hormone injections. Pt reports he had prostate seed implant 11/19. Pt is ready for tx today if indicted       Last edited by Bernarda Caffey, MD on 01/28/2018  3:33 PM. (History)    pt states he is doing well, he states he's getting hormone injections to keep his cancer "asleep"  Referring physician: Monico Blitz, MD [place],  [id number]  HISTORICAL INFORMATION:   Selected notes from the MEDICAL RECORD NUMBER Referred by Dr. Rocky Link for retinal hemorrhage LEE: 05.10.19 (R. Turner) [BCVA: OD: 20/20 OS: 20/20] Ocular Hx-Pseudophakia PMH-DM(on Lantus and Metformin), HTN, Arthritis    CURRENT MEDICATIONS: No current outpatient medications on file. (Ophthalmic Drugs)   No current facility-administered medications for this visit.  (Ophthalmic Drugs)   Current Outpatient Medications (Other)  Medication Sig  . amLODipine (NORVASC) 5 MG tablet Take 1 tablet by mouth daily.  Marland Kitchen aspirin-sod bicarb-citric acid (ALKA-SELTZER) 325 MG TBEF tablet Take 325 mg by mouth every 6 (six) hours as needed.  Marland Kitchen atorvastatin (LIPITOR) 20 MG tablet Take 20 mg by mouth daily.  . Blood Glucose Monitoring Suppl (TRUE METRIX AIR GLUCOSE METER) w/Device KIT See admin instructions. Blood Glucose Monitoring Supplies (True Metrix Air Glucose Meter)  . cloNIDine (CATAPRES) 0.1 MG tablet Take 1 tablet  by mouth daily.  Marland Kitchen GLOBAL EASE INJECT PEN NEEDLES 31G X 5 MM MISC 3 (three) times daily. Between 15 units and 31 units based on blood sugar  . HYDROcodone-acetaminophen (NORCO) 5-325 MG tablet Take 1 tablet by mouth every 6 (six) hours as needed for moderate pain.  . Insulin Glargine (LANTUS SOLOSTAR) 100 UNIT/ML Solostar Pen Inject 80 Units into the skin at bedtime.   Marland Kitchen LORazepam (ATIVAN) 1 MG tablet Take 1 tablet (1 mg total) by mouth as needed for anxiety (30 minutes prior to MRI scan and may repeat 30 minutes later if needed).  . losartan (COZAAR) 100 MG tablet Take 1 tablet by mouth daily.  . Metoprolol Succinate 100 MG CS24 Take by mouth.  . NON FORMULARY True Metrix Strips (DiabeticClub50)  . NOVOLOG FLEXPEN 100 UNIT/ML FlexPen INJECT 25-31 UNITS INTO THE SKIN THREE TIMES DAILY BEFORE MEALS  . tamsulosin (FLOMAX) 0.4 MG CAPS capsule Take 1 capsule (0.4 mg total) by mouth daily after supper.   Current Facility-Administered Medications (Other)  Medication Route  . Bevacizumab (AVASTIN) SOLN 1.25 mg Intravitreal  . Bevacizumab (AVASTIN) SOLN 1.25 mg Intravitreal  . Bevacizumab (AVASTIN) SOLN 1.25 mg Intravitreal  . Bevacizumab (AVASTIN) SOLN 1.25 mg Intravitreal  . Bevacizumab (AVASTIN) SOLN 1.25 mg Intravitreal      REVIEW OF SYSTEMS: ROS    Positive for: Eyes   Negative for: Constitutional, Gastrointestinal, Neurological, Skin, Genitourinary, Musculoskeletal, HENT, Endocrine, Cardiovascular,  Respiratory, Psychiatric, Allergic/Imm, Heme/Lymph   Last edited by Zenovia Jordan, LPN on 2/58/5277  8:24 PM. (History)       ALLERGIES No Known Allergies  PAST MEDICAL HISTORY Past Medical History:  Diagnosis Date  . Acute upper respiratory infections of unspecified site 2 weeks ago as of 11-18-17, resolved now  . Cancer [facility])    Prostate  . Chronic back pain    buldging disc and inflammatory arthritis  . Colitis, enteritis, and gastroenteritis of presumed infectious origin   .  Confusion    occasionally short term  . Coronary artery disease   . Dermatophytosis of the body   . Diabetes mellitus without complication ([place])    takes Lantus   . Diabetes type 2, controlled ([place])    type 2  . Dizziness    occ with fast head movement  . Eczema    right lower leg  . Generalized osteoarthrosis, unspecified site   . GERD (gastroesophageal reflux disease)   . Herpes zoster without mention of complication   . History of bronchitis many yrs ago  . History of claustrophobia   . HOH (hard of hearing)    both ears  . Joint pain   . Lumbago   . Other dyspnea and respiratory abnormality    with exertion  . Pleurisy without mention of effusion or current tuberculosis   . Prostate cancer ([place])   . Psoriasis and similar disorder   . Pure hypercholesterolemia    takes Atorvastatin daily  . Sinusitis    takes Allegra nightly and FLonase prn  . Unspecified essential hypertension    takes Amlodipine,Losartan,CLonidine,Lisinopril,and Metoprolol daily   Past Surgical History:  Procedure Laterality Date  . CATARACT EXTRACTION W/PHACO Left 04/04/2014   Procedure: CATARACT EXTRACTION PHACO AND INTRAOCULAR LENS PLACEMENT (IOC);  Surgeon: Williams Che, MD;  Location: [facility];  Service: Ophthalmology;  Laterality: Left;  CDE:4.49  . CATARACT EXTRACTION W/PHACO Right 01/02/2015   Procedure: CATARACT EXTRACTION PHACO AND INTRAOCULAR LENS PLACEMENT; CDE:  6.36;  Surgeon: Williams Che, MD;  Location: [facility];  Service: Ophthalmology;  Laterality: Right;  . CIRCUMCISION  2010  . CORONARY ARTERY BYPASS GRAFT N/A 08/13/2012   Procedure: CORONARY ARTERY BYPASS GRAFTING (CABG);  Surgeon: Ivin Poot, MD;  Location: [place];  Service: Open Heart Surgery;  Laterality: N/A;  . ENDOVEIN HARVEST OF GREATER SAPHENOUS VEIN Right 08/13/2012   Procedure: ENDOVEIN HARVEST OF GREATER SAPHENOUS VEIN;  Surgeon: Ivin Poot, MD;  Location: [place];  Service: Open Heart Surgery;  Laterality:  Right;  . INTRAOPERATIVE TRANSESOPHAGEAL ECHOCARDIOGRAM N/A 08/13/2012   Procedure: INTRAOPERATIVE TRANSESOPHAGEAL ECHOCARDIOGRAM;  Surgeon: Ivin Poot, MD;  Location: [place];  Service: Open Heart Surgery;  Laterality: N/A;  . LEFT HEART CATHETERIZATION WITH CORONARY ANGIOGRAM N/A 08/07/2012   Procedure: LEFT HEART CATHETERIZATION WITH CORONARY ANGIOGRAM;  Surgeon: Peter M Martinique, MD;  Location[facility] CATH LAB;  Service: Cardiovascular;  Laterality: N/A;  . RADIOACTIVE SEED IMPLANT N/A 11/25/2017   Procedure: RADIOACTIVE SEED IMPLANT/BRACHYTHERAPY IMPLANT;  Surgeon: Irine Seal, MD;  Location: [facility];  Service: Urology;  Laterality: N/A;  . SEED IMPLANT DONE AT [place]  01/2017    FAMILY HISTORY Family History  Problem Relation Age of Onset  . CAD Other   . Heart attack Other   . Diabetes Other   . Cataracts Mother   . Amblyopia Neg Hx   . Blindness Neg Hx   . Glaucoma Neg Hx   .  Macular degeneration Neg Hx   . Retinal detachment Neg Hx   . Strabismus Neg Hx   . Retinitis pigmentosa Neg Hx     SOCIAL HISTORY Social History   Tobacco Use  . Smoking status: Former Smoker    Packs/day: 1.00    Years: 4.00    Pack years: 4.00    Types: Cigarettes    Start date: 03/04/1965    Last attempt to quit: 03/05/1967    Years since quitting: 50.9  . Smokeless tobacco: Never Used  Substance Use Topics  . Alcohol use: No    Alcohol/week: 0.0 standard drinks  . Drug use: No         OPHTHALMIC EXAM:  Base Eye Exam    Visual Acuity (Snellen - Linear)      Right Left   Dist [place] 20/30 +1 20/60 -2   Dist ph [place] 20/30 +2 20/40 -1       Tonometry (Tonopen, 2:54 PM)      Right Left   Pressure 12 14       Pupils      Dark Light Shape React APD   Right 3 2 Round Brisk None   Left 3 2 Round Brisk None       Visual Fields (Counting fingers)      Left Right    Full Full       Extraocular Movement      Right Left    Full, Ortho Full, Ortho        Neuro/Psych    Oriented x3:  Yes   Mood/Affect:  Normal       Dilation    Both eyes:  1.0% Mydriacyl, 2.5% Phenylephrine @ 2:54 PM        Slit Lamp and Fundus Exam    Slit Lamp Exam      Right Left   Lids/Lashes Dermatochalasis - upper lid Dermatochalasis - upper lid   Conjunctiva/Sclera Temporal and Nasal Pinguecula Temporal and Nasal Pinguecula   Cornea 1+ Inferior Punctate epithelial erosions, Arcus, superior cataract wounds with refractile deposites Arcus, superior cataract wounds well healed   Anterior Chamber Deep and quiet, no cell or flare Deep and quiet, no cell or flare   Iris Round and dilated, No NVI Round and dilated, No NVI   Lens PC IOL in good position, open PC PC IOL in good position, 1+ non-central PCO   Vitreous Vitreous syneresis, Posterior vitreous detachment Vitreous syneresis, Posterior vitreous detachment       Fundus Exam      Right Left   Disc 360 Peripapillary atrophy, CNV surrounding pigment clumping, no heme Sharp, 360 Peripapillary atrophy   C/D Ratio 0.4 0.3   Macula Flat, Blunted foveal reflex, Drusen, RPE mottling and clumping, early Atrophy, no heme Flat, Blunted foveal reflex, RPE mottling and clumping, Drusen, No heme or edema   Vessels Vascular attenuation, AV crossing changes Mild Vascular attenuation, mild AV crossing changes   Periphery Attached Attached, irregular pigmented lesion along ST arcade about 1.5DD area - flat          IMAGING AND PROCEDURES  Imaging and Procedures for @TODAY @  OCT, Retina - OU - Both Eyes       Right Eye Quality was good. Central Foveal Thickness: 303. Progression has improved. Findings include normal foveal contour, retinal drusen , outer retinal atrophy, pigment epithelial detachment, no IRF, no SRF (Persistent SRHM / PED).   Left Eye Quality was good. Central Foveal Thickness: 311.  Progression has been stable. Findings include normal foveal contour, no IRF, no SRF, retinal drusen , pigment epithelial  detachment, outer retinal atrophy.   Notes *Images captured and stored on drive  Diagnosis / Impression:  Exudative ARMD OD -- persistent peripapillary SRHM/PED with overlying cystic change -- improved Nonexudative ARMD OS   Clinical management:  See below  Abbreviations: NFP - Normal foveal profile. CME - cystoid macular edema. PED - pigment epithelial detachment. IRF - intraretinal fluid. SRF - subretinal fluid. EZ - ellipsoid zone. ERM - epiretinal membrane. ORA - outer retinal atrophy. ORT - outer retinal tubulation. SRHM - subretinal hyper-reflective material         Intravitreal Injection, Pharmacologic Agent - OD - Right Eye       Time Out 01/28/2018. 4:11 PM. Confirmed correct patient, procedure, site, and patient consented.   Anesthesia Topical anesthesia was used. Anesthetic medications included Lidocaine 2%, Proparacaine 0.5%.   Procedure Preparation included 5% betadine to ocular surface, eyelid speculum. A supplied needle was used.   Injection:  1.25 mg Bevacizumab (AVASTIN) SOLN   NDC: 33825-053-97, Lot: [id number] , Expiration date: 02/24/2018   Route: Intravitreal, Site: Right Eye, Waste: 0 mL  Post-op Post injection exam found visual acuity of at least counting fingers. The patient tolerated the procedure well. There were no complications. The patient received written and verbal post procedure care education.                 ASSESSMENT/PLAN:    ICD-10-CM   1. Exudative age-related macular degeneration of right eye with active choroidal neovascularization (HCC) H35.3211 Intravitreal Injection, Pharmacologic Agent - OD - Right Eye    Bevacizumab (AVASTIN) SOLN 1.25 mg  2. Retinal edema H35.81 OCT, Retina - OU - Both Eyes  3. Intermediate stage nonexudative age-related macular degeneration of left eye H35.3122   4. 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 Posterior vitreous detachment of both eyes H43.813   6. Pseudophakia of both eyes Z96.1    7. PCO (posterior capsular opacification), bilateral H26.493     1,2. Exudative age related macular degeneration, OD  - peripapillary CNVM with +subretinal heme and edema, nasal disc -- improved today with no active SRF or heme  - S/P IVA OD #1 (05.15.19), #2 (07.01.19), #3 (08.13.19), #4 (09.30.19)  - OCT shows drusen and low-lying PED without IRF/SRF in macula; but peripapillary IRF/SRF overlying PED/SRHM stably improved today with resolved subretinal heme (today is 15 wks)  - delayed follow up (from 08.13.19 to 09.30.19) due to radiation treatments for prostate cancer  - BCVA today 20/30 OD, stable  - discussed prn vs treat and extend strategies for anti-VEGF therapy  - recommend continued maintenance treatment ~q3-4 mos  - pt wishes to proceed with IVA #5 OD today (01.15.20)   - RBA of procedure discussed, questions answered  - informed consent obtained and signed  - see procedure note  - F/U 4 months  3. Age related macular degeneration, non-exudative, OS  - The incidence, anatomy, and pathology of dry AMD, risk of progression, and the AREDS and AREDS 2 study including smoking risks discussed with patient.  - Will continue to monitor  4. Choroidal nevus OS-  - flat pigmented lesion along sup temp arcade  - no suspcious features -- low concern for choroidal melanoma  - monitor  5. PVD / vitreous syneresis OU  - Discussed findings and prognosis  - No RT or RD on 360 exam  - Reviewed s/s of  RT/RD  - Strict return precautions for any such RT/RD signs/symptoms  6. Pseudophakia OU  - s/p CE/IOL OU by Dr. Iona Hansen in 2016  - doing well  - monitor  7. PCO OU -- OD>OS  - S/P YAG cap OD (05.29.19)  - looks great with PC nicely open OD  - OS not yet visually significant  - monitor    Ophthalmic Meds Ordered this visit:  Meds ordered this encounter  Medications  . Bevacizumab (AVASTIN) SOLN 1.25 mg       Return for 3-4 mos;, Dilated Exam, OCT, Possible Injxn.  There  are no Patient Instructions on file for this visit.   Explained the diagnoses, plan, and follow up with the patient and they expressed understanding.  Patient expressed understanding of the importance of proper follow up care.   This document serves as a record of services personally performed by Gardiner Sleeper, MD, PhD. It was created on their behalf by Ernest Mallick, OA, an ophthalmic assistant. The creation of this record is the provider's dictation and/or activities during the visit.    Electronically signed by: Ernest Mallick, OA  01.14.2020 8:49 AM    Gardiner Sleeper, M.D., Ph.D. Diseases & Surgery of the Retina and Vitreous [facility]  I have reviewed the above documentation for accuracy and completeness, and I agree with the above. Gardiner Sleeper, M.D., Ph.D. 01/29/18 8:50 AM    Abbreviations: M myopia (nearsighted); A astigmatism; H hyperopia (farsighted); P presbyopia; Mrx spectacle prescription;  CTL contact lenses; OD right eye; OS left eye; OU both eyes  XT exotropia; ET esotropia; PEK punctate epithelial keratitis; PEE punctate epithelial erosions; DES dry eye syndrome; MGD meibomian gland dysfunction; ATs artificial tears; PFAT's preservative free artificial tears; [place] nuclear sclerotic cataract; PSC posterior subcapsular cataract; ERM epi-retinal membrane; PVD posterior vitreous detachment; RD retinal detachment; DM diabetes mellitus; DR diabetic retinopathy; NPDR non-proliferative diabetic retinopathy; PDR proliferative diabetic retinopathy; CSME clinically significant macular edema; DME diabetic macular edema; dbh dot blot hemorrhages; CWS cotton wool spot; POAG primary open angle glaucoma; C/D cup-to-disc ratio; HVF humphrey visual field; GVF goldmann visual field; OCT optical coherence tomography; IOP intraocular pressure; BRVO Branch retinal vein occlusion; CRVO central retinal vein occlusion; CRAO central retinal artery occlusion; BRAO branch retinal  artery occlusion; RT retinal tear; SB scleral buckle; PPV pars plana vitrectomy; VH Vitreous hemorrhage; PRP panretinal laser photocoagulation; IVK intravitreal kenalog; VMT vitreomacular traction; MH Macular hole;  NVD neovascularization of the disc; NVE neovascularization elsewhere; AREDS age related eye disease study; ARMD age related macular degeneration; POAG primary open angle glaucoma; EBMD epithelial/anterior basement membrane dystrophy; ACIOL anterior chamber intraocular lens; IOL intraocular lens; PCIOL posterior chamber intraocular lens; Phaco/IOL phacoemulsification with intraocular lens placement; [place] photorefractive keratectomy; LASIK laser assisted in situ keratomileusis; HTN hypertension; DM diabetes mellitus; COPD chronic obstructive pulmonary disease

## 2018-01-28 ENCOUNTER — Encounter (INDEPENDENT_AMBULATORY_CARE_PROVIDER_SITE_OTHER): Payer: Self-pay | Admitting: Ophthalmology

## 2018-01-28 ENCOUNTER — Ambulatory Visit (INDEPENDENT_AMBULATORY_CARE_PROVIDER_SITE_OTHER): Payer: Medicare Other | Admitting: Ophthalmology

## 2018-01-28 DIAGNOSIS — D3132 Benign neoplasm of left choroid: Secondary | ICD-10-CM | POA: Diagnosis not present

## 2018-01-28 DIAGNOSIS — H43813 Vitreous degeneration, bilateral: Secondary | ICD-10-CM

## 2018-01-28 DIAGNOSIS — H3581 Retinal edema: Secondary | ICD-10-CM | POA: Diagnosis not present

## 2018-01-28 DIAGNOSIS — H353122 Nonexudative age-related macular degeneration, left eye, intermediate dry stage: Secondary | ICD-10-CM | POA: Diagnosis not present

## 2018-01-28 DIAGNOSIS — H353211 Exudative age-related macular degeneration, right eye, with active choroidal neovascularization: Secondary | ICD-10-CM | POA: Diagnosis not present

## 2018-01-28 DIAGNOSIS — Z961 Presence of intraocular lens: Secondary | ICD-10-CM

## 2018-01-28 DIAGNOSIS — H26493 Other secondary cataract, bilateral: Secondary | ICD-10-CM

## 2018-01-29 ENCOUNTER — Encounter (INDEPENDENT_AMBULATORY_CARE_PROVIDER_SITE_OTHER): Payer: Self-pay | Admitting: Ophthalmology

## 2018-01-29 MED ORDER — BEVACIZUMAB CHEMO INJECTION 1.25MG/0.05ML SYRINGE FOR KALEIDOSCOPE
1.2500 mg | INTRAVITREAL | Status: DC
  Administered 2018-01-29: 1.25 mg via INTRAVITREAL

## 2018-01-30 ENCOUNTER — Ambulatory Visit: Payer: Medicare Other | Admitting: Urology

## 2018-01-30 DIAGNOSIS — N403 Nodular prostate with lower urinary tract symptoms: Secondary | ICD-10-CM | POA: Diagnosis not present

## 2018-01-30 DIAGNOSIS — R351 Nocturia: Secondary | ICD-10-CM

## 2018-01-30 DIAGNOSIS — C61 Malignant neoplasm of prostate: Secondary | ICD-10-CM

## 2018-03-04 ENCOUNTER — Ambulatory Visit: Payer: Medicare Other | Admitting: Urology

## 2018-03-10 ENCOUNTER — Ambulatory Visit (INDEPENDENT_AMBULATORY_CARE_PROVIDER_SITE_OTHER): Payer: Medicare Other | Admitting: Urology

## 2018-03-10 DIAGNOSIS — C61 Malignant neoplasm of prostate: Secondary | ICD-10-CM | POA: Diagnosis not present

## 2018-04-03 ENCOUNTER — Other Ambulatory Visit: Payer: Self-pay

## 2018-04-03 ENCOUNTER — Ambulatory Visit: Payer: Medicare Other | Admitting: Urology

## 2018-04-03 DIAGNOSIS — C61 Malignant neoplasm of prostate: Secondary | ICD-10-CM

## 2018-04-03 DIAGNOSIS — R351 Nocturia: Secondary | ICD-10-CM

## 2018-04-03 DIAGNOSIS — N403 Nodular prostate with lower urinary tract symptoms: Secondary | ICD-10-CM | POA: Diagnosis not present

## 2018-05-29 ENCOUNTER — Encounter (INDEPENDENT_AMBULATORY_CARE_PROVIDER_SITE_OTHER): Payer: Medicare Other | Admitting: Ophthalmology

## 2018-07-03 ENCOUNTER — Ambulatory Visit (INDEPENDENT_AMBULATORY_CARE_PROVIDER_SITE_OTHER): Payer: Medicare Other | Admitting: Urology

## 2018-07-03 DIAGNOSIS — N403 Nodular prostate with lower urinary tract symptoms: Secondary | ICD-10-CM

## 2018-07-03 DIAGNOSIS — R351 Nocturia: Secondary | ICD-10-CM

## 2018-07-03 DIAGNOSIS — C61 Malignant neoplasm of prostate: Secondary | ICD-10-CM | POA: Diagnosis not present

## 2018-08-06 IMAGING — NM NM BONE WHOLE BODY
4 series · 4 of 4 positions shown · non-contrast
Comparison: None

Correlation: CT chest 01/15/2017

CLINICAL DATA: Recent diagnosis of prostate cancer, some lower back
pain

EXAM:
NUCLEAR MEDICINE WHOLE BODY BONE SCAN
TECHNIQUE: Whole body anterior and posterior images were obtained approximately
3 hours after intravenous injection of radiopharmaceutical.
RADIOPHARMACEUTICALS:  20 mCi 2echnetium-LLm MDP IV

[Series 1: wbr_bone_60 whole body · 2.66mm/px · 1 of 1 slices shown (1 of 2)]
[im 1/1]
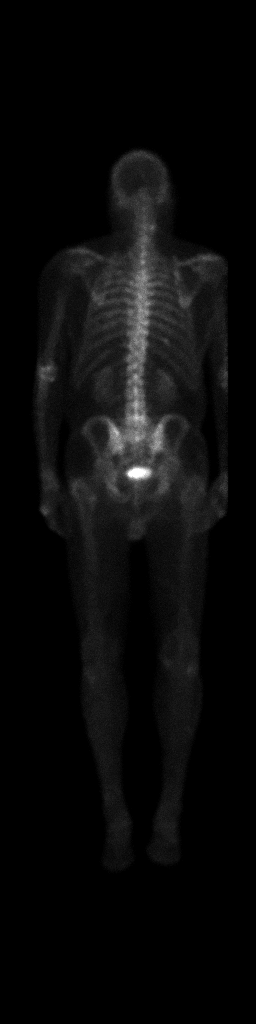

[Series 1: whole body · 2.66mm/px · 1 of 1 slices shown (1 of 2)]
[im 1/1]
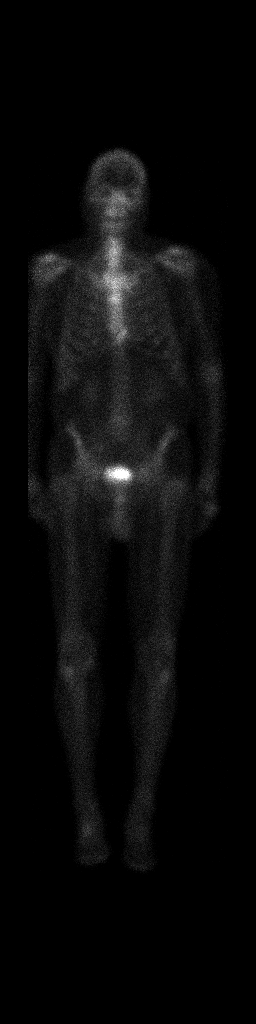

[Series 1: wbr_bone_60 whole body · 2.66mm/px · 1 of 1 slices shown (2 of 2)]
[im 1/1]
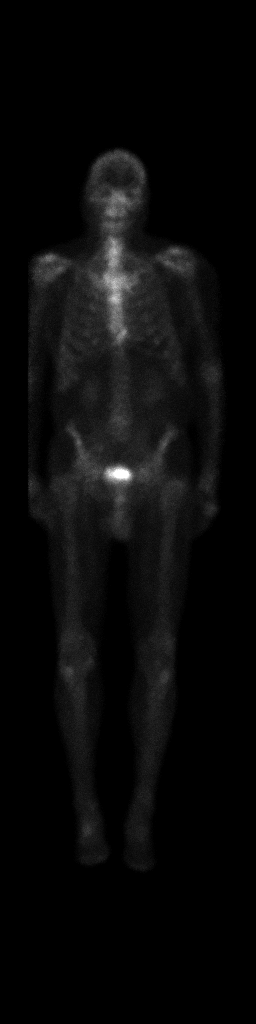

[Series 1: whole body · 2.66mm/px · 1 of 1 slices shown (2 of 2)]
[im 1/1]
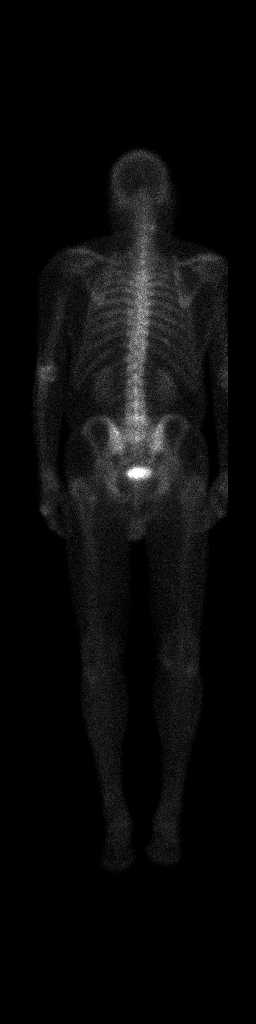

[4 of 4 positions shown; findings below may reference images not displayed]

FINDINGS: Minimal uptake in the shoulders, knees, RIGHT foot, wrists, elbows
and RIGHT lateral cervical spine, typically degenerative.

Single focus of abnormal increased tracer localization at a
posterior lower RIGHT rib approximately tenth, nonspecific.

No additional sites of worrisome osseous tracer accumulation are
identified.

Expected urinary tract and soft tissue distribution of tracer.
IMPRESSION: Scattered degenerative type uptake as above.

Single focus of nonspecific abnormal increased tracer localization
at the posterior RIGHT approximately tenth rib; no definite fracture
or bone destruction is seen at this site on recent CT.

Metastatic lesion is not excluded.

## 2018-08-07 ENCOUNTER — Ambulatory Visit: Payer: Medicare Other | Admitting: Urology

## 2018-08-11 ENCOUNTER — Ambulatory Visit (INDEPENDENT_AMBULATORY_CARE_PROVIDER_SITE_OTHER): Payer: Medicare Other | Admitting: Urology

## 2018-08-11 ENCOUNTER — Encounter (HOSPITAL_COMMUNITY): Payer: Self-pay | Admitting: Emergency Medicine

## 2018-08-11 ENCOUNTER — Other Ambulatory Visit: Payer: Self-pay

## 2018-08-11 ENCOUNTER — Emergency Department (HOSPITAL_COMMUNITY)
Admission: EM | Admit: 2018-08-11 | Discharge: 2018-08-12 | Disposition: A | Discharge status: Home / Self Care | Payer: Medicare Other | Attending: Emergency Medicine | Admitting: Emergency Medicine

## 2018-08-11 DIAGNOSIS — Z79899 Other long term (current) drug therapy: Secondary | ICD-10-CM | POA: Insufficient documentation

## 2018-08-11 DIAGNOSIS — E669 Obesity, unspecified: Secondary | ICD-10-CM | POA: Diagnosis not present

## 2018-08-11 DIAGNOSIS — Z87891 Personal history of nicotine dependence: Secondary | ICD-10-CM | POA: Insufficient documentation

## 2018-08-11 DIAGNOSIS — Z794 Long term (current) use of insulin: Secondary | ICD-10-CM | POA: Insufficient documentation

## 2018-08-11 DIAGNOSIS — R531 Weakness: Secondary | ICD-10-CM | POA: Diagnosis present

## 2018-08-11 DIAGNOSIS — C61 Malignant neoplasm of prostate: Secondary | ICD-10-CM | POA: Diagnosis not present

## 2018-08-11 DIAGNOSIS — Z8546 Personal history of malignant neoplasm of prostate: Secondary | ICD-10-CM | POA: Insufficient documentation

## 2018-08-11 DIAGNOSIS — I2510 Atherosclerotic heart disease of native coronary artery without angina pectoris: Secondary | ICD-10-CM | POA: Insufficient documentation

## 2018-08-11 DIAGNOSIS — I951 Orthostatic hypotension: Secondary | ICD-10-CM | POA: Insufficient documentation

## 2018-08-11 DIAGNOSIS — E119 Type 2 diabetes mellitus without complications: Secondary | ICD-10-CM | POA: Insufficient documentation

## 2018-08-11 DIAGNOSIS — Z20828 Contact with and (suspected) exposure to other viral communicable diseases: Secondary | ICD-10-CM | POA: Insufficient documentation

## 2018-08-11 DIAGNOSIS — Z951 Presence of aortocoronary bypass graft: Secondary | ICD-10-CM | POA: Insufficient documentation

## 2018-08-11 DIAGNOSIS — I10 Essential (primary) hypertension: Secondary | ICD-10-CM | POA: Diagnosis not present

## 2018-08-11 LAB — COMPREHENSIVE METABOLIC PANEL
ALT: 35 U/L (ref 0–44)
AST: 29 U/L (ref 15–41)
Albumin: 4.1 g/dL (ref 3.5–5.0)
Alkaline Phosphatase: 122 U/L (ref 38–126)
Anion gap: 10 (ref 5–15)
BUN: 21 mg/dL (ref 8–23)
CO2: 21 mmol/L — ABNORMAL LOW (ref 22–32)
Calcium: 8.8 mg/dL — ABNORMAL LOW (ref 8.9–10.3)
Chloride: 102 mmol/L (ref 98–111)
Creatinine, Ser: 1.06 mg/dL (ref 0.61–1.24)
GFR calc Af Amer: >60 mL/min (ref >60)
GFR calc non Af Amer: >60 mL/min (ref >60)
Glucose, Bld: 357 mg/dL — ABNORMAL HIGH (ref 70–99)
Potassium: 4 mmol/L (ref 3.5–5.1)
Sodium: 133 mmol/L — ABNORMAL LOW (ref 135–145)
Total Bilirubin: 0.6 mg/dL (ref 0.3–1.2)
Total Protein: 7 g/dL (ref 6.5–8.1)

## 2018-08-11 LAB — CBC WITH DIFFERENTIAL/PLATELET
Abs Immature Granulocytes: 0.03 10*3/uL (ref 0.00–0.07)
Basophils Absolute: 0 10*3/uL (ref 0.0–0.1)
Basophils Relative: 0 %
Eosinophils Absolute: 0.1 10*3/uL (ref 0.0–0.5)
Eosinophils Relative: 1 %
HCT: 36.3 % — ABNORMAL LOW (ref 39.0–52.0)
Hemoglobin: 12.1 g/dL — ABNORMAL LOW (ref 13.0–17.0)
Immature Granulocytes: 0 %
Lymphocytes Relative: 6 %
Lymphs Abs: 0.5 10*3/uL — ABNORMAL LOW (ref 0.7–4.0)
MCH: 27.8 pg (ref 26.0–34.0)
MCHC: 33.3 g/dL (ref 30.0–36.0)
MCV: 83.3 fL (ref 80.0–100.0)
Monocytes Absolute: 0.7 10*3/uL (ref 0.1–1.0)
Monocytes Relative: 10 %
Neutro Abs: 5.9 10*3/uL (ref 1.7–7.7)
Neutrophils Relative %: 83 %
Platelets: 162 10*3/uL (ref 150–400)
RBC: 4.36 MIL/uL (ref 4.22–5.81)
RDW: 14.1 % (ref 11.5–15.5)
WBC: 7.2 10*3/uL (ref 4.0–10.5)
nRBC: 0 % (ref 0.0–0.2)

## 2018-08-11 LAB — LACTIC ACID, PLASMA: Lactic Acid, Venous: 1.8 mmol/L (ref 0.5–1.9)

## 2018-08-11 LAB — TROPONIN I (HIGH SENSITIVITY): Troponin I (High Sensitivity): 15 ng/L (ref <18)

## 2018-08-11 MED ORDER — SODIUM CHLORIDE 0.9 % IV BOLUS
1000.0000 mL | Freq: Once | INTRAVENOUS | Status: AC
  Administered 2018-08-12: 1000 mL via INTRAVENOUS

## 2018-08-11 NOTE — ED Triage Notes (Signed)
Pt c/o generalized weakness since noon today. Pt states he had a hormone shot today at cancer center.

## 2018-08-11 NOTE — ED Provider Notes (Signed)
[facility] EMERGENCY DEPARTMENT Provider Note   CSN: [id number] Arrival date & time: 08/11/18  2223     History   Chief Complaint Chief Complaint  Patient presents with  . Weakness    HPI George [name] is a 73 y.o. male.     Patient with a history of prostate cancer presenting with generalized weakness since about noon today.  States, feeling bad all over with weakness in his arms and especially his legs.  This started shortly after he received a "hormone shot" at the cancer center today.  He gets the shot on a regular basis every month without a problem.  He is never had this kind of reaction in the past.  He is not getting any chronic chemotherapy or radiation now.  He denies any fevers, chills, nausea.  He has had several episodes of diarrhea today.  No vomiting.  No pain with urination or blood in the urine.  Denies any cough, runny nose or sore throat.  He does have some dizziness and lightheadedness with standing.  Denies any falls or trauma.  Denies any focal weakness, difficulty speaking or difficulty swallowing.  Denies any chest pain or shortness of breath.  The history is provided by the patient.  Weakness Associated symptoms: arthralgias, dizziness and myalgias   Associated symptoms: no abdominal pain, no chest pain, no cough, no dysuria, no headaches, no nausea, no shortness of breath and no vomiting     Past Medical History:  Diagnosis Date  . Acute upper respiratory infections of unspecified site 2 weeks ago as of 11-18-17, resolved now  . Cancer [facility])    Prostate  . Chronic back pain    buldging disc and inflammatory arthritis  . Colitis, enteritis, and gastroenteritis of presumed infectious origin   . Confusion    occasionally short term  . Coronary artery disease   . Dermatophytosis of the body   . Diabetes mellitus without complication ([place])    takes Lantus   . Diabetes type 2, controlled ([place])    type 2  . Dizziness    occ with fast head movement  .  Eczema    right lower leg  . Generalized osteoarthrosis, unspecified site   . GERD (gastroesophageal reflux disease)   . Herpes zoster without mention of complication   . History of bronchitis many yrs ago  . History of claustrophobia   . HOH (hard of hearing)    both ears  . Joint pain   . Lumbago   . Other dyspnea and respiratory abnormality    with exertion  . Pleurisy without mention of effusion or current tuberculosis   . Prostate cancer ([place])   . Psoriasis and similar disorder   . Pure hypercholesterolemia    takes Atorvastatin daily  . Sinusitis    takes Allegra nightly and FLonase prn  . Unspecified essential hypertension    takes Amlodipine,Losartan,CLonidine,Lisinopril,and Metoprolol daily    Patient Active Problem List   Diagnosis Date Noted  . Malignant neoplasm of prostate ([place]) 05/01/2017  . Uncontrolled type 2 diabetes mellitus with hyperglycemia ([place]) 02/26/2017  . Mixed hyperlipidemia 02/26/2017  . Essential hypertension, benign 02/26/2017  . Thoracic ascending aortic aneurysm ([place]) 01/02/2017  . 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 CAD (coronary artery disease) of artery bypass graft 10/26/2012  . Chest pain 08/05/2012  . Abnormal stress test 08/05/2012  . HTN (hypertension) 08/05/2012    Past Surgical History:  Procedure Laterality Date  . CATARACT EXTRACTION W/PHACO  Left 04/04/2014   Procedure: CATARACT EXTRACTION PHACO AND INTRAOCULAR LENS PLACEMENT (IOC);  Surgeon: Williams Che, MD;  Location: [facility];  Service: Ophthalmology;  Laterality: Left;  CDE:4.49  . CATARACT EXTRACTION W/PHACO Right 01/02/2015   Procedure: CATARACT EXTRACTION PHACO AND INTRAOCULAR LENS PLACEMENT; CDE:  6.36;  Surgeon: Williams Che, MD;  Location: [facility];  Service: Ophthalmology;  Laterality: Right;  . CIRCUMCISION  2010  . CORONARY ARTERY BYPASS GRAFT N/A 08/13/2012   Procedure: CORONARY ARTERY BYPASS GRAFTING (CABG);  Surgeon: Ivin Poot, MD;  Location: [place];  Service:  Open Heart Surgery;  Laterality: N/A;  . ENDOVEIN HARVEST OF GREATER SAPHENOUS VEIN Right 08/13/2012   Procedure: ENDOVEIN HARVEST OF GREATER SAPHENOUS VEIN;  Surgeon: Ivin Poot, MD;  Location: [place];  Service: Open Heart Surgery;  Laterality: Right;  . INTRAOPERATIVE TRANSESOPHAGEAL ECHOCARDIOGRAM N/A 08/13/2012   Procedure: INTRAOPERATIVE TRANSESOPHAGEAL ECHOCARDIOGRAM;  Surgeon: Ivin Poot, MD;  Location: [place];  Service: Open Heart Surgery;  Laterality: N/A;  . LEFT HEART CATHETERIZATION WITH CORONARY ANGIOGRAM N/A 08/07/2012   Procedure: LEFT HEART CATHETERIZATION WITH CORONARY ANGIOGRAM;  Surgeon: Peter M Martinique, MD;  Location: [facility] C[facility] LAB;  Service: Cardiovascular;  Laterality: N/A;  . RADIOACTIVE SEED IMPLANT N/A 11/25/2017   Procedure: RADIOACTIVE SEED IMPLANT/BRACHYTHERAPY IMPLANT;  Surgeon: Irine Seal, MD;  Location[facility];  Service: Urology;  Laterality: N/A;  . SEED IMPLANT DONE AT [place]  01/2017        Home Medications    Prior to Admission medications   Medication Sig Start Date End Date Taking? Authorizing Provider  amLODipine (NORVASC) 5 MG tablet Take 1 tablet by mouth daily. 10/21/14  Yes [provider]  atorvastatin (LIPITOR) 20 MG tablet Take 20 mg by mouth daily.   Yes [provider]  cloNIDine (CATAPRES) 0.1 MG tablet Take 1 tablet by mouth daily. 10/21/14  Yes [provider]  losartan (COZAAR) 100 MG tablet Take 1 tablet by mouth daily. 08/04/12  Yes [provider]  tamsulosin (FLOMAX) 0.4 MG CAPS capsule Take 1 capsule (0.4 mg total) by mouth daily after supper. 12/10/17  Yes Bruning, Ashlyn, PA-C  Insulin Glargine (LANTUS SOLOSTAR) 100 UNIT/ML Solostar Pen Inject 80 Units into the skin at bedtime.     [provider]  Metoprolol Succinate 100 MG CS24 Take by mouth.    [provider]  NOVOLOG FLEXPEN 100 UNIT/ML FlexPen INJECT 25-31 UNITS INTO THE SKIN THREE TIMES DAILY BEFORE  MEALS 09/18/17   Nida, Marella Chimes, MD    Family History Family History  Problem Relation Age of Onset  . CAD Other   . Heart attack Other   . Diabetes Other   . Cataracts Mother   . Amblyopia Neg Hx   . Blindness Neg Hx   . Glaucoma Neg Hx   . Macular degeneration Neg Hx   . Retinal detachment Neg Hx   . Strabismus Neg Hx   . Retinitis pigmentosa Neg Hx     Social History Social History   Tobacco Use  . Smoking status: Former Smoker    Packs/day: 1.00    Years: 4.00    Pack years: 4.00    Types: Cigarettes    Start date: 03/04/1965    Quit date: 03/05/1967    Years since quitting: 51.4  . Smokeless tobacco: Never Used  Substance Use Topics  . Alcohol use: No    Alcohol/week: 0.0 standard drinks  . Drug  use: No     Allergies   Patient has no known allergies.   Review of Systems Review of Systems  Constitutional: Positive for activity change, appetite change and fatigue.  HENT: Negative for congestion and rhinorrhea.   Eyes: Negative for visual disturbance.  Respiratory: Negative for cough, chest tightness and shortness of breath.   Cardiovascular: Negative for chest pain and leg swelling.  Gastrointestinal: Negative for abdominal pain, nausea and vomiting.  Genitourinary: Negative for dysuria and hematuria.  Musculoskeletal: Positive for arthralgias and myalgias.  Neurological: Positive for dizziness, weakness and light-headedness. Negative for numbness and headaches.    all other systems are negative except as noted in the HPI and PMH.    Physical Exam Updated Vital Signs BP 133/88   Pulse (!) 116   Temp 99.8 F (37.7 C) (Oral)   Resp 18   SpO2 97%   Physical Exam Vitals signs and nursing note reviewed.  Constitutional:      General: He is not in acute distress.    Appearance: He is well-developed. He is obese.     Comments: Dry mucous membranes  HENT:     Head: Normocephalic and atraumatic.     Mouth/Throat:     Pharynx: No oropharyngeal  exudate.  Eyes:     Conjunctiva/sclera: Conjunctivae normal.     Pupils: Pupils are equal, round, and reactive to light.  Neck:     Musculoskeletal: Normal range of motion and neck supple.     Comments: No meningismus. Cardiovascular:     Rate and Rhythm: Regular rhythm. Tachycardia present.     Heart sounds: Normal heart sounds. No murmur.  Pulmonary:     Effort: Pulmonary effort is normal. No respiratory distress.     Breath sounds: Normal breath sounds.  Abdominal:     Palpations: Abdomen is soft.     Tenderness: There is no abdominal tenderness. There is no guarding or rebound.  Musculoskeletal: Normal range of motion.        General: No tenderness.  Skin:    General: Skin is warm.     Capillary Refill: Capillary refill takes less than 2 seconds.  Neurological:     General: No focal deficit present.     Mental Status: He is alert and oriented to person, place, and time. Mental status is at baseline.     Cranial Nerves: No cranial nerve deficit.     Motor: No abnormal muscle tone.     Coordination: Coordination normal.     Comments: No ataxia on finger to nose bilaterally. No pronator drift. 5/5 strength throughout. CN 2-12 intact.Equal grip strength. Sensation intact.   Psychiatric:        Behavior: Behavior normal.      ED Treatments / Results  Labs (all labs ordered are listed, but only abnormal results are displayed) Labs Reviewed  CBC WITH DIFFERENTIAL/PLATELET - Abnormal; Notable for the following components:      Result Value   Hemoglobin 12.1 (*)    HCT 36.3 (*)    Lymphs Abs 0.5 (*)    All other components within normal limits  COMPREHENSIVE METABOLIC PANEL - Abnormal; Notable for the following components:   Sodium 133 (*)    CO2 21 (*)    Glucose, Bld 357 (*)    Calcium 8.8 (*)    All other components within normal limits  URINALYSIS, ROUTINE W REFLEX MICROSCOPIC - Abnormal; Notable for the following components:   Glucose, UA >=500 (*)  Hgb urine  dipstick SMALL (*)    All other components within normal limits  SARS CORONAVIRUS 2 (HOSPITAL ORDER, [place] [facility])  CULTURE, BLOOD (ROUTINE X 2)  CULTURE, BLOOD (ROUTINE X 2)  URINE CULTURE  LACTIC ACID, PLASMA  LACTIC ACID, PLASMA  TROPONIN I (HIGH SENSITIVITY)  TROPONIN I (HIGH SENSITIVITY)    EKG EKG Interpretation  Date/Time:  Tuesday August 11 2018 23:14:44 EDT Ventricular Rate:  103 PR Interval:    QRS Duration: 112 QT Interval:  362 QTC Calculation: 474 R Axis:   -61 Text Interpretation:  Sinus tachycardia Left anterior fascicular block Anterior infarct, old Rate faster Confirmed by Ezequiel Essex [phone]) on 08/12/2018 12:35:19 AM   Radiology Dg Chest 2 View  Result Date: 08/12/2018 CLINICAL DATA:  Initial evaluation for acute weakness. EXAM: CHEST - 2 VIEW COMPARISON:  Prior radiograph from 11/03/2017 FINDINGS: Median sternotomy wires underlying CABG markers and surgical clips noted. Mild cardiomegaly, stable. Mediastinal silhouette within normal limits. Lungs normally inflated. No focal infiltrates. Mild perihilar vascular congestion without pulmonary edema. No visible pleural effusion. No pneumothorax. No acute osseous abnormality. Multilevel degenerative spurring noted throughout the visualized spine. IMPRESSION: 1. Mild perihilar vascular congestion without overt pulmonary edema. 2. No other active cardiopulmonary disease. 3. Sequelae of prior CABG. Electronically Signed   By: Jeannine Boga M.D.   On: 08/12/2018 02:45   Ct Head Wo Contrast  Result Date: 08/12/2018 CLINICAL DATA:  Chronic headache, generalized weakness EXAM: CT HEAD WITHOUT CONTRAST TECHNIQUE: Contiguous axial images were obtained from the base of the skull through the vertex without intravenous contrast. COMPARISON:  None. FINDINGS: Brain: No acute territorial infarction, hemorrhage or intracranial mass. Mild atrophy. Slight asymmetric enlargement of left lateral ventricle  compared to the right. Minimal small vessel ischemic change of the white matter. Vascular: No hyperdense vessels. Vertebral and carotid vascular calcification Skull: Normal. Negative for fracture or focal lesion. Sinuses/Orbits: No acute finding. Other: None IMPRESSION: 1. No CT evidence for acute intracranial abnormality. 2. Atrophy and minimal small vessel ischemic changes of the white matter Electronically Signed   By: Donavan Foil M.D.   On: 08/12/2018 02:42    Procedures Procedures (including critical care time)  Medications Ordered in ED Medications  sodium chloride 0.9 % bolus 1,000 mL (has no administration in time range)     Initial Impression / Assessment and Plan / ED Course  I have reviewed the triage vital signs and the nursing notes.  Pertinent labs & imaging results that were available during my care of the patient were reviewed by me and considered in my medical decision making (see chart for details).       Generalized weakness without focal deficits.  Patient's temperature is 99.8 and is mildly tachycardic Orthostatics positive by heart rates.  He is given IV fluids. No focal neurological deficits.  Patient will be hydrated and infectious work-up will be undertaken.  Workup reassuring.  Troponin negative x2. Low suspicion for ACS. No chest pain or SOB. Labs show hyperglycemia without DKA. Corona virus testing negative.  UA negative. CXR negative. CT head negative.  Patient tolerating p.o. and ambulatory.  He states he no longer feels weak.  Denies any dizziness or lightheadedness.  No chest pain or shortness of breath.  Etiology is negative for infectious work-up.  His heart rate has improved to the 90s. Weakness was generalized. There is low suspicion for TIA or CVA. Tolerating PO and ambulatory. Advised to keep himself hydrated in the  hot weather. Return precautions discussed.   BP (!) 147/60   Pulse 88   Temp 98.3 F (36.8 C) (Oral)   Resp 18   SpO2 98%    Final Clinical Impressions(s) / ED Diagnoses   Final diagnoses:  Weakness  Orthostasis    ED Discharge Orders    None       Tahir Blank, Annie Main, MD 08/12/18 [phone]

## 2018-08-12 ENCOUNTER — Emergency Department (HOSPITAL_COMMUNITY): Payer: Medicare Other

## 2018-08-12 LAB — URINALYSIS, ROUTINE W REFLEX MICROSCOPIC
Bacteria, UA: NONE SEEN
Bilirubin Urine: NEGATIVE
Glucose, UA: >=500 mg/dL — AB
Ketones, ur: NEGATIVE mg/dL
Leukocytes,Ua: NEGATIVE
Nitrite: NEGATIVE
Protein, ur: NEGATIVE mg/dL
Specific Gravity, Urine: 1.029 (ref 1.005–1.030)
pH: 5 (ref 5.0–8.0)

## 2018-08-12 LAB — LACTIC ACID, PLASMA: Lactic Acid, Venous: 1.6 mmol/L (ref 0.5–1.9)

## 2018-08-12 LAB — TROPONIN I (HIGH SENSITIVITY): Troponin I (High Sensitivity): 16 ng/L (ref <18)

## 2018-08-12 LAB — SARS CORONAVIRUS 2 BY RT PCR (HOSPITAL ORDER, PERFORMED IN ~~LOC~~ HOSPITAL LAB): SARS Coronavirus 2: NEGATIVE

## 2018-08-12 MED ORDER — SODIUM CHLORIDE 0.9 % IV BOLUS
1000.0000 mL | Freq: Once | INTRAVENOUS | Status: AC
  Administered 2018-08-12: 1000 mL via INTRAVENOUS

## 2018-08-12 NOTE — ED Notes (Addendum)
Pt tolerated PO fluids well. NAD at this time

## 2018-08-12 NOTE — Discharge Instructions (Signed)
Your testing is reassuring.  No evidence of infection or heart attack.  Keep yourself hydrated.  Follow-up with your doctor.  Return to the ED with chest pain, shortness of breath, focal weakness or any other concerns.

## 2018-08-12 NOTE — ED Notes (Signed)
Pt ambulated to BR and around room without any assistance or difficulties.

## 2018-08-13 LAB — URINE CULTURE: Culture: NO GROWTH

## 2018-08-16 LAB — CULTURE, BLOOD (ROUTINE X 2)
Culture: NO GROWTH
Special Requests: ADEQUATE

## 2018-08-17 LAB — CULTURE, BLOOD (ROUTINE X 2)
Culture: NO GROWTH
Special Requests: ADEQUATE

## 2018-10-02 ENCOUNTER — Other Ambulatory Visit: Payer: Self-pay

## 2018-10-02 ENCOUNTER — Ambulatory Visit (INDEPENDENT_AMBULATORY_CARE_PROVIDER_SITE_OTHER): Payer: Medicare Other | Admitting: Urology

## 2018-10-02 DIAGNOSIS — C61 Malignant neoplasm of prostate: Secondary | ICD-10-CM

## 2018-10-02 DIAGNOSIS — Z79899 Other long term (current) drug therapy: Secondary | ICD-10-CM

## 2018-11-04 ENCOUNTER — Ambulatory Visit (INDEPENDENT_AMBULATORY_CARE_PROVIDER_SITE_OTHER): Payer: Medicare Other | Admitting: Urology

## 2018-11-04 DIAGNOSIS — C61 Malignant neoplasm of prostate: Secondary | ICD-10-CM | POA: Diagnosis not present

## 2018-12-09 ENCOUNTER — Other Ambulatory Visit: Payer: Self-pay

## 2018-12-09 DIAGNOSIS — Z20822 Contact with and (suspected) exposure to covid-19: Secondary | ICD-10-CM

## 2018-12-10 LAB — NOVEL CORONAVIRUS, NAA: SARS-CoV-2, NAA: DETECTED — AB

## 2018-12-11 ENCOUNTER — Telehealth: Payer: Self-pay | Admitting: Unknown Physician Specialty

## 2018-12-11 ENCOUNTER — Other Ambulatory Visit: Payer: Self-pay | Admitting: Unknown Physician Specialty

## 2018-12-11 DIAGNOSIS — U071 COVID-19: Secondary | ICD-10-CM

## 2018-12-11 NOTE — Telephone Encounter (Signed)
Pt is reporting symptoms of cough, sore throat, fever, loss of smell, muscle aches/body aches, flu-k=like illness, diarrhea but no SOB  He does have diabetes and s/p open heart surgery.  He is interested in Monoclonal antibody treatment.  Messaged Jena Gauss for the infusion center on Monday

## 2018-12-14 ENCOUNTER — Ambulatory Visit (HOSPITAL_COMMUNITY): Payer: Medicare Other

## 2019-01-21 DIAGNOSIS — I10 Essential (primary) hypertension: Secondary | ICD-10-CM | POA: Diagnosis not present

## 2019-01-21 DIAGNOSIS — E119 Type 2 diabetes mellitus without complications: Secondary | ICD-10-CM | POA: Diagnosis not present

## 2019-01-29 DIAGNOSIS — I10 Essential (primary) hypertension: Secondary | ICD-10-CM | POA: Diagnosis not present

## 2019-01-29 DIAGNOSIS — E1165 Type 2 diabetes mellitus with hyperglycemia: Secondary | ICD-10-CM | POA: Diagnosis not present

## 2019-01-29 DIAGNOSIS — H353290 Exudative age-related macular degeneration, unspecified eye, stage unspecified: Secondary | ICD-10-CM | POA: Diagnosis not present

## 2019-01-29 DIAGNOSIS — Z87891 Personal history of nicotine dependence: Secondary | ICD-10-CM | POA: Diagnosis not present

## 2019-01-29 DIAGNOSIS — E1151 Type 2 diabetes mellitus with diabetic peripheral angiopathy without gangrene: Secondary | ICD-10-CM | POA: Diagnosis not present

## 2019-01-29 DIAGNOSIS — Z299 Encounter for prophylactic measures, unspecified: Secondary | ICD-10-CM | POA: Diagnosis not present

## 2019-01-29 DIAGNOSIS — E119 Type 2 diabetes mellitus without complications: Secondary | ICD-10-CM | POA: Diagnosis not present

## 2019-02-01 ENCOUNTER — Encounter: Payer: Self-pay | Admitting: Urology

## 2019-02-02 DIAGNOSIS — Z299 Encounter for prophylactic measures, unspecified: Secondary | ICD-10-CM | POA: Diagnosis not present

## 2019-02-02 DIAGNOSIS — I10 Essential (primary) hypertension: Secondary | ICD-10-CM | POA: Diagnosis not present

## 2019-02-02 DIAGNOSIS — E1151 Type 2 diabetes mellitus with diabetic peripheral angiopathy without gangrene: Secondary | ICD-10-CM | POA: Diagnosis not present

## 2019-02-02 DIAGNOSIS — E1165 Type 2 diabetes mellitus with hyperglycemia: Secondary | ICD-10-CM | POA: Diagnosis not present

## 2019-02-02 DIAGNOSIS — Z713 Dietary counseling and surveillance: Secondary | ICD-10-CM | POA: Diagnosis not present

## 2019-02-09 DIAGNOSIS — E1159 Type 2 diabetes mellitus with other circulatory complications: Secondary | ICD-10-CM | POA: Diagnosis not present

## 2019-02-09 DIAGNOSIS — E119 Type 2 diabetes mellitus without complications: Secondary | ICD-10-CM | POA: Diagnosis not present

## 2019-02-09 DIAGNOSIS — E1140 Type 2 diabetes mellitus with diabetic neuropathy, unspecified: Secondary | ICD-10-CM | POA: Diagnosis not present

## 2019-02-09 DIAGNOSIS — M79605 Pain in left leg: Secondary | ICD-10-CM | POA: Diagnosis not present

## 2019-02-09 DIAGNOSIS — M79604 Pain in right leg: Secondary | ICD-10-CM | POA: Diagnosis not present

## 2019-02-25 DIAGNOSIS — Z299 Encounter for prophylactic measures, unspecified: Secondary | ICD-10-CM | POA: Diagnosis not present

## 2019-02-25 DIAGNOSIS — I10 Essential (primary) hypertension: Secondary | ICD-10-CM | POA: Diagnosis not present

## 2019-02-25 DIAGNOSIS — H353290 Exudative age-related macular degeneration, unspecified eye, stage unspecified: Secondary | ICD-10-CM | POA: Diagnosis not present

## 2019-02-25 DIAGNOSIS — E1151 Type 2 diabetes mellitus with diabetic peripheral angiopathy without gangrene: Secondary | ICD-10-CM | POA: Diagnosis not present

## 2019-03-05 DIAGNOSIS — E1151 Type 2 diabetes mellitus with diabetic peripheral angiopathy without gangrene: Secondary | ICD-10-CM | POA: Diagnosis not present

## 2019-03-05 DIAGNOSIS — I10 Essential (primary) hypertension: Secondary | ICD-10-CM | POA: Diagnosis not present

## 2019-03-05 DIAGNOSIS — E1165 Type 2 diabetes mellitus with hyperglycemia: Secondary | ICD-10-CM | POA: Diagnosis not present

## 2019-03-05 DIAGNOSIS — Z299 Encounter for prophylactic measures, unspecified: Secondary | ICD-10-CM | POA: Diagnosis not present

## 2019-03-08 DIAGNOSIS — I10 Essential (primary) hypertension: Secondary | ICD-10-CM | POA: Diagnosis not present

## 2019-03-08 DIAGNOSIS — E119 Type 2 diabetes mellitus without complications: Secondary | ICD-10-CM | POA: Diagnosis not present

## 2019-03-14 DIAGNOSIS — E119 Type 2 diabetes mellitus without complications: Secondary | ICD-10-CM | POA: Diagnosis not present

## 2019-04-02 DIAGNOSIS — E119 Type 2 diabetes mellitus without complications: Secondary | ICD-10-CM | POA: Diagnosis not present

## 2019-04-02 DIAGNOSIS — I10 Essential (primary) hypertension: Secondary | ICD-10-CM | POA: Diagnosis not present

## 2019-04-13 DIAGNOSIS — E119 Type 2 diabetes mellitus without complications: Secondary | ICD-10-CM | POA: Diagnosis not present

## 2019-04-27 ENCOUNTER — Ambulatory Visit: Payer: Medicare Other | Admitting: Urology

## 2019-04-27 NOTE — Progress Notes (Incomplete)
H&P  Chief Complaint: Prostate Cancer  History of Present Illness:   4.13.2021:  (below copied from [place] records):  George [name] is a 74 year-old male established patient who is here evaluation for treatment of prostate cancer.  His prostate cancer was diagnosed 01/24/2017. He does have the pathology report from his biopsy. His cancer was diagnosed by Dr. Irine Seal. His PSA at his time of diagnosis was 6.5. His most recent PSA is <0.1.   Here for folowup of prostate cancer. He has T2a N0 M0 GS 8(5+3) cancer in a 27ml prostate. he had 9/12 cores positive. He was started on ADT on 05/23/17 and then got SpaceOAR and gold seeds in 8/19 and he completed the Radiation therapy in 9/19. He had a seed boost on 11/25/17.   9.18.2020: Last PSA (3.12.2020) < 0.1. He has not had firmagon since this last July -- we will resume him on monthly schedule. He tolerates ADT well with soreness only on the first night following an injection though he is also still experiencing hot flashes. He denies any blood per urine or stool. His urination remains stable -- he is still somewhat bothered by the severity of his nocturia. He denies any significant concerns or changes since last visit.     03/26/18 06/17/17 01/03/17 08/19/16  PSA  Total PSA < 0.1 ng/dl 1.3 ng/dl 6.5 ng/dl 6.6 ng/dl  % Free PSA   11 %     03/26/18 06/17/17  Hormones  Testosterone, Total < 10 pg/dL < 10 pg/dL    Past Medical History:  Diagnosis Date  . Acute upper respiratory infections of unspecified site 2 weeks ago as of 11-18-17, resolved now  . Cancer [facility])    Prostate  . Chronic back pain    buldging disc and inflammatory arthritis  . Colitis, enteritis, and gastroenteritis of presumed infectious origin   . Confusion    occasionally short term  . Coronary artery disease   . Dermatophytosis of the body   . Diabetes mellitus without complication ([place])    takes Lantus   . Diabetes type 2, controlled ([place])    type 2  . Dizziness      occ with fast head movement  . Eczema    right lower leg  . Generalized osteoarthrosis, unspecified site   . GERD (gastroesophageal reflux disease)   . Herpes zoster without mention of complication   . History of bronchitis many yrs ago  . History of claustrophobia   . HOH (hard of hearing)    both ears  . Joint pain   . Lumbago   . Other dyspnea and respiratory abnormality    with exertion  . Pleurisy without mention of effusion or current tuberculosis   . Prostate cancer ([place])   . Psoriasis and similar disorder   . Pure hypercholesterolemia    takes Atorvastatin daily  . Sinusitis    takes Allegra nightly and FLonase prn  . Unspecified essential hypertension    takes Amlodipine,Losartan,CLonidine,Lisinopril,and Metoprolol daily    Past Surgical History:  Procedure Laterality Date  . CATARACT EXTRACTION W/PHACO Left 04/04/2014   Procedure: CATARACT EXTRACTION PHACO AND INTRAOCULAR LENS PLACEMENT (IOC);  Surgeon: Williams Che, MD;  [facility]: [facility];  Service: Ophthalmology;  Laterality: Left;  CDE:4.49  . CATARACT EXTRACTION W/PHACO Right 01/02/2015   Procedure: CATARACT EXTRACTION PHACO AND INTRAOCULAR LENS PLACEMENT; CDE:  6.36;  Surgeon: Williams Che, MD;  [facility];  Service: Ophthalmology;  Laterality: Right;  .  CIRCUMCISION  2010  . CORONARY ARTERY BYPASS GRAFT N/A 08/13/2012   Procedure: CORONARY ARTERY BYPASS GRAFTING (CABG);  Surgeon: Ivin Poot, MD;  Location: [place];  Service: Open Heart Surgery;  Laterality: N/A;  . ENDOVEIN HARVEST OF GREATER SAPHENOUS VEIN Right 08/13/2012   Procedure: ENDOVEIN HARVEST OF GREATER SAPHENOUS VEIN;  Surgeon: Ivin Poot, MD;  Location: [place];  Service: Open Heart Surgery;  Laterality: Right;  . INTRAOPERATIVE TRANSESOPHAGEAL ECHOCARDIOGRAM N/A 08/13/2012   Procedure: INTRAOPERATIVE TRANSESOPHAGEAL ECHOCARDIOGRAM;  Surgeon: Ivin Poot, MD;  Location: [place];  Service: Open Heart Surgery;  Laterality: N/A;  .  LEFT HEART CATHETERIZATION WITH CORONARY ANGIOGRAM N/A 08/07/2012   Procedure: LEFT HEART CATHETERIZATION WITH CORONARY ANGIOGRAM;  Surgeon: Peter M Martinique, MD;  Location: [facility] CATH LAB;  Service: Cardiovascular;  Laterality: N/A;  . RADIOACTIVE SEED IMPLANT N/A 11/25/2017   Procedure: RADIOACTIVE SEED IMPLANT/BRACHYTHERAPY IMPLANT;  Surgeon: Irine Seal, MD;  Location: [facility];  Service: Urology;  Laterality: N/A;  . SEED IMPLANT DONE AT [place]  01/2017    Home Medications:  Allergies as of 04/27/2019   No Known Allergies     Medication List       Accurate as of April 27, 2019  1:16 PM. If you have any questions, ask your nurse or doctor.        amLODipine 5 MG tablet Commonly known as: NORVASC Take 1 tablet by mouth daily.   atorvastatin 20 MG tablet Commonly known as: LIPITOR Take 20 mg by mouth daily.   cloNIDine 0.1 MG tablet Commonly known as: CATAPRES Take 1 tablet by mouth daily.   Lantus SoloStar 100 UNIT/ML Solostar Pen Generic drug: insulin glargine Inject 80 Units into the skin at bedtime.   losartan 100 MG tablet Commonly known as: COZAAR Take 1 tablet by mouth daily.   Metoprolol Succinate 100 MG Cs24 Take by mouth.   NovoLOG FlexPen 100 UNIT/ML FlexPen Generic drug: insulin aspart INJECT 25-31 UNITS INTO THE SKIN THREE TIMES DAILY BEFORE MEALS   tamsulosin 0.4 MG Caps capsule Commonly known as: FLOMAX Take 1 capsule (0.4 mg total) by mouth daily after supper.       Allergies: No Known Allergies  Family History  Problem Relation Age of Onset  . CAD Other   . Heart attack Other   . Diabetes Other   . Cataracts Mother   . Amblyopia Neg Hx   . Blindness Neg Hx   . Glaucoma Neg Hx   . Macular degeneration Neg Hx   . Retinal detachment Neg Hx   . Strabismus Neg Hx   . Retinitis pigmentosa Neg Hx     Social History:  reports that he quit smoking about 52 years ago. His smoking use included cigarettes. He started smoking  about 54 years ago. He has a 4.00 pack-year smoking history. He has never used smokeless tobacco. He reports that he does not drink alcohol or use drugs.  ROS: A complete review of systems was performed.  All systems are negative except for pertinent findings as noted.  Physical Exam:  Vital signs in last 24 hours: There were no vitals taken for this visit. Constitutional:  Alert and oriented, No acute distress Cardiovascular: Regular rate  Respiratory: Normal respiratory effort GI: Abdomen is soft, nontender, nondistended, no abdominal masses. No CVAT.  Genitourinary: Normal male phallus, testes are descended bilaterally and non-tender and without masses, scrotum is normal in appearance without lesions or masses, perineum is normal  on inspection. Lymphatic: No lymphadenopathy Neurologic: Grossly intact, no focal deficits Psychiatric: Normal mood and affect  Laboratory Data:  No results for input(s): WBC, HGB, HCT, PLT in the last 72 hours.  No results for input(s): NA, K, CL, GLUCOSE, BUN, CALCIUM, CREATININE in the last 72 hours.  Invalid input(s): CO3   No results found for this or any previous visit (from the past 24 hour(s)). No results found for this or any previous visit (from the past 240 hour(s)).  Renal Function: No results for input(s): CREATININE in the last 168 hours. CrCl cannot be calculated (Patient's most recent lab result is older than the maximum 21 days allowed.).  Radiologic Imaging: No results found.  Impression/Assessment:  ***  Plan:  ***

## 2019-04-30 DIAGNOSIS — I10 Essential (primary) hypertension: Secondary | ICD-10-CM | POA: Diagnosis not present

## 2019-04-30 DIAGNOSIS — E119 Type 2 diabetes mellitus without complications: Secondary | ICD-10-CM | POA: Diagnosis not present

## 2019-05-10 ENCOUNTER — Encounter (INDEPENDENT_AMBULATORY_CARE_PROVIDER_SITE_OTHER): Payer: Medicare Other | Admitting: Ophthalmology

## 2019-05-10 DIAGNOSIS — H353211 Exudative age-related macular degeneration, right eye, with active choroidal neovascularization: Secondary | ICD-10-CM

## 2019-05-10 DIAGNOSIS — H26493 Other secondary cataract, bilateral: Secondary | ICD-10-CM

## 2019-05-10 DIAGNOSIS — D3132 Benign neoplasm of left choroid: Secondary | ICD-10-CM

## 2019-05-10 DIAGNOSIS — H353122 Nonexudative age-related macular degeneration, left eye, intermediate dry stage: Secondary | ICD-10-CM

## 2019-05-10 DIAGNOSIS — H43813 Vitreous degeneration, bilateral: Secondary | ICD-10-CM

## 2019-05-10 DIAGNOSIS — Z961 Presence of intraocular lens: Secondary | ICD-10-CM

## 2019-05-10 DIAGNOSIS — H3581 Retinal edema: Secondary | ICD-10-CM

## 2019-05-14 DIAGNOSIS — E119 Type 2 diabetes mellitus without complications: Secondary | ICD-10-CM | POA: Diagnosis not present

## 2019-05-14 NOTE — Progress Notes (Addendum)
[facility] Note  05/21/2019     CHIEF COMPLAINT Patient presents for Retina Follow Up   HISTORY OF PRESENT ILLNESS: George [name] is a 74 y.o. male who presents to the clinic today for:   HPI    Retina Follow Up    Patient presents with  Wet AMD.  In right eye.  This started years ago.  Duration of years.  Since onset it is gradually worsening.  I, the attending physician,  performed the HPI with the patient and updated documentation appropriately.          Comments    Pt states vision has gotten gradually worse over the last year and was afraid to make any appts or go anywhere due to COVID-19 Pandemic.  Patient denies eye pain or discomfort and denies any new or worsening floaters or fol OU.       Last edited by Bernarda Caffey, MD on 05/23/2019 11:38 PM. (History)    Pt lost to f/u since Jan 2020. Pt states he cannot see at night  Referring physician: Particia Nearing, [address],  [place] [address]  HISTORICAL INFORMATION:   Selected notes from the MEDICAL RECORD NUMBER Referred by Dr. Rocky Link for retinal hemorrhage LEE: 05.10.19 (R. Turner) [BCVA: OD: 20/20 OS: 20/20] Ocular Hx-Pseudophakia PMH-DM(on Lantus and Metformin), HTN, Arthritis    CURRENT MEDICATIONS: No current outpatient medications on file. (Ophthalmic Drugs)   No current facility-administered medications for this visit. (Ophthalmic Drugs)   Current Outpatient Medications (Other)  Medication Sig  . amLODipine (NORVASC) 5 MG tablet Take 1 tablet by mouth daily.  Marland Kitchen atorvastatin (LIPITOR) 20 MG tablet Take 20 mg by mouth daily.  . cloNIDine (CATAPRES) 0.1 MG tablet Take 1 tablet by mouth daily.  . Insulin Glargine (LANTUS SOLOSTAR) 100 UNIT/ML Solostar Pen Inject 80 Units into the skin at bedtime.   Marland Kitchen losartan (COZAAR) 100 MG tablet Take 1 tablet by mouth daily.  . Metoprolol Succinate 100 MG CS24 Take by mouth.  Marland Kitchen NOVOLOG FLEXPEN 100 UNIT/ML FlexPen  INJECT 25-31 UNITS INTO THE SKIN THREE TIMES DAILY BEFORE MEALS  . tamsulosin (FLOMAX) 0.4 MG CAPS capsule Take 1 capsule (0.4 mg total) by mouth daily after supper.   Current Facility-Administered Medications (Other)  Medication Route  . Bevacizumab (AVASTIN) SOLN 1.25 mg Intravitreal  . Bevacizumab (AVASTIN) SOLN 1.25 mg Intravitreal  . Bevacizumab (AVASTIN) SOLN 1.25 mg Intravitreal  . Bevacizumab (AVASTIN) SOLN 1.25 mg Intravitreal  . Bevacizumab (AVASTIN) SOLN 1.25 mg Intravitreal      REVIEW OF SYSTEMS: ROS    Positive for: Eyes   Negative for: Constitutional, Gastrointestinal, Neurological, Skin, Genitourinary, Musculoskeletal, HENT, Endocrine, Cardiovascular, Respiratory, Psychiatric, Allergic/Imm, Heme/Lymph   Last edited by Doneen Poisson on 05/21/2019  2:30 PM. (History)       ALLERGIES No Known Allergies  PAST MEDICAL HISTORY Past Medical History:  Diagnosis Date  . Acute upper respiratory infections of unspecified site 2 weeks ago as of 11-18-17, resolved now  . Cancer [facility])    Prostate  . Chronic back pain    buldging disc and inflammatory arthritis  . Colitis, enteritis, and gastroenteritis of presumed infectious origin   . Confusion    occasionally short term  . Coronary artery disease   . Dermatophytosis of the body   . Diabetes mellitus without complication ([place])    takes Lantus   . Diabetes type 2, controlled ([place])  type 2  . Dizziness    occ with fast head movement  . Eczema    right lower leg  . Generalized osteoarthrosis, unspecified site   . GERD (gastroesophageal reflux disease)   . Herpes zoster without mention of complication   . History of bronchitis many yrs ago  . History of claustrophobia   . HOH (hard of hearing)    both ears  . Joint pain   . Lumbago   . Other dyspnea and respiratory abnormality    with exertion  . Pleurisy without mention of effusion or current tuberculosis   . Prostate cancer ([place])   . Psoriasis and  similar disorder   . Pure hypercholesterolemia    takes Atorvastatin daily  . Sinusitis    takes Allegra nightly and FLonase prn  . Unspecified essential hypertension    takes Amlodipine,Losartan,CLonidine,Lisinopril,and Metoprolol daily   Past Surgical History:  Procedure Laterality Date  . CATARACT EXTRACTION W/PHACO Left 04/04/2014   Procedure: CATARACT EXTRACTION PHACO AND INTRAOCULAR LENS PLACEMENT (IOC);  Surgeon: Williams Che, MD;  Location: [facility];  Service: Ophthalmology;  Laterality: Left;  CDE:4.49  . CATARACT EXTRACTION W/PHACO Right 01/02/2015   Procedure: CATARACT EXTRACTION PHACO AND INTRAOCULAR LENS PLACEMENT; CDE:  6.36;  Surgeon: Williams Che, MD;  Location: [facility];  Service: Ophthalmology;  Laterality: Right;  . CIRCUMCISION  2010  . CORONARY ARTERY BYPASS GRAFT N/A 08/13/2012   Procedure: CORONARY ARTERY BYPASS GRAFTING (CABG);  Surgeon: Ivin Poot, MD;  Location: [facility];  Service: Open Heart Surgery;  Laterality: N/A;  . ENDOVEIN HARVEST OF GREATER SAPHENOUS VEIN Right 08/13/2012   Procedure: ENDOVEIN HARVEST OF GREATER SAPHENOUS VEIN;  Surgeon: Ivin Poot, MD;  Location: [facility];  Service: Open Heart Surgery;  Laterality: Right;  . INTRAOPERATIVE TRANSESOPHAGEAL ECHOCARDIOGRAM N/A 08/13/2012   Procedure: INTRAOPERATIVE TRANSESOPHAGEAL ECHOCARDIOGRAM;  Surgeon: Ivin Poot, MD;  Location: [facility];  Service: Open Heart Surgery;  Laterality: N/A;  . LEFT HEART CATHETERIZATION WITH CORONARY ANGIOGRAM N/A 08/07/2012   Procedure: LEFT HEART CATHETERIZATION WITH CORONARY ANGIOGRAM;  Surgeon: Peter M Martinique, MD;  Location: [facility] CATH LAB;  Service: Cardiovascular;  Laterality: N/A;  . RADIOACTIVE SEED IMPLANT N/A 11/25/2017   Procedure: RADIOACTIVE SEED IMPLANT/BRACHYTHERAPY IMPLANT;  Surgeon: Irine Seal, MD;  Location: [facility];  Service: Urology;  Laterality: N/A;  . SEED IMPLANT DONE AT   01/2017    FAMILY HISTORY Family History   Problem Relation Age of Onset  . CAD Other   . Heart attack Other   . Diabetes Other   . Cataracts Mother   . Amblyopia Neg Hx   . Blindness Neg Hx   . Glaucoma Neg Hx   . Macular degeneration Neg Hx   . Retinal detachment Neg Hx   . Strabismus Neg Hx   . Retinitis pigmentosa Neg Hx     SOCIAL HISTORY Social History   Tobacco Use  . Smoking status: Former Smoker    Packs/day: 1.00    Years: 4.00    Pack years: 4.00    Types: Cigarettes    Start date: 03/04/1965    Quit date: 03/05/1967    Years since quitting: 52.2  . Smokeless tobacco: Never Used  Substance Use Topics  . Alcohol use: No    Alcohol/week: 0.0 standard drinks  . Drug use: No         OPHTHALMIC EXAM:  Base Eye Exam    Visual Acuity (Snellen -  Linear)      Right Left   Dist [place] 20/25 20/50 +2   Dist ph [place]  20/40 -1       Tonometry (Tonopen, 2:38 PM)      Right Left   Pressure 15 13       Pupils      Dark Light Shape React APD   Right 3 2 Round Brisk 0   Left 3 2 Round Brisk 0       Visual Fields      Left Right    Full Full       Extraocular Movement      Right Left    Full Full       Neuro/Psych    Oriented x3: Yes   Mood/Affect: Normal       Dilation    Both eyes: 1.0% Mydriacyl, 2.5% Phenylephrine @ 2:38 PM        Slit Lamp and Fundus Exam    Slit Lamp Exam      Right Left   Lids/Lashes Dermatochalasis - upper lid Dermatochalasis - upper lid   Conjunctiva/Sclera Temporal and Nasal Pinguecula Temporal and Nasal Pinguecula   Cornea 1+ Inferior Punctate epithelial erosions, Arcus, superior cataract wounds with refractile deposites, trace Debris in tear film Arcus, superior cataract wounds well healed, trace Debris in tear film   Anterior Chamber Deep and quiet, no cell or flare Deep and quiet, no cell or flare   Iris Round and dilated, No NVI Round and dilated, No NVI   Lens PC IOL in good position, open PC PC IOL in good position, 1+ non-central PCO   Vitreous Vitreous  syneresis, Posterior vitreous detachment Vitreous syneresis, Posterior vitreous detachment       Fundus Exam      Right Left   Disc 360 Peripapillary atrophy, CNV surrounding pigment clumping - no edema surrounding, no heme Sharp, 360 Peripapillary atrophy, mild Pallor   C/D Ratio 0.4 0.3   Macula Flat, Blunted foveal reflex, Drusen, RPE mottling and clumping, interval progression of Atrophy, no heme Flat, Blunted foveal reflex, RPE mottling and clumping, Drusen, No heme or edema, interval development of perifoveal ring of Atrophy   Vessels Vascular attenuation, AV crossing changes, Tortuous Mild Vascular attenuation, mild AV crossing changes   Periphery Attached Attached, irregular pigmented lesion along ST arcade about 1.5DD area - flat, focal blot heme nasal to disc        Refraction    Manifest Refraction      Sphere Cylinder Dist VA   Right -0.50 Sphere 20/25   Left -0.75 Sphere 20/40          IMAGING AND PROCEDURES  Imaging and Procedures for @TODAY @  OCT, Retina - OU - Both Eyes       Right Eye Quality was good. Central Foveal Thickness: 291. Progression has worsened. Findings include normal foveal contour, retinal drusen , outer retinal atrophy, pigment epithelial detachment, no IRF, no SRF (Persistent SRHM / PED nasal to disc - stable from prior; mild progression of ORA; focal ellipsoid loss inferior to fovea).   Left Eye Quality was good. Central Foveal Thickness: 302. Progression has worsened. Findings include normal foveal contour, no IRF, no SRF, retinal drusen , pigment epithelial detachment, outer retinal atrophy (Interval development of ORA/GA inferior to fovea).   Notes *Images captured and stored on drive  Diagnosis / Impression:  Exudative ARMD OD -- Persistent SRHM / PED nasal to disc w/o IRF/SRF - stable from  prior; mild progression of ORA; focal ellipsoid loss inferior to fovea  Nonexudative ARMD OS -- Interval development of ORA/GA inferior to  fovea   Clinical management:  See below  Abbreviations: NFP - Normal foveal profile. CME - cystoid macular edema. PED - pigment epithelial detachment. IRF - intraretinal fluid. SRF - subretinal fluid. EZ - ellipsoid zone. ERM - epiretinal membrane. ORA - outer retinal atrophy. ORT - outer retinal tubulation. SRHM - subretinal hyper-reflective material                  ASSESSMENT/PLAN:    ICD-10-CM   1. Exudative age-related macular degeneration of right eye with active choroidal neovascularization (HCC)  H35.3211 CANCELED: Intravitreal Injection, Pharmacologic Agent - OD - Right Eye  2. Retinal edema  H35.81 OCT, Retina - OU - Both Eyes  3. Intermediate stage nonexudative age-related macular degeneration of left eye  H35.3122   4. Nevus of choroid of left eye  D31.32   5. Posterior vitreous detachment of both eyes  H43.813   6. Pseudophakia of both eyes  Z96.1   7. PCO (posterior capsular opacification), bilateral  H26.493     1,2. Exudative age related macular degeneration, OD  - pt lost to f/u since Jan 2020 - delayed follow up from 01.15.20 to 05.07.21 due to Covid-19  - peripapillary CNVM with +subretinal heme and edema, nasal disc -- improved today with no active SRF or heme  - S/P IVA OD #1 (05.15.19), #2 (07.01.19), #3 (08.13.19), #4 (09.30.19), #5 (01.15.20)  - OCT shows OD -- Persistent SRHM / PED nasal to disc without overlying fluid - stable from prior;    mild progression of ORA w/ focal ellipsoid loss inferior to fovea  - BCVA today improved to 20/25 from 20/30 OD, stable  - discussed findings and the importance of regular f/u and AREDS 2 supplements  - recommend holding off on IVA OD today - pt in agreement   - cont AREDS 2 supplements and [place] grid monitoring  - F/U 2-3 months  3. Age related macular degeneration, non-exudative, OS  - The incidence, anatomy, and pathology of dry AMD, risk of progression, and the AREDS and AREDS 2 study including smoking  risks discussed with patient.  - OCT shows interval progression of ORA / GA  - Will continue to monitor  - recommend AREDS supplements and Amsler grid monitoring  4. Choroidal nevus OS-  - flat pigmented lesion along sup temp arcade  - no suspcious features -- low concern for choroidal melanoma  - monitor  5. PVD / vitreous syneresis OU  - Discussed findings and prognosis  - No RT or RD on 360 exam  - Reviewed s/s of RT/RD  - Strict return precautions for any such RT/RD signs/symptoms  6. Pseudophakia OU  - s/p CE/IOL OU by Dr. Iona Hansen in 2016  - doing well  - monitor  7. PCO OU -- OD>OS  - S/P YAG cap OD (05.29.19)  - looks great with PC nicely open OD  - OS not yet visually significant  - monitor    Ophthalmic Meds Ordered this visit:  No orders of the defined types were placed in this encounter.      Return for f/u 2-3 months, exu non-exu ARMD OD, DFE, OCT.  There are no Patient Instructions on file for this visit.   Explained the diagnoses, plan, and follow up with the patient and they expressed understanding.  Patient expressed understanding of the importance of proper  follow up care.   This document serves as a record of services personally performed by Gardiner Sleeper, MD, PhD. It was created on their behalf by Ernest Mallick, OA, an ophthalmic assistant. The creation of this record is the provider's dictation and/or activities during the visit.    Electronically signed by: Ernest Mallick, OA 04.30.2021 11:45 PM   Gardiner Sleeper, M.D., Ph.D. Diseases & Surgery of the Retina and Vitreous [facility]  I have reviewed the above documentation for accuracy and completeness, and I agree with the above. Gardiner Sleeper, M.D., Ph.D. 05/23/19 11:45 PM   Abbreviations: M myopia (nearsighted); A astigmatism; H hyperopia (farsighted); P presbyopia; Mrx spectacle prescription;  CTL contact lenses; OD right eye; OS left eye; OU both eyes  XT  exotropia; ET esotropia; PEK punctate epithelial keratitis; PEE punctate epithelial erosions; DES dry eye syndrome; MGD meibomian gland dysfunction; ATs artificial tears; PFAT's preservative free artificial tears; [place] nuclear sclerotic cataract; PSC posterior subcapsular cataract; ERM epi-retinal membrane; PVD posterior vitreous detachment; RD retinal detachment; DM diabetes mellitus; DR diabetic retinopathy; NPDR non-proliferative diabetic retinopathy; PDR proliferative diabetic retinopathy; CSME clinically significant macular edema; DME diabetic macular edema; dbh dot blot hemorrhages; CWS cotton wool spot; POAG primary open angle glaucoma; C/D cup-to-disc ratio; HVF humphrey visual field; GVF goldmann visual field; OCT optical coherence tomography; IOP intraocular pressure; BRVO Branch retinal vein occlusion; CRVO central retinal vein occlusion; CRAO central retinal artery occlusion; BRAO branch retinal artery occlusion; RT retinal tear; SB scleral buckle; PPV pars plana vitrectomy; VH Vitreous hemorrhage; PRP panretinal laser photocoagulation; IVK intravitreal kenalog; VMT vitreomacular traction; MH Macular hole;  NVD neovascularization of the disc; NVE neovascularization elsewhere; AREDS age related eye disease study; ARMD age related macular degeneration; POAG primary open angle glaucoma; EBMD epithelial/anterior basement membrane dystrophy; ACIOL anterior chamber intraocular lens; IOL intraocular lens; PCIOL posterior chamber intraocular lens; Phaco/IOL phacoemulsification with intraocular lens placement; [place] photorefractive keratectomy; LASIK laser assisted in situ keratomileusis; HTN hypertension; DM diabetes mellitus; COPD chronic obstructive pulmonary disease

## 2019-05-21 ENCOUNTER — Ambulatory Visit (INDEPENDENT_AMBULATORY_CARE_PROVIDER_SITE_OTHER): Payer: Medicare Other | Admitting: Ophthalmology

## 2019-05-21 DIAGNOSIS — H353211 Exudative age-related macular degeneration, right eye, with active choroidal neovascularization: Secondary | ICD-10-CM | POA: Diagnosis not present

## 2019-05-21 DIAGNOSIS — H3581 Retinal edema: Secondary | ICD-10-CM | POA: Diagnosis not present

## 2019-05-21 DIAGNOSIS — H353122 Nonexudative age-related macular degeneration, left eye, intermediate dry stage: Secondary | ICD-10-CM | POA: Diagnosis not present

## 2019-05-21 DIAGNOSIS — H43813 Vitreous degeneration, bilateral: Secondary | ICD-10-CM

## 2019-05-21 DIAGNOSIS — H26493 Other secondary cataract, bilateral: Secondary | ICD-10-CM

## 2019-05-21 DIAGNOSIS — Z961 Presence of intraocular lens: Secondary | ICD-10-CM

## 2019-05-21 DIAGNOSIS — D3132 Benign neoplasm of left choroid: Secondary | ICD-10-CM

## 2019-05-23 ENCOUNTER — Encounter (INDEPENDENT_AMBULATORY_CARE_PROVIDER_SITE_OTHER): Payer: Self-pay | Admitting: Ophthalmology

## 2019-05-26 DIAGNOSIS — I10 Essential (primary) hypertension: Secondary | ICD-10-CM | POA: Diagnosis not present

## 2019-05-26 DIAGNOSIS — Z299 Encounter for prophylactic measures, unspecified: Secondary | ICD-10-CM | POA: Diagnosis not present

## 2019-05-26 DIAGNOSIS — Z0000 Encounter for general adult medical examination without abnormal findings: Secondary | ICD-10-CM | POA: Diagnosis not present

## 2019-05-26 DIAGNOSIS — Z1211 Encounter for screening for malignant neoplasm of colon: Secondary | ICD-10-CM | POA: Diagnosis not present

## 2019-05-26 DIAGNOSIS — Z7189 Other specified counseling: Secondary | ICD-10-CM | POA: Diagnosis not present

## 2019-05-26 DIAGNOSIS — E1165 Type 2 diabetes mellitus with hyperglycemia: Secondary | ICD-10-CM | POA: Diagnosis not present

## 2019-06-01 DIAGNOSIS — R21 Rash and other nonspecific skin eruption: Secondary | ICD-10-CM | POA: Diagnosis not present

## 2019-06-01 DIAGNOSIS — H353290 Exudative age-related macular degeneration, unspecified eye, stage unspecified: Secondary | ICD-10-CM | POA: Diagnosis not present

## 2019-06-01 DIAGNOSIS — E1151 Type 2 diabetes mellitus with diabetic peripheral angiopathy without gangrene: Secondary | ICD-10-CM | POA: Diagnosis not present

## 2019-06-01 DIAGNOSIS — Z299 Encounter for prophylactic measures, unspecified: Secondary | ICD-10-CM | POA: Diagnosis not present

## 2019-06-01 DIAGNOSIS — I10 Essential (primary) hypertension: Secondary | ICD-10-CM | POA: Diagnosis not present

## 2019-06-13 DIAGNOSIS — E119 Type 2 diabetes mellitus without complications: Secondary | ICD-10-CM | POA: Diagnosis not present

## 2019-06-13 DIAGNOSIS — I10 Essential (primary) hypertension: Secondary | ICD-10-CM | POA: Diagnosis not present

## 2019-06-18 DIAGNOSIS — E7800 Pure hypercholesterolemia, unspecified: Secondary | ICD-10-CM | POA: Diagnosis not present

## 2019-06-18 DIAGNOSIS — Z79899 Other long term (current) drug therapy: Secondary | ICD-10-CM | POA: Diagnosis not present

## 2019-06-18 DIAGNOSIS — R5383 Other fatigue: Secondary | ICD-10-CM | POA: Diagnosis not present

## 2019-07-14 DIAGNOSIS — E119 Type 2 diabetes mellitus without complications: Secondary | ICD-10-CM | POA: Diagnosis not present

## 2019-07-14 DIAGNOSIS — I10 Essential (primary) hypertension: Secondary | ICD-10-CM | POA: Diagnosis not present

## 2019-08-13 DIAGNOSIS — E119 Type 2 diabetes mellitus without complications: Secondary | ICD-10-CM | POA: Diagnosis not present

## 2019-08-13 DIAGNOSIS — I10 Essential (primary) hypertension: Secondary | ICD-10-CM | POA: Diagnosis not present

## 2019-08-23 ENCOUNTER — Encounter (INDEPENDENT_AMBULATORY_CARE_PROVIDER_SITE_OTHER): Payer: Medicare Other | Admitting: Ophthalmology

## 2019-09-01 DIAGNOSIS — Z789 Other specified health status: Secondary | ICD-10-CM | POA: Diagnosis not present

## 2019-09-01 DIAGNOSIS — Z299 Encounter for prophylactic measures, unspecified: Secondary | ICD-10-CM | POA: Diagnosis not present

## 2019-09-01 DIAGNOSIS — E1151 Type 2 diabetes mellitus with diabetic peripheral angiopathy without gangrene: Secondary | ICD-10-CM | POA: Diagnosis not present

## 2019-09-01 DIAGNOSIS — I10 Essential (primary) hypertension: Secondary | ICD-10-CM | POA: Diagnosis not present

## 2019-09-06 DIAGNOSIS — E119 Type 2 diabetes mellitus without complications: Secondary | ICD-10-CM | POA: Diagnosis not present

## 2019-09-07 DIAGNOSIS — E119 Type 2 diabetes mellitus without complications: Secondary | ICD-10-CM | POA: Diagnosis not present

## 2019-09-07 DIAGNOSIS — I10 Essential (primary) hypertension: Secondary | ICD-10-CM | POA: Diagnosis not present

## 2019-10-14 DIAGNOSIS — I10 Essential (primary) hypertension: Secondary | ICD-10-CM | POA: Diagnosis not present

## 2019-10-14 DIAGNOSIS — E119 Type 2 diabetes mellitus without complications: Secondary | ICD-10-CM | POA: Diagnosis not present

## 2019-11-12 DIAGNOSIS — I10 Essential (primary) hypertension: Secondary | ICD-10-CM | POA: Diagnosis not present

## 2019-11-12 DIAGNOSIS — E119 Type 2 diabetes mellitus without complications: Secondary | ICD-10-CM | POA: Diagnosis not present

## 2019-11-13 DIAGNOSIS — E119 Type 2 diabetes mellitus without complications: Secondary | ICD-10-CM | POA: Diagnosis not present

## 2019-11-26 DIAGNOSIS — E1165 Type 2 diabetes mellitus with hyperglycemia: Secondary | ICD-10-CM | POA: Diagnosis not present

## 2019-11-26 DIAGNOSIS — Z299 Encounter for prophylactic measures, unspecified: Secondary | ICD-10-CM | POA: Diagnosis not present

## 2019-11-26 DIAGNOSIS — R109 Unspecified abdominal pain: Secondary | ICD-10-CM | POA: Diagnosis not present

## 2019-11-26 DIAGNOSIS — I10 Essential (primary) hypertension: Secondary | ICD-10-CM | POA: Diagnosis not present

## 2019-12-02 NOTE — Progress Notes (Addendum)
[facility] Note  12/06/2019     CHIEF COMPLAINT Patient presents for Retina Follow Up   HISTORY OF PRESENT ILLNESS: George [name] is a 74 y.o. male who presents to the clinic today for:   HPI    Retina Follow Up    Patient presents with  Wet AMD.  In right eye.  This started 6 months ago.  I, the attending physician,  performed the HPI with the patient and updated documentation appropriately.          Comments    Patient here for 6 months retina follow up for exu ARMD OD. Patient states vision doing alright. No eye pain.        Last edited by Bernarda Caffey, MD on 12/06/2019  4:50 PM. (History)    Pt states no change in vision, he is still taking AREDS 2, he is not monitoring his vision with an amsler grid -- one given today  Referring physician: Pa, Dr Raymond Gurney [facility], [place] [place],  [place] [address]  HISTORICAL INFORMATION:   Selected notes from the MEDICAL RECORD NUMBER Referred by Dr. Rocky Link for retinal hemorrhage LEE: 05.10.19 (R. Turner) [BCVA: OD: 20/20 OS: 20/20] Ocular Hx-Pseudophakia PMH-DM(on Lantus and Metformin), HTN, Arthritis    CURRENT MEDICATIONS: No current outpatient medications on file. (Ophthalmic Drugs)   No current facility-administered medications for this visit. (Ophthalmic Drugs)   Current Outpatient Medications (Other)  Medication Sig  . amLODipine (NORVASC) 5 MG tablet Take 1 tablet by mouth daily.  Marland Kitchen atorvastatin (LIPITOR) 20 MG tablet Take 20 mg by mouth daily.  . cloNIDine (CATAPRES) 0.1 MG tablet Take 1 tablet by mouth daily.  . Insulin Glargine (LANTUS SOLOSTAR) 100 UNIT/ML Solostar Pen Inject 80 Units into the skin at bedtime.   Marland Kitchen losartan (COZAAR) 100 MG tablet Take 1 tablet by mouth daily.  . Metoprolol Succinate 100 MG CS24 Take by mouth.  Marland Kitchen NOVOLOG FLEXPEN 100 UNIT/ML FlexPen INJECT 25-31 UNITS INTO THE SKIN THREE TIMES DAILY BEFORE MEALS  . tamsulosin (FLOMAX) 0.4 MG  CAPS capsule Take 1 capsule (0.4 mg total) by mouth daily after supper.   Current Facility-Administered Medications (Other)  Medication Route  . Bevacizumab (AVASTIN) SOLN 1.25 mg Intravitreal  . Bevacizumab (AVASTIN) SOLN 1.25 mg Intravitreal  . Bevacizumab (AVASTIN) SOLN 1.25 mg Intravitreal  . Bevacizumab (AVASTIN) SOLN 1.25 mg Intravitreal  . Bevacizumab (AVASTIN) SOLN 1.25 mg Intravitreal      REVIEW OF SYSTEMS: ROS    Positive for: Endocrine, Eyes   Negative for: Constitutional, Gastrointestinal, Neurological, Skin, Genitourinary, Musculoskeletal, HENT, Cardiovascular, Respiratory, Psychiatric, Allergic/Imm, Heme/Lymph   Last edited by Theodore Demark, COA on 12/06/2019  1:45 PM. (History)       ALLERGIES No Known Allergies  PAST MEDICAL HISTORY Past Medical History:  Diagnosis Date  . Acute upper respiratory infections of unspecified site 2 weeks ago as of 11-18-17, resolved now  . Cancer [facility])    Prostate  . Chronic back pain    buldging disc and inflammatory arthritis  . Colitis, enteritis, and gastroenteritis of presumed infectious origin   . Confusion    occasionally short term  . Coronary artery disease   . Dermatophytosis of the body   . Diabetes mellitus without complication ([place])    takes Lantus   . Diabetes type 2, controlled ([place])    type 2  . Dizziness    occ with fast  head movement  . Eczema    right lower leg  . Generalized osteoarthrosis, unspecified site   . GERD (gastroesophageal reflux disease)   . Herpes zoster without mention of complication   . History of bronchitis many yrs ago  . History of claustrophobia   . HOH (hard of hearing)    both ears  . Joint pain   . Lumbago   . Other dyspnea and respiratory abnormality    with exertion  . Pleurisy without mention of effusion or current tuberculosis   . Prostate cancer ([place])   . Psoriasis and similar disorder   . Pure hypercholesterolemia    takes Atorvastatin daily  . Sinusitis     takes Allegra nightly and FLonase prn  . Unspecified essential hypertension    takes Amlodipine,Losartan,CLonidine,Lisinopril,and Metoprolol daily   Past Surgical History:  Procedure Laterality Date  . CATARACT EXTRACTION W/PHACO Left 04/04/2014   Procedure: CATARACT EXTRACTION PHACO AND INTRAOCULAR LENS PLACEMENT (IOC);  Surgeon: Williams Che, MD;  Location: [facility];  Service: Ophthalmology;  Laterality: Left;  CDE:4.49  . CATARACT EXTRACTION W/PHACO Right 01/02/2015   Procedure: CATARACT EXTRACTION PHACO AND INTRAOCULAR LENS PLACEMENT; CDE:  6.36;  Surgeon: Williams Che, MD;  Location: [facility];  Service: Ophthalmology;  Laterality: Right;  . CIRCUMCISION  2010  . CORONARY ARTERY BYPASS GRAFT N/A 08/13/2012   Procedure: CORONARY ARTERY BYPASS GRAFTING (CABG);  Surgeon: Ivin Poot, MD;  Location: [place];  Service: Open Heart Surgery;  Laterality: N/A;  . ENDOVEIN HARVEST OF GREATER SAPHENOUS VEIN Right 08/13/2012   Procedure: ENDOVEIN HARVEST OF GREATER SAPHENOUS VEIN;  Surgeon: Ivin Poot, MD;  Location: [place];  Service: Open Heart Surgery;  Laterality: Right;  . INTRAOPERATIVE TRANSESOPHAGEAL ECHOCARDIOGRAM N/A 08/13/2012   Procedure: INTRAOPERATIVE TRANSESOPHAGEAL ECHOCARDIOGRAM;  Surgeon: Ivin Poot, MD;  Location: [place];  Service: Open Heart Surgery;  Laterality: N/A;  . LEFT HEART CATHETERIZATION WITH CORONARY ANGIOGRAM N/A 08/07/2012   Procedure: LEFT HEART CATHETERIZATION WITH CORONARY ANGIOGRAM;  Surgeon: Peter M Martinique, MD;  Location: [facility] CATH LAB;  Service: Cardiovascular;  Laterality: N/A;  . RADIOACTIVE SEED IMPLANT N/A 11/25/2017   Procedure: RADIOACTIVE SEED IMPLANT/BRACHYTHERAPY IMPLANT;  Surgeon: Irine Seal, MD;  Location: [facility];  Service: Urology;  Laterality: N/A;  . SEED IMPLANT DONE AT [place]  01/2017    FAMILY HISTORY Family History  Problem Relation Age of Onset  . CAD Other   . Heart attack Other   . Diabetes Other   .  Cataracts Mother   . Amblyopia Neg Hx   . Blindness Neg Hx   . Glaucoma Neg Hx   . Macular degeneration Neg Hx   . Retinal detachment Neg Hx   . Strabismus Neg Hx   . Retinitis pigmentosa Neg Hx     SOCIAL HISTORY Social History   Tobacco Use  . Smoking status: Former Smoker    Packs/day: 1.00    Years: 4.00    Pack years: 4.00    Types: Cigarettes    Start date: 03/04/1965    Quit date: 03/05/1967    Years since quitting: 52.7  . Smokeless tobacco: Never Used  Vaping Use  . Vaping Use: Never used  Substance Use Topics  . Alcohol use: No    Alcohol/week: 0.0 standard drinks  . Drug use: No         OPHTHALMIC EXAM:  Base Eye Exam    Visual Acuity (Snellen - Linear)  Right Left   Dist [place] 20/30 +1 20/50   Dist ph  20/25 -2 20/30 -1       Tonometry (Tonopen, 1:42 PM)      Right Left   Pressure 17 15       Pupils      Dark Light Shape React APD   Right 3 2 Round Brisk None   Left 3 2 Round Brisk None       Visual Fields (Counting fingers)      Left Right    Full Full       Extraocular Movement      Right Left    Full Full       Neuro/Psych    Oriented x3: Yes   Mood/Affect: Normal       Dilation    Both eyes: 1.0% Mydriacyl, 2.5% Phenylephrine @ 1:42 PM        Slit Lamp and Fundus Exam    Slit Lamp Exam      Right Left   Lids/Lashes Dermatochalasis - upper lid Dermatochalasis - upper lid   Conjunctiva/Sclera Temporal and Nasal Pinguecula Temporal and Nasal Pinguecula   Cornea 1+ Inferior Punctate epithelial erosions, Arcus, superior cataract wounds with refractile deposits, trace Debris in tear film Arcus, superior cataract wounds well healed, trace Debris in tear film   Anterior Chamber Deep and quiet, no cell or flare Deep and quiet, no cell or flare   Iris Round and dilated, No NVI Round and dilated, No NVI   Lens PC IOL in good position, open PC PC IOL in good position, 1+ non-central PCO   Vitreous Vitreous syneresis, Posterior  vitreous detachment Vitreous syneresis, Posterior vitreous detachment       Fundus Exam      Right Left   Disc 360 Peripapillary atrophy, focal CNV with surrounding pigment clumping - nasal, no edema surrounding, no heme Sharp, 360 Peripapillary atrophy, mild Pallor   C/D Ratio 0.4 0.3   Macula Flat, Blunted foveal reflex, Drusen, RPE mottling and clumping, interval progression of Atrophy, no heme Flat, Blunted foveal reflex, RPE mottling and clumping, Drusen, No heme or edema, interval progression of perifoveal ring of Atrophy   Vessels Vascular attenuation, AV crossing changes, Tortuous Mild Vascular attenuation, mild AV crossing changes   Periphery Attached Attached, irregular pigmented lesion along ST arcade about 1.5DD area - flat, focal blot heme nasal to disc          IMAGING AND PROCEDURES  Imaging and Procedures for @TODAY @  OCT, Retina - OU - Both Eyes       Right Eye Quality was good. Central Foveal Thickness: 303. Progression has been stable. Findings include normal foveal contour, retinal drusen , outer retinal atrophy, pigment epithelial detachment, no IRF, no SRF (Persistent SRHM / PED nasal to disc - stable from prior; persistent patchy ORA; focal ellipsoid loss inferior to fovea - stable).   Left Eye Quality was good. Central Foveal Thickness: 300. Progression has worsened. Findings include normal foveal contour, no IRF, no SRF, retinal drusen , pigment epithelial detachment, outer retinal atrophy (Interval progression of ORA/GA parafovea).   Notes *Images captured and stored on drive  Diagnosis / Impression:  Exudative ARMD OD -- Persistent SRHM / PED nasal to disc - stable from prior; persistent patchy ORA; focal ellipsoid loss inferior to fovea - stable Nonexudative ARMD OS -- Interval progression of ORA/GA parafovea   Clinical management:  See below  Abbreviations: NFP - Normal foveal profile. CME -  cystoid macular edema. PED - pigment epithelial  detachment. IRF - intraretinal fluid. SRF - subretinal fluid. EZ - ellipsoid zone. ERM - epiretinal membrane. ORA - outer retinal atrophy. ORT - outer retinal tubulation. SRHM - subretinal hyper-reflective material                  ASSESSMENT/PLAN:    ICD-10-CM   1. Exudative age-related macular degeneration of right eye with active choroidal neovascularization ([place])  H35.3211   2. Retinal edema  H35.81 OCT, Retina - OU - Both Eyes  3. Intermediate stage nonexudative age-related macular degeneration of left eye  H35.3122   4. Nevus of choroid of left eye  D31.32   5. Posterior vitreous detachment of both eyes  H43.813   6. Pseudophakia of both eyes  Z96.1   7. PCO (posterior capsular opacification), bilateral  H26.493     1,2. Exudative age related macular degeneration, OD -- stable  - peripapillary CNVM with +subretinal heme and edema, nasal disc -- stably improved today with no active SRF or heme  - S/P IVA OD #1 (05.15.19), #2 (07.01.19), #3 (08.13.19), #4 (09.30.19), #5 (01.15.20)  - OCT shows OD -- Persistent SRHM / PED nasal to disc without overlying fluid - stable from prior; OS: Interval progression of ORA/GA parafovea  - BCVA 20/25 OD, stable  - discussed findings and the importance of regular f/u and AREDS 2 supplements  - recommend holding off on IVA OD today - pt in agreement   - cont AREDS 2 supplements and Amsler grid monitoring  - F/U 6 months, DFE, OCT  3. Age related macular degeneration, non-exudative, OS  - The incidence, anatomy, and pathology of dry AMD, risk of progression, and the AREDS and AREDS 2 study including smoking risks discussed with patient.  - OCT shows interval progression of ORA / GA  - Will continue to monitor  - recommend AREDS supplements and Amsler grid monitoring  4. Choroidal nevus OS-  - flat pigmented lesion along sup temp arcade  - no suspcious features -- low concern for choroidal melanoma  - monitor  5. PVD / vitreous  syneresis OU  - Discussed findings and prognosis  - No RT or RD on 360 exam  - Reviewed s/s of RT/RD  - Strict return precautions for any such RT/RD signs/symptoms  6. Pseudophakia OU  - s/p CE/IOL OU by Dr. Iona Hansen in 2016  - doing well  - monitor  7. PCO OU -- OD>OS  - S/P YAG cap OD (05.29.19)  - looks great with PC nicely open OD  - OS not yet visually significant  - monitor    Ophthalmic Meds Ordered this visit:  No orders of the defined types were placed in this encounter.      Return in about 6 months (around 06/04/2020) for f/u exu ARMD OD, DFE, OCT.  There are no Patient Instructions on file for this visit.  This document serves as a record of services personally performed by Gardiner Sleeper, MD, PhD. It was created on their behalf by Leeann Must, [place], an ophthalmic technician. The creation of this record is the provider's dictation and/or activities during the visit.    Electronically signed by: Leeann Must, [place] 11.18.2021 4:54 PM   This document serves as a record of services personally performed by Gardiner Sleeper, MD, PhD. It was created on their behalf by San Jetty. [place]ark, OA an ophthalmic technician. The creation of this record is the provider's dictation and/or  activities during the visit.    Electronically signed by: San Jetty. Marguerita Merles 11.22.2021 4:54 PM  Gardiner Sleeper, M.D., Ph.D. Diseases & Surgery of the Retina and [place] 12/06/2019   I have reviewed the above documentation for accuracy and completeness, and I agree with the above. Gardiner Sleeper, M.D., Ph.D. 12/06/19 4:54 PM   Abbreviations: M myopia (nearsighted); A astigmatism; H hyperopia (farsighted); P presbyopia; Mrx spectacle prescription;  CTL contact lenses; OD right eye; OS left eye; OU both eyes  XT exotropia; ET esotropia; PEK punctate epithelial keratitis; PEE punctate epithelial erosions; DES dry eye syndrome; MGD meibomian gland dysfunction;  ATs artificial tears; PFAT's preservative free artificial tears; [place] nuclear sclerotic cataract; PSC posterior subcapsular cataract; ERM epi-retinal membrane; PVD posterior vitreous detachment; RD retinal detachment; DM diabetes mellitus; DR diabetic retinopathy; NPDR non-proliferative diabetic retinopathy; PDR proliferative diabetic retinopathy; CSME clinically significant macular edema; DME diabetic macular edema; dbh dot blot hemorrhages; CWS cotton wool spot; POAG primary open angle glaucoma; C/D cup-to-disc ratio; HVF humphrey visual field; GVF goldmann visual field; OCT optical coherence tomography; IOP intraocular pressure; BRVO Branch retinal vein occlusion; CRVO central retinal vein occlusion; CRAO central retinal artery occlusion; BRAO branch retinal artery occlusion; RT retinal tear; SB scleral buckle; PPV pars plana vitrectomy; VH Vitreous hemorrhage; PRP panretinal laser photocoagulation; IVK intravitreal kenalog; VMT vitreomacular traction; MH Macular hole;  NVD neovascularization of the disc; NVE neovascularization elsewhere; AREDS age related eye disease study; ARMD age related macular degeneration; POAG primary open angle glaucoma; EBMD epithelial/anterior basement membrane dystrophy; ACIOL anterior chamber intraocular lens; IOL intraocular lens; PCIOL posterior chamber intraocular lens; Phaco/IOL phacoemulsification with intraocular lens placement; [place] photorefractive keratectomy; LASIK laser assisted in situ keratomileusis; HTN hypertension; DM diabetes mellitus; COPD chronic obstructive pulmonary disease

## 2019-12-06 ENCOUNTER — Ambulatory Visit (INDEPENDENT_AMBULATORY_CARE_PROVIDER_SITE_OTHER): Payer: Medicare Other | Admitting: Ophthalmology

## 2019-12-06 ENCOUNTER — Encounter (INDEPENDENT_AMBULATORY_CARE_PROVIDER_SITE_OTHER): Payer: Self-pay | Admitting: Ophthalmology

## 2019-12-06 ENCOUNTER — Other Ambulatory Visit: Payer: Self-pay

## 2019-12-06 DIAGNOSIS — H353122 Nonexudative age-related macular degeneration, left eye, intermediate dry stage: Secondary | ICD-10-CM | POA: Diagnosis not present

## 2019-12-06 DIAGNOSIS — Z961 Presence of intraocular lens: Secondary | ICD-10-CM

## 2019-12-06 DIAGNOSIS — H353211 Exudative age-related macular degeneration, right eye, with active choroidal neovascularization: Secondary | ICD-10-CM

## 2019-12-06 DIAGNOSIS — D3132 Benign neoplasm of left choroid: Secondary | ICD-10-CM

## 2019-12-06 DIAGNOSIS — H3581 Retinal edema: Secondary | ICD-10-CM | POA: Diagnosis not present

## 2019-12-06 DIAGNOSIS — H26493 Other secondary cataract, bilateral: Secondary | ICD-10-CM

## 2019-12-06 DIAGNOSIS — H43813 Vitreous degeneration, bilateral: Secondary | ICD-10-CM | POA: Diagnosis not present

## 2019-12-14 DIAGNOSIS — I10 Essential (primary) hypertension: Secondary | ICD-10-CM | POA: Diagnosis not present

## 2019-12-14 DIAGNOSIS — E119 Type 2 diabetes mellitus without complications: Secondary | ICD-10-CM | POA: Diagnosis not present

## 2019-12-30 DIAGNOSIS — Z299 Encounter for prophylactic measures, unspecified: Secondary | ICD-10-CM | POA: Diagnosis not present

## 2019-12-30 DIAGNOSIS — Z23 Encounter for immunization: Secondary | ICD-10-CM | POA: Diagnosis not present

## 2019-12-30 DIAGNOSIS — E1151 Type 2 diabetes mellitus with diabetic peripheral angiopathy without gangrene: Secondary | ICD-10-CM | POA: Diagnosis not present

## 2019-12-30 DIAGNOSIS — I10 Essential (primary) hypertension: Secondary | ICD-10-CM | POA: Diagnosis not present

## 2019-12-30 DIAGNOSIS — H353290 Exudative age-related macular degeneration, unspecified eye, stage unspecified: Secondary | ICD-10-CM | POA: Diagnosis not present

## 2019-12-30 DIAGNOSIS — E1165 Type 2 diabetes mellitus with hyperglycemia: Secondary | ICD-10-CM | POA: Diagnosis not present

## 2020-01-13 DIAGNOSIS — I10 Essential (primary) hypertension: Secondary | ICD-10-CM | POA: Diagnosis not present

## 2020-01-13 DIAGNOSIS — E119 Type 2 diabetes mellitus without complications: Secondary | ICD-10-CM | POA: Diagnosis not present

## 2020-02-07 DIAGNOSIS — R059 Cough, unspecified: Secondary | ICD-10-CM | POA: Diagnosis not present

## 2020-02-07 DIAGNOSIS — Z299 Encounter for prophylactic measures, unspecified: Secondary | ICD-10-CM | POA: Diagnosis not present

## 2020-02-07 DIAGNOSIS — U071 COVID-19: Secondary | ICD-10-CM | POA: Diagnosis not present

## 2020-02-07 DIAGNOSIS — E1151 Type 2 diabetes mellitus with diabetic peripheral angiopathy without gangrene: Secondary | ICD-10-CM | POA: Diagnosis not present

## 2020-02-07 DIAGNOSIS — I10 Essential (primary) hypertension: Secondary | ICD-10-CM | POA: Diagnosis not present

## 2020-02-14 DIAGNOSIS — E119 Type 2 diabetes mellitus without complications: Secondary | ICD-10-CM | POA: Diagnosis not present

## 2020-03-07 DIAGNOSIS — Z299 Encounter for prophylactic measures, unspecified: Secondary | ICD-10-CM | POA: Diagnosis not present

## 2020-03-07 DIAGNOSIS — I10 Essential (primary) hypertension: Secondary | ICD-10-CM | POA: Diagnosis not present

## 2020-03-07 DIAGNOSIS — Z789 Other specified health status: Secondary | ICD-10-CM | POA: Diagnosis not present

## 2020-03-07 DIAGNOSIS — M25562 Pain in left knee: Secondary | ICD-10-CM | POA: Diagnosis not present

## 2020-03-07 DIAGNOSIS — E1165 Type 2 diabetes mellitus with hyperglycemia: Secondary | ICD-10-CM | POA: Diagnosis not present

## 2020-03-07 DIAGNOSIS — E1151 Type 2 diabetes mellitus with diabetic peripheral angiopathy without gangrene: Secondary | ICD-10-CM | POA: Diagnosis not present

## 2020-03-13 DIAGNOSIS — E119 Type 2 diabetes mellitus without complications: Secondary | ICD-10-CM | POA: Diagnosis not present

## 2020-03-22 DIAGNOSIS — M1711 Unilateral primary osteoarthritis, right knee: Secondary | ICD-10-CM | POA: Diagnosis not present

## 2020-03-22 DIAGNOSIS — Z6830 Body mass index (BMI) 30.0-30.9, adult: Secondary | ICD-10-CM | POA: Diagnosis not present

## 2020-03-22 DIAGNOSIS — E1165 Type 2 diabetes mellitus with hyperglycemia: Secondary | ICD-10-CM | POA: Diagnosis not present

## 2020-03-22 DIAGNOSIS — E7800 Pure hypercholesterolemia, unspecified: Secondary | ICD-10-CM | POA: Diagnosis not present

## 2020-03-22 DIAGNOSIS — Z299 Encounter for prophylactic measures, unspecified: Secondary | ICD-10-CM | POA: Diagnosis not present

## 2020-03-22 DIAGNOSIS — I10 Essential (primary) hypertension: Secondary | ICD-10-CM | POA: Diagnosis not present

## 2020-04-03 DIAGNOSIS — Z299 Encounter for prophylactic measures, unspecified: Secondary | ICD-10-CM | POA: Diagnosis not present

## 2020-04-03 DIAGNOSIS — Z6830 Body mass index (BMI) 30.0-30.9, adult: Secondary | ICD-10-CM | POA: Diagnosis not present

## 2020-04-03 DIAGNOSIS — E1151 Type 2 diabetes mellitus with diabetic peripheral angiopathy without gangrene: Secondary | ICD-10-CM | POA: Diagnosis not present

## 2020-04-03 DIAGNOSIS — E1165 Type 2 diabetes mellitus with hyperglycemia: Secondary | ICD-10-CM | POA: Diagnosis not present

## 2020-04-03 DIAGNOSIS — E1129 Type 2 diabetes mellitus with other diabetic kidney complication: Secondary | ICD-10-CM | POA: Diagnosis not present

## 2020-04-03 DIAGNOSIS — I10 Essential (primary) hypertension: Secondary | ICD-10-CM | POA: Diagnosis not present

## 2020-04-13 DIAGNOSIS — E119 Type 2 diabetes mellitus without complications: Secondary | ICD-10-CM | POA: Diagnosis not present

## 2020-04-20 DIAGNOSIS — R0602 Shortness of breath: Secondary | ICD-10-CM | POA: Diagnosis not present

## 2020-04-20 DIAGNOSIS — Z87891 Personal history of nicotine dependence: Secondary | ICD-10-CM | POA: Diagnosis not present

## 2020-04-20 DIAGNOSIS — I10 Essential (primary) hypertension: Secondary | ICD-10-CM | POA: Diagnosis not present

## 2020-04-20 DIAGNOSIS — E1165 Type 2 diabetes mellitus with hyperglycemia: Secondary | ICD-10-CM | POA: Diagnosis not present

## 2020-04-20 DIAGNOSIS — Z299 Encounter for prophylactic measures, unspecified: Secondary | ICD-10-CM | POA: Diagnosis not present

## 2020-04-20 DIAGNOSIS — I2510 Atherosclerotic heart disease of native coronary artery without angina pectoris: Secondary | ICD-10-CM | POA: Diagnosis not present

## 2020-04-20 DIAGNOSIS — R030 Elevated blood-pressure reading, without diagnosis of hypertension: Secondary | ICD-10-CM | POA: Diagnosis not present

## 2020-04-20 DIAGNOSIS — Z6831 Body mass index (BMI) 31.0-31.9, adult: Secondary | ICD-10-CM | POA: Diagnosis not present

## 2020-04-24 DIAGNOSIS — R0602 Shortness of breath: Secondary | ICD-10-CM | POA: Diagnosis not present

## 2020-05-12 DIAGNOSIS — E119 Type 2 diabetes mellitus without complications: Secondary | ICD-10-CM | POA: Diagnosis not present

## 2020-05-30 DIAGNOSIS — Z6830 Body mass index (BMI) 30.0-30.9, adult: Secondary | ICD-10-CM | POA: Diagnosis not present

## 2020-05-30 DIAGNOSIS — Z299 Encounter for prophylactic measures, unspecified: Secondary | ICD-10-CM | POA: Diagnosis not present

## 2020-05-30 DIAGNOSIS — Z0000 Encounter for general adult medical examination without abnormal findings: Secondary | ICD-10-CM | POA: Diagnosis not present

## 2020-05-30 DIAGNOSIS — Z79899 Other long term (current) drug therapy: Secondary | ICD-10-CM | POA: Diagnosis not present

## 2020-05-30 DIAGNOSIS — R5383 Other fatigue: Secondary | ICD-10-CM | POA: Diagnosis not present

## 2020-05-30 DIAGNOSIS — Z1339 Encounter for screening examination for other mental health and behavioral disorders: Secondary | ICD-10-CM | POA: Diagnosis not present

## 2020-05-30 DIAGNOSIS — Z1331 Encounter for screening for depression: Secondary | ICD-10-CM | POA: Diagnosis not present

## 2020-05-30 DIAGNOSIS — R52 Pain, unspecified: Secondary | ICD-10-CM | POA: Diagnosis not present

## 2020-05-30 DIAGNOSIS — Z7189 Other specified counseling: Secondary | ICD-10-CM | POA: Diagnosis not present

## 2020-05-30 DIAGNOSIS — R030 Elevated blood-pressure reading, without diagnosis of hypertension: Secondary | ICD-10-CM | POA: Diagnosis not present

## 2020-05-30 DIAGNOSIS — Z125 Encounter for screening for malignant neoplasm of prostate: Secondary | ICD-10-CM | POA: Diagnosis not present

## 2020-05-30 DIAGNOSIS — E7800 Pure hypercholesterolemia, unspecified: Secondary | ICD-10-CM | POA: Diagnosis not present

## 2020-05-31 NOTE — Progress Notes (Shared)
[facility] Note  06/05/2020     CHIEF COMPLAINT Patient presents for No chief complaint on file.   HISTORY OF PRESENT ILLNESS: George [name] is a 75 y.o. male who presents to the clinic today for:   Pt states no change in vision, he is still taking AREDS 2, he is not monitoring his vision with an amsler grid -- one given today  Referring physician: Monico Blitz, MD [place],  [place] [address]  HISTORICAL INFORMATION:   Selected notes from the MEDICAL RECORD NUMBER Referred by Dr. Rocky Link for retinal hemorrhage LEE: 05.10.19 (R. Turner) [BCVA: OD: 20/20 OS: 20/20] Ocular Hx-Pseudophakia PMH-DM(on Lantus and Metformin), HTN, Arthritis    CURRENT MEDICATIONS: No current outpatient medications on file. (Ophthalmic Drugs)   No current facility-administered medications for this visit. (Ophthalmic Drugs)   Current Outpatient Medications (Other)  Medication Sig  . amLODipine (NORVASC) 5 MG tablet Take 1 tablet by mouth daily.  Marland Kitchen atorvastatin (LIPITOR) 20 MG tablet Take 20 mg by mouth daily.  . cloNIDine (CATAPRES) 0.1 MG tablet Take 1 tablet by mouth daily.  . Insulin Glargine (LANTUS SOLOSTAR) 100 UNIT/ML Solostar Pen Inject 80 Units into the skin at bedtime.   Marland Kitchen losartan (COZAAR) 100 MG tablet Take 1 tablet by mouth daily.  . Metoprolol Succinate 100 MG CS24 Take by mouth.  Marland Kitchen NOVOLOG FLEXPEN 100 UNIT/ML FlexPen INJECT 25-31 UNITS INTO THE SKIN THREE TIMES DAILY BEFORE MEALS  . tamsulosin (FLOMAX) 0.4 MG CAPS capsule Take 1 capsule (0.4 mg total) by mouth daily after supper.   Current Facility-Administered Medications (Other)  Medication Route  . Bevacizumab (AVASTIN) SOLN 1.25 mg Intravitreal  . Bevacizumab (AVASTIN) SOLN 1.25 mg Intravitreal  . Bevacizumab (AVASTIN) SOLN 1.25 mg Intravitreal  . Bevacizumab (AVASTIN) SOLN 1.25 mg Intravitreal  . Bevacizumab (AVASTIN) SOLN 1.25 mg Intravitreal      REVIEW OF  SYSTEMS:    ALLERGIES No Known Allergies  PAST MEDICAL HISTORY Past Medical History:  Diagnosis Date  . Acute upper respiratory infections of unspecified site 2 weeks ago as of 11-18-17, resolved now  . Cancer [facility])    Prostate  . Chronic back pain    buldging disc and inflammatory arthritis  . Colitis, enteritis, and gastroenteritis of presumed infectious origin   . Confusion    occasionally short term  . Coronary artery disease   . Dermatophytosis of the body   . Diabetes mellitus without complication ([place])    takes Lantus   . Diabetes type 2, controlled ([place])    type 2  . Dizziness    occ with fast head movement  . Eczema    right lower leg  . Generalized osteoarthrosis, unspecified site   . GERD (gastroesophageal reflux disease)   . Herpes zoster without mention of complication   . History of bronchitis many yrs ago  . History of claustrophobia   . HOH (hard of hearing)    both ears  . Joint pain   . Lumbago   . Other dyspnea and respiratory abnormality    with exertion  . Pleurisy without mention of effusion or current tuberculosis   . Prostate cancer ([place])   . Psoriasis and similar disorder   . Pure hypercholesterolemia    takes Atorvastatin daily  . Sinusitis    takes Allegra nightly and FLonase prn  . Unspecified essential hypertension    takes Amlodipine,Losartan,CLonidine,Lisinopril,and Metoprolol daily   Past Surgical History:  Procedure  Laterality Date  . CATARACT EXTRACTION W/PHACO Left 04/04/2014   Procedure: CATARACT EXTRACTION PHACO AND INTRAOCULAR LENS PLACEMENT (IOC);  Surgeon: Williams Che, MD;  [facility]: [facility];  Service: Ophthalmology;  Laterality: Left;  CDE:4.49  . CATARACT EXTRACTION W/PHACO Right 01/02/2015   Procedure: CATARACT EXTRACTION PHACO AND INTRAOCULAR LENS PLACEMENT; CDE:  6.36;  Surgeon: Williams Che, MD;  Location: [facility];  Service: Ophthalmology;  Laterality: Right;  . CIRCUMCISION  2010  . CORONARY ARTERY BYPASS  GRAFT N/A 08/13/2012   Procedure: CORONARY ARTERY BYPASS GRAFTING (CABG);  Surgeon: Ivin Poot, MD;  Location: [place];  Service: Open Heart Surgery;  Laterality: N/A;  . ENDOVEIN HARVEST OF GREATER SAPHENOUS VEIN Right 08/13/2012   Procedure: ENDOVEIN HARVEST OF GREATER SAPHENOUS VEIN;  Surgeon: Ivin Poot, MD;  Location: [place];  Service: Open Heart Surgery;  Laterality: Right;  . INTRAOPERATIVE TRANSESOPHAGEAL ECHOCARDIOGRAM N/A 08/13/2012   Procedure: INTRAOPERATIVE TRANSESOPHAGEAL ECHOCARDIOGRAM;  Surgeon: Ivin Poot, MD;  Location: [place];  Service: Open Heart Surgery;  Laterality: N/A;  . LEFT HEART CATHETERIZATION WITH CORONARY ANGIOGRAM N/A 08/07/2012   Procedure: LEFT HEART CATHETERIZATION WITH CORONARY ANGIOGRAM;  Surgeon: Peter M Martinique, MD;  Location[facility] LAB;  Service: Cardiovascular;  Laterality: N/A;  . RADIOACTIVE SEED IMPLANT N/A 11/25/2017   Procedure: RADIOACTIVE SEED IMPLANT/BRACHYTHERAPY IMPLANT;  Surgeon: Irine Seal, MD;  Location: [facility];  Service: Urology;  Laterality: N/A;  . SEED IMPLANT DONE AT [place]  01/2017    FAMILY HISTORY Family History  Problem Relation Age of Onset  . CAD Other   . Heart attack Other   . Diabetes Other   . Cataracts Mother   . Amblyopia Neg Hx   . Blindness Neg Hx   . Glaucoma Neg Hx   . Macular degeneration Neg Hx   . Retinal detachment Neg Hx   . Strabismus Neg Hx   . Retinitis pigmentosa Neg Hx     SOCIAL HISTORY Social History   Tobacco Use  . Smoking status: Former Smoker    Packs/day: 1.00    Years: 4.00    Pack years: 4.00    Types: Cigarettes    Start date: 03/04/1965    Quit date: 03/05/1967    Years since quitting: 53.2  . Smokeless tobacco: Never Used  Vaping Use  . Vaping Use: Never used  Substance Use Topics  . Alcohol use: No    Alcohol/week: 0.0 standard drinks  . Drug use: No         OPHTHALMIC EXAM:  Not recorded     IMAGING AND PROCEDURES  Imaging and  Procedures for @TODAY @           ASSESSMENT/PLAN:    ICD-10-CM   1. Exudative age-related macular degeneration of right eye with active choroidal neovascularization ([place])  H35.3211   2. Retinal edema  H35.81   3. Intermediate stage nonexudative age-related macular degeneration of left eye  H35.3122   4. Nevus of choroid of left eye  D31.32   5. Posterior vitreous detachment of both eyes  H43.813   6. Pseudophakia of both eyes  Z96.1   7. PCO (posterior capsular opacification), bilateral  H26.493     1,2. Exudative age related macular degeneration, OD -- stable  - peripapillary CNVM with +subretinal heme and edema, nasal disc -- stably improved today with no active SRF or heme  - S/P IVA OD #1 (05.15.19), #2 (07.01.19), #3 (08.13.19), #4 (09.30.19), #5 (  01.15.20)  - OCT shows OD -- Persistent SRHM / PED nasal to disc without overlying fluid - stable from prior; OS: Interval progression of ORA/GA parafovea  - BCVA 20/25 OD, stable  - discussed findings and the importance of regular f/u and AREDS 2 supplements  - recommend holding off on IVA OD today - pt in agreement   - cont AREDS 2 supplements and Amsler grid monitoring  - F/U 6 months, DFE, OCT  3. Age related macular degeneration, non-exudative, OS  - The incidence, anatomy, and pathology of dry AMD, risk of progression, and the AREDS and AREDS 2 study including smoking risks discussed with patient.  - OCT shows interval progression of ORA / GA  - Will continue to monitor  - recommend AREDS supplements and Amsler grid monitoring  4. Choroidal nevus OS-  - flat pigmented lesion along sup temp arcade  - no suspcious features -- low concern for choroidal melanoma  - monitor  5. PVD / vitreous syneresis OU  - Discussed findings and prognosis  - No RT or RD on 360 exam  - Reviewed s/s of RT/RD  - Strict return precautions for any such RT/RD signs/symptoms  6. Pseudophakia OU  - s/p CE/IOL OU by Dr. Iona Hansen in 2016  -  doing well  - monitor  7. PCO OU -- OD>OS  - S/P YAG cap OD (05.29.19)  - looks great with PC nicely open OD  - OS not yet visually significant  - monitor    Ophthalmic Meds Ordered this visit:  No orders of the defined types were placed in this encounter.      No follow-ups on file.  There are no Patient Instructions on file for this visit.  This document serves as a record of services personally performed by Gardiner Sleeper, MD, PhD. It was created on their behalf by Leeann Must, [place], an ophthalmic technician. The creation of this record is the provider's dictation and/or activities during the visit.    Electronically signed by: Leeann Must, COA @TODAY @ 9:48 AM   Abbreviations: M myopia (nearsighted); A astigmatism; H hyperopia (farsighted); P presbyopia; Mrx spectacle prescription;  CTL contact lenses; OD right eye; OS left eye; OU both eyes  XT exotropia; ET esotropia; PEK punctate epithelial keratitis; PEE punctate epithelial erosions; DES dry eye syndrome; MGD meibomian gland dysfunction; ATs artificial tears; PFAT's preservative free artificial tears; [place] nuclear sclerotic cataract; PSC posterior subcapsular cataract; ERM epi-retinal membrane; PVD posterior vitreous detachment; RD retinal detachment; DM diabetes mellitus; DR diabetic retinopathy; NPDR non-proliferative diabetic retinopathy; PDR proliferative diabetic retinopathy; CSME clinically significant macular edema; DME diabetic macular edema; dbh dot blot hemorrhages; CWS cotton wool spot; POAG primary open angle glaucoma; C/D cup-to-disc ratio; HVF humphrey visual field; GVF goldmann visual field; OCT optical coherence tomography; IOP intraocular pressure; BRVO Branch retinal vein occlusion; CRVO central retinal vein occlusion; CRAO central retinal artery occlusion; BRAO branch retinal artery occlusion; RT retinal tear; SB scleral buckle; PPV pars plana vitrectomy; VH Vitreous hemorrhage; PRP panretinal laser  photocoagulation; IVK intravitreal kenalog; VMT vitreomacular traction; MH Macular hole;  NVD neovascularization of the disc; NVE neovascularization elsewhere; AREDS age related eye disease study; ARMD age related macular degeneration; POAG primary open angle glaucoma; EBMD epithelial/anterior basement membrane dystrophy; ACIOL anterior chamber intraocular lens; IOL intraocular lens; PCIOL posterior chamber intraocular lens; Phaco/IOL phacoemulsification with intraocular lens placement; [place] photorefractive keratectomy; LASIK laser assisted in situ keratomileusis; HTN hypertension; DM diabetes mellitus; COPD chronic obstructive pulmonary disease

## 2020-06-05 ENCOUNTER — Encounter (INDEPENDENT_AMBULATORY_CARE_PROVIDER_SITE_OTHER): Payer: Medicare HMO | Admitting: Ophthalmology

## 2020-06-05 DIAGNOSIS — H353211 Exudative age-related macular degeneration, right eye, with active choroidal neovascularization: Secondary | ICD-10-CM

## 2020-06-05 DIAGNOSIS — D3132 Benign neoplasm of left choroid: Secondary | ICD-10-CM

## 2020-06-05 DIAGNOSIS — H3581 Retinal edema: Secondary | ICD-10-CM

## 2020-06-05 DIAGNOSIS — H353122 Nonexudative age-related macular degeneration, left eye, intermediate dry stage: Secondary | ICD-10-CM

## 2020-06-05 DIAGNOSIS — H26493 Other secondary cataract, bilateral: Secondary | ICD-10-CM

## 2020-06-05 DIAGNOSIS — Z961 Presence of intraocular lens: Secondary | ICD-10-CM

## 2020-06-05 DIAGNOSIS — H43813 Vitreous degeneration, bilateral: Secondary | ICD-10-CM

## 2020-06-13 DIAGNOSIS — E119 Type 2 diabetes mellitus without complications: Secondary | ICD-10-CM | POA: Diagnosis not present

## 2020-06-16 ENCOUNTER — Encounter: Payer: Self-pay | Admitting: *Deleted

## 2020-06-19 ENCOUNTER — Ambulatory Visit: Payer: Medicare HMO | Admitting: Cardiology

## 2020-06-19 ENCOUNTER — Encounter: Payer: Self-pay | Admitting: *Deleted

## 2020-06-19 ENCOUNTER — Encounter: Payer: Self-pay | Admitting: Cardiology

## 2020-06-19 VITALS — BP 160/70 | HR 94 | Ht 76.0 in | Wt 240.0 lb

## 2020-06-19 DIAGNOSIS — R0602 Shortness of breath: Secondary | ICD-10-CM | POA: Diagnosis not present

## 2020-06-19 DIAGNOSIS — I25119 Atherosclerotic heart disease of native coronary artery with unspecified angina pectoris: Secondary | ICD-10-CM

## 2020-06-19 DIAGNOSIS — E782 Mixed hyperlipidemia: Secondary | ICD-10-CM | POA: Diagnosis not present

## 2020-06-19 DIAGNOSIS — I10 Essential (primary) hypertension: Secondary | ICD-10-CM

## 2020-06-19 DIAGNOSIS — I712 Thoracic aortic aneurysm, without rupture: Secondary | ICD-10-CM | POA: Diagnosis not present

## 2020-06-19 DIAGNOSIS — I7121 Aneurysm of the ascending aorta, without rupture: Secondary | ICD-10-CM

## 2020-06-19 NOTE — Patient Instructions (Signed)
Medication Instructions:  Your physician recommends that you continue on your current medications as directed. Please refer to the Current Medication list given to you today.  Labwork: none  Testing/Procedures: Your physician has requested that you have an echocardiogram. Echocardiography is a painless test that uses sound waves to create images of your heart. It provides your doctor with information about the size and shape of your heart and how well your heart's chambers and valves are working. This procedure takes approximately one hour. There are no restrictions for this procedure. Your physician has requested that you have a lexiscan myoview. For further information please visit [email or web address]. Please follow instruction sheet, as given.  Follow-Up: Your physician recommends that you schedule a follow-up appointment in: pending  Any Other Special Instructions Will Be Listed Below (If Applicable).  If you need a refill on your cardiac medications before your next appointment, please call your pharmacy. 

## 2020-06-19 NOTE — Progress Notes (Signed)
Cardiology Office Note  Date: 06/19/2020   ID: Martha, Soltys 21-Mar-1945, MRN [id number]  PCP:  Monico Blitz, MD  Cardiologist:  Rozann Lesches, MD Electrophysiologist:  None   Chief Complaint  Patient presents with  . Cardiac follow-up    History of Present Illness: George [name] is a 75 y.o. male referred for cardiology consultation by Dr. Manuella Ghazi to reestablish follow-up of CAD.  He is a former patient of Dr. Bronson Ing last seen in 2018.  I reviewed his records and updated the chart.  He states that he has been more short of breath and fatigue with activity over the last several months.  No specific angina symptoms.  Also no sense of palpitations or dizziness.  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  Plans to do this as long as he can.  I reviewed his medications which are noted below, he reports compliance.  Today's ECG shows sinus rhythm with frequent PACs, heart rate regular by auscultation after tracing.  Past Medical History:  Diagnosis Date  . Ascending aortic aneurysm (HCC)    4.1 cm in 2019  . Chronic back pain   . Coronary artery disease    Status post CABG 2014 (LIMA to LAD, SVG to ramus, SVG to OM 2)  . Dermatophytosis of the body   . Eczema   . Essential hypertension   . GERD (gastroesophageal reflux disease)   . Herpes zoster without mention of complication   . History of bronchitis   . History of claustrophobia   . HOH (hard of hearing)   . Mixed hyperlipidemia   . Prostate cancer ([place])   . Psoriasis and similar disorder   . Sinusitis   . Type 2 diabetes mellitus ([place])     Past Surgical History:  Procedure Laterality Date  . CATARACT EXTRACTION W/PHACO Left 04/04/2014   Procedure: CATARACT EXTRACTION PHACO AND INTRAOCULAR LENS PLACEMENT (IOC);  Surgeon: Williams Che, MD;  [facility];  Service: Ophthalmology;  Laterality: Left;  CDE:4.49  . CATARACT EXTRACTION W/PHACO Right 01/02/2015   Procedure: CATARACT EXTRACTION PHACO  AND INTRAOCULAR LENS PLACEMENT; CDE:  6.36;  Surgeon: Williams Che, MD;  Location: [facility];  Service: Ophthalmology;  Laterality: Right;  . CIRCUMCISION  2010  . CORONARY ARTERY BYPASS GRAFT N/A 08/13/2012   Procedure: CORONARY ARTERY BYPASS GRAFTING (CABG);  Surgeon: Ivin Poot, MD;  Location: [place];  Service: Open Heart Surgery;  Laterality: N/A;  . ENDOVEIN HARVEST OF GREATER SAPHENOUS VEIN Right 08/13/2012   Procedure: ENDOVEIN HARVEST OF GREATER SAPHENOUS VEIN;  Surgeon: Ivin Poot, MD;  Location: [place];  Service: Open Heart Surgery;  Laterality: Right;  . INTRAOPERATIVE TRANSESOPHAGEAL ECHOCARDIOGRAM N/A 08/13/2012   Procedure: INTRAOPERATIVE TRANSESOPHAGEAL ECHOCARDIOGRAM;  Surgeon: Ivin Poot, MD;  Location: [place];  Service: Open Heart Surgery;  Laterality: N/A;  . LEFT HEART CATHETERIZATION WITH CORONARY ANGIOGRAM N/A 08/07/2012   Procedure: LEFT HEART CATHETERIZATION WITH CORONARY ANGIOGRAM;  Surgeon: Peter M Martinique, MD;  Location[facility] C[facility] LAB;  Service: Cardiovascular;  Laterality: N/A;  . RADIOACTIVE SEED IMPLANT N/A 11/25/2017   Procedure: RADIOACTIVE SEED IMPLANT/BRACHYTHERAPY IMPLANT;  Surgeon: Irine Seal, MD;  Location: [facility];  Service: Urology;  Laterality: N/A;  . SEED IMPLANT DONE AT [place]  01/2017    Current Outpatient Medications  Medication Sig Dispense Refill  . amLODipine (NORVASC) 5 MG tablet Take 1 tablet by mouth daily.    Marland Kitchen  atorvastatin (LIPITOR) 20 MG tablet Take 20 mg by mouth daily.    . cloNIDine (CATAPRES) 0.1 MG tablet Take 1 tablet by mouth daily.    . insulin glargine (LANTUS) 100 UNIT/ML Solostar Pen Inject 80 Units into the skin at bedtime.     Marland Kitchen losartan (COZAAR) 100 MG tablet Take 1 tablet by mouth daily.    . Metoprolol Succinate 100 MG CS24 Take by mouth.    Marland Kitchen NOVOLOG FLEXPEN 100 UNIT/ML FlexPen INJECT 25-31 UNITS INTO THE SKIN THREE TIMES DAILY BEFORE MEALS 30 mL 0   Current Facility-Administered Medications   Medication Dose Route Frequency Provider Last Rate Last Admin  . Bevacizumab (AVASTIN) SOLN 1.25 mg  1.25 mg Intravitreal  Bernarda Caffey, MD   1.25 mg at 05/28/17 1541  . Bevacizumab (AVASTIN) SOLN 1.25 mg  1.25 mg Intravitreal  Bernarda Caffey, MD   1.25 mg at 07/14/17 2241  . Bevacizumab (AVASTIN) SOLN 1.25 mg  1.25 mg Intravitreal  Bernarda Caffey, MD   1.25 mg at 08/26/17 1530  . Bevacizumab (AVASTIN) SOLN 1.25 mg  1.25 mg Intravitreal  Bernarda Caffey, MD   1.25 mg at 10/13/17 1530  . Bevacizumab (AVASTIN) SOLN 1.25 mg  1.25 mg Intravitreal  Bernarda Caffey, MD   1.25 mg at 01/29/18 0845   Allergies:  Patient has no known allergies.   Social History: The patient  reports that he quit smoking about 53 years ago. His smoking use included cigarettes. He started smoking about 55 years ago. He has a 4.00 pack-year smoking history. He has never used smokeless tobacco. He reports that he does not drink alcohol and does not use drugs.   Family History: The patient's family history includes CAD in an other family member; Cataracts in his mother; Diabetes in an other family member; Heart attack in an other family member.   ROS: No palpitations or syncope.  Physical Exam: VS:  BP (!) 160/70   Pulse 94   Ht 6\' 4"  (1.93 m)   Wt 240 lb (108.9 kg)   SpO2 97%   BMI 29.21 kg/m , BMI Body mass index is 29.21 kg/m.  Wt Readings from Last 3 Encounters:  06/19/20 240 lb (108.9 kg)  12/10/17 225 lb (102.1 kg)  11/25/17 219 lb 8 oz (99.6 kg)    General: Elderly male, appears comfortable at rest. HEENT: Conjunctiva and lids normal, wearing a mask. Neck: Supple, no elevated JVP or carotid bruits, no thyromegaly. Lungs: Clear to auscultation, nonlabored breathing at rest. Cardiac: Regular rate and rhythm, no S3, 1/6 systolic murmur, no pericardial rub. Abdomen: Soft, nontender, bowel sounds present. Extremities: No pitting edema, distal pulses 2+. Skin: Warm and dry. Musculoskeletal: No  kyphosis. Neuropsychiatric: Alert and oriented x3, affect grossly appropriate.  ECG:  An ECG dated 08/11/2018 was personally reviewed today and demonstrated:  Sinus tachycardia with prolonged PR interval, left anterior fascicular block, IVCD rule out old anterior infarct pattern.  Recent Labwork:    Component Value Date/Time   CHOL 103 04/23/2017 0000   TRIG 123 04/23/2017 0000   HDL 34 (A) 04/23/2017 0000   LDLCALC 44 04/23/2017 0000  No recent lab work for review today.  Other Studies Reviewed Today:  Echocardiogram 10/29/2013: - Procedure narrative: Transthoracic echocardiography. Image  quality was suboptimal. The study was technically difficult, as a  result of poor sound wave transmission.  - Left ventricle: The cavity size was normal. Wall thickness was  increased in a pattern of moderate LVH. Systolic function was  normal. The estimated ejection fraction was in the range of 55%  to 60%. Regional wall motion was difficult to assess due to  suboptimal endocardial visualization, but no gross abnormalities  were seen. Diastolic dysfunction, grade indeterminate. Mildly  elevated left atrial pressures, medial E/e&' 11.8.  - Aortic valve: There was mild eccentric regurgitation.  - Aorta: Mildly dilated aortic root. Aortic root dimension: 44 mm  (ED).  - Left atrium: The atrium was mildly dilated.    GXT 12/25/2015:  Blood pressure demonstrated a hypertensive response to exercise.  There was no ST segment deviation noted during stress.  Negative exercise stress test for ischemia  [facility] treadmill score of 7 consistent with low risk for major cardiac events.  Assessment and Plan:  1.  Shortness of breath and relative fatigue with activity.  Patient has a known history of multivessel CAD status post CABG in 2014, LVEF normal in the range of 55 to 60% as of 2015 evaluation.  ECG reviewed.  Plan is to obtain a follow-up echocardiogram as well as [place]  for cardiac structural and ischemic evaluation.  2.  History of asymptomatic ascending thoracic aortic aneurysm, 4.1 cm by CT imaging in 2019.  This will require further follow-up as well.  Will address problem above cardiac testing first.  3.  Sinus rhythm with frequent PACs, no history of atrial fibrillation.  No palpitations reported.  Continue observation for now.  4.  Essential hypertension, on Cozaar and Norvasc.  He has follow-up with Dr. Manuella Ghazi.  May need further medication up titration.  5.  Mixed hyperlipidemia, on Lipitor.  Followed by Dr. Manuella Ghazi, last LDL 44.  Medication Adjustments/Labs and Tests Ordered: Current medicines are reviewed at length with the patient today.  Concerns regarding medicines are outlined above.   Tests Ordered: Orders Placed This Encounter  Procedures  . NM Myocar Multi W/Spect W/Wall Motion / EF  . EKG 12-Lead  . ECHOCARDIOGRAM COMPLETE    Medication Changes: No orders of the defined types were placed in this encounter.   Disposition:  Follow up test results.  Signed, Satira Sark, MD, [facility] 06/19/2020 2:01 PM    [facility] [place], [place], [place] [address] Phone: [phone]; Fax: [phone]

## 2020-06-30 ENCOUNTER — Encounter (INDEPENDENT_AMBULATORY_CARE_PROVIDER_SITE_OTHER): Payer: Self-pay | Admitting: Ophthalmology

## 2020-06-30 ENCOUNTER — Ambulatory Visit (INDEPENDENT_AMBULATORY_CARE_PROVIDER_SITE_OTHER): Payer: Medicare HMO | Admitting: Ophthalmology

## 2020-06-30 ENCOUNTER — Other Ambulatory Visit: Payer: Self-pay

## 2020-06-30 ENCOUNTER — Encounter (INDEPENDENT_AMBULATORY_CARE_PROVIDER_SITE_OTHER): Payer: Medicare HMO | Admitting: Ophthalmology

## 2020-06-30 DIAGNOSIS — Z961 Presence of intraocular lens: Secondary | ICD-10-CM

## 2020-06-30 DIAGNOSIS — D3132 Benign neoplasm of left choroid: Secondary | ICD-10-CM

## 2020-06-30 DIAGNOSIS — H353122 Nonexudative age-related macular degeneration, left eye, intermediate dry stage: Secondary | ICD-10-CM | POA: Diagnosis not present

## 2020-06-30 DIAGNOSIS — H26493 Other secondary cataract, bilateral: Secondary | ICD-10-CM

## 2020-06-30 DIAGNOSIS — H353211 Exudative age-related macular degeneration, right eye, with active choroidal neovascularization: Secondary | ICD-10-CM

## 2020-06-30 DIAGNOSIS — H43813 Vitreous degeneration, bilateral: Secondary | ICD-10-CM | POA: Diagnosis not present

## 2020-06-30 DIAGNOSIS — H3581 Retinal edema: Secondary | ICD-10-CM | POA: Diagnosis not present

## 2020-06-30 NOTE — Progress Notes (Signed)
[facility] Note  06/30/2020     CHIEF COMPLAINT Patient presents for Retina Follow Up   HISTORY OF PRESENT ILLNESS: George [name] is a 75 y.o. male who presents to the clinic today for:   HPI     Retina Follow Up   Patient presents with  Wet AMD.  In right eye.  Severity is moderate.  Duration of 7 months.  Since onset it is stable.  I, the attending physician,  performed the HPI with the patient and updated documentation appropriately.        Comments   Patient states vision seems more fuzzy OS for the past month. Vision seems about the same OD. BS was 200 this am. Last a1c was 7.2, checked 3-4 weeks ago.       Last edited by Bernarda Caffey, MD on 07/02/2020 10:56 PM.    Pt states vision is the same, he is taking AREDS BID  Referring physician: Monico Blitz, MD [place],  [place] [id number]  HISTORICAL INFORMATION:   Selected notes from the MEDICAL RECORD NUMBER Referred by Dr. Rocky Link for retinal hemorrhage LEE: 05.10.19 (R. Turner) [BCVA: OD: 20/20 OS: 20/20] Ocular Hx-Pseudophakia PMH-DM(on Lantus and Metformin), HTN, Arthritis    CURRENT MEDICATIONS: No current outpatient medications on file. (Ophthalmic Drugs)   No current facility-administered medications for this visit. (Ophthalmic Drugs)   Current Outpatient Medications (Other)  Medication Sig   amLODipine (NORVASC) 5 MG tablet Take 1 tablet by mouth daily.   atorvastatin (LIPITOR) 20 MG tablet Take 20 mg by mouth daily.   cloNIDine (CATAPRES) 0.1 MG tablet Take 1 tablet by mouth daily.   insulin glargine (LANTUS) 100 UNIT/ML Solostar Pen Inject 80 Units into the skin at bedtime.    losartan (COZAAR) 100 MG tablet Take 1 tablet by mouth daily.   Metoprolol Succinate 100 MG CS24 Take by mouth.   NOVOLOG FLEXPEN 100 UNIT/ML FlexPen INJECT 25-31 UNITS INTO THE SKIN THREE TIMES DAILY BEFORE MEALS   Current Facility-Administered Medications (Other)  Medication Route    Bevacizumab (AVASTIN) SOLN 1.25 mg Intravitreal   Bevacizumab (AVASTIN) SOLN 1.25 mg Intravitreal   Bevacizumab (AVASTIN) SOLN 1.25 mg Intravitreal   Bevacizumab (AVASTIN) SOLN 1.25 mg Intravitreal   Bevacizumab (AVASTIN) SOLN 1.25 mg Intravitreal      REVIEW OF SYSTEMS: ROS   Positive for: Endocrine, Eyes Negative for: Constitutional, Gastrointestinal, Neurological, Skin, Genitourinary, Musculoskeletal, HENT, Cardiovascular, Respiratory, Psychiatric, Allergic/Imm, Heme/Lymph Last edited by Roselee Nova D, COT on 06/30/2020 12:41 PM.       ALLERGIES No Known Allergies  PAST MEDICAL HISTORY Past Medical History:  Diagnosis Date   Ascending aortic aneurysm (HCC)    4.1 cm in 2019   Chronic back pain    Coronary artery disease    Status post CABG 2014 (LIMA to LAD, SVG to ramus, SVG to OM 2)   Dermatophytosis of the body    Eczema    Essential hypertension    GERD (gastroesophageal reflux disease)    Herpes zoster without mention of complication    History of bronchitis    History of claustrophobia    HOH (hard of hearing)    Mixed hyperlipidemia    Prostate cancer ([place])    Psoriasis and similar disorder    Sinusitis    Type 2 diabetes mellitus [facility])    Past Surgical History:  Procedure Laterality Date   CATARACT EXTRACTION W/PHACO Left 04/04/2014   Procedure: CATARACT  EXTRACTION PHACO AND INTRAOCULAR LENS PLACEMENT (IOC);  Surgeon: Williams Che, MD;  Location: [facility];  Service: Ophthalmology;  Laterality: Left;  CDE:4.49   CATARACT EXTRACTION W/PHACO Right 01/02/2015   Procedure: CATARACT EXTRACTION PHACO AND INTRAOCULAR LENS PLACEMENT; CDE:  6.36;  Surgeon: Williams Che, MD;  Location: [facility];  Service: Ophthalmology;  Laterality: Right;   CIRCUMCISION  2010   CORONARY ARTERY BYPASS GRAFT N/A 08/13/2012   Procedure: CORONARY ARTERY BYPASS GRAFTING (CABG);  Surgeon: Ivin Poot, MD;  Location: [place];  Service: Open Heart Surgery;  Laterality: N/A;    ENDOVEIN HARVEST OF GREATER SAPHENOUS VEIN Right 08/13/2012   Procedure: ENDOVEIN HARVEST OF GREATER SAPHENOUS VEIN;  Surgeon: Ivin Poot, MD;  Location: [place];  Service: Open Heart Surgery;  Laterality: Right;   INTRAOPERATIVE TRANSESOPHAGEAL ECHOCARDIOGRAM N/A 08/13/2012   Procedure: INTRAOPERATIVE TRANSESOPHAGEAL ECHOCARDIOGRAM;  Surgeon: Ivin Poot, MD;  Location: [place];  Service: Open Heart Surgery;  Laterality: N/A;   LEFT HEART CATHETERIZATION WITH CORONARY ANGIOGRAM N/A 08/07/2012   Procedure: LEFT HEART CATHETERIZATION WITH CORONARY ANGIOGRAM;  Surgeon: Peter M Martinique, MD;  Location[facility] C[facility] LAB;  Service: Cardiovascular;  Laterality: N/A;   RADIOACTIVE SEED IMPLANT N/A 11/25/2017   Procedure: RADIOACTIVE SEED IMPLANT/BRACHYTHERAPY IMPLANT;  Surgeon: Irine Seal, MD;  Location: [facility];  Service: Urology;  Laterality: N/A;   SEED IMPLANT DONE AT [place]  01/2017    FAMILY HISTORY Family History  Problem Relation Age of Onset   CAD Other    Heart attack Other    Diabetes Other    Cataracts Mother    Amblyopia Neg Hx    Blindness Neg Hx    Glaucoma Neg Hx    Macular degeneration Neg Hx    Retinal detachment Neg Hx    Strabismus Neg Hx    Retinitis pigmentosa Neg Hx     SOCIAL HISTORY Social History   Tobacco Use   Smoking status: Former    Packs/day: 1.00    Years: 4.00    Pack years: 4.00    Types: Cigarettes    Start date: 03/04/1965    Quit date: 03/05/1967    Years since quitting: 53.3   Smokeless tobacco: Never  Vaping Use   Vaping Use: Never used  Substance Use Topics   Alcohol use: No    Alcohol/week: 0.0 standard drinks   Drug use: No         OPHTHALMIC EXAM:  Base Eye Exam     Visual Acuity (Snellen - Linear)       Right Left   Dist Dasher 20/30 20/40 -1   Dist ph [place] NI 20/30 -2         Tonometry (Tonopen, 12:48 PM)       Right Left   Pressure 17 13         Pupils       Dark Light Shape React APD   Right  3 2 Round Brisk None   Left 3 2 Round Brisk None         Visual Fields (Counting fingers)       Left Right    Full Full         Extraocular Movement       Right Left    Full, Ortho Full, Ortho         Neuro/Psych     Oriented x3: Yes   Mood/Affect: Normal  Dilation     Both eyes: 1.0% Mydriacyl, 2.5% Phenylephrine @ 12:48 PM           Slit Lamp and Fundus Exam     Slit Lamp Exam       Right Left   Lids/Lashes Dermatochalasis - upper lid Dermatochalasis - upper lid   Conjunctiva/Sclera Temporal and Nasal Pinguecula Temporal and Nasal Pinguecula   Cornea trace Punctate epithelial erosions, Arcus, superior cataract wounds with refractile deposits, trace Debris in tear film Arcus, superior cataract wounds well healed, trace Debris in tear film   Anterior Chamber Deep and quiet, no cell or flare Deep and quiet, no cell or flare   Iris Round and dilated, No NVI Round and dilated, No NVI   Lens PC IOL in good position, open PC PC IOL in good position, 1+ non-central PCO   Vitreous Vitreous syneresis, Posterior vitreous detachment Vitreous syneresis, Posterior vitreous detachment         Fundus Exam       Right Left   Disc Pink and sharp, 360 Peripapillary atrophy Sharp, 360 Peripapillary atrophy, mild Pallor   C/D Ratio 0.4 0.5   Macula Flat, Blunted foveal reflex, Drusen, RPE mottling and clumping, interval progression of Atrophy, no heme Flat, Blunted foveal reflex, RPE mottling and clumping, Drusen, No heme or edema, interval progression of perifoveal ring of Atrophy   Vessels Vascular attenuation, AV crossing changes, Tortuous Mild Vascular attenuation, mild AV crossing changes   Periphery Attached, blonde fundus, mild reticular degeneration, No heme  Attached, irregular pigmented lesion along ST arcade about 1.5DD area, blonde fundus, mild reticular degeneration, No heme            Refraction     Manifest Refraction       Sphere Cylinder  Axis Dist VA   Right -0.25 +0.50 045 20/40   Left -0.75 +0.25 160 20/40            IMAGING AND PROCEDURES  Imaging and Procedures for @TODAY @  OCT, Retina - OU - Both Eyes       Right Eye Quality was good. Central Foveal Thickness: 296. Progression has been stable. Findings include normal foveal contour, retinal drusen , outer retinal atrophy, pigment epithelial detachment, no IRF, no SRF (Persistent SRHM / low PED nasal to disc - stable from prior; persistent patchy ORA; focal ellipsoid loss inferior to fovea - stable).   Left Eye Quality was good. Central Foveal Thickness: 309. Progression has worsened. Findings include normal foveal contour, no IRF, no SRF, retinal drusen , pigment epithelial detachment, outer retinal atrophy (Interval progression of ORA).   Notes *Images captured and stored on drive  Diagnosis / Impression:  Exudative ARMD OD -- Persistent SRHM / low PED nasal to disc - stable from prior; persistent patchy ORA; focal ellipsoid loss inferior to fovea - stable Nonexudative ARMD OS -- Interval progression of ORA   Clinical management:  See below  Abbreviations: NFP - Normal foveal profile. CME - cystoid macular edema. PED - pigment epithelial detachment. IRF - intraretinal fluid. SRF - subretinal fluid. EZ - ellipsoid zone. ERM - epiretinal membrane. ORA - outer retinal atrophy. ORT - outer retinal tubulation. SRHM - subretinal hyper-reflective material                ASSESSMENT/PLAN:    ICD-10-CM   1. Exudative age-related macular degeneration of right eye with active choroidal neovascularization ([place])  H35.3211     2. Retinal edema  H35.81 OCT, Retina -  OU - Both Eyes    3. Intermediate stage nonexudative age-related macular degeneration of left eye  H35.3122     4. Nevus of choroid of left eye  D31.32     5. Posterior vitreous detachment of both eyes  H43.813     6. Pseudophakia of both eyes  Z96.1     7. PCO (posterior capsular  opacification), bilateral  H26.493       1,2. Exudative age related macular degeneration, OD -- stable, inactive now  - peripapillary CNVM with +subretinal heme and edema, nasal disc -- stably improved today with no active SRF or heme  - S/P IVA OD #1 (05.15.19), #2 (07.01.19), #3 (08.13.19), #4 (09.30.19), #5 (01.15.20)  - OCT shows OD -- Persistent SRHM / PED nasal to disc without overlying fluid - stable from prior; OS: Interval progression of ORA/GA parafovea  - BCVA 20/30 OD, decreased from 20/25  - discussed findings and the import/ance of regular f/u and AREDS 2 supplements  - recommend holding off on IVA OD today - pt in agreement   - cont AREDS 2 supplements and Amsler grid monitoring  - F/U 6 months, sooner prn -- DFE, OCT  3. Age related macular degeneration, non-exudative, OS  - The incidence, anatomy, and pathology of dry AMD, risk of progression, and the AREDS and AREDS 2 study including smoking risks discussed with patient.  - OCT shows interval progression of ORA / GA  - Will continue to monitor  - recommend AREDS supplements and Amsler grid monitoring  4. Choroidal nevus OS-  - flat pigmented lesion along sup temp arcade  - no suspcious features -- low concern for choroidal melanoma  - monitor  5. PVD / vitreous syneresis OU  - Discussed findings and prognosis  - No RT or RD on 360 exam  - Reviewed s/s of RT/RD  - Strict return precautions for any such RT/RD signs/symptoms  6. Pseudophakia OU  - s/p CE/IOL OU by Dr. Iona Hansen in 2016  - doing well  - monitor  7. PCO OU -- OD>OS  - S/P YAG cap OD (05.29.19)  - looks great with PC nicely open OD  - OS not yet visually significant  - monitor    Ophthalmic Meds Ordered this visit:  No orders of the defined types were placed in this encounter.      Return in about 6 months (around 12/30/2020) for f/u exu ARMD OD, DFE, OCT.  There are no Patient Instructions on file for this visit.  This document serves  as a record of services personally performed by Gardiner Sleeper, MD, PhD. It was created on their behalf by San Jetty. Owens Shark, OA an ophthalmic technician. The creation of this record is the provider's dictation and/or activities during the visit.    Electronically signed by: San Jetty. Marguerita Merles 06.17.2022 10:57 PM  Gardiner Sleeper, M.D., Ph.D. Diseases & Surgery of the Retina and Vitreous [facility]  I have reviewed the above documentation for accuracy and completeness, and I agree with the above. Gardiner Sleeper, M.D., Ph.D. 07/02/20 11:00 PM   Abbreviations: M myopia (nearsighted); A astigmatism; H hyperopia (farsighted); P presbyopia; Mrx spectacle prescription;  CTL contact lenses; OD right eye; OS left eye; OU both eyes  XT exotropia; ET esotropia; PEK punctate epithelial keratitis; PEE punctate epithelial erosions; DES dry eye syndrome; MGD meibomian gland dysfunction; ATs artificial tears; PFAT's preservative free artificial tears; [place] nuclear sclerotic cataract; PSC posterior  subcapsular cataract; ERM epi-retinal membrane; PVD posterior vitreous detachment; RD retinal detachment; DM diabetes mellitus; DR diabetic retinopathy; NPDR non-proliferative diabetic retinopathy; PDR proliferative diabetic retinopathy; CSME clinically significant macular edema; DME diabetic macular edema; dbh dot blot hemorrhages; CWS cotton wool spot; POAG primary open angle glaucoma; C/D cup-to-disc ratio; HVF humphrey visual field; GVF goldmann visual field; OCT optical coherence tomography; IOP intraocular pressure; BRVO Branch retinal vein occlusion; CRVO central retinal vein occlusion; CRAO central retinal artery occlusion; BRAO branch retinal artery occlusion; RT retinal tear; SB scleral buckle; PPV pars plana vitrectomy; VH Vitreous hemorrhage; PRP panretinal laser photocoagulation; IVK intravitreal kenalog; VMT vitreomacular traction; MH Macular hole;  NVD neovascularization of the disc; NVE  neovascularization elsewhere; AREDS age related eye disease study; ARMD age related macular degeneration; POAG primary open angle glaucoma; EBMD epithelial/anterior basement membrane dystrophy; ACIOL anterior chamber intraocular lens; IOL intraocular lens; PCIOL posterior chamber intraocular lens; Phaco/IOL phacoemulsification with intraocular lens placement; [place] photorefractive keratectomy; LASIK laser assisted in situ keratomileusis; HTN hypertension; DM diabetes mellitus; COPD chronic obstructive pulmonary disease

## 2020-07-02 ENCOUNTER — Encounter (INDEPENDENT_AMBULATORY_CARE_PROVIDER_SITE_OTHER): Payer: Self-pay | Admitting: Ophthalmology

## 2020-07-05 ENCOUNTER — Encounter (HOSPITAL_COMMUNITY)
Admission: RE | Admit: 2020-07-05 | Discharge: 2020-07-05 | Disposition: A | Discharge status: Home / Self Care | Payer: Medicare HMO | Source: Ambulatory Visit | Attending: Cardiology | Admitting: Cardiology

## 2020-07-05 ENCOUNTER — Other Ambulatory Visit: Payer: Self-pay

## 2020-07-05 ENCOUNTER — Ambulatory Visit (HOSPITAL_COMMUNITY)
Admission: RE | Admit: 2020-07-05 | Discharge: 2020-07-05 | Disposition: A | Discharge status: Home / Self Care | Payer: Medicare HMO | Source: Ambulatory Visit | Attending: Cardiology | Admitting: Cardiology

## 2020-07-05 DIAGNOSIS — R0602 Shortness of breath: Secondary | ICD-10-CM

## 2020-07-05 DIAGNOSIS — I25119 Atherosclerotic heart disease of native coronary artery with unspecified angina pectoris: Secondary | ICD-10-CM | POA: Insufficient documentation

## 2020-07-05 LAB — ECHOCARDIOGRAM COMPLETE
AR max vel: 2.93 cm2
AV Area VTI: 2.91 cm2
AV Area mean vel: 2.45 cm2
AV Mean grad: 4 mmHg
AV Peak grad: 7.4 mmHg
Ao pk vel: 1.36 m/s
Area-P 1/2: 3.48 cm2
Calc EF: 37.8 %
MV VTI: 3.14 cm2
S' Lateral: 3.71 cm
Single Plane A2C EF: 40.7 %
Single Plane A4C EF: 34.7 %

## 2020-07-05 MED ORDER — PERFLUTREN LIPID MICROSPHERE
1.0000 mL | INTRAVENOUS | Status: AC | PRN
  Administered 2020-07-05: 3 mL via INTRAVENOUS

## 2020-07-05 NOTE — Progress Notes (Signed)
*  PRELIMINARY RESULTS* Echocardiogram 2D Echocardiogram has been performed with [facility].  Elpidio Anis 07/05/2020, 1:05 PM

## 2020-07-06 ENCOUNTER — Telehealth: Payer: Self-pay | Admitting: *Deleted

## 2020-07-06 DIAGNOSIS — I10 Essential (primary) hypertension: Secondary | ICD-10-CM

## 2020-07-06 DIAGNOSIS — Z79899 Other long term (current) drug therapy: Secondary | ICD-10-CM

## 2020-07-06 DIAGNOSIS — I25119 Atherosclerotic heart disease of native coronary artery with unspecified angina pectoris: Secondary | ICD-10-CM

## 2020-07-06 DIAGNOSIS — R0602 Shortness of breath: Secondary | ICD-10-CM

## 2020-07-06 MED ORDER — ENTRESTO 24-26 MG PO TABS: 1.0000 | ORAL_TABLET | Freq: Two times a day (BID) | ORAL | 6 refills | Status: DC

## 2020-07-06 NOTE — Telephone Encounter (Signed)
-----   Message from Erma Heritage, [place] sent at 07/05/2020  4:06 PM EDT ----- Covering for Dr. Domenic Polite - Please let the patient know his echocardiogram showed that the pumping function of his heart is reduced at 40 to 45% (normal is 55-65%).  He did have wall motion abnormalities and slightly abnormal relaxation of the heart muscle which is common to see with hypertension. Given his reduced EF, would recommend stopping Losartan and starting Entresto 24-26mg  BID as BP should allow for this based off his recent readings. Would provide 30-day card. Repeat BMET in 2 weeks.   It appears he was also scheduled for a Lexiscan Myoview today to assess for ischemia but was unable to have this due to claustrophobia. I believe [employer] has a sitting stress test option where he would not have to lie flat for the imaging. Would he be open to having it performed there? If he feels he would not be able to tolerate it, may need to consider a repeat catheterization given his symptoms and reduced EF. Would recommend scheduling follow-up in 2-3 weeks for medication titration and to further review testing options.

## 2020-07-06 NOTE — Telephone Encounter (Signed)
Patient informed and verbalized understanding of plan. Agrees to sitting stress test

## 2020-07-10 NOTE — Telephone Encounter (Signed)
Per [place]-I think we can hold off on doing the Myoview in [place] for now.  I would like to see how the medication changes go first and then talk with him in theoffice next.   Wife Carletta informed and verbalized understanding.

## 2020-07-13 DIAGNOSIS — E119 Type 2 diabetes mellitus without complications: Secondary | ICD-10-CM | POA: Diagnosis not present

## 2020-07-20 ENCOUNTER — Telehealth: Payer: Self-pay | Admitting: Cardiology

## 2020-07-20 NOTE — Telephone Encounter (Signed)
Pt is calling wanting to know his echo Results - says he has memory problems and doesn't remember what he was told

## 2020-07-20 NOTE — Telephone Encounter (Signed)
Left a message for pt to give [place] a call back regarding Echo results.

## 2020-07-21 ENCOUNTER — Encounter (HOSPITAL_COMMUNITY): Payer: Medicare HMO

## 2020-07-21 NOTE — Telephone Encounter (Signed)
Spoke to pt who needed a reminder about when to have lab work completed per his last echo result note. Pt was notified and verbalized understanding.

## 2020-07-26 DIAGNOSIS — Z299 Encounter for prophylactic measures, unspecified: Secondary | ICD-10-CM | POA: Diagnosis not present

## 2020-07-26 DIAGNOSIS — I509 Heart failure, unspecified: Secondary | ICD-10-CM | POA: Diagnosis not present

## 2020-07-26 DIAGNOSIS — I10 Essential (primary) hypertension: Secondary | ICD-10-CM | POA: Diagnosis not present

## 2020-07-26 DIAGNOSIS — E1151 Type 2 diabetes mellitus with diabetic peripheral angiopathy without gangrene: Secondary | ICD-10-CM | POA: Diagnosis not present

## 2020-07-26 DIAGNOSIS — E1165 Type 2 diabetes mellitus with hyperglycemia: Secondary | ICD-10-CM | POA: Diagnosis not present

## 2020-07-26 DIAGNOSIS — H353290 Exudative age-related macular degeneration, unspecified eye, stage unspecified: Secondary | ICD-10-CM | POA: Diagnosis not present

## 2020-08-09 NOTE — Progress Notes (Deleted)
Cardiology Office Note  Date: 08/09/2020   ID: George [name], George [name] 09-Sep-1945, MRN [id number]  PCP:  Monico Blitz, MD  Cardiologist:  Rozann Lesches, MD Electrophysiologist:  None   Chief Complaint: follow s/p initiation of Entresto  History of Present Illness: George [name] is a 75 y.o. male with a history of CAD, CABG, Ascending aortic aneurysm, HTN, HLD, DM2.   He was last seen by Dr. Domenic Polite on 06/19/2020 to reestablish follow-up CAD.  He stated he had been more short of breath and fatigue with activity over the last several months at that visit.  He had no specific anginal symptoms.  Plan was to obtain follow-up echocardiogram and Lexiscan Myoview given history of shortness of breath and coronary artery disease.  History of asymptomatic ascending thoracic aortic aneurysm 4.1 cm by CT imaging 2019.  Plan was to evaluate cardiac function first.  He was continuing Lipitor for hyperlipidemia.  Last LDL was 44.  He was continuing Cozaar and Norvasc for hypertension.  Dr. Domenic Polite mentioned he may need further medication up titration.  He had echo and was started on Entresto. He did not have stress test due to claustrophobia ? Bernerd Pho mentioned if could not undergo stress test may need to undergo cardiac catheterization.  Past Medical History:  Diagnosis Date   Ascending aortic aneurysm (HCC)    4.1 cm in 2019   Chronic back pain    Coronary artery disease    Status post CABG 2014 (LIMA to LAD, SVG to ramus, SVG to OM 2)   Dermatophytosis of the body    Eczema    Essential hypertension    GERD (gastroesophageal reflux disease)    Herpes zoster without mention of complication    History of bronchitis    History of claustrophobia    HOH (hard of hearing)    Mixed hyperlipidemia    Prostate cancer ([place])    Psoriasis and similar disorder    Sinusitis    Type 2 diabetes mellitus ([place])     Past Surgical History:  Procedure Laterality Date   CATARACT EXTRACTION W/PHACO  Left 04/04/2014   Procedure: CATARACT EXTRACTION PHACO AND INTRAOCULAR LENS PLACEMENT ([place]);  Surgeon: Williams Che, MD;  Location: [facility];  Service: Ophthalmology;  Laterality: Left;  CDE:4.49   CATARACT EXTRACTION W/PHACO Right 01/02/2015   Procedure: CATARACT EXTRACTION PHACO AND INTRAOCULAR LENS PLACEMENT; CDE:  6.36;  Surgeon: Williams Che, MD;  Location: [facility];  Service: Ophthalmology;  Laterality: Right;   CIRCUMCISION  2010   CORONARY ARTERY BYPASS GRAFT N/A 08/13/2012   Procedure: CORONARY ARTERY BYPASS GRAFTING (CABG);  Surgeon: Ivin Poot, MD;  Location: [place];  Service: Open Heart Surgery;  Laterality: N/A;   ENDOVEIN HARVEST OF GREATER SAPHENOUS VEIN Right 08/13/2012   Procedure: ENDOVEIN HARVEST OF GREATER SAPHENOUS VEIN;  Surgeon: Ivin Poot, MD;  Location: [place];  Service: Open Heart Surgery;  Laterality: Right;   INTRAOPERATIVE TRANSESOPHAGEAL ECHOCARDIOGRAM N/A 08/13/2012   Procedure: INTRAOPERATIVE TRANSESOPHAGEAL ECHOCARDIOGRAM;  Surgeon: Ivin Poot, MD;  Location: [place];  Service: Open Heart Surgery;  Laterality: N/A;   LEFT HEART CATHETERIZATION WITH CORONARY ANGIOGRAM N/A 08/07/2012   Procedure: LEFT HEART CATHETERIZATION WITH CORONARY ANGIOGRAM;  Surgeon: Peter M Martinique, MD;  Location: [facility] CATH [facility];  Service: Cardiovascular;  Laterality: N/A;   RADIOACTIVE SEED IMPLANT N/A 11/25/2017   Procedure: RADIOACTIVE SEED IMPLANT/BRACHYTHERAPY IMPLANT;  Surgeon: Irine Seal, MD;  Location: [place];  Service: Urology;  Laterality: N/A;   SEED IMPLANT DONE AT   01/2017    Current Outpatient Medications  Medication Sig Dispense Refill   amLODipine (NORVASC) 5 MG tablet Take 1 tablet by mouth daily.     atorvastatin (LIPITOR) 20 MG tablet Take 20 mg by mouth daily.     cloNIDine (CATAPRES) 0.1 MG tablet Take 1 tablet by mouth daily.     insulin glargine (LANTUS) 100 UNIT/ML Solostar Pen Inject 80 Units into the skin at bedtime.       Metoprolol Succinate 100 MG CS24 Take by mouth.     NOVOLOG FLEXPEN 100 UNIT/ML FlexPen INJECT 25-31 UNITS INTO THE SKIN THREE TIMES DAILY BEFORE MEALS 30 mL 0   sacubitril-valsartan (ENTRESTO) 24-26 MG Take 1 tablet by mouth 2 (two) times daily. 60 tablet 6   Current Facility-Administered Medications  Medication Dose Route Frequency Provider Last Rate Last Admin   Bevacizumab (AVASTIN) SOLN 1.25 mg  1.25 mg Intravitreal  Bernarda Caffey, MD   1.25 mg at 05/28/17 1541   Bevacizumab (AVASTIN) SOLN 1.25 mg  1.25 mg Intravitreal  Bernarda Caffey, MD   1.25 mg at 07/14/17 2241   Bevacizumab (AVASTIN) SOLN 1.25 mg  1.25 mg Intravitreal  Bernarda Caffey, MD   1.25 mg at 08/26/17 1530   Bevacizumab (AVASTIN) SOLN 1.25 mg  1.25 mg Intravitreal  Bernarda Caffey, MD   1.25 mg at 10/13/17 1530   Bevacizumab (AVASTIN) SOLN 1.25 mg  1.25 mg Intravitreal  Bernarda Caffey, MD   1.25 mg at 01/29/18 0845   Allergies:  Patient has no known allergies.   Social History: The patient  reports that he quit smoking about 53 years ago. His smoking use included cigarettes. He started smoking about 55 years ago. He has a 4.00 pack-year smoking history. He has never used smokeless tobacco. He reports that he does not drink alcohol and does not use drugs.   Family History: The patient's family history includes CAD in an other family member; Cataracts in his mother; Diabetes in an other family member; Heart attack in an other family member.   ROS:  Please see the history of present illness. Otherwise, complete review of systems is positive for {NONE DEFAULTED:18576}.  All other systems are reviewed and negative.   Physical Exam: VS:  There were no vitals taken for this visit., BMI There is no height or weight on file to calculate BMI.  Wt Readings from Last 3 Encounters:  06/19/20 240 lb (108.9 kg)  12/10/17 225 lb (102.1 kg)  11/25/17 219 lb 8 oz (99.6 kg)    General: Patient appears comfortable at rest. HEENT: Conjunctiva  and lids normal, oropharynx clear with moist mucosa. Neck: Supple, no elevated JVP or carotid bruits, no thyromegaly. Lungs: Clear to auscultation, nonlabored breathing at rest. Cardiac: Regular rate and rhythm, no S3 or significant systolic murmur, no pericardial rub. Abdomen: Soft, nontender, no hepatomegaly, bowel sounds present, no guarding or rebound. Extremities: No pitting edema, distal pulses 2+. Skin: Warm and dry. Musculoskeletal: No kyphosis. Neuropsychiatric: Alert and oriented x3, affect grossly appropriate.  ECG:  {EKG/Telemetry Strips Reviewed:[phone]}  Recent Labwork: No results found for requested labs within last 8760 hours.     Component Value Date/Time   CHOL 103 04/23/2017 0000   TRIG 123 04/23/2017 0000   HDL 34 (A) 04/23/2017 0000   LDLCALC 44 04/23/2017 0000    Other Studies Reviewed Today:  Echocardiogram 07/05/2020  1. Inferior hypokinesis abnormal septal motion with  hypokinesis . Left ventricular ejection fraction, by estimation, is 40 to 45%. The left ventricle has mildly decreased function. The left ventricle demonstrates regional wall motion abnormalities (see scoring diagram/findings for description). The left ventricular internal cavity size was mildly dilated. There is moderate asymmetric left ventricular hypertrophy of the basal and septal segments. Left ventricular diastolic parameters are consistent with Grade I diastolic dysfunction (impaired relaxation). 2. Right ventricular systolic function is normal. The right ventricular size is normal. There is normal pulmonary artery systolic pressure. 3. Left atrial size was moderately dilated. 4. The mitral valve is abnormal. Trivial mitral valve regurgitation. No evidence of mitral stenosis. 5. The aortic valve is tricuspid. Aortic valve regurgitation is trivial. Mild aortic valve sclerosis is present, with no evidence of aortic valve stenosis. 6. Aortic dilatation noted. There is mild dilatation  of the ascending aorta, measuring 41 mm. 7. The inferior vena cava is normal in size with greater than 50% respiratory variability, suggesting right atrial pressure of 3 mmHg.  Echocardiogram 10/29/2013: - Procedure narrative: Transthoracic echocardiography. Image    quality was suboptimal. The study was technically difficult, as a    result of poor sound wave transmission.  - Left ventricle: The cavity size was normal. Wall thickness was    increased in a pattern of moderate LVH. Systolic function was    normal. The estimated ejection fraction was in the range of 55%    to 60%. Regional wall motion was difficult to assess due to    suboptimal endocardial visualization, but no gross abnormalities    were seen. Diastolic dysfunction, grade indeterminate. Mildly    elevated left atrial pressures, medial E/e&' 11.8.  - Aortic valve: There was mild eccentric regurgitation.  - Aorta: Mildly dilated aortic root. Aortic root dimension: 44 mm    (ED).  - Left atrium: The atrium was mildly dilated.     GXT 12/25/2015: Blood pressure demonstrated a hypertensive response to exercise. There was no ST segment deviation noted during stress. Negative exercise stress test for ischemia Duke treadmill score of 7 consistent with low risk for major cardiac events.  CABG 08/13/2012 1. Coronary artery bypass grafting x3 (left internal mammary artery  LAD, saphenous vein graft to ramus intermediate, saphenous vein graft to OM2). 2. Endoscopic harvest of right leg greater saphenous vein.  Assessment and Plan:  1. SOB (shortness of breath)   2. Thoracic ascending aortic aneurysm ([place])   3. Essential hypertension   4. Mixed hyperlipidemia      Medication Adjustments/Labs and Tests Ordered: Current medicines are reviewed at length with the patient today.  Concerns regarding medicines are outlined above.   Disposition: Follow-up with ***  Signed, Levell July, NP 08/09/2020 7:59 PM    [place] at [facility] [place], [place], [place] [address] Phone: [phone]; Fax: [phone]

## 2020-08-10 ENCOUNTER — Ambulatory Visit: Payer: Medicare HMO | Admitting: Family Medicine

## 2020-08-10 DIAGNOSIS — I10 Essential (primary) hypertension: Secondary | ICD-10-CM

## 2020-08-10 DIAGNOSIS — I712 Thoracic aortic aneurysm, without rupture: Secondary | ICD-10-CM

## 2020-08-10 DIAGNOSIS — R0602 Shortness of breath: Secondary | ICD-10-CM

## 2020-08-10 DIAGNOSIS — E782 Mixed hyperlipidemia: Secondary | ICD-10-CM

## 2020-08-11 DIAGNOSIS — E119 Type 2 diabetes mellitus without complications: Secondary | ICD-10-CM | POA: Diagnosis not present

## 2020-08-13 DIAGNOSIS — E119 Type 2 diabetes mellitus without complications: Secondary | ICD-10-CM | POA: Diagnosis not present

## 2020-09-01 ENCOUNTER — Ambulatory Visit: Payer: Medicare HMO | Admitting: Family Medicine

## 2020-09-13 DIAGNOSIS — E119 Type 2 diabetes mellitus without complications: Secondary | ICD-10-CM | POA: Diagnosis not present

## 2020-09-18 NOTE — Progress Notes (Addendum)
Cardiology Office Note  Date: 09/19/2020   ID: George [name], DOB 03/24/1945, MRN [id number]  PCP:  Monico Blitz, MD  Cardiologist:  Rozann Lesches, MD Electrophysiologist:  None   Chief Complaint: 3-week follow up after starting Delene Loll  History of Present Illness: George [name] is a 75 y.o. male with a history of shortness of breath,  ascending thoracic aortic aneurysm, CAD status post CABG 2014 pain, HTN, GERD GERD, HLD, DM2.  He was last seen by Dr. Domenic Polite on 06/19/2020.  He stated he had more shortness of breath and fatigue with activity over the prior several months.  He denied any specific anginal symptoms, palpitations or dizziness.  History of known CAD status post CABG 2014.  LVEF 55 to 60% on echo 2015.  Plan was to obtain an echocardiogram and Lexiscan Myoview.  Previous asymptomatic ascending thoracic aortic aneurysm 4.1 cm by CT 2019.  Plan was to obtain echocardiogram and stress test first.  Sinus rhythm with PACs without palpitations plan was to continue observation.  He was continuing Cozaar and Norvasc.  Had follow-up with Dr. Manuella Ghazi.  Mentioned further medication up titration might be needed.  He was continuing Lipitor with last LDL 44.  He is here for follow-up today.  We discussed his recent echocardiogram results.  He has not had his follow-up basic metabolic panel after initiation of Entresto.  He denies any anginal or exertional symptoms, orthostatic symptoms, SOB or DOE.  His only complaint today is fatigue.  He denies any palpitations or arrhythmias, CVA or TIA-like symptoms.  Blood pressure is elevated today at 162/78.  Previous blood pressure in epic on 06/19/2020 was 160/70.  He states he previously could not tolerate being in the gamma camera for scanning and a horizontal position due to claustrophobia issues.  We talked about a stress test which she could be sitting and gamma camera would rotate around him without being inside a "tube" as he described which causes him  issues with claustrophobia.  Otherwise he denies any bleeding issues, claudication-like symptoms, DVT or PE-like symptoms.  Recent echo Demonstrated EF of 40 to 45% LV positive for R WMA's.  LV internal cavity size mildly dilated, moderate asymmetric LVH of basal septal segments.  G1 DD.  Trivial MR.  Trivial AR.  Mild dilatation of a sending aorta measuring 41 mm   Past Medical History:  Diagnosis Date   Ascending aortic aneurysm (HCC)    4.1 cm in 2019   Chronic back pain    Coronary artery disease    Status post CABG 2014 (LIMA to LAD, SVG to ramus, SVG to OM 2)   Dermatophytosis of the body    Eczema    Essential hypertension    GERD (gastroesophageal reflux disease)    Herpes zoster without mention of complication    History of bronchitis    History of claustrophobia    HOH (hard of hearing)    Mixed hyperlipidemia    Prostate cancer ([place])    Psoriasis and similar disorder    Sinusitis    Type 2 diabetes mellitus ([place])     Past Surgical History:  Procedure Laterality Date   CATARACT EXTRACTION W/PHACO Left 04/04/2014   Procedure: CATARACT EXTRACTION PHACO AND INTRAOCULAR LENS PLACEMENT ([place]);  Surgeon: Williams Che, MD;  Location: [facility];  Service: Ophthalmology;  Laterality: Left;  CDE:4.49   CATARACT EXTRACTION W/PHACO Right 01/02/2015   Procedure: CATARACT EXTRACTION PHACO AND INTRAOCULAR LENS PLACEMENT; CDE:  6.36;  Surgeon: Williams Che, MD;  [facility];  Service: Ophthalmology;  Laterality: Right;   CIRCUMCISION  2010   CORONARY ARTERY BYPASS GRAFT N/A 08/13/2012   Procedure: CORONARY ARTERY BYPASS GRAFTING (CABG);  Surgeon: Ivin Poot, MD;  Location: [place];  Service: Open Heart Surgery;  Laterality: N/A;   ENDOVEIN HARVEST OF GREATER SAPHENOUS VEIN Right 08/13/2012   Procedure: ENDOVEIN HARVEST OF GREATER SAPHENOUS VEIN;  Surgeon: Ivin Poot, MD;  Location: [place];  Service: Open Heart Surgery;  Laterality: Right;   INTRAOPERATIVE TRANSESOPHAGEAL  ECHOCARDIOGRAM N/A 08/13/2012   Procedure: INTRAOPERATIVE TRANSESOPHAGEAL ECHOCARDIOGRAM;  Surgeon: Ivin Poot, MD;  Location: [place];  Service: Open Heart Surgery;  Laterality: N/A;   LEFT HEART CATHETERIZATION WITH CORONARY ANGIOGRAM N/A 08/07/2012   Procedure: LEFT HEART CATHETERIZATION WITH CORONARY ANGIOGRAM;  Surgeon: Peter M Martinique, MD;  Location[facility];  Service: Cardiovascular;  Laterality: N/A;   RADIOACTIVE SEED IMPLANT N/A 11/25/2017   Procedure: RADIOACTIVE SEED IMPLANT/BRACHYTHERAPY IMPLANT;  Surgeon: Irine Seal, MD;  Location: [facility];  Service: Urology;  Laterality: N/A;   SEED IMPLANT DONE AT [place]  01/2017    Current Outpatient Medications  Medication Sig Dispense Refill   amLODipine (NORVASC) 5 MG tablet Take 1 tablet by mouth daily.     atorvastatin (LIPITOR) 20 MG tablet Take 20 mg by mouth daily.     cloNIDine (CATAPRES) 0.1 MG tablet Take 1 tablet by mouth daily.     insulin glargine (LANTUS) 100 UNIT/ML Solostar Pen Inject 80 Units into the skin at bedtime.      Metoprolol Succinate 100 MG CS24 Take by mouth.     NOVOLOG FLEXPEN 100 UNIT/ML FlexPen INJECT 25-31 UNITS INTO THE SKIN THREE TIMES DAILY BEFORE MEALS 30 mL 0   sacubitril-valsartan (ENTRESTO) 24-26 MG Take 1 tablet by mouth 2 (two) times daily. 60 tablet 6   No current facility-administered medications for this visit.   Allergies:  Patient has no known allergies.   Social History: The patient  reports that he quit smoking about 53 years ago. His smoking use included cigarettes. He started smoking about 55 years ago. He has a 4.00 pack-year smoking history. He has never used smokeless tobacco. He reports that he does not drink alcohol and does not use drugs.   Family History: The patient's family history includes CAD in an other family member; Cataracts in his mother; Diabetes in an other family member; Heart attack in an other family member.   ROS:  Please see the history  of present illness. Otherwise, complete review of systems is positive for none.  All other systems are reviewed and negative.   Physical Exam: VS:  BP (!) 162/78   Pulse 100   Ht '6\' 3"'$  (1.905 m)   Wt 237 lb 9.6 oz (107.8 kg)   SpO2 96%   BMI 29.70 kg/m , BMI Body mass index is 29.7 kg/m.  Wt Readings from Last 3 Encounters:  09/19/20 237 lb 9.6 oz (107.8 kg)  06/19/20 240 lb (108.9 kg)  12/10/17 225 lb (102.1 kg)    General: Patient appears comfortable at rest. Neck: Supple, no elevated JVP or carotid bruits, no thyromegaly. Lungs: Clear to auscultation, nonlabored breathing at rest. Cardiac: Regular rate and rhythm, no S3 or significant systolic murmur, no pericardial rub. Extremities: No pitting edema, distal pulses 2+. Skin: Warm and dry. Musculoskeletal: No kyphosis. Neuropsychiatric: Alert and oriented x3, affect grossly appropriate.  ECG:  EKG  June 19, 2020 sinus rhythm with frequent PACs, left axis deviation, LVH with QRS widening, abnormal QRST angle consider primary T wave abnormality heart rate of 94 bpm . Recent Labwork: No results found for requested labs within last 8760 hours.     Component Value Date/Time   CHOL 103 04/23/2017 0000   TRIG 123 04/23/2017 0000   HDL 34 (A) 04/23/2017 0000   LDLCALC 44 04/23/2017 0000    Other Studies Reviewed Today:   Echocardiogram 07/05/2020 1. Inferior hypokinesis abnormal septal motion with hypokinesis . Left ventricular ejection fraction, by estimation, is 40 to 45%. The left ventricle has mildly decreased function. The left ventricle demonstrates regional wall motion abnormalities (see scoring diagram/findings for description). The left ventricular internal cavity size was mildly dilated. There is moderate asymmetric left ventricular hypertrophy of the basal and septal segments. Left ventricular diastolic parameters are consistent with Grade I diastolic dysfunction (impaired relaxation). 2. Right ventricular systolic  function is normal. The right ventricular size is normal. There is normal pulmonary artery systolic pressure. 3. Left atrial size was moderately dilated. 4. The mitral valve is abnormal. Trivial mitral valve regurgitation. No evidence of mitral stenosis. 5. The aortic valve is tricuspid. Aortic valve regurgitation is trivial. Mild aortic valve sclerosis is present, with no evidence of aortic valve stenosis. 6. Aortic dilatation noted. There is mild dilatation of the ascending aorta, measuring 41 mm. 7. The inferior vena cava is normal in size with greater than 50% respiratory variability, suggesting right atrial pressure of 3 mmHg.  CTA of the chest and aorta 02/12/2017 IMPRESSION: No acute finding.   Re- demonstration of ascending aortic aneurysm measuring 4.1 cm. Recommend annual imaging followup by CTA or MRA. This recommendation follows 2010 ACCF/AHA/AATS/ACR/ASA/SCA/SCAI/SIR/STS/SVM Guidelines for the Diagnosis and Management of Patients with Thoracic Aortic Disease. Circulation. 2010; 121: [id number]   Aortic Atherosclerosis (ICD10-I70.0).   Surgical changes of prior median sternotomy and CABG, with native coronary calcifications.    Echocardiogram 10/29/2013: - Procedure narrative: Transthoracic echocardiography. Image    quality was suboptimal. The study was technically difficult, as a    result of poor sound wave transmission.  - Left ventricle: The cavity size was normal. Wall thickness was    increased in a pattern of moderate LVH. Systolic function was    normal. The estimated ejection fraction was in the range of 55%    to 60%. Regional wall motion was difficult to assess due to    suboptimal endocardial visualization, but no gross abnormalities    were seen. Diastolic dysfunction, grade indeterminate. Mildly    elevated left atrial pressures, medial E/e&' 11.8.  - Aortic valve: There was mild eccentric regurgitation.  - Aorta: Mildly dilated aortic root. Aortic root  dimension: 44 mm    (ED).  - Left atrium: The atrium was mildly dilated.     GXT 12/25/2015: Blood pressure demonstrated a hypertensive response to exercise. There was no ST segment deviation noted during stress. Negative exercise stress test for ischemia Duke treadmill score of 7 consistent with low risk for major cardiac events.   Assessment and Plan:  1. SOB (shortness of breath)   2. Thoracic ascending aortic aneurysm ([place])   3. Essential hypertension   4. Mixed hyperlipidemia    1. SOB (shortness of breath)/systolic dysfunction Currently denies any shortness of breath.  States his primary symptom is fatigue. Recent echo Demonstrated EF of 40 to 45% LV positive for R WMA's.  LV internal cavity size mildly dilated, moderate asymmetric LVH  of basal septal segments.  G1 DD.  Trivial MR.  Trivial AR.  Mild dilatation of a sending aorta measuring 41 mm.  Please get a sitting a Lexiscan stress test to assess for ischemia.  Patient agrees to have sitting test done.  Patient did not get follow-up basic metabolic panel.  Please have him get follow-up lab work in order for [place] to possibly adjust the [employer].  Continue Entresto 24/26 mg p.o. twice daily. Start Toprol XL 25 mg po daily.  2. Thoracic ascending aortic aneurysm (HCC) Recent echocardiogram showed aortic dilatation of 41 mm. Stable and unchanged from 2019.   3. Essential hypertension Pressure continues elevated at 162/78.  Increase amlodipine to 10 mg p.o. daily.  Continue clonidine 0.1 mg daily.  Continue Entresto 24/26 mg p.o. twice daily for both systolic dysfunction and hypertension.  4. Mixed hyperlipidemia Continue atorvastatin 20 mg p.o. daily.  Medication Adjustments/Labs and Tests Ordered: Current medicines are reviewed at length with the patient today.  Concerns regarding medicines are outlined above.   Disposition: Follow-up with Domenic Polite or APP 3 months  Signed, Levell July, NP 09/19/2020 9:16 AM    [facility] at [place], [place], [place] [address] Phone: [phone]; Fax: [phone]

## 2020-09-19 ENCOUNTER — Encounter: Payer: Self-pay | Admitting: *Deleted

## 2020-09-19 ENCOUNTER — Ambulatory Visit: Payer: Medicare HMO | Admitting: Family Medicine

## 2020-09-19 ENCOUNTER — Encounter: Payer: Self-pay | Admitting: Family Medicine

## 2020-09-19 ENCOUNTER — Other Ambulatory Visit: Payer: Self-pay

## 2020-09-19 ENCOUNTER — Other Ambulatory Visit: Payer: Self-pay | Admitting: *Deleted

## 2020-09-19 VITALS — BP 162/78 | HR 100 | Ht 75.0 in | Wt 237.6 lb

## 2020-09-19 DIAGNOSIS — R0602 Shortness of breath: Secondary | ICD-10-CM | POA: Diagnosis not present

## 2020-09-19 DIAGNOSIS — I7121 Aneurysm of the ascending aorta, without rupture: Secondary | ICD-10-CM

## 2020-09-19 DIAGNOSIS — E782 Mixed hyperlipidemia: Secondary | ICD-10-CM

## 2020-09-19 DIAGNOSIS — I712 Thoracic aortic aneurysm, without rupture: Secondary | ICD-10-CM

## 2020-09-19 DIAGNOSIS — I10 Essential (primary) hypertension: Secondary | ICD-10-CM | POA: Diagnosis not present

## 2020-09-19 DIAGNOSIS — R5383 Other fatigue: Secondary | ICD-10-CM | POA: Diagnosis not present

## 2020-09-19 MED ORDER — AMLODIPINE BESYLATE 10 MG PO TABS: 10.0000 mg | ORAL_TABLET | Freq: Every day | ORAL | 6 refills | Status: DC

## 2020-09-19 NOTE — Patient Instructions (Addendum)
Medication Instructions:  Increase Amlodipine to '10mg'$  daily.  Continue all other medications.     Labwork: BMET, Mg - orders given today.   Testing/Procedures: Your physician has requested that you have a lexiscan myoview. For further information please visit [email or web address]. Please follow instruction sheet, as given.  Follow-Up: Office will contact with results via phone or letter.   3 months   Any Other Special Instructions Will Be Listed Below (If Applicable).   If you need a refill on your cardiac medications before your next appointment, please call your pharmacy.

## 2020-09-20 ENCOUNTER — Telehealth (HOSPITAL_COMMUNITY): Payer: Self-pay | Admitting: *Deleted

## 2020-09-20 NOTE — Telephone Encounter (Signed)
Left message with wife; no DPR in reference to upcoming appointment scheduled for 09/27/20. Phone number given for a call back so details instructions can be given. Tamalyn Wadsworth, Ranae [place] No mychart available

## 2020-09-20 NOTE — Progress Notes (Addendum)
Left message to return call 

## 2020-09-22 NOTE — Addendum Note (Signed)
Addended by: Luiz Blare on: 09/22/2020 04:23 PM   Modules accepted: Orders

## 2020-09-22 NOTE — Progress Notes (Signed)
Left message to return call 

## 2020-09-25 ENCOUNTER — Telehealth (HOSPITAL_COMMUNITY): Payer: Self-pay | Admitting: *Deleted

## 2020-09-25 DIAGNOSIS — R0602 Shortness of breath: Secondary | ICD-10-CM | POA: Diagnosis not present

## 2020-09-25 NOTE — Telephone Encounter (Signed)
Attempted to call patient, no answer, unable to leave a message.  George [name]

## 2020-09-26 MED ORDER — METOPROLOL SUCCINATE ER 25 MG PO TB24: 25.0000 mg | ORAL_TABLET | Freq: Every day | ORAL | 6 refills | Status: DC

## 2020-09-26 NOTE — Progress Notes (Addendum)
Wife Programmer, applications) notified.  Will send new medication to [facility] now.

## 2020-09-26 NOTE — Addendum Note (Signed)
Addended by: Laurine Blazer on: 09/26/2020 02:31 PM   Modules accepted: Orders

## 2020-09-27 ENCOUNTER — Ambulatory Visit (HOSPITAL_COMMUNITY): Payer: Medicare HMO

## 2020-09-28 ENCOUNTER — Telehealth (HOSPITAL_COMMUNITY): Payer: Self-pay | Admitting: Family Medicine

## 2020-09-28 NOTE — Telephone Encounter (Signed)
Patient called and cancelled Myoview for the reason below:  09/27/20 09/27/2020 11:25 AM By: [id number], TONYA M  Cancel Rsn: Patient (not feeling well. LM for pt to call back to reschedule. TMY)  Order will be removed from the [place] and when pt calls back to reschedule we will reinstate the order. Thank you.

## 2020-10-11 ENCOUNTER — Telehealth (HOSPITAL_COMMUNITY): Payer: Self-pay | Admitting: *Deleted

## 2020-10-11 NOTE — Telephone Encounter (Signed)
Patient given detailed instructions per Myocardial Perfusion Study Information Sheet for the test on 10/16/20 at 7:45. Patient notified to arrive 15 minutes early and that it is imperative to arrive on time for appointment to keep from having the test rescheduled.  If you need to cancel or reschedule your appointment, please call the office within 24 hours of your appointment. . Patient verbalized understanding.George [name]

## 2020-10-13 DIAGNOSIS — E119 Type 2 diabetes mellitus without complications: Secondary | ICD-10-CM | POA: Diagnosis not present

## 2020-10-16 ENCOUNTER — Other Ambulatory Visit: Payer: Self-pay

## 2020-10-16 ENCOUNTER — Ambulatory Visit (HOSPITAL_COMMUNITY): Payer: Medicare HMO | Attending: Internal Medicine

## 2020-10-16 DIAGNOSIS — R5383 Other fatigue: Secondary | ICD-10-CM | POA: Insufficient documentation

## 2020-10-16 DIAGNOSIS — R0602 Shortness of breath: Secondary | ICD-10-CM | POA: Diagnosis not present

## 2020-10-16 LAB — MYOCARDIAL PERFUSION IMAGING
LV dias vol: 75 mL (ref 62–150)
LV sys vol: 145 mL
Nuc Stress EF: 48 %
Peak HR: 98 {beats}/min
Rest HR: 73 {beats}/min
Rest Nuclear Isotope Dose: 10.8 mCi
SDS: 0
SRS: 0
SSS: 0
ST Depression (mm): 0 mm
Stress Nuclear Isotope Dose: 30.3 mCi
TID: 0.92

## 2020-10-16 MED ORDER — TECHNETIUM TC 99M TETROFOSMIN IV KIT
10.8000 | PACK | Freq: Once | INTRAVENOUS | Status: AC | PRN
  Administered 2020-10-16: 10.8 via INTRAVENOUS
  Filled 2020-10-16: qty 11

## 2020-10-16 MED ORDER — TECHNETIUM TC 99M TETROFOSMIN IV KIT
30.3000 | PACK | Freq: Once | INTRAVENOUS | Status: AC | PRN
  Administered 2020-10-16: 30.3 via INTRAVENOUS
  Filled 2020-10-16: qty 31

## 2020-10-16 MED ORDER — REGADENOSON 0.4 MG/5ML IV SOLN
0.4000 mg | Freq: Once | INTRAVENOUS | Status: AC
Start: 2020-10-16 — End: 2020-10-16
  Administered 2020-10-16: 0.4 mg via INTRAVENOUS

## 2020-10-20 ENCOUNTER — Telehealth: Payer: Self-pay | Admitting: *Deleted

## 2020-10-20 DIAGNOSIS — I10 Essential (primary) hypertension: Secondary | ICD-10-CM

## 2020-10-20 MED ORDER — SACUBITRIL-VALSARTAN 49-51 MG PO TABS: 1.0000 | ORAL_TABLET | Freq: Two times a day (BID) | ORAL | 6 refills | Status: DC

## 2020-10-20 MED ORDER — METOPROLOL SUCCINATE ER 50 MG PO TB24
50.0000 mg | ORAL_TABLET | Freq: Every day | ORAL | 6 refills | Status: DC
Start: 2020-10-20 — End: 2022-05-02

## 2020-10-20 NOTE — Telephone Encounter (Signed)
-----   Message from Verta Ellen., NP sent at 10/19/2020 11:46 AM EDT ----- Please call the patient and let him know the stress test was considered negative from an ischemia standpoint.  It was determined to be an intermediate risk study due to decreased pumping function but we know he has a decreased pumping function on his recent echocardiogram.  The actual calculated pumping function on the stress test is slightly better than the recent echocardiogram at 48% versus 40 to 45% pumping function on echocardiogram on 07/05/2020.  We can increase his Entresto to 49/51 mg p.o. twice daily and get a follow-up basic metabolic panel and magnesium in 2 weeks after starting the increased dose.  Probably need to increase his Toprol to 50 mg daily also.  He has enough room on his blood pressure and heart rate today to both.  Call him and ask him if he is willing to do that we can see if this helps.  Thank you  Verta Ellen, NP  10/19/2020 11:41 AM

## 2020-10-20 NOTE — Telephone Encounter (Signed)
Laurine Blazer, LPN  [id number]  0:63 PM EDT Back to Top    Notified wife (Carletta), copy to pcp.  New dose on the [facility] sent to [facility] now.  Lab order mailed - he will do in 2 weeks at [facility].

## 2020-11-13 DIAGNOSIS — E119 Type 2 diabetes mellitus without complications: Secondary | ICD-10-CM | POA: Diagnosis not present

## 2020-12-13 DIAGNOSIS — E119 Type 2 diabetes mellitus without complications: Secondary | ICD-10-CM | POA: Diagnosis not present

## 2020-12-19 DIAGNOSIS — I509 Heart failure, unspecified: Secondary | ICD-10-CM | POA: Diagnosis not present

## 2020-12-19 DIAGNOSIS — B354 Tinea corporis: Secondary | ICD-10-CM | POA: Diagnosis not present

## 2020-12-19 DIAGNOSIS — Z6831 Body mass index (BMI) 31.0-31.9, adult: Secondary | ICD-10-CM | POA: Diagnosis not present

## 2020-12-19 DIAGNOSIS — Z299 Encounter for prophylactic measures, unspecified: Secondary | ICD-10-CM | POA: Diagnosis not present

## 2020-12-19 DIAGNOSIS — I10 Essential (primary) hypertension: Secondary | ICD-10-CM | POA: Diagnosis not present

## 2020-12-19 DIAGNOSIS — E1165 Type 2 diabetes mellitus with hyperglycemia: Secondary | ICD-10-CM | POA: Diagnosis not present

## 2020-12-28 NOTE — Progress Notes (Shared)
[facility] Note  12/29/2020     CHIEF COMPLAINT Patient presents for No chief complaint on file.   HISTORY OF PRESENT ILLNESS: George [name] is a 75 y.o. male who presents to the clinic today for:    Pt states vision is the same, he is taking AREDS BID  Referring physician: Monico Blitz, MD [place],  [place] [address]  HISTORICAL INFORMATION:   Selected notes from the MEDICAL RECORD NUMBER Referred by Dr. Rocky Link for retinal hemorrhage LEE: 05.10.19 (R. Turner) [BCVA: OD: 20/20 OS: 20/20] Ocular Hx-Pseudophakia PMH-DM(on Lantus and Metformin), HTN, Arthritis    CURRENT MEDICATIONS: No current outpatient medications on file. (Ophthalmic Drugs)   No current facility-administered medications for this visit. (Ophthalmic Drugs)   Current Outpatient Medications (Other)  Medication Sig   amLODipine (NORVASC) 10 MG tablet Take 1 tablet (10 mg total) by mouth daily.   atorvastatin (LIPITOR) 20 MG tablet Take 20 mg by mouth daily.   cloNIDine (CATAPRES) 0.1 MG tablet Take 1 tablet by mouth daily.   insulin glargine (LANTUS) 100 UNIT/ML Solostar Pen Inject 80 Units into the skin at bedtime.    metoprolol succinate (TOPROL XL) 50 MG 24 hr tablet Take 1 tablet (50 mg total) by mouth daily.   NOVOLOG FLEXPEN 100 UNIT/ML FlexPen INJECT 25-31 UNITS INTO THE SKIN THREE TIMES DAILY BEFORE MEALS   sacubitril-valsartan (ENTRESTO) 49-51 MG Take 1 tablet by mouth 2 (two) times daily.   No current facility-administered medications for this visit. (Other)      REVIEW OF SYSTEMS:     ALLERGIES No Known Allergies  PAST MEDICAL HISTORY Past Medical History:  Diagnosis Date   Ascending aortic aneurysm (HCC)    4.1 cm in 2019   Chronic back pain    Coronary artery disease    Status post CABG 2014 (LIMA to LAD, SVG to ramus, SVG to OM 2)   Dermatophytosis of the body    Eczema    Essential hypertension    GERD (gastroesophageal reflux  disease)    Herpes zoster without mention of complication    History of bronchitis    History of claustrophobia    HOH (hard of hearing)    Mixed hyperlipidemia    Prostate cancer ([place])    Psoriasis and similar disorder    Sinusitis    Type 2 diabetes mellitus ([place])    Past Surgical History:  Procedure Laterality Date   CATARACT EXTRACTION W/PHACO Left 04/04/2014   Procedure: CATARACT EXTRACTION PHACO AND INTRAOCULAR LENS PLACEMENT ([place]);  Surgeon: Williams Che, MD;  [facility]: [facility];  Service: Ophthalmology;  Laterality: Left;  CDE:4.49   CATARACT EXTRACTION W/PHACO Right 01/02/2015   Procedure: CATARACT EXTRACTION PHACO AND INTRAOCULAR LENS PLACEMENT; CDE:  6.36;  Surgeon: Williams Che, MD;  Location: [facility];  Service: Ophthalmology;  Laterality: Right;   CIRCUMCISION  2010   CORONARY ARTERY BYPASS GRAFT N/A 08/13/2012   Procedure: CORONARY ARTERY BYPASS GRAFTING (CABG);  Surgeon: Ivin Poot, MD;  Location: [place];  Service: Open Heart Surgery;  Laterality: N/A;   ENDOVEIN HARVEST OF GREATER SAPHENOUS VEIN Right 08/13/2012   Procedure: ENDOVEIN HARVEST OF GREATER SAPHENOUS VEIN;  Surgeon: Ivin Poot, MD;  Location: [place];  Service: Open Heart Surgery;  Laterality: Right;   INTRAOPERATIVE TRANSESOPHAGEAL ECHOCARDIOGRAM N/A 08/13/2012   Procedure: INTRAOPERATIVE TRANSESOPHAGEAL ECHOCARDIOGRAM;  Surgeon: Ivin Poot, MD;  Location: [place];  Service: Open Heart Surgery;  Laterality: N/A;   LEFT HEART CATHETERIZATION WITH CORONARY ANGIOGRAM N/A 08/07/2012   Procedure: LEFT HEART CATHETERIZATION WITH CORONARY ANGIOGRAM;  Surgeon: Peter M Martinique, MD;  Location: [facility] CATH LAB;  Service: Cardiovascular;  Laterality: N/A;   RADIOACTIVE SEED IMPLANT N/A 11/25/2017   Procedure: RADIOACTIVE SEED IMPLANT/BRACHYTHERAPY IMPLANT;  Surgeon: Irine Seal, MD;  Location: [facility];  Service: Urology;  Laterality: N/A;   SEED IMPLANT DONE AT [facility]  01/2017    FAMILY  HISTORY Family History  Problem Relation Age of Onset   CAD Other    Heart attack Other    Diabetes Other    Cataracts Mother    Amblyopia Neg Hx    Blindness Neg Hx    Glaucoma Neg Hx    Macular degeneration Neg Hx    Retinal detachment Neg Hx    Strabismus Neg Hx    Retinitis pigmentosa Neg Hx     SOCIAL HISTORY Social History   Tobacco Use   Smoking status: Former    Packs/day: 1.00    Years: 4.00    Pack years: 4.00    Types: Cigarettes    Start date: 03/04/1965    Quit date: 03/05/1967    Years since quitting: 53.8   Smokeless tobacco: Never  Vaping Use   Vaping Use: Never used  Substance Use Topics   Alcohol use: No    Alcohol/week: 0.0 standard drinks   Drug use: No         OPHTHALMIC EXAM:  Not recorded     IMAGING AND PROCEDURES  Imaging and Procedures for @TODAY @             ASSESSMENT/PLAN:    ICD-10-CM   1. Exudative age-related macular degeneration of right eye with active choroidal neovascularization ([place])  H35.3211     2. Retinal edema  H35.81     3. Intermediate stage nonexudative age-related macular degeneration of left eye  H35.3122     4. Nevus of choroid of left eye  D31.32     5. Posterior vitreous detachment of both eyes  H43.813     6. Pseudophakia of both eyes  Z96.1     7. PCO (posterior capsular opacification), bilateral  H26.493        1,2. Exudative age related macular degeneration, OD -- stable, inactive now  - peripapillary CNVM with +subretinal heme and edema, nasal disc -- stably improved today with no active SRF or heme  - S/P IVA OD #1 (05.15.19), #2 (07.01.19), #3 (08.13.19), #4 (09.30.19), #5 (01.15.20)  - OCT shows OD -- Persistent SRHM / PED nasal to disc without overlying fluid - stable from prior; OS: Interval progression of ORA/GA parafovea  - BCVA 20/30 OD, decreased from 20/25  - discussed findings and the import/ance of regular f/u and AREDS 2 supplements  - recommend holding off on IVA OD  today - pt in agreement    - cont AREDS 2 supplements and Amsler grid monitoring  - F/U 6 months, sooner prn -- DFE, OCT  3. Age related macular degeneration, non-exudative, OS  - The incidence, anatomy, and pathology of dry AMD, risk of progression, and the AREDS and AREDS 2 study including smoking risks discussed with patient.   - OCT shows interval progression of ORA / GA  - Will continue to monitor  - recommend AREDS supplements and Amsler grid monitoring  4. Choroidal nevus OS-  - flat pigmented lesion along sup temp arcade  - no suspcious  features -- low concern for choroidal melanoma  - monitor   5. PVD / vitreous syneresis OU  - Discussed findings and prognosis   - No RT or RD on 360 exam  - Reviewed s/s of RT/RD  - Strict return precautions for any such RT/RD signs/symptoms  6. Pseudophakia OU  - s/p CE/IOL OU by Dr. Iona Hansen in 2016  - doing well  - monitor   7. PCO OU -- OD>OS  - S/P YAG cap OD (05.29.19)  - looks great with PC nicely open OD  - OS not yet visually significant  - monitor     Ophthalmic Meds Ordered this visit:  No orders of the defined types were placed in this encounter.      No follow-ups on file.  There are no Patient Instructions on file for this visit.  This document serves as a record of services personally performed by Gardiner Sleeper, MD, PhD. It was created on their behalf by Leonie Douglas, an ophthalmic technician. The creation of this record is the provider's dictation and/or activities during the visit.    Electronically signed by: Leonie Douglas COA, 12/28/20  1:57 PM   Gardiner Sleeper, M.D., Ph.D. Diseases & Surgery of the Retina and [place] 12/29/2020   Abbreviations: M myopia (nearsighted); A astigmatism; H hyperopia (farsighted); P presbyopia; Mrx spectacle prescription;  CTL contact lenses; OD right eye; OS left eye; OU both eyes  XT exotropia; ET esotropia; PEK punctate epithelial  keratitis; PEE punctate epithelial erosions; DES dry eye syndrome; MGD meibomian gland dysfunction; ATs artificial tears; PFAT's preservative free artificial tears; [place] nuclear sclerotic cataract; PSC posterior subcapsular cataract; ERM epi-retinal membrane; PVD posterior vitreous detachment; RD retinal detachment; DM diabetes mellitus; DR diabetic retinopathy; NPDR non-proliferative diabetic retinopathy; PDR proliferative diabetic retinopathy; CSME clinically significant macular edema; DME diabetic macular edema; dbh dot blot hemorrhages; CWS cotton wool spot; POAG primary open angle glaucoma; C/D cup-to-disc ratio; HVF humphrey visual field; GVF goldmann visual field; OCT optical coherence tomography; IOP intraocular pressure; BRVO Branch retinal vein occlusion; CRVO central retinal vein occlusion; CRAO central retinal artery occlusion; BRAO branch retinal artery occlusion; RT retinal tear; SB scleral buckle; PPV pars plana vitrectomy; VH Vitreous hemorrhage; PRP panretinal laser photocoagulation; IVK intravitreal kenalog; VMT vitreomacular traction; MH Macular hole;  NVD neovascularization of the disc; NVE neovascularization elsewhere; AREDS age related eye disease study; ARMD age related macular degeneration; POAG primary open angle glaucoma; EBMD epithelial/anterior basement membrane dystrophy; ACIOL anterior chamber intraocular lens; IOL intraocular lens; PCIOL posterior chamber intraocular lens; Phaco/IOL phacoemulsification with intraocular lens placement; [place] photorefractive keratectomy; LASIK laser assisted in situ keratomileusis; HTN hypertension; DM diabetes mellitus; COPD chronic obstructive pulmonary disease

## 2020-12-29 ENCOUNTER — Encounter (INDEPENDENT_AMBULATORY_CARE_PROVIDER_SITE_OTHER): Payer: Self-pay | Admitting: Ophthalmology

## 2020-12-29 ENCOUNTER — Encounter (INDEPENDENT_AMBULATORY_CARE_PROVIDER_SITE_OTHER): Payer: Medicare HMO | Admitting: Ophthalmology

## 2020-12-29 ENCOUNTER — Ambulatory Visit (INDEPENDENT_AMBULATORY_CARE_PROVIDER_SITE_OTHER): Payer: Medicare HMO | Admitting: Ophthalmology

## 2020-12-29 ENCOUNTER — Other Ambulatory Visit: Payer: Self-pay

## 2020-12-29 DIAGNOSIS — H353122 Nonexudative age-related macular degeneration, left eye, intermediate dry stage: Secondary | ICD-10-CM | POA: Diagnosis not present

## 2020-12-29 DIAGNOSIS — Z961 Presence of intraocular lens: Secondary | ICD-10-CM

## 2020-12-29 DIAGNOSIS — H26493 Other secondary cataract, bilateral: Secondary | ICD-10-CM

## 2020-12-29 DIAGNOSIS — H43813 Vitreous degeneration, bilateral: Secondary | ICD-10-CM | POA: Diagnosis not present

## 2020-12-29 DIAGNOSIS — D3132 Benign neoplasm of left choroid: Secondary | ICD-10-CM

## 2020-12-29 DIAGNOSIS — H353211 Exudative age-related macular degeneration, right eye, with active choroidal neovascularization: Secondary | ICD-10-CM

## 2020-12-29 NOTE — Progress Notes (Signed)
[facility] Note  12/29/2020     CHIEF COMPLAINT Patient presents for Retina Follow Up  HISTORY OF PRESENT ILLNESS: George [name] is a 75 y.o. male who presents to the clinic today for:   HPI     Retina Follow Up   Patient presents with  Wet AMD.  In right eye.  This started 6 months ago.  I, the attending physician,  performed the HPI with the patient and updated documentation appropriately.        Comments   Patient here for 6 months retina follow up for exu ARMD OD. Patient states vision getting worse all the time. No eye pain. Eyes are dry. OS itches, burns and waters.      Last edited by Bernarda Caffey, MD on 12/31/2020  2:29 AM.    Pt states his vision is slowly getting worse, his left eye runs a lot, especially when he is in the cold  Referring physician: Monico Blitz, MD [place],  [place] [address]  HISTORICAL INFORMATION:   Selected notes from the [facility] Referred by Dr. Rocky Link for retinal hemorrhage LEE: 05.10.19 (R. Turner) [BCVA: OD: 20/20 OS: 20/20] Ocular Hx-Pseudophakia PMH-DM(on Lantus and Metformin), HTN, Arthritis    CURRENT MEDICATIONS: No current outpatient medications on file. (Ophthalmic Drugs)   No current facility-administered medications for this visit. (Ophthalmic Drugs)   Current Outpatient Medications (Other)  Medication Sig   amLODipine (NORVASC) 10 MG tablet Take 1 tablet (10 mg total) by mouth daily.   atorvastatin (LIPITOR) 20 MG tablet Take 20 mg by mouth daily.   cloNIDine (CATAPRES) 0.1 MG tablet Take 1 tablet by mouth daily.   insulin glargine (LANTUS) 100 UNIT/ML Solostar Pen Inject 80 Units into the skin at bedtime.    metoprolol succinate (TOPROL XL) 50 MG 24 hr tablet Take 1 tablet (50 mg total) by mouth daily.   NOVOLOG FLEXPEN 100 UNIT/ML FlexPen INJECT 25-31 UNITS INTO THE SKIN THREE TIMES DAILY BEFORE MEALS   sacubitril-valsartan (ENTRESTO) 49-51 MG Take 1 tablet by  mouth 2 (two) times daily.   No current facility-administered medications for this visit. (Other)   REVIEW OF SYSTEMS: ROS   Positive for: Endocrine, Eyes Negative for: Constitutional, Gastrointestinal, Neurological, Skin, Genitourinary, Musculoskeletal, HENT, Cardiovascular, Respiratory, Psychiatric, Allergic/Imm, Heme/Lymph Last edited by Theodore Demark, COA on 12/29/2020  2:07 PM.     ALLERGIES No Known Allergies  PAST MEDICAL HISTORY Past Medical History:  Diagnosis Date   Ascending aortic aneurysm    4.1 cm in 2019   Chronic back pain    Coronary artery disease    Status post CABG 2014 (LIMA to LAD, SVG to ramus, SVG to OM 2)   Dermatophytosis of the body    Eczema    Essential hypertension    GERD (gastroesophageal reflux disease)    Herpes zoster without mention of complication    History of bronchitis    History of claustrophobia    HOH (hard of hearing)    Mixed hyperlipidemia    Prostate cancer ([place])    Psoriasis and similar disorder    Sinusitis    Type 2 diabetes mellitus ([place])    Past Surgical History:  Procedure Laterality Date   CATARACT EXTRACTION W/PHACO Left 04/04/2014   Procedure: CATARACT EXTRACTION PHACO AND INTRAOCULAR LENS PLACEMENT ([place]);  Surgeon: Williams Che, MD;  [facility];  Service: Ophthalmology;  Laterality: Left;  CDE:4.49   CATARACT  EXTRACTION W/PHACO Right 01/02/2015   Procedure: CATARACT EXTRACTION PHACO AND INTRAOCULAR LENS PLACEMENT; CDE:  6.36;  Surgeon: Williams Che, MD;  [facility];  Service: Ophthalmology;  Laterality: Right;   CIRCUMCISION  2010   CORONARY ARTERY BYPASS GRAFT N/A 08/13/2012   Procedure: CORONARY ARTERY BYPASS GRAFTING (CABG);  Surgeon: Ivin Poot, MD;  Location: [place];  Service: Open Heart Surgery;  Laterality: N/A;   ENDOVEIN HARVEST OF GREATER SAPHENOUS VEIN Right 08/13/2012   Procedure: ENDOVEIN HARVEST OF GREATER SAPHENOUS VEIN;  Surgeon: Ivin Poot, MD;  Location: [facility];   Service: Open Heart Surgery;  Laterality: Right;   INTRAOPERATIVE TRANSESOPHAGEAL ECHOCARDIOGRAM N/A 08/13/2012   Procedure: INTRAOPERATIVE TRANSESOPHAGEAL ECHOCARDIOGRAM;  Surgeon: Ivin Poot, MD;  Location: [place];  Service: Open Heart Surgery;  Laterality: N/A;   LEFT HEART CATHETERIZATION WITH CORONARY ANGIOGRAM N/A 08/07/2012   Procedure: LEFT HEART CATHETERIZATION WITH CORONARY ANGIOGRAM;  Surgeon: Peter M Martinique, MD;  Location[facility] C[facility] LAB;  Service: Cardiovascular;  Laterality: N/A;   RADIOACTIVE SEED IMPLANT N/A 11/25/2017   Procedure: RADIOACTIVE SEED IMPLANT/BRACHYTHERAPY IMPLANT;  Surgeon: Irine Seal, MD;  Location: [facility];  Service: Urology;  Laterality: N/A;   SEED IMPLANT DONE AT [place]  01/2017    FAMILY HISTORY Family History  Problem Relation Age of Onset   CAD Other    Heart attack Other    Diabetes Other    Cataracts Mother    Amblyopia Neg Hx    Blindness Neg Hx    Glaucoma Neg Hx    Macular degeneration Neg Hx    Retinal detachment Neg Hx    Strabismus Neg Hx    Retinitis pigmentosa Neg Hx     SOCIAL HISTORY Social History   Tobacco Use   Smoking status: Former    Packs/day: 1.00    Years: 4.00    Pack years: 4.00    Types: Cigarettes    Start date: 03/04/1965    Quit date: 03/05/1967    Years since quitting: 53.8   Smokeless tobacco: Never  Vaping Use   Vaping Use: Never used  Substance Use Topics   Alcohol use: No    Alcohol/week: 0.0 standard drinks   Drug use: No       OPHTHALMIC EXAM:  Base Eye Exam     Visual Acuity (Snellen - Linear)       Right Left   Dist Susan Moore 20/30 20/40 -2   Dist ph [place] NI NI         Tonometry (Tonopen, 2:04 PM)       Right Left   Pressure 11 07         Pupils       Dark Light Shape React APD   Right 3 2 Round Brisk None   Left 3 2 Round Brisk None         Visual Fields (Counting fingers)       Left Right    Full Full         Extraocular Movement       Right  Left    Full, Ortho Full, Ortho         Neuro/Psych     Oriented x3: Yes   Mood/Affect: Normal         Dilation     Both eyes: 1.0% Mydriacyl, 2.5% Phenylephrine @ 2:04 PM           Slit Lamp and Fundus Exam  Slit Lamp Exam       Right Left   Lids/Lashes Dermatochalasis - upper lid Dermatochalasis - upper lid   Conjunctiva/Sclera Temporal and Nasal Pinguecula Temporal and Nasal Pinguecula   Cornea trace Punctate epithelial erosions, Arcus, superior cataract wounds with refractile deposits, tear film debris Arcus, superior cataract wounds well healed, trace PEE   Anterior Chamber Deep and quiet, no cell or flare Deep and quiet, no cell or flare   Iris Round and dilated, No NVI Round and dilated, No NVI   Lens PC IOL in good position, open PC PC IOL in good position, 1+ non-central PCO   Anterior Vitreous Vitreous syneresis, Posterior vitreous detachment Vitreous syneresis, Posterior vitreous detachment         Fundus Exam       Right Left   Disc Pink and sharp, 360 Peripapillary atrophy Sharp, 360 Peripapillary atrophy, mild Pallor   C/D Ratio 0.4 0.5   Macula Flat, Blunted foveal reflex, Drusen, RPE mottling and clumping, interval progression of Atrophy, no heme Flat, Blunted foveal reflex, RPE mottling and clumping, Drusen, No heme or edema, interval progression of perifoveal ring of Atrophy   Vessels Vascular attenuation, AV crossing changes, Tortuous Mild Vascular attenuation, mild AV crossing changes   Periphery Attached, blonde fundus, mild reticular degeneration, No heme Attached, irregular pigmented lesion along ST arcade about 1.5DD area, blonde fundus, mild reticular degeneration, No heme            IMAGING AND PROCEDURES  Imaging and Procedures for @TODAY @  OCT, Retina - OU - Both Eyes       Right Eye Quality was good. Central Foveal Thickness: 302. Progression has worsened. Findings include normal foveal contour, retinal drusen , outer  retinal atrophy, pigment epithelial detachment, no IRF, no SRF (mild progression of ORA; focal ellipsoid loss inferior to fovea - stable).   Left Eye Quality was good. Central Foveal Thickness: 325. Progression has worsened. Findings include normal foveal contour, no IRF, no SRF, retinal drusen , pigment epithelial detachment, outer retinal atrophy (Interval increase in ORA/PED/SRHM).   Notes *Images captured and stored on drive  Diagnosis / Impression:  History of Exudative ARMD OD -- mild progression of ORA; focal ellipsoid loss inferior to fovea - stable Nonexudative ARMD OS -- Interval increase in ORA/PED/SRHM   Clinical management:  See below  Abbreviations: NFP - Normal foveal profile. CME - cystoid macular edema. PED - pigment epithelial detachment. IRF - intraretinal fluid. SRF - subretinal fluid. EZ - ellipsoid zone. ERM - epiretinal membrane. ORA - outer retinal atrophy. ORT - outer retinal tubulation. SRHM - subretinal hyper-reflective material             ASSESSMENT/PLAN:    ICD-10-CM   1. Exudative age-related macular degeneration of right eye with active choroidal neovascularization (HCC)  H35.3211 OCT, Retina - OU - Both Eyes    2. Intermediate stage nonexudative age-related macular degeneration of left eye  H35.3122 OCT, Retina - OU - Both Eyes    3. Nevus of choroid of left eye  D31.32     4. Posterior vitreous detachment of both eyes  H43.813     5. Pseudophakia of both eyes  Z96.1     6. PCO (posterior capsular opacification), bilateral  H26.493       1. History of Exudative age related macular degeneration, OD -- stable, inactive now  - peripapillary CNVM with +subretinal heme and edema, nasal disc -- stably improved today with no active SRF or  heme  - S/P IVA OD #1 (05.15.19), #2 (07.01.19), #3 (08.13.19), #4 (09.30.19), #5 (01.15.20)  - OCT shows OD -- mild progression of ORA; focal ellipsoid loss inferior to fovea - stable  - BCVA stable 20/30  OD  - discussed findings and the import/ance of regular f/u and AREDS 2 supplements  - recommend holding off on IVA OD today - pt in agreement   - cont AREDS 2 supplements and Amsler grid monitoring  - F/U 6 months, sooner prn -- DFE, OCT  2. Age related macular degeneration, non-exudative, OS  - OCT shows interval increase in ORA/PED/SRHM OS  - Will continue to monitor  - recommend AREDS supplements and Amsler grid monitoring  3. Choroidal nevus OS-  - flat pigmented lesion along sup temp arcade  - no suspcious features -- low concern for choroidal melanoma  - monitor  4. PVD / vitreous syneresis OU  - Discussed findings and prognosis  - No RT or RD on 360 exam  - Reviewed s/s of RT/RD  - Strict return precautions for any such RT/RD signs/symptoms  5. Pseudophakia OU  - s/p CE/IOL OU by Dr. Iona Hansen in 2016  - doing well  - monitor  6. PCO OU -- OD>OS  - S/P YAG cap OD (05.29.19)  - looks great with PC nicely open OD  - OS not yet visually significant  - monitor  Ophthalmic Meds Ordered this visit:  No orders of the defined types were placed in this encounter.    Return in about 6 months (around 06/29/2021) for f/u exu ARMD OD, DFE, OCT.  There are no Patient Instructions on file for this visit.  This document serves as a record of services personally performed by Gardiner Sleeper, MD, PhD. It was created on their behalf by San Jetty. Owens Shark, OA an ophthalmic technician. The creation of this record is the provider's dictation and/or activities during the visit.    Electronically signed by: San Jetty. Owens Shark, [place] 12.16.2022 2:31 AM   Gardiner Sleeper, M.D., Ph.D. Diseases & Surgery of the Retina and Vitreous [facility]  I have reviewed the above documentation for accuracy and completeness, and I agree with the above. Gardiner Sleeper, M.D., Ph.D. 12/31/20 2:33 AM   Abbreviations: M myopia (nearsighted); A astigmatism; H hyperopia (farsighted); P  presbyopia; Mrx spectacle prescription;  CTL contact lenses; OD right eye; OS left eye; OU both eyes  XT exotropia; ET esotropia; PEK punctate epithelial keratitis; PEE punctate epithelial erosions; DES dry eye syndrome; MGD meibomian gland dysfunction; ATs artificial tears; PFAT's preservative free artificial tears; [place] nuclear sclerotic cataract; PSC posterior subcapsular cataract; ERM epi-retinal membrane; PVD posterior vitreous detachment; RD retinal detachment; DM diabetes mellitus; DR diabetic retinopathy; NPDR non-proliferative diabetic retinopathy; PDR proliferative diabetic retinopathy; CSME clinically significant macular edema; DME diabetic macular edema; dbh dot blot hemorrhages; CWS cotton wool spot; POAG primary open angle glaucoma; C/D cup-to-disc ratio; HVF humphrey visual field; GVF goldmann visual field; OCT optical coherence tomography; IOP intraocular pressure; BRVO Branch retinal vein occlusion; CRVO central retinal vein occlusion; CRAO central retinal artery occlusion; BRAO branch retinal artery occlusion; RT retinal tear; SB scleral buckle; PPV pars plana vitrectomy; VH Vitreous hemorrhage; PRP panretinal laser photocoagulation; IVK intravitreal kenalog; VMT vitreomacular traction; MH Macular hole;  NVD neovascularization of the disc; NVE neovascularization elsewhere; AREDS age related eye disease study; ARMD age related macular degeneration; POAG primary open angle glaucoma; EBMD epithelial/anterior basement membrane dystrophy; ACIOL anterior chamber  intraocular lens; IOL intraocular lens; PCIOL posterior chamber intraocular lens; Phaco/IOL phacoemulsification with intraocular lens placement; [place] photorefractive keratectomy; LASIK laser assisted in situ keratomileusis; HTN hypertension; DM diabetes mellitus; COPD chronic obstructive pulmonary disease

## 2020-12-31 ENCOUNTER — Encounter (INDEPENDENT_AMBULATORY_CARE_PROVIDER_SITE_OTHER): Payer: Self-pay | Admitting: Ophthalmology

## 2021-01-02 NOTE — Progress Notes (Deleted)
Cardiology Office Note  Date: 01/02/2021   ID: Cherry, Wittwer [birth year], MRN [id number]  PCP:  Monico Blitz, MD  Cardiologist:  Rozann Lesches, MD Electrophysiologist:  None   No chief complaint on file.   History of Present Illness: George [name] is a 75 y.o. male last seen in September.  Echocardiogram from June revealed LVEF 40 to 24%, mild diastolic dysfunction, normal RV contraction, and mildly dilated ascending aorta.  He did undergo a Myoview in October that did not reveal frank ischemia with calculated LVEF 48%.  Past Medical History:  Diagnosis Date   Ascending aortic aneurysm    4.1 cm in 2019   Chronic back pain    Coronary artery disease    Status post CABG 2014 (LIMA to LAD, SVG to ramus, SVG to OM 2)   Dermatophytosis of the body    Eczema    Essential hypertension    GERD (gastroesophageal reflux disease)    Herpes zoster without mention of complication    History of bronchitis    History of claustrophobia    HOH (hard of hearing)    Mixed hyperlipidemia    Prostate cancer ([place])    Psoriasis and similar disorder    Sinusitis    Type 2 diabetes mellitus ([place])     Past Surgical History:  Procedure Laterality Date   CATARACT EXTRACTION W/PHACO Left 04/04/2014   Procedure: CATARACT EXTRACTION PHACO AND INTRAOCULAR LENS PLACEMENT ([place]);  Surgeon: Williams Che, MD;  Location: [facility];  Service: Ophthalmology;  Laterality: Left;  CDE:4.49   CATARACT EXTRACTION W/PHACO Right 01/02/2015   Procedure: CATARACT EXTRACTION PHACO AND INTRAOCULAR LENS PLACEMENT; CDE:  6.36;  Surgeon: Williams Che, MD;  Location: [facility];  Service: Ophthalmology;  Laterality: Right;   CIRCUMCISION  2010   CORONARY ARTERY BYPASS GRAFT N/A 08/13/2012   Procedure: CORONARY ARTERY BYPASS GRAFTING (CABG);  Surgeon: Ivin Poot, MD;  Location: [place] [facility];  Service: Open Heart Surgery;  Laterality: N/A;   ENDOVEIN HARVEST OF GREATER SAPHENOUS VEIN Right 08/13/2012   Procedure:  ENDOVEIN HARVEST OF GREATER SAPHENOUS VEIN;  Surgeon: Ivin Poot, MD;  Location: [place];  Service: Open Heart Surgery;  Laterality: Right;   INTRAOPERATIVE TRANSESOPHAGEAL ECHOCARDIOGRAM N/A 08/13/2012   Procedure: INTRAOPERATIVE TRANSESOPHAGEAL ECHOCARDIOGRAM;  Surgeon: Ivin Poot, MD;  Location: [place];  Service: Open Heart Surgery;  Laterality: N/A;   LEFT HEART CATHETERIZATION WITH CORONARY ANGIOGRAM N/A 08/07/2012   Procedure: LEFT HEART CATHETERIZATION WITH CORONARY ANGIOGRAM;  Surgeon: Peter M Martinique, MD;  [facility];  Service: Cardiovascular;  Laterality: N/A;   RADIOACTIVE SEED IMPLANT N/A 11/25/2017   Procedure: RADIOACTIVE SEED IMPLANT/BRACHYTHERAPY IMPLANT;  Surgeon: Irine Seal, MD;  Location: [facility];  Service: Urology;  Laterality: N/A;   SEED IMPLANT DONE AT [place]  01/2017    Current Outpatient Medications  Medication Sig Dispense Refill   amLODipine (NORVASC) 10 MG tablet Take 1 tablet (10 mg total) by mouth daily. 30 tablet 6   atorvastatin (LIPITOR) 20 MG tablet Take 20 mg by mouth daily.     cloNIDine (CATAPRES) 0.1 MG tablet Take 1 tablet by mouth daily.     insulin glargine (LANTUS) 100 UNIT/ML Solostar Pen Inject 80 Units into the skin at bedtime.      metoprolol succinate (TOPROL XL) 50 MG 24 hr tablet Take 1 tablet (50 mg total) by mouth daily. 30 tablet 6   NOVOLOG FLEXPEN 100 UNIT/ML  FlexPen INJECT 25-31 UNITS INTO THE SKIN THREE TIMES DAILY BEFORE MEALS 30 mL 0   sacubitril-valsartan (ENTRESTO) 49-51 MG Take 1 tablet by mouth 2 (two) times daily. 60 tablet 6   No current facility-administered medications for this visit.   Allergies:  Patient has no known allergies.   Social History: The patient  reports that he quit smoking about 53 years ago. His smoking use included cigarettes. He started smoking about 55 years ago. He has a 4.00 pack-year smoking history. He has never used smokeless tobacco. He reports that he does not  drink alcohol and does not use drugs.   Family History: The patient's family history includes CAD in an other family member; Cataracts in his mother; Diabetes in an other family member; Heart attack in an other family member.   ROS:  Please see the history of present illness. Otherwise, complete review of systems is positive for {NONE DEFAULTED:18576}.  All other systems are reviewed and negative.   Physical Exam: VS:  There were no vitals taken for this visit., BMI There is no height or weight on file to calculate BMI.  Wt Readings from Last 3 Encounters:  10/16/20 237 lb (107.5 kg)  09/19/20 237 lb 9.6 oz (107.8 kg)  06/19/20 240 lb (108.9 kg)    General: Patient appears comfortable at rest. HEENT: Conjunctiva and lids normal, oropharynx clear with moist mucosa. Neck: Supple, no elevated JVP or carotid bruits, no thyromegaly. Lungs: Clear to auscultation, nonlabored breathing at rest. Cardiac: Regular rate and rhythm, no S3 or significant systolic murmur, no pericardial rub. Abdomen: Soft, nontender, no hepatomegaly, bowel sounds present, no guarding or rebound. Extremities: No pitting edema, distal pulses 2+. Skin: Warm and dry. Musculoskeletal: No kyphosis. Neuropsychiatric: Alert and oriented x3, affect grossly appropriate.  ECG:  An ECG dated 06/19/2020 was personally reviewed today and demonstrated:  Sinus rhythm with frequent PACs, left anterior fascicular block and decreased R wave progression with LVH.  Recent Labwork:  May 2022: Hemoglobin 14.5, platelets 195, TSH 1.98, cholesterol 191, triglycerides 602, HDL 34, LDL 65, AST 44, ALT 36, hemoglobin A1c 8.6% September 2022: Magnesium 1.6, potassium 4.3, BUN 15, creatinine 0.79  Other Studies Reviewed Today:  Echocardiogram 07/05/2020:  1. Inferior hypokinesis abnormal septal motion with hypokinesis . Left  ventricular ejection fraction, by estimation, is 40 to 45%. The left  ventricle has mildly decreased function. The left  ventricle demonstrates  regional wall motion abnormalities (see  scoring diagram/findings for description). The left ventricular internal  cavity size was mildly dilated. There is moderate asymmetric left  ventricular hypertrophy of the basal and septal segments. Left ventricular  diastolic parameters are consistent with   Grade I diastolic dysfunction (impaired relaxation).   2. Right ventricular systolic function is normal. The right ventricular  size is normal. There is normal pulmonary artery systolic pressure.   3. Left atrial size was moderately dilated.   4. The mitral valve is abnormal. Trivial mitral valve regurgitation. No  evidence of mitral stenosis.   5. The aortic valve is tricuspid. Aortic valve regurgitation is trivial.  Mild aortic valve sclerosis is present, with no evidence of aortic valve  stenosis.   6. Aortic dilatation noted. There is mild dilatation of the ascending  aorta, measuring 41 mm.   7. The inferior vena cava is normal in size with greater than 50%  respiratory variability, suggesting right atrial pressure of 3 mmHg.   Lexiscan Myoview 10/16/2020:   The study is normal. The study is  intermediate risk.   No ST deviation was noted.   Left ventricular function is abnormal. Global function is mildly reduced.   Prior study not available for comparison.   Findings: Negative for stress induced arrhythmias. Hypertensive at baseline. No evidence of ischemia or infarction. LV function is globally, mildly reduced.  LVEF 48%.   Conclusions: Stress test is negative. This is consistent with a moderate risk exam due to decreased ventricular function.  Assessment and Plan:    Medication Adjustments/Labs and Tests Ordered: Current medicines are reviewed at length with the patient today.  Concerns regarding medicines are outlined above.   Tests Ordered: No orders of the defined types were placed in this encounter.   Medication Changes: No orders of the  defined types were placed in this encounter.   Disposition:  Follow up {follow up:15908}  Signed, Satira Sark, MD, [facility] 01/02/2021 12:51 PM    [facility] at [place], [place], [place] [address] Phone: [phone]; Fax: [phone]

## 2021-01-03 ENCOUNTER — Ambulatory Visit: Payer: Self-pay | Admitting: Cardiology

## 2021-01-04 DIAGNOSIS — R6889 Other general symptoms and signs: Secondary | ICD-10-CM | POA: Diagnosis not present

## 2021-01-04 DIAGNOSIS — J019 Acute sinusitis, unspecified: Secondary | ICD-10-CM | POA: Diagnosis not present

## 2021-01-04 DIAGNOSIS — J329 Chronic sinusitis, unspecified: Secondary | ICD-10-CM | POA: Diagnosis not present

## 2021-01-04 DIAGNOSIS — Z6831 Body mass index (BMI) 31.0-31.9, adult: Secondary | ICD-10-CM | POA: Diagnosis not present

## 2021-01-04 DIAGNOSIS — Z299 Encounter for prophylactic measures, unspecified: Secondary | ICD-10-CM | POA: Diagnosis not present

## 2021-01-12 DIAGNOSIS — E119 Type 2 diabetes mellitus without complications: Secondary | ICD-10-CM | POA: Diagnosis not present

## 2021-02-11 DIAGNOSIS — E119 Type 2 diabetes mellitus without complications: Secondary | ICD-10-CM | POA: Diagnosis not present

## 2021-03-13 DIAGNOSIS — E119 Type 2 diabetes mellitus without complications: Secondary | ICD-10-CM | POA: Diagnosis not present

## 2021-03-27 DIAGNOSIS — L0390 Cellulitis, unspecified: Secondary | ICD-10-CM | POA: Diagnosis not present

## 2021-03-27 DIAGNOSIS — Z87891 Personal history of nicotine dependence: Secondary | ICD-10-CM | POA: Diagnosis not present

## 2021-03-27 DIAGNOSIS — I10 Essential (primary) hypertension: Secondary | ICD-10-CM | POA: Diagnosis not present

## 2021-03-27 DIAGNOSIS — E1165 Type 2 diabetes mellitus with hyperglycemia: Secondary | ICD-10-CM | POA: Diagnosis not present

## 2021-03-27 DIAGNOSIS — Z299 Encounter for prophylactic measures, unspecified: Secondary | ICD-10-CM | POA: Diagnosis not present

## 2021-03-27 DIAGNOSIS — R040 Epistaxis: Secondary | ICD-10-CM | POA: Diagnosis not present

## 2021-04-12 DIAGNOSIS — E119 Type 2 diabetes mellitus without complications: Secondary | ICD-10-CM | POA: Diagnosis not present

## 2021-04-30 DIAGNOSIS — I10 Essential (primary) hypertension: Secondary | ICD-10-CM | POA: Diagnosis not present

## 2021-04-30 DIAGNOSIS — Z299 Encounter for prophylactic measures, unspecified: Secondary | ICD-10-CM | POA: Diagnosis not present

## 2021-04-30 DIAGNOSIS — E1165 Type 2 diabetes mellitus with hyperglycemia: Secondary | ICD-10-CM | POA: Diagnosis not present

## 2021-04-30 DIAGNOSIS — Z87891 Personal history of nicotine dependence: Secondary | ICD-10-CM | POA: Diagnosis not present

## 2021-04-30 DIAGNOSIS — I509 Heart failure, unspecified: Secondary | ICD-10-CM | POA: Diagnosis not present

## 2021-05-13 DIAGNOSIS — E119 Type 2 diabetes mellitus without complications: Secondary | ICD-10-CM | POA: Diagnosis not present

## 2021-05-29 DIAGNOSIS — Z7189 Other specified counseling: Secondary | ICD-10-CM | POA: Diagnosis not present

## 2021-05-29 DIAGNOSIS — Z79899 Other long term (current) drug therapy: Secondary | ICD-10-CM | POA: Diagnosis not present

## 2021-05-29 DIAGNOSIS — R5383 Other fatigue: Secondary | ICD-10-CM | POA: Diagnosis not present

## 2021-05-29 DIAGNOSIS — Z0000 Encounter for general adult medical examination without abnormal findings: Secondary | ICD-10-CM | POA: Diagnosis not present

## 2021-05-29 DIAGNOSIS — I10 Essential (primary) hypertension: Secondary | ICD-10-CM | POA: Diagnosis not present

## 2021-05-29 DIAGNOSIS — Z299 Encounter for prophylactic measures, unspecified: Secondary | ICD-10-CM | POA: Diagnosis not present

## 2021-05-29 DIAGNOSIS — E7800 Pure hypercholesterolemia, unspecified: Secondary | ICD-10-CM | POA: Diagnosis not present

## 2021-06-12 DIAGNOSIS — E119 Type 2 diabetes mellitus without complications: Secondary | ICD-10-CM | POA: Diagnosis not present

## 2021-06-12 DIAGNOSIS — E7800 Pure hypercholesterolemia, unspecified: Secondary | ICD-10-CM | POA: Diagnosis not present

## 2021-06-12 DIAGNOSIS — Z79899 Other long term (current) drug therapy: Secondary | ICD-10-CM | POA: Diagnosis not present

## 2021-06-12 DIAGNOSIS — R5383 Other fatigue: Secondary | ICD-10-CM | POA: Diagnosis not present

## 2021-06-26 NOTE — Progress Notes (Shared)
[facility] Note  06/29/2021     CHIEF COMPLAINT Patient presents for No chief complaint on file.  HISTORY OF PRESENT ILLNESS: George [name] is a 76 y.o. male who presents to the clinic today for:    Pt states his vision is slowly getting worse, his left eye runs a lot, especially when he is in the cold  Referring physician: Monico Blitz, MD [place],  [place] [id number]  HISTORICAL INFORMATION:   Selected notes from the [place] Referred by Dr. Rocky Link for retinal hemorrhage LEE: 05.10.19 (R. [facility]) [BCVA: OD: 20/20 OS: 20/20] Ocular Hx-Pseudophakia PMH-DM(on Lantus and Metformin), HTN, Arthritis    CURRENT MEDICATIONS: No current outpatient medications on file. (Ophthalmic Drugs)   No current facility-administered medications for this visit. (Ophthalmic Drugs)   Current Outpatient Medications (Other)  Medication Sig   amLODipine (NORVASC) 10 MG tablet Take 1 tablet (10 mg total) by mouth daily.   atorvastatin (LIPITOR) 20 MG tablet Take 20 mg by mouth daily.   cloNIDine (CATAPRES) 0.1 MG tablet Take 1 tablet by mouth daily.   insulin glargine (LANTUS) 100 UNIT/ML Solostar Pen Inject 80 Units into the skin at bedtime.    metoprolol succinate (TOPROL XL) 50 MG 24 hr tablet Take 1 tablet (50 mg total) by mouth daily.   NOVOLOG FLEXPEN 100 UNIT/ML FlexPen INJECT 25-31 UNITS INTO THE SKIN THREE TIMES DAILY BEFORE MEALS   sacubitril-valsartan (ENTRESTO) 49-51 MG Take 1 tablet by mouth 2 (two) times daily.   No current facility-administered medications for this visit. (Other)   REVIEW OF SYSTEMS:   ALLERGIES No Known Allergies  PAST MEDICAL HISTORY Past Medical History:  Diagnosis Date   Ascending aortic aneurysm    4.1 cm in 2019   Chronic back pain    Coronary artery disease    Status post CABG 2014 (LIMA to LAD, SVG to ramus, SVG to OM 2)   Dermatophytosis of the body    Eczema    Essential hypertension     GERD (gastroesophageal reflux disease)    Herpes zoster without mention of complication    History of bronchitis    History of claustrophobia    HOH (hard of hearing)    Mixed hyperlipidemia    Prostate cancer ([place])    Psoriasis and similar disorder    Sinusitis    Type 2 diabetes mellitus ([place])    Past Surgical History:  Procedure Laterality Date   CATARACT EXTRACTION W/PHACO Left 04/04/2014   Procedure: CATARACT EXTRACTION PHACO AND INTRAOCULAR LENS PLACEMENT ([place]);  Surgeon: Williams Che, MD;  Location: [facility];  Service: Ophthalmology;  Laterality: Left;  CDE:4.49   CATARACT EXTRACTION W/PHACO Right 01/02/2015   Procedure: CATARACT EXTRACTION PHACO AND INTRAOCULAR LENS PLACEMENT; CDE:  6.36;  Surgeon: Williams Che, MD;  Location: [facility];  Service: Ophthalmology;  Laterality: Right;   CIRCUMCISION  2010   CORONARY ARTERY BYPASS GRAFT N/A 08/13/2012   Procedure: CORONARY ARTERY BYPASS GRAFTING (CABG);  Surgeon: Ivin Poot, MD;  Location: [place];  Service: Open Heart Surgery;  Laterality: N/A;   ENDOVEIN HARVEST OF GREATER SAPHENOUS VEIN Right 08/13/2012   Procedure: ENDOVEIN HARVEST OF GREATER SAPHENOUS VEIN;  Surgeon: Ivin Poot, MD;  Location: [place];  Service: Open Heart Surgery;  Laterality: Right;   INTRAOPERATIVE TRANSESOPHAGEAL ECHOCARDIOGRAM N/A 08/13/2012   Procedure: INTRAOPERATIVE TRANSESOPHAGEAL ECHOCARDIOGRAM;  Surgeon: Ivin Poot, MD;  Location: [place];  Service:  Open Heart Surgery;  Laterality: N/A;   LEFT HEART CATHETERIZATION WITH CORONARY ANGIOGRAM N/A 08/07/2012   Procedure: LEFT HEART CATHETERIZATION WITH CORONARY ANGIOGRAM;  Surgeon: Peter M Martinique, MD;  Location: [facility] CATH LAB;  Service: Cardiovascular;  Laterality: N/A;   RADIOACTIVE SEED IMPLANT N/A 11/25/2017   Procedure: RADIOACTIVE SEED IMPLANT/BRACHYTHERAPY IMPLANT;  Surgeon: Irine Seal, MD;  Location: [facility];  Service: Urology;  Laterality: N/A;   SEED IMPLANT DONE AT [facility]  01/2017    FAMILY HISTORY Family History  Problem Relation Age of Onset   CAD Other    Heart attack Other    Diabetes Other    Cataracts Mother    Amblyopia Neg Hx    Blindness Neg Hx    Glaucoma Neg Hx    Macular degeneration Neg Hx    Retinal detachment Neg Hx    Strabismus Neg Hx    Retinitis pigmentosa Neg Hx     SOCIAL HISTORY Social History   Tobacco Use   Smoking status: Former    Packs/day: 1.00    Years: 4.00    Total pack years: 4.00    Types: Cigarettes    Start date: 03/04/1965    Quit date: 03/05/1967    Years since quitting: 54.3   Smokeless tobacco: Never  Vaping Use   Vaping Use: Never used  Substance Use Topics   Alcohol use: No    Alcohol/week: 0.0 standard drinks of alcohol   Drug use: No       OPHTHALMIC EXAM:  Not recorded     IMAGING AND PROCEDURES  Imaging and Procedures for '@TODAY'$ @          ASSESSMENT/PLAN:    ICD-10-CM   1. Exudative age-related macular degeneration of right eye with active choroidal neovascularization ([place])  H35.3211     2. Intermediate stage nonexudative age-related macular degeneration of left eye  H35.3122     3. Nevus of choroid of left eye  D31.32     4. Posterior vitreous detachment of both eyes  H43.813     5. Pseudophakia of both eyes  Z96.1     6. PCO (posterior capsular opacification), bilateral  H26.493        1. History of Exudative age related macular degeneration, OD -- stable, inactive now  - peripapillary CNVM with +subretinal heme and edema, nasal disc -- stably improved today with no active SRF or heme  - S/P IVA OD #1 (05.15.19), #2 (07.01.19), #3 (08.13.19), #4 (09.30.19), #5 (01.15.20)  - OCT shows OD -- mild progression of ORA; focal ellipsoid loss inferior to fovea - stable  - BCVA stable 20/30 OD  - discussed findings and the import/ance of regular f/u and AREDS 2 supplements  - recommend holding off on IVA OD today - pt in agreement   - cont AREDS 2 supplements and  [place] grid monitoring  - F/U 6 months, sooner prn -- DFE, OCT  2. Age related macular degeneration, non-exudative, OS  - OCT shows interval increase in ORA/PED/SRHM OS  - recommend AREDS supplements and Amsler grid monitoring  - will continue to monitor  3. Choroidal nevus OS-  - flat pigmented lesion along sup temp arcade  - no suspcious features -- low concern for choroidal melanoma  - monitor   4. PVD / vitreous syneresis OU  - Discussed findings and prognosis  - No RT or RD on 360 exam  - Reviewed s/s of RT/RD  - Strict return precautions  for any such RT/RD signs/symptoms   5. Pseudophakia OU  - s/p CE/IOL OU by Dr. Iona Hansen in 2016  - doing well  - monitor   6. PCO OU -- OD>OS  - S/P YAG cap OD (05.29.19)  - looks great with PC nicely open OD  - OS not yet visually significant  - monitor   Ophthalmic Meds Ordered this visit:  No orders of the defined types were placed in this encounter.    No follow-ups on file.  There are no Patient Instructions on file for this visit.  This document serves as a record of services personally performed by Gardiner Sleeper, MD, PhD. It was created on their behalf by Leonie Douglas, an ophthalmic technician. The creation of this record is the provider's dictation and/or activities during the visit.    Electronically signed by: Leonie Douglas COA, 06/26/21  8:42 AM    Gardiner Sleeper, M.D., Ph.D. Diseases & Surgery of the Retina and Vitreous [facility]: M myopia (nearsighted); A astigmatism; H hyperopia (farsighted); P presbyopia; Mrx spectacle prescription;  CTL contact lenses; OD right eye; OS left eye; OU both eyes  XT exotropia; ET esotropia; PEK punctate epithelial keratitis; PEE punctate epithelial erosions; DES dry eye syndrome; MGD meibomian gland dysfunction; ATs artificial tears; PFAT's preservative free artificial tears; [place] nuclear sclerotic cataract; PSC posterior subcapsular cataract;  ERM epi-retinal membrane; PVD posterior vitreous detachment; RD retinal detachment; DM diabetes mellitus; DR diabetic retinopathy; NPDR non-proliferative diabetic retinopathy; PDR proliferative diabetic retinopathy; CSME clinically significant macular edema; DME diabetic macular edema; dbh dot blot hemorrhages; CWS cotton wool spot; POAG primary open angle glaucoma; C/D cup-to-disc ratio; HVF humphrey visual field; GVF goldmann visual field; OCT optical coherence tomography; IOP intraocular pressure; BRVO Branch retinal vein occlusion; CRVO central retinal vein occlusion; CRAO central retinal artery occlusion; BRAO branch retinal artery occlusion; RT retinal tear; SB scleral buckle; PPV pars plana vitrectomy; VH Vitreous hemorrhage; PRP panretinal laser photocoagulation; IVK intravitreal kenalog; VMT vitreomacular traction; MH Macular hole;  NVD neovascularization of the disc; NVE neovascularization elsewhere; AREDS age related eye disease study; ARMD age related macular degeneration; POAG primary open angle glaucoma; EBMD epithelial/anterior basement membrane dystrophy; ACIOL anterior chamber intraocular lens; IOL intraocular lens; PCIOL posterior chamber intraocular lens; Phaco/IOL phacoemulsification with intraocular lens placement; [place] photorefractive keratectomy; LASIK laser assisted in situ keratomileusis; HTN hypertension; DM diabetes mellitus; COPD chronic obstructive pulmonary disease

## 2021-06-27 DIAGNOSIS — E11622 Type 2 diabetes mellitus with other skin ulcer: Secondary | ICD-10-CM | POA: Diagnosis not present

## 2021-06-27 DIAGNOSIS — Z299 Encounter for prophylactic measures, unspecified: Secondary | ICD-10-CM | POA: Diagnosis not present

## 2021-06-27 DIAGNOSIS — L97809 Non-pressure chronic ulcer of other part of unspecified lower leg with unspecified severity: Secondary | ICD-10-CM | POA: Diagnosis not present

## 2021-06-27 DIAGNOSIS — I10 Essential (primary) hypertension: Secondary | ICD-10-CM | POA: Diagnosis not present

## 2021-06-27 DIAGNOSIS — E1165 Type 2 diabetes mellitus with hyperglycemia: Secondary | ICD-10-CM | POA: Diagnosis not present

## 2021-06-29 ENCOUNTER — Encounter (INDEPENDENT_AMBULATORY_CARE_PROVIDER_SITE_OTHER): Payer: Medicare Other | Admitting: Ophthalmology

## 2021-06-29 DIAGNOSIS — Z961 Presence of intraocular lens: Secondary | ICD-10-CM

## 2021-06-29 DIAGNOSIS — H353122 Nonexudative age-related macular degeneration, left eye, intermediate dry stage: Secondary | ICD-10-CM

## 2021-06-29 DIAGNOSIS — H353211 Exudative age-related macular degeneration, right eye, with active choroidal neovascularization: Secondary | ICD-10-CM

## 2021-06-29 DIAGNOSIS — D3132 Benign neoplasm of left choroid: Secondary | ICD-10-CM

## 2021-06-29 DIAGNOSIS — H43813 Vitreous degeneration, bilateral: Secondary | ICD-10-CM

## 2021-06-29 DIAGNOSIS — H26493 Other secondary cataract, bilateral: Secondary | ICD-10-CM

## 2021-07-10 DIAGNOSIS — Z299 Encounter for prophylactic measures, unspecified: Secondary | ICD-10-CM | POA: Diagnosis not present

## 2021-07-10 DIAGNOSIS — E1165 Type 2 diabetes mellitus with hyperglycemia: Secondary | ICD-10-CM | POA: Diagnosis not present

## 2021-07-10 DIAGNOSIS — I739 Peripheral vascular disease, unspecified: Secondary | ICD-10-CM | POA: Diagnosis not present

## 2021-07-10 DIAGNOSIS — I10 Essential (primary) hypertension: Secondary | ICD-10-CM | POA: Diagnosis not present

## 2021-07-12 DIAGNOSIS — E119 Type 2 diabetes mellitus without complications: Secondary | ICD-10-CM | POA: Diagnosis not present

## 2021-08-07 DIAGNOSIS — E119 Type 2 diabetes mellitus without complications: Secondary | ICD-10-CM | POA: Diagnosis not present

## 2021-08-13 DIAGNOSIS — E119 Type 2 diabetes mellitus without complications: Secondary | ICD-10-CM | POA: Diagnosis not present

## 2021-09-06 DIAGNOSIS — E119 Type 2 diabetes mellitus without complications: Secondary | ICD-10-CM | POA: Diagnosis not present

## 2021-09-10 DIAGNOSIS — Z299 Encounter for prophylactic measures, unspecified: Secondary | ICD-10-CM | POA: Diagnosis not present

## 2021-09-10 DIAGNOSIS — I509 Heart failure, unspecified: Secondary | ICD-10-CM | POA: Diagnosis not present

## 2021-09-10 DIAGNOSIS — M545 Low back pain, unspecified: Secondary | ICD-10-CM | POA: Diagnosis not present

## 2021-09-10 DIAGNOSIS — E1165 Type 2 diabetes mellitus with hyperglycemia: Secondary | ICD-10-CM | POA: Diagnosis not present

## 2021-09-10 DIAGNOSIS — I10 Essential (primary) hypertension: Secondary | ICD-10-CM | POA: Diagnosis not present

## 2021-09-12 DIAGNOSIS — E119 Type 2 diabetes mellitus without complications: Secondary | ICD-10-CM | POA: Diagnosis not present

## 2021-10-03 DIAGNOSIS — I10 Essential (primary) hypertension: Secondary | ICD-10-CM | POA: Diagnosis not present

## 2021-10-03 DIAGNOSIS — E1165 Type 2 diabetes mellitus with hyperglycemia: Secondary | ICD-10-CM | POA: Diagnosis not present

## 2021-10-03 DIAGNOSIS — Z299 Encounter for prophylactic measures, unspecified: Secondary | ICD-10-CM | POA: Diagnosis not present

## 2021-10-03 DIAGNOSIS — I509 Heart failure, unspecified: Secondary | ICD-10-CM | POA: Diagnosis not present

## 2021-10-03 DIAGNOSIS — Z23 Encounter for immunization: Secondary | ICD-10-CM | POA: Diagnosis not present

## 2021-10-03 DIAGNOSIS — R0602 Shortness of breath: Secondary | ICD-10-CM | POA: Diagnosis not present

## 2021-10-06 DIAGNOSIS — E119 Type 2 diabetes mellitus without complications: Secondary | ICD-10-CM | POA: Diagnosis not present

## 2021-10-12 DIAGNOSIS — E119 Type 2 diabetes mellitus without complications: Secondary | ICD-10-CM | POA: Diagnosis not present

## 2021-11-02 DIAGNOSIS — Z299 Encounter for prophylactic measures, unspecified: Secondary | ICD-10-CM | POA: Diagnosis not present

## 2021-11-02 DIAGNOSIS — E1165 Type 2 diabetes mellitus with hyperglycemia: Secondary | ICD-10-CM | POA: Diagnosis not present

## 2021-11-02 DIAGNOSIS — R350 Frequency of micturition: Secondary | ICD-10-CM | POA: Diagnosis not present

## 2021-11-02 DIAGNOSIS — I10 Essential (primary) hypertension: Secondary | ICD-10-CM | POA: Diagnosis not present

## 2021-11-02 DIAGNOSIS — M545 Low back pain, unspecified: Secondary | ICD-10-CM | POA: Diagnosis not present

## 2021-11-07 DIAGNOSIS — M545 Low back pain, unspecified: Secondary | ICD-10-CM | POA: Diagnosis not present

## 2021-11-12 DIAGNOSIS — E119 Type 2 diabetes mellitus without complications: Secondary | ICD-10-CM | POA: Diagnosis not present

## 2021-11-13 DIAGNOSIS — I509 Heart failure, unspecified: Secondary | ICD-10-CM | POA: Diagnosis not present

## 2021-11-13 DIAGNOSIS — E1165 Type 2 diabetes mellitus with hyperglycemia: Secondary | ICD-10-CM | POA: Diagnosis not present

## 2021-11-13 DIAGNOSIS — I10 Essential (primary) hypertension: Secondary | ICD-10-CM | POA: Diagnosis not present

## 2021-11-13 DIAGNOSIS — M545 Low back pain, unspecified: Secondary | ICD-10-CM | POA: Diagnosis not present

## 2021-11-13 DIAGNOSIS — Z299 Encounter for prophylactic measures, unspecified: Secondary | ICD-10-CM | POA: Diagnosis not present

## 2021-11-27 DIAGNOSIS — Z760 Encounter for issue of repeat prescription: Secondary | ICD-10-CM | POA: Diagnosis not present

## 2021-11-28 DIAGNOSIS — E119 Type 2 diabetes mellitus without complications: Secondary | ICD-10-CM | POA: Diagnosis not present

## 2021-12-12 DIAGNOSIS — E119 Type 2 diabetes mellitus without complications: Secondary | ICD-10-CM | POA: Diagnosis not present

## 2021-12-28 DIAGNOSIS — E119 Type 2 diabetes mellitus without complications: Secondary | ICD-10-CM | POA: Diagnosis not present

## 2022-01-09 DIAGNOSIS — E7800 Pure hypercholesterolemia, unspecified: Secondary | ICD-10-CM | POA: Diagnosis not present

## 2022-01-09 DIAGNOSIS — I10 Essential (primary) hypertension: Secondary | ICD-10-CM | POA: Diagnosis not present

## 2022-01-09 DIAGNOSIS — I509 Heart failure, unspecified: Secondary | ICD-10-CM | POA: Diagnosis not present

## 2022-01-09 DIAGNOSIS — E1165 Type 2 diabetes mellitus with hyperglycemia: Secondary | ICD-10-CM | POA: Diagnosis not present

## 2022-01-09 DIAGNOSIS — Z299 Encounter for prophylactic measures, unspecified: Secondary | ICD-10-CM | POA: Diagnosis not present

## 2022-01-09 DIAGNOSIS — B353 Tinea pedis: Secondary | ICD-10-CM | POA: Diagnosis not present

## 2022-01-27 DIAGNOSIS — E119 Type 2 diabetes mellitus without complications: Secondary | ICD-10-CM | POA: Diagnosis not present

## 2022-02-11 DIAGNOSIS — E119 Type 2 diabetes mellitus without complications: Secondary | ICD-10-CM | POA: Diagnosis not present

## 2022-03-06 DIAGNOSIS — E119 Type 2 diabetes mellitus without complications: Secondary | ICD-10-CM | POA: Diagnosis not present

## 2022-03-13 DIAGNOSIS — E119 Type 2 diabetes mellitus without complications: Secondary | ICD-10-CM | POA: Diagnosis not present

## 2022-03-25 DIAGNOSIS — I509 Heart failure, unspecified: Secondary | ICD-10-CM | POA: Diagnosis not present

## 2022-03-25 DIAGNOSIS — Z299 Encounter for prophylactic measures, unspecified: Secondary | ICD-10-CM | POA: Diagnosis not present

## 2022-03-25 DIAGNOSIS — R509 Fever, unspecified: Secondary | ICD-10-CM | POA: Diagnosis not present

## 2022-03-25 DIAGNOSIS — J019 Acute sinusitis, unspecified: Secondary | ICD-10-CM | POA: Diagnosis not present

## 2022-04-05 DIAGNOSIS — E119 Type 2 diabetes mellitus without complications: Secondary | ICD-10-CM | POA: Diagnosis not present

## 2022-04-11 DIAGNOSIS — I509 Heart failure, unspecified: Secondary | ICD-10-CM | POA: Diagnosis not present

## 2022-04-11 DIAGNOSIS — Z0000 Encounter for general adult medical examination without abnormal findings: Secondary | ICD-10-CM | POA: Diagnosis not present

## 2022-04-11 DIAGNOSIS — I10 Essential (primary) hypertension: Secondary | ICD-10-CM | POA: Diagnosis not present

## 2022-04-11 DIAGNOSIS — Z299 Encounter for prophylactic measures, unspecified: Secondary | ICD-10-CM | POA: Diagnosis not present

## 2022-04-11 DIAGNOSIS — Z7189 Other specified counseling: Secondary | ICD-10-CM | POA: Diagnosis not present

## 2022-04-11 DIAGNOSIS — I700 Atherosclerosis of aorta: Secondary | ICD-10-CM | POA: Diagnosis not present

## 2022-04-11 DIAGNOSIS — I739 Peripheral vascular disease, unspecified: Secondary | ICD-10-CM | POA: Diagnosis not present

## 2022-04-13 DIAGNOSIS — E119 Type 2 diabetes mellitus without complications: Secondary | ICD-10-CM | POA: Diagnosis not present

## 2022-04-15 DIAGNOSIS — I2510 Atherosclerotic heart disease of native coronary artery without angina pectoris: Secondary | ICD-10-CM | POA: Diagnosis not present

## 2022-04-15 DIAGNOSIS — Z79899 Other long term (current) drug therapy: Secondary | ICD-10-CM | POA: Diagnosis not present

## 2022-04-15 DIAGNOSIS — R5383 Other fatigue: Secondary | ICD-10-CM | POA: Diagnosis not present

## 2022-04-15 DIAGNOSIS — E7800 Pure hypercholesterolemia, unspecified: Secondary | ICD-10-CM | POA: Diagnosis not present

## 2022-04-24 ENCOUNTER — Telehealth: Payer: Self-pay | Admitting: Cardiology

## 2022-04-24 MED ORDER — NITROGLYCERIN 0.4 MG SL SUBL: 0.4000 mg | SUBLINGUAL_TABLET | SUBLINGUAL | 3 refills | Status: AC | PRN

## 2022-04-24 NOTE — Telephone Encounter (Signed)
Reports left arm and chest pain with activity, rated 8/10 when it occurs. Also reports SOB after walking short distances. Reports symptoms started 2 months ago and have gradually gotten worse. Denies active chest pain or SOB. Does not have nitroglycerin on hand and is requesting refill. Gave first available appointment to see Philis Nettle 04/30/2022 @1 :30 pm. Advised if symptoms get worse, to go to ED for an evaluation. Verbalized understanding.

## 2022-04-24 NOTE — Telephone Encounter (Signed)
Patient informed and verbalized understanding of plan. 

## 2022-04-24 NOTE — Telephone Encounter (Signed)
Pt c/o Shortness Of Breath: STAT if SOB developed within the last 24 hours or pt is noticeably SOB on the phone  1. Are you currently SOB (can you hear that pt is SOB on the phone)? Spoke with his wife.   2. How long have you been experiencing SOB? Wife states he has been having the SOB for threes months.  3. Are you SOB when sitting or when up moving around? SOB when up moving around  4. Are you currently experiencing any other symptoms? Left arm hurts him.

## 2022-04-30 ENCOUNTER — Ambulatory Visit: Payer: Medicare HMO | Admitting: Nurse Practitioner

## 2022-04-30 NOTE — Progress Notes (Deleted)
Office Visit    Patient Name: George [name] Date of Encounter: 04/30/2022  PCP:  Kirstie Peri, MD   [facility]  Cardiologist:  Nona Dell, MD *** Advanced Practice Provider:  No care team member to display Electrophysiologist:  None  {Press F2 to show EP APP, CHF, sleep or structural heart MD               :[id number]}  { Click here to update then REFRESH NOTE - MD (PCP) or APP (Team Member)  Change PCP Type for MD, Specialty for APP is either Cardiology or Clinical Cardiac Electrophysiology  :[id number]}  Chief Complaint    George [name] is a 77 y.o. male with a hx of CAD, s/p CABG in 2014 (LIMA-LAD, SVG-ramus, SVG-OM 2), mixed hyperlipidemia, HFmrEF, type 2 diabetes, hypertension, GERD, ascending aortic aneurysm, and chronic back pain, who presents today for chest pain evaluation.  Past Medical History    Past Medical History:  Diagnosis Date   Ascending aortic aneurysm    4.1 cm in 2019   Chronic back pain    Coronary artery disease    Status post CABG 2014 (LIMA to LAD, SVG to ramus, SVG to OM 2)   Dermatophytosis of the body    Eczema    Essential hypertension    GERD (gastroesophageal reflux disease)    Herpes zoster without mention of complication    History of bronchitis    History of claustrophobia    HOH (hard of hearing)    Mixed hyperlipidemia    Prostate cancer (HCC)    Psoriasis and similar disorder    Sinusitis    Type 2 diabetes mellitus (HCC)    Past Surgical History:  Procedure Laterality Date   CATARACT EXTRACTION W/PHACO Left 04/04/2014   Procedure: CATARACT EXTRACTION PHACO AND INTRAOCULAR LENS PLACEMENT (IOC);  Surgeon: Susa Simmonds, MD;  Location: [facility];  Service: Ophthalmology;  Laterality: Left;  CDE:4.49   CATARACT EXTRACTION W/PHACO Right 01/02/2015   Procedure: CATARACT EXTRACTION PHACO AND INTRAOCULAR LENS PLACEMENT; CDE:  6.36;  Surgeon: Susa Simmonds, MD;  Location: [facility];  Service: Ophthalmology;   Laterality: Right;   CIRCUMCISION  2010   CORONARY ARTERY BYPASS GRAFT N/A 08/13/2012   Procedure: CORONARY ARTERY BYPASS GRAFTING (CABG);  Surgeon: Kerin Perna, MD;  Location: [facility] OR;  Service: Open Heart Surgery;  Laterality: N/A;   ENDOVEIN HARVEST OF GREATER SAPHENOUS VEIN Right 08/13/2012   Procedure: ENDOVEIN HARVEST OF GREATER SAPHENOUS VEIN;  Surgeon: Kerin Perna, MD;  Location: [facility] OR;  Service: Open Heart Surgery;  Laterality: Right;   INTRAOPERATIVE TRANSESOPHAGEAL ECHOCARDIOGRAM N/A 08/13/2012   Procedure: INTRAOPERATIVE TRANSESOPHAGEAL ECHOCARDIOGRAM;  Surgeon: Kerin Perna, MD;  Location: [facility] OR;  Service: Open Heart Surgery;  Laterality: N/A;   LEFT HEART CATHETERIZATION WITH CORONARY ANGIOGRAM N/A 08/07/2012   Procedure: LEFT HEART CATHETERIZATION WITH CORONARY ANGIOGRAM;  Surgeon: Peter M Swaziland, MD;  Location: [facility] CATH LAB;  Service: Cardiovascular;  Laterality: N/A;   RADIOACTIVE SEED IMPLANT N/A 11/25/2017   Procedure: RADIOACTIVE SEED IMPLANT/BRACHYTHERAPY IMPLANT;  Surgeon: Bjorn Pippin, MD;  Location: [facility];  Service: Urology;  Laterality: N/A;   SEED IMPLANT DONE AT [place]  01/2017    Allergies  No Known Allergies  History of Present Illness    George [name] is a 77 y.o. male with a PMH as mentioned above.  Last seen by Dr. Diona Browner on June 19, 2020.  At that time, patient was noting more shortness of breath and fatigue with activity over the past several months.  Denied any chest pain.  Echo revealed EF 40 to 45%, full report below, was started on Entresto.    Seen by Rennis Harding, NP in September 2022 for follow-up. Was unable to perform Lexiscan Myoview due to history of claustrophobia.  Stress test arranged was deemed to be normal, however however was considered moderate risk due to decreased ventricular function, no evidence of ischemia noted.    He recently contacted our office noting left arm/chest pain with activity.  Noted  shortness of breath after walking short distances, symptoms started 2 months ago and have gradually gotten worse over time per his report.  Today he presents for evaluation.  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  The ekg ordered today demonstrates ***  Recent Labs: No results found for requested labs within last 365 days.  Recent Lipid Panel    Component Value Date/Time   CHOL 103 04/23/2017 0000   TRIG 123 04/23/2017 0000   HDL 34 (A) 04/23/2017 0000   LDLCALC 44 04/23/2017 0000    Risk Assessment/Calculations:  {Does this patient have ATRIAL FIBRILLATION?[phone]}  Home Medications   No outpatient medications have been marked as taking for the 04/30/22 encounter (Appointment) with Sharlene Dory, NP.     Review of Systems   ***   All other systems reviewed and are otherwise negative except as noted above.  Physical Exam    VS:  There were no vitals taken for this visit. , BMI There is no height or weight on file to calculate BMI.  Wt Readings from Last 3 Encounters:  10/16/20 237 lb (107.5 kg)  09/19/20 237 lb 9.6 oz (107.8 kg)  06/19/20 240 lb (108.9 kg)     GEN: Well nourished, well developed, in no acute distress. HEENT: normal. Neck: Supple, no JVD, carotid bruits, or masses. Cardiac: ***RRR, no murmurs, rubs, or gallops. No clubbing, cyanosis, edema.  ***Radials/PT 2+ and equal bilaterally.  Respiratory:  ***Respirations regular and unlabored, clear to auscultation bilaterally. GI: Soft, nontender, nondistended. MS: No deformity or atrophy. Skin: Warm and dry, no rash. Neuro:  Strength and sensation are intact. Psych: Normal affect.  Assessment & Plan    ***  {Are you ordering a CV Procedure (e.g. stress test, cath, DCCV, TEE, etc)?   Press F2        :[id number]}      Disposition: Follow up {follow up:15908} with Nona Dell, MD  or APP.  Signed, Sharlene Dory, NP 04/30/2022, 8:37 AM [facility]

## 2022-05-02 ENCOUNTER — Ambulatory Visit: Payer: Medicare Other | Attending: Nurse Practitioner | Admitting: Nurse Practitioner

## 2022-05-02 ENCOUNTER — Encounter: Payer: Self-pay | Admitting: Nurse Practitioner

## 2022-05-02 VITALS — BP 148/72 | HR 68 | Ht 76.0 in | Wt 239.6 lb

## 2022-05-02 DIAGNOSIS — I7121 Aneurysm of the ascending aorta, without rupture: Secondary | ICD-10-CM | POA: Diagnosis not present

## 2022-05-02 DIAGNOSIS — I25118 Atherosclerotic heart disease of native coronary artery with other forms of angina pectoris: Secondary | ICD-10-CM | POA: Diagnosis not present

## 2022-05-02 DIAGNOSIS — I5022 Chronic systolic (congestive) heart failure: Secondary | ICD-10-CM

## 2022-05-02 DIAGNOSIS — E782 Mixed hyperlipidemia: Secondary | ICD-10-CM | POA: Diagnosis not present

## 2022-05-02 DIAGNOSIS — I10 Essential (primary) hypertension: Secondary | ICD-10-CM | POA: Diagnosis not present

## 2022-05-02 DIAGNOSIS — R0609 Other forms of dyspnea: Secondary | ICD-10-CM | POA: Diagnosis not present

## 2022-05-02 DIAGNOSIS — I209 Angina pectoris, unspecified: Secondary | ICD-10-CM

## 2022-05-02 MED ORDER — CARVEDILOL 6.25 MG PO TABS: 6.2500 mg | ORAL_TABLET | Freq: Two times a day (BID) | ORAL | 1 refills | Status: DC

## 2022-05-02 NOTE — Patient Instructions (Signed)
Medication Instructions:  Your physician has recommended you make the following change in your medication:  STOP metoprolol START coreg 6.25 mg twice a day Continue all other medications as directed  Labwork: none  Testing/Procedures: Your physician has requested that you have a lexiscan myoview. For further information please visit [email or web address] Please follow instruction sheet, as given.  Your physician has requested that you have an echocardiogram. Echocardiography is a painless test that uses sound waves to create images of your heart. It provides your doctor with information about the size and shape of your heart and how well your heart's chambers and valves are working. This procedure takes approximately one hour. There are no restrictions for this procedure. Please do NOT wear cologne, perfume, aftershave, or lotions (deodorant is allowed). Please arrive 15 minutes prior to your appointment time.   Follow-Up:  Your physician recommends that you schedule a follow-up appointment in:4-6 weeks  Any Other Special Instructions Will Be Listed Below (If Applicable).  If you need a refill on your cardiac medications before your next appointment, please call your pharmacy.

## 2022-05-02 NOTE — Progress Notes (Signed)
Office Visit    Patient Name: George [name] Date of Encounter: 05/02/2022  PCP:  Kirstie Peri, MD   [facility]  Cardiologist:  Nona Dell, MD  Advanced Practice Provider:  No care team member to display Electrophysiologist:  None   Chief Complaint    George [name] is a 77 y.o. male with a hx of CAD, s/p CABG in 2014 (LIMA-LAD, SVG-ramus, SVG-OM 2), mixed hyperlipidemia, HFmrEF, type 2 diabetes, hypertension, GERD, ascending aortic aneurysm, and chronic back pain, who presents today for chest pain evaluation.  Past Medical History    Past Medical History:  Diagnosis Date   Ascending aortic aneurysm    4.1 cm in 2019   Chronic back pain    Coronary artery disease    Status post CABG 2014 (LIMA to LAD, SVG to ramus, SVG to OM 2)   Dermatophytosis of the body    Eczema    Essential hypertension    GERD (gastroesophageal reflux disease)    Herpes zoster without mention of complication    History of bronchitis    History of claustrophobia    HOH (hard of hearing)    Mixed hyperlipidemia    Prostate cancer    Psoriasis and similar disorder    Sinusitis    Type 2 diabetes mellitus    Past Surgical History:  Procedure Laterality Date   CATARACT EXTRACTION W/PHACO Left 04/04/2014   Procedure: CATARACT EXTRACTION PHACO AND INTRAOCULAR LENS PLACEMENT (IOC);  Surgeon: Susa Simmonds, MD;  Location: [facility];  Service: Ophthalmology;  Laterality: Left;  CDE:4.49   CATARACT EXTRACTION W/PHACO Right 01/02/2015   Procedure: CATARACT EXTRACTION PHACO AND INTRAOCULAR LENS PLACEMENT; CDE:  6.36;  Surgeon: Susa Simmonds, MD;  Location: [facility];  Service: Ophthalmology;  Laterality: Right;   CIRCUMCISION  2010   CORONARY ARTERY BYPASS GRAFT N/A 08/13/2012   Procedure: CORONARY ARTERY BYPASS GRAFTING (CABG);  Surgeon: Kerin Perna, MD;  Location: [facility] OR;  Service: Open Heart Surgery;  Laterality: N/A;   ENDOVEIN HARVEST OF GREATER SAPHENOUS VEIN Right  08/13/2012   Procedure: ENDOVEIN HARVEST OF GREATER SAPHENOUS VEIN;  Surgeon: Kerin Perna, MD;  Location: [facility] OR;  Service: Open Heart Surgery;  Laterality: Right;   INTRAOPERATIVE TRANSESOPHAGEAL ECHOCARDIOGRAM N/A 08/13/2012   Procedure: INTRAOPERATIVE TRANSESOPHAGEAL ECHOCARDIOGRAM;  Surgeon: Kerin Perna, MD;  Location: [facility] OR;  Service: Open Heart Surgery;  Laterality: N/A;   LEFT HEART CATHETERIZATION WITH CORONARY ANGIOGRAM N/A 08/07/2012   Procedure: LEFT HEART CATHETERIZATION WITH CORONARY ANGIOGRAM;  Surgeon: Peter M Swaziland, MD;  Location: [facility] CATH LAB;  Service: Cardiovascular;  Laterality: N/A;   RADIOACTIVE SEED IMPLANT N/A 11/25/2017   Procedure: RADIOACTIVE SEED IMPLANT/BRACHYTHERAPY IMPLANT;  Surgeon: Bjorn Pippin, MD;  Location: [facility];  Service: Urology;  Laterality: N/A;   SEED IMPLANT DONE AT [place]  01/2017    Allergies  No Known Allergies  History of Present Illness    George [name] is a 77 y.o. male with a PMH as mentioned above.  Last seen by Dr. Diona Browner on June 19, 2020.  At that time, patient was noting more shortness of breath and fatigue with activity over the past several months.  Denied any chest pain.  Echo revealed EF 40 to 45%, full report below, was started on Entresto.    Seen by Rennis Harding, NP in September 2022 for follow-up. Was unable to perform Lexiscan Myoview due to history of claustrophobia.  Stress test arranged was deemed to be normal, however however was considered moderate risk due to decreased ventricular function, no evidence of ischemia noted.    He recently contacted our office noting left arm/chest pain with activity.  Noted shortness of breath after walking short distances, symptoms started 2 months ago and have gradually gotten worse over time per his report.  Today he presents for evaluation.  He states for the past few months, has noticed chest discomfort when doing strenuous activities/L arm pain, has gotten a  little worse recently and decided to get checked out. Located in the middle of his chest, non-radiating. Unable to walk fast or walk uphill, also endorses [facility] with exertion. Has to sit down for relief of symptoms, symptoms are worse when continuing to exert himself.  5-6/10 in intensity. When reviewing meds with patient and wife, not taking Entresto for about the past year, was stopped by PCP. Denies any palpitations, syncope, presyncope, dizziness, orthopnea, PND, swelling or significant weight changes, acute bleeding, or claudication.  SH: Works as a Financial controller Studies Reviewed:   The following studies were reviewed today:   EKG:  EKG is ordered today.  The ekg ordered today demonstrates SR, 78 bpm, LBBB, no acute ischemic changes.   Myoview 10/2020:   The study is normal. The study is intermediate risk.   No ST deviation was noted.   Left ventricular function is abnormal. Global function is mildly reduced.   Prior study not available for comparison.   Findings: Negative for stress induced arrhythmias. Hypertensive at baseline. No evidence of ischemia or infarction. LV function is globally, mildly reduced.  LVEF 48%.   Conclusions: Stress test is negative. This is consistent with a moderate risk exam due to decreased ventricular function.   Echo 06/2020:   1. Inferior hypokinesis abnormal septal motion with hypokinesis . Left  ventricular ejection fraction, by estimation, is 40 to 45%. The left  ventricle has mildly decreased function. The left ventricle demonstrates  regional wall motion abnormalities (see  scoring diagram/findings for description). The left ventricular internal  cavity size was mildly dilated. There is moderate asymmetric left  ventricular hypertrophy of the basal and septal segments. Left ventricular  diastolic parameters are consistent with   Grade I diastolic dysfunction (impaired relaxation).   2. Right ventricular systolic function is  normal. The right ventricular  size is normal. There is normal pulmonary artery systolic pressure.   3. Left atrial size was moderately dilated.   4. The mitral valve is abnormal. Trivial mitral valve regurgitation. No  evidence of mitral stenosis.   5. The aortic valve is tricuspid. Aortic valve regurgitation is trivial.  Mild aortic valve sclerosis is present, with no evidence of aortic valve  stenosis.   6. Aortic dilatation noted. There is mild dilatation of the ascending  aorta, measuring 41 mm.   7. The inferior vena cava is normal in size with greater than 50%  respiratory variability, suggesting right atrial pressure of 3 mmHg.  Recent Labs: No results found for requested labs within last 365 days.  Recent Lipid Panel    Component Value Date/Time   CHOL 103 04/23/2017 0000   TRIG 123 04/23/2017 0000   HDL 34 (A) 04/23/2017 0000   LDLCALC 44 04/23/2017 0000    Home Medications   Current Meds  Medication Sig   amLODipine (NORVASC) 10 MG tablet Take 1 tablet (10 mg total) by mouth daily.   atorvastatin (LIPITOR) 20 MG tablet Take 20 mg  by mouth daily.   cloNIDine (CATAPRES) 0.1 MG tablet Take 1 tablet by mouth daily.   insulin glargine (LANTUS) 100 UNIT/ML Solostar Pen Inject 70 Units into the skin at bedtime.   nitroGLYCERIN (NITROSTAT) 0.4 MG SL tablet Place 1 tablet (0.4 mg total) under the tongue every 5 (five) minutes x 3 doses as needed for chest pain (if no relief after 3rd dose, proceed to ED for evaluation or call 911).   NOVOLOG FLEXPEN 100 UNIT/ML FlexPen INJECT 25-31 UNITS INTO THE SKIN THREE TIMES DAILY BEFORE MEALS (Patient taking differently: Inject 50 Units into the skin 3 (three) times daily with meals.)   metoprolol succinate (TOPROL XL) 50 MG 24 hr tablet Take 1 tablet (50 mg total) by mouth daily.     Review of Systems    All other systems reviewed and are otherwise negative except as noted above.  Physical Exam    VS:  BP (!) 148/72 (BP Location:  Right Arm, Patient Position: Sitting, Cuff Size: Large)   Pulse 68   Ht 6\' 4"  (1.93 m)   Wt 239 lb 9.6 oz (108.7 kg)   SpO2 95%   BMI 29.17 kg/m  , BMI Body mass index is 29.17 kg/m.  Wt Readings from Last 3 Encounters:  05/02/22 239 lb 9.6 oz (108.7 kg)  10/16/20 237 lb (107.5 kg)  09/19/20 237 lb 9.6 oz (107.8 kg)     GEN: Well nourished, well developed, 77 y.o. male in no acute distress. HEENT: normal. Neck: Supple, no JVD, carotid bruits, or masses. Cardiac: S1/S2, RRR, no murmurs, rubs, or gallops. No clubbing, cyanosis, edema.  Radials/PT 2+ and equal bilaterally.  Respiratory:  Respirations regular and unlabored, clear to auscultation bilaterally. MS: No deformity or atrophy. Skin: Warm and dry, no rash. Neuro:  Strength and sensation are intact. Psych: Normal affect.  Assessment & Plan    Angina pectoris / CAD, s/p CABG, DOE Ongoing symptoms with exertion x several months, little worse recently. Myoview in 2022 negative for ischemia. EKG today negative for acute ischemic changes. Discussed several options for ischemic evaluation and patient is agreeable with sit up GXT, as he cannot lay down for test d/t hx of claustrophobia. Will also update Echo. Continue atorvastatin and NTG PRN. Stopping Metoprolol and switching to Coreg for better BP control. Heart healthy diet encouraged. ED precautions discussed.  Shared Decision Making/Informed Consent The risks [chest pain, shortness of breath, cardiac arrhythmias, dizziness, blood pressure fluctuations, myocardial infarction, stroke/transient ischemic attack, nausea, vomiting, allergic reaction, radiation exposure, metallic taste sensation and life-threatening complications (estimated to be 1 in 10,000)], benefits (risk stratification, diagnosing coronary artery disease, treatment guidance) and alternatives of a nuclear stress test were discussed in detail with Mr. Mcshan and he agrees to proceed.    2. HFmrEF Echo 06/2020 revealed EF  40-45, RWMA, moderate asymmetric LVH, grade 1 DD. Was started on Entresto based on these findings, no longer on Entresto per wife's report. Updating Echo as mentioned above. Euvolemic and well compensated on exam. Switching Metoprolol to Coreg, may need to re-start Entresto based on Echo findings if necessary. Low sodium diet, fluid restriction <2L, and daily weights encouraged. Educated to contact our office for weight gain of 2 lbs overnight or 5 lbs in one week.  3. Mixed HLD No recent lipid panel on file. At next OV, will request labs from PCP, because managed by PCP. Continue Lipitor. Heart healthy diet encouraged.   4. HTN BP elevated today. Will stop metoprolol and switch  to Coreg. Continue amlodipine and depending on Echo results, may need to restart Entresto. Discussed to monitor BP at home at least 2 hours after medications and sitting for 5-10 minutes. Heart healthy diet encouraged.   5. Ascending aortic aneurysm Mild dilatation noted of ascending aorta on Echo in 2022 noted, measuring 41 mm. Updating Echo as mentioned above. Care precautions and ED precautions discussed. Heart healthy diet encouraged.   Disposition: Follow up in 4-6 week(s) with Nona Dell, MD or APP.  Signed, Sharlene Dory, NP 05/04/2022, 9:50 PM [facility]

## 2022-05-03 ENCOUNTER — Telehealth (HOSPITAL_COMMUNITY): Payer: Self-pay | Admitting: *Deleted

## 2022-05-03 NOTE — Telephone Encounter (Signed)
Spoke with pt's wife Carletta, and gave detailed instructions for MPI study scheduled on 05/08/22.

## 2022-05-05 DIAGNOSIS — E119 Type 2 diabetes mellitus without complications: Secondary | ICD-10-CM | POA: Diagnosis not present

## 2022-05-08 ENCOUNTER — Ambulatory Visit (HOSPITAL_COMMUNITY): Payer: Medicare Other | Attending: Nurse Practitioner

## 2022-05-08 DIAGNOSIS — I209 Angina pectoris, unspecified: Secondary | ICD-10-CM | POA: Diagnosis not present

## 2022-05-08 LAB — MYOCARDIAL PERFUSION IMAGING
LV dias vol: 207 mL (ref 62–150)
LV sys vol: 128 mL
Nuc Stress EF: 38 %
Peak HR: 86 {beats}/min
Rest HR: 71 {beats}/min
Rest Nuclear Isotope Dose: 10.6 mCi
SDS: 3
SRS: 3
SSS: 7
ST Depression (mm): 0 mm
Stress Nuclear Isotope Dose: 32.8 mCi
TID: 1.08

## 2022-05-08 MED ORDER — REGADENOSON 0.4 MG/5ML IV SOLN
0.4000 mg | Freq: Once | INTRAVENOUS | Status: AC
Start: 2022-05-08 — End: ?

## 2022-05-08 MED ORDER — TECHNETIUM TC 99M TETROFOSMIN IV KIT
10.6000 | PACK | Freq: Once | INTRAVENOUS | Status: AC | PRN
  Administered 2022-05-08: 10.6 via INTRAVENOUS

## 2022-05-08 MED ORDER — TECHNETIUM TC 99M TETROFOSMIN IV KIT: 32.8000 | PACK | Freq: Once | INTRAVENOUS | Status: AC | PRN

## 2022-05-24 ENCOUNTER — Ambulatory Visit: Payer: Self-pay | Admitting: Nurse Practitioner

## 2022-05-28 ENCOUNTER — Ambulatory Visit: Payer: Medicare Other | Attending: Nurse Practitioner

## 2022-05-28 DIAGNOSIS — R0609 Other forms of dyspnea: Secondary | ICD-10-CM

## 2022-05-28 MED ORDER — PERFLUTREN LIPID MICROSPHERE
1.0000 mL | INTRAVENOUS | Status: AC | PRN
Start: 2022-05-28 — End: 2022-05-28
  Administered 2022-05-28: 2 mL via INTRAVENOUS

## 2022-05-29 LAB — ECHOCARDIOGRAM COMPLETE
AR max vel: 4.71 cm2
AV Area VTI: 4.64 cm2
AV Area mean vel: 4.53 cm2
AV Mean grad: 3.5 mmHg
AV Peak grad: 5.3 mmHg
Ao pk vel: 1.15 m/s
Area-P 1/2: 4.29 cm2
Calc EF: 50.4 %
Est EF: 50
MV M vel: 4.1 m/s
MV Peak grad: 67.1 mmHg
P 1/2 time: 1063 msec
S' Lateral: 3.8 cm
Single Plane A2C EF: 46.7 %
Single Plane A4C EF: 48.7 %

## 2022-06-05 ENCOUNTER — Inpatient Hospital Stay (HOSPITAL_COMMUNITY): Payer: Medicare Other

## 2022-06-05 ENCOUNTER — Inpatient Hospital Stay (HOSPITAL_COMMUNITY)
Admit: 2022-06-05 | Discharge: 2022-06-13 | DRG: 872 | Disposition: A | Discharge status: Home Health | Payer: Medicare Other | Source: Other Acute Inpatient Hospital | Attending: Family Medicine | Admitting: Family Medicine

## 2022-06-05 ENCOUNTER — Encounter (HOSPITAL_COMMUNITY): Payer: Self-pay

## 2022-06-05 DIAGNOSIS — M19012 Primary osteoarthritis, left shoulder: Secondary | ICD-10-CM | POA: Diagnosis not present

## 2022-06-05 DIAGNOSIS — Z8249 Family history of ischemic heart disease and other diseases of the circulatory system: Secondary | ICD-10-CM

## 2022-06-05 DIAGNOSIS — I214 Non-ST elevation (NSTEMI) myocardial infarction: Secondary | ICD-10-CM | POA: Diagnosis not present

## 2022-06-05 DIAGNOSIS — R600 Localized edema: Secondary | ICD-10-CM | POA: Diagnosis not present

## 2022-06-05 DIAGNOSIS — S46012A Strain of muscle(s) and tendon(s) of the rotator cuff of left shoulder, initial encounter: Secondary | ICD-10-CM | POA: Diagnosis not present

## 2022-06-05 DIAGNOSIS — Z781 Physical restraint status: Secondary | ICD-10-CM

## 2022-06-05 DIAGNOSIS — K760 Fatty (change of) liver, not elsewhere classified: Secondary | ICD-10-CM | POA: Diagnosis not present

## 2022-06-05 DIAGNOSIS — I5022 Chronic systolic (congestive) heart failure: Secondary | ICD-10-CM | POA: Diagnosis present

## 2022-06-05 DIAGNOSIS — M25512 Pain in left shoulder: Secondary | ICD-10-CM | POA: Diagnosis present

## 2022-06-05 DIAGNOSIS — G8929 Other chronic pain: Secondary | ICD-10-CM | POA: Diagnosis present

## 2022-06-05 DIAGNOSIS — Z6828 Body mass index (BMI) 28.0-28.9, adult: Secondary | ICD-10-CM

## 2022-06-05 DIAGNOSIS — I161 Hypertensive emergency: Secondary | ICD-10-CM | POA: Diagnosis not present

## 2022-06-05 DIAGNOSIS — Z8546 Personal history of malignant neoplasm of prostate: Secondary | ICD-10-CM

## 2022-06-05 DIAGNOSIS — I110 Hypertensive heart disease with heart failure: Secondary | ICD-10-CM | POA: Diagnosis not present

## 2022-06-05 DIAGNOSIS — M0000 Staphylococcal arthritis, unspecified joint: Secondary | ICD-10-CM

## 2022-06-05 DIAGNOSIS — I10 Essential (primary) hypertension: Secondary | ICD-10-CM | POA: Diagnosis present

## 2022-06-05 DIAGNOSIS — L409 Psoriasis, unspecified: Secondary | ICD-10-CM | POA: Diagnosis present

## 2022-06-05 DIAGNOSIS — Z87891 Personal history of nicotine dependence: Secondary | ICD-10-CM

## 2022-06-05 DIAGNOSIS — R509 Fever, unspecified: Secondary | ICD-10-CM | POA: Diagnosis not present

## 2022-06-05 DIAGNOSIS — R9431 Abnormal electrocardiogram [ECG] [EKG]: Secondary | ICD-10-CM | POA: Diagnosis not present

## 2022-06-05 DIAGNOSIS — K219 Gastro-esophageal reflux disease without esophagitis: Secondary | ICD-10-CM | POA: Diagnosis present

## 2022-06-05 DIAGNOSIS — K8020 Calculus of gallbladder without cholecystitis without obstruction: Secondary | ICD-10-CM | POA: Diagnosis not present

## 2022-06-05 DIAGNOSIS — M868X8 Other osteomyelitis, other site: Secondary | ICD-10-CM | POA: Diagnosis not present

## 2022-06-05 DIAGNOSIS — E1165 Type 2 diabetes mellitus with hyperglycemia: Secondary | ICD-10-CM | POA: Diagnosis not present

## 2022-06-05 DIAGNOSIS — Z794 Long term (current) use of insulin: Secondary | ICD-10-CM

## 2022-06-05 DIAGNOSIS — I252 Old myocardial infarction: Secondary | ICD-10-CM | POA: Diagnosis not present

## 2022-06-05 DIAGNOSIS — M549 Dorsalgia, unspecified: Secondary | ICD-10-CM | POA: Diagnosis not present

## 2022-06-05 DIAGNOSIS — Z961 Presence of intraocular lens: Secondary | ICD-10-CM | POA: Diagnosis present

## 2022-06-05 DIAGNOSIS — I447 Left bundle-branch block, unspecified: Secondary | ICD-10-CM | POA: Diagnosis not present

## 2022-06-05 DIAGNOSIS — A4101 Sepsis due to Methicillin susceptible Staphylococcus aureus: Secondary | ICD-10-CM

## 2022-06-05 DIAGNOSIS — Z79899 Other long term (current) drug therapy: Secondary | ICD-10-CM

## 2022-06-05 DIAGNOSIS — M546 Pain in thoracic spine: Secondary | ICD-10-CM | POA: Diagnosis not present

## 2022-06-05 DIAGNOSIS — B9561 Methicillin susceptible Staphylococcus aureus infection as the cause of diseases classified elsewhere: Secondary | ICD-10-CM | POA: Diagnosis not present

## 2022-06-05 DIAGNOSIS — I2510 Atherosclerotic heart disease of native coronary artery without angina pectoris: Secondary | ICD-10-CM | POA: Diagnosis not present

## 2022-06-05 DIAGNOSIS — R000 Tachycardia, unspecified: Secondary | ICD-10-CM | POA: Diagnosis not present

## 2022-06-05 DIAGNOSIS — I440 Atrioventricular block, first degree: Secondary | ICD-10-CM | POA: Diagnosis present

## 2022-06-05 DIAGNOSIS — H9193 Unspecified hearing loss, bilateral: Secondary | ICD-10-CM | POA: Diagnosis present

## 2022-06-05 DIAGNOSIS — Z833 Family history of diabetes mellitus: Secondary | ICD-10-CM

## 2022-06-05 DIAGNOSIS — I7121 Aneurysm of the ascending aorta, without rupture: Secondary | ICD-10-CM | POA: Diagnosis not present

## 2022-06-05 DIAGNOSIS — E663 Overweight: Secondary | ICD-10-CM | POA: Diagnosis not present

## 2022-06-05 DIAGNOSIS — U071 COVID-19: Secondary | ICD-10-CM

## 2022-06-05 DIAGNOSIS — R7881 Bacteremia: Principal | ICD-10-CM | POA: Diagnosis present

## 2022-06-05 DIAGNOSIS — R7989 Other specified abnormal findings of blood chemistry: Secondary | ICD-10-CM | POA: Diagnosis not present

## 2022-06-05 DIAGNOSIS — E876 Hypokalemia: Secondary | ICD-10-CM | POA: Diagnosis not present

## 2022-06-05 DIAGNOSIS — F40240 Claustrophobia: Secondary | ICD-10-CM | POA: Diagnosis present

## 2022-06-05 DIAGNOSIS — I351 Nonrheumatic aortic (valve) insufficiency: Secondary | ICD-10-CM | POA: Diagnosis not present

## 2022-06-05 DIAGNOSIS — M00012 Staphylococcal arthritis, left shoulder: Secondary | ICD-10-CM | POA: Diagnosis not present

## 2022-06-05 DIAGNOSIS — E782 Mixed hyperlipidemia: Secondary | ICD-10-CM | POA: Diagnosis not present

## 2022-06-05 DIAGNOSIS — E1169 Type 2 diabetes mellitus with other specified complication: Secondary | ICD-10-CM | POA: Diagnosis present

## 2022-06-05 DIAGNOSIS — Z8679 Personal history of other diseases of the circulatory system: Secondary | ICD-10-CM

## 2022-06-05 DIAGNOSIS — M542 Cervicalgia: Secondary | ICD-10-CM | POA: Diagnosis present

## 2022-06-05 DIAGNOSIS — I700 Atherosclerosis of aorta: Secondary | ICD-10-CM | POA: Diagnosis not present

## 2022-06-05 DIAGNOSIS — I340 Nonrheumatic mitral (valve) insufficiency: Secondary | ICD-10-CM | POA: Diagnosis not present

## 2022-06-05 DIAGNOSIS — R079 Chest pain, unspecified: Secondary | ICD-10-CM | POA: Diagnosis not present

## 2022-06-05 DIAGNOSIS — E119 Type 2 diabetes mellitus without complications: Secondary | ICD-10-CM | POA: Diagnosis not present

## 2022-06-05 DIAGNOSIS — Z951 Presence of aortocoronary bypass graft: Secondary | ICD-10-CM

## 2022-06-05 DIAGNOSIS — R410 Disorientation, unspecified: Secondary | ICD-10-CM | POA: Diagnosis not present

## 2022-06-05 DIAGNOSIS — W57XXXA Bitten or stung by nonvenomous insect and other nonvenomous arthropods, initial encounter: Secondary | ICD-10-CM | POA: Diagnosis present

## 2022-06-05 LAB — BASIC METABOLIC PANEL
Anion gap: 15 (ref 5–15)
BUN: 17 mg/dL (ref 8–23)
CO2: 22 mmol/L (ref 22–32)
Calcium: 8.8 mg/dL — ABNORMAL LOW (ref 8.9–10.3)
Chloride: 94 mmol/L — ABNORMAL LOW (ref 98–111)
Creatinine, Ser: 1.05 mg/dL (ref 0.61–1.24)
GFR, Estimated: >60 mL/min (ref >60)
Glucose, Bld: 398 mg/dL — ABNORMAL HIGH (ref 70–99)
Potassium: 4 mmol/L (ref 3.5–5.1)
Sodium: 131 mmol/L — ABNORMAL LOW (ref 135–145)

## 2022-06-05 LAB — CBC WITH DIFFERENTIAL/PLATELET
Abs Immature Granulocytes: 0.09 10*3/uL — ABNORMAL HIGH (ref 0.00–0.07)
Basophils Absolute: 0 10*3/uL (ref 0.0–0.1)
Basophils Relative: 0 %
Eosinophils Absolute: 0 10*3/uL (ref 0.0–0.5)
Eosinophils Relative: 0 %
HCT: 38.7 % — ABNORMAL LOW (ref 39.0–52.0)
Hemoglobin: 12.9 g/dL — ABNORMAL LOW (ref 13.0–17.0)
Immature Granulocytes: 1 %
Lymphocytes Relative: 3 %
Lymphs Abs: 0.4 10*3/uL — ABNORMAL LOW (ref 0.7–4.0)
MCH: 27 pg (ref 26.0–34.0)
MCHC: 33.3 g/dL (ref 30.0–36.0)
MCV: 81 fL (ref 80.0–100.0)
Monocytes Absolute: 1.1 10*3/uL — ABNORMAL HIGH (ref 0.1–1.0)
Monocytes Relative: 9 %
Neutro Abs: 11.3 10*3/uL — ABNORMAL HIGH (ref 1.7–7.7)
Neutrophils Relative %: 87 %
Platelets: 166 10*3/uL (ref 150–400)
RBC: 4.78 MIL/uL (ref 4.22–5.81)
RDW: 13.8 % (ref 11.5–15.5)
WBC: 13 10*3/uL — ABNORMAL HIGH (ref 4.0–10.5)
nRBC: 0 % (ref 0.0–0.2)

## 2022-06-05 LAB — GLUCOSE, CAPILLARY
Glucose-Capillary: 387 mg/dL — ABNORMAL HIGH (ref 70–99)
Glucose-Capillary: 366 mg/dL — ABNORMAL HIGH (ref 70–99)

## 2022-06-05 LAB — HEPARIN LEVEL (UNFRACTIONATED): Heparin Unfractionated: 0.13 IU/mL — ABNORMAL LOW (ref 0.30–0.70)

## 2022-06-05 MED ORDER — ACETAMINOPHEN 325 MG PO TABS
650.0000 mg | ORAL_TABLET | ORAL | Status: DC | PRN
  Administered 2022-06-05 – 2022-06-10 (×12): 650 mg via ORAL
  Filled 2022-06-05 (×12): qty 2

## 2022-06-05 MED ORDER — VANCOMYCIN HCL 2000 MG/400ML IV SOLN
2000.0000 mg | Freq: Once | INTRAVENOUS | Status: AC
  Administered 2022-06-05: 2000 mg via INTRAVENOUS
  Filled 2022-06-05: qty 400

## 2022-06-05 MED ORDER — CARVEDILOL 6.25 MG PO TABS
6.2500 mg | ORAL_TABLET | Freq: Two times a day (BID) | ORAL | Status: DC
  Administered 2022-06-05 – 2022-06-06 (×2): 6.25 mg via ORAL
  Filled 2022-06-05 (×2): qty 1

## 2022-06-05 MED ORDER — ASPIRIN 81 MG PO TBEC
81.0000 mg | DELAYED_RELEASE_TABLET | Freq: Every day | ORAL | Status: DC
  Administered 2022-06-06 – 2022-06-13 (×8): 81 mg via ORAL
  Filled 2022-06-05 (×8): qty 1

## 2022-06-05 MED ORDER — SODIUM CHLORIDE 0.9% FLUSH: 3.0000 mL | INTRAVENOUS | Status: DC | PRN

## 2022-06-05 MED ORDER — SODIUM CHLORIDE 0.9 % WEIGHT BASED INFUSION
1.0000 mL/kg/h | INTRAVENOUS | Status: DC
  Administered 2022-06-06: 1 mL/kg/h via INTRAVENOUS

## 2022-06-05 MED ORDER — ONDANSETRON HCL 4 MG/2ML IJ SOLN: 4.0000 mg | Freq: Four times a day (QID) | INTRAMUSCULAR | Status: DC | PRN

## 2022-06-05 MED ORDER — NITROGLYCERIN 0.4 MG SL SUBL: 0.4000 mg | SUBLINGUAL_TABLET | SUBLINGUAL | Status: DC | PRN

## 2022-06-05 MED ORDER — PIPERACILLIN-TAZOBACTAM 3.375 G IVPB
3.3750 g | Freq: Three times a day (TID) | INTRAVENOUS | Status: DC
  Administered 2022-06-06: 3.375 g via INTRAVENOUS
  Filled 2022-06-05: qty 50

## 2022-06-05 MED ORDER — AMLODIPINE BESYLATE 10 MG PO TABS
10.0000 mg | ORAL_TABLET | Freq: Every day | ORAL | Status: DC
  Administered 2022-06-05 – 2022-06-13 (×9): 10 mg via ORAL
  Filled 2022-06-05 (×9): qty 1

## 2022-06-05 MED ORDER — SODIUM CHLORIDE 0.9% FLUSH
3.0000 mL | Freq: Two times a day (BID) | INTRAVENOUS | Status: DC
  Administered 2022-06-05 – 2022-06-13 (×13): 3 mL via INTRAVENOUS

## 2022-06-05 MED ORDER — SODIUM CHLORIDE 0.9 % IV SOLN: 250.0000 mL | INTRAVENOUS | Status: DC | PRN

## 2022-06-05 MED ORDER — HEPARIN (PORCINE) 25000 UT/250ML-% IV SOLN
2000.0000 [IU]/h | INTRAVENOUS | Status: DC
  Administered 2022-06-06: 1850 [IU]/h via INTRAVENOUS
  Administered 2022-06-06: 1550 [IU]/h via INTRAVENOUS
  Administered 2022-06-07: 2000 [IU]/h via INTRAVENOUS
  Filled 2022-06-05 (×3): qty 250

## 2022-06-05 MED ORDER — PIPERACILLIN-TAZOBACTAM 3.375 G IVPB 30 MIN
3.3750 g | Freq: Once | INTRAVENOUS | Status: AC
  Administered 2022-06-05: 3.375 g via INTRAVENOUS
  Filled 2022-06-05 (×2): qty 50

## 2022-06-05 MED ORDER — VANCOMYCIN HCL 1250 MG/250ML IV SOLN
1250.0000 mg | Freq: Two times a day (BID) | INTRAVENOUS | Status: DC
  Administered 2022-06-06: 1250 mg via INTRAVENOUS
  Filled 2022-06-05: qty 250

## 2022-06-05 MED ORDER — SODIUM CHLORIDE 0.9 % WEIGHT BASED INFUSION
3.0000 mL/kg/h | INTRAVENOUS | Status: AC
  Administered 2022-06-06: 3 mL/kg/h via INTRAVENOUS

## 2022-06-05 MED ORDER — INSULIN ASPART 100 UNIT/ML IJ SOLN: 0.0000 [IU] | Freq: Three times a day (TID) | INTRAMUSCULAR | Status: DC

## 2022-06-05 NOTE — Progress Notes (Addendum)
Notified by RN that Mr. George [name] had a new 102.82F fever along with his known sinus tachycardia. Evaluated patient at bedside. He is complaining of overall malaise and base of neck pain. He has waxing and waning attention and at times is confused, A/Ox2 (self, year, did not know he was in the hospital). No current chest pain or shortness of breath. He denies recent sick contacts, cough, rhinorrhea, dysuria, urinary frequency/urgency, abdominal or suprapubic/flank pain. No focal midline tenderness with palpitation over base of neck.  Will obtain a BMP, CBC with diff, blood cultures x2, UA, and CXR. Given he is SIRS positive with AMS (still unclear if related to the opiates he received prior to transfer to [facility], delirium, or alternative etiology), will err on the side of caution and cover empirically for sepsis with broad spectrum antibiotics until blood cultures result.  Please start vanc/zosyn after blood cultures have been drawn.  Bella Kennedy, MD Cardiology 06/05/2022

## 2022-06-05 NOTE — Progress Notes (Addendum)
ANTICOAGULATION CONSULT NOTE - Initial Consult  Pharmacy Consult for heparin Indication: chest pain/ACS  No Known Allergies  Patient Measurements: Height: 6\' 4"  (193 cm) Weight: 103.6 kg (228 lb 6.3 oz) IBW/kg (Calculated) : 86.8 Heparin Dosing Weight: 103.6  Vital Signs: Temp: 99.4 F (37.4 C) (05/22 1827) Temp Source: Oral (05/22 1827) BP: 159/76 (05/22 1827) Pulse Rate: 118 (05/22 1827)  Labs: No results for input(s): "HGB", "HCT", "PLT", "APTT", "LABPROT", "INR", "HEPARINUNFRC", "HEPRLOWMOCWT", "CREATININE", "CKTOTAL", "CKMB", "TROPONINIHS" in the last 72 hours.  CrCl cannot be calculated (Patient's most recent lab result is older than the maximum 21 days allowed.).   Medical History: Past Medical History:  Diagnosis Date   Ascending aortic aneurysm (HCC)    4.1 cm in 2019   Chronic back pain    Coronary artery disease    Status post CABG 2014 (LIMA to LAD, SVG to ramus, SVG to OM 2)   Dermatophytosis of the body    Eczema    Essential hypertension    GERD (gastroesophageal reflux disease)    Herpes zoster without mention of complication    History of bronchitis    History of claustrophobia    HOH (hard of hearing)    Mixed hyperlipidemia    Prostate cancer (HCC)    Psoriasis and similar disorder    Sinusitis    Type 2 diabetes mellitus (HCC)     Medications:  Scheduled:   [START ON 06/06/2022] insulin aspart  0-15 Units Subcutaneous TID WC    Assessment: 77 yo male presented to [facility] with neck, back, chest, and abdominal pain. Notable PMH includes CAD s/p bypass graft. Tx to [facility] for cardiac cath. Was started on IV heparin at [facility] - confirmed w/ RN heparin is running at 1440 units/hr which is approx 13 units/kg/hr (unclear time this was started but assuming > 6h since work-up was early this AM). No AC PTA. Troponins mildly elevated at 120 >> 235.   Goal of Therapy:  Heparin level 0.3-0.7 units/ml Monitor platelets by anticoagulation protocol:  Yes   Plan:  Stat heparin level - assuming patient has been on IV heparin > 6 hours Adjusted heparin to 1450 units/hr (to align with [facility] standard of rounding to nearest 50 units) until level results in the meantime  Rexford Maus, PharmD, BCPS 06/05/2022 6:35 PM  PM UPDATE: - Heparin level subtherapeutic at 0.13 - Increase heparin rate to 1550 units/hr (conservative increase given unk time of heparin initial start)  Rexford Maus, PharmD, BCPS 06/05/2022 9:42 PM

## 2022-06-05 NOTE — Progress Notes (Signed)
Pharmacy Antibiotic Note  George [name] is a 77 y.o. male admitted on 06/05/2022 as transfer from [facility], now with fever, confusion, concern for sepsis.  Pharmacy has been consulted for vancomycin and zosyn dosing.  SCr at [facility] 1.03  Plan: Vancomycin 2000 mg IV x 1, then 1250 mg IV q 12h (eAUC 510) Zosyn 3.375g IV every 8 hours (extended 4h infusion) Monitor renal function, Cx and clinical progression to narrow Vancomycin levels as indicated  Height: 6\' 4"  (193 cm) Weight: 103.6 kg (228 lb 6.3 oz) IBW/kg (Calculated) : 86.8  Temp (24hrs), Avg:100.9 F (38.3 C), Min:99.4 F (37.4 C), Max:102.8 F (39.3 C)  No results for input(s): "WBC", "CREATININE", "LATICACIDVEN", "VANCOTROUGH", "VANCOPEAK", "VANCORANDOM", "GENTTROUGH", "GENTPEAK", "GENTRANDOM", "TOBRATROUGH", "TOBRAPEAK", "TOBRARND", "AMIKACINPEAK", "AMIKACINTROU", "AMIKACIN" in the last 168 hours.  CrCl cannot be calculated (Patient's most recent lab result is older than the maximum 21 days allowed.).    No Known Allergies  Daylene Posey, PharmD, [facility] Clinical Pharmacist ED Pharmacist Phone # [phone] 06/05/2022 10:38 PM

## 2022-06-05 NOTE — Progress Notes (Signed)
    Called to bedside as patient became agitated and swung at staff. On admission he was noted to be slightly lethargic, somewhat unclear on timeline of events that brought him to the ED. Wife reports he does not take pain medication at all, and usually does not do well when he is given high doses. Noted he received 2 doses of IV morphine while a [facility] and mentation as not been quite at baseline since that time. He was placed in wrist restraints.   On interview is was agitated, stating he was being treated like a fugitive. He is alert to self, year and able to state the president. Confused on current location. With wife and granddaughter at the bedside he became calmer. Remembers conversation regarding plans for cardiac cath tomorrow. No focal neuro deficits noted. Suspect this is secondary to the morphine. Wrist restraints removed while I was present. Patient agreeable to care at this time. Will reevaluate in the am to ensure he is appropriate for cath.  Janice Coffin, NP-C 06/05/2022, 8:00 PM Pager: [phone]

## 2022-06-05 NOTE — H&P (Addendum)
Cardiology Admission History and Physical   Patient ID: George [name] MRN: [id number]; DOB: [birth year]   Admission date: 06/05/2022  PCP:  Kirstie Peri, MD   [facility] Providers Cardiologist:  Nona Dell, MD      Chief Complaint:  chest pain/NSTEMI  Patient Profile:   George [name] is a 77 y.o. 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  History of Present Illness:   George [name] is a 77 year old male with past medical history noted above.  He has been followed by Dr. Diona Browner as an outpatient.  He was seen June 2022 with complaints of shortness of breath and fatigue.  Echo showed LVEF of 40 to 45% and was started on Entresto at that time.  Seen in follow-up September 2022 and was unable to complete Lexiscan Myoview due to history of claustrophobia.  Currently this was able to be arranged and felt to be low risk with no evidence of ischemia noted.  He called into the office back in April complaining of left arm and chest pain with activity.  Noted shortness of breath with walking short distances.  He is set up for an outpatient Lexiscan as well as echocardiogram.  His metoprolol was stopped and switched to Coreg as well as started on Entresto.  Lexiscan Myoview on 4/24 was intermediate risk secondary to reduced systolic function.  No evidence of ischemia, medium defect with moderate reduction in the apical to basal locations that was fixed.  There was a abnormal wall motion in the defect consistent with infarct.  Follow-up echocardiogram showed slight improvement in LVEF to 50%, indeterminate diastolic function, normal RV size, mild MR.  He was scheduled for outpatient follow-up but presented to [facility] on 5/22.  In talking with the patient and his wife he developed significant shoulder and neck pain Sunday evening.  This  radiated down into his left arm and through to his back.  Symptoms were constant and ultimately presented to [facility] on 5/22.  Labs in the ED showed sodium 132, potassium 4.4, creatinine 1.03, WBC 10.4, hemoglobin 13.7, high-sensitivity troponin 120.  Sugars were noted in the 400 range, treated with insulin prior to transfer. CT angio chest with known 4.1 cm ascending thoracic aortic aneurysm, no acute finding.  Chest x-ray with no acute edema.  EKG showed sinus tachycardia, 102 bpm, left bundle branch block, nonspecific changes started on IV heparin, given IV morphine and transferred to [facility] for further evaluation with concerns for non-STEMI.  Patient reports pain is not very similar to what he experienced with his prior CABG.  Does have some musculoskeletal component as well.  Denies any known injury.     Past Medical History:  Diagnosis Date   Ascending aortic aneurysm (HCC)    4.1 cm in 2019   Chronic back pain    Coronary artery disease    Status post CABG 2014 (LIMA to LAD, SVG to ramus, SVG to OM 2)   Dermatophytosis of the body    Eczema    Essential hypertension    GERD (gastroesophageal reflux disease)    Herpes zoster without mention of complication    History of bronchitis    History of claustrophobia    HOH (hard of hearing)    Mixed hyperlipidemia    Prostate cancer (HCC)    Psoriasis and similar disorder  Sinusitis    Type 2 diabetes mellitus [facility])     Past Surgical History:  Procedure Laterality Date   CATARACT EXTRACTION W/PHACO Left 04/04/2014   Procedure: CATARACT EXTRACTION PHACO AND INTRAOCULAR LENS PLACEMENT (IOC);  Surgeon: Susa Simmonds, MD;  Location: [facility];  Service: Ophthalmology;  Laterality: Left;  CDE:4.49   CATARACT EXTRACTION W/PHACO Right 01/02/2015   Procedure: CATARACT EXTRACTION PHACO AND INTRAOCULAR LENS PLACEMENT; CDE:  6.36;  Surgeon: Susa Simmonds, MD;  Location: [facility];  Service: Ophthalmology;  Laterality: Right;    CIRCUMCISION  2010   CORONARY ARTERY BYPASS GRAFT N/A 08/13/2012   Procedure: CORONARY ARTERY BYPASS GRAFTING (CABG);  Surgeon: Kerin Perna, MD;  Location: [facility] OR;  Service: Open Heart Surgery;  Laterality: N/A;   ENDOVEIN HARVEST OF GREATER SAPHENOUS VEIN Right 08/13/2012   Procedure: ENDOVEIN HARVEST OF GREATER SAPHENOUS VEIN;  Surgeon: Kerin Perna, MD;  Location: [facility] OR;  Service: Open Heart Surgery;  Laterality: Right;   INTRAOPERATIVE TRANSESOPHAGEAL ECHOCARDIOGRAM N/A 08/13/2012   Procedure: INTRAOPERATIVE TRANSESOPHAGEAL ECHOCARDIOGRAM;  Surgeon: Kerin Perna, MD;  Location: [facility] OR;  Service: Open Heart Surgery;  Laterality: N/A;   LEFT HEART CATHETERIZATION WITH CORONARY ANGIOGRAM N/A 08/07/2012   Procedure: LEFT HEART CATHETERIZATION WITH CORONARY ANGIOGRAM;  Surgeon: Peter M Swaziland, MD;  Location: [facility] CATH LAB;  Service: Cardiovascular;  Laterality: N/A;   RADIOACTIVE SEED IMPLANT N/A 11/25/2017   Procedure: RADIOACTIVE SEED IMPLANT/BRACHYTHERAPY IMPLANT;  Surgeon: Bjorn Pippin, MD;  Location: [facility];  Service: Urology;  Laterality: N/A;   SEED IMPLANT DONE AT [place]  01/2017     Medications Prior to Admission: Prior to Admission medications   Medication Sig Start Date End Date Taking? Authorizing Provider  amLODipine (NORVASC) 10 MG tablet Take 1 tablet (10 mg total) by mouth daily. 09/19/20   Netta Neat., NP  atorvastatin (LIPITOR) 20 MG tablet Take 20 mg by mouth daily.    [provider]  carvedilol (COREG) 6.25 MG tablet Take 1 tablet (6.25 mg total) by mouth 2 (two) times daily. 05/02/22   Sharlene Dory, NP  cloNIDine (CATAPRES) 0.1 MG tablet Take 1 tablet by mouth daily. 10/21/14   [provider]  insulin glargine (LANTUS) 100 UNIT/ML Solostar Pen Inject 70 Units into the skin at bedtime.    [provider]  nitroGLYCERIN (NITROSTAT) 0.4 MG SL tablet Place 1 tablet (0.4 mg total) under the tongue every 5 (five) minutes  x 3 doses as needed for chest pain (if no relief after 3rd dose, proceed to ED for evaluation or call 911). 04/24/22   Jonelle Sidle, MD  NOVOLOG FLEXPEN 100 UNIT/ML FlexPen INJECT 25-31 UNITS INTO THE SKIN THREE TIMES DAILY BEFORE MEALS Patient taking differently: Inject 50 Units into the skin 3 (three) times daily with meals. 09/18/17   Roma Kayser, MD  sacubitril-valsartan (ENTRESTO) 49-51 MG Take 1 tablet by mouth 2 (two) times daily. Patient not taking: Reported on 05/02/2022 10/20/20   Netta Neat., NP     Allergies:   No Known Allergies  Social History:   Social History   Socioeconomic History   Marital status: Married    Spouse name: Not on file   Number of children: Not on file   Years of education: Not on file   Highest education level: Not on file  Occupational History    Comment: logger  Tobacco Use   Smoking status: Former    Packs/day:  1.00    Years: 4.00    Additional pack years: 0.00    Total pack years: 4.00    Types: Cigarettes    Start date: 03/04/1965    Quit date: 03/05/1967    Years since quitting: 55.2    Passive exposure: Never   Smokeless tobacco: Never  Vaping Use   Vaping Use: Never used  Substance and Sexual Activity   Alcohol use: No    Alcohol/week: 0.0 standard drinks of alcohol   Drug use: No   Sexual activity: Not on file  Other Topics Concern   Not on file  Social History Narrative   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   Social Determinants of Health   Financial Resource Strain: Not on file  Food Insecurity: Not on file  Transportation Needs: Not on file  Physical Activity: Not on file  Stress: Not on file  Social Connections: Not on file  Intimate Partner Violence: Not on file    Family History:   The patient's family history includes CAD in an other family member; Cataracts in his mother; Diabetes in an other family member; Heart attack in an other family member. There is no history of Amblyopia,  Blindness, Glaucoma, Macular degeneration, Retinal detachment, Strabismus, or Retinitis pigmentosa.    ROS:  Please see the history of present illness.  All other ROS reviewed and negative.     Physical Exam/Data:  There were no vitals filed for this visit. No intake or output data in the 24 hours ending 06/05/22 1822    05/08/2022   12:19 PM 05/02/2022    3:31 PM 10/16/2020    8:00 AM  Last 3 Weights  Weight (lbs) 239 lb 239 lb 9.6 oz 237 lb  Weight (kg) 108.41 kg 108.682 kg 107.502 kg     There is no height or weight on file to calculate BMI.  General:  Well nourished, well developed, in no acute distress HEENT: normal Neck: no JVD Vascular: No carotid bruits; Distal pulses 2+ bilaterally   Cardiac:  normal S1, S2; RRR; no murmur  Lungs:  clear to auscultation bilaterally, no wheezing, rhonchi or rales  Abd: soft, nontender, no hepatomegaly  Ext: no edema Musculoskeletal:  No deformities, BUE and BLE strength normal and equal Skin: warm and dry  Neuro:  CNs 2-12 intact, no focal abnormalities noted Psych:  Normal affect    EKG:  The ECG that was done 5/22 was personally reviewed and demonstrates sinus tachycardia, 102 bpm, left bundle branch block, nonspecific changes  Relevant CV Studies:  Echo: 05/28/2022  IMPRESSIONS     1. Left ventricular ejection fraction, by estimation, is 50%. The left  ventricle has mildly decreased function. Left ventricular endocardial  border not optimally defined to evaluate regional wall motion. There is  moderate left ventricular hypertrophy.  Left ventricular diastolic parameters are indeterminate. Elevated left  atrial pressure.   2. Right ventricular systolic function is normal. The right ventricular  size is normal. Tricuspid regurgitation signal is inadequate for assessing  PA pressure.   3. The mitral valve is abnormal. Mild mitral valve regurgitation. No  evidence of mitral stenosis.   4. The aortic valve is tricuspid. There is  mild calcification of the  aortic valve. There is mild thickening of the aortic valve. Aortic valve  regurgitation is trivial. No aortic stenosis is present.   5. Aortic dilatation noted. There is mild dilatation of the aortic root,  measuring 42 mm. There is mild  dilatation of the ascending aorta,  measuring 40 mm.   Comparison(s): Echocardiogram done 07/05/20 showed an EF of 40-45%.   FINDINGS   Left Ventricle: Left ventricular ejection fraction, by estimation, is  50%. The left ventricle has mildly decreased function. Left ventricular  endocardial border not optimally defined to evaluate regional wall motion.  Definity contrast agent was given IV   to delineate the left ventricular endocardial borders. Global  longitudinal strain performed but not reported based on interpreter  judgement due to suboptimal tracking. The left ventricular internal cavity  size was normal in size. There is moderate left  ventricular hypertrophy. Left ventricular diastolic parameters are  indeterminate. Elevated left atrial pressure.   Right Ventricle: The right ventricular size is normal. Right vetricular  wall thickness was not well visualized. Right ventricular systolic  function is normal. Tricuspid regurgitation signal is inadequate for  assessing PA pressure.   Left Atrium: Left atrial size was normal in size.   Right Atrium: Right atrial size was normal in size.   Pericardium: There is no evidence of pericardial effusion.   Mitral Valve: The mitral valve is abnormal. Mild mitral valve  regurgitation. No evidence of mitral valve stenosis.   Tricuspid Valve: The tricuspid valve is normal in structure. Tricuspid  valve regurgitation is trivial. No evidence of tricuspid stenosis.   Aortic Valve: The aortic valve is tricuspid. There is mild calcification  of the aortic valve. There is mild thickening of the aortic valve. There  is mild aortic valve annular calcification. Aortic valve  regurgitation is  trivial. Aortic regurgitation PHT  measures 1063 msec. No aortic stenosis is present. Aortic valve mean  gradient measures 3.5 mmHg. Aortic valve peak gradient measures 5.3 mmHg.  Aortic valve area, by VTI measures 4.64 cm.   Pulmonic Valve: The pulmonic valve was not well visualized. Pulmonic valve  regurgitation is mild. No evidence of pulmonic stenosis.   Aorta: Aortic dilatation noted. There is mild dilatation of the aortic  root, measuring 42 mm. There is mild dilatation of the ascending aorta,  measuring 40 mm.   IAS/Shunts: The interatrial septum was not well visualized.   Stress test: 05/08/2022    Findings are consistent with prior infarction. The study is intermediate risk due to reduced systolic function.   No ST deviation was noted.   LV perfusion is abnormal. There is no evidence of ischemia. There is evidence of infarction. Defect 1: There is a medium defect with moderate reduction in uptake present in the apical to basal location(s) that is fixed. There is abnormal wall motion in the defect area. Consistent with infarction and artifact.   Left ventricular function is abnormal. Global function is moderately reduced. There were multiple regional abnormalities. Nuclear stress EF: 38 %. The left ventricular ejection fraction is moderately decreased (30-44%). End diastolic cavity size is moderately enlarged. End systolic cavity size is moderately enlarged.   Prior study available for comparison from 10/16/2020.  No significant changes.  Laboratory Data:  High Sensitivity Troponin:  No results for input(s): "TROPONINIHS" in the last 720 hours.    ChemistryNo results for input(s): "NA", "K", "CL", "CO2", "GLUCOSE", "BUN", "CREATININE", "CALCIUM", "MG", "GFRNONAA", "GFRAA", "ANIONGAP" in the last 168 hours.  No results for input(s): "PROT", "ALBUMIN", "AST", "ALT", "ALKPHOS", "BILITOT" in the last 168 hours. Lipids No results for input(s): "CHOL", "TRIG", "HDL",  "LABVLDL", "LDLCALC", "CHOLHDL" in the last 168 hours. HematologyNo results for input(s): "WBC", "RBC", "HGB", "HCT", "MCV", "MCH", "MCHC", "RDW", "PLT" in the last  168 hours. Thyroid No results for input(s): "TSH", "FREET4" in the last 168 hours. BNPNo results for input(s): "BNP", "PROBNP" in the last 168 hours.  DDimer No results for input(s): "DDIMER" in the last 168 hours.   Radiology/Studies:  No results found.   Assessment and Plan:   DREW LUDEMAN is a 76 y.o. 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  Chest pain NSTEMI ED status post CABG '14 -- Presented with left-sided neck and shoulder pain that has been constant since Sunday evening.  High-sensitivity troponin 120 while at [facility].  EKG without acute ST/T wave abnormalities.  Symptoms are somewhat atypical in nature, could be MSK related.  Given his history of CAD and elevated troponin with plan for cardiac catheterization to rule out -- Continue IV heparin, aspirin, statin, beta-blocker  Shared Decision Making/Informed Consent The risks [stroke (1 in 1000), death (1 in 1000), kidney failure [usually temporary] (1 in 500), bleeding (1 in 200), allergic reaction [possibly serious] (1 in 200)], benefits (diagnostic support and management of coronary artery disease) and alternatives of a cardiac catheterization were discussed in detail with Mr. Harnage and he is willing to proceed.  HFmrEF -- Most recent echocardiogram showed improvement in his LV function from 45% up to 50% -- Not appear volume overloaded on exam, chest x-ray without edema prior to admission -- Continue Coreg, will hold Entresto with plans for cardiac catheterization  Hypertension -- As above continue Coreg, hold Entresto with plans to resume post cath  Hyperlipidemia -- Has been on atorvastatin 20 mg daily,  will increase to 40 mg daily  Diabetes -- Blood sugars were elevated in the 400 range while at [facility], treated with 10 units of insulin -- Add SSI -- Could consider SGLT2  Ascending aortic aneurysm -- CTA at [facility] measuring 4.1 cm  Risk Assessment/Risk Scores:   TIMI Risk Score for Unstable Angina or Non-ST Elevation MI:   The patient's TIMI risk score is 4, which indicates a 20% risk of all cause mortality, new or recurrent myocardial infarction or need for urgent revascularization in the next 14 days.  Severity of Illness: The appropriate patient status for this patient is INPATIENT. Inpatient status is judged to be reasonable and necessary in order to provide the required intensity of service to ensure the patient's safety. The patient's presenting symptoms, physical exam findings, and initial radiographic and laboratory data in the context of their chronic comorbidities is felt to place them at high risk for further clinical deterioration. Furthermore, it is not anticipated that the patient will be medically stable for discharge from the hospital within 2 midnights of admission.   * I certify that at the point of admission it is my clinical judgment that the patient will require inpatient hospital care spanning beyond 2 midnights from the point of admission due to high intensity of service, high risk for further deterioration and high frequency of surveillance required.*   For questions or updates, please contact [facility] Please consult [email or web address] for contact info under     Signed, Laverda Page, NP  06/05/2022 6:22 PM   Agree with note by Laverda Page NP-C  Mr. Covalt is a 77 year old mildly overweight married Caucasian male patient of Dr. Ival Bible with a history of CAD status post bypass grafting here at [facility] by Dr. Maren Beach in 2014.  He has a history of hypertension, hyperlipidemia, type 2 diabetes and a ascending thoracic  aortic aneurysm measuring approximate 40 mm that is been stable.  He did see Sharlene Dory, NP in the office recently who performed a stress test that was low risk with some attenuation artifact and echo revealed EF of 50%.  He is a logger by trade, and is still working on daily basis.  He developed neck back chest abdominal pain on Sunday which is fairly constant.  The pain is somewhat positional.  He went to the [facility] where he was evaluated and transferred here for further evaluation.  His EKG showed no acute changes.  He had sinus tach with a nonspecific IVCD.  His troponins were mildly elevated as was his BNP in the 1600 range although he denies symptoms of heart failure.  Exam is relatively benign.  Lungs are clear.  He has no peripheral edema.  At this point, I do not think his pain is necessarily ischemically mediated although I am worried about the chest component and a mildly elevated troponin.  He is on IV heparin.  Plan coronary angiography tomorrow.  If this is does not show culprit lesion he will need workup of his neck and back pain.   Runell Gess, M.D., FACP, [facility], [facility] [address] [place], [place]  [address]  [phone] 06/05/2022 6:30 PM

## 2022-06-05 NOTE — Progress Notes (Signed)
MEWS Progress Note  Patient Details Name: George [name] MRN: [id number] DOB: 1945-09-20 Today's Date: 06/05/2022   MEWS Flowsheet Documentation:  Assess: MEWS Score Temp: (!) 102.8 F (39.3 C) BP: (!) 178/95 MAP (mmHg): 114 Pulse Rate: (!) 118 ECG Heart Rate: (!) 126 Resp: (!) 116 Level of Consciousness: Alert SpO2: 90 % O2 Device: Room Air Assess: MEWS Score MEWS Temp: 2 MEWS Systolic: 0 MEWS Pulse: 2 MEWS RR: 3 MEWS LOC: 0 MEWS Score: 7 MEWS Score Color: Red Assess: SIRS CRITERIA SIRS Temperature : 1 SIRS Respirations : 1 SIRS Pulse: 1 SIRS WBC: 0 SIRS Score Sum : 3 Assess: if the MEWS score is Yellow or Red Were vital signs taken at a resting state?: Yes Focused Assessment: Change from prior assessment (see assessment flowsheet) Does the patient meet 2 or more of the SIRS criteria?: Yes Does the patient have a confirmed or suspected source of infection?: No MEWS guidelines implemented : Yes, red Treat MEWS Interventions: Considered administering scheduled or prn medications/treatments as ordered Take Vital Signs Increase Vital Sign Frequency : Red: Q1hr x2, continue Q4hrs until patient remains green for 12hrs Escalate MEWS: Escalate: Red: Discuss with charge nurse and notify provider. Consider notifying RRT. If remains red for 2 hours consider need for higher level of care Notify: Charge Nurse/RN Name of Charge Nurse/RN Notified: Molli Hazard, RN Provider Notification Provider Name/Title: Juel Burrow, MD Date Provider Notified: 06/05/22 Time Provider Notified: [id number] Method of Notification: Page Notification Reason: Other (Comment) (RED MEWS for temp 102.65F and HR 126 bpm)      [name] [name] 06/05/2022, 9:30 PM

## 2022-06-06 ENCOUNTER — Encounter (HOSPITAL_COMMUNITY)
Disposition: A | Discharge status: Home Health | Payer: Self-pay | Source: Other Acute Inpatient Hospital | Attending: Family Medicine

## 2022-06-06 ENCOUNTER — Other Ambulatory Visit: Payer: Self-pay

## 2022-06-06 ENCOUNTER — Encounter (HOSPITAL_COMMUNITY): Payer: Self-pay | Admitting: Cardiovascular Disease

## 2022-06-06 ENCOUNTER — Telehealth (HOSPITAL_COMMUNITY): Payer: Self-pay | Admitting: Pharmacy Technician

## 2022-06-06 ENCOUNTER — Inpatient Hospital Stay (HOSPITAL_COMMUNITY): Payer: Medicare Other

## 2022-06-06 ENCOUNTER — Other Ambulatory Visit (HOSPITAL_COMMUNITY): Payer: Self-pay

## 2022-06-06 DIAGNOSIS — E1165 Type 2 diabetes mellitus with hyperglycemia: Secondary | ICD-10-CM | POA: Diagnosis not present

## 2022-06-06 DIAGNOSIS — M25512 Pain in left shoulder: Secondary | ICD-10-CM | POA: Diagnosis not present

## 2022-06-06 DIAGNOSIS — A4101 Sepsis due to Methicillin susceptible Staphylococcus aureus: Secondary | ICD-10-CM | POA: Diagnosis not present

## 2022-06-06 DIAGNOSIS — B9561 Methicillin susceptible Staphylococcus aureus infection as the cause of diseases classified elsewhere: Principal | ICD-10-CM | POA: Clinically undetermined

## 2022-06-06 DIAGNOSIS — R7881 Bacteremia: Secondary | ICD-10-CM | POA: Diagnosis not present

## 2022-06-06 DIAGNOSIS — I214 Non-ST elevation (NSTEMI) myocardial infarction: Secondary | ICD-10-CM | POA: Diagnosis not present

## 2022-06-06 DIAGNOSIS — Z794 Long term (current) use of insulin: Secondary | ICD-10-CM

## 2022-06-06 LAB — SEDIMENTATION RATE: Sed Rate: 69 mm/hr — ABNORMAL HIGH (ref 0–16)

## 2022-06-06 LAB — CBC
HCT: 37.2 % — ABNORMAL LOW (ref 39.0–52.0)
Hemoglobin: 12.6 g/dL — ABNORMAL LOW (ref 13.0–17.0)
MCH: 27 pg (ref 26.0–34.0)
MCHC: 33.9 g/dL (ref 30.0–36.0)
MCV: 79.7 fL — ABNORMAL LOW (ref 80.0–100.0)
Platelets: 161 10*3/uL (ref 150–400)
RBC: 4.67 MIL/uL (ref 4.22–5.81)
RDW: 13.8 % (ref 11.5–15.5)
WBC: 12.2 10*3/uL — ABNORMAL HIGH (ref 4.0–10.5)
nRBC: 0 % (ref 0.0–0.2)

## 2022-06-06 LAB — CULTURE, BLOOD (ROUTINE X 2)

## 2022-06-06 LAB — BLOOD CULTURE ID PANEL (REFLEXED) - BCID2

## 2022-06-06 LAB — BASIC METABOLIC PANEL
Anion gap: 11 (ref 5–15)
BUN: 21 mg/dL (ref 8–23)
CO2: 26 mmol/L (ref 22–32)
Calcium: 8.8 mg/dL — ABNORMAL LOW (ref 8.9–10.3)
Chloride: 97 mmol/L — ABNORMAL LOW (ref 98–111)
Creatinine, Ser: 1.02 mg/dL (ref 0.61–1.24)
GFR, Estimated: >60 mL/min (ref >60)
Glucose, Bld: 310 mg/dL — ABNORMAL HIGH (ref 70–99)
Potassium: 3.6 mmol/L (ref 3.5–5.1)
Sodium: 134 mmol/L — ABNORMAL LOW (ref 135–145)

## 2022-06-06 LAB — URINALYSIS, ROUTINE W REFLEX MICROSCOPIC
Bacteria, UA: NONE SEEN
Bilirubin Urine: NEGATIVE
Glucose, UA: >=500 mg/dL — AB
Ketones, ur: 80 mg/dL — AB
Leukocytes,Ua: NEGATIVE
Nitrite: NEGATIVE
Protein, ur: 100 mg/dL — AB
Specific Gravity, Urine: 1.032 — ABNORMAL HIGH (ref 1.005–1.030)
pH: 5 (ref 5.0–8.0)

## 2022-06-06 LAB — HEPARIN LEVEL (UNFRACTIONATED)
Heparin Unfractionated: 0.12 IU/mL — ABNORMAL LOW (ref 0.30–0.70)
Heparin Unfractionated: 0.38 IU/mL (ref 0.30–0.70)
Heparin Unfractionated: 0.19 IU/mL — ABNORMAL LOW (ref 0.30–0.70)

## 2022-06-06 LAB — LIPID PANEL
Cholesterol: 144 mg/dL (ref 0–200)
HDL: 37 mg/dL — ABNORMAL LOW (ref >40)
LDL Cholesterol: 50 mg/dL (ref 0–99)
Total CHOL/HDL Ratio: 3.9 RATIO
Triglycerides: 285 mg/dL — ABNORMAL HIGH (ref <150)
VLDL: 57 mg/dL — ABNORMAL HIGH (ref 0–40)

## 2022-06-06 LAB — GLUCOSE, CAPILLARY
Glucose-Capillary: 406 mg/dL — ABNORMAL HIGH (ref 70–99)
Glucose-Capillary: 374 mg/dL — ABNORMAL HIGH (ref 70–99)
Glucose-Capillary: 395 mg/dL — ABNORMAL HIGH (ref 70–99)
Glucose-Capillary: 337 mg/dL — ABNORMAL HIGH (ref 70–99)
Glucose-Capillary: 279 mg/dL — ABNORMAL HIGH (ref 70–99)
Glucose-Capillary: 305 mg/dL — ABNORMAL HIGH (ref 70–99)
Glucose-Capillary: 239 mg/dL — ABNORMAL HIGH (ref 70–99)

## 2022-06-06 LAB — C-REACTIVE PROTEIN: CRP: 26.9 mg/dL — ABNORMAL HIGH (ref <1.0)

## 2022-06-06 LAB — BETA-HYDROXYBUTYRIC ACID: Beta-Hydroxybutyric Acid: 0.22 mmol/L (ref 0.05–0.27)

## 2022-06-06 SURGERY — LEFT HEART CATH AND CORS/GRAFTS ANGIOGRAPHY
Anesthesia: LOCAL

## 2022-06-06 MED ORDER — ATORVASTATIN CALCIUM 80 MG PO TABS
80.0000 mg | ORAL_TABLET | Freq: Every day | ORAL | Status: DC
  Administered 2022-06-06 – 2022-06-13 (×8): 80 mg via ORAL
  Filled 2022-06-06 (×8): qty 1

## 2022-06-06 MED ORDER — INSULIN ASPART 100 UNIT/ML IJ SOLN
3.0000 [IU] | Freq: Three times a day (TID) | INTRAMUSCULAR | Status: DC
  Administered 2022-06-06 – 2022-06-13 (×12): 3 [IU] via SUBCUTANEOUS

## 2022-06-06 MED ORDER — CARVEDILOL 12.5 MG PO TABS
12.5000 mg | ORAL_TABLET | Freq: Two times a day (BID) | ORAL | Status: DC
  Administered 2022-06-06 – 2022-06-13 (×14): 12.5 mg via ORAL
  Filled 2022-06-06 (×14): qty 1

## 2022-06-06 MED ORDER — INSULIN GLARGINE-YFGN 100 UNIT/ML ~~LOC~~ SOLN
60.0000 [IU] | Freq: Every day | SUBCUTANEOUS | Status: DC
  Administered 2022-06-06: 60 [IU] via SUBCUTANEOUS
  Filled 2022-06-06 (×2): qty 0.6

## 2022-06-06 MED ORDER — HEPARIN BOLUS VIA INFUSION
1500.0000 [IU] | Freq: Once | INTRAVENOUS | Status: AC
  Administered 2022-06-06: 1500 [IU] via INTRAVENOUS
  Filled 2022-06-06: qty 1500

## 2022-06-06 MED ORDER — HALOPERIDOL LACTATE 5 MG/ML IJ SOLN
2.0000 mg | Freq: Once | INTRAMUSCULAR | Status: AC | PRN
  Administered 2022-06-07: 2 mg via INTRAVENOUS
  Filled 2022-06-06: qty 1

## 2022-06-06 MED ORDER — BUPIVACAINE HCL (PF) 0.5 % IJ SOLN
10.0000 mL | Freq: Once | INTRAMUSCULAR | Status: AC
  Administered 2022-06-06: 10 mL
  Filled 2022-06-06: qty 10

## 2022-06-06 MED ORDER — INSULIN ASPART 100 UNIT/ML IJ SOLN
0.0000 [IU] | Freq: Three times a day (TID) | INTRAMUSCULAR | Status: DC
  Administered 2022-06-06: 11 [IU] via SUBCUTANEOUS
  Administered 2022-06-06: 7 [IU] via SUBCUTANEOUS
  Administered 2022-06-07: 15 [IU] via SUBCUTANEOUS
  Administered 2022-06-07: 7 [IU] via SUBCUTANEOUS
  Administered 2022-06-07 – 2022-06-08 (×3): 11 [IU] via SUBCUTANEOUS
  Administered 2022-06-08: 15 [IU] via SUBCUTANEOUS
  Administered 2022-06-09: 4 [IU] via SUBCUTANEOUS
  Administered 2022-06-09 – 2022-06-10 (×3): 7 [IU] via SUBCUTANEOUS
  Administered 2022-06-10 (×2): 4 [IU] via SUBCUTANEOUS
  Administered 2022-06-11: 7 [IU] via SUBCUTANEOUS
  Administered 2022-06-11: 3 [IU] via SUBCUTANEOUS
  Administered 2022-06-11 – 2022-06-12 (×3): 7 [IU] via SUBCUTANEOUS
  Administered 2022-06-12: 3 [IU] via SUBCUTANEOUS
  Administered 2022-06-13: 7 [IU] via SUBCUTANEOUS

## 2022-06-06 MED ORDER — CARVEDILOL 6.25 MG PO TABS
6.2500 mg | ORAL_TABLET | Freq: Once | ORAL | Status: AC
  Administered 2022-06-06: 6.25 mg via ORAL
  Filled 2022-06-06: qty 1

## 2022-06-06 MED ORDER — DOXYCYCLINE HYCLATE 100 MG PO TABS
100.0000 mg | ORAL_TABLET | Freq: Two times a day (BID) | ORAL | Status: DC
  Administered 2022-06-06: 100 mg via ORAL
  Filled 2022-06-06: qty 1

## 2022-06-06 MED ORDER — INSULIN GLARGINE-YFGN 100 UNIT/ML ~~LOC~~ SOLN
40.0000 [IU] | Freq: Every day | SUBCUTANEOUS | Status: DC
  Administered 2022-06-06: 40 [IU] via SUBCUTANEOUS
  Filled 2022-06-06 (×2): qty 0.4

## 2022-06-06 MED ORDER — INSULIN ASPART 100 UNIT/ML IJ SOLN: 0.0000 [IU] | Freq: Every day | INTRAMUSCULAR | Status: DC

## 2022-06-06 MED ORDER — INSULIN GLARGINE-YFGN 100 UNIT/ML ~~LOC~~ SOLN
50.0000 [IU] | Freq: Every day | SUBCUTANEOUS | Status: DC
  Filled 2022-06-06: qty 0.5

## 2022-06-06 MED ORDER — HEPARIN BOLUS VIA INFUSION
2000.0000 [IU] | Freq: Once | INTRAVENOUS | Status: AC
  Administered 2022-06-06: 2000 [IU] via INTRAVENOUS
  Filled 2022-06-06: qty 2000

## 2022-06-06 MED ORDER — INSULIN ASPART 100 UNIT/ML IJ SOLN
0.0000 [IU] | Freq: Every day | INTRAMUSCULAR | Status: DC
  Administered 2022-06-06 – 2022-06-08 (×3): 2 [IU] via SUBCUTANEOUS
  Administered 2022-06-09: 4 [IU] via SUBCUTANEOUS
  Administered 2022-06-10: 2 [IU] via SUBCUTANEOUS

## 2022-06-06 MED ORDER — CEFAZOLIN SODIUM-DEXTROSE 2-4 GM/100ML-% IV SOLN
2.0000 g | Freq: Three times a day (TID) | INTRAVENOUS | Status: DC
  Administered 2022-06-06 – 2022-06-13 (×22): 2 g via INTRAVENOUS
  Filled 2022-06-06 (×22): qty 100

## 2022-06-06 MED ORDER — INSULIN ASPART 100 UNIT/ML IJ SOLN
0.0000 [IU] | INTRAMUSCULAR | Status: DC
  Administered 2022-06-06: 3 [IU] via SUBCUTANEOUS
  Administered 2022-06-06: 15 [IU] via SUBCUTANEOUS

## 2022-06-06 MED ORDER — INSULIN ASPART 100 UNIT/ML IJ SOLN
15.0000 [IU] | Freq: Once | INTRAMUSCULAR | Status: AC
  Administered 2022-06-06: 15 [IU] via SUBCUTANEOUS

## 2022-06-06 NOTE — Assessment & Plan Note (Signed)
-  Patient with prior h/o CAD s/p CABG presenting with chest pain and uptrending troponin, consistent with NSTEMI -He developed fever overnight and TRH was consulted -Management of this issue per cardiology -He is on heparin drip

## 2022-06-06 NOTE — Consult Note (Signed)
Initial Consultation Note   Patient: George [name] PIR:[id number] DOB: [birth year] PCP: Kirstie Peri, MD DOA: 06/05/2022 DOS: the patient was seen and examined on 06/06/2022 Primary service: Runell Gess, MD  Referring physician: Roberts/Berry Reason for consult: Transferred from [facility] for NSTEMI, plan for cath.  Troponins in 100-200 range.  Glucose in 400s.  Had fever overnight to 102.8, complaining of neck pain.  Blood cultures pending, started Vanc//Zosyn.  Ordered MRI brain and C-spine MRI looking for infection - will hold on imaging for now.    Assessment and Plan: * NSTEMI (non-ST elevated myocardial infarction) (HCC) -Patient with prior h/o CAD s/p CABG presenting with chest pain and uptrending troponin, consistent with NSTEMI -He developed fever overnight and TRH was consulted -Management of this issue per cardiology -He is on heparin drip  Staphylococcus aureus bacteremia -Patient developed fever overnight >102 -He is confused -No nuchal rigidity so appears unlikely to be meningitis although LP could be considered -His only complaint is L shoulder pain; imaging unremarkable, no obvious effusion but will consult ortho for joint aspiration -BCID is growing Staph aureus -He did have a recent tick bite so will add doxy coverage -He was started on Vanc/Zosyn but based on BCID it appears to be appropriate to narrow to Rocephin; awaiting pharmacy input -Will also request ID consult based on bacteremia -Will need Echo and probably TEE  Mixed hyperlipidemia -Continue high-dose Lipitor  Uncontrolled type 2 diabetes mellitus with hyperglycemia (HCC) -Glucose consistently above 300 - which will not help wound healing regardless -He is currently on Semglee 50 - was taking Lantus 70 at home so will increase to 60 units -Continue resistant scale SSI  HTN (hypertension) -Continue amlodipine and carvedilol -He was also on Toprol XL and Catapres at home but these are on hold  currently       TRH will continue to follow the patient.  HPI: George [name] is a 77 y.o. male with past medical history of AAA, chronic pain, CAD s/p CABG, HTN, HLD, DM, chronic systolic CHF, and prostate CA who presented yesterday with chest pain.  Recent stress test on 05/08/22 showed prior infarction and was intermediate risk with EF 30-44%.  He was admitted with NSTEMI with plan for cardiac catheterization.  Overnight, he spiked a fever to 102.  His only complaint is L shoulder pain that radiates into his L neck and jaw and into his L chest.  He is clearly confused.  His wife does report that he was bitten by a tick on his upper back last week.      Review of Systems: As mentioned in the history of present illness. All other systems reviewed and are negative. Past Medical History:  Diagnosis Date   Ascending aortic aneurysm (HCC)    4.1 cm in 2019   Chronic back pain    Coronary artery disease    Status post CABG 2014 (LIMA to LAD, SVG to ramus, SVG to OM 2)   Dermatophytosis of the body    Eczema    Essential hypertension    GERD (gastroesophageal reflux disease)    Herpes zoster without mention of complication    History of bronchitis    History of claustrophobia    HOH (hard of hearing)    Mixed hyperlipidemia    Prostate cancer (HCC)    Psoriasis and similar disorder    Sinusitis    Type 2 diabetes mellitus (HCC)    Past Surgical History:  Procedure Laterality Date  CATARACT EXTRACTION W/PHACO Left 04/04/2014   Procedure: CATARACT EXTRACTION PHACO AND INTRAOCULAR LENS PLACEMENT (IOC);  Surgeon: Susa Simmonds, MD;  Location: [facility];  Service: Ophthalmology;  Laterality: Left;  CDE:4.49   CATARACT EXTRACTION W/PHACO Right 01/02/2015   Procedure: CATARACT EXTRACTION PHACO AND INTRAOCULAR LENS PLACEMENT; CDE:  6.36;  Surgeon: Susa Simmonds, MD;  Location: [facility];  Service: Ophthalmology;  Laterality: Right;   CIRCUMCISION  2010   CORONARY ARTERY BYPASS GRAFT N/A  08/13/2012   Procedure: CORONARY ARTERY BYPASS GRAFTING (CABG);  Surgeon: Kerin Perna, MD;  Location: [facility] OR;  Service: Open Heart Surgery;  Laterality: N/A;   ENDOVEIN HARVEST OF GREATER SAPHENOUS VEIN Right 08/13/2012   Procedure: ENDOVEIN HARVEST OF GREATER SAPHENOUS VEIN;  Surgeon: Kerin Perna, MD;  Location: [facility] OR;  Service: Open Heart Surgery;  Laterality: Right;   INTRAOPERATIVE TRANSESOPHAGEAL ECHOCARDIOGRAM N/A 08/13/2012   Procedure: INTRAOPERATIVE TRANSESOPHAGEAL ECHOCARDIOGRAM;  Surgeon: Kerin Perna, MD;  Location: [facility] OR;  Service: Open Heart Surgery;  Laterality: N/A;   LEFT HEART CATHETERIZATION WITH CORONARY ANGIOGRAM N/A 08/07/2012   Procedure: LEFT HEART CATHETERIZATION WITH CORONARY ANGIOGRAM;  Surgeon: Peter M Swaziland, MD;  Location: [facility] CATH LAB;  Service: Cardiovascular;  Laterality: N/A;   RADIOACTIVE SEED IMPLANT N/A 11/25/2017   Procedure: RADIOACTIVE SEED IMPLANT/BRACHYTHERAPY IMPLANT;  Surgeon: Bjorn Pippin, MD;  Location: [facility];  Service: Urology;  Laterality: N/A;   SEED IMPLANT DONE AT [place]  01/2017   Social History:  reports that he quit smoking about 55 years ago. His smoking use included cigarettes. He started smoking about 57 years ago. He has a 4.00 pack-year smoking history. He has never been exposed to tobacco smoke. He has never used smokeless tobacco. He reports that he does not drink alcohol and does not use drugs.  No Known Allergies  Family History  Problem Relation Age of Onset   CAD Other    Heart attack Other    Diabetes Other    Cataracts Mother    Amblyopia Neg Hx    Blindness Neg Hx    Glaucoma Neg Hx    Macular degeneration Neg Hx    Retinal detachment Neg Hx    Strabismus Neg Hx    Retinitis pigmentosa Neg Hx     Prior to Admission medications   Medication Sig Start Date End Date Taking? Authorizing Provider  amLODipine (NORVASC) 10 MG tablet Take 1 tablet (10 mg total) by mouth daily. 09/19/20  Yes Netta Neat., NP  atorvastatin (LIPITOR) 40 MG tablet Take 40 mg by mouth daily.   Yes [provider]  carvedilol (COREG) 6.25 MG tablet Take 1 tablet (6.25 mg total) by mouth 2 (two) times daily. 05/02/22  Yes Sharlene Dory, NP  cloNIDine (CATAPRES) 0.1 MG tablet Take 1 tablet by mouth daily. 10/21/14  Yes [provider]  insulin glargine (LANTUS) 100 UNIT/ML Solostar Pen Inject 70 Units into the skin at bedtime.   Yes [provider]  metoprolol succinate (TOPROL-XL) 100 MG 24 hr tablet Take 50 mg by mouth daily. Take with or immediately following a meal.   Yes [provider]  Multiple Vitamins-Minerals (ICAPS AREDS 2 PO) Take 1 capsule by mouth 2 (two) times daily.   Yes [provider]  nitroGLYCERIN (NITROSTAT) 0.4 MG SL tablet Place 1 tablet (0.4 mg total) under the tongue every 5 (five) minutes x 3 doses as needed for chest pain (if no relief after  3rd dose, proceed to ED for evaluation or call 911). 04/24/22  Yes Jonelle Sidle, MD  NOVOLOG FLEXPEN 100 UNIT/ML FlexPen INJECT 25-31 UNITS INTO THE SKIN THREE TIMES DAILY BEFORE MEALS Patient taking differently: Inject 50 Units into the skin 3 (three) times daily with meals. 09/18/17  Yes Roma Kayser, MD    Physical Exam: Vitals:   06/06/22 0119 06/06/22 0524 06/06/22 0745 06/06/22 1154  BP: 129/75 (!) 149/70 (!) 144/78 (!) 173/85  Pulse: (!) 109 (!) 106 (!) 110 (!) 128  Resp: 18 18 18  (!) 30  Temp: 97.7 F (36.5 C) 97.7 F (36.5 C) 98.6 F (37 C) 99.7 F (37.6 C)  TempSrc: Oral Oral Oral Oral  SpO2: 92% 91% 90% 93%  Weight:  107.4 kg    Height:       General:  Appears calm and comfortable but is confused and reluctant to move Eyes:  EOMI, normal lids, iris ENT:  grossly normal hearing, lips & tongue, mmm Neck:  no LAD, masses or thyromegaly; no nuchal rigidity, negative Kernig and Brudzinski signs Cardiovascular:  RR with mild tachycardia, no m/r/g. No LE edema.   Respiratory:   CTA bilaterally with no wheezes/rales/rhonchi.  Normal respiratory effort. Abdomen:  soft, NT, ND Skin:  no rash or induration seen on limited exam Musculoskeletal:  L shoulder with limited ROM due to pain but no obvious effusion; no TTP in neck or L shoulder girdle Psychiatric: confused mood and affect, speech limited, AOx1 (thinks he is at [facility], was eventually able to identify his wife) Neurologic:  CN 2-12 grossly intact, moves all extremities in coordinated fashion other than L shoulder limited by pain   Radiological Exams on Admission: Independently reviewed - see discussion in A/P where applicable  DG Shoulder Left Port  Result Date: 06/06/2022 CLINICAL DATA:  Fever.  Pain. EXAM: LEFT SHOULDER COMPARISON:  None Available. FINDINGS: Single frontal view of the left shoulder. Mild acromioclavicular joint space narrowing and peripheral osteophytosis. Mild inferior glenohumeral joint space narrowing and peripheral osteophytosis. No acute fracture is seen. No dislocation. Status post median sternotomy. IMPRESSION: Mild acromioclavicular and glenohumeral osteoarthritis. Electronically Signed   By: Neita Garnet M.D.   On: 06/06/2022 13:20   DG CHEST PORT 1 VIEW  Result Date: 06/05/2022 CLINICAL DATA:  Fever. EXAM: PORTABLE CHEST 1 VIEW COMPARISON:  Two-view chest x-ray 08/12/2018 FINDINGS: Heart is enlarged. No edema or effusion is present. No focal airspace disease is present. Mild degenerative changes are again noted in the shoulders. Visualized soft tissues and bony thorax are unremarkable. IMPRESSION: 1. Cardiomegaly without failure. 2. No acute cardiopulmonary disease. Electronically Signed   By: Marin Roberts M.D.   On: 06/05/2022 21:49    EKG: Independently reviewed.  Afib with rate 109; LBBB with nonspecific ST changes   Labs on Admission: I have personally reviewed the available labs and imaging studies at the time of the admission.  Pertinent labs:     Glucose 310 Lipids:  144/37/50/285 WBC 12.2 HS troponin (at [facility]): 120- 235- 556 BCID: + staph aureus   Family Communication: Wife was present throughout evaluation Primary team communication: I spoke with Laverda Page by telephone at the time of the consult and later via Secure Chat  Thank you very much for involving [place] in the care of your patient.  Author: Jonah Blue, MD 06/06/2022 1:57 PM  For on call review [email or web address].

## 2022-06-06 NOTE — Assessment & Plan Note (Signed)
Continue high-dose Lipitor 

## 2022-06-06 NOTE — Progress Notes (Addendum)
ANTICOAGULATION CONSULT NOTE   Pharmacy Consult for heparin Indication: chest pain/ACS  No Known Allergies  Patient Measurements: Height: 6\' 4"  (193 cm) Weight: 107.4 kg (236 lb 12.4 oz) IBW/kg (Calculated) : 86.8 Heparin Dosing Weight: 103.6  Vital Signs: Temp: 98.1 F (36.7 C) (05/23 1947) Temp Source: Oral (05/23 1947) BP: 156/87 (05/23 1947) Pulse Rate: 121 (05/23 1947)  Labs: Recent Labs    06/05/22 2226 06/06/22 0643 06/06/22 1006 06/06/22 1822  HGB 12.9* 12.6*  --   --   HCT 38.7* 37.2*  --   --   PLT 166 161  --   --   HEPARINUNFRC  --  0.12* 0.38 0.19*  CREATININE 1.05 1.02  --   --      Estimated Creatinine Clearance: 81.5 mL/min (by C-G formula based on SCr of 1.02 mg/dL).   Medical History: Past Medical History:  Diagnosis Date   Ascending aortic aneurysm (HCC)    4.1 cm in 2019   Chronic back pain    Coronary artery disease    Status post CABG 2014 (LIMA to LAD, SVG to ramus, SVG to OM 2)   Dermatophytosis of the body    Eczema    Essential hypertension    GERD (gastroesophageal reflux disease)    Herpes zoster without mention of complication    History of bronchitis    History of claustrophobia    HOH (hard of hearing)    Mixed hyperlipidemia    Prostate cancer (HCC)    Psoriasis and similar disorder    Sinusitis    Type 2 diabetes mellitus (HCC)     Medications:  Scheduled:   amLODipine  10 mg Oral Daily   aspirin EC  81 mg Oral Daily   atorvastatin  80 mg Oral Daily   carvedilol  12.5 mg Oral BID   insulin aspart  0-20 Units Subcutaneous TID WC   insulin aspart  0-5 Units Subcutaneous QHS   insulin aspart  3 Units Subcutaneous TID WC   insulin glargine-yfgn  60 Units Subcutaneous QHS   sodium chloride flush  3 mL Intravenous Q12H    Assessment: 77 yo male presented to [facility] with neck, back, chest, and abdominal pain. Notable PMH includes CAD s/p bypass graft. Tx to [facility] for cardiac cath.   -heparin level= 0.19 on 1850  units/hr. No issues with infusion or bleeding noted per RN.  Goal of Therapy:  Heparin level 0.3-0.7 units/ml Monitor platelets by anticoagulation protocol: Yes   Plan:  -Heparin bolus 1500 units x1 then increase to 2000 units/hr -Heparin level in 8 hours -Will follow plans post cath  Rexford Maus, PharmD, BCPS 06/06/2022 8:27 PM

## 2022-06-06 NOTE — TOC Benefit Eligibility Note (Signed)
Patient Advocate Encounter  Insurance verification completed.    The patient is currently admitted and upon discharge could be taking Entresto 24-26 mg.  The current 30 day co-pay is $47.00.   The patient is currently admitted and upon discharge could be taking Farxiga 10 mg.  The current 30 day co-pay is $47.00.   The patient is currently admitted and upon discharge could be taking Jardiance 10 mg.  The current 30 day co-pay is $47.00.   The patient is insured through [employer] Medicare Part D   This test claim was processed through [facility]- copay amounts may vary at other pharmacies due to pharmacy/plan contracts, or as the patient moves through the different stages of their insurance plan.  Cybele Maule, CPHT Pharmacy Patient Advocate Specialist [facility] Patient Advocate Team Direct Number: [phone]  Fax: [phone]       

## 2022-06-06 NOTE — Telephone Encounter (Signed)
Pharmacy Patient Advocate Encounter  Insurance verification completed.    The patient is insured through [employer] Medicare Part D   The patient is currently admitted and ran test claims for the following: Entresto, Farxiga, Jardiance.  Copays and coinsurance results were relayed to Inpatient clinical team.  

## 2022-06-06 NOTE — Consult Note (Signed)
[facility]    Date of Admission:  06/05/2022     Reason for Consult: MSSA bacteremia     Referring Physician: [facility] [employer] [facility]  Current antibiotics: Vanc Zosyn Doxy  ASSESSMENT:    77 y.o. male admitted with:  MSSA Bacteremia: Patient presenting with concern for NSTEMI found to have high-grade MSSA bacteremia of unclear source with concern for seeding of left shoulder and cervical/thoracic spine based on symptoms thus far. Blood cultures are positive for MSSA from 06/05/22.  TTE was completed for dyspnea outpatient on 05/28/22 that did not see any vegetations. Left shoulder pain: In setting of bacteremia, raises concern for possibility of septic arthritis. Neck pain: Additionally, raises concern for seeding for the cervical spine. NSTEMI: Troponins elevated in setting of sepsis and history of CAD s/p CABG 2014. Diabetes: A1c pending but sugars have been elevated.  Sepsis: Due to #1.  Tick bite: Red herring.  Presentation is due to MSSA.  RECOMMENDATIONS:    Stop vancomycin, pip tazo, and doxy Start Cefazolin 2 gm q8h Repeat blood cultures in the morning MRI cervical and thoracic spine Await orthopedics evaluation regarding left shoulder for aspiration to rule out septic arthritis If no evidence of septic arthritis in shoulder may consider imaging of sternum to ensure no involvement there to explain symptoms Await MRI cervical spine.  If no abscess or discitis/OM, probably needs TEE to rule out IE He is more alert and oriented right now.  Suspect confusion related more to fevers and sepsis.  If encephalopathy persists, low threshold for MRI brain Low suspicion for tick related infection and no further work up/treatment planned for now Glucose control, lab monitoring Discussed with teams Following   Principal Problem:   NSTEMI (non-ST elevated myocardial infarction) (HCC) Active Problems:   HTN (hypertension)   Uncontrolled type 2 diabetes  mellitus with hyperglycemia (HCC)   Mixed hyperlipidemia   Staphylococcus aureus bacteremia   MEDICATIONS:    Scheduled Meds:  amLODipine  10 mg Oral Daily   aspirin EC  81 mg Oral Daily   atorvastatin  80 mg Oral Daily   bupivacaine(PF)  10 mL Infiltration Once   carvedilol  12.5 mg Oral BID   insulin aspart  0-20 Units Subcutaneous TID WC   insulin aspart  0-5 Units Subcutaneous QHS   insulin aspart  3 Units Subcutaneous TID WC   insulin glargine-yfgn  60 Units Subcutaneous QHS   sodium chloride flush  3 mL Intravenous Q12H   Continuous Infusions:  sodium chloride     sodium chloride 1 mL/kg/hr (06/06/22 0420)    ceFAZolin (ANCEF) IV 2 g (06/06/22 1437)   heparin 1,850 Units/hr (06/06/22 1004)   PRN Meds:.sodium chloride, acetaminophen, nitroGLYCERIN, ondansetron (ZOFRAN) IV, sodium chloride flush  HPI:    George [name] is a 77 y.o. male with PMHx as noted below admitted 06/05/22 due to concern for chest pain and NSTEMI.  He also reported left shoulder pain and neck pain that began sometime last week.  The past few days he also reported fevers and chills.  Upon admission here he was started on a heparin gtt due to perceived chest pain, elevated troponin.  Other labs at admission also notable for WBC 10.4, platelets 161, creatinine 1.03, glucose 440.  Overnight, patient developed fever of 102.8 with tachycardia.  There was some confusion noted as well.  He had blood cultures obtained and was started empirically on vancomycin and Zosyn.  We have been asked to  see patient today due these blood cultures from 5/22 growing MSSA in 4 of 4 bottles of unclear sources.   Past Medical History:  Diagnosis Date   Ascending aortic aneurysm (HCC)    4.1 cm in 2019   Chronic back pain    Coronary artery disease    Status post CABG 2014 (LIMA to LAD, SVG to ramus, SVG to OM 2)   Dermatophytosis of the body    Eczema    Essential hypertension    GERD (gastroesophageal reflux disease)     Herpes zoster without mention of complication    History of bronchitis    History of claustrophobia    HOH (hard of hearing)    Mixed hyperlipidemia    Prostate cancer (HCC)    Psoriasis and similar disorder    Sinusitis    Type 2 diabetes mellitus (HCC)     Social History   Tobacco Use   Smoking status: Former    Packs/day: 1.00    Years: 4.00    Additional pack years: 0.00    Total pack years: 4.00    Types: Cigarettes    Start date: 03/04/1965    Quit date: 03/05/1967    Years since quitting: 55.2    Passive exposure: Never   Smokeless tobacco: Never  Vaping Use   Vaping Use: Never used  Substance Use Topics   Alcohol use: No    Alcohol/week: 0.0 standard drinks of alcohol   Drug use: No    Family History  Problem Relation Age of Onset   CAD Other    Heart attack Other    Diabetes Other    Cataracts Mother    Amblyopia Neg Hx    Blindness Neg Hx    Glaucoma Neg Hx    Macular degeneration Neg Hx    Retinal detachment Neg Hx    Strabismus Neg Hx    Retinitis pigmentosa Neg Hx     No Known Allergies  Review of Systems  Constitutional:  Positive for fever and malaise/fatigue.  Respiratory: Negative.    Gastrointestinal: Negative.   Musculoskeletal:  Positive for joint pain and neck pain.  All other systems reviewed and are negative.   OBJECTIVE:   Blood pressure (!) 173/85, pulse (!) 128, temperature 99.7 F (37.6 C), temperature source Oral, resp. rate (!) 30, height 6\' 4"  (1.93 m), weight 107.4 kg, SpO2 93 %. Body mass index is 28.82 kg/m.  Physical Exam Constitutional:      Appearance: Normal appearance. He is ill-appearing.  HENT:     Head: Normocephalic and atraumatic.  Eyes:     Extraocular Movements: Extraocular movements intact.     Conjunctiva/sclera: Conjunctivae normal.  Cardiovascular:     Pulses: Normal pulses.  Pulmonary:     Effort: Pulmonary effort is normal. No respiratory distress.  Abdominal:     General: There is no  distension.     Palpations: Abdomen is soft.  Musculoskeletal:     Cervical back: Normal range of motion and neck supple.     Comments: He has neck pain with ROM and left shoulder pain with ROM.   Skin:    General: Skin is warm and dry.     Comments: Areas of eczema/psoriasis noted on lower legs.   Neurological:     General: No focal deficit present.     Mental Status: He is alert and oriented to person, place, and time.  Psychiatric:        Mood and Affect:  Mood normal.        Behavior: Behavior normal.      Lab Results: Lab Results  Component Value Date   WBC 12.2 (H) 06/06/2022   HGB 12.6 (L) 06/06/2022   HCT 37.2 (L) 06/06/2022   MCV 79.7 (L) 06/06/2022   PLT 161 06/06/2022    Lab Results  Component Value Date   NA 134 (L) 06/06/2022   K 3.6 06/06/2022   CO2 26 06/06/2022   GLUCOSE 310 (H) 06/06/2022   BUN 21 06/06/2022   CREATININE 1.02 06/06/2022   CALCIUM 8.8 (L) 06/06/2022   GFRNONAA >60 06/06/2022   GFRAA >60 08/11/2018    Lab Results  Component Value Date   ALT 35 08/11/2018   AST 29 08/11/2018   ALKPHOS 122 08/11/2018   BILITOT 0.6 08/11/2018    No results found for: "CRP"  No results found for: "ESRSEDRATE"  I have reviewed the micro and lab results in Epic.  Imaging: DG Shoulder Left Port  Result Date: 06/06/2022 CLINICAL DATA:  Fever.  Pain. EXAM: LEFT SHOULDER COMPARISON:  None Available. FINDINGS: Single frontal view of the left shoulder. Mild acromioclavicular joint space narrowing and peripheral osteophytosis. Mild inferior glenohumeral joint space narrowing and peripheral osteophytosis. No acute fracture is seen. No dislocation. Status post median sternotomy. IMPRESSION: Mild acromioclavicular and glenohumeral osteoarthritis. Electronically Signed   By: Neita Garnet M.D.   On: 06/06/2022 13:20   DG CHEST PORT 1 VIEW  Result Date: 06/05/2022 CLINICAL DATA:  Fever. EXAM: PORTABLE CHEST 1 VIEW COMPARISON:  Two-view chest x-ray 08/12/2018  FINDINGS: Heart is enlarged. No edema or effusion is present. No focal airspace disease is present. Mild degenerative changes are again noted in the shoulders. Visualized soft tissues and bony thorax are unremarkable. IMPRESSION: 1. Cardiomegaly without failure. 2. No acute cardiopulmonary disease. Electronically Signed   By: Marin Roberts M.D.   On: 06/05/2022 21:49     Imaging independently reviewed in Epic.  Vedia Coffer for Infectious Disease [facility] [phone] pager 06/06/2022, 2:50 PM

## 2022-06-06 NOTE — Assessment & Plan Note (Signed)
-  Glucose consistently above 300 - which will not help wound healing regardless -He is currently on Semglee 50 - was taking Lantus 70 at home so will increase to 60 units -Continue resistant scale SSI

## 2022-06-06 NOTE — Consult Note (Signed)
Reason for Consult:Left shoulder pain Referring Physician: Jonah Blue Time called: 1336 Time at bedside: 1450   George [name] is an 77 y.o. male.  HPI: George [name] was admitted yesterday with CP. He had a fever overnight and was positive for bacteremia. He says his left shoulder has been hurting for about 4d. He denies any antecedent event or prior e/o similar. RN reports he is a bit confused so history may be inaccurate.  Past Medical History:  Diagnosis Date   Ascending aortic aneurysm (HCC)    4.1 cm in 2019   Chronic back pain    Coronary artery disease    Status post CABG 2014 (LIMA to LAD, SVG to ramus, SVG to OM 2)   Dermatophytosis of the body    Eczema    Essential hypertension    GERD (gastroesophageal reflux disease)    Herpes zoster without mention of complication    History of bronchitis    History of claustrophobia    HOH (hard of hearing)    Mixed hyperlipidemia    Prostate cancer (HCC)    Psoriasis and similar disorder    Sinusitis    Type 2 diabetes mellitus (HCC)     Past Surgical History:  Procedure Laterality Date   CATARACT EXTRACTION W/PHACO Left 04/04/2014   Procedure: CATARACT EXTRACTION PHACO AND INTRAOCULAR LENS PLACEMENT (IOC);  Surgeon: Susa Simmonds, MD;  Location: [facility];  Service: Ophthalmology;  Laterality: Left;  CDE:4.49   CATARACT EXTRACTION W/PHACO Right 01/02/2015   Procedure: CATARACT EXTRACTION PHACO AND INTRAOCULAR LENS PLACEMENT; CDE:  6.36;  Surgeon: Susa Simmonds, MD;  Location: [facility];  Service: Ophthalmology;  Laterality: Right;   CIRCUMCISION  2010   CORONARY ARTERY BYPASS GRAFT N/A 08/13/2012   Procedure: CORONARY ARTERY BYPASS GRAFTING (CABG);  Surgeon: Kerin Perna, MD;  Location: [facility] OR;  Service: Open Heart Surgery;  Laterality: N/A;   ENDOVEIN HARVEST OF GREATER SAPHENOUS VEIN Right 08/13/2012   Procedure: ENDOVEIN HARVEST OF GREATER SAPHENOUS VEIN;  Surgeon: Kerin Perna, MD;  [facility]: [facility] OR;  Service: Open Heart  Surgery;  Laterality: Right;   INTRAOPERATIVE TRANSESOPHAGEAL ECHOCARDIOGRAM N/A 08/13/2012   Procedure: INTRAOPERATIVE TRANSESOPHAGEAL ECHOCARDIOGRAM;  Surgeon: Kerin Perna, MD;  Location: [facility] OR;  Service: Open Heart Surgery;  Laterality: N/A;   LEFT HEART CATHETERIZATION WITH CORONARY ANGIOGRAM N/A 08/07/2012   Procedure: LEFT HEART CATHETERIZATION WITH CORONARY ANGIOGRAM;  Surgeon: Peter M Swaziland, MD;  Location: [facility] CATH LAB;  Service: Cardiovascular;  Laterality: N/A;   RADIOACTIVE SEED IMPLANT N/A 11/25/2017   Procedure: RADIOACTIVE SEED IMPLANT/BRACHYTHERAPY IMPLANT;  Surgeon: Bjorn Pippin, MD;  Location: [facility];  Service: Urology;  Laterality: N/A;   SEED IMPLANT DONE AT [place]  01/2017    Family History  Problem Relation Age of Onset   CAD Other    Heart attack Other    Diabetes Other    Cataracts Mother    Amblyopia Neg Hx    Blindness Neg Hx    Glaucoma Neg Hx    Macular degeneration Neg Hx    Retinal detachment Neg Hx    Strabismus Neg Hx    Retinitis pigmentosa Neg Hx     Social History:  reports that he quit smoking about 55 years ago. His smoking use included cigarettes. He started smoking about 57 years ago. He has a 4.00 pack-year smoking history. He has never been exposed to tobacco smoke. He has never used smokeless tobacco. He reports that  he does not drink alcohol and does not use drugs.  Allergies: No Known Allergies  Medications: I have reviewed the patient's current medications.  Results for orders placed or performed during the hospital encounter of 06/05/22 (from the past 48 hour(s))  Glucose, capillary     Status: Abnormal   Collection Time: 06/05/22  5:55 PM  Result Value Ref Range   Glucose-Capillary 387 (H) 70 - 99 mg/dL    Comment: Glucose reference range applies only to samples taken after fasting for at least 8 hours.  Heparin level (unfractionated)     Status: Abnormal   Collection Time: 06/05/22  8:55 PM  Result Value  Ref Range   Heparin Unfractionated 0.13 (L) 0.30 - 0.70 IU/mL    Comment: (NOTE) The clinical reportable range upper limit is being lowered to >1.10 to align with the FDA approved guidance for the current laboratory assay.  If heparin results are below expected values, and patient dosage has  been confirmed, suggest follow up testing of antithrombin III levels. Performed at [facility] Lab, [address], [place], [place] [address]   Glucose, capillary     Status: Abnormal   Collection Time: 06/05/22  9:02 PM  Result Value Ref Range   Glucose-Capillary 366 (H) 70 - 99 mg/dL    Comment: Glucose reference range applies only to samples taken after fasting for at least 8 hours.  Urinalysis, Routine w reflex microscopic -Urine, Clean Catch     Status: Abnormal   Collection Time: 06/05/22  9:35 PM  Result Value Ref Range   Color, Urine YELLOW YELLOW   APPearance CLEAR CLEAR   Specific Gravity, Urine 1.032 (H) 1.005 - 1.030   pH 5.0 5.0 - 8.0   Glucose, UA >=500 (A) NEGATIVE mg/dL   Hgb urine dipstick SMALL (A) NEGATIVE   Bilirubin Urine NEGATIVE NEGATIVE   Ketones, ur 80 (A) NEGATIVE mg/dL   Protein, ur 191 (A) NEGATIVE mg/dL   Nitrite NEGATIVE NEGATIVE   Leukocytes,Ua NEGATIVE NEGATIVE   RBC / HPF 0-5 0 - 5 RBC/hpf   WBC, UA 0-5 0 - 5 WBC/hpf   Bacteria, UA NONE SEEN NONE SEEN   Squamous Epithelial / HPF 0-5 0 - 5 /HPF   Mucus PRESENT     Comment: Performed at [facility] Lab, [address] [place], [place] [address]  Culture, blood (Routine X 2) w Reflex to ID Panel     Status: None (Preliminary result)   Collection Time: 06/05/22 10:26 PM   Specimen: BLOOD  Result Value Ref Range   Specimen Description BLOOD BLOOD LEFT HAND    Special Requests NONE    Culture  Setup Time      GRAM POSITIVE COCCI IN CLUSTERS IN BOTH AEROBIC AND ANAEROBIC BOTTLES CRITICAL VALUE NOTED.  VALUE IS CONSISTENT WITH PREVIOUSLY REPORTED AND CALLED VALUE. Performed at [facility]  Lab, [address] [place], [place] [address]    Culture GRAM POSITIVE COCCI    Report Status PENDING   Culture, blood (Routine X 2) w Reflex to ID Panel     Status: None (Preliminary result)   Collection Time: 06/05/22 10:26 PM   Specimen: BLOOD  Result Value Ref Range   Specimen Description BLOOD BLOOD RIGHT HAND    Special Requests      BOTTLES DRAWN AEROBIC AND ANAEROBIC Blood Culture adequate volume   Culture  Setup Time      GRAM POSITIVE COCCI IN CLUSTERS IN BOTH AEROBIC AND  ANAEROBIC BOTTLES CRITICAL RESULT CALLED TO, READ BACK BY AND VERIFIED WITH: PHARMD A PAYTES [id number] AT 1325 BY CM Performed at [facility] Lab, [address], [place], [place] [address]    Culture GRAM POSITIVE COCCI    Report Status PENDING   Basic metabolic panel     Status: Abnormal   Collection Time: 06/05/22 10:26 PM  Result Value Ref Range   Sodium 131 (L) 135 - 145 mmol/L   Potassium 4.0 3.5 - 5.1 mmol/L   Chloride 94 (L) 98 - 111 mmol/L   CO2 22 22 - 32 mmol/L   Glucose, Bld 398 (H) 70 - 99 mg/dL    Comment: Glucose reference range applies only to samples taken after fasting for at least 8 hours.   BUN 17 8 - 23 mg/dL   Creatinine, Ser 9.81 0.61 - 1.24 mg/dL   Calcium 8.8 (L) 8.9 - 10.3 mg/dL   GFR, Estimated >19 >14 mL/min    Comment: (NOTE) Calculated using the CKD-EPI Creatinine Equation (2021)    Anion gap 15 5 - 15    Comment: Performed at [facility] Lab, [address], [place], [place] [address]  CBC with Differential/Platelet     Status: Abnormal   Collection Time: 06/05/22 10:26 PM  Result Value Ref Range   WBC 13.0 (H) 4.0 - 10.5 K/uL   RBC 4.78 4.22 - 5.81 MIL/uL   Hemoglobin 12.9 (L) 13.0 - 17.0 g/dL   HCT 62.1 (L) 30.8 - 65.7 %   MCV 81.0 80.0 - 100.0 fL   MCH 27.0 26.0 - 34.0 pg   MCHC 33.3 30.0 - 36.0 g/dL   RDW 84.6 96.2 - 95.2 %   Platelets 166 150 - 400 K/uL   nRBC 0.0 0.0 - 0.2 %   Neutrophils Relative % 87 %   Neutro Abs 11.3 (H) 1.7 - 7.7 K/uL    Lymphocytes Relative 3 %   Lymphs Abs 0.4 (L) 0.7 - 4.0 K/uL   Monocytes Relative 9 %   Monocytes Absolute 1.1 (H) 0.1 - 1.0 K/uL   Eosinophils Relative 0 %   Eosinophils Absolute 0.0 0.0 - 0.5 K/uL   Basophils Relative 0 %   Basophils Absolute 0.0 0.0 - 0.1 K/uL   Immature Granulocytes 1 %   Abs Immature Granulocytes 0.09 (H) 0.00 - 0.07 K/uL    Comment: Performed at [facility] Lab, [address], [place], [place] [address]  Blood Culture ID Panel (Reflexed)     Status: Abnormal   Collection Time: 06/05/22 10:26 PM  Result Value Ref Range   Enterococcus faecalis NOT DETECTED NOT DETECTED   Enterococcus Faecium NOT DETECTED NOT DETECTED   Listeria monocytogenes NOT DETECTED NOT DETECTED   Staphylococcus species DETECTED (A) NOT DETECTED    Comment: CRITICAL RESULT CALLED TO, READ BACK BY AND VERIFIED WITH: PHARMD A PAYTES [id number] AT 1325 BY CM    Staphylococcus aureus (BCID) DETECTED (A) NOT DETECTED    Comment: CRITICAL RESULT CALLED TO, READ BACK BY AND VERIFIED WITH: PHARMD A PAYTES [id number] AT 1325 BY CM    Staphylococcus epidermidis NOT DETECTED NOT DETECTED   Staphylococcus lugdunensis NOT DETECTED NOT DETECTED   Streptococcus species NOT DETECTED NOT DETECTED   Streptococcus agalactiae NOT DETECTED NOT DETECTED   Streptococcus pneumoniae NOT DETECTED NOT DETECTED   Streptococcus pyogenes NOT DETECTED NOT DETECTED   A.calcoaceticus-baumannii NOT DETECTED NOT DETECTED   Bacteroides fragilis NOT DETECTED NOT DETECTED   Enterobacterales NOT  DETECTED NOT DETECTED   Enterobacter cloacae complex NOT DETECTED NOT DETECTED   Escherichia coli NOT DETECTED NOT DETECTED   Klebsiella aerogenes NOT DETECTED NOT DETECTED   Klebsiella oxytoca NOT DETECTED NOT DETECTED   Klebsiella pneumoniae NOT DETECTED NOT DETECTED   Proteus species NOT DETECTED NOT DETECTED   Salmonella species NOT DETECTED NOT DETECTED   Serratia marcescens NOT DETECTED NOT DETECTED   Haemophilus influenzae NOT  DETECTED NOT DETECTED   Neisseria meningitidis NOT DETECTED NOT DETECTED   Pseudomonas aeruginosa NOT DETECTED NOT DETECTED   Stenotrophomonas maltophilia NOT DETECTED NOT DETECTED   Candida albicans NOT DETECTED NOT DETECTED   Candida auris NOT DETECTED NOT DETECTED   Candida glabrata NOT DETECTED NOT DETECTED   Candida krusei NOT DETECTED NOT DETECTED   Candida parapsilosis NOT DETECTED NOT DETECTED   Candida tropicalis NOT DETECTED NOT DETECTED   Cryptococcus neoformans/gattii NOT DETECTED NOT DETECTED   Meth resistant mecA/C and MREJ NOT DETECTED NOT DETECTED    Comment: Performed at [facility], [address] [place], [place] [address]  Glucose, capillary     Status: Abnormal   Collection Time: 06/06/22  1:04 AM  Result Value Ref Range   Glucose-Capillary 406 (H) 70 - 99 mg/dL    Comment: Glucose reference range applies only to samples taken after fasting for at least 8 hours.  Glucose, capillary     Status: Abnormal   Collection Time: 06/06/22  2:33 AM  Result Value Ref Range   Glucose-Capillary 374 (H) 70 - 99 mg/dL    Comment: Glucose reference range applies only to samples taken after fasting for at least 8 hours.  Glucose, capillary     Status: Abnormal   Collection Time: 06/06/22  4:06 AM  Result Value Ref Range   Glucose-Capillary 395 (H) 70 - 99 mg/dL    Comment: Glucose reference range applies only to samples taken after fasting for at least 8 hours.  Glucose, capillary     Status: Abnormal   Collection Time: 06/06/22  5:59 AM  Result Value Ref Range   Glucose-Capillary 337 (H) 70 - 99 mg/dL    Comment: Glucose reference range applies only to samples taken after fasting for at least 8 hours.  Basic metabolic panel     Status: Abnormal   Collection Time: 06/06/22  6:43 AM  Result Value Ref Range   Sodium 134 (L) 135 - 145 mmol/L   Potassium 3.6 3.5 - 5.1 mmol/L   Chloride 97 (L) 98 - 111 mmol/L   CO2 26 22 - 32 mmol/L   Glucose, Bld 310 (H) 70 - 99 mg/dL     Comment: Glucose reference range applies only to samples taken after fasting for at least 8 hours.   BUN 21 8 - 23 mg/dL   Creatinine, Ser 6.04 0.61 - 1.24 mg/dL   Calcium 8.8 (L) 8.9 - 10.3 mg/dL   GFR, Estimated >54 >09 mL/min    Comment: (NOTE) Calculated using the CKD-EPI Creatinine Equation (2021)    Anion gap 11 5 - 15    Comment: Performed at [facility], [address] [place], [place] [address]  Lipid panel     Status: Abnormal   Collection Time: 06/06/22  6:43 AM  Result Value Ref Range   Cholesterol 144 0 - 200 mg/dL   Triglycerides 478 (H) <150 mg/dL   HDL 37 (L) >29 mg/dL   Total CHOL/HDL Ratio 3.9 RATIO   VLDL 57 (H) 0 -  40 mg/dL   LDL Cholesterol 50 0 - 99 mg/dL    Comment:        Total Cholesterol/HDL:CHD Risk Coronary Heart Disease Risk Table                     Men   Women  1/2 Average Risk   3.4   3.3  Average Risk       5.0   4.4  2 X Average Risk   9.6   7.1  3 X Average Risk  23.4   11.0        Use the calculated Patient Ratio above and the CHD Risk Table to determine the patient's CHD Risk.        ATP III CLASSIFICATION (LDL):  <100     mg/dL   Optimal  409-811  mg/dL   Near or Above                    Optimal  130-159  mg/dL   Borderline  914-782  mg/dL   High  >956     mg/dL   Very High Performed at [facility], [address], [place], [place] [address]   CBC     Status: Abnormal   Collection Time: 06/06/22  6:43 AM  Result Value Ref Range   WBC 12.2 (H) 4.0 - 10.5 K/uL   RBC 4.67 4.22 - 5.81 MIL/uL   Hemoglobin 12.6 (L) 13.0 - 17.0 g/dL   HCT 65.7 (L) 84.6 - 96.2 %   MCV 79.7 (L) 80.0 - 100.0 fL   MCH 27.0 26.0 - 34.0 pg   MCHC 33.9 30.0 - 36.0 g/dL   RDW 95.2 84.1 - 32.4 %   Platelets 161 150 - 400 K/uL   nRBC 0.0 0.0 - 0.2 %    Comment: Performed at [facility], [address], [place], [place] [address]  Heparin level (unfractionated)     Status: Abnormal   Collection Time: 06/06/22  6:43 AM  Result Value  Ref Range   Heparin Unfractionated 0.12 (L) 0.30 - 0.70 IU/mL    Comment: (NOTE) The clinical reportable range upper limit is being lowered to >1.10 to align with the FDA approved guidance for the current laboratory assay.  If heparin results are below expected values, and patient dosage has  been confirmed, suggest follow up testing of antithrombin III levels. Performed at [facility], [address], [place], [place] [address]   Beta-hydroxybutyric acid     Status: None   Collection Time: 06/06/22  6:43 AM  Result Value Ref Range   Beta-Hydroxybutyric Acid 0.22 0.05 - 0.27 mmol/L    Comment: Performed at [facility], [address] [place], [place] [address]  Glucose, capillary     Status: Abnormal   Collection Time: 06/06/22  9:42 AM  Result Value Ref Range   Glucose-Capillary 279 (H) 70 - 99 mg/dL    Comment: Glucose reference range applies only to samples taken after fasting for at least 8 hours.  Heparin level (unfractionated)     Status: None   Collection Time: 06/06/22 10:06 AM  Result Value Ref Range   Heparin Unfractionated 0.38 0.30 - 0.70 IU/mL    Comment: (NOTE) The clinical reportable range upper limit is being lowered to >1.10 to align with the FDA approved guidance for the current laboratory assay.  If heparin results are below expected values, and patient dosage  has  been confirmed, suggest follow up testing of antithrombin III levels. Performed at [facility], [address] [place], [place] [address]   Glucose, capillary     Status: Abnormal   Collection Time: 06/06/22 11:45 AM  Result Value Ref Range   Glucose-Capillary 305 (H) 70 - 99 mg/dL    Comment: Glucose reference range applies only to samples taken after fasting for at least 8 hours.    DG Shoulder Left Port  Result Date: 06/06/2022 CLINICAL DATA:  Fever.  Pain. EXAM: LEFT SHOULDER COMPARISON:  None Available. FINDINGS: Single frontal view of the left shoulder. Mild  acromioclavicular joint space narrowing and peripheral osteophytosis. Mild inferior glenohumeral joint space narrowing and peripheral osteophytosis. No acute fracture is seen. No dislocation. Status post median sternotomy. IMPRESSION: Mild acromioclavicular and glenohumeral osteoarthritis. Electronically Signed   By: Neita Garnet M.D.   On: 06/06/2022 13:20   DG CHEST PORT 1 VIEW  Result Date: 06/05/2022 CLINICAL DATA:  Fever. EXAM: PORTABLE CHEST 1 VIEW COMPARISON:  Two-view chest x-ray 08/12/2018 FINDINGS: Heart is enlarged. No edema or effusion is present. No focal airspace disease is present. Mild degenerative changes are again noted in the shoulders. Visualized soft tissues and bony thorax are unremarkable. IMPRESSION: 1. Cardiomegaly without failure. 2. No acute cardiopulmonary disease. Electronically Signed   By: Marin Roberts M.D.   On: 06/05/2022 21:49    Review of Systems  HENT:  Negative for ear discharge, ear pain, hearing loss and tinnitus.   Eyes:  Negative for photophobia and pain.  Respiratory:  Negative for cough and shortness of breath.   Cardiovascular:  Negative for chest pain.  Gastrointestinal:  Negative for abdominal pain, nausea and vomiting.  Genitourinary:  Negative for dysuria, flank pain, frequency and urgency.  Musculoskeletal:  Positive for arthralgias (Left shoulder). Negative for back pain, myalgias and neck pain.  Neurological:  Negative for dizziness and headaches.  Hematological:  Does not bruise/bleed easily.  Psychiatric/Behavioral:  The patient is not nervous/anxious.    Blood pressure (!) 173/85, pulse (!) 128, temperature 99.7 F (37.6 C), temperature source Oral, resp. rate (!) 30, height 6\' 4"  (1.93 m), weight 107.4 kg, SpO2 93 %. Physical Exam Constitutional:      General: He is not in acute distress.    Appearance: He is well-developed. He is not diaphoretic.  HENT:     Head: Normocephalic and atraumatic.  Eyes:     General: No scleral  icterus.       Right eye: No discharge.        Left eye: No discharge.     Conjunctiva/sclera: Conjunctivae normal.  Cardiovascular:     Rate and Rhythm: Normal rate and regular rhythm.  Pulmonary:     Effort: Pulmonary effort is normal. No respiratory distress.  Musculoskeletal:     Cervical back: Normal range of motion.     Comments: Left shoulder, elbow, wrist, digits- no skin wounds, mild diffuse TTP shoulder, essentially painless AROM to 80 degrees flex/abd but significant pain past that, no instability, no blocks to motion  Sens  Ax/R/M/U intact  Mot   Ax/ R/ PIN/ M/ AIN/ U intact  Rad 2+  Skin:    General: Skin is warm and dry.  Neurological:     Mental Status: He is alert.  Psychiatric:        Mood and Affect: Mood normal.        Behavior: Behavior normal.     Assessment/Plan: Left shoulder  pain -- Dry tap and given good degree of motion does not seem c/w septic joint. Would suspect some cuff pathology given exam. Will get MRI. No motion or WB restrictions at this point.    Freeman Caldron, PA-C Orthopedic Surgery [phone] 06/06/2022, 3:15 PM

## 2022-06-06 NOTE — Progress Notes (Addendum)
ANTICOAGULATION CONSULT NOTE   Pharmacy Consult for heparin Indication: chest pain/ACS  No Known Allergies  Patient Measurements: Height: 6\' 4"  (193 cm) Weight: 107.4 kg (236 lb 12.4 oz) IBW/kg (Calculated) : 86.8 Heparin Dosing Weight: 103.6  Vital Signs: Temp: 98.6 F (37 C) (05/23 0745) Temp Source: Oral (05/23 0745) BP: 144/78 (05/23 0745) Pulse Rate: 110 (05/23 0745)  Labs: Recent Labs    06/05/22 2055 06/05/22 2226 06/06/22 0643  HGB  --  12.9* 12.6*  HCT  --  38.7* 37.2*  PLT  --  166 161  HEPARINUNFRC 0.13*  --  0.12*  CREATININE  --  1.05 1.02    Estimated Creatinine Clearance: 81.5 mL/min (by C-G formula based on SCr of 1.02 mg/dL).   Medical History: Past Medical History:  Diagnosis Date   Ascending aortic aneurysm (HCC)    4.1 cm in 2019   Chronic back pain    Coronary artery disease    Status post CABG 2014 (LIMA to LAD, SVG to ramus, SVG to OM 2)   Dermatophytosis of the body    Eczema    Essential hypertension    GERD (gastroesophageal reflux disease)    Herpes zoster without mention of complication    History of bronchitis    History of claustrophobia    HOH (hard of hearing)    Mixed hyperlipidemia    Prostate cancer (HCC)    Psoriasis and similar disorder    Sinusitis    Type 2 diabetes mellitus (HCC)     Medications:  Scheduled:   amLODipine  10 mg Oral Daily   aspirin EC  81 mg Oral Daily   carvedilol  6.25 mg Oral BID   insulin aspart  0-15 Units Subcutaneous Q4H   insulin glargine-yfgn  40 Units Subcutaneous QHS   sodium chloride flush  3 mL Intravenous Q12H    Assessment: 77 yo male [facility] to [facility] with neck, back, chest, and abdominal pain. Notable PMH includes CAD s/p bypass graft. Tx to [facility] for cardiac cath.   -heparin level= 0.12 on 1550 units/hr  Goal of Therapy:  Heparin level 0.3-0.7 units/ml Monitor platelets by anticoagulation protocol: Yes   Plan:  -Heparin bolus 2000 units x1 then increase to  1850 units/hr -Will follow plans post cath  Harland German, PharmD Clinical Pharmacist **Pharmacist phone directory can now be found on [email or web address] ([email or web address]).  Listed under [facility].

## 2022-06-06 NOTE — Progress Notes (Signed)
PHARMACY - PHYSICIAN COMMUNICATION CRITICAL VALUE ALERT - BLOOD CULTURE IDENTIFICATION (BCID)   Assessment:  77 YOM p/w chest pain, neck, and back pain.. Reported a tick bite ~1.5 weeks ago. Tm 102.8. On vanc/Zosyn/doxy. Mild thickening of aortic valve on TTE.    5/22 Bcx with 4/4 GPCs in clusters- Identified as Staphylococcus aureus on BCID- no MecA detected.   Name of physician (or Provider) Contacted: Drs. Drema Pry   Current antibiotics: Vancomycin + Zosyn + doxycycline   Changes to prescribed antibiotics recommended: STOP vancomycin, zosyn and doxycyline; START cefazolin 2g IV Q8H  Recommendations accepted by provider  Results for orders placed or performed during the hospital encounter of 06/05/22  Blood Culture ID Panel (Reflexed) (Collected: 06/05/2022 10:26 PM)  Result Value Ref Range   Enterococcus faecalis NOT DETECTED NOT DETECTED   Enterococcus Faecium NOT DETECTED NOT DETECTED   Listeria monocytogenes NOT DETECTED NOT DETECTED   Staphylococcus species DETECTED (A) NOT DETECTED   Staphylococcus aureus (BCID) DETECTED (A) NOT DETECTED   Staphylococcus epidermidis NOT DETECTED NOT DETECTED   Staphylococcus lugdunensis NOT DETECTED NOT DETECTED   Streptococcus species NOT DETECTED NOT DETECTED   Streptococcus agalactiae NOT DETECTED NOT DETECTED   Streptococcus pneumoniae NOT DETECTED NOT DETECTED   Streptococcus pyogenes NOT DETECTED NOT DETECTED   A.calcoaceticus-baumannii NOT DETECTED NOT DETECTED   Bacteroides fragilis NOT DETECTED NOT DETECTED   Enterobacterales NOT DETECTED NOT DETECTED   Enterobacter cloacae complex NOT DETECTED NOT DETECTED   Escherichia coli NOT DETECTED NOT DETECTED   Klebsiella aerogenes NOT DETECTED NOT DETECTED   Klebsiella oxytoca NOT DETECTED NOT DETECTED   Klebsiella pneumoniae NOT DETECTED NOT DETECTED   Proteus species NOT DETECTED NOT DETECTED   Salmonella species NOT DETECTED NOT DETECTED   Serratia marcescens NOT DETECTED  NOT DETECTED   Haemophilus influenzae NOT DETECTED NOT DETECTED   Neisseria meningitidis NOT DETECTED NOT DETECTED   Pseudomonas aeruginosa NOT DETECTED NOT DETECTED   Stenotrophomonas maltophilia NOT DETECTED NOT DETECTED   Candida albicans NOT DETECTED NOT DETECTED   Candida auris NOT DETECTED NOT DETECTED   Candida glabrata NOT DETECTED NOT DETECTED   Candida krusei NOT DETECTED NOT DETECTED   Candida parapsilosis NOT DETECTED NOT DETECTED   Candida tropicalis NOT DETECTED NOT DETECTED   Cryptococcus neoformans/gattii NOT DETECTED NOT DETECTED   Meth resistant mecA/C and MREJ NOT DETECTED NOT DETECTED    Jani Gravel, PharmD PGY-2 Infectious Diseases Resident  06/06/2022 1:32 PM

## 2022-06-06 NOTE — Assessment & Plan Note (Signed)
-  Continue amlodipine and carvedilol -He was also on Toprol XL and Catapres at home but these are on hold currently

## 2022-06-06 NOTE — Progress Notes (Addendum)
Rounding Note    Patient Name: George [name] Date of Encounter: 06/06/2022  [facility] Cardiologist: Nona Dell, MD   Subjective   Overnight events noted, developed fever of 102.8. BC pending and placed on IV antibiotics  Family at the bedside this morning. Mentation is better but still seems off from baseline according to family. He is more cooperative.   Inpatient Medications    Scheduled Meds:  amLODipine  10 mg Oral Daily   aspirin EC  81 mg Oral Daily   carvedilol  6.25 mg Oral BID   insulin aspart  0-15 Units Subcutaneous Q4H   insulin glargine-yfgn  40 Units Subcutaneous QHS   sodium chloride flush  3 mL Intravenous Q12H   Continuous Infusions:  sodium chloride     sodium chloride 1 mL/kg/hr (06/06/22 0420)   heparin 1,850 Units/hr (06/06/22 1004)   piperacillin-tazobactam (ZOSYN)  IV 3.375 g (06/06/22 0515)   vancomycin     PRN Meds: sodium chloride, acetaminophen, nitroGLYCERIN, ondansetron (ZOFRAN) IV, sodium chloride flush   Vital Signs    Vitals:   06/05/22 2244 06/06/22 0119 06/06/22 0524 06/06/22 0745  BP: (!) 157/75 129/75 (!) 149/70 (!) 144/78  Pulse: (!) 126 (!) 109 (!) 106 (!) 110  Resp: 18 18 18 18   Temp: 99.5 F (37.5 C) 97.7 F (36.5 C) 97.7 F (36.5 C) 98.6 F (37 C)  TempSrc: Oral Oral Oral Oral  SpO2:  92% 91% 90%  Weight:   107.4 kg   Height:        Intake/Output Summary (Last 24 hours) at 06/06/2022 1035 Last data filed at 06/06/2022 0647 Gross per 24 hour  Intake 907.91 ml  Output 1400 ml  Net -492.09 ml      06/06/2022    5:24 AM 06/05/2022    6:27 PM 05/08/2022   12:19 PM  Last 3 Weights  Weight (lbs) 236 lb 12.4 oz 228 lb 6.3 oz 239 lb  Weight (kg) 107.4 kg 103.6 kg 108.41 kg      Telemetry    Sinus Tachycardia - Personally Reviewed  ECG    Reads as atrial fibrillation with RVR, appears more consistent with sinus tachycardia with PACs - Personally Reviewed  Physical Exam   GEN: No acute  distress.   Neck: No JVD Cardiac: Tachycardia, no murmurs, rubs, or gallops.  Respiratory: Clear to auscultation bilaterally. GI: Soft, nontender, non-distended  MS: No edema; No deformity. Neuro:  Nonfocal  Psych: alert to self, confused regarding events and timeline  Labs    High Sensitivity Troponin:  No results for input(s): "TROPONINIHS" in the last 720 hours.   Chemistry Recent Labs  Lab 06/05/22 2226 06/06/22 0643  NA 131* 134*  K 4.0 3.6  CL 94* 97*  CO2 22 26  GLUCOSE 398* 310*  BUN 17 21  CREATININE 1.05 1.02  CALCIUM 8.8* 8.8*  GFRNONAA >60 >60  ANIONGAP 15 11    Lipids  Recent Labs  Lab 06/06/22 0643  CHOL 144  TRIG 285*  HDL 37*  LDLCALC 50  CHOLHDL 3.9    Hematology Recent Labs  Lab 06/05/22 2226 06/06/22 0643  WBC 13.0* 12.2*  RBC 4.78 4.67  HGB 12.9* 12.6*  HCT 38.7* 37.2*  MCV 81.0 79.7*  MCH 27.0 27.0  MCHC 33.3 33.9  RDW 13.8 13.8  PLT 166 161   Thyroid No results for input(s): "TSH", "FREET4" in the last 168 hours.  BNPNo results for input(s): "BNP", "PROBNP" in the  last 168 hours.  DDimer No results for input(s): "DDIMER" in the last 168 hours.   Radiology    DG CHEST PORT 1 VIEW  Result Date: 06/05/2022 CLINICAL DATA:  Fever. EXAM: PORTABLE CHEST 1 VIEW COMPARISON:  Two-view chest x-ray 08/12/2018 FINDINGS: Heart is enlarged. No edema or effusion is present. No focal airspace disease is present. Mild degenerative changes are again noted in the shoulders. Visualized soft tissues and bony thorax are unremarkable. IMPRESSION: 1. Cardiomegaly without failure. 2. No acute cardiopulmonary disease. Electronically Signed   By: Marin Roberts M.D.   On: 06/05/2022 21:49    Cardiac Studies   N/a   Patient Profile     77 y.o. 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   Assessment &  Plan    Chest pain NSTEMI CAD status post CABG '14 -- Presented with left-sided neck and shoulder pain that has been constant since Sunday evening.  High-sensitivity troponin 120 while at [facility].  EKG without acute ST/T wave abnormalities.  Symptoms are somewhat atypical in nature, could be MSK related.  Given his history of CAD and elevated troponin initial plan was for cardiac cath but developed fever overnight. Cath deferred at this time. -- Continue IV heparin, aspirin, statin, beta-blocker  HFmrEF -- Most recent echocardiogram showed improvement in his LV function from 45% up to 50% -- Not appear volume overloaded on exam, chest x-ray at [facility] without edema prior to admission -- Continue Coreg, will hold Entresto for now   Hypertension -- increase coreg to 12.5mg  BID, continue amlodipine 10mg  daily   Hyperlipidemia -- on atorvastatin 80mg  daily    Diabetes -- Blood sugars were elevated in the 400 range while at [facility], treated with 10 units of insulin -- elevated but improving -- continue SSI, increase basal insulin from 40->50 units    Ascending aortic aneurysm -- CTA at [facility] measuring 4.1 cm  Leukocytosis AMS Fever -- developed fever overnight, temp max 102.8. Still complains of pain in the neck, around cervical spine area. Wife just now reports a tick bite about 1 1/2 weeks ago -- CXR neg, UA unremarkable  -- Blood cultures pending -- on vanc, zosyn  -- consult IM for further assistance with management    For questions or updates, please contact [facility] Please consult [email or web address] for contact info under        Signed, Laverda Page, NP  06/06/2022, 10:35 AM    Agree with note by Laverda Page NP-C  BC +. On ATBX. IM and ID on board. Sx are clearly not cardiac. MRI of head/neck pending.   Runell Gess, M.D., FACP, [facility], [facility] [address] [place], [place]   [address]  [phone] 06/06/2022 1:50 PM

## 2022-06-06 NOTE — Procedures (Signed)
Procedure: Left shoulder aspiration and injection   Indication: Left shoulder pain   Surgeon: Charma Igo, PA-C   Assist: None   Anesthesia: Topical refrigerant   EBL: None   Complications: Dry tap   Findings: After risks/benefits explained patient desires to undergo procedure. Consent obtained and time out performed. The left shoulder was sterilely prepped and aspirated. No fluid encountered. 5ml 0.5% Marcaine instilled. Pt tolerated the procedure well.       Freeman Caldron, PA-C Orthopedic Surgery [phone]

## 2022-06-06 NOTE — Assessment & Plan Note (Signed)
-  Patient developed fever overnight >102 -He is confused -No nuchal rigidity so appears unlikely to be meningitis although LP could be considered -His only complaint is L shoulder pain; imaging unremarkable, no obvious effusion but will consult ortho for joint aspiration -BCID is growing Staph aureus -He did have a recent tick bite so will add doxy coverage -He was started on Vanc/Zosyn but based on BCID it appears to be appropriate to narrow to Rocephin; awaiting pharmacy input -Will also request ID consult based on bacteremia -Will need Echo and probably TEE

## 2022-06-07 ENCOUNTER — Inpatient Hospital Stay (HOSPITAL_COMMUNITY): Payer: Medicare Other

## 2022-06-07 DIAGNOSIS — B9561 Methicillin susceptible Staphylococcus aureus infection as the cause of diseases classified elsewhere: Secondary | ICD-10-CM | POA: Diagnosis not present

## 2022-06-07 DIAGNOSIS — Z794 Long term (current) use of insulin: Secondary | ICD-10-CM | POA: Diagnosis not present

## 2022-06-07 DIAGNOSIS — E1165 Type 2 diabetes mellitus with hyperglycemia: Secondary | ICD-10-CM | POA: Diagnosis not present

## 2022-06-07 DIAGNOSIS — A4101 Sepsis due to Methicillin susceptible Staphylococcus aureus: Secondary | ICD-10-CM | POA: Diagnosis not present

## 2022-06-07 DIAGNOSIS — I214 Non-ST elevation (NSTEMI) myocardial infarction: Secondary | ICD-10-CM | POA: Diagnosis not present

## 2022-06-07 LAB — BASIC METABOLIC PANEL
Anion gap: 12 (ref 5–15)
BUN: 24 mg/dL — ABNORMAL HIGH (ref 8–23)
CO2: 23 mmol/L (ref 22–32)
Calcium: 8.6 mg/dL — ABNORMAL LOW (ref 8.9–10.3)
Chloride: 95 mmol/L — ABNORMAL LOW (ref 98–111)
Creatinine, Ser: 0.95 mg/dL (ref 0.61–1.24)
GFR, Estimated: >60 mL/min (ref >60)
Glucose, Bld: 331 mg/dL — ABNORMAL HIGH (ref 70–99)
Potassium: 3.6 mmol/L (ref 3.5–5.1)
Sodium: 130 mmol/L — ABNORMAL LOW (ref 135–145)

## 2022-06-07 LAB — HEPARIN LEVEL (UNFRACTIONATED): Heparin Unfractionated: 0.44 IU/mL (ref 0.30–0.70)

## 2022-06-07 LAB — CBC
HCT: 36.1 % — ABNORMAL LOW (ref 39.0–52.0)
Hemoglobin: 11.9 g/dL — ABNORMAL LOW (ref 13.0–17.0)
MCH: 26.9 pg (ref 26.0–34.0)
MCHC: 33 g/dL (ref 30.0–36.0)
MCV: 81.5 fL (ref 80.0–100.0)
Platelets: 161 10*3/uL (ref 150–400)
RBC: 4.43 MIL/uL (ref 4.22–5.81)
RDW: 13.9 % (ref 11.5–15.5)
WBC: 9 10*3/uL (ref 4.0–10.5)
nRBC: 0 % (ref 0.0–0.2)

## 2022-06-07 LAB — GLUCOSE, CAPILLARY
Glucose-Capillary: 241 mg/dL — ABNORMAL HIGH (ref 70–99)
Glucose-Capillary: 301 mg/dL — ABNORMAL HIGH (ref 70–99)
Glucose-Capillary: 292 mg/dL — ABNORMAL HIGH (ref 70–99)
Glucose-Capillary: 209 mg/dL — ABNORMAL HIGH (ref 70–99)
Glucose-Capillary: 215 mg/dL — ABNORMAL HIGH (ref 70–99)

## 2022-06-07 LAB — C-REACTIVE PROTEIN: CRP: 25.2 mg/dL — ABNORMAL HIGH (ref <1.0)

## 2022-06-07 LAB — HEMOGLOBIN A1C
Hgb A1c MFr Bld: 8.6 % — ABNORMAL HIGH (ref 4.8–5.6)
Mean Plasma Glucose: 200 mg/dL

## 2022-06-07 LAB — LYME DISEASE SEROLOGY W/REFLEX: Lyme Total Antibody EIA: NEGATIVE

## 2022-06-07 LAB — CULTURE, BLOOD (ROUTINE X 2)

## 2022-06-07 LAB — SEDIMENTATION RATE: Sed Rate: 84 mm/hr — ABNORMAL HIGH (ref 0–16)

## 2022-06-07 MED ORDER — DICLOFENAC SODIUM 1 % EX GEL
2.0000 g | Freq: Four times a day (QID) | CUTANEOUS | Status: DC | PRN
  Administered 2022-06-07 – 2022-06-13 (×4): 2 g via TOPICAL
  Filled 2022-06-07: qty 100

## 2022-06-07 MED ORDER — OXYCODONE HCL 5 MG PO TABS
5.0000 mg | ORAL_TABLET | Freq: Once | ORAL | Status: AC | PRN
  Administered 2022-06-07: 5 mg via ORAL
  Filled 2022-06-07: qty 1

## 2022-06-07 MED ORDER — GADOBUTROL 1 MMOL/ML IV SOLN
10.0000 mL | Freq: Once | INTRAVENOUS | Status: AC | PRN
  Administered 2022-06-07: 10 mL via INTRAVENOUS

## 2022-06-07 MED ORDER — MELATONIN 3 MG PO TABS
3.0000 mg | ORAL_TABLET | Freq: Every day | ORAL | Status: DC
  Administered 2022-06-07 – 2022-06-12 (×7): 3 mg via ORAL
  Filled 2022-06-07 (×7): qty 1

## 2022-06-07 MED ORDER — INSULIN GLARGINE-YFGN 100 UNIT/ML ~~LOC~~ SOLN
40.0000 [IU] | Freq: Two times a day (BID) | SUBCUTANEOUS | Status: DC
  Administered 2022-06-07: 40 [IU] via SUBCUTANEOUS
  Filled 2022-06-07 (×4): qty 0.4

## 2022-06-07 MED ORDER — ENOXAPARIN SODIUM 40 MG/0.4ML IJ SOSY
40.0000 mg | PREFILLED_SYRINGE | INTRAMUSCULAR | Status: DC
  Administered 2022-06-08 – 2022-06-13 (×6): 40 mg via SUBCUTANEOUS
  Filled 2022-06-07 (×6): qty 0.4

## 2022-06-07 NOTE — TOC CM/SW Note (Signed)
Transition of Care [facility]) - Inpatient Brief Assessment   Patient Details  Name: George [name] MRN: [id number] Date of Birth: 1945/11/28  Transition of Care [facility]) CM/SW Contact:    Leone Haven, RN Phone Number: 06/07/2022, 5:37 PM   Clinical Narrative: From home with wife, was indep pta, has no DME at home, has PCP, has insurance on file, still drives, will benefit from pt eval.  [name] his son will transport him home at [employer].  Presents with chest pian, on hep drip, for heart MRI today. Please consult TOC for any additional needs.   Transition of Care Asessment: Insurance and Status: Insurance coverage has been reviewed Patient has primary care physician: Yes Home environment has been reviewed: from home with wife Prior level of function:: indep Prior/Current Home Services: No current home services Social Determinants of Health Reivew: SDOH reviewed no interventions necessary Readmission risk has been reviewed: Yes Transition of care needs: no transition of care needs at this time

## 2022-06-07 NOTE — Consult Note (Signed)
   [facility] Inpatient Consult   06/07/2022  George [name] [birth year] [id number]  [facility] [[facility]]  Accountable Care Organization [ACO] Patient: [name] Medicare  Primary Care Provider:  Kirstie Peri, MD is with [facility] Internal Medicine   Patient screened for hospitalization with noted medium risk score for unplanned readmission risk to assess for potential [facility]  [[facility]] Care Management service needs for post hospital transition for care coordination.  Review of patient's electronic medical record reveals patient is from home but came to [facility] from [facility].  Also, reviewed for potential follow up needs.   Met with patient and granddaughter, George [name].  Patient was able to answer questions but did take some time to answer with his eyes closed and briefly making eye contact. Patient endorses PCP.  Explained to granddaughter potential needs for post hospital follow up.  Patient states he is ready to go home.  Granddaughter emphasizes patient to have MRI due to neck pain moving to his left chest area.  Notes ongoing testing is being recommended. Granddaughter given a 24 hour nurse advise line magnet and an appointment reminder card for post hospital follow up.   Plan:  Continue to follow progress and disposition to assess for post hospital community care coordination/management needs.  Referral request for community care coordination: anticipate post hospital referral request pending  Of note, [facility] Care Management/Population Health does not replace or interfere with any arrangements made by the Inpatient Transition of Care team.  For questions contact:   Charlesetta Shanks, RN BSN CCM [facility]  [phone] business mobile phone Toll free office [phone]  *Concierge Line  [phone] Fax number: [phone] [name]

## 2022-06-07 NOTE — Progress Notes (Signed)
[facility]  Date of Admission:  06/05/2022           Reason for visit: Follow up on MSSA bacteremia  Current antibiotics: Cefazolin  ASSESSMENT:    77 y.o. male admitted with:  MSSA Bacteremia: Patient presenting with concern for NSTEMI found to have high-grade MSSA bacteremia of unclear source with concern for seeding of left shoulder/[place] joint and cervical/thoracic spine based on symptoms thus far. Blood cultures are positive for MSSA from 06/05/22.  TTE was completed for dyspnea outpatient on 05/28/22 that did not see any vegetations. Repeat blood cultures drawn today. Left shoulder/[place] joint area pain: In setting of bacteremia, raises concern for possibility of septic arthritis/OM.  Ortho attempted shoulder aspiration but was a dry tap.  Today, he has more pain over his [place] joint area. Neck pain: Additionally, raises concern for seeding for the cervical and/or thoracic spine. NSTEMI: Troponins elevated in setting of sepsis and history of CAD s/p CABG 2014. Diabetes: A1c 8.6.  Sepsis: Due to #1.  Tick bite: Red herring.  Presentation is due to MSSA.  RECOMMENDATIONS:    Continue Cefazolin 2 gm q8h Follow up repeat blood cultures Pending MRI of the shoulder, cervical, and thoracic spine I think he'll need imaging of  joint as well.  If not able to be done today then hopefully can do tomorrow Glucose control, lab monitoring Following.  Dr Daiva Eves here this weekend.   Principal Problem:   MSSA bacteremia Active Problems:   HTN (hypertension)   Uncontrolled type 2 diabetes mellitus with hyperglycemia (HCC)   Mixed hyperlipidemia   NSTEMI (non-ST elevated myocardial infarction) (HCC)   Sepsis due to methicillin susceptible Staphylococcus aureus (MSSA) without acute organ dysfunction (HCC)   Acute pain of left shoulder    MEDICATIONS:    Scheduled Meds:  amLODipine  10 mg Oral Daily   aspirin EC  81 mg Oral Daily   atorvastatin  80 mg Oral  Daily   carvedilol  12.5 mg Oral BID   [START ON 06/08/2022] enoxaparin (LOVENOX) injection  40 mg Subcutaneous Q24H   insulin aspart  0-20 Units Subcutaneous TID WC   insulin aspart  0-5 Units Subcutaneous QHS   insulin aspart  3 Units Subcutaneous TID WC   insulin glargine-yfgn  40 Units Subcutaneous BID   melatonin  3 mg Oral QHS   sodium chloride flush  3 mL Intravenous Q12H   Continuous Infusions:  sodium chloride     sodium chloride 1 mL/kg/hr (06/06/22 0420)    ceFAZolin (ANCEF) IV 2 g (06/07/22 1459)   PRN Meds:.sodium chloride, acetaminophen, diclofenac Sodium, nitroGLYCERIN, ondansetron (ZOFRAN) IV, sodium chloride flush  SUBJECTIVE:   24 hour events:  No acute events noted overnight MRI still pending Confusion improved Complains of neck and upper chest/shoulder pain Febrile Tmax 102.9  Review of Systems  All other systems reviewed and are negative.     OBJECTIVE:   Blood pressure (!) 150/70, pulse 94, temperature 99 F (37.2 C), temperature source Axillary, resp. rate 18, height 6\' 4"  (1.93 m), weight 104.3 kg, SpO2 93 %. Body mass index is 27.99 kg/m.  Physical Exam Constitutional:      General: He is not in acute distress. HENT:     Head: Normocephalic and atraumatic.  Eyes:     Extraocular Movements: Extraocular movements intact.     Conjunctiva/sclera: Conjunctivae normal.  Cardiovascular:     Pulses: Normal pulses.  Pulmonary:  Effort: Pulmonary effort is normal. No respiratory distress.  Abdominal:     General: There is no distension.     Palpations: Abdomen is soft.  Musculoskeletal:     Cervical back: Tenderness present.     Comments: He has TTP over left [place] join/clavicle and left shoulder.   Decreased ROM of neck due to pain.   Skin:    General: Skin is warm and dry.  Neurological:     General: No focal deficit present.     Mental Status: He is alert and oriented to person, place, and time.  Psychiatric:        Mood and Affect: Mood  normal.        Behavior: Behavior normal.      Lab Results: Lab Results  Component Value Date   WBC 9.0 06/07/2022   HGB 11.9 (L) 06/07/2022   HCT 36.1 (L) 06/07/2022   MCV 81.5 06/07/2022   PLT 161 06/07/2022    Lab Results  Component Value Date   NA 130 (L) 06/07/2022   K 3.6 06/07/2022   CO2 23 06/07/2022   GLUCOSE 331 (H) 06/07/2022   BUN 24 (H) 06/07/2022   CREATININE 0.95 06/07/2022   CALCIUM 8.6 (L) 06/07/2022   GFRNONAA >60 06/07/2022   GFRAA >60 08/11/2018    Lab Results  Component Value Date   ALT 35 08/11/2018   AST 29 08/11/2018   ALKPHOS 122 08/11/2018   BILITOT 0.6 08/11/2018       Component Value Date/Time   CRP 25.2 (H) 06/07/2022 0718       Component Value Date/Time   ESRSEDRATE 84 (H) 06/07/2022 0718     I have reviewed the micro and lab results in Epic.  Imaging: MR SHOULDER LEFT WO CONTRAST  Result Date: 06/07/2022 CLINICAL DATA:  Chronic shoulder pain. Rotator cuff disorder suspected. EXAM: MRI OF THE LEFT SHOULDER WITHOUT CONTRAST TECHNIQUE: Multiplanar, multisequence MR imaging of the shoulder was performed. No intravenous contrast was administered. COMPARISON:  None Available. FINDINGS: Rotator cuff: There is moderate intermediate T2 signal and thickening tendinosis of the anterior greater than mid AP dimension of the supraspinatus tendon footprint. There is fluid bright signal indicating a partial-thickness tear of the mid to anterior supraspinatus tendon in a region measuring up to approximately 14 mm in AP dimension (sagittal series 6 images 4 and 5). Posteriorly, this tear is a midsubstance tear and anteriorly it extends through the bursal tendon surface where it appears to measure up to 6 mm in AP dimension. No significant tendon retraction. Punctate 2-3 mm midsubstance tear within the anterior infraspinatus tendon insertion. Mild partial-thickness articular sided tear of the superior subscapularis tendon insertion. The teres minor is  intact. Muscles: No rotator cuff muscle atrophy, fatty infiltration, or edema. Biceps long head: There is mild-to-moderate intermediate T2 signal and thickening tendinosis of the proximal long head of the biceps tendon. Acromioclavicular Joint: There are mild degenerative changes of the acromioclavicular joint including joint space narrowing, subchondral marrow edema, and peripheral osteophytosis. Type III acromion, with minimal downsloping of the anterolateral acromion. Mild fluid within the subacromial/subdeltoid bursa. Glenohumeral Joint: Moderate thinning of the glenohumeral cartilage. No joint effusion. Labrum: Grossly intact, but evaluation is limited by lack of intraarticular fluid. Bones:  No acute fracture. Other: There is mild-to-moderate fluid tracking along the fascial planes of the superficial posterior aspect of the infraspinatus and teres minor, deep to the posterior deltoid muscle. There is moderate edema throughout the adjacent posterior aspect of the  deltoid musculature (coronal series 3 images 1 through 6) overlying this fluid, and there is scattered decreased T1 and decreased T2 signal within the deltoid muscle edema, possibly from evolving subacute to chronic blood products. IMPRESSION: 1. Moderate anterior greater than mid supraspinatus tendinosis. Partial-thickness tear of the mid to anterior supraspinatus tendon measuring up to 14 mm in AP dimension. 2. Punctate 2-3 mm midsubstance tear within the anterior infraspinatus tendon insertion. 3. Mild partial-thickness articular sided tear of the superior subscapularis tendon insertion. 4. Mild-to-moderate proximal long head of the biceps tendinosis. 5. Mild degenerative changes of the acromioclavicular joint. 6. Overall there appears to be an interstitial tear of the posterior deltoid muscle with moderate edema and possible mild blood products interposed within the muscle fibers. There is mild to moderate fluid tracking between the posterior  deltoid muscle and the infraspinatus and teres minor muscles. Electronically Signed   By: Neita Garnet M.D.   On: 06/07/2022 14:33   DG Shoulder Left Port  Result Date: 06/06/2022 CLINICAL DATA:  Fever.  Pain. EXAM: LEFT SHOULDER COMPARISON:  None Available. FINDINGS: Single frontal view of the left shoulder. Mild acromioclavicular joint space narrowing and peripheral osteophytosis. Mild inferior glenohumeral joint space narrowing and peripheral osteophytosis. No acute fracture is seen. No dislocation. Status post median sternotomy. IMPRESSION: Mild acromioclavicular and glenohumeral osteoarthritis. Electronically Signed   By: Neita Garnet M.D.   On: 06/06/2022 13:20   DG CHEST PORT 1 VIEW  Result Date: 06/05/2022 CLINICAL DATA:  Fever. EXAM: PORTABLE CHEST 1 VIEW COMPARISON:  Two-view chest x-ray 08/12/2018 FINDINGS: Heart is enlarged. No edema or effusion is present. No focal airspace disease is present. Mild degenerative changes are again noted in the shoulders. Visualized soft tissues and bony thorax are unremarkable. IMPRESSION: 1. Cardiomegaly without failure. 2. No acute cardiopulmonary disease. Electronically Signed   By: Marin Roberts M.D.   On: 06/05/2022 21:49     Imaging independently reviewed in Epic.    Vedia Coffer for Infectious Disease [facility] [phone] pager 06/07/2022, 3:04 PM

## 2022-06-07 NOTE — Progress Notes (Addendum)
Rounding Note    Patient Name: George [name] Date of Encounter: 06/07/2022  [facility] Cardiologist: Nona Dell, MD   Subjective   Mentation has much improved this morning. Family at the bedside. No chest pain, continues to have left should pain/neck pain but improved.   Inpatient Medications    Scheduled Meds:  amLODipine  10 mg Oral Daily   aspirin EC  81 mg Oral Daily   atorvastatin  80 mg Oral Daily   carvedilol  12.5 mg Oral BID   [START ON 06/08/2022] enoxaparin (LOVENOX) injection  40 mg Subcutaneous Q24H   insulin aspart  0-20 Units Subcutaneous TID WC   insulin aspart  0-5 Units Subcutaneous QHS   insulin aspart  3 Units Subcutaneous TID WC   insulin glargine-yfgn  40 Units Subcutaneous BID   melatonin  3 mg Oral QHS   sodium chloride flush  3 mL Intravenous Q12H   Continuous Infusions:  sodium chloride     sodium chloride 1 mL/kg/hr (06/06/22 0420)    ceFAZolin (ANCEF) IV 2 g (06/07/22 0503)   PRN Meds: sodium chloride, acetaminophen, diclofenac Sodium, haloperidol lactate, nitroGLYCERIN, ondansetron (ZOFRAN) IV, sodium chloride flush   Vital Signs    Vitals:   06/06/22 1947 06/07/22 0009 06/07/22 0407 06/07/22 0756  BP: (!) 156/87 126/61 (!) 147/58 (!) 156/69  Pulse: (!) 121 85 85 90  Resp: 20 20 20 18   Temp: 98.1 F (36.7 C) 98.1 F (36.7 C) 99.6 F (37.6 C) 99 F (37.2 C)  TempSrc: Oral Oral Axillary Axillary  SpO2: 90% 99% 97% 93%  Weight:   104.3 kg   Height:        Intake/Output Summary (Last 24 hours) at 06/07/2022 1103 Last data filed at 06/07/2022 1610 Gross per 24 hour  Intake 1266.1 ml  Output 1100 ml  Net 166.1 ml      06/07/2022    4:07 AM 06/06/2022    5:24 AM 06/05/2022    6:27 PM  Last 3 Weights  Weight (lbs) 229 lb 15 oz 236 lb 12.4 oz 228 lb 6.3 oz  Weight (kg) 104.3 kg 107.4 kg 103.6 kg      Telemetry    Sinus Rhythm rates 80-90s - Personally Reviewed  ECG    Sinus Rhythm, 1st degree AVB, LBBB -  Personally Reviewed  Physical Exam   GEN: No acute distress.   Neck: No JVD Cardiac: RRR, no murmurs, rubs, or gallops.  Respiratory: Clear to auscultation bilaterally. GI: Soft, nontender, non-distended  MS: No edema; No deformity. Neuro:  Nonfocal  Psych: Normal affect   Labs    High Sensitivity Troponin:  No results for input(s): "TROPONINIHS" in the last 720 hours.   Chemistry Recent Labs  Lab 06/05/22 2226 06/06/22 0643 06/07/22 0659  NA 131* 134* 130*  K 4.0 3.6 3.6  CL 94* 97* 95*  CO2 22 26 23   GLUCOSE 398* 310* 331*  BUN 17 21 24*  CREATININE 1.05 1.02 0.95  CALCIUM 8.8* 8.8* 8.6*  GFRNONAA >60 >60 >60  ANIONGAP 15 11 12     Lipids  Recent Labs  Lab 06/06/22 0643  CHOL 144  TRIG 285*  HDL 37*  LDLCALC 50  CHOLHDL 3.9    Hematology Recent Labs  Lab 06/05/22 2226 06/06/22 0643 06/07/22 0718  WBC 13.0* 12.2* 9.0  RBC 4.78 4.67 4.43  HGB 12.9* 12.6* 11.9*  HCT 38.7* 37.2* 36.1*  MCV 81.0 79.7* 81.5  MCH 27.0 27.0 26.9  MCHC 33.3 33.9 33.0  RDW 13.8 13.8 13.9  PLT 166 161 161   Thyroid No results for input(s): "TSH", "FREET4" in the last 168 hours.  BNPNo results for input(s): "BNP", "PROBNP" in the last 168 hours.  DDimer No results for input(s): "DDIMER" in the last 168 hours.   Radiology    DG Shoulder Left Port  Result Date: 06/06/2022 CLINICAL DATA:  Fever.  Pain. EXAM: LEFT SHOULDER COMPARISON:  None Available. FINDINGS: Single frontal view of the left shoulder. Mild acromioclavicular joint space narrowing and peripheral osteophytosis. Mild inferior glenohumeral joint space narrowing and peripheral osteophytosis. No acute fracture is seen. No dislocation. Status post median sternotomy. IMPRESSION: Mild acromioclavicular and glenohumeral osteoarthritis. Electronically Signed   By: Neita Garnet M.D.   On: 06/06/2022 13:20   DG CHEST PORT 1 VIEW  Result Date: 06/05/2022 CLINICAL DATA:  Fever. EXAM: PORTABLE CHEST 1 VIEW COMPARISON:   Two-view chest x-ray 08/12/2018 FINDINGS: Heart is enlarged. No edema or effusion is present. No focal airspace disease is present. Mild degenerative changes are again noted in the shoulders. Visualized soft tissues and bony thorax are unremarkable. IMPRESSION: 1. Cardiomegaly without failure. 2. No acute cardiopulmonary disease. Electronically Signed   By: Marin Roberts M.D.   On: 06/05/2022 21:49    Cardiac Studies   N/a  Patient Profile     77 y.o. 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 Developed fever, found to have MSSA bacteremia.  Assessment & Plan    Chest pain NSTEMI CAD status post CABG '14 -- Presented with left-sided neck and shoulder pain that has been constant since Sunday evening.  High-sensitivity troponin 120 while at [facility].  EKG without acute ST/T wave abnormalities.  Symptoms are somewhat atypical in nature, could be MSK related.  Given his history of CAD and elevated troponin initial plan was for cardiac cath but developed fever overnight. Cath deferred at this time.  -- Continue aspirin, statin, beta-blocker. Given low concern for ACS, will DC heparin gtt   HFmrEF -- Most recent echocardiogram showed improvement in his LV function from 45% up to 50% -- Not appear volume overloaded on exam, chest x-ray at [facility] without edema prior to admission. Repeat CXR here WNL -- Continue Coreg, amlodipine   Hypertension -- continue coreg to 12.5mg  BID, continue amlodipine 10mg  daily   Hyperlipidemia -- on atorvastatin 80mg  daily    Diabetes -- Blood sugars were elevated in the 400 range while at [facility], treated with 10 units of insulin -- elevated but improving -- continue SSI, basal insulin 40u BID    Ascending aortic aneurysm -- CTA at [facility] measuring 4.1 cm   Leukocytosis Encephalopathy   Fever MSSA bacteremia Left shoulder/neck pain -- developed fever 5/22, temp max 102.8. Still complained of pain in the neck, around cervical spine area. Wife reports a tick bite about 1 1/2 weeks ago -- CXR neg, UA unremarkable  -- Blood cultures + GPC -- on vanc, zosyn and doxy initially, stopped per ID consult. Placed on cefalozin  -- MRI spine and left should pending -- IM following, appreciate them assuming care of the patient  For questions or updates, please contact [facility] Please consult [email or web address] for contact info under        Signed, Laverda Page, NP  06/07/2022, 11:03 AM     Agree with  note by Laverda Page NP-C  Patient admitted with neck back and chest pain.  This does not appear to be ACS.  The pain is positional.  His blood cultures are positive.  His EF is mildly reduced.  He is in a lot of pain.  Internal medicine was assuming care.  MRI of the upper chest is pending.  We will sign off will be available for further questions if necessary.  Runell Gess, M.D., FACP, [facility], [facility] [address] [place], [place]  [address]  [phone] 06/07/2022 11:34 AM

## 2022-06-07 NOTE — Inpatient Diabetes Management (Signed)
Inpatient Diabetes Program Recommendations  AACE/ADA: New Consensus Statement on Inpatient Glycemic Control (2015)  Target Ranges:  Prepandial:   less than 140 mg/dL      Peak postprandial:   less than 180 mg/dL (1-2 hours)      Critically ill patients:  140 - 180 mg/dL   Lab Results  Component Value Date   GLUCAP 292 (H) 06/07/2022   HGBA1C 8.6 (H) 06/06/2022    Review of Glycemic Control  Diab  Latest Reference Range & Units 06/06/22 09:42 06/06/22 11:45 06/06/22 15:17 06/06/22 21:41 06/07/22 06:18 06/07/22 11:02  Glucose-Capillary 70 - 99 mg/dL 409 (H) 811 (H) 914 (H) 241 (H) 301 (H) 292 (H)  (H): Data is abnormally high  Diabetes history: DM2 Outpatient Diabetes medications: Lantus 70 units QD, Novolog 50 units TID with meals  Current orders for Inpatient glycemic control: Semglee 40 units BID, Novolog 0-20 units TID and 0-5 units QHS, Novolog 3 units TID with meals  Inpatient Diabetes Program Recommendations:    Please consider increasing meals coverage:  Novolog 5 units TID with meals if he consumes at least 50% Agree with increase of basal insulin today  Will continue to follow while inpatient.  Thank you, Dulce Sellar, MSN, CDCES Diabetes Coordinator Inpatient Diabetes Program [phone] (team pager from 8a-5p)

## 2022-06-07 NOTE — Progress Notes (Signed)
PROGRESS NOTE    SAIGE ANGELL  WUJ:[id number] DOB: December 22, 1945 DOA: 06/05/2022 PCP: Kirstie Peri, MD   Brief Narrative:  George [name] is a 77 y.o. male with past medical history of AAA, chronic pain, CAD s/p CABG, HTN, HLD, DM, chronic systolic CHF, and prostate CA who presented with chest pain.  Recent stress test on 05/08/22 showed prior infarction and was intermediate risk with EF 30-44%.  He was admitted under cardiology with NSTEMI with plan for cardiac catheterization.  he spiked a fever to 102.  His only complaint is L shoulder pain that radiates into his L neck and jaw and into his L chest.  Hospitalist were consulted for that.  Eventually Ortho and ID were consulted.  Assessment & Plan:   Principal Problem:   NSTEMI (non-ST elevated myocardial infarction) (HCC) Active Problems:   HTN (hypertension)   Uncontrolled type 2 diabetes mellitus with hyperglycemia (HCC)   Mixed hyperlipidemia   Staphylococcus aureus bacteremia   Sepsis due to methicillin susceptible Staphylococcus aureus (MSSA) without acute organ dysfunction (HCC)   Acute pain of left shoulder  NSTEMI (non-ST elevated myocardial infarction) (HCC) -Patient with prior h/o CAD s/p CABG presenting with chest pain and uptrending troponin, consistent with NSTEMI.  He remains on heparin drip, plan for cardiac cath, management per primary service/cardiology.   Staphylococcus aureus bacteremia -Patient developed fever >102.  Complains of neck pain and left shoulder pain.  Ortho was consulted.  They did arthrocentesis of the left shoulder but it was a dry tap.  MRI spine and left shoulder is pending.  ID consulted and managing antibiotics.  He is on cefazolin.  For 2 further management by ID.   Mixed hyperlipidemia -Continue high-dose Lipitor   Uncontrolled type 2 diabetes mellitus with hyperglycemia (HCC) Appears to be taking 70 units of long-acting insulin at night however he was resumed 60 units last night, blood sugar  still significantly elevated, will increase to 80 mg of Semglee but split it into 2 doses with 40 mg twice daily and continue SSI.   HTN (hypertension) -Continue amlodipine and carvedilol -He was also on Toprol XL and Catapres at home but these are on hold currently.  Defer to cardiology.  DVT prophylaxis: Heparin drip   Code Status: Full Code  Family Communication: Daughter/granddaughter none present at bedside.  Plan of care discussed with patient in length and he/she verbalized understanding and agreed with it.  Status is: Inpatient Remains inpatient appropriate because: Decision per primary service/cardiology   Estimated body mass index is 27.99 kg/m as calculated from the following:   Height as of this encounter: 6\' 4"  (1.93 m).   Weight as of this encounter: 104.3 kg.    Nutritional Assessment: Body mass index is 27.99 kg/m.Marland Kitchen Seen by dietician.  I agree with the assessment and plan as outlined below: Nutrition Status:        . Skin Assessment: I have examined the patient's skin and I agree with the wound assessment as performed by the wound care RN as outlined below:    Consultants:  TRH, ID, orthopedics  Procedures:  As above  Antimicrobials:  Anti-infectives (From admission, onward)    Start     Dose/Rate Route Frequency Ordered Stop   06/06/22 1430  ceFAZolin (ANCEF) IVPB 2g/100 mL premix        2 g 200 mL/hr over 30 Minutes Intravenous Every 8 hours 06/06/22 1339     06/06/22 1400  doxycycline (VIBRA-TABS) tablet 100 mg  Status:  Discontinued        100 mg Oral Every 12 hours 06/06/22 1222 06/06/22 1449   06/06/22 1300  vancomycin (VANCOREADY) IVPB 1250 mg/250 mL  Status:  Discontinued        1,250 mg 166.7 mL/hr over 90 Minutes Intravenous Every 12 hours 06/05/22 2247 06/06/22 1339   06/06/22 0600  piperacillin-tazobactam (ZOSYN) IVPB 3.375 g  Status:  Discontinued        3.375 g 12.5 mL/hr over 240 Minutes Intravenous Every 8 hours 06/05/22 2247  06/06/22 1339   06/05/22 2345  vancomycin (VANCOREADY) IVPB 2000 mg/400 mL        2,000 mg 200 mL/hr over 120 Minutes Intravenous  Once 06/05/22 2247 06/06/22 0820   06/05/22 2345  piperacillin-tazobactam (ZOSYN) IVPB 3.375 g        3.375 g 100 mL/hr over 30 Minutes Intravenous  Once 06/05/22 2247 06/06/22 1004         Subjective: Seen and examined.  Still complains of neck pain and shoulder pain but improving.  Granddaughter at the bedside.  Patient denies any chest pain, shortness of breath or palpitation.  Objective: Vitals:   06/06/22 1947 06/07/22 0009 06/07/22 0407 06/07/22 0756  BP: (!) 156/87 126/61 (!) 147/58 (!) 156/69  Pulse: (!) 121 85 85 90  Resp: 20 20 20 18   Temp: 98.1 F (36.7 C) 98.1 F (36.7 C) 99.6 F (37.6 C) 99 F (37.2 C)  TempSrc: Oral Oral Axillary Axillary  SpO2: 90% 99% 97% 93%  Weight:   104.3 kg   Height:        Intake/Output Summary (Last 24 hours) at 06/07/2022 0944 Last data filed at 06/07/2022 1610 Gross per 24 hour  Intake 1266.1 ml  Output 1100 ml  Net 166.1 ml   Filed Weights   06/05/22 1827 06/06/22 0524 06/07/22 0407  Weight: 103.6 kg 107.4 kg 104.3 kg    Examination:  General exam: Appears calm and comfortable, obese Respiratory system: Clear to auscultation. Respiratory effort normal. Cardiovascular system: S1 & S2 heard, RRR. No JVD, murmurs, rubs, gallops or clicks. No pedal edema. Gastrointestinal system: Abdomen is nondistended, soft and nontender. No organomegaly or masses felt. Normal bowel sounds heard. Central nervous system: Alert and oriented. No focal neurological deficits.  Cervical spine tenderness. Extremities: Symmetric 5 x 5 power. Skin: No rashes, lesions or ulcers Psychiatry: Judgement and insight appear normal. Mood & affect appropriate.    Data Reviewed: I have personally reviewed following labs and imaging studies  CBC: Recent Labs  Lab 06/05/22 2226 06/06/22 0643 06/07/22 0718  WBC 13.0* 12.2*  9.0  NEUTROABS 11.3*  --   --   HGB 12.9* 12.6* 11.9*  HCT 38.7* 37.2* 36.1*  MCV 81.0 79.7* 81.5  PLT 166 161 161   Basic Metabolic Panel: Recent Labs  Lab 06/05/22 2226 06/06/22 0643 06/07/22 0659  NA 131* 134* 130*  K 4.0 3.6 3.6  CL 94* 97* 95*  CO2 22 26 23   GLUCOSE 398* 310* 331*  BUN 17 21 24*  CREATININE 1.05 1.02 0.95  CALCIUM 8.8* 8.8* 8.6*   GFR: Estimated Creatinine Clearance: 86.4 mL/min (by C-G formula based on SCr of 0.95 mg/dL). Liver Function Tests: No results for input(s): "AST", "ALT", "ALKPHOS", "BILITOT", "PROT", "ALBUMIN" in the last 168 hours. No results for input(s): "LIPASE", "AMYLASE" in the last 168 hours. No results for input(s): "AMMONIA" in the last 168 hours. Coagulation Profile: No results for input(s): "INR", "PROTIME" in the last 168 hours.  Cardiac Enzymes: No results for input(s): "CKTOTAL", "CKMB", "CKMBINDEX", "TROPONINI" in the last 168 hours. BNP (last 3 results) No results for input(s): "PROBNP" in the last 8760 hours. HbA1C: Recent Labs    06/06/22 0643  HGBA1C 8.6*   CBG: Recent Labs  Lab 06/06/22 0942 06/06/22 1145 06/06/22 1517 06/06/22 2141 06/07/22 0618  GLUCAP 279* 305* 239* 241* 301*   Lipid Profile: Recent Labs    06/06/22 0643  CHOL 144  HDL 37*  LDLCALC 50  TRIG 161*  CHOLHDL 3.9   Thyroid Function Tests: No results for input(s): "TSH", "T4TOTAL", "FREET4", "T3FREE", "THYROIDAB" in the last 72 hours. Anemia Panel: No results for input(s): "VITAMINB12", "FOLATE", "FERRITIN", "TIBC", "IRON", "RETICCTPCT" in the last 72 hours. Sepsis Labs: No results for input(s): "PROCALCITON", "LATICACIDVEN" in the last 168 hours.  Recent Results (from the past 240 hour(s))  Culture, blood (Routine X 2) w Reflex to ID Panel     Status: Abnormal (Preliminary result)   Collection Time: 06/05/22 10:26 PM   Specimen: BLOOD LEFT HAND  Result Value Ref Range Status   Specimen Description BLOOD LEFT HAND  Final    Special Requests NONE  Final   Culture  Setup Time   Final    GRAM POSITIVE COCCI IN CLUSTERS IN BOTH AEROBIC AND ANAEROBIC BOTTLES CRITICAL VALUE NOTED.  VALUE IS CONSISTENT WITH PREVIOUSLY REPORTED AND CALLED VALUE. Performed at [facility], [address], [place], [place] [address]    Culture STAPHYLOCOCCUS AUREUS (A)  Final   Report Status PENDING  Incomplete  Culture, blood (Routine X 2) w Reflex to ID Panel     Status: Abnormal (Preliminary result)   Collection Time: 06/05/22 10:26 PM   Specimen: BLOOD RIGHT HAND  Result Value Ref Range Status   Specimen Description BLOOD RIGHT HAND  Final   Special Requests   Final    BOTTLES DRAWN AEROBIC AND ANAEROBIC Blood Culture adequate volume   Culture  Setup Time   Final    GRAM POSITIVE COCCI IN CLUSTERS IN BOTH AEROBIC AND ANAEROBIC BOTTLES CRITICAL RESULT CALLED TO, READ BACK BY AND VERIFIED WITH: PHARMD A PAYTES [id number] AT 1325 BY CM    Culture (A)  Final    STAPHYLOCOCCUS AUREUS SUSCEPTIBILITIES TO FOLLOW Performed at [facility], [address] [place], [place] [address]    Report Status PENDING  Incomplete  Blood Culture ID Panel (Reflexed)     Status: Abnormal   Collection Time: 06/05/22 10:26 PM  Result Value Ref Range Status   Enterococcus faecalis NOT DETECTED NOT DETECTED Final   Enterococcus Faecium NOT DETECTED NOT DETECTED Final   Listeria monocytogenes NOT DETECTED NOT DETECTED Final   Staphylococcus species DETECTED (A) NOT DETECTED Final    Comment: CRITICAL RESULT CALLED TO, READ BACK BY AND VERIFIED WITH: PHARMD A PAYTES [id number] AT 1325 BY CM    Staphylococcus aureus (BCID) DETECTED (A) NOT DETECTED Final    Comment: CRITICAL RESULT CALLED TO, READ BACK BY AND VERIFIED WITH: PHARMD A PAYTES [id number] AT 1325 BY CM    Staphylococcus epidermidis NOT DETECTED NOT DETECTED Final   Staphylococcus lugdunensis NOT DETECTED NOT DETECTED Final   Streptococcus species NOT DETECTED NOT DETECTED Final    Streptococcus agalactiae NOT DETECTED NOT DETECTED Final   Streptococcus pneumoniae NOT DETECTED NOT DETECTED Final   Streptococcus pyogenes NOT DETECTED NOT DETECTED Final   A.calcoaceticus-baumannii NOT DETECTED NOT DETECTED Final   Bacteroides fragilis NOT DETECTED NOT DETECTED Final  Enterobacterales NOT DETECTED NOT DETECTED Final   Enterobacter cloacae complex NOT DETECTED NOT DETECTED Final   Escherichia coli NOT DETECTED NOT DETECTED Final   Klebsiella aerogenes NOT DETECTED NOT DETECTED Final   Klebsiella oxytoca NOT DETECTED NOT DETECTED Final   Klebsiella pneumoniae NOT DETECTED NOT DETECTED Final   Proteus species NOT DETECTED NOT DETECTED Final   Salmonella species NOT DETECTED NOT DETECTED Final   Serratia marcescens NOT DETECTED NOT DETECTED Final   Haemophilus influenzae NOT DETECTED NOT DETECTED Final   Neisseria meningitidis NOT DETECTED NOT DETECTED Final   Pseudomonas aeruginosa NOT DETECTED NOT DETECTED Final   Stenotrophomonas maltophilia NOT DETECTED NOT DETECTED Final   Candida albicans NOT DETECTED NOT DETECTED Final   Candida auris NOT DETECTED NOT DETECTED Final   Candida glabrata NOT DETECTED NOT DETECTED Final   Candida krusei NOT DETECTED NOT DETECTED Final   Candida parapsilosis NOT DETECTED NOT DETECTED Final   Candida tropicalis NOT DETECTED NOT DETECTED Final   Cryptococcus neoformans/gattii NOT DETECTED NOT DETECTED Final   Meth resistant mecA/C and MREJ NOT DETECTED NOT DETECTED Final    Comment: Performed at [facility], [address] [place], [place] [address]     Radiology Studies: DG Shoulder Left Port  Result Date: 06/06/2022 CLINICAL DATA:  Fever.  Pain. EXAM: LEFT SHOULDER COMPARISON:  None Available. FINDINGS: Single frontal view of the left shoulder. Mild acromioclavicular joint space narrowing and peripheral osteophytosis. Mild inferior glenohumeral joint space narrowing and peripheral osteophytosis. No acute fracture is seen. No  dislocation. Status post median sternotomy. IMPRESSION: Mild acromioclavicular and glenohumeral osteoarthritis. Electronically Signed   By: Neita Garnet M.D.   On: 06/06/2022 13:20   DG CHEST PORT 1 VIEW  Result Date: 06/05/2022 CLINICAL DATA:  Fever. EXAM: PORTABLE CHEST 1 VIEW COMPARISON:  Two-view chest x-ray 08/12/2018 FINDINGS: Heart is enlarged. No edema or effusion is present. No focal airspace disease is present. Mild degenerative changes are again noted in the shoulders. Visualized soft tissues and bony thorax are unremarkable. IMPRESSION: 1. Cardiomegaly without failure. 2. No acute cardiopulmonary disease. Electronically Signed   By: Marin Roberts M.D.   On: 06/05/2022 21:49    Scheduled Meds:  amLODipine  10 mg Oral Daily   aspirin EC  81 mg Oral Daily   atorvastatin  80 mg Oral Daily   carvedilol  12.5 mg Oral BID   insulin aspart  0-20 Units Subcutaneous TID WC   insulin aspart  0-5 Units Subcutaneous QHS   insulin aspart  3 Units Subcutaneous TID WC   insulin glargine-yfgn  40 Units Subcutaneous BID   melatonin  3 mg Oral QHS   sodium chloride flush  3 mL Intravenous Q12H   Continuous Infusions:  sodium chloride     sodium chloride 1 mL/kg/hr (06/06/22 0420)    ceFAZolin (ANCEF) IV 2 g (06/07/22 0503)   heparin 2,000 Units/hr (06/07/22 0507)     LOS: 2 days   Hughie Closs, MD [facility]  06/07/2022, 9:44 AM   *Please note that this is a verbal dictation therefore any spelling or grammatical errors are due to the "Dragon Medical One" system interpretation.  Please page via Amion and do not message via secure chat for urgent patient care matters. Secure chat can be used for non urgent patient care matters.  How to contact the [facility] Attending or Consulting provider 7A - 7P or covering provider during after hours 7P -7A, for this patient?  Check the care  team in [facility] and look for a) attending/consulting TR[facility] provider listed and b) the [facility] team listed. Page  or secure chat 7A-7P. Log into [email or web address] and use [place]'s universal password to access. If you do not have the password, please contact the hospital operator. Locate the [facility] provider you are looking for under [facility] and page to a number that you can be directly reached. If you still have difficulty reaching the provider, please page the [facility] (Director on Call) for the Hospitalists listed on amion for assistance.

## 2022-06-08 ENCOUNTER — Inpatient Hospital Stay (HOSPITAL_COMMUNITY): Payer: Medicare Other

## 2022-06-08 DIAGNOSIS — R7881 Bacteremia: Principal | ICD-10-CM

## 2022-06-08 DIAGNOSIS — B9561 Methicillin susceptible Staphylococcus aureus infection as the cause of diseases classified elsewhere: Secondary | ICD-10-CM | POA: Diagnosis not present

## 2022-06-08 LAB — CBC
HCT: 33.3 % — ABNORMAL LOW (ref 39.0–52.0)
Hemoglobin: 11.2 g/dL — ABNORMAL LOW (ref 13.0–17.0)
MCH: 27.5 pg (ref 26.0–34.0)
MCHC: 33.6 g/dL (ref 30.0–36.0)
MCV: 81.8 fL (ref 80.0–100.0)
Platelets: 158 10*3/uL (ref 150–400)
RBC: 4.07 MIL/uL — ABNORMAL LOW (ref 4.22–5.81)
RDW: 13.7 % (ref 11.5–15.5)
WBC: 6.6 10*3/uL (ref 4.0–10.5)
nRBC: 0 % (ref 0.0–0.2)

## 2022-06-08 LAB — CULTURE, BLOOD (ROUTINE X 2)
Culture: NO GROWTH
Special Requests: ADEQUATE

## 2022-06-08 LAB — GLUCOSE, CAPILLARY
Glucose-Capillary: 292 mg/dL — ABNORMAL HIGH (ref 70–99)
Glucose-Capillary: 326 mg/dL — ABNORMAL HIGH (ref 70–99)
Glucose-Capillary: 284 mg/dL — ABNORMAL HIGH (ref 70–99)
Glucose-Capillary: 224 mg/dL — ABNORMAL HIGH (ref 70–99)

## 2022-06-08 LAB — BASIC METABOLIC PANEL
Anion gap: 9 (ref 5–15)
BUN: 28 mg/dL — ABNORMAL HIGH (ref 8–23)
CO2: 26 mmol/L (ref 22–32)
Calcium: 8.3 mg/dL — ABNORMAL LOW (ref 8.9–10.3)
Chloride: 96 mmol/L — ABNORMAL LOW (ref 98–111)
Creatinine, Ser: 1.05 mg/dL (ref 0.61–1.24)
GFR, Estimated: >60 mL/min (ref >60)
Glucose, Bld: 263 mg/dL — ABNORMAL HIGH (ref 70–99)
Potassium: 3.3 mmol/L — ABNORMAL LOW (ref 3.5–5.1)
Sodium: 131 mmol/L — ABNORMAL LOW (ref 135–145)

## 2022-06-08 MED ORDER — POLYETHYLENE GLYCOL 3350 17 G PO PACK
17.0000 g | PACK | Freq: Every day | ORAL | Status: DC
  Administered 2022-06-08 – 2022-06-13 (×6): 17 g via ORAL
  Filled 2022-06-08 (×6): qty 1

## 2022-06-08 MED ORDER — POTASSIUM CHLORIDE CRYS ER 20 MEQ PO TBCR
40.0000 meq | EXTENDED_RELEASE_TABLET | ORAL | Status: DC
  Administered 2022-06-08: 40 meq via ORAL
  Filled 2022-06-08: qty 2

## 2022-06-08 MED ORDER — DOCUSATE SODIUM 100 MG PO CAPS
100.0000 mg | ORAL_CAPSULE | Freq: Two times a day (BID) | ORAL | Status: DC
  Administered 2022-06-08 – 2022-06-13 (×10): 100 mg via ORAL
  Filled 2022-06-08 (×10): qty 1

## 2022-06-08 MED ORDER — INSULIN GLARGINE-YFGN 100 UNIT/ML ~~LOC~~ SOLN
50.0000 [IU] | Freq: Two times a day (BID) | SUBCUTANEOUS | Status: DC
  Administered 2022-06-08 (×2): 50 [IU] via SUBCUTANEOUS
  Filled 2022-06-08 (×4): qty 0.5

## 2022-06-08 MED ORDER — POTASSIUM CHLORIDE 20 MEQ PO PACK
40.0000 meq | PACK | ORAL | Status: AC
  Administered 2022-06-08: 40 meq via ORAL
  Filled 2022-06-08: qty 2

## 2022-06-08 MED ORDER — OXYCODONE HCL 5 MG PO TABS
5.0000 mg | ORAL_TABLET | ORAL | Status: DC | PRN
  Administered 2022-06-08 – 2022-06-10 (×7): 5 mg via ORAL
  Filled 2022-06-08 (×7): qty 1

## 2022-06-08 NOTE — Evaluation (Signed)
Clinical/Bedside Swallow Evaluation Patient Details  Name: George [name] MRN: [id number] Date of Birth: 1945/12/10  Today's Date: 06/08/2022 Time: SLP Start Time (ACUTE ONLY): 1215 SLP Stop Time (ACUTE ONLY): 1238 SLP Time Calculation (min) (ACUTE ONLY): 23 min  Past Medical History:  Past Medical History:  Diagnosis Date   Ascending aortic aneurysm (HCC)    4.1 cm in 2019   Chronic back pain    Coronary artery disease    Status post CABG 2014 (LIMA to LAD, SVG to ramus, SVG to OM 2)   Dermatophytosis of the body    Eczema    Essential hypertension    GERD (gastroesophageal reflux disease)    Herpes zoster without mention of complication    History of bronchitis    History of claustrophobia    HOH (hard of hearing)    Mixed hyperlipidemia    Prostate cancer (HCC)    Psoriasis and similar disorder    Sinusitis    Type 2 diabetes mellitus (HCC)    Past Surgical History:  Past Surgical History:  Procedure Laterality Date   CATARACT EXTRACTION W/PHACO Left 04/04/2014   Procedure: CATARACT EXTRACTION PHACO AND INTRAOCULAR LENS PLACEMENT (IOC);  Surgeon: Susa Simmonds, MD;  Location: [facility];  Service: Ophthalmology;  Laterality: Left;  CDE:4.49   CATARACT EXTRACTION W/PHACO Right 01/02/2015   Procedure: CATARACT EXTRACTION PHACO AND INTRAOCULAR LENS PLACEMENT; CDE:  6.36;  Surgeon: Susa Simmonds, MD;  Location: [facility];  Service: Ophthalmology;  Laterality: Right;   CIRCUMCISION  2010   CORONARY ARTERY BYPASS GRAFT N/A 08/13/2012   Procedure: CORONARY ARTERY BYPASS GRAFTING (CABG);  Surgeon: Kerin Perna, MD;  Location: [facility] OR;  Service: Open Heart Surgery;  Laterality: N/A;   ENDOVEIN HARVEST OF GREATER SAPHENOUS VEIN Right 08/13/2012   Procedure: ENDOVEIN HARVEST OF GREATER SAPHENOUS VEIN;  Surgeon: Kerin Perna, MD;  Location: [facility] OR;  Service: Open Heart Surgery;  Laterality: Right;   INTRAOPERATIVE TRANSESOPHAGEAL ECHOCARDIOGRAM N/A 08/13/2012   Procedure: INTRAOPERATIVE  TRANSESOPHAGEAL ECHOCARDIOGRAM;  Surgeon: Kerin Perna, MD;  Location: [facility] OR;  Service: Open Heart Surgery;  Laterality: N/A;   LEFT HEART CATHETERIZATION WITH CORONARY ANGIOGRAM N/A 08/07/2012   Procedure: LEFT HEART CATHETERIZATION WITH CORONARY ANGIOGRAM;  Surgeon: Peter M Swaziland, MD;  Location: [facility] CATH LAB;  Service: Cardiovascular;  Laterality: N/A;   RADIOACTIVE SEED IMPLANT N/A 11/25/2017   Procedure: RADIOACTIVE SEED IMPLANT/BRACHYTHERAPY IMPLANT;  Surgeon: Bjorn Pippin, MD;  Location: [facility];  Service: Urology;  Laterality: N/A;   SEED IMPLANT DONE AT [facility]  01/2017   HPI:  George [name] is a 77 y.o. male with past medical history of AAA, chronic pain, CAD s/p CABG, HTN, HLD, DM, chronic systolic CHF, and prostate CA who presented with chest pain.  Recent stress test on 05/08/22 showed prior infarction and was intermediate risk with EF 30-44%.  He was admitted under cardiology with NSTEMI with plan for cardiac catheterization.  he spiked a fever to 102.  His only complaint is L shoulder pain that radiates into his L neck and jaw and into his L chest.  Hospitalist were consulted for that.  Eventually Ortho and ID were consulted. There is straightening of the normal cervical lordosis, anterior cervical osteophytes C4;ST consulted d/t c/o swallowing difficulty by pt/family.    Assessment / Plan / Recommendation  Clinical Impression  Pt seen for clinical swallowing evaluation with normal oropharyngeal swallow noted, but potential for esophageal dysphagia as pt  exhibited delayed cough with larger boluses of thin liquids.  This resolved with smaller sips taken with min cues by SLP.  Pt denotes "some of my pills come back up" when larger pills consumed with liquids.  Nursing has not observed this occurrence.  Recommendation for crushed and/or divide pills in puree to ease transition into pharynx. Pt with acute L lateral neck edema that precipitated this consultation.  No overt  s/sx of aspiration observed during assessment, but potential for esophageal dysphagia per observation/pt report.  OME unremarkable.  Recommend utilizing esophageal precautions/general swallowing precautions for bite/sip size, slow rate and upright positioning during meals/snacks.  Pt/wife in agreement.  At the end of the assessment, pt stated "My swallowing is fine", but nursing stated he has been intermittently confused this hospitalization. Continue regular/thin liquids with precautions for esophageal/aspiration instituted during meals.  ST will s/o in this setting unless further needs arise.  Thank you for this consult. SLP Visit Diagnosis: Dysphagia, pharyngoesophageal phase (R13.14)    Aspiration Risk  Mild aspiration risk    Diet Recommendation   Regular/thin liquids with use of esophageal/aspiration precautions in place  Medication Administration: Whole meds with puree (or crushed prn)    Other  Recommendations Oral Care Recommendations: Oral care BID    Recommendations for follow up therapy are one component of a multi-disciplinary discharge planning process, led by the attending physician.  Recommendations may be updated based on patient status, additional functional criteria and insurance authorization.  Follow up Recommendations Follow physician's recommendations for discharge plan and follow up therapies      Assistance Recommended at Discharge    Functional Status Assessment Patient has had a recent decline in their functional status and demonstrates the ability to make significant improvements in function in a reasonable and predictable amount of time.  Frequency and Duration  (evaluation only)          Prognosis Prognosis for improved oropharyngeal function: Good      Swallow Study   General Date of Onset: 06/05/22 HPI: George [name] is a 77 y.o. male with past medical history of AAA, chronic pain, CAD s/p CABG, HTN, HLD, DM, chronic systolic CHF, and prostate CA who  presented with chest pain.  Recent stress test on 05/08/22 showed prior infarction and was intermediate risk with EF 30-44%.  He was admitted under cardiology with NSTEMI with plan for cardiac catheterization.  he spiked a fever to 102.  His only complaint is L shoulder pain that radiates into his L neck and jaw and into his L chest.  Hospitalist were consulted for that.  Eventually Ortho and ID were consulted. There is straightening of the normal cervical lordosis, anterior cervical osteophytes C4;ST consulted d/t c/o swallowing difficulty by pt/family. Type of Study: Bedside Swallow Evaluation Previous Swallow Assessment: n/a Diet Prior to this Study: Regular;Thin liquids (Level 0) Temperature Spikes Noted: Yes Respiratory Status: Room air History of Recent Intubation: No Behavior/Cognition: Alert;Cooperative;Pleasant mood Oral Cavity Assessment: Within Functional Limits Oral Care Completed by SLP: No Oral Cavity - Dentition: Missing dentition Vision: Functional for self-feeding Self-Feeding Abilities: Able to feed self Patient Positioning: Upright in bed Baseline Vocal Quality: Normal Volitional Cough: Strong Volitional Swallow: Able to elicit    Oral/Motor/Sensory Function Overall Oral Motor/Sensory Function: Within functional limits   Ice Chips Ice chips: Not tested   Thin Liquid Thin Liquid: Impaired Presentation: Straw;Cup Pharyngeal  Phase Impairments: Cough - Delayed (larger boluses only)    Nectar Thick Nectar Thick Liquid: Not tested  Honey Thick Honey Thick Liquid: Not tested   Puree Puree: Within functional limits Presentation: Self Fed   Solid     Solid: Within functional limits Presentation: Self Fed Other Comments: suggested small bites d/t GERD hx      Pat Mykenna Viele,M.S., CCC-SLP 06/08/2022,1:18 PM

## 2022-06-08 NOTE — Progress Notes (Signed)
Rounding Note    Patient Name: George [name] Date of Encounter: 06/08/2022  [facility] Cardiologist: Nona Dell, MD   Subjective   Continues to hurt in his shoulders and neck area.  No clear evidence of discitis/osteomyelitis on MRI of the cervical and thoracic spine.  Scheduled for MRI of the sternoclavicular joints. No longer febrile.  Inpatient Medications    Scheduled Meds:  amLODipine  10 mg Oral Daily   aspirin EC  81 mg Oral Daily   atorvastatin  80 mg Oral Daily   carvedilol  12.5 mg Oral BID   enoxaparin (LOVENOX) injection  40 mg Subcutaneous Q24H   insulin aspart  0-20 Units Subcutaneous TID WC   insulin aspart  0-5 Units Subcutaneous QHS   insulin aspart  3 Units Subcutaneous TID WC   insulin glargine-yfgn  50 Units Subcutaneous BID   melatonin  3 mg Oral QHS   potassium chloride  40 mEq Oral Q4H   sodium chloride flush  3 mL Intravenous Q12H   Continuous Infusions:  sodium chloride     sodium chloride 1 mL/kg/hr (06/06/22 0420)    ceFAZolin (ANCEF) IV 2 g (06/08/22 0538)   PRN Meds: sodium chloride, acetaminophen, diclofenac Sodium, nitroGLYCERIN, ondansetron (ZOFRAN) IV, oxyCODONE, sodium chloride flush   Vital Signs    Vitals:   06/08/22 0046 06/08/22 0423 06/08/22 0719 06/08/22 0854  BP: 114/61 137/62 132/62 (!) 146/66  Pulse: 72 85 79   Resp: 18 18 18    Temp: (!) 97 F (36.1 C) 97.6 F (36.4 C) 98.3 F (36.8 C)   TempSrc: Oral Oral Oral   SpO2: 92% 96% 94%   Weight:  103.8 kg    Height:        Intake/Output Summary (Last 24 hours) at 06/08/2022 1141 Last data filed at 06/08/2022 0056 Gross per 24 hour  Intake --  Output 850 ml  Net -850 ml      06/08/2022    4:23 AM 06/07/2022    4:07 AM 06/06/2022    5:24 AM  Last 3 Weights  Weight (lbs) 228 lb 13.4 oz 229 lb 15 oz 236 lb 12.4 oz  Weight (kg) 103.8 kg 104.3 kg 107.4 kg      Telemetry    Normal sinus rhythm- Personally Reviewed  ECG    Normal sinus rhythm  with first-degree AV block, left axis deviation, left bundle branch block- Personally Reviewed  Physical Exam  Elderly, mildly overweight GEN: No acute distress.   Neck: No JVD Cardiac: RRR, paradoxically split second heart sound no murmurs, rubs, or gallops.  Respiratory: Clear to auscultation bilaterally. GI: Soft, nontender, non-distended  MS: No edema; No deformity. Neuro:  Nonfocal  Psych: Normal affect   Labs    High Sensitivity Troponin:  No results for input(s): "TROPONINIHS" in the last 720 hours.   Chemistry Recent Labs  Lab 06/06/22 0643 06/07/22 0659 06/08/22 0042  NA 134* 130* 131*  K 3.6 3.6 3.3*  CL 97* 95* 96*  CO2 26 23 26   GLUCOSE 310* 331* 263*  BUN 21 24* 28*  CREATININE 1.02 0.95 1.05  CALCIUM 8.8* 8.6* 8.3*  GFRNONAA >60 >60 >60  ANIONGAP 11 12 9     Lipids  Recent Labs  Lab 06/06/22 0643  CHOL 144  TRIG 285*  HDL 37*  LDLCALC 50  CHOLHDL 3.9    Hematology Recent Labs  Lab 06/06/22 0643 06/07/22 0718 06/08/22 0042  WBC 12.2* 9.0 6.6  RBC 4.67  4.43 4.07*  HGB 12.6* 11.9* 11.2*  HCT 37.2* 36.1* 33.3*  MCV 79.7* 81.5 81.8  MCH 27.0 26.9 27.5  MCHC 33.9 33.0 33.6  RDW 13.8 13.9 13.7  PLT 161 161 158   Thyroid No results for input(s): "TSH", "FREET4" in the last 168 hours.  BNPNo results for input(s): "BNP", "PROBNP" in the last 168 hours.  DDimer No results for input(s): "DDIMER" in the last 168 hours.   Radiology    MR CERVICAL SPINE W WO CONTRAST  Result Date: 06/07/2022 CLINICAL DATA:  Neck pain, infection suspected, no prior imaging concern for vertebral infection with MSSA bacteremia and severe new neck pain x 1 week; Mid-back pain, infection suspected, no prior imaging concern for vertebral infection with MSSA bacteremia and severe new neck pain x 1 week EXAM: MRI CERVICAL AND THORACIC SPINE WITHOUT AND WITH CONTRAST TECHNIQUE: Multiplanar and multiecho pulse sequences of the cervical spine, to include the craniocervical  junction and cervicothoracic junction, and the thoracic spine, were obtained without and with intravenous contrast. CONTRAST:  10mL GADAVIST GADOBUTROL 1 MMOL/ML IV SOLN COMPARISON:  None Available. FINDINGS: MRI CERVICAL SPINE FINDINGS Limitations: Assessment is slightly limited due to motion artifact. Alignment: There is straightening of the normal cervical lordosis. Vertebrae: There is minimal T2/stir hyperintense signal within the anterior osteophytes at C4 (series 5, image 7). There is also very contrast enhancement associated with this region (series 19, image 6-8). There is no abnormal signal in the intervertebral discs. Cord: Normal signal and morphology. Posterior Fossa, vertebral arteries, paraspinal tissues: There is nonspecific. Vertebral soft tissue edema spanning the C3-C5 vertebral bodies (series 5, image 9). There is also mild contrast enhancement in the region (series 19, image 9). Disc levels: There is moderate to severe spinal canal narrowing at C5-C6 secondary to a combination ligamentum flavum hypertrophy and a degenerative disc bulge MRI THORACIC SPINE FINDINGS Alignment:  Physiologic. Vertebrae: No fracture, evidence of discitis, or bone lesion. There is an osseous hemangioma at T8 Cord:  Normal signal and morphology. Paraspinal and other soft tissues: There is a T2 hyperintense lesion along the left lateral aspect of the T6 vertebral body (series 12, image 13) measuring 1.3 x 1.1 cm. Disc levels: No evidence of high-grade spinal canal stenosis. IMPRESSION: 1. Minimal T2/STIR hyperintense signal within the anterior osteophytes at C4 with associated contrast enhancement. This is favored to either be degenerative or post-traumatic in nature. Correlate for history of trauma. If there is no history of trauma early osteomyelitis/discitis cannot be excluded and a short term follow up MRI is recommended in 3 weeks to assess for interval change. 2. No evidence of discitis/osteomyelitis in the thoracic  spine. 3. Moderate to severe spinal canal narrowing at C5-C6 secondary to a combination of ligamentum flavum hypertrophy and a degenerative disc bulge. 4. T2 hyperintense lesion along the left lateral aspect of the T6 vertebral body measuring 1.3 x 1.1 cm. This is favored to represent a lymph node or a sympathetic chain ganglion. Recommend attention on follow up. Electronically Signed   By: Lorenza Cambridge M.D.   On: 06/07/2022 16:22   MR THORACIC SPINE W WO CONTRAST  Result Date: 06/07/2022 CLINICAL DATA:  Neck pain, infection suspected, no prior imaging concern for vertebral infection with MSSA bacteremia and severe new neck pain x 1 week; Mid-back pain, infection suspected, no prior imaging concern for vertebral infection with MSSA bacteremia and severe new neck pain x 1 week EXAM: MRI CERVICAL AND THORACIC SPINE WITHOUT AND WITH CONTRAST TECHNIQUE:  Multiplanar and multiecho pulse sequences of the cervical spine, to include the craniocervical junction and cervicothoracic junction, and the thoracic spine, were obtained without and with intravenous contrast. CONTRAST:  10mL GADAVIST GADOBUTROL 1 MMOL/ML IV SOLN COMPARISON:  None Available. FINDINGS: MRI CERVICAL SPINE FINDINGS Limitations: Assessment is slightly limited due to motion artifact. Alignment: There is straightening of the normal cervical lordosis. Vertebrae: There is minimal T2/stir hyperintense signal within the anterior osteophytes at C4 (series 5, image 7). There is also very contrast enhancement associated with this region (series 19, image 6-8). There is no abnormal signal in the intervertebral discs. Cord: Normal signal and morphology. Posterior Fossa, vertebral arteries, paraspinal tissues: There is nonspecific. Vertebral soft tissue edema spanning the C3-C5 vertebral bodies (series 5, image 9). There is also mild contrast enhancement in the region (series 19, image 9). Disc levels: There is moderate to severe spinal canal narrowing at C5-C6  secondary to a combination ligamentum flavum hypertrophy and a degenerative disc bulge MRI THORACIC SPINE FINDINGS Alignment:  Physiologic. Vertebrae: No fracture, evidence of discitis, or bone lesion. There is an osseous hemangioma at T8 Cord:  Normal signal and morphology. Paraspinal and other soft tissues: There is a T2 hyperintense lesion along the left lateral aspect of the T6 vertebral body (series 12, image 13) measuring 1.3 x 1.1 cm. Disc levels: No evidence of high-grade spinal canal stenosis. IMPRESSION: 1. Minimal T2/STIR hyperintense signal within the anterior osteophytes at C4 with associated contrast enhancement. This is favored to either be degenerative or post-traumatic in nature. Correlate for history of trauma. If there is no history of trauma early osteomyelitis/discitis cannot be excluded and a short term follow up MRI is recommended in 3 weeks to assess for interval change. 2. No evidence of discitis/osteomyelitis in the thoracic spine. 3. Moderate to severe spinal canal narrowing at C5-C6 secondary to a combination of ligamentum flavum hypertrophy and a degenerative disc bulge. 4. T2 hyperintense lesion along the left lateral aspect of the T6 vertebral body measuring 1.3 x 1.1 cm. This is favored to represent a lymph node or a sympathetic chain ganglion. Recommend attention on follow up. Electronically Signed   By: Lorenza Cambridge M.D.   On: 06/07/2022 16:22   MR SHOULDER LEFT WO CONTRAST  Result Date: 06/07/2022 CLINICAL DATA:  Chronic shoulder pain. Rotator cuff disorder suspected. EXAM: MRI OF THE LEFT SHOULDER WITHOUT CONTRAST TECHNIQUE: Multiplanar, multisequence MR imaging of the shoulder was performed. No intravenous contrast was administered. COMPARISON:  None Available. FINDINGS: Rotator cuff: There is moderate intermediate T2 signal and thickening tendinosis of the anterior greater than mid AP dimension of the supraspinatus tendon footprint. There is fluid bright signal indicating a  partial-thickness tear of the mid to anterior supraspinatus tendon in a region measuring up to approximately 14 mm in AP dimension (sagittal series 6 images 4 and 5). Posteriorly, this tear is a midsubstance tear and anteriorly it extends through the bursal tendon surface where it appears to measure up to 6 mm in AP dimension. No significant tendon retraction. Punctate 2-3 mm midsubstance tear within the anterior infraspinatus tendon insertion. Mild partial-thickness articular sided tear of the superior subscapularis tendon insertion. The teres minor is intact. Muscles: No rotator cuff muscle atrophy, fatty infiltration, or edema. Biceps long head: There is mild-to-moderate intermediate T2 signal and thickening tendinosis of the proximal long head of the biceps tendon. Acromioclavicular Joint: There are mild degenerative changes of the acromioclavicular joint including joint space narrowing, subchondral marrow edema, and peripheral osteophytosis.  Type III acromion, with minimal downsloping of the anterolateral acromion. Mild fluid within the subacromial/subdeltoid bursa. Glenohumeral Joint: Moderate thinning of the glenohumeral cartilage. No joint effusion. Labrum: Grossly intact, but evaluation is limited by lack of intraarticular fluid. Bones:  No acute fracture. Other: There is mild-to-moderate fluid tracking along the fascial planes of the superficial posterior aspect of the infraspinatus and teres minor, deep to the posterior deltoid muscle. There is moderate edema throughout the adjacent posterior aspect of the deltoid musculature (coronal series 3 images 1 through 6) overlying this fluid, and there is scattered decreased T1 and decreased T2 signal within the deltoid muscle edema, possibly from evolving subacute to chronic blood products. IMPRESSION: 1. Moderate anterior greater than mid supraspinatus tendinosis. Partial-thickness tear of the mid to anterior supraspinatus tendon measuring up to 14 mm in AP  dimension. 2. Punctate 2-3 mm midsubstance tear within the anterior infraspinatus tendon insertion. 3. Mild partial-thickness articular sided tear of the superior subscapularis tendon insertion. 4. Mild-to-moderate proximal long head of the biceps tendinosis. 5. Mild degenerative changes of the acromioclavicular joint. 6. Overall there appears to be an interstitial tear of the posterior deltoid muscle with moderate edema and possible mild blood products interposed within the muscle fibers. There is mild to moderate fluid tracking between the posterior deltoid muscle and the infraspinatus and teres minor muscles. Electronically Signed   By: Neita Garnet M.D.   On: 06/07/2022 14:33   DG Shoulder Left Port  Result Date: 06/06/2022 CLINICAL DATA:  Fever.  Pain. EXAM: LEFT SHOULDER COMPARISON:  None Available. FINDINGS: Single frontal view of the left shoulder. Mild acromioclavicular joint space narrowing and peripheral osteophytosis. Mild inferior glenohumeral joint space narrowing and peripheral osteophytosis. No acute fracture is seen. No dislocation. Status post median sternotomy. IMPRESSION: Mild acromioclavicular and glenohumeral osteoarthritis. Electronically Signed   By: Neita Garnet M.D.   On: 06/06/2022 13:20    Cardiac Studies   Nuclear stress test 05/08/2022    Findings are consistent with prior infarction. The study is intermediate risk due to reduced systolic function.   No ST deviation was noted.   LV perfusion is abnormal. There is no evidence of ischemia. There is evidence of infarction. Defect 1: There is a medium defect with moderate reduction in uptake present in the apical to basal (inferior wall) location(s) that is fixed. There is abnormal wall motion in the defect area. Consistent with infarction and artifact.   Left ventricular function is abnormal. Global function is moderately reduced. There were multiple regional abnormalities. Nuclear stress EF: 38 %. The left ventricular  ejection fraction is moderately decreased (30-44%). End diastolic cavity size is moderately enlarged. End systolic cavity size is moderately enlarged.   Prior study available for comparison from 10/16/2020.  No significant changes.  Echocardiogram 05/28/2022   1. Left ventricular ejection fraction, by estimation, is 50%. The left  ventricle has mildly decreased function. Left ventricular endocardial  border not optimally defined to evaluate regional wall motion. There is  moderate left ventricular hypertrophy.  Left ventricular diastolic parameters are indeterminate. Elevated left  atrial pressure.   2. Right ventricular systolic function is normal. The right ventricular  size is normal. Tricuspid regurgitation signal is inadequate for assessing  PA pressure.   3. The mitral valve is abnormal. Mild mitral valve regurgitation. No  evidence of mitral stenosis.   4. The aortic valve is tricuspid. There is mild calcification of the  aortic valve. There is mild thickening of the aortic valve. Aortic valve  regurgitation is trivial. No aortic stenosis is present.   5. Aortic dilatation noted. There is mild dilatation of the aortic root,  measuring 42 mm. There is mild dilatation of the ascending aorta,  measuring 40 mm.   Comparison(s): Echocardiogram done 07/05/20 showed an EF of 40-45%.   Patient Profile     77 y.o. 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  Assessment & Plan    CAD: Symptoms are very atypical for angina.  Very recent nuclear perfusion study does not show evidence to suggest active ischemia.  Elevation in high-sensitivity troponin was marginal at [facility] and high-sensitivity troponin is completely normal here.  No plan for coronary angiography.  Symptoms are not likely to be cardiac.  On aspirin, beta-blocker, statin. CHF: With mildly depressed left ventricular ejection fraction.  Appears euvolemic and does not have symptoms of heart failure exacerbation. Staphylococcal bacteremia: Only positive blood cultures from 5/22, subsequent cultures from 524 are negative.  It is possible that he may need a TEE to exclude endocarditis.  He does not appear to have clear-cut evidence of infectious osteomyelitis or discitis of the spine, although he has a lot of pain in that area.     For questions or updates, please contact [facility] Please consult [email or web address] for contact info under        Signed, Thurmon Fair, MD  06/08/2022, 11:41 AM

## 2022-06-08 NOTE — Progress Notes (Signed)
PROGRESS NOTE    BURYL SCHNEIDERS  GUY:[id number] DOB: 1946-01-13 DOA: 06/05/2022 PCP: Kirstie Peri, MD   Brief Narrative:  George [name] is a 77 y.o. male with past medical history of AAA, chronic pain, CAD s/p CABG, HTN, HLD, DM, chronic systolic CHF, and prostate CA who presented with chest pain.  Recent stress test on 05/08/22 showed prior infarction and was intermediate risk with EF 30-44%.  He was admitted under cardiology with NSTEMI with plan for cardiac catheterization.  he spiked a fever to 102.  His only complaint is L shoulder pain that radiates into his L neck and jaw and into his L chest.  Hospitalist were consulted for that.  Eventually Ortho and ID were consulted.  Assessment & Plan:   Principal Problem:   MSSA bacteremia Active Problems:   HTN (hypertension)   Uncontrolled type 2 diabetes mellitus with hyperglycemia (HCC)   Mixed hyperlipidemia   NSTEMI (non-ST elevated myocardial infarction) (HCC)   Sepsis due to methicillin susceptible Staphylococcus aureus (MSSA) without acute organ dysfunction (HCC)   Acute pain of left shoulder  NSTEMI (non-ST elevated myocardial infarction) (HCC) ruled out -Patient with prior h/o CAD s/p CABG presenting with chest pain and uptrending troponin, initially thought to be consistent with NSTEMI.  She was started on heparin drip and plan was cardiac cath but then it was opined by cardiology that there is very low concern of ACS so heparin was discontinued and no plan for cardiac cath and cardiology signed off on 06/07/2022.   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  Complains of neck pain and left shoulder pain.  Ortho was consulted.  They did arthrocentesis of the left shoulder but it was a dry tap.  MRI spine shows some concern of possible C4 discitis but no infection in the left shoulder.  MRI left shoulder shows multiple torn ligaments.  Orthopedics to manage that.  May need repeat MRI of the  cervical spine.  He is on cefazolin and ID is managing.  Reportedly patient felt relief of pain with oxycodone.  Will place her on oxycodone as needed.   Mixed hyperlipidemia -Continue high-dose Lipitor   Uncontrolled type 2 diabetes mellitus with hyperglycemia (HCC) Appears to be taking 70 units of long-acting insulin at night however he has been on 40 units of Semglee twice daily starting yesterday but still significantly hyperglycemic, will increase that to 50 units twice daily and continue SSI.    HTN (hypertension) -Blood pressure very well-controlled.  Continue amlodipine and carvedilol -He was also on Toprol XL and Catapres at home but these are on hold currently.  Defer to cardiology.  Hypokalemia: Will replace.  DVT prophylaxis: enoxaparin (LOVENOX) injection 40 mg Start: 06/08/22 1000Heparin drip   Code Status: Full Code  Family Communication: granddaughter present at bedside.  Plan of care discussed with patient in length and he/she verbalized understanding and agreed with it.  Status is: Inpatient Remains inpatient appropriate because: Needs final plan by ID and orthopedics.  May need placement to SNF.  Consulting PT OT.   Estimated body mass index is 27.85 kg/m as calculated from the following:   Height as of this encounter: 6\' 4"  (1.93 m).   Weight as of this encounter: 103.8 kg.    Nutritional Assessment: Body mass index is 27.85 kg/m.Marland Kitchen Seen by dietician.  I agree with the assessment and plan as outlined below: Nutrition Status:        . Skin Assessment: I have examined  the patient's skin and I agree with the wound assessment as performed by the wound care RN as outlined below:    Consultants:  ID and orthopedics Cardiology-signed off  Procedures:  As above  Antimicrobials:  Anti-infectives (From admission, onward)    Start     Dose/Rate Route Frequency Ordered Stop   06/06/22 1430  ceFAZolin (ANCEF) IVPB 2g/100 mL premix        2 g 200 mL/hr over  30 Minutes Intravenous Every 8 hours 06/06/22 1339     06/06/22 1400  doxycycline (VIBRA-TABS) tablet 100 mg  Status:  Discontinued        100 mg Oral Every 12 hours 06/06/22 1222 06/06/22 1449   06/06/22 1300  vancomycin (VANCOREADY) IVPB 1250 mg/250 mL  Status:  Discontinued        1,250 mg 166.7 mL/hr over 90 Minutes Intravenous Every 12 hours 06/05/22 2247 06/06/22 1339   06/06/22 0600  piperacillin-tazobactam (ZOSYN) IVPB 3.375 g  Status:  Discontinued        3.375 g 12.5 mL/hr over 240 Minutes Intravenous Every 8 hours 06/05/22 2247 06/06/22 1339   06/05/22 2345  vancomycin (VANCOREADY) IVPB 2000 mg/400 mL        2,000 mg 200 mL/hr over 120 Minutes Intravenous  Once 06/05/22 2247 06/06/22 0820   06/05/22 2345  piperacillin-tazobactam (ZOSYN) IVPB 3.375 g        3.375 g 100 mL/hr over 30 Minutes Intravenous  Once 06/05/22 2247 06/06/22 1004         Subjective: Seen and examined.  Granddaughter at the bedside.  Patient complains of neck pain and shoulder pain.  Slightly better than yesterday.  No other complaint.  Objective: Vitals:   06/07/22 1103 06/07/22 1949 06/08/22 0046 06/08/22 0423  BP: (!) 150/70 (!) 164/71 114/61 137/62  Pulse: 94 (!) 102 72 85  Resp: 18 18 18 18   Temp:  (!) 101 F (38.3 C) (!) 97 F (36.1 C) 97.6 F (36.4 C)  TempSrc:  Oral Oral Oral  SpO2: 93% 90% 92% 96%  Weight:    103.8 kg  Height:        Intake/Output Summary (Last 24 hours) at 06/08/2022 0755 Last data filed at 06/08/2022 0056 Gross per 24 hour  Intake --  Output 850 ml  Net -850 ml    Filed Weights   06/06/22 0524 06/07/22 0407 06/08/22 0423  Weight: 107.4 kg 104.3 kg 103.8 kg    Examination:  General exam: Appears calm and comfortable  Respiratory system: Clear to auscultation. Respiratory effort normal. Cardiovascular system: S1 & S2 heard, RRR. No JVD, murmurs, rubs, gallops or clicks. No pedal edema. Gastrointestinal system: Abdomen is nondistended, soft and nontender.  No organomegaly or masses felt. Normal bowel sounds heard. Central nervous system: Alert and oriented. No focal neurological deficits. Extremities: Symmetric 5 x 5 power. Skin: No rashes, lesions or ulcers.  Psychiatry: Judgement and insight appear normal. Mood & affect appropriate.   Data Reviewed: I have personally reviewed following labs and imaging studies  CBC: Recent Labs  Lab 06/05/22 2226 06/06/22 0643 06/07/22 0718 06/08/22 0042  WBC 13.0* 12.2* 9.0 6.6  NEUTROABS 11.3*  --   --   --   HGB 12.9* 12.6* 11.9* 11.2*  HCT 38.7* 37.2* 36.1* 33.3*  MCV 81.0 79.7* 81.5 81.8  PLT 166 161 161 158    Basic Metabolic Panel: Recent Labs  Lab 06/05/22 2226 06/06/22 0643 06/07/22 0659 06/08/22 0042  NA 131* 134* 130*  131*  K 4.0 3.6 3.6 3.3*  CL 94* 97* 95* 96*  CO2 22 26 23 26   GLUCOSE 398* 310* 331* 263*  BUN 17 21 24* 28*  CREATININE 1.05 1.02 0.95 1.05  CALCIUM 8.8* 8.8* 8.6* 8.3*    GFR: Estimated Creatinine Clearance: 72.3 mL/min (by C-G formula based on SCr of 1.05 mg/dL). Liver Function Tests: No results for input(s): "AST", "ALT", "ALKPHOS", "BILITOT", "PROT", "ALBUMIN" in the last 168 hours. No results for input(s): "LIPASE", "AMYLASE" in the last 168 hours. No results for input(s): "AMMONIA" in the last 168 hours. Coagulation Profile: No results for input(s): "INR", "PROTIME" in the last 168 hours. Cardiac Enzymes: No results for input(s): "CKTOTAL", "CKMB", "CKMBINDEX", "TROPONINI" in the last 168 hours. BNP (last 3 results) No results for input(s): "PROBNP" in the last 8760 hours. HbA1C: Recent Labs    06/06/22 0643  HGBA1C 8.6*    CBG: Recent Labs  Lab 06/07/22 0618 06/07/22 1102 06/07/22 1609 06/07/22 2114 06/08/22 0617  GLUCAP 301* 292* 209* 215* 292*    Lipid Profile: Recent Labs    06/06/22 0643  CHOL 144  HDL 37*  LDLCALC 50  TRIG 409*  CHOLHDL 3.9    Thyroid Function Tests: No results for input(s): "TSH", "T4TOTAL",  "FREET4", "T3FREE", "THYROIDAB" in the last 72 hours. Anemia Panel: No results for input(s): "VITAMINB12", "FOLATE", "FERRITIN", "TIBC", "IRON", "RETICCTPCT" in the last 72 hours. Sepsis Labs: No results for input(s): "PROCALCITON", "LATICACIDVEN" in the last 168 hours.  Recent Results (from the past 240 hour(s))  Culture, blood (Routine X 2) w Reflex to ID Panel     Status: Abnormal (Preliminary result)   Collection Time: 06/05/22 10:26 PM   Specimen: BLOOD LEFT HAND  Result Value Ref Range Status   Specimen Description BLOOD LEFT HAND  Final   Special Requests NONE  Final   Culture  Setup Time   Final    GRAM POSITIVE COCCI IN CLUSTERS IN BOTH AEROBIC AND ANAEROBIC BOTTLES CRITICAL VALUE NOTED.  VALUE IS CONSISTENT WITH PREVIOUSLY REPORTED AND CALLED VALUE. Performed at [facility], [address] [place], [place] [address]    Culture STAPHYLOCOCCUS AUREUS (A)  Final   Report Status PENDING  Incomplete  Culture, blood (Routine X 2) w Reflex to ID Panel     Status: Abnormal (Preliminary result)   Collection Time: 06/05/22 10:26 PM   Specimen: BLOOD RIGHT HAND  Result Value Ref Range Status   Specimen Description BLOOD RIGHT HAND  Final   Special Requests   Final    BOTTLES DRAWN AEROBIC AND ANAEROBIC Blood Culture adequate volume   Culture  Setup Time   Final    GRAM POSITIVE COCCI IN CLUSTERS IN BOTH AEROBIC AND ANAEROBIC BOTTLES CRITICAL RESULT CALLED TO, READ BACK BY AND VERIFIED WITH: PHARMD A PAYTES [id number] AT 1325 BY CM    Culture (A)  Final    STAPHYLOCOCCUS AUREUS SUSCEPTIBILITIES TO FOLLOW Performed at [facility], [address], [place], [place] [address]    Report Status PENDING  Incomplete  Blood Culture ID Panel (Reflexed)     Status: Abnormal   Collection Time: 06/05/22 10:26 PM  Result Value Ref Range Status   Enterococcus faecalis NOT DETECTED NOT DETECTED Final   Enterococcus Faecium NOT DETECTED NOT DETECTED Final   Listeria monocytogenes  NOT DETECTED NOT DETECTED Final   Staphylococcus species DETECTED (A) NOT DETECTED Final    Comment: CRITICAL RESULT CALLED TO, READ BACK BY AND  VERIFIED WITH: PHARMD A PAYTES [id number] AT 1325 BY CM    Staphylococcus aureus (BCID) DETECTED (A) NOT DETECTED Final    Comment: CRITICAL RESULT CALLED TO, READ BACK BY AND VERIFIED WITH: PHARMD A PAYTES [id number] AT 1325 BY CM    Staphylococcus epidermidis NOT DETECTED NOT DETECTED Final   Staphylococcus lugdunensis NOT DETECTED NOT DETECTED Final   Streptococcus species NOT DETECTED NOT DETECTED Final   Streptococcus agalactiae NOT DETECTED NOT DETECTED Final   Streptococcus pneumoniae NOT DETECTED NOT DETECTED Final   Streptococcus pyogenes NOT DETECTED NOT DETECTED Final   A.calcoaceticus-baumannii NOT DETECTED NOT DETECTED Final   Bacteroides fragilis NOT DETECTED NOT DETECTED Final   Enterobacterales NOT DETECTED NOT DETECTED Final   Enterobacter cloacae complex NOT DETECTED NOT DETECTED Final   Escherichia coli NOT DETECTED NOT DETECTED Final   Klebsiella aerogenes NOT DETECTED NOT DETECTED Final   Klebsiella oxytoca NOT DETECTED NOT DETECTED Final   Klebsiella pneumoniae NOT DETECTED NOT DETECTED Final   Proteus species NOT DETECTED NOT DETECTED Final   Salmonella species NOT DETECTED NOT DETECTED Final   Serratia marcescens NOT DETECTED NOT DETECTED Final   Haemophilus influenzae NOT DETECTED NOT DETECTED Final   Neisseria meningitidis NOT DETECTED NOT DETECTED Final   Pseudomonas aeruginosa NOT DETECTED NOT DETECTED Final   Stenotrophomonas maltophilia NOT DETECTED NOT DETECTED Final   Candida albicans NOT DETECTED NOT DETECTED Final   Candida auris NOT DETECTED NOT DETECTED Final   Candida glabrata NOT DETECTED NOT DETECTED Final   Candida krusei NOT DETECTED NOT DETECTED Final   Candida parapsilosis NOT DETECTED NOT DETECTED Final   Candida tropicalis NOT DETECTED NOT DETECTED Final   Cryptococcus neoformans/gattii NOT DETECTED  NOT DETECTED Final   Meth resistant mecA/C and MREJ NOT DETECTED NOT DETECTED Final    Comment: Performed at [facility], [address], [place], [place] [address]     Radiology Studies: MR CERVICAL SPINE W WO CONTRAST  Result Date: 06/07/2022 CLINICAL DATA:  Neck pain, infection suspected, no prior imaging concern for vertebral infection with MSSA bacteremia and severe new neck pain x 1 week; Mid-back pain, infection suspected, no prior imaging concern for vertebral infection with MSSA bacteremia and severe new neck pain x 1 week EXAM: MRI CERVICAL AND THORACIC SPINE WITHOUT AND WITH CONTRAST TECHNIQUE: Multiplanar and multiecho pulse sequences of the cervical spine, to include the craniocervical junction and cervicothoracic junction, and the thoracic spine, were obtained without and with intravenous contrast. CONTRAST:  10mL GADAVIST GADOBUTROL 1 MMOL/ML IV SOLN COMPARISON:  None Available. FINDINGS: MRI CERVICAL SPINE FINDINGS Limitations: Assessment is slightly limited due to motion artifact. Alignment: There is straightening of the normal cervical lordosis. Vertebrae: There is minimal T2/stir hyperintense signal within the anterior osteophytes at C4 (series 5, image 7). There is also very contrast enhancement associated with this region (series 19, image 6-8). There is no abnormal signal in the intervertebral discs. Cord: Normal signal and morphology. Posterior Fossa, vertebral arteries, paraspinal tissues: There is nonspecific. Vertebral soft tissue edema spanning the C3-C5 vertebral bodies (series 5, image 9). There is also mild contrast enhancement in the region (series 19, image 9). Disc levels: There is moderate to severe spinal canal narrowing at C5-C6 secondary to a combination ligamentum flavum hypertrophy and a degenerative disc bulge MRI THORACIC SPINE FINDINGS Alignment:  Physiologic. Vertebrae: No fracture, evidence of discitis, or bone lesion. There is an osseous hemangioma at T8  Cord:  Normal signal and morphology. Paraspinal and other  soft tissues: There is a T2 hyperintense lesion along the left lateral aspect of the T6 vertebral body (series 12, image 13) measuring 1.3 x 1.1 cm. Disc levels: No evidence of high-grade spinal canal stenosis. IMPRESSION: 1. Minimal T2/STIR hyperintense signal within the anterior osteophytes at C4 with associated contrast enhancement. This is favored to either be degenerative or post-traumatic in nature. Correlate for history of trauma. If there is no history of trauma early osteomyelitis/discitis cannot be excluded and a short term follow up MRI is recommended in 3 weeks to assess for interval change. 2. No evidence of discitis/osteomyelitis in the thoracic spine. 3. Moderate to severe spinal canal narrowing at C5-C6 secondary to a combination of ligamentum flavum hypertrophy and a degenerative disc bulge. 4. T2 hyperintense lesion along the left lateral aspect of the T6 vertebral body measuring 1.3 x 1.1 cm. This is favored to represent a lymph node or a sympathetic chain ganglion. Recommend attention on follow up. Electronically Signed   By: Lorenza Cambridge M.D.   On: 06/07/2022 16:22   MR THORACIC SPINE W WO CONTRAST  Result Date: 06/07/2022 CLINICAL DATA:  Neck pain, infection suspected, no prior imaging concern for vertebral infection with MSSA bacteremia and severe new neck pain x 1 week; Mid-back pain, infection suspected, no prior imaging concern for vertebral infection with MSSA bacteremia and severe new neck pain x 1 week EXAM: MRI CERVICAL AND THORACIC SPINE WITHOUT AND WITH CONTRAST TECHNIQUE: Multiplanar and multiecho pulse sequences of the cervical spine, to include the craniocervical junction and cervicothoracic junction, and the thoracic spine, were obtained without and with intravenous contrast. CONTRAST:  10mL GADAVIST GADOBUTROL 1 MMOL/ML IV SOLN COMPARISON:  None Available. FINDINGS: MRI CERVICAL SPINE FINDINGS Limitations: Assessment  is slightly limited due to motion artifact. Alignment: There is straightening of the normal cervical lordosis. Vertebrae: There is minimal T2/stir hyperintense signal within the anterior osteophytes at C4 (series 5, image 7). There is also very contrast enhancement associated with this region (series 19, image 6-8). There is no abnormal signal in the intervertebral discs. Cord: Normal signal and morphology. Posterior Fossa, vertebral arteries, paraspinal tissues: There is nonspecific. Vertebral soft tissue edema spanning the C3-C5 vertebral bodies (series 5, image 9). There is also mild contrast enhancement in the region (series 19, image 9). Disc levels: There is moderate to severe spinal canal narrowing at C5-C6 secondary to a combination ligamentum flavum hypertrophy and a degenerative disc bulge MRI THORACIC SPINE FINDINGS Alignment:  Physiologic. Vertebrae: No fracture, evidence of discitis, or bone lesion. There is an osseous hemangioma at T8 Cord:  Normal signal and morphology. Paraspinal and other soft tissues: There is a T2 hyperintense lesion along the left lateral aspect of the T6 vertebral body (series 12, image 13) measuring 1.3 x 1.1 cm. Disc levels: No evidence of high-grade spinal canal stenosis. IMPRESSION: 1. Minimal T2/STIR hyperintense signal within the anterior osteophytes at C4 with associated contrast enhancement. This is favored to either be degenerative or post-traumatic in nature. Correlate for history of trauma. If there is no history of trauma early osteomyelitis/discitis cannot be excluded and a short term follow up MRI is recommended in 3 weeks to assess for interval change. 2. No evidence of discitis/osteomyelitis in the thoracic spine. 3. Moderate to severe spinal canal narrowing at C5-C6 secondary to a combination of ligamentum flavum hypertrophy and a degenerative disc bulge. 4. T2 hyperintense lesion along the left lateral aspect of the T6 vertebral body measuring 1.3 x 1.1 cm.  This is  favored to represent a lymph node or a sympathetic chain ganglion. Recommend attention on follow up. Electronically Signed   By: Lorenza Cambridge M.D.   On: 06/07/2022 16:22   MR SHOULDER LEFT WO CONTRAST  Result Date: 06/07/2022 CLINICAL DATA:  Chronic shoulder pain. Rotator cuff disorder suspected. EXAM: MRI OF THE LEFT SHOULDER WITHOUT CONTRAST TECHNIQUE: Multiplanar, multisequence MR imaging of the shoulder was performed. No intravenous contrast was administered. COMPARISON:  None Available. FINDINGS: Rotator cuff: There is moderate intermediate T2 signal and thickening tendinosis of the anterior greater than mid AP dimension of the supraspinatus tendon footprint. There is fluid bright signal indicating a partial-thickness tear of the mid to anterior supraspinatus tendon in a region measuring up to approximately 14 mm in AP dimension (sagittal series 6 images 4 and 5). Posteriorly, this tear is a midsubstance tear and anteriorly it extends through the bursal tendon surface where it appears to measure up to 6 mm in AP dimension. No significant tendon retraction. Punctate 2-3 mm midsubstance tear within the anterior infraspinatus tendon insertion. Mild partial-thickness articular sided tear of the superior subscapularis tendon insertion. The teres minor is intact. Muscles: No rotator cuff muscle atrophy, fatty infiltration, or edema. Biceps long head: There is mild-to-moderate intermediate T2 signal and thickening tendinosis of the proximal long head of the biceps tendon. Acromioclavicular Joint: There are mild degenerative changes of the acromioclavicular joint including joint space narrowing, subchondral marrow edema, and peripheral osteophytosis. Type III acromion, with minimal downsloping of the anterolateral acromion. Mild fluid within the subacromial/subdeltoid bursa. Glenohumeral Joint: Moderate thinning of the glenohumeral cartilage. No joint effusion. Labrum: Grossly intact, but evaluation is  limited by lack of intraarticular fluid. Bones:  No acute fracture. Other: There is mild-to-moderate fluid tracking along the fascial planes of the superficial posterior aspect of the infraspinatus and teres minor, deep to the posterior deltoid muscle. There is moderate edema throughout the adjacent posterior aspect of the deltoid musculature (coronal series 3 images 1 through 6) overlying this fluid, and there is scattered decreased T1 and decreased T2 signal within the deltoid muscle edema, possibly from evolving subacute to chronic blood products. IMPRESSION: 1. Moderate anterior greater than mid supraspinatus tendinosis. Partial-thickness tear of the mid to anterior supraspinatus tendon measuring up to 14 mm in AP dimension. 2. Punctate 2-3 mm midsubstance tear within the anterior infraspinatus tendon insertion. 3. Mild partial-thickness articular sided tear of the superior subscapularis tendon insertion. 4. Mild-to-moderate proximal long head of the biceps tendinosis. 5. Mild degenerative changes of the acromioclavicular joint. 6. Overall there appears to be an interstitial tear of the posterior deltoid muscle with moderate edema and possible mild blood products interposed within the muscle fibers. There is mild to moderate fluid tracking between the posterior deltoid muscle and the infraspinatus and teres minor muscles. Electronically Signed   By: Neita Garnet M.D.   On: 06/07/2022 14:33   DG Shoulder Left Port  Result Date: 06/06/2022 CLINICAL DATA:  Fever.  Pain. EXAM: LEFT SHOULDER COMPARISON:  None Available. FINDINGS: Single frontal view of the left shoulder. Mild acromioclavicular joint space narrowing and peripheral osteophytosis. Mild inferior glenohumeral joint space narrowing and peripheral osteophytosis. No acute fracture is seen. No dislocation. Status post median sternotomy. IMPRESSION: Mild acromioclavicular and glenohumeral osteoarthritis. Electronically Signed   By: Neita Garnet M.D.    On: 06/06/2022 13:20    Scheduled Meds:  amLODipine  10 mg Oral Daily   aspirin EC  81 mg Oral Daily   atorvastatin  80  mg Oral Daily   carvedilol  12.5 mg Oral BID   enoxaparin (LOVENOX) injection  40 mg Subcutaneous Q24H   insulin aspart  0-20 Units Subcutaneous TID WC   insulin aspart  0-5 Units Subcutaneous QHS   insulin aspart  3 Units Subcutaneous TID WC   insulin glargine-yfgn  50 Units Subcutaneous BID   melatonin  3 mg Oral QHS   potassium chloride  40 mEq Oral Q4H   sodium chloride flush  3 mL Intravenous Q12H   Continuous Infusions:  sodium chloride     sodium chloride 1 mL/kg/hr (06/06/22 0420)    ceFAZolin (ANCEF) IV 2 g (06/08/22 0538)     LOS: 3 days   Hughie Closs, MD [facility]  06/08/2022, 7:55 AM   *Please note that this is a verbal dictation therefore any spelling or grammatical errors are due to the "Dragon [employer] One" system interpretation.  Please page via Amion and do not message via secure chat for urgent patient care matters. Secure chat can be used for non urgent patient care matters.  How to contact the [facility] Attending or Consulting provider 7A - 7P or covering provider during after hours 7P -7A, for this patient?  Check the care team in [facility] and look for a) attending/consulting [facility] provider listed and b) the [facility] team listed. Page or secure chat 7A-7P. Log into [email or web address] and use [place]'s universal password to access. If you do not have the password, please contact the hospital operator. Locate the [facility] provider you are looking for under [facility] and page to a number that you can be directly reached. If you still have difficulty reaching the provider, please page the [facility] (Director on Call) for the Hospitalists listed on amion for assistance.

## 2022-06-08 NOTE — H&P (View-Only) (Signed)
 Rounding Note    Patient Name: George [name] Date of Encounter: 06/08/2022  [facility] Cardiologist: Samuel McDowell, MD   Subjective   Continues to hurt in his shoulders and neck area.  No clear evidence of discitis/osteomyelitis on MRI of the cervical and thoracic spine.  Scheduled for MRI of the sternoclavicular joints. No longer febrile.  Inpatient Medications    Scheduled Meds:  amLODipine  10 mg Oral Daily   aspirin EC  81 mg Oral Daily   atorvastatin  80 mg Oral Daily   carvedilol  12.5 mg Oral BID   enoxaparin (LOVENOX) injection  40 mg Subcutaneous Q24H   insulin aspart  0-20 Units Subcutaneous TID WC   insulin aspart  0-5 Units Subcutaneous QHS   insulin aspart  3 Units Subcutaneous TID WC   insulin glargine-yfgn  50 Units Subcutaneous BID   melatonin  3 mg Oral QHS   potassium chloride  40 mEq Oral Q4H   sodium chloride flush  3 mL Intravenous Q12H   Continuous Infusions:  sodium chloride     sodium chloride 1 mL/kg/hr (06/06/22 0420)    ceFAZolin (ANCEF) IV 2 g (06/08/22 0538)   PRN Meds: sodium chloride, acetaminophen, diclofenac Sodium, nitroGLYCERIN, ondansetron (ZOFRAN) IV, oxyCODONE, sodium chloride flush   Vital Signs    Vitals:   06/08/22 0046 06/08/22 0423 06/08/22 0719 06/08/22 0854  BP: 114/61 137/62 132/62 (!) 146/66  Pulse: 72 85 79   Resp: 18 18 18   Temp: (!) 97 F (36.1 C) 97.6 F (36.4 C) 98.3 F (36.8 C)   TempSrc: Oral Oral Oral   SpO2: 92% 96% 94%   Weight:  103.8 kg    Height:        Intake/Output Summary (Last 24 hours) at 06/08/2022 1141 Last data filed at 06/08/2022 0056 Gross per 24 hour  Intake --  Output 850 ml  Net -850 ml      06/08/2022    4:23 AM 06/07/2022    4:07 AM 06/06/2022    5:24 AM  Last 3 Weights  Weight (lbs) 228 lb 13.4 oz 229 lb 15 oz 236 lb 12.4 oz  Weight (kg) 103.8 kg 104.3 kg 107.4 kg      Telemetry    Normal sinus rhythm- Personally Reviewed  ECG    Normal sinus rhythm  with first-degree AV block, left axis deviation, left bundle branch block- Personally Reviewed  Physical Exam  Elderly, mildly overweight GEN: No acute distress.   Neck: No JVD Cardiac: RRR, paradoxically split second heart sound no murmurs, rubs, or gallops.  Respiratory: Clear to auscultation bilaterally. GI: Soft, nontender, non-distended  MS: No edema; No deformity. Neuro:  Nonfocal  Psych: Normal affect   Labs    High Sensitivity Troponin:  No results for input(s): "TROPONINIHS" in the last 720 hours.   Chemistry Recent Labs  Lab 06/06/22 0643 06/07/22 0659 06/08/22 0042  NA 134* 130* 131*  K 3.6 3.6 3.3*  CL 97* 95* 96*  CO2 26 23 26  GLUCOSE 310* 331* 263*  BUN 21 24* 28*  CREATININE 1.02 0.95 1.05  CALCIUM 8.8* 8.6* 8.3*  GFRNONAA >60 >60 >60  ANIONGAP 11 12 9    Lipids  Recent Labs  Lab 06/06/22 0643  CHOL 144  TRIG 285*  HDL 37*  LDLCALC 50  CHOLHDL 3.9    Hematology Recent Labs  Lab 06/06/22 0643 06/07/22 0718 06/08/22 0042  WBC 12.2* 9.0 6.6  RBC 4.67   4.43 4.07*  HGB 12.6* 11.9* 11.2*  HCT 37.2* 36.1* 33.3*  MCV 79.7* 81.5 81.8  MCH 27.0 26.9 27.5  MCHC 33.9 33.0 33.6  RDW 13.8 13.9 13.7  PLT 161 161 158   Thyroid No results for input(s): "TSH", "FREET4" in the last 168 hours.  BNPNo results for input(s): "BNP", "PROBNP" in the last 168 hours.  DDimer No results for input(s): "DDIMER" in the last 168 hours.   Radiology    MR CERVICAL SPINE W WO CONTRAST  Result Date: 06/07/2022 CLINICAL DATA:  Neck pain, infection suspected, no prior imaging concern for vertebral infection with MSSA bacteremia and severe new neck pain x 1 week; Mid-back pain, infection suspected, no prior imaging concern for vertebral infection with MSSA bacteremia and severe new neck pain x 1 week EXAM: MRI CERVICAL AND THORACIC SPINE WITHOUT AND WITH CONTRAST TECHNIQUE: Multiplanar and multiecho pulse sequences of the cervical spine, to include the craniocervical  junction and cervicothoracic junction, and the thoracic spine, were obtained without and with intravenous contrast. CONTRAST:  10mL GADAVIST GADOBUTROL 1 MMOL/ML IV SOLN COMPARISON:  None Available. FINDINGS: MRI CERVICAL SPINE FINDINGS Limitations: Assessment is slightly limited due to motion artifact. Alignment: There is straightening of the normal cervical lordosis. Vertebrae: There is minimal T2/stir hyperintense signal within the anterior osteophytes at C4 (series 5, image 7). There is also very contrast enhancement associated with this region (series 19, image 6-8). There is no abnormal signal in the intervertebral discs. Cord: Normal signal and morphology. Posterior Fossa, vertebral arteries, paraspinal tissues: There is nonspecific. Vertebral soft tissue edema spanning the C3-C5 vertebral bodies (series 5, image 9). There is also mild contrast enhancement in the region (series 19, image 9). Disc levels: There is moderate to severe spinal canal narrowing at C5-C6 secondary to a combination ligamentum flavum hypertrophy and a degenerative disc bulge MRI THORACIC SPINE FINDINGS Alignment:  Physiologic. Vertebrae: No fracture, evidence of discitis, or bone lesion. There is an osseous hemangioma at T8 Cord:  Normal signal and morphology. Paraspinal and other soft tissues: There is a T2 hyperintense lesion along the left lateral aspect of the T6 vertebral body (series 12, image 13) measuring 1.3 x 1.1 cm. Disc levels: No evidence of high-grade spinal canal stenosis. IMPRESSION: 1. Minimal T2/STIR hyperintense signal within the anterior osteophytes at C4 with associated contrast enhancement. This is favored to either be degenerative or post-traumatic in nature. Correlate for history of trauma. If there is no history of trauma early osteomyelitis/discitis cannot be excluded and a short term follow up MRI is recommended in 3 weeks to assess for interval change. 2. No evidence of discitis/osteomyelitis in the thoracic  spine. 3. Moderate to severe spinal canal narrowing at C5-C6 secondary to a combination of ligamentum flavum hypertrophy and a degenerative disc bulge. 4. T2 hyperintense lesion along the left lateral aspect of the T6 vertebral body measuring 1.3 x 1.1 cm. This is favored to represent a lymph node or a sympathetic chain ganglion. Recommend attention on follow up. Electronically Signed   By: Hemant  Desai M.D.   On: 06/07/2022 16:22   MR THORACIC SPINE W WO CONTRAST  Result Date: 06/07/2022 CLINICAL DATA:  Neck pain, infection suspected, no prior imaging concern for vertebral infection with MSSA bacteremia and severe new neck pain x 1 week; Mid-back pain, infection suspected, no prior imaging concern for vertebral infection with MSSA bacteremia and severe new neck pain x 1 week EXAM: MRI CERVICAL AND THORACIC SPINE WITHOUT AND WITH CONTRAST TECHNIQUE:   Multiplanar and multiecho pulse sequences of the cervical spine, to include the craniocervical junction and cervicothoracic junction, and the thoracic spine, were obtained without and with intravenous contrast. CONTRAST:  10mL GADAVIST GADOBUTROL 1 MMOL/ML IV SOLN COMPARISON:  None Available. FINDINGS: MRI CERVICAL SPINE FINDINGS Limitations: Assessment is slightly limited due to motion artifact. Alignment: There is straightening of the normal cervical lordosis. Vertebrae: There is minimal T2/stir hyperintense signal within the anterior osteophytes at C4 (series 5, image 7). There is also very contrast enhancement associated with this region (series 19, image 6-8). There is no abnormal signal in the intervertebral discs. Cord: Normal signal and morphology. Posterior Fossa, vertebral arteries, paraspinal tissues: There is nonspecific. Vertebral soft tissue edema spanning the C3-C5 vertebral bodies (series 5, image 9). There is also mild contrast enhancement in the region (series 19, image 9). Disc levels: There is moderate to severe spinal canal narrowing at C5-C6  secondary to a combination ligamentum flavum hypertrophy and a degenerative disc bulge MRI THORACIC SPINE FINDINGS Alignment:  Physiologic. Vertebrae: No fracture, evidence of discitis, or bone lesion. There is an osseous hemangioma at T8 Cord:  Normal signal and morphology. Paraspinal and other soft tissues: There is a T2 hyperintense lesion along the left lateral aspect of the T6 vertebral body (series 12, image 13) measuring 1.3 x 1.1 cm. Disc levels: No evidence of high-grade spinal canal stenosis. IMPRESSION: 1. Minimal T2/STIR hyperintense signal within the anterior osteophytes at C4 with associated contrast enhancement. This is favored to either be degenerative or post-traumatic in nature. Correlate for history of trauma. If there is no history of trauma early osteomyelitis/discitis cannot be excluded and a short term follow up MRI is recommended in 3 weeks to assess for interval change. 2. No evidence of discitis/osteomyelitis in the thoracic spine. 3. Moderate to severe spinal canal narrowing at C5-C6 secondary to a combination of ligamentum flavum hypertrophy and a degenerative disc bulge. 4. T2 hyperintense lesion along the left lateral aspect of the T6 vertebral body measuring 1.3 x 1.1 cm. This is favored to represent a lymph node or a sympathetic chain ganglion. Recommend attention on follow up. Electronically Signed   By: Hemant  Desai M.D.   On: 06/07/2022 16:22   MR SHOULDER LEFT WO CONTRAST  Result Date: 06/07/2022 CLINICAL DATA:  Chronic shoulder pain. Rotator cuff disorder suspected. EXAM: MRI OF THE LEFT SHOULDER WITHOUT CONTRAST TECHNIQUE: Multiplanar, multisequence MR imaging of the shoulder was performed. No intravenous contrast was administered. COMPARISON:  None Available. FINDINGS: Rotator cuff: There is moderate intermediate T2 signal and thickening tendinosis of the anterior greater than mid AP dimension of the supraspinatus tendon footprint. There is fluid bright signal indicating a  partial-thickness tear of the mid to anterior supraspinatus tendon in a region measuring up to approximately 14 mm in AP dimension (sagittal series 6 images 4 and 5). Posteriorly, this tear is a midsubstance tear and anteriorly it extends through the bursal tendon surface where it appears to measure up to 6 mm in AP dimension. No significant tendon retraction. Punctate 2-3 mm midsubstance tear within the anterior infraspinatus tendon insertion. Mild partial-thickness articular sided tear of the superior subscapularis tendon insertion. The teres minor is intact. Muscles: No rotator cuff muscle atrophy, fatty infiltration, or edema. Biceps long head: There is mild-to-moderate intermediate T2 signal and thickening tendinosis of the proximal long head of the biceps tendon. Acromioclavicular Joint: There are mild degenerative changes of the acromioclavicular joint including joint space narrowing, subchondral marrow edema, and peripheral osteophytosis.   Type III acromion, with minimal downsloping of the anterolateral acromion. Mild fluid within the subacromial/subdeltoid bursa. Glenohumeral Joint: Moderate thinning of the glenohumeral cartilage. No joint effusion. Labrum: Grossly intact, but evaluation is limited by lack of intraarticular fluid. Bones:  No acute fracture. Other: There is mild-to-moderate fluid tracking along the fascial planes of the superficial posterior aspect of the infraspinatus and teres minor, deep to the posterior deltoid muscle. There is moderate edema throughout the adjacent posterior aspect of the deltoid musculature (coronal series 3 images 1 through 6) overlying this fluid, and there is scattered decreased T1 and decreased T2 signal within the deltoid muscle edema, possibly from evolving subacute to chronic blood products. IMPRESSION: 1. Moderate anterior greater than mid supraspinatus tendinosis. Partial-thickness tear of the mid to anterior supraspinatus tendon measuring up to 14 mm in AP  dimension. 2. Punctate 2-3 mm midsubstance tear within the anterior infraspinatus tendon insertion. 3. Mild partial-thickness articular sided tear of the superior subscapularis tendon insertion. 4. Mild-to-moderate proximal long head of the biceps tendinosis. 5. Mild degenerative changes of the acromioclavicular joint. 6. Overall there appears to be an interstitial tear of the posterior deltoid muscle with moderate edema and possible mild blood products interposed within the muscle fibers. There is mild to moderate fluid tracking between the posterior deltoid muscle and the infraspinatus and teres minor muscles. Electronically Signed   By: Ronald  Viola M.D.   On: 06/07/2022 14:33   DG Shoulder Left Port  Result Date: 06/06/2022 CLINICAL DATA:  Fever.  Pain. EXAM: LEFT SHOULDER COMPARISON:  None Available. FINDINGS: Single frontal view of the left shoulder. Mild acromioclavicular joint space narrowing and peripheral osteophytosis. Mild inferior glenohumeral joint space narrowing and peripheral osteophytosis. No acute fracture is seen. No dislocation. Status post median sternotomy. IMPRESSION: Mild acromioclavicular and glenohumeral osteoarthritis. Electronically Signed   By: Ronald  Viola M.D.   On: 06/06/2022 13:20    Cardiac Studies   Nuclear stress test 05/08/2022    Findings are consistent with prior infarction. The study is intermediate risk due to reduced systolic function.   No ST deviation was noted.   LV perfusion is abnormal. There is no evidence of ischemia. There is evidence of infarction. Defect 1: There is a medium defect with moderate reduction in uptake present in the apical to basal (inferior wall) location(s) that is fixed. There is abnormal wall motion in the defect area. Consistent with infarction and artifact.   Left ventricular function is abnormal. Global function is moderately reduced. There were multiple regional abnormalities. Nuclear stress EF: 38 %. The left ventricular  ejection fraction is moderately decreased (30-44%). End diastolic cavity size is moderately enlarged. End systolic cavity size is moderately enlarged.   Prior study available for comparison from 10/16/2020.  No significant changes.  Echocardiogram 05/28/2022   1. Left ventricular ejection fraction, by estimation, is 50%. The left  ventricle has mildly decreased function. Left ventricular endocardial  border not optimally defined to evaluate regional wall motion. There is  moderate left ventricular hypertrophy.  Left ventricular diastolic parameters are indeterminate. Elevated left  atrial pressure.   2. Right ventricular systolic function is normal. The right ventricular  size is normal. Tricuspid regurgitation signal is inadequate for assessing  PA pressure.   3. The mitral valve is abnormal. Mild mitral valve regurgitation. No  evidence of mitral stenosis.   4. The aortic valve is tricuspid. There is mild calcification of the  aortic valve. There is mild thickening of the aortic valve. Aortic valve    regurgitation is trivial. No aortic stenosis is present.   5. Aortic dilatation noted. There is mild dilatation of the aortic root,  measuring 42 mm. There is mild dilatation of the ascending aorta,  measuring 40 mm.   Comparison(s): Echocardiogram done 07/05/20 showed an EF of 40-45%.   Patient Profile     77 y.o. 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  Assessment & Plan    CAD: Symptoms are very atypical for angina.  Very recent nuclear perfusion study does not show evidence to suggest active ischemia.  Elevation in high-sensitivity troponin was marginal at [facility] and high-sensitivity troponin is completely normal here.  No plan for coronary angiography.  Symptoms are not likely to be cardiac.  On aspirin, beta-blocker, statin. CHF: With mildly depressed left ventricular ejection fraction.  Appears euvolemic and does not have symptoms of heart failure exacerbation. Staphylococcal bacteremia: Only positive blood cultures from 5/22, subsequent cultures from 524 are negative.  It is possible that he may need a TEE to exclude endocarditis.  He does not appear to have clear-cut evidence of infectious osteomyelitis or discitis of the spine, although he has a lot of pain in that area.     For questions or updates, please contact [facility] Please consult [email or web address] for contact info under        Signed, Guilianna Mckoy, MD  06/08/2022, 11:41 AM    

## 2022-06-08 NOTE — Progress Notes (Signed)
Pt brought down for MRI. Adamantly refusing at this time. Attempted to call RN, no answer. Pt sent back to room.

## 2022-06-09 DIAGNOSIS — B9561 Methicillin susceptible Staphylococcus aureus infection as the cause of diseases classified elsewhere: Secondary | ICD-10-CM | POA: Diagnosis not present

## 2022-06-09 DIAGNOSIS — R7881 Bacteremia: Secondary | ICD-10-CM | POA: Diagnosis not present

## 2022-06-09 LAB — CBC
HCT: 33.2 % — ABNORMAL LOW (ref 39.0–52.0)
Hemoglobin: 10.9 g/dL — ABNORMAL LOW (ref 13.0–17.0)
MCH: 26.7 pg (ref 26.0–34.0)
MCHC: 32.8 g/dL (ref 30.0–36.0)
MCV: 81.2 fL (ref 80.0–100.0)
Platelets: 166 10*3/uL (ref 150–400)
RBC: 4.09 MIL/uL — ABNORMAL LOW (ref 4.22–5.81)
RDW: 13.7 % (ref 11.5–15.5)
WBC: 7 10*3/uL (ref 4.0–10.5)
nRBC: 0 % (ref 0.0–0.2)

## 2022-06-09 LAB — GLUCOSE, CAPILLARY
Glucose-Capillary: 206 mg/dL — ABNORMAL HIGH (ref 70–99)
Glucose-Capillary: 170 mg/dL — ABNORMAL HIGH (ref 70–99)
Glucose-Capillary: 239 mg/dL — ABNORMAL HIGH (ref 70–99)
Glucose-Capillary: 335 mg/dL — ABNORMAL HIGH (ref 70–99)

## 2022-06-09 LAB — BASIC METABOLIC PANEL
Anion gap: 9 (ref 5–15)
BUN: 22 mg/dL (ref 8–23)
CO2: 25 mmol/L (ref 22–32)
Calcium: 8.1 mg/dL — ABNORMAL LOW (ref 8.9–10.3)
Chloride: 98 mmol/L (ref 98–111)
Creatinine, Ser: 0.75 mg/dL (ref 0.61–1.24)
GFR, Estimated: >60 mL/min (ref >60)
Glucose, Bld: 193 mg/dL — ABNORMAL HIGH (ref 70–99)
Potassium: 3.8 mmol/L (ref 3.5–5.1)
Sodium: 132 mmol/L — ABNORMAL LOW (ref 135–145)

## 2022-06-09 LAB — CULTURE, BLOOD (ROUTINE X 2)

## 2022-06-09 MED ORDER — LORAZEPAM 1 MG PO TABS
1.0000 mg | ORAL_TABLET | Freq: Once | ORAL | Status: AC
  Administered 2022-06-10: 1 mg via ORAL
  Filled 2022-06-09: qty 1

## 2022-06-09 MED ORDER — INSULIN GLARGINE-YFGN 100 UNIT/ML ~~LOC~~ SOLN
60.0000 [IU] | Freq: Two times a day (BID) | SUBCUTANEOUS | Status: DC
  Administered 2022-06-09 (×2): 60 [IU] via SUBCUTANEOUS
  Filled 2022-06-09 (×4): qty 0.6

## 2022-06-09 NOTE — Progress Notes (Signed)
PROGRESS NOTE    George [name]  ZOX:[id number] DOB: Sep 23, 1945 DOA: 06/05/2022 PCP: Kirstie Peri, MD   Brief Narrative:  George [name] is a 77 y.o. male with past medical history of AAA, chronic pain, CAD s/p CABG, HTN, HLD, DM, chronic systolic CHF, and prostate CA who presented with chest pain.  Recent stress test on 05/08/22 showed prior infarction and was intermediate risk with EF 30-44%.  He was admitted under cardiology with NSTEMI with plan for cardiac catheterization.  he spiked a fever to 102.  His only complaint is L shoulder pain that radiates into his L neck and jaw and into his L chest.  Hospitalist were consulted for that.  Eventually Ortho and ID were consulted.  Assessment & Plan:   Principal Problem:   MSSA bacteremia Active Problems:   HTN (hypertension)   Uncontrolled type 2 diabetes mellitus with hyperglycemia (HCC)   Mixed hyperlipidemia   NSTEMI (non-ST elevated myocardial infarction) (HCC)   Sepsis due to methicillin susceptible Staphylococcus aureus (MSSA) without acute organ dysfunction (HCC)   Acute pain of left shoulder  NSTEMI (non-ST elevated myocardial infarction) (HCC) ruled out -Patient with prior h/o CAD s/p CABG presenting with chest pain and uptrending troponin, initially thought to be consistent with NSTEMI.  She was started on heparin drip and plan was cardiac cath but then it was opined by cardiology that there is very low concern of ACS so heparin was discontinued and no plan for cardiac cath and cardiology signed off on 06/07/2022.   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  Complains of neck pain and left shoulder pain.  Ortho was consulted.  They did arthrocentesis of the left shoulder but it was a dry tap.  MRI spine shows some concern of possible C4 discitis but no infection in the left shoulder.  MRI left shoulder shows multiple torn ligaments.  Orthopedics to manage that.  May need repeat MRI of the  cervical spine.  He is on cefazolin and ID is managing.  Continue current pain management.   Mixed hyperlipidemia -Continue high-dose Lipitor   Uncontrolled type 2 diabetes mellitus with hyperglycemia (HCC) Appears to be taking 70 units of long-acting insulin at night however he has been on 50 units of Semglee twice daily and still he is hyperglycemic so I will increase further to 60 units twice daily and continue SSI.   HTN (hypertension) -Blood pressure very well-controlled.  Continue amlodipine and carvedilol -He was also on Toprol XL and Catapres at home but these are on hold currently.    Hypokalemia: Will replace.  DVT prophylaxis: enoxaparin (LOVENOX) injection 40 mg Start: 06/08/22 1000Heparin drip   Code Status: Full Code  Family Communication: granddaughter present at bedside.  Plan of care discussed with patient in length and he/she verbalized understanding and agreed with it.  Status is: Inpatient Remains inpatient appropriate because: Needs final plan by ID and orthopedics.  May need placement to SNF.  Consulting PT OT.   Estimated body mass index is 27.75 kg/m as calculated from the following:   Height as of this encounter: 6\' 4"  (1.93 m).   Weight as of this encounter: 103.4 kg.    Nutritional Assessment: Body mass index is 27.75 kg/m.Marland Kitchen Seen by dietician.  I agree with the assessment and plan as outlined below: Nutrition Status:        . Skin Assessment: I have examined the patient's skin and I agree with the wound assessment as performed by the  wound care RN as outlined below:    Consultants:  ID and orthopedics Cardiology-signed off  Procedures:  As above  Antimicrobials:  Anti-infectives (From admission, onward)    Start     Dose/Rate Route Frequency Ordered Stop   06/06/22 1430  ceFAZolin (ANCEF) IVPB 2g/100 mL premix        2 g 200 mL/hr over 30 Minutes Intravenous Every 8 hours 06/06/22 1339     06/06/22 1400  doxycycline (VIBRA-TABS)  tablet 100 mg  Status:  Discontinued        100 mg Oral Every 12 hours 06/06/22 1222 06/06/22 1449   06/06/22 1300  vancomycin (VANCOREADY) IVPB 1250 mg/250 mL  Status:  Discontinued        1,250 mg 166.7 mL/hr over 90 Minutes Intravenous Every 12 hours 06/05/22 2247 06/06/22 1339   06/06/22 0600  piperacillin-tazobactam (ZOSYN) IVPB 3.375 g  Status:  Discontinued        3.375 g 12.5 mL/hr over 240 Minutes Intravenous Every 8 hours 06/05/22 2247 06/06/22 1339   06/05/22 2345  vancomycin (VANCOREADY) IVPB 2000 mg/400 mL        2,000 mg 200 mL/hr over 120 Minutes Intravenous  Once 06/05/22 2247 06/06/22 0820   06/05/22 2345  piperacillin-tazobactam (ZOSYN) IVPB 3.375 g        3.375 g 100 mL/hr over 30 Minutes Intravenous  Once 06/05/22 2247 06/06/22 1004         Subjective: Seen and examined.  No new complaint other than left shoulder and neck pain.  Fully alert and oriented.  Wife and multiple other family members at bedside.  Objective: Vitals:   06/09/22 0402 06/09/22 0404 06/09/22 0716 06/09/22 1042  BP:  (!) 148/68 133/63 (!) 145/72  Pulse:  85 90 80  Resp:  18 16 17   Temp:  98.2 F (36.8 C) 98.1 F (36.7 C) 98.2 F (36.8 C)  TempSrc: Oral Oral Oral Oral  SpO2:  92% 92% 90%  Weight:  103.4 kg    Height:        Intake/Output Summary (Last 24 hours) at 06/09/2022 1111 Last data filed at 06/09/2022 0800 Gross per 24 hour  Intake 273 ml  Output --  Net 273 ml    Filed Weights   06/07/22 0407 06/08/22 0423 06/09/22 0404  Weight: 104.3 kg 103.8 kg 103.4 kg    Examination:  General exam: Appears calm and comfortable  Respiratory system: Clear to auscultation. Respiratory effort normal. Cardiovascular system: S1 & S2 heard, RRR. No JVD, murmurs, rubs, gallops or clicks. No pedal edema. Gastrointestinal system: Abdomen is nondistended, soft and nontender. No organomegaly or masses felt. Normal bowel sounds heard. Central nervous system: Alert and oriented. No focal  neurological deficits. Extremities: Symmetric 5 x 5 power. Skin: No rashes, lesions or ulcers.  Psychiatry: Judgement and insight appear normal. Mood & affect appropriate.    Data Reviewed: I have personally reviewed following labs and imaging studies  CBC: Recent Labs  Lab 06/05/22 2226 06/06/22 0643 06/07/22 0718 06/08/22 0042 06/09/22 0101  WBC 13.0* 12.2* 9.0 6.6 7.0  NEUTROABS 11.3*  --   --   --   --   HGB 12.9* 12.6* 11.9* 11.2* 10.9*  HCT 38.7* 37.2* 36.1* 33.3* 33.2*  MCV 81.0 79.7* 81.5 81.8 81.2  PLT 166 161 161 158 166    Basic Metabolic Panel: Recent Labs  Lab 06/05/22 2226 06/06/22 0643 06/07/22 0659 06/08/22 0042 06/09/22 0101  NA 131* 134* 130* 131* 132*  K 4.0 3.6 3.6 3.3* 3.8  CL 94* 97* 95* 96* 98  CO2 22 26 23 26 25   GLUCOSE 398* 310* 331* 263* 193*  BUN 17 21 24* 28* 22  CREATININE 1.05 1.02 0.95 1.05 0.75  CALCIUM 8.8* 8.8* 8.6* 8.3* 8.1*    GFR: Estimated Creatinine Clearance: 94.9 mL/min (by C-G formula based on SCr of 0.75 mg/dL). Liver Function Tests: No results for input(s): "AST", "ALT", "ALKPHOS", "BILITOT", "PROT", "ALBUMIN" in the last 168 hours. No results for input(s): "LIPASE", "AMYLASE" in the last 168 hours. No results for input(s): "AMMONIA" in the last 168 hours. Coagulation Profile: No results for input(s): "INR", "PROTIME" in the last 168 hours. Cardiac Enzymes: No results for input(s): "CKTOTAL", "CKMB", "CKMBINDEX", "TROPONINI" in the last 168 hours. BNP (last 3 results) No results for input(s): "PROBNP" in the last 8760 hours. HbA1C: No results for input(s): "HGBA1C" in the last 72 hours.  CBG: Recent Labs  Lab 06/08/22 0617 06/08/22 1115 06/08/22 1644 06/08/22 2029 06/09/22 0621  GLUCAP 292* 326* 284* 224* 206*    Lipid Profile: No results for input(s): "CHOL", "HDL", "LDLCALC", "TRIG", "CHOLHDL", "LDLDIRECT" in the last 72 hours.  Thyroid Function Tests: No results for input(s): "TSH", "T4TOTAL",  "FREET4", "T3FREE", "THYROIDAB" in the last 72 hours. Anemia Panel: No results for input(s): "VITAMINB12", "FOLATE", "FERRITIN", "TIBC", "IRON", "RETICCTPCT" in the last 72 hours. Sepsis Labs: No results for input(s): "PROCALCITON", "LATICACIDVEN" in the last 168 hours.  Recent Results (from the past 240 hour(s))  Culture, blood (Routine X 2) w Reflex to ID Panel     Status: Abnormal   Collection Time: 06/05/22 10:26 PM   Specimen: BLOOD LEFT HAND  Result Value Ref Range Status   Specimen Description BLOOD LEFT HAND  Final   Special Requests NONE  Final   Culture  Setup Time   Final    GRAM POSITIVE COCCI IN CLUSTERS IN BOTH AEROBIC AND ANAEROBIC BOTTLES CRITICAL VALUE NOTED.  VALUE IS CONSISTENT WITH PREVIOUSLY REPORTED AND CALLED VALUE.    Culture (A)  Final    STAPHYLOCOCCUS AUREUS SUSCEPTIBILITIES PERFORMED ON PREVIOUS CULTURE WITHIN THE LAST 5 DAYS. Performed at [facility], [address], [place], [place] [address]    Report Status 06/08/2022 FINAL  Final  Culture, blood (Routine X 2) w Reflex to ID Panel     Status: Abnormal   Collection Time: 06/05/22 10:26 PM   Specimen: BLOOD RIGHT HAND  Result Value Ref Range Status   Specimen Description BLOOD RIGHT HAND  Final   Special Requests   Final    BOTTLES DRAWN AEROBIC AND ANAEROBIC Blood Culture adequate volume   Culture  Setup Time   Final    GRAM POSITIVE COCCI IN CLUSTERS IN BOTH AEROBIC AND ANAEROBIC BOTTLES CRITICAL RESULT CALLED TO, READ BACK BY AND VERIFIED WITH: PHARMD A PAYTES [id number] AT 1325 BY CM Performed at [facility], [address], [place], [place] [address]    Culture STAPHYLOCOCCUS AUREUS (A)  Final   Report Status 06/08/2022 FINAL  Final   Organism ID, Bacteria STAPHYLOCOCCUS AUREUS  Final      Susceptibility   Staphylococcus aureus - MIC*    CIPROFLOXACIN <=0.5 SENSITIVE Sensitive     ERYTHROMYCIN <=0.25 SENSITIVE Sensitive     GENTAMICIN <=0.5 SENSITIVE Sensitive     OXACILLIN 0.5  SENSITIVE Sensitive     TETRACYCLINE <=1 SENSITIVE Sensitive     VANCOMYCIN <=0.5 SENSITIVE Sensitive     TRIMETH/SULFA <=10  SENSITIVE Sensitive     CLINDAMYCIN <=0.25 SENSITIVE Sensitive     RIFAMPIN <=0.5 SENSITIVE Sensitive     Inducible Clindamycin NEGATIVE Sensitive     LINEZOLID 2 SENSITIVE Sensitive     * STAPHYLOCOCCUS AUREUS  Blood Culture ID Panel (Reflexed)     Status: Abnormal   Collection Time: 06/05/22 10:26 PM  Result Value Ref Range Status   Enterococcus faecalis NOT DETECTED NOT DETECTED Final   Enterococcus Faecium NOT DETECTED NOT DETECTED Final   Listeria monocytogenes NOT DETECTED NOT DETECTED Final   Staphylococcus species DETECTED (A) NOT DETECTED Final    Comment: CRITICAL RESULT CALLED TO, READ BACK BY AND VERIFIED WITH: PHARMD A PAYTES [id number] AT 1325 BY CM    Staphylococcus aureus (BCID) DETECTED (A) NOT DETECTED Final    Comment: CRITICAL RESULT CALLED TO, READ BACK BY AND VERIFIED WITH: PHARMD A PAYTES [id number] AT 1325 BY CM    Staphylococcus epidermidis NOT DETECTED NOT DETECTED Final   Staphylococcus lugdunensis NOT DETECTED NOT DETECTED Final   Streptococcus species NOT DETECTED NOT DETECTED Final   Streptococcus agalactiae NOT DETECTED NOT DETECTED Final   Streptococcus pneumoniae NOT DETECTED NOT DETECTED Final   Streptococcus pyogenes NOT DETECTED NOT DETECTED Final   A.calcoaceticus-baumannii NOT DETECTED NOT DETECTED Final   Bacteroides fragilis NOT DETECTED NOT DETECTED Final   Enterobacterales NOT DETECTED NOT DETECTED Final   Enterobacter cloacae complex NOT DETECTED NOT DETECTED Final   Escherichia coli NOT DETECTED NOT DETECTED Final   Klebsiella aerogenes NOT DETECTED NOT DETECTED Final   Klebsiella oxytoca NOT DETECTED NOT DETECTED Final   Klebsiella pneumoniae NOT DETECTED NOT DETECTED Final   Proteus species NOT DETECTED NOT DETECTED Final   Salmonella species NOT DETECTED NOT DETECTED Final   Serratia marcescens NOT DETECTED NOT  DETECTED Final   Haemophilus influenzae NOT DETECTED NOT DETECTED Final   Neisseria meningitidis NOT DETECTED NOT DETECTED Final   Pseudomonas aeruginosa NOT DETECTED NOT DETECTED Final   Stenotrophomonas maltophilia NOT DETECTED NOT DETECTED Final   Candida albicans NOT DETECTED NOT DETECTED Final   Candida auris NOT DETECTED NOT DETECTED Final   Candida glabrata NOT DETECTED NOT DETECTED Final   Candida krusei NOT DETECTED NOT DETECTED Final   Candida parapsilosis NOT DETECTED NOT DETECTED Final   Candida tropicalis NOT DETECTED NOT DETECTED Final   Cryptococcus neoformans/gattii NOT DETECTED NOT DETECTED Final   Meth resistant mecA/C and MREJ NOT DETECTED NOT DETECTED Final    Comment: Performed at [facility], [address], [place], [place] [address]  Culture, blood (Routine X 2) w Reflex to ID Panel     Status: None (Preliminary result)   Collection Time: 06/07/22  6:58 AM   Specimen: BLOOD RIGHT HAND  Result Value Ref Range Status   Specimen Description BLOOD RIGHT HAND  Final   Special Requests   Final    BOTTLES DRAWN AEROBIC AND ANAEROBIC Blood Culture adequate volume   Culture   Final    NO GROWTH 2 DAYS Performed at [facility], [address] [place], [place] [address]    Report Status PENDING  Incomplete  Culture, blood (Routine X 2) w Reflex to ID Panel     Status: None (Preliminary result)   Collection Time: 06/07/22  7:11 AM   Specimen: BLOOD LEFT HAND  Result Value Ref Range Status   Specimen Description BLOOD LEFT HAND  Final   Special Requests   Final    BOTTLES DRAWN  AEROBIC AND ANAEROBIC Blood Culture adequate volume   Culture   Final    NO GROWTH 2 DAYS Performed at [facility], [address] [place], [place] [address]    Report Status PENDING  Incomplete     Radiology Studies: MR CERVICAL SPINE W WO CONTRAST  Result Date: 06/07/2022 CLINICAL DATA:  Neck pain, infection suspected, no prior imaging concern for vertebral  infection with MSSA bacteremia and severe new neck pain x 1 week; Mid-back pain, infection suspected, no prior imaging concern for vertebral infection with MSSA bacteremia and severe new neck pain x 1 week EXAM: MRI CERVICAL AND THORACIC SPINE WITHOUT AND WITH CONTRAST TECHNIQUE: Multiplanar and multiecho pulse sequences of the cervical spine, to include the craniocervical junction and cervicothoracic junction, and the thoracic spine, were obtained without and with intravenous contrast. CONTRAST:  10mL GADAVIST GADOBUTROL 1 MMOL/ML IV SOLN COMPARISON:  None Available. FINDINGS: MRI CERVICAL SPINE FINDINGS Limitations: Assessment is slightly limited due to motion artifact. Alignment: There is straightening of the normal cervical lordosis. Vertebrae: There is minimal T2/stir hyperintense signal within the anterior osteophytes at C4 (series 5, image 7). There is also very contrast enhancement associated with this region (series 19, image 6-8). There is no abnormal signal in the intervertebral discs. Cord: Normal signal and morphology. Posterior Fossa, vertebral arteries, paraspinal tissues: There is nonspecific. Vertebral soft tissue edema spanning the C3-C5 vertebral bodies (series 5, image 9). There is also mild contrast enhancement in the region (series 19, image 9). Disc levels: There is moderate to severe spinal canal narrowing at C5-C6 secondary to a combination ligamentum flavum hypertrophy and a degenerative disc bulge MRI THORACIC SPINE FINDINGS Alignment:  Physiologic. Vertebrae: No fracture, evidence of discitis, or bone lesion. There is an osseous hemangioma at T8 Cord:  Normal signal and morphology. Paraspinal and other soft tissues: There is a T2 hyperintense lesion along the left lateral aspect of the T6 vertebral body (series 12, image 13) measuring 1.3 x 1.1 cm. Disc levels: No evidence of high-grade spinal canal stenosis. IMPRESSION: 1. Minimal T2/STIR hyperintense signal within the anterior  osteophytes at C4 with associated contrast enhancement. This is favored to either be degenerative or post-traumatic in nature. Correlate for history of trauma. If there is no history of trauma early osteomyelitis/discitis cannot be excluded and a short term follow up MRI is recommended in 3 weeks to assess for interval change. 2. No evidence of discitis/osteomyelitis in the thoracic spine. 3. Moderate to severe spinal canal narrowing at C5-C6 secondary to a combination of ligamentum flavum hypertrophy and a degenerative disc bulge. 4. T2 hyperintense lesion along the left lateral aspect of the T6 vertebral body measuring 1.3 x 1.1 cm. This is favored to represent a lymph node or a sympathetic chain ganglion. Recommend attention on follow up. Electronically Signed   By: Lorenza Cambridge M.D.   On: 06/07/2022 16:22   MR THORACIC SPINE W WO CONTRAST  Result Date: 06/07/2022 CLINICAL DATA:  Neck pain, infection suspected, no prior imaging concern for vertebral infection with MSSA bacteremia and severe new neck pain x 1 week; Mid-back pain, infection suspected, no prior imaging concern for vertebral infection with MSSA bacteremia and severe new neck pain x 1 week EXAM: MRI CERVICAL AND THORACIC SPINE WITHOUT AND WITH CONTRAST TECHNIQUE: Multiplanar and multiecho pulse sequences of the cervical spine, to include the craniocervical junction and cervicothoracic junction, and the thoracic spine, were obtained without and with intravenous contrast. CONTRAST:  10mL GADAVIST GADOBUTROL 1  MMOL/ML IV SOLN COMPARISON:  None Available. FINDINGS: MRI CERVICAL SPINE FINDINGS Limitations: Assessment is slightly limited due to motion artifact. Alignment: There is straightening of the normal cervical lordosis. Vertebrae: There is minimal T2/stir hyperintense signal within the anterior osteophytes at C4 (series 5, image 7). There is also very contrast enhancement associated with this region (series 19, image 6-8). There is no abnormal  signal in the intervertebral discs. Cord: Normal signal and morphology. Posterior Fossa, vertebral arteries, paraspinal tissues: There is nonspecific. Vertebral soft tissue edema spanning the C3-C5 vertebral bodies (series 5, image 9). There is also mild contrast enhancement in the region (series 19, image 9). Disc levels: There is moderate to severe spinal canal narrowing at C5-C6 secondary to a combination ligamentum flavum hypertrophy and a degenerative disc bulge MRI THORACIC SPINE FINDINGS Alignment:  Physiologic. Vertebrae: No fracture, evidence of discitis, or bone lesion. There is an osseous hemangioma at T8 Cord:  Normal signal and morphology. Paraspinal and other soft tissues: There is a T2 hyperintense lesion along the left lateral aspect of the T6 vertebral body (series 12, image 13) measuring 1.3 x 1.1 cm. Disc levels: No evidence of high-grade spinal canal stenosis. IMPRESSION: 1. Minimal T2/STIR hyperintense signal within the anterior osteophytes at C4 with associated contrast enhancement. This is favored to either be degenerative or post-traumatic in nature. Correlate for history of trauma. If there is no history of trauma early osteomyelitis/discitis cannot be excluded and a short term follow up MRI is recommended in 3 weeks to assess for interval change. 2. No evidence of discitis/osteomyelitis in the thoracic spine. 3. Moderate to severe spinal canal narrowing at C5-C6 secondary to a combination of ligamentum flavum hypertrophy and a degenerative disc bulge. 4. T2 hyperintense lesion along the left lateral aspect of the T6 vertebral body measuring 1.3 x 1.1 cm. This is favored to represent a lymph node or a sympathetic chain ganglion. Recommend attention on follow up. Electronically Signed   By: Lorenza Cambridge M.D.   On: 06/07/2022 16:22   MR SHOULDER LEFT WO CONTRAST  Result Date: 06/07/2022 CLINICAL DATA:  Chronic shoulder pain. Rotator cuff disorder suspected. EXAM: MRI OF THE LEFT SHOULDER  WITHOUT CONTRAST TECHNIQUE: Multiplanar, multisequence MR imaging of the shoulder was performed. No intravenous contrast was administered. COMPARISON:  None Available. FINDINGS: Rotator cuff: There is moderate intermediate T2 signal and thickening tendinosis of the anterior greater than mid AP dimension of the supraspinatus tendon footprint. There is fluid bright signal indicating a partial-thickness tear of the mid to anterior supraspinatus tendon in a region measuring up to approximately 14 mm in AP dimension (sagittal series 6 images 4 and 5). Posteriorly, this tear is a midsubstance tear and anteriorly it extends through the bursal tendon surface where it appears to measure up to 6 mm in AP dimension. No significant tendon retraction. Punctate 2-3 mm midsubstance tear within the anterior infraspinatus tendon insertion. Mild partial-thickness articular sided tear of the superior subscapularis tendon insertion. The teres minor is intact. Muscles: No rotator cuff muscle atrophy, fatty infiltration, or edema. Biceps long head: There is mild-to-moderate intermediate T2 signal and thickening tendinosis of the proximal long head of the biceps tendon. Acromioclavicular Joint: There are mild degenerative changes of the acromioclavicular joint including joint space narrowing, subchondral marrow edema, and peripheral osteophytosis. Type III acromion, with minimal downsloping of the anterolateral acromion. Mild fluid within the subacromial/subdeltoid bursa. Glenohumeral Joint: Moderate thinning of the glenohumeral cartilage. No joint effusion. Labrum: Grossly intact, but evaluation is limited  by lack of intraarticular fluid. Bones:  No acute fracture. Other: There is mild-to-moderate fluid tracking along the fascial planes of the superficial posterior aspect of the infraspinatus and teres minor, deep to the posterior deltoid muscle. There is moderate edema throughout the adjacent posterior aspect of the deltoid musculature  (coronal series 3 images 1 through 6) overlying this fluid, and there is scattered decreased T1 and decreased T2 signal within the deltoid muscle edema, possibly from evolving subacute to chronic blood products. IMPRESSION: 1. Moderate anterior greater than mid supraspinatus tendinosis. Partial-thickness tear of the mid to anterior supraspinatus tendon measuring up to 14 mm in AP dimension. 2. Punctate 2-3 mm midsubstance tear within the anterior infraspinatus tendon insertion. 3. Mild partial-thickness articular sided tear of the superior subscapularis tendon insertion. 4. Mild-to-moderate proximal long head of the biceps tendinosis. 5. Mild degenerative changes of the acromioclavicular joint. 6. Overall there appears to be an interstitial tear of the posterior deltoid muscle with moderate edema and possible mild blood products interposed within the muscle fibers. There is mild to moderate fluid tracking between the posterior deltoid muscle and the infraspinatus and teres minor muscles. Electronically Signed   By: Neita Garnet M.D.   On: 06/07/2022 14:33    Scheduled Meds:  amLODipine  10 mg Oral Daily   aspirin EC  81 mg Oral Daily   atorvastatin  80 mg Oral Daily   carvedilol  12.5 mg Oral BID   docusate sodium  100 mg Oral BID   enoxaparin (LOVENOX) injection  40 mg Subcutaneous Q24H   insulin aspart  0-20 Units Subcutaneous TID WC   insulin aspart  0-5 Units Subcutaneous QHS   insulin aspart  3 Units Subcutaneous TID WC   insulin glargine-yfgn  60 Units Subcutaneous BID   melatonin  3 mg Oral QHS   polyethylene glycol  17 g Oral Daily   sodium chloride flush  3 mL Intravenous Q12H   Continuous Infusions:  sodium chloride     sodium chloride 1 mL/kg/hr (06/06/22 0420)    ceFAZolin (ANCEF) IV 2 g (06/09/22 0535)     LOS: 4 days   Hughie Closs, MD [facility]  06/09/2022, 11:11 AM   *Please note that this is a verbal dictation therefore any spelling or grammatical errors are  due to the "[employer]agon [employer] One" system interpretation.  Please page via Amion and do not message via secure chat for urgent patient care matters. Secure chat can be used for non urgent patient care matters.  How to contact the [facility] Attending or Consulting provider 7A - 7P or covering provider during after hours 7P -7A, for this patient?  Check the care team in [facility] and look for a) attending/consulting [facility] provider listed and b) the [facility] team listed. Page or secure chat 7A-7P. Log into [email or web address] and use [place]'s universal password to access. If you do not have the password, please contact the hospital operator. Locate the [facility] [facility] provider you are looking for under [facility] and page to a number that you can be directly reached. If you still have difficulty reaching the provider, please page the [facility] (Director on Call) for the Hospitalists listed on amion for assistance.

## 2022-06-09 NOTE — Evaluation (Signed)
Physical Therapy Evaluation Patient Details Name: QUAVON WACEK MRN: [id number] DOB: Jun 14, 1945 Today's Date: 06/09/2022  History of Present Illness  Patient is 77 y.o. male who presented with chest pain. PMH significant of AAA, chronic pain, CAD s/p CABG, HTN, HLD, DM, chronic systolic CHF, and prostate CA. Recent stress test on 05/08/22 showed prior infarction and was intermediate risk with EF 30-44%.  He was admitted under cardiology with NSTEMI with plan for cardiac catheterization.  he spiked a fever to 102.  His only complaint is L shoulder pain that radiates into his L neck and jaw and into his L chest.  Hospitalist were consulted for that.  Eventually Ortho and ID were consulted. There is straightening of the normal cervical lordosis, anterior cervical osteophytes C4;ST consulted d/t c/o swallowing difficulty by pt/family.   Clinical Impression  DRAZEN ECKERT is 77 y.o. male admitted with above HPI and diagnosis. Patient is currently limited by functional impairments below (see PT problem list). Patient lives with spouse and is independent at baseline. Currently he is limited by mild cognitive deficits and greatly by Lt shoulder pain. Pt required mod assist for supine>sit and min guard with cues to sequence return to supine. He completed sit<>stand with min assist, HHA to steady and took small steps EOB but declined greater ambulation. Patient will benefit from continued skilled PT interventions to address impairments and progress independence with mobility. Acute PT will follow and progress as able.        Recommendations for follow up therapy are one component of a multi-disciplinary discharge planning process, led by the attending physician.  Recommendations may be updated based on patient status, additional functional criteria and insurance authorization.  Follow Up Recommendations       Assistance Recommended at Discharge Frequent or constant Supervision/Assistance  Patient can return  home with the following  A little help with walking and/or transfers;A little help with bathing/dressing/bathroom;Assistance with cooking/housework;Direct supervision/assist for medications management;Assist for transportation;Help with stairs or ramp for entrance    Equipment Recommendations None recommended by PT  Recommendations for Other Services       Functional Status Assessment Patient has had a recent decline in their functional status and demonstrates the ability to make significant improvements in function in a reasonable and predictable amount of time.     Precautions / Restrictions Precautions Precautions: Fall Restrictions Weight Bearing Restrictions: No      Mobility  Bed Mobility Overal bed mobility: Needs Assistance Bed Mobility: Supine to Sit, Sit to Supine     Supine to sit: Mod assist, HOB elevated Sit to supine: Min guard, HOB elevated   General bed mobility comments: cues for Rt UE use on bed rail    Transfers Overall transfer level: Needs assistance Equipment used: 1 person hand held assist Transfers: Sit to/from Stand Sit to Stand: Min assist, From elevated surface           General transfer comment: 1HHA on Rt side, EOB slightly elevated for pt height. min assist to initiate power up and steady from EOB. pt took small lateral step to move to [facility] prior to return to sit.    Ambulation/Gait                  Stairs            Wheelchair Mobility    Modified Rankin (Stroke Patients Only)       Balance Overall balance assessment: Needs assistance Sitting-balance support: Feet supported, Single extremity supported  Sitting balance-Leahy Scale: Fair     Standing balance support: Single extremity supported, Reliant on assistive device for balance Standing balance-Leahy Scale: Fair Standing balance comment: reliant on Rt UE support                             Pertinent Vitals/Pain Pain Assessment Pain Assessment:  Faces Faces Pain Scale: Hurts even more Pain Location: Lt shoulder, anteriorly Pain Descriptors / Indicators: Aching, Discomfort, Grimacing, Guarding Pain Intervention(s): Limited activity within patient's tolerance, Monitored during session, Repositioned    Home Living Family/patient expects to be discharged to:: Private residence Living Arrangements: Spouse/significant other Available Help at Discharge: Family Type of Home: House Home Access: Stairs to enter Entrance Stairs-Rails:  (columns to hold) Entrance Stairs-Number of Steps: 2   Home Layout: One level Home Equipment: Shower seat;Grab bars - tub/shower;Toilet riser Additional Comments: fully independent PTA, works as a Soil scientist, 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    Prior Function Prior Level of Function : Independent/Modified Independent                     Hand Dominance   Dominant Hand: Right    Extremity/Trunk Assessment   Upper Extremity Assessment Upper Extremity Assessment: Defer to OT evaluation;LUE deficits/detail;RUE deficits/detail RUE Deficits / Details: WFLs LUE Deficits / Details: limited due to pain    Lower Extremity Assessment Lower Extremity Assessment: Overall WFL for tasks assessed    Cervical / Trunk Assessment Cervical / Trunk Assessment: Normal (somewhat flat back)  Communication   Communication: No difficulties  Cognition Arousal/Alertness: Awake/alert Behavior During Therapy: WFL for tasks assessed/performed Overall Cognitive Status: Impaired/Different from baseline Area of Impairment: Orientation, Problem solving, Awareness, Attention                 Orientation Level: Time Current Attention Level: Selective       Awareness: Emergent Problem Solving: Difficulty sequencing, Requires verbal cues General Comments: pt oriented to situation, self, place. requires orientation to date, cues for holiday tomorrow. pt focused on returning to  bed to rest throughout all mobility.        General Comments      Exercises     Assessment/Plan    PT Assessment Patient needs continued PT services  PT Problem List Decreased strength;Decreased range of motion;Decreased activity tolerance;Decreased balance;Decreased mobility;Decreased cognition;Decreased knowledge of use of DME;Decreased safety awareness;Decreased knowledge of precautions;Cardiopulmonary status limiting activity;Pain       PT Treatment Interventions DME instruction;Gait training;Stair training;Functional mobility training;Therapeutic activities;Therapeutic exercise;Balance training;Neuromuscular re-education;Cognitive remediation;Patient/family education    PT Goals (Current goals can be found in the Care Plan section)  Acute Rehab PT Goals Patient Stated Goal: shoulder to stop hurting PT Goal Formulation: With patient/family Time For Goal Achievement: 06/23/22 Potential to Achieve Goals: Good    Frequency Min 3X/week     Co-evaluation               AM-PAC PT "6 Clicks" Mobility  Outcome Measure Help needed turning from your back to your side while in a flat bed without using bedrails?: A Little Help needed moving from lying on your back to sitting on the side of a flat bed without using bedrails?: A Lot Help needed moving to and from a bed to a chair (including a wheelchair)?: A Little Help needed standing up from a chair using your arms (e.g., wheelchair or bedside chair)?: A Little Help  needed to walk in hospital room?: A Lot Help needed climbing 3-5 steps with a railing? : Total 6 Click Score: 14    End of Session Equipment Utilized During Treatment: Gait belt Activity Tolerance: Patient tolerated treatment well;Patient limited by pain (Lt shoulder) Patient left: in bed;with call bell/phone within reach;with bed alarm set;with family/visitor present Nurse Communication: Mobility status PT Visit Diagnosis: Muscle weakness (generalized)  (M62.81);Other abnormalities of gait and mobility (R26.89);Difficulty in walking, not elsewhere classified (R26.2);Pain Pain - Right/Left: Left Pain - part of body: Shoulder    Time: 1610-9604 PT Time Calculation (min) (ACUTE ONLY): 29 min   Charges:   PT Evaluation $PT Eval Moderate Complexity: 1 Mod PT Treatments $Therapeutic Activity: 8-22 mins       Wynn Maudlin, DPT Acute Rehabilitation Services Office [phone]  06/09/22 11:46 AM

## 2022-06-09 NOTE — Progress Notes (Signed)
Orthopedic Tech Progress Note Patient Details:  George [name] 09-09-1945 [id number]  Ortho Devices Type of Ortho Device: Arm sling Ortho Device/Splint Location: For LUE when ambulating, at bedside Ortho Device/Splint Interventions: Ordered   Post Interventions Instructions Provided: Care of device, Adjustment of device  Docia Furl 06/09/2022, 4:27 PM

## 2022-06-09 NOTE — Evaluation (Signed)
Occupational Therapy Evaluation Patient Details Name: George [name] MRN: [id number] DOB: 14-Jan-1946 Today's Date: 06/09/2022   History of Present Illness Patient is 77 y.o. male who presented with chest pain. PMH significant of AAA, chronic pain, CAD s/p CABG, HTN, HLD, DM, chronic systolic CHF, and prostate CA. Recent stress test on 05/08/22 showed prior infarction and was intermediate risk with EF 30-44%.  He was admitted under cardiology with NSTEMI with plan for cardiac catheterization.  he spiked a fever to 102.  His only complaint is L shoulder pain that radiates into his L neck and jaw and into his L chest.  Hospitalist were consulted for that.  Eventually Ortho and ID were consulted. There is straightening of the normal cervical lordosis, anterior cervical osteophytes C4;ST consulted d/t c/o swallowing difficulty by pt/family.   Clinical Impression   Pt is typically active and independent. Presents with significant L shoulder pain, impaired cognition (which continues to improve per family), generalized weakness and decreased standing balance. Pt fatigues easily. He requires up to mod assist for bed mobility and min assist to stand from elevate surface. He requires set up to max assist for ADLs. Ordered sling for L UE for use when mobilizing, pt is agreeable to try use. Recommending HHOT upon discharge.      Recommendations for follow up therapy are one component of a multi-disciplinary discharge planning process, led by the attending physician.  Recommendations may be updated based on patient status, additional functional criteria and insurance authorization.   Assistance Recommended at Discharge Frequent or constant Supervision/Assistance  Patient can return home with the following A little help with walking and/or transfers;A lot of help with bathing/dressing/bathroom;Assistance with cooking/housework;Direct supervision/assist for medications management;Direct supervision/assist for financial  management;Assist for transportation;Help with stairs or ramp for entrance    Functional Status Assessment  Patient has had a recent decline in their functional status and demonstrates the ability to make significant improvements in function in a reasonable and predictable amount of time.  Equipment Recommendations       Recommendations for Other Services       Precautions / Restrictions Precautions Precautions: Fall Required Braces or Orthoses: Sling (for comfort when OOB) Restrictions Weight Bearing Restrictions: No      Mobility Bed Mobility Overal bed mobility: Needs Assistance Bed Mobility: Supine to Sit, Sit to Supine     Supine to sit: Mod assist, HOB elevated Sit to supine: Min assist, HOB elevated   General bed mobility comments: cues to self assist, physical assist to raise trunk, hips to EOB with bed pad and for LEs back into bed    Transfers Overall transfer level: Needs assistance Equipment used: 1 person hand held assist Transfers: Sit to/from Stand Sit to Stand: Min assist, From elevated surface           General transfer comment: assist to rise and steady with R hand held      Balance Overall balance assessment: Needs assistance   Sitting balance-Leahy Scale: Fair     Standing balance support: Single extremity supported, Reliant on assistive device for balance Standing balance-Leahy Scale: Poor Standing balance comment: reliant on Rt UE support                           ADL either performed or assessed with clinical judgement   ADL Overall ADL's : Needs assistance/impaired Eating/Feeding: Set up;Bed level   Grooming: Wash/dry hands;Wash/dry face;Sitting;Supervision/safety   Upper Body Bathing: Moderate assistance;Sitting  Lower Body Bathing: Sit to/from stand;Maximal assistance   Upper Body Dressing : Moderate assistance;Sitting   Lower Body Dressing: Maximal assistance;Sit to/from stand                        Vision Ability to See in Adequate Light: 0 Adequate Patient Visual Report: No change from baseline       Perception     Praxis      Pertinent Vitals/Pain Pain Assessment Pain Assessment: Faces Faces Pain Scale: Hurts even more Pain Location: Lt shoulder Pain Descriptors / Indicators: Aching, Discomfort, Grimacing, Guarding Pain Intervention(s): Repositioned, Ice applied, Monitored during session     Hand Dominance Right   Extremity/Trunk Assessment Upper Extremity Assessment Upper Extremity Assessment: LUE deficits/detail LUE Deficits / Details: full AROM elbow to hand, shoulder FF limited to 45 degrees before pain LUE: Unable to fully assess due to pain LUE Coordination: decreased gross motor   Lower Extremity Assessment Lower Extremity Assessment: Defer to PT evaluation   Cervical / Trunk Assessment Cervical / Trunk Assessment: Normal   Communication Communication Communication: HOH   Cognition Arousal/Alertness: Awake/alert Behavior During Therapy: WFL for tasks assessed/performed Overall Cognitive Status: Impaired/Different from baseline Area of Impairment: Orientation, Problem solving, Awareness, Attention                 Orientation Level: Time Current Attention Level: Selective       Awareness: Emergent Problem Solving: Difficulty sequencing, Requires verbal cues General Comments: pt highly focused on L shoulder pain and need for rest, educated in benefits of OOB activity     General Comments       Exercises     Shoulder Instructions      Home Living Family/patient expects to be discharged to:: Private residence Living Arrangements: Spouse/significant other Available Help at Discharge: Family Type of Home: House Home Access: Stairs to enter Secretary/administrator of Steps: 2   Home Layout: One level     Bathroom Shower/Tub: Chief Strategy Officer: Standard     Home Equipment: Shower seat;Grab bars -  tub/shower;Toilet riser   Additional Comments: fully independent PTA, works as a Soil scientist, 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      Prior Functioning/Environment Prior Level of Function : Independent/Modified Independent;Driving                        OT Problem List: Decreased strength;Decreased coordination;Decreased cognition;Pain;Impaired UE functional use;Decreased range of motion;Impaired balance (sitting and/or standing)      OT Treatment/Interventions: Self-care/ADL training;DME and/or AE instruction;Therapeutic activities;Cognitive remediation/compensation;Patient/family education;Balance training;Therapeutic exercise    OT Goals(Current goals can be found in the care plan section) Acute Rehab OT Goals OT Goal Formulation: With patient Time For Goal Achievement: 06/23/22 Potential to Achieve Goals: Good ADL Goals Pt Will Perform Grooming: with min guard assist;standing Pt Will Perform Lower Body Bathing: with min assist;sit to/from stand Pt Will Perform Lower Body Dressing: with min assist;sit to/from stand Pt Will Transfer to Toilet: with min guard assist;ambulating;bedside commode Pt Will Perform Toileting - Clothing Manipulation and hygiene: with min guard assist;sit to/from stand Additional ADL Goal #1: Pt will perform bed mobility with supervision in preparation for ADLs. Additional ADL Goal #2: Pt and family will be independent in donning and doffing L UE sling for comfort when OOB.  OT Frequency: Min 2X/week    Co-evaluation  AM-PAC OT "6 Clicks" Daily Activity     Outcome Measure Help from another person eating meals?: A Little Help from another person taking care of personal grooming?: A Little Help from another person toileting, which includes using toliet, bedpan, or urinal?: A Lot Help from another person bathing (including washing, rinsing, drying)?: A Lot Help from another person to put on and taking  off regular upper body clothing?: A Lot Help from another person to put on and taking off regular lower body clothing?: A Lot 6 Click Score: 14   End of Session Nurse Communication: Other (comment) (ordered L UE sling for ambulation)  Activity Tolerance: Patient limited by fatigue;Patient limited by pain Patient left: in bed;with call bell/phone within reach;with bed alarm set;with family/visitor present  OT Visit Diagnosis: Unsteadiness on feet (R26.81);Other abnormalities of gait and mobility (R26.89);Pain;Muscle weakness (generalized) (M62.81) Pain - Right/Left: Left Pain - part of body: Shoulder                Time: 3295-1884 OT Time Calculation (min): 27 min Charges:  OT General Charges $OT Visit: 1 Visit OT Evaluation $OT Eval Moderate Complexity: 1 Mod OT Treatments $Therapeutic Activity: 8-22 mins  Berna Spare, OTR/L Acute Rehabilitation Services Office: [phone]   Evern Bio 06/09/2022, 4:00 PM

## 2022-06-10 ENCOUNTER — Inpatient Hospital Stay (HOSPITAL_COMMUNITY): Payer: Medicare Other

## 2022-06-10 DIAGNOSIS — B9561 Methicillin susceptible Staphylococcus aureus infection as the cause of diseases classified elsewhere: Secondary | ICD-10-CM | POA: Diagnosis not present

## 2022-06-10 DIAGNOSIS — R7881 Bacteremia: Secondary | ICD-10-CM | POA: Diagnosis not present

## 2022-06-10 LAB — CULTURE, BLOOD (ROUTINE X 2): Culture: NO GROWTH

## 2022-06-10 LAB — BASIC METABOLIC PANEL
Anion gap: 11 (ref 5–15)
BUN: 22 mg/dL (ref 8–23)
CO2: 25 mmol/L (ref 22–32)
Calcium: 8.1 mg/dL — ABNORMAL LOW (ref 8.9–10.3)
Chloride: 95 mmol/L — ABNORMAL LOW (ref 98–111)
Creatinine, Ser: 0.8 mg/dL (ref 0.61–1.24)
GFR, Estimated: >60 mL/min (ref >60)
Glucose, Bld: 306 mg/dL — ABNORMAL HIGH (ref 70–99)
Potassium: 3.6 mmol/L (ref 3.5–5.1)
Sodium: 131 mmol/L — ABNORMAL LOW (ref 135–145)

## 2022-06-10 LAB — GLUCOSE, CAPILLARY
Glucose-Capillary: 240 mg/dL — ABNORMAL HIGH (ref 70–99)
Glucose-Capillary: 189 mg/dL — ABNORMAL HIGH (ref 70–99)
Glucose-Capillary: 170 mg/dL — ABNORMAL HIGH (ref 70–99)
Glucose-Capillary: 226 mg/dL — ABNORMAL HIGH (ref 70–99)

## 2022-06-10 MED ORDER — OXYCODONE HCL 5 MG PO TABS
10.0000 mg | ORAL_TABLET | ORAL | Status: DC | PRN
  Administered 2022-06-10 – 2022-06-13 (×10): 10 mg via ORAL
  Filled 2022-06-10 (×10): qty 2

## 2022-06-10 MED ORDER — GADOBUTROL 1 MMOL/ML IV SOLN
10.0000 mL | Freq: Once | INTRAVENOUS | Status: AC | PRN
  Administered 2022-06-10: 10 mL via INTRAVENOUS

## 2022-06-10 MED ORDER — INSULIN GLARGINE-YFGN 100 UNIT/ML ~~LOC~~ SOLN
70.0000 [IU] | Freq: Two times a day (BID) | SUBCUTANEOUS | Status: DC
  Administered 2022-06-10 (×2): 70 [IU] via SUBCUTANEOUS
  Filled 2022-06-10 (×4): qty 0.7

## 2022-06-10 NOTE — Progress Notes (Signed)
Occupational Therapy Treatment Patient Details Name: George [name] MRN: [id number] DOB: December 27, 1945 Today's Date: 06/10/2022   History of present illness Patient is 77 y.o. male who presented with chest pain. PMH significant of AAA, chronic pain, CAD s/p CABG, HTN, HLD, DM, chronic systolic CHF, and prostate CA. Recent stress test on 05/08/22 showed prior infarction and was intermediate risk with EF 30-44%.  He was admitted under cardiology with NSTEMI with plan for cardiac catheterization.  he spiked a fever to 102.  His only complaint is L shoulder pain that radiates into his L neck and jaw and into his L chest.  Hospitalist were consulted for that.  Eventually Ortho and ID were consulted. There is straightening of the normal cervical lordosis, anterior cervical osteophytes C4;ST consulted d/t c/o swallowing difficulty by pt/family.   OT comments  Pt slowly progressing towards goals this session, needing min-mod A for seated ADLs, mod A for bed mobility, and minA for transfers and taking lateral side steps at EOB x5 to R and L with 1 person HHA. Session conducted with L sling donned, pt states it does not help pain, educated pt it is only for comfort so he can choose whether or not to wear it. Pt reporting dizziness and needing to sit after taking steps, BP 155/71 (91). Pt presenting with impairments listed below, will follow acutely. Continue to recommend HHOT at d/c pending progression.   Recommendations for follow up therapy are one component of a multi-disciplinary discharge planning process, led by the attending physician.  Recommendations may be updated based on patient status, additional functional criteria and insurance authorization.    Assistance Recommended at Discharge Frequent or constant Supervision/Assistance  Patient can return home with the following  A little help with walking and/or transfers;A lot of help with bathing/dressing/bathroom;Assistance with cooking/housework;Direct  supervision/assist for medications management;Direct supervision/assist for financial management;Assist for transportation;Help with stairs or ramp for entrance   Equipment Recommendations  BSC/3in1    Recommendations for Other Services      Precautions / Restrictions Precautions Precautions: Fall Required Braces or Orthoses: Sling (for comfort when OOB) Restrictions Weight Bearing Restrictions: No       Mobility Bed Mobility Overal bed mobility: Needs Assistance Bed Mobility: Supine to Sit, Sit to Supine     Supine to sit: Mod assist, HOB elevated Sit to supine: Mod assist   General bed mobility comments: assist for trunk elevation and returning BLE's into bed    Transfers Overall transfer level: Needs assistance Equipment used: 1 person hand held assist Transfers: Sit to/from Stand Sit to Stand: Min assist, From elevated surface           General transfer comment: takes ~5 steps to R and L along EOB before reporting dizziness/needing to sit     Balance Overall balance assessment: Needs assistance Sitting-balance support: Feet supported, Single extremity supported Sitting balance-Leahy Scale: Fair     Standing balance support: Single extremity supported, Reliant on assistive device for balance Standing balance-Leahy Scale: Poor                             ADL either performed or assessed with clinical judgement   ADL Overall ADL's : Needs assistance/impaired                 Upper Body Dressing : Moderate assistance;Sitting Upper Body Dressing Details (indicate cue type and reason): donning LUE sling Lower Body Dressing: Moderate assistance;Sitting/lateral leans Lower  Body Dressing Details (indicate cue type and reason): reaches down to pull up socks while seated EOB Toilet Transfer: Minimal assistance;Ambulation;Regular Teacher, adult education Details (indicate cue type and reason): simulated via functional mobility         Functional  mobility during ADLs: Minimal assistance      Extremity/Trunk Assessment Upper Extremity Assessment Upper Extremity Assessment: LUE deficits/detail RUE Deficits / Details: WFLs LUE Deficits / Details: can flex shoulder to 90*, elbow/wrist/hand WFL LUE: Shoulder pain with ROM;Shoulder pain at rest LUE Coordination: decreased gross motor   Lower Extremity Assessment Lower Extremity Assessment: Defer to PT evaluation        Vision   Vision Assessment?: No apparent visual deficits   Perception Perception Perception: Not tested   Praxis Praxis Praxis: Not tested    Cognition Arousal/Alertness: Awake/alert Behavior During Therapy: WFL for tasks assessed/performed Overall Cognitive Status: Impaired/Different from baseline Area of Impairment: Orientation, Problem solving, Awareness, Attention                 Orientation Level: Time Current Attention Level: Selective       Awareness: Emergent Problem Solving: Difficulty sequencing, Requires verbal cues          Exercises      Shoulder Instructions       General Comments BP 155/71 (97) at end of session    Pertinent Vitals/ Pain       Pain Assessment Pain Assessment: Faces Pain Score: 3  Faces Pain Scale: Hurts little more Pain Location: Lt shoulder Pain Descriptors / Indicators: Aching, Discomfort, Grimacing, Guarding Pain Intervention(s): Limited activity within patient's tolerance, Monitored during session, Repositioned  Home Living                                          Prior Functioning/Environment              Frequency  Min 1X/week        Progress Toward Goals  OT Goals(current goals can now be found in the care plan section)  Progress towards OT goals: Progressing toward goals  Acute Rehab OT Goals OT Goal Formulation: With patient Time For Goal Achievement: 06/23/22 Potential to Achieve Goals: Good ADL Goals Pt Will Perform Grooming: with min guard  assist;standing Pt Will Perform Lower Body Bathing: with min assist;sit to/from stand Pt Will Perform Lower Body Dressing: with min assist;sit to/from stand Pt Will Transfer to Toilet: with min guard assist;ambulating;bedside commode Pt Will Perform Toileting - Clothing Manipulation and hygiene: with min guard assist;sit to/from stand Additional ADL Goal #1: Pt will perform bed mobility with supervision in preparation for ADLs. Additional ADL Goal #2: Pt and family will be independent in donning and doffing L UE sling for comfort when OOB.  Plan Discharge plan remains appropriate;Frequency needs to be updated    Co-evaluation                 AM-PAC OT "6 Clicks" Daily Activity     Outcome Measure   Help from another person eating meals?: A Little Help from another person taking care of personal grooming?: A Little Help from another person toileting, which includes using toliet, bedpan, or urinal?: A Little Help from another person bathing (including washing, rinsing, drying)?: A Lot Help from another person to put on and taking off regular upper body clothing?: A Lot Help from another person to put  on and taking off regular lower body clothing?: A Lot 6 Click Score: 15    End of Session Equipment Utilized During Treatment: Gait belt;Other (comment) (LUE sling)  OT Visit Diagnosis: Unsteadiness on feet (R26.81);Other abnormalities of gait and mobility (R26.89);Pain;Muscle weakness (generalized) (M62.81) Pain - Right/Left: Left Pain - part of body: Shoulder   Activity Tolerance Patient tolerated treatment well   Patient Left in bed;with call bell/phone within reach;with bed alarm set;with family/visitor present   Nurse Communication Mobility status        Time: 1521-1540 OT Time Calculation (min): 19 min  Charges: OT General Charges $OT Visit: 1 Visit OT Treatments $Self Care/Home Management : 8-22 mins  Carver Fila, OTD, OTR/L SecureChat Preferred Acute  Rehab [phone]   Carver Fila Koonce 06/10/2022, 4:10 PM

## 2022-06-10 NOTE — Plan of Care (Signed)
  Problem: Education: Goal: Knowledge of General Education information will improve Description: Including pain rating scale, medication(s)/side effects and non-pharmacologic comfort measures Outcome: Progressing   Problem: Clinical Measurements: Goal: Will remain free from infection Outcome: Progressing Goal: Respiratory complications will improve Outcome: Progressing   Problem: Activity: Goal: Risk for activity intolerance will decrease Outcome: Progressing   

## 2022-06-10 NOTE — Progress Notes (Signed)
Patient's wife relayed to CN that medical updates and disposition would be discuss with the wife only. Patient's wife also would like to speak to [facility] about disposition resources. CN relayed that 3E CM or SW will be available to discuss disposition concerns tomorrow.

## 2022-06-10 NOTE — Progress Notes (Signed)
PROGRESS NOTE    George [name]  UEA:[id number] DOB: 1945-04-07 DOA: 06/05/2022 PCP: Kirstie Peri, MD   Brief Narrative:  George [name] is a 77 y.o. male with past medical history of AAA, chronic pain, CAD s/p CABG, HTN, HLD, DM, chronic systolic CHF, and prostate CA who presented with chest pain.  Recent stress test on 05/08/22 showed prior infarction and was intermediate risk with EF 30-44%.  He was admitted under cardiology with NSTEMI with plan for cardiac catheterization.  he spiked a fever to 102.  His only complaint is L shoulder pain that radiates into his L neck and jaw and into his L chest.  Hospitalist were consulted for that.  Eventually Ortho and ID were consulted.  Assessment & Plan:   Principal Problem:   MSSA bacteremia Active Problems:   HTN (hypertension)   Uncontrolled type 2 diabetes mellitus with hyperglycemia (HCC)   Mixed hyperlipidemia   NSTEMI (non-ST elevated myocardial infarction) (HCC)   Sepsis due to methicillin susceptible Staphylococcus aureus (MSSA) without acute organ dysfunction (HCC)   Acute pain of left shoulder  NSTEMI (non-ST elevated myocardial infarction) (HCC) ruled out -Patient with prior h/o CAD s/p CABG presenting with chest pain and uptrending troponin, initially thought to be consistent with NSTEMI.  he was started on heparin drip and plan was cardiac cath but then it was opined by cardiology that there is very low concern of ACS so heparin was discontinued and no plan for cardiac cath and cardiology signed off on 06/07/2022.   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  Complains of neck pain and left shoulder pain.  Ortho was consulted.  They did arthrocentesis of the left shoulder but it was a dry tap.  MRI spine shows some concern of possible C4 discitis but no infection in the left shoulder.  MRI left shoulder shows multiple torn ligaments.  Orthopedics to manage that.  May need repeat MRI of the  cervical spine.  MRI sternum ordered by ID.  He is on cefazolin and ID is managing.  Per nurse, pain is not controlled.  Will increase oxycodone to 10 mg every 4 as needed.   Mixed hyperlipidemia -Continue high-dose Lipitor   Uncontrolled type 2 diabetes mellitus with hyperglycemia (HCC) Appears to be taking 70 units of long-acting insulin at night however he is on 60 units Semglee twice daily and is still hyperglycemic, will increase to 70 units twice daily and continue SSI.    HTN (hypertension) -Blood pressure very well-controlled.  Continue amlodipine and carvedilol -He was also on Toprol XL and Catapres at home but these are on hold currently.    Hypokalemia: Resolved  Family dynamics: Patient's granddaughter tends to be at bedside for most part.  Reportedly, she has been disrespectful to the staff.  Every day, and I explained my plan for the day, she tends to focus on her phone.  She would ask me question, the answer of which I had just explained in my plan.  When I would answer her question again, she would start calling someone in talking on the phone.  Patient's wife showed up later, she informed me that patient's granddaughter is not her granddaughter and she also does not like her to be present every time.  She asked me that I call her for plan of care if she is not available.  DVT prophylaxis: enoxaparin (LOVENOX) injection 40 mg Start: 06/08/22 1000Heparin drip   Code Status: Full Code  Family Communication: granddaughter  and wife present at bedside.  Plan of care discussed with patient in length and he/she verbalized understanding and agreed with it.  Status is: Inpatient Remains inpatient appropriate because: Needs final plan by ID and orthopedics.  May need placement to SNF.  Consulting PT OT.   Estimated body mass index is 28.15 kg/m as calculated from the following:   Height as of this encounter: 6\' 4"  (1.93 m).   Weight as of this encounter: 104.9 kg.    Nutritional  Assessment: Body mass index is 28.15 kg/m.Marland Kitchen Seen by dietician.  I agree with the assessment and plan as outlined below: Nutrition Status:        . Skin Assessment: I have examined the patient's skin and I agree with the wound assessment as performed by the wound care RN as outlined below:    Consultants:  ID and orthopedics Cardiology-signed off  Procedures:  As above  Antimicrobials:  Anti-infectives (From admission, onward)    Start     Dose/Rate Route Frequency Ordered Stop   06/06/22 1430  ceFAZolin (ANCEF) IVPB 2g/100 mL premix        2 g 200 mL/hr over 30 Minutes Intravenous Every 8 hours 06/06/22 1339     06/06/22 1400  doxycycline (VIBRA-TABS) tablet 100 mg  Status:  Discontinued        100 mg Oral Every 12 hours 06/06/22 1222 06/06/22 1449   06/06/22 1300  vancomycin (VANCOREADY) IVPB 1250 mg/250 mL  Status:  Discontinued        1,250 mg 166.7 mL/hr over 90 Minutes Intravenous Every 12 hours 06/05/22 2247 06/06/22 1339   06/06/22 0600  piperacillin-tazobactam (ZOSYN) IVPB 3.375 g  Status:  Discontinued        3.375 g 12.5 mL/hr over 240 Minutes Intravenous Every 8 hours 06/05/22 2247 06/06/22 1339   06/05/22 2345  vancomycin (VANCOREADY) IVPB 2000 mg/400 mL        2,000 mg 200 mL/hr over 120 Minutes Intravenous  Once 06/05/22 2247 06/06/22 0820   06/05/22 2345  piperacillin-tazobactam (ZOSYN) IVPB 3.375 g        3.375 g 100 mL/hr over 30 Minutes Intravenous  Once 06/05/22 2247 06/06/22 1004         Subjective: Patient's seen and examined.  Still complains of neck and shoulder pain but it is not different than yesterday.  Objective: Vitals:   06/10/22 0349 06/10/22 0713 06/10/22 1000 06/10/22 1026  BP: 136/64 (!) 157/70 (!) 145/73   Pulse: 75 76    Resp: 17 18    Temp: 97.7 F (36.5 C) 98 F (36.7 C)  98.7 F (37.1 C)  TempSrc: Oral Oral    SpO2: 97% 95%    Weight: 104.9 kg     Height:        Intake/Output Summary (Last 24 hours) at 06/10/2022  1237 Last data filed at 06/10/2022 0347 Gross per 24 hour  Intake 940 ml  Output 1400 ml  Net -460 ml    Filed Weights   06/08/22 0423 06/09/22 0404 06/10/22 0349  Weight: 103.8 kg 103.4 kg 104.9 kg    Examination:  General exam: Appears calm and comfortable  Respiratory system: Clear to auscultation. Respiratory effort normal. Cardiovascular system: S1 & S2 heard, RRR. No JVD, murmurs, rubs, gallops or clicks. No pedal edema. Gastrointestinal system: Abdomen is nondistended, soft and nontender. No organomegaly or masses felt. Normal bowel sounds heard. Central nervous system: Alert and oriented. No focal neurological deficits. Extremities: Symmetric 5  x 5 power. Skin: No rashes, lesions or ulcers.  Psychiatry: Judgement and insight appear normal. Mood & affect appropriate.   Data Reviewed: I have personally reviewed following labs and imaging studies  CBC: Recent Labs  Lab 06/05/22 2226 06/06/22 0643 06/07/22 0718 06/08/22 0042 06/09/22 0101  WBC 13.0* 12.2* 9.0 6.6 7.0  NEUTROABS 11.3*  --   --   --   --   HGB 12.9* 12.6* 11.9* 11.2* 10.9*  HCT 38.7* 37.2* 36.1* 33.3* 33.2*  MCV 81.0 79.7* 81.5 81.8 81.2  PLT 166 161 161 158 166    Basic Metabolic Panel: Recent Labs  Lab 06/06/22 0643 06/07/22 0659 06/08/22 0042 06/09/22 0101 06/10/22 0058  NA 134* 130* 131* 132* 131*  K 3.6 3.6 3.3* 3.8 3.6  CL 97* 95* 96* 98 95*  CO2 26 23 26 25 25   GLUCOSE 310* 331* 263* 193* 306*  BUN 21 24* 28* 22 22  CREATININE 1.02 0.95 1.05 0.75 0.80  CALCIUM 8.8* 8.6* 8.3* 8.1* 8.1*    GFR: Estimated Creatinine Clearance: 102.8 mL/min (by C-G formula based on SCr of 0.8 mg/dL). Liver Function Tests: No results for input(s): "AST", "ALT", "ALKPHOS", "BILITOT", "PROT", "ALBUMIN" in the last 168 hours. No results for input(s): "LIPASE", "AMYLASE" in the last 168 hours. No results for input(s): "AMMONIA" in the last 168 hours. Coagulation Profile: No results for input(s):  "INR", "PROTIME" in the last 168 hours. Cardiac Enzymes: No results for input(s): "CKTOTAL", "CKMB", "CKMBINDEX", "TROPONINI" in the last 168 hours. BNP (last 3 results) No results for input(s): "PROBNP" in the last 8760 hours. HbA1C: No results for input(s): "HGBA1C" in the last 72 hours.  CBG: Recent Labs  Lab 06/09/22 1145 06/09/22 1640 06/09/22 2044 06/10/22 0558 06/10/22 1102  GLUCAP 170* 239* 335* 240* 189*    Lipid Profile: No results for input(s): "CHOL", "HDL", "LDLCALC", "TRIG", "CHOLHDL", "LDLDIRECT" in the last 72 hours.  Thyroid Function Tests: No results for input(s): "TSH", "T4TOTAL", "FREET4", "T3FREE", "THYROIDAB" in the last 72 hours. Anemia Panel: No results for input(s): "VITAMINB12", "FOLATE", "FERRITIN", "TIBC", "IRON", "RETICCTPCT" in the last 72 hours. Sepsis Labs: No results for input(s): "PROCALCITON", "LATICACIDVEN" in the last 168 hours.  Recent Results (from the past 240 hour(s))  Culture, blood (Routine X 2) w Reflex to ID Panel     Status: Abnormal   Collection Time: 06/05/22 10:26 PM   Specimen: BLOOD LEFT HAND  Result Value Ref Range Status   Specimen Description BLOOD LEFT HAND  Final   Special Requests NONE  Final   Culture  Setup Time   Final    GRAM POSITIVE COCCI IN CLUSTERS IN BOTH AEROBIC AND ANAEROBIC BOTTLES CRITICAL VALUE NOTED.  VALUE IS CONSISTENT WITH PREVIOUSLY REPORTED AND CALLED VALUE.    Culture (A)  Final    STAPHYLOCOCCUS AUREUS SUSCEPTIBILITIES PERFORMED ON PREVIOUS CULTURE WITHIN THE LAST 5 DAYS. Performed at [facility], [address], [place], [place] [address]    Report Status 06/08/2022 FINAL  Final  Culture, blood (Routine X 2) w Reflex to ID Panel     Status: Abnormal   Collection Time: 06/05/22 10:26 PM   Specimen: BLOOD RIGHT HAND  Result Value Ref Range Status   Specimen Description BLOOD RIGHT HAND  Final   Special Requests   Final    BOTTLES DRAWN AEROBIC AND ANAEROBIC Blood Culture adequate  volume   Culture  Setup Time   Final    GRAM POSITIVE COCCI IN CLUSTERS IN  BOTH AEROBIC AND ANAEROBIC BOTTLES CRITICAL RESULT CALLED TO, READ BACK BY AND VERIFIED WITH: PHARMD A PAYTES [id number] AT 1325 BY CM Performed at [facility], [address], [place], [place] [address]    Culture STAPHYLOCOCCUS AUREUS (A)  Final   Report Status 06/08/2022 FINAL  Final   Organism ID, Bacteria STAPHYLOCOCCUS AUREUS  Final      Susceptibility   Staphylococcus aureus - MIC*    CIPROFLOXACIN <=0.5 SENSITIVE Sensitive     ERYTHROMYCIN <=0.25 SENSITIVE Sensitive     GENTAMICIN <=0.5 SENSITIVE Sensitive     OXACILLIN 0.5 SENSITIVE Sensitive     TETRACYCLINE <=1 SENSITIVE Sensitive     VANCOMYCIN <=0.5 SENSITIVE Sensitive     TRIMETH/SULFA <=10 SENSITIVE Sensitive     CLINDAMYCIN <=0.25 SENSITIVE Sensitive     RIFAMPIN <=0.5 SENSITIVE Sensitive     Inducible Clindamycin NEGATIVE Sensitive     LINEZOLID 2 SENSITIVE Sensitive     * STAPHYLOCOCCUS AUREUS  Blood Culture ID Panel (Reflexed)     Status: Abnormal   Collection Time: 06/05/22 10:26 PM  Result Value Ref Range Status   Enterococcus faecalis NOT DETECTED NOT DETECTED Final   Enterococcus Faecium NOT DETECTED NOT DETECTED Final   Listeria monocytogenes NOT DETECTED NOT DETECTED Final   Staphylococcus species DETECTED (A) NOT DETECTED Final    Comment: CRITICAL RESULT CALLED TO, READ BACK BY AND VERIFIED WITH: PHARMD A PAYTES [id number] AT 1325 BY CM    Staphylococcus aureus (BCID) DETECTED (A) NOT DETECTED Final    Comment: CRITICAL RESULT CALLED TO, READ BACK BY AND VERIFIED WITH: PHARMD A PAYTES [id number] AT 1325 BY CM    Staphylococcus epidermidis NOT DETECTED NOT DETECTED Final   Staphylococcus lugdunensis NOT DETECTED NOT DETECTED Final   Streptococcus species NOT DETECTED NOT DETECTED Final   Streptococcus agalactiae NOT DETECTED NOT DETECTED Final   Streptococcus pneumoniae NOT DETECTED NOT DETECTED Final   Streptococcus pyogenes NOT  DETECTED NOT DETECTED Final   A.calcoaceticus-baumannii NOT DETECTED NOT DETECTED Final   Bacteroides fragilis NOT DETECTED NOT DETECTED Final   Enterobacterales NOT DETECTED NOT DETECTED Final   Enterobacter cloacae complex NOT DETECTED NOT DETECTED Final   Escherichia coli NOT DETECTED NOT DETECTED Final   Klebsiella aerogenes NOT DETECTED NOT DETECTED Final   Klebsiella oxytoca NOT DETECTED NOT DETECTED Final   Klebsiella pneumoniae NOT DETECTED NOT DETECTED Final   Proteus species NOT DETECTED NOT DETECTED Final   Salmonella species NOT DETECTED NOT DETECTED Final   Serratia marcescens NOT DETECTED NOT DETECTED Final   Haemophilus influenzae NOT DETECTED NOT DETECTED Final   Neisseria meningitidis NOT DETECTED NOT DETECTED Final   Pseudomonas aeruginosa NOT DETECTED NOT DETECTED Final   Stenotrophomonas maltophilia NOT DETECTED NOT DETECTED Final   Candida albicans NOT DETECTED NOT DETECTED Final   Candida auris NOT DETECTED NOT DETECTED Final   Candida glabrata NOT DETECTED NOT DETECTED Final   Candida krusei NOT DETECTED NOT DETECTED Final   Candida parapsilosis NOT DETECTED NOT DETECTED Final   Candida tropicalis NOT DETECTED NOT DETECTED Final   Cryptococcus neoformans/gattii NOT DETECTED NOT DETECTED Final   Meth resistant mecA/C and MREJ NOT DETECTED NOT DETECTED Final    Comment: Performed at [facility], [address] [place], [place] [address]  Culture, blood (Routine X 2) w Reflex to ID Panel     Status: None (Preliminary result)   Collection Time: 06/07/22  6:58 AM   Specimen: BLOOD RIGHT HAND  Result Value Ref Range Status   Specimen Description BLOOD RIGHT HAND  Final   Special Requests   Final    BOTTLES DRAWN AEROBIC AND ANAEROBIC Blood Culture adequate volume   Culture   Final    NO GROWTH 3 DAYS Performed at [facility], [address], [place], [place] [address]    Report Status PENDING  Incomplete  Culture, blood (Routine X 2) w Reflex to ID  Panel     Status: None (Preliminary result)   Collection Time: 06/07/22  7:11 AM   Specimen: BLOOD LEFT HAND  Result Value Ref Range Status   Specimen Description BLOOD LEFT HAND  Final   Special Requests   Final    BOTTLES DRAWN AEROBIC AND ANAEROBIC Blood Culture adequate volume   Culture   Final    NO GROWTH 3 DAYS Performed at [facility], [address], [place], [place] [address]    Report Status PENDING  Incomplete     Radiology Studies: No results found.  Scheduled Meds:  amLODipine  10 mg Oral Daily   aspirin EC  81 mg Oral Daily   atorvastatin  80 mg Oral Daily   carvedilol  12.5 mg Oral BID   docusate sodium  100 mg Oral BID   enoxaparin (LOVENOX) injection  40 mg Subcutaneous Q24H   insulin aspart  0-20 Units Subcutaneous TID WC   insulin aspart  0-5 Units Subcutaneous QHS   insulin aspart  3 Units Subcutaneous TID WC   insulin glargine-yfgn  70 Units Subcutaneous BID   melatonin  3 mg Oral QHS   polyethylene glycol  17 g Oral Daily   sodium chloride flush  3 mL Intravenous Q12H   Continuous Infusions:  sodium chloride     sodium chloride 1 mL/kg/hr (06/06/22 0420)    ceFAZolin (ANCEF) IV 2 g (06/10/22 0644)     LOS: 5 days   Hughie Closs, MD [facility]  06/10/2022, 12:37 PM   *Please note that this is a verbal dictation therefore any spelling or grammatical errors are due to the "[employer]agon [employer] One" system interpretation.  Please page via Amion and do not message via secure chat for urgent patient care matters. Secure chat can be used for non urgent patient care matters.  How to contact the [facility] Attending or Consulting provider 7A - 7P or covering provider during after hours 7P -7A, for this patient?  Check the care team in [facility] and look for a) attending/consulting [facility] provider listed and b) the [facility] team listed. Page or secure chat 7A-7P. Log into [email or web address] and use [facility]'s universal password to access. If you do not have the  password, please contact the hospital operator. Locate the [facility] provider you are looking for under [facility] and page to a number that you can be directly reached. If you still have difficulty reaching the provider, please page the [facility] (Director on Call) for the Hospitalists listed on amion for assistance.

## 2022-06-11 ENCOUNTER — Ambulatory Visit: Payer: Medicare Other | Admitting: Nurse Practitioner

## 2022-06-11 DIAGNOSIS — M25512 Pain in left shoulder: Secondary | ICD-10-CM | POA: Diagnosis not present

## 2022-06-11 DIAGNOSIS — B9561 Methicillin susceptible Staphylococcus aureus infection as the cause of diseases classified elsewhere: Secondary | ICD-10-CM | POA: Diagnosis not present

## 2022-06-11 DIAGNOSIS — R7881 Bacteremia: Secondary | ICD-10-CM | POA: Diagnosis not present

## 2022-06-11 DIAGNOSIS — E1165 Type 2 diabetes mellitus with hyperglycemia: Secondary | ICD-10-CM | POA: Diagnosis not present

## 2022-06-11 DIAGNOSIS — M00012 Staphylococcal arthritis, left shoulder: Secondary | ICD-10-CM | POA: Diagnosis not present

## 2022-06-11 DIAGNOSIS — Z794 Long term (current) use of insulin: Secondary | ICD-10-CM | POA: Diagnosis not present

## 2022-06-11 DIAGNOSIS — M0000 Staphylococcal arthritis, unspecified joint: Secondary | ICD-10-CM

## 2022-06-11 DIAGNOSIS — A4101 Sepsis due to Methicillin susceptible Staphylococcus aureus: Secondary | ICD-10-CM | POA: Diagnosis not present

## 2022-06-11 LAB — BASIC METABOLIC PANEL
Anion gap: 9 (ref 5–15)
BUN: 14 mg/dL (ref 8–23)
CO2: 29 mmol/L (ref 22–32)
Calcium: 8.1 mg/dL — ABNORMAL LOW (ref 8.9–10.3)
Chloride: 96 mmol/L — ABNORMAL LOW (ref 98–111)
Creatinine, Ser: 0.72 mg/dL (ref 0.61–1.24)
GFR, Estimated: >60 mL/min (ref >60)
Glucose, Bld: 179 mg/dL — ABNORMAL HIGH (ref 70–99)
Potassium: 3.3 mmol/L — ABNORMAL LOW (ref 3.5–5.1)
Sodium: 134 mmol/L — ABNORMAL LOW (ref 135–145)

## 2022-06-11 LAB — GLUCOSE, CAPILLARY
Glucose-Capillary: 148 mg/dL — ABNORMAL HIGH (ref 70–99)
Glucose-Capillary: 203 mg/dL — ABNORMAL HIGH (ref 70–99)
Glucose-Capillary: 213 mg/dL — ABNORMAL HIGH (ref 70–99)
Glucose-Capillary: 87 mg/dL (ref 70–99)

## 2022-06-11 LAB — SPOTTED FEVER GROUP ANTIBODIES
Spotted Fever Group IgG: <1:64 {titer}
Spotted Fever Group IgM: <1:64 {titer}

## 2022-06-11 MED ORDER — POTASSIUM CHLORIDE CRYS ER 20 MEQ PO TBCR
40.0000 meq | EXTENDED_RELEASE_TABLET | ORAL | Status: AC
  Administered 2022-06-11 (×2): 40 meq via ORAL
  Filled 2022-06-11 (×2): qty 2

## 2022-06-11 MED ORDER — BISACODYL 10 MG RE SUPP
10.0000 mg | Freq: Once | RECTAL | Status: AC
  Administered 2022-06-11: 10 mg via RECTAL
  Filled 2022-06-11: qty 1

## 2022-06-11 MED ORDER — INSULIN GLARGINE-YFGN 100 UNIT/ML ~~LOC~~ SOLN
75.0000 [IU] | Freq: Two times a day (BID) | SUBCUTANEOUS | Status: DC
  Administered 2022-06-11 – 2022-06-12 (×2): 75 [IU] via SUBCUTANEOUS
  Filled 2022-06-11 (×6): qty 0.75

## 2022-06-11 MED ORDER — SODIUM CHLORIDE 0.9 % IV SOLN: INTRAVENOUS | Status: DC

## 2022-06-11 NOTE — Care Management Important Message (Signed)
Important Message  Patient Details  Name: George [name] MRN: [id number] Date of Birth: [birth year]   Medicare Important Message Given:  Yes     Renie Ora 06/11/2022, 9:27 AM

## 2022-06-11 NOTE — Progress Notes (Deleted)
Office Visit    Patient Name: George [name] Date of Encounter: 05/02/2022  PCP:  Kirstie Peri, MD   [facility]  Cardiologist:  Nona Dell, MD  Advanced Practice Provider:  No care team member to display Electrophysiologist:  None   Chief Complaint    George [name] is a 77 y.o. male with a hx of CAD, s/p CABG in 2014 (LIMA-LAD, SVG-ramus, SVG-OM 2), mixed hyperlipidemia, HFmrEF, type 2 diabetes, hypertension, GERD, ascending aortic aneurysm, and chronic back pain, who presents today for chest pain evaluation.  Past Medical History    Past Medical History:  Diagnosis Date   Ascending aortic aneurysm (HCC)    4.1 cm in 2019   Chronic back pain    Coronary artery disease    Status post CABG 2014 (LIMA to LAD, SVG to ramus, SVG to OM 2)   Dermatophytosis of the body    Eczema    Essential hypertension    GERD (gastroesophageal reflux disease)    Herpes zoster without mention of complication    History of bronchitis    History of claustrophobia    HOH (hard of hearing)    Mixed hyperlipidemia    Prostate cancer (HCC)    Psoriasis and similar disorder    Sinusitis    Type 2 diabetes mellitus (HCC)    Past Surgical History:  Procedure Laterality Date   CATARACT EXTRACTION W/PHACO Left 04/04/2014   Procedure: CATARACT EXTRACTION PHACO AND INTRAOCULAR LENS PLACEMENT (IOC);  Surgeon: Susa Simmonds, MD;  Location: [facility];  Service: Ophthalmology;  Laterality: Left;  CDE:4.49   CATARACT EXTRACTION W/PHACO Right 01/02/2015   Procedure: CATARACT EXTRACTION PHACO AND INTRAOCULAR LENS PLACEMENT; CDE:  6.36;  Surgeon: Susa Simmonds, MD;  Location: [facility];  Service: Ophthalmology;  Laterality: Right;   CIRCUMCISION  2010   CORONARY ARTERY BYPASS GRAFT N/A 08/13/2012   Procedure: CORONARY ARTERY BYPASS GRAFTING (CABG);  Surgeon: Kerin Perna, MD;  Location: [facility] OR;  Service: Open Heart Surgery;  Laterality: N/A;   ENDOVEIN HARVEST OF GREATER  SAPHENOUS VEIN Right 08/13/2012   Procedure: ENDOVEIN HARVEST OF GREATER SAPHENOUS VEIN;  Surgeon: Kerin Perna, MD;  Location: [facility] OR;  Service: Open Heart Surgery;  Laterality: Right;   INTRAOPERATIVE TRANSESOPHAGEAL ECHOCARDIOGRAM N/A 08/13/2012   Procedure: INTRAOPERATIVE TRANSESOPHAGEAL ECHOCARDIOGRAM;  Surgeon: Kerin Perna, MD;  Location: [facility] OR;  Service: Open Heart Surgery;  Laterality: N/A;   LEFT HEART CATHETERIZATION WITH CORONARY ANGIOGRAM N/A 08/07/2012   Procedure: LEFT HEART CATHETERIZATION WITH CORONARY ANGIOGRAM;  Surgeon: Peter M Swaziland, MD;  Location: [facility] CATH LAB;  Service: Cardiovascular;  Laterality: N/A;   RADIOACTIVE SEED IMPLANT N/A 11/25/2017   Procedure: RADIOACTIVE SEED IMPLANT/BRACHYTHERAPY IMPLANT;  Surgeon: Bjorn Pippin, MD;  Location: [facility];  Service: Urology;  Laterality: N/A;   SEED IMPLANT DONE AT [place]  01/2017    Allergies  No Known Allergies  History of Present Illness    George [name] is a 77 y.o. male with a PMH as mentioned above.  Last seen by Dr. Diona Browner on June 19, 2020.  At that time, patient was noting more shortness of breath and fatigue with activity over the past several months.  Denied any chest pain.  Echo revealed EF 40 to 45%, full report below, was started on Entresto.    Seen by Rennis Harding, NP in September 2022 for follow-up. Was unable to perform Lexiscan Myoview due to history  of claustrophobia.  Stress test arranged was deemed to be normal, however however was considered moderate risk due to decreased ventricular function, no evidence of ischemia noted.    He recently contacted our office noting left arm/chest pain with activity.  Noted shortness of breath after walking short distances, symptoms started 2 months ago and have gradually gotten worse over time per his report.  Today he presents for evaluation.  He states for the past few months, has noticed chest discomfort when doing strenuous activities/L arm  pain, has gotten a little worse recently and decided to get checked out. Located in the middle of his chest, non-radiating. Unable to walk fast or walk uphill, also endorses [facility] with exertion. Has to sit down for relief of symptoms, symptoms are worse when continuing to exert himself.  5-6/10 in intensity. When reviewing meds with patient and wife, not taking Entresto for about the past year, was stopped by PCP. Denies any palpitations, syncope, presyncope, dizziness, orthopnea, PND, swelling or significant weight changes, acute bleeding, or claudication.  SH: Works as a [employer] Studies Reviewed:   The following studies were reviewed today:   EKG:  EKG is ordered today.  The ekg ordered today demonstrates SR, 78 bpm, LBBB, no acute ischemic changes.   Myoview 10/2020:   The study is normal. The study is intermediate risk.   No ST deviation was noted.   Left ventricular function is abnormal. Global function is mildly reduced.   Prior study not available for comparison.   Findings: Negative for stress induced arrhythmias. Hypertensive at baseline. No evidence of ischemia or infarction. LV function is globally, mildly reduced.  LVEF 48%.   Conclusions: Stress test is negative. This is consistent with a moderate risk exam due to decreased ventricular function.   Echo 06/2020:   1. Inferior hypokinesis abnormal septal motion with hypokinesis . Left  ventricular ejection fraction, by estimation, is 40 to 45%. The left  ventricle has mildly decreased function. The left ventricle demonstrates  regional wall motion abnormalities (see  scoring diagram/findings for description). The left ventricular internal  cavity size was mildly dilated. There is moderate asymmetric left  ventricular hypertrophy of the basal and septal segments. Left ventricular  diastolic parameters are consistent with   Grade I diastolic dysfunction (impaired relaxation).   2. Right ventricular  systolic function is normal. The right ventricular  size is normal. There is normal pulmonary artery systolic pressure.   3. Left atrial size was moderately dilated.   4. The mitral valve is abnormal. Trivial mitral valve regurgitation. No  evidence of mitral stenosis.   5. The aortic valve is tricuspid. Aortic valve regurgitation is trivial.  Mild aortic valve sclerosis is present, with no evidence of aortic valve  stenosis.   6. Aortic dilatation noted. There is mild dilatation of the ascending  aorta, measuring 41 mm.   7. The inferior vena cava is normal in size with greater than 50%  respiratory variability, suggesting right atrial pressure of 3 mmHg.  Recent Labs: 06/09/2022: Hemoglobin 10.9; Platelets 166 06/11/2022: BUN 14; Creatinine, Ser 0.72; Potassium 3.3; Sodium 134  Recent Lipid Panel    Component Value Date/Time   CHOL 144 06/06/2022 0643   TRIG 285 (H) 06/06/2022 0643   HDL 37 (L) 06/06/2022 0643   CHOLHDL 3.9 06/06/2022 0643   VLDL 57 (H) 06/06/2022 0643   LDLCALC 50 06/06/2022 0643    Home Medications   Current Meds  Medication Sig   amLODipine (NORVASC)  10 MG tablet Take 1 tablet (10 mg total) by mouth daily.   atorvastatin (LIPITOR) 20 MG tablet Take 20 mg by mouth daily.   cloNIDine (CATAPRES) 0.1 MG tablet Take 1 tablet by mouth daily.   insulin glargine (LANTUS) 100 UNIT/ML Solostar Pen Inject 70 Units into the skin at bedtime.   nitroGLYCERIN (NITROSTAT) 0.4 MG SL tablet Place 1 tablet (0.4 mg total) under the tongue every 5 (five) minutes x 3 doses as needed for chest pain (if no relief after 3rd dose, proceed to ED for evaluation or call 911).   NOVOLOG FLEXPEN 100 UNIT/ML FlexPen INJECT 25-31 UNITS INTO THE SKIN THREE TIMES DAILY BEFORE MEALS (Patient taking differently: Inject 50 Units into the skin 3 (three) times daily with meals.)   metoprolol succinate (TOPROL XL) 50 MG 24 hr tablet Take 1 tablet (50 mg total) by mouth daily.     Review of Systems     All other systems reviewed and are otherwise negative except as noted above.  Physical Exam    VS:  There were no vitals taken for this visit. , BMI There is no height or weight on file to calculate BMI.  Wt Readings from Last 3 Encounters:  06/11/22 234 lb 5.6 oz (106.3 kg)  05/08/22 239 lb (108.4 kg)  05/02/22 239 lb 9.6 oz (108.7 kg)     GEN: Well nourished, well developed, 77 y.o. male in no acute distress. HEENT: normal. Neck: Supple, no JVD, carotid bruits, or masses. Cardiac: S1/S2, RRR, no murmurs, rubs, or gallops. No clubbing, cyanosis, edema.  Radials/PT 2+ and equal bilaterally.  Respiratory:  Respirations regular and unlabored, clear to auscultation bilaterally. MS: No deformity or atrophy. Skin: Warm and dry, no rash. Neuro:  Strength and sensation are intact. Psych: Normal affect.  Assessment & Plan    Angina pectoris / CAD, s/p CABG, DOE Ongoing symptoms with exertion x several months, little worse recently. Myoview in 2022 negative for ischemia. EKG today negative for acute ischemic changes. Discussed several options for ischemic evaluation and patient is agreeable with sit up GXT, as he cannot lay down for test d/t hx of claustrophobia. Will also update Echo. Continue atorvastatin and NTG PRN. Stopping Metoprolol and switching to Coreg for better BP control. Heart healthy diet encouraged. ED precautions discussed.  Shared Decision Making/Informed Consent The risks [chest pain, shortness of breath, cardiac arrhythmias, dizziness, blood pressure fluctuations, myocardial infarction, stroke/transient ischemic attack, nausea, vomiting, allergic reaction, radiation exposure, metallic taste sensation and life-threatening complications (estimated to be 1 in 10,000)], benefits (risk stratification, diagnosing coronary artery disease, treatment guidance) and alternatives of a nuclear stress test were discussed in detail with Mr. Barcroft and he agrees to proceed.    2.  HFmrEF Echo 06/2020 revealed EF 40-45, RWMA, moderate asymmetric LVH, grade 1 DD. Was started on Entresto based on these findings, no longer on Entresto per wife's report. Updating Echo as mentioned above. Euvolemic and well compensated on exam. Switching Metoprolol to Coreg, may need to re-start Entresto based on Echo findings if necessary. Low sodium diet, fluid restriction <2L, and daily weights encouraged. Educated to contact our office for weight gain of 2 lbs overnight or 5 lbs in one week.  3. Mixed HLD No recent lipid panel on file. At next OV, will request labs from PCP, because managed by PCP. Continue Lipitor. Heart healthy diet encouraged.   4. HTN BP elevated today. Will stop metoprolol and switch to Coreg. Continue amlodipine and depending on Echo  results, may need to restart Entresto. Discussed to monitor BP at home at least 2 hours after medications and sitting for 5-10 minutes. Heart healthy diet encouraged.   5. Ascending aortic aneurysm Mild dilatation noted of ascending aorta on Echo in 2022 noted, measuring 41 mm. Updating Echo as mentioned above. Care precautions and ED precautions discussed. Heart healthy diet encouraged.   Disposition: Follow up in 4-6 week(s) with Nona Dell, MD or APP.  Signed, Sharlene Dory, NP 06/11/2022, 11:23 AM [facility]

## 2022-06-11 NOTE — Progress Notes (Signed)
Physical Therapy Treatment Patient Details Name: George [name] MRN: [id number] DOB: [birth year] Today's Date: 06/11/2022   History of Present Illness Patient is 77 y.o. male who presented with chest pain. PMH significant of AAA, chronic pain, CAD s/p CABG, HTN, HLD, DM, chronic systolic CHF, and prostate CA. Recent stress test on 05/08/22 showed prior infarction and was intermediate risk with EF 30-44%.  He was admitted under cardiology with NSTEMI with plan for cardiac catheterization.  he spiked a fever to 102.  His only complaint is L shoulder pain that radiates into his L neck and jaw and into his L chest.  Hospitalist were consulted for that.  Eventually Ortho and ID were consulted. There is straightening of the normal cervical lordosis, anterior cervical osteophytes C4;ST consulted d/t c/o swallowing difficulty by pt/family.    PT Comments    Pt with poor tolerance to treatment today. Pt appeared confused today [place] however when asked if that is where he lives pt states "no I live in [place]". Pt then screamed out for family member when touched. Per RN, pt has dementia at baseline however no mention of dementia in H&P nor family present to confirm. Pt adamantly refused OOB mobility today despite Max encouragement from PT and mobility specialist. Pt was received halfway out of the bed and was repositioned for safety and comfort with +2 Max A. DC recs updated to SNF. Pt will continue to follow.  Recommendations for follow up therapy are one component of a multi-disciplinary discharge planning process, led by the attending physician.  Recommendations may be updated based on patient status, additional functional criteria and insurance authorization.  Follow Up Recommendations  Can patient physically be transported by private vehicle: No    Assistance Recommended at Discharge Frequent or constant Supervision/Assistance  Patient can return home with the following A little  help with walking and/or transfers;A little help with bathing/dressing/bathroom;Assistance with cooking/housework;Direct supervision/assist for medications management;Assist for transportation;Help with stairs or ramp for entrance   Equipment Recommendations  None recommended by PT    Recommendations for Other Services       Precautions / Restrictions Precautions Precautions: Fall Restrictions Weight Bearing Restrictions: No     Mobility  Bed Mobility Overal bed mobility: Needs Assistance             General bed mobility comments: Pt required +2 Max A for repositioning in bed and as pt was received halfway hanging off. Pt able to provide some assistance by pushing through legs.    Transfers                   General transfer comment: Pt adamently refused despite max encouragement    Ambulation/Gait               General Gait Details: Pt refused   Stairs             Wheelchair Mobility    Modified Rankin (Stroke Patients Only)       Balance                                            Cognition Arousal/Alertness: Awake/alert Behavior During Therapy: Anxious, Agitated, Restless Overall Cognitive Status: No family/caregiver present to determine baseline cognitive functioning Area of Impairment: Orientation, Problem solving, Awareness, Attention  Orientation Level: Time, Situation Current Attention Level: Selective       Awareness: Emergent   General Comments: Pt seemed disoriented today stating that [place] however when asked if that is where he lives pt states "no I live in [place]". Pt then screamed out for family member when touched. Per RN, pt has dementia at baseline however no family present to confirm.        Exercises      General Comments General comments (skin integrity, edema, etc.): VSS on RA      Pertinent Vitals/Pain Pain Assessment Pain Assessment:  Faces Faces Pain Scale: Hurts little more Pain Location: L shoulder Pain Descriptors / Indicators: Aching, Discomfort, Grimacing, Guarding Pain Intervention(s): Limited activity within patient's tolerance, Repositioned, Monitored during session    Home Living                          Prior Function            PT Goals (current goals can now be found in the care plan section) Progress towards PT goals: Not progressing toward goals - comment (Pt refused)    Frequency    Min 3X/week      PT Plan Discharge plan needs to be updated    Co-evaluation              AM-PAC PT "6 Clicks" Mobility   Outcome Measure  Help needed turning from your back to your side while in a flat bed without using bedrails?: A Little Help needed moving from lying on your back to sitting on the side of a flat bed without using bedrails?: A Lot Help needed moving to and from a bed to a chair (including a wheelchair)?: A Lot Help needed standing up from a chair using your arms (e.g., wheelchair or bedside chair)?: A Lot Help needed to walk in hospital room?: Total Help needed climbing 3-5 steps with a railing? : Total 6 Click Score: 11    End of Session   Activity Tolerance: Patient limited by pain;Other (comment) (Pt limited by cognitive deficits) Patient left: in bed;with call bell/phone within reach;with bed alarm set Nurse Communication: Mobility status PT Visit Diagnosis: Muscle weakness (generalized) (M62.81);Other abnormalities of gait and mobility (R26.89);Difficulty in walking, not elsewhere classified (R26.2);Pain Pain - Right/Left: Left Pain - part of body: Shoulder     Time: 1610-9604 PT Time Calculation (min) (ACUTE ONLY): 8 min  Charges:  $Therapeutic Activity: 8-22 mins                     George [name], PT, DPT Acute Rehab Services [phone]    George [name] 06/11/2022, 3:24 PM

## 2022-06-11 NOTE — Progress Notes (Signed)
[facility]  Date of Admission:  06/05/2022           Reason for visit: Follow up on MSSA bacteremia  Current antibiotics: Cefazolin  ASSESSMENT:    77 y.o. male admitted with:  MSSA Bacteremia: Patient presenting with concern for NSTEMI found to have high-grade MSSA bacteremia of unclear source. Blood cultures are positive for MSSA from 06/05/22.  TTE was completed for dyspnea outpatient on 05/28/22 that did not see any vegetations. Repeat blood cultures drawn 06/07/22 NGTD. Left sternoclavicular septic arthritis/osteomyelitis: MRI 06/10/2022 shows inflammatory changes surrounding the left [place] joint with marrow edema suspicious for septic arthritis and early osteomyelitis. Neck pain: Cervical spine MRI 5/24 with some signal abnormalities at C4 possibly reflective of early discitis/osteomyelitis.  Short-term follow-up MRI recommended in approximately 3 weeks to assess for interval change. Diabetes: A1c 8.6.   RECOMMENDATIONS:    Continue cefazolin 2 g every 8 hours Follow-up repeat blood cultures Recommend TEE Recommend CVTS evaluation for findings of sternoclavicular joint septic arthritis and early osteomyelitis on MRI Will likely get another cervical MRI in about 3 weeks to see if cervical discitis/OM has declared itself Glucose control, lab monitoring Will follow   Principal Problem:   MSSA bacteremia Active Problems:   HTN (hypertension)   Uncontrolled type 2 diabetes mellitus with hyperglycemia (HCC)   Mixed hyperlipidemia   NSTEMI (non-ST elevated myocardial infarction) (HCC)   Sepsis due to methicillin susceptible Staphylococcus aureus (MSSA) without acute organ dysfunction (HCC)   Acute pain of left shoulder    MEDICATIONS:    Scheduled Meds:  amLODipine  10 mg Oral Daily   aspirin EC  81 mg Oral Daily   atorvastatin  80 mg Oral Daily   carvedilol  12.5 mg Oral BID   docusate sodium  100 mg Oral BID   enoxaparin (LOVENOX)  injection  40 mg Subcutaneous Q24H   insulin aspart  0-20 Units Subcutaneous TID WC   insulin aspart  0-5 Units Subcutaneous QHS   insulin aspart  3 Units Subcutaneous TID WC   insulin glargine-yfgn  75 Units Subcutaneous BID   melatonin  3 mg Oral QHS   polyethylene glycol  17 g Oral Daily   sodium chloride flush  3 mL Intravenous Q12H   Continuous Infusions:  sodium chloride     sodium chloride 1 mL/kg/hr (06/06/22 0420)    ceFAZolin (ANCEF) IV 2 g (06/11/22 0616)   PRN Meds:.sodium chloride, acetaminophen, diclofenac Sodium, nitroGLYCERIN, ondansetron (ZOFRAN) IV, oxyCODONE, sodium chloride flush  SUBJECTIVE:   24 hour events:  No acute events noted Patient afebrile, Tmax 98.5 Repeat blood cultures 5/24 no growth to date MRI sternum concerning for left [place] joint septic arthritis and osteomyelitis  Patient reports ongoing neck pain and sternoclavicular pain.  No fevers.  Tolerating antibiotics.  Review of Systems  All other systems reviewed and are negative.     OBJECTIVE:   Blood pressure (!) 154/64, pulse 80, temperature 97.6 F (36.4 C), temperature source Oral, resp. rate 14, height 6\' 4"  (1.93 m), weight 106.3 kg, SpO2 94 %. Body mass index is 28.53 kg/m.  Physical Exam Constitutional:      Appearance: Normal appearance.  Eyes:     Extraocular Movements: Extraocular movements intact.     Conjunctiva/sclera: Conjunctivae normal.  Pulmonary:     Effort: Pulmonary effort is normal. No respiratory distress.  Musculoskeletal:     Right lower leg: No edema.     Left  lower leg: No edema.     Comments: Pain and tenderness over his left [place] joint area with mild erythema  Skin:    General: Skin is warm and dry.  Neurological:     General: No focal deficit present.     Mental Status: He is alert and oriented to person, place, and time.  Psychiatric:        Mood and Affect: Mood normal.        Behavior: Behavior normal.      Lab Results: Lab Results  Component  Value Date   WBC 7.0 06/09/2022   HGB 10.9 (L) 06/09/2022   HCT 33.2 (L) 06/09/2022   MCV 81.2 06/09/2022   PLT 166 06/09/2022    Lab Results  Component Value Date   NA 134 (L) 06/11/2022   K 3.3 (L) 06/11/2022   CO2 29 06/11/2022   GLUCOSE 179 (H) 06/11/2022   BUN 14 06/11/2022   CREATININE 0.72 06/11/2022   CALCIUM 8.1 (L) 06/11/2022   GFRNONAA >60 06/11/2022   GFRAA >60 08/11/2018    Lab Results  Component Value Date   ALT 35 08/11/2018   AST 29 08/11/2018   ALKPHOS 122 08/11/2018   BILITOT 0.6 08/11/2018       Component Value Date/Time   CRP 25.2 (H) 06/07/2022 0718       Component Value Date/Time   ESRSEDRATE 84 (H) 06/07/2022 0718     I have reviewed the micro and lab results in Epic.  Imaging: MR STERNUM W WO CONTRAST  Result Date: 06/11/2022 CLINICAL DATA:  MSSA bacteremia with pain and tenderness over the left sternoclavicular joint. Possible C4 discitis. EXAM: MR STERNUM WITHOUT AND WITH CONTRAST TECHNIQUE: Multiplanar imaging of the anterior chest was performed with attention to the sternal and sternoclavicular joint. Study includes imaging following intravenous administration of contrast. CONTRAST:  10mL GADAVIST GADOBUTROL 1 MMOL/ML IV SOLN COMPARISON:  Chest CT 06/05/2022. FINDINGS: Bones/Joint/Cartilage Status post median sternotomy with associated susceptibility artifact. Allowing for this artifact and correlated with the recent CT, the sternotomy appears healed. There is no suspicious marrow signal abnormality or enhancement within the lower sternum. However, there are inflammatory changes within the soft tissue surrounding the left sternoclavicular joint which have progressed from the recent CT. There is low-level marrow edema within the left aspect of the sternal manubrium and the inferior aspect of the left clavicular head. No cortical destruction identified. There is moderate irregular synovial thickening at the left sternoclavicular joint without  drainable fluid collection. The right sternoclavicular joint appears unremarkable. Ligaments No ligamentous abnormalities are identified. Muscles and Tendons Mild edema superior medially in the left pectoralis major muscle without focal fluid collection or atrophy. Soft tissue As above, there is inflammation in the soft tissues surrounding the left sternoclavicular joint associated with probable underlying synovitis. These findings appear mildly progressive compared with the recent chest CT. The inflammatory changes extend superiorly, but do not exert any significant mass effect on the mediastinal structures. There is no focal drainable fluid collection. IMPRESSION: 1. Progressive inflammatory changes surrounding the left sternoclavicular joint compared with recent chest CT. There is low-level marrow edema within the left aspect of the sternal manubrium and the inferior aspect of the left clavicular head without cortical destruction or drainable fluid collection. These findings are suspicious for septic arthritis and early osteomyelitis in this clinical setting. Differential includes other inflammatory and crystalline arthropathies. 2. The right sternoclavicular joint appears unremarkable. 3. Status post median sternotomy. Electronically Signed   By:  Carey Bullocks M.D.   On: 06/11/2022 08:25     Imaging independently reviewed in Epic.    Vedia Coffer for Infectious Disease [facility] [phone] pager 06/11/2022, 1:49 PM

## 2022-06-11 NOTE — Consult Note (Signed)
[address]            [place] [address]          [phone]       [name] Medical Record #[id number] Date of Birth: 02-Jul-1945  Gretta Arab, DO Kirstie Peri, MD Chief compliant--pain over left shoulder  History of Present Illness:     Patient examined, images of MRI of sternum personally reviewed and discussed with patient and family. I was asked to evaluate this 77 year old male diabetic recently diagnosed with infection of the left sternoclavicular joint.  The patient had CABG x 3 in 2014 for unstable angina with severe three-vessel CAD.  Patient was hospitalized 6 days ago for chest pain and shoulder pain and underwent extensive diagnostic evaluation which demonstrated MSSA bacteremia, 2D echocardiogram which showed EF of 50% without endocarditis, cervical spine MRI showing possible discitis of C4 vertebral body, MRI showing probable inflammation of the left [place] joint with marrow edema but without fluid collection or bone destruction.  Patient has been placed on IV cefazolin 2 g every 8 hours and his symptoms of shoulder pain and neck pain have improved.  Patient is not taking any immunosuppressive drugs.  TEE is also planned to further evaluate possibility of endocarditis.  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  Lives independently.   Past Medical History:  Diagnosis Date   Ascending aortic aneurysm (HCC)    4.1 cm in 2019   Chronic back pain    Coronary artery disease    Status post CABG 2014 (LIMA to LAD, SVG to ramus, SVG to OM 2)   Dermatophytosis of the body    Eczema    Essential hypertension    GERD (gastroesophageal reflux disease)    Herpes zoster without mention of complication    History of bronchitis    History of claustrophobia    HOH (hard of hearing)    Mixed hyperlipidemia    Prostate cancer  (HCC)    Psoriasis and similar disorder    Sinusitis    Type 2 diabetes mellitus (HCC)     Past Surgical History:  Procedure Laterality Date   CATARACT EXTRACTION W/PHACO Left 04/04/2014   Procedure: CATARACT EXTRACTION PHACO AND INTRAOCULAR LENS PLACEMENT (IOC);  Surgeon: Susa Simmonds, MD;  Location: [facility];  Service: Ophthalmology;  Laterality: Left;  CDE:4.49   CATARACT EXTRACTION W/PHACO Right 01/02/2015   Procedure: CATARACT EXTRACTION PHACO AND INTRAOCULAR LENS PLACEMENT; CDE:  6.36;  Surgeon: Susa Simmonds, MD;  Location: [facility];  Service: Ophthalmology;  Laterality: Right;   CIRCUMCISION  2010   CORONARY ARTERY BYPASS GRAFT N/A 08/13/2012   Procedure: CORONARY ARTERY BYPASS GRAFTING (CABG);  Surgeon: Kerin Perna, MD;  Location: [facility] OR;  Service: Open Heart Surgery;  Laterality: N/A;   ENDOVEIN HARVEST OF GREATER SAPHENOUS VEIN Right 08/13/2012   Procedure: ENDOVEIN HARVEST OF GREATER SAPHENOUS VEIN;  Surgeon: Kerin Perna, MD;  Location: [facility] OR;  Service: Open Heart Surgery;  Laterality: Right;   INTRAOPERATIVE TRANSESOPHAGEAL ECHOCARDIOGRAM N/A 08/13/2012   Procedure: INTRAOPERATIVE TRANSESOPHAGEAL ECHOCARDIOGRAM;  Surgeon: Kerin Perna, MD;  Location: [facility] OR;  Service: Open Heart Surgery;  Laterality: N/A;   LEFT HEART CATHETERIZATION WITH CORONARY ANGIOGRAM N/A 08/07/2012   Procedure: LEFT HEART CATHETERIZATION  WITH CORONARY ANGIOGRAM;  Surgeon: Elizzie Westergard M [place], MD;  Location: [facility] CATH LAB;  Service: Cardiovascular;  Laterality: N/A;   RADIOACTIVE SEED IMPLANT N/A 11/25/2017   Procedure: RADIOACTIVE SEED IMPLANT/BRACHYTHERAPY IMPLANT;  Surgeon: Bjorn Pippin, MD;  Location: [facility];  Service: Urology;  Laterality: N/A;   SEED IMPLANT DONE AT [place]  01/2017    Social History   Tobacco Use  Smoking Status Former   Packs/day: 1.00   Years: 4.00   Additional pack years: 0.00   Total pack years: 4.00   Types: Cigarettes   Start date: 03/04/1965    Quit date: 03/05/1967   Years since quitting: 55.3   Passive exposure: Never  Smokeless Tobacco Never    Social History   Substance and Sexual Activity  Alcohol Use No   Alcohol/week: 0.0 standard drinks of alcohol    Social History   Socioeconomic History   Marital status: Married    Spouse name: Not on file   Number of children: Not on file   Years of education: Not on file   Highest education level: Not on file  Occupational History    Comment: logger  Tobacco Use   Smoking status: Former    Packs/day: 1.00    Years: 4.00    Additional pack years: 0.00    Total pack years: 4.00    Types: Cigarettes    Start date: 03/04/1965    Quit date: 03/05/1967    Years since quitting: 55.3    Passive exposure: Never   Smokeless tobacco: Never  Vaping Use   Vaping Use: Never used  Substance and Sexual Activity   Alcohol use: No    Alcohol/week: 0.0 standard drinks of alcohol   Drug use: No   Sexual activity: Not on file  Other Topics Concern   Not on file  Social History Narrative   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   Social Determinants of Health   Financial Resource Strain: Not on file  Food Insecurity: No Food Insecurity (06/06/2022)   Hunger Vital Sign    Worried About Running Out of Food in the Last Year: Never true    Ran Out of Food in the Last Year: Never true  Transportation Needs: No Transportation Needs (06/06/2022)   PRAPARE - Administrator, Civil Service (Medical): No    Lack of Transportation (Non-Medical): No  Physical Activity: Not on file  Stress: Not on file  Social Connections: Not on file  Intimate Partner Violence: Not At Risk (06/06/2022)   Humiliation, Afraid, Rape, and Kick questionnaire    Fear of Current or Ex-Partner: No    Emotionally Abused: No    Physically Abused: No    Sexually Abused: No    No Known Allergies  Current Facility-Administered Medications  Medication Dose Route Frequency Provider Last Rate  Last Admin   0.9 %  sodium chloride infusion  250 mL Intravenous PRN Laverda Page B, NP       0.9% sodium chloride infusion  1 mL/kg/hr Intravenous Continuous Laverda Page B, NP 103.6 mL/hr at 06/06/22 0420 1 mL/kg/hr at 06/06/22 0420   acetaminophen (TYLENOL) tablet 650 mg  650 mg Oral Q4H PRN Laverda Page B, NP   650 mg at 06/10/22 2120   amLODipine (NORVASC) tablet 10 mg  10 mg Oral Daily Laverda Page B, NP   10 mg at 06/11/22 1610   aspirin EC tablet 81 mg  81 mg Oral Daily Su Hilt,  Ernest Haber, NP   81 mg at 06/11/22 9147   atorvastatin (LIPITOR) tablet 80 mg  80 mg Oral Daily Laverda Page B, NP   80 mg at 06/11/22 8295   carvedilol (COREG) tablet 12.5 mg  12.5 mg Oral BID Laverda Page B, NP   12.5 mg at 06/11/22 6213   ceFAZolin (ANCEF) IVPB 2g/100 mL premix  2 g Intravenous Q8H Kathlynn Grate, DO 200 mL/hr at 06/11/22 0865 2 g at 06/11/22 7846   diclofenac Sodium (VOLTAREN) 1 % topical gel 2 g  2 g Topical QID PRN Aundra Dubin, MD   2 g at 06/10/22 9629   docusate sodium (COLACE) capsule 100 mg  100 mg Oral BID Hughie Closs, MD   100 mg at 06/11/22 0824   enoxaparin (LOVENOX) injection 40 mg  40 mg Subcutaneous Q24H Runell Gess, MD   40 mg at 06/11/22 5284   insulin aspart (novoLOG) injection 0-20 Units  0-20 Units Subcutaneous TID WC Laverda Page B, NP   7 Units at 06/11/22 1211   insulin aspart (novoLOG) injection 0-5 Units  0-5 Units Subcutaneous QHS Laverda Page B, NP   2 Units at 06/10/22 2304   insulin aspart (novoLOG) injection 3 Units  3 Units Subcutaneous TID WC Laverda Page B, NP   3 Units at 06/11/22 0830   insulin glargine-yfgn (SEMGLEE) injection 75 Units  75 Units Subcutaneous BID Hughie Closs, MD   75 Units at 06/11/22 0834   melatonin tablet 3 mg  3 mg Oral QHS Aundra Dubin, MD   3 mg at 06/10/22 2121   nitroGLYCERIN (NITROSTAT) SL tablet 0.4 mg  0.4 mg Sublingual Q5 Min x 3 PRN Arty Baumgartner, NP       ondansetron  [facility] injection 4 mg  4 mg Intravenous Q6H PRN Laverda Page B, NP       oxyCODONE (Oxy IR/ROXICODONE) immediate release tablet 10 mg  10 mg Oral Q4H PRN Hughie Closs, MD   10 mg at 06/11/22 1113   polyethylene glycol (MIRALAX / GLYCOLAX) packet 17 g  17 g Oral Daily Hughie Closs, MD   17 g at 06/11/22 1324   sodium chloride flush (NS) 0.9 % injection 3 mL  3 mL Intravenous Q12H Laverda Page B, NP   3 mL at 06/10/22 2123   sodium chloride flush (NS) 0.9 % injection 3 mL  3 mL Intravenous PRN Arty Baumgartner, NP       Facility-Administered Medications Ordered in Other Encounters  Medication Dose Route Frequency Provider Last Rate Last Admin   regadenoson (LEXISCAN) injection SOLN 0.4 mg  0.4 mg Intravenous Once Chilton Si, MD       technetium tetrofosmin (TC-MYOVIEW) injection 32.8 millicurie  32.8 millicurie Intravenous Once PRN Chilton Si, MD         Family History  Problem Relation Age of Onset   CAD Other    Heart attack Other    Diabetes Other    Cataracts Mother    Amblyopia Neg Hx    Blindness Neg Hx    Glaucoma Neg Hx    Macular degeneration Neg Hx    Retinal detachment Neg Hx    Strabismus Neg Hx    Retinitis pigmentosa Neg Hx      Review of Systems:     Cardiac Review of Systems: Y or N  Chest Pain [  x  ]  Resting SOB [   ] Exertional SOB  [  ]  Orthopnea [  ]   Pedal Edema [   ]    Palpitations [  ] Syncope  [  ]   Presyncope [   ]  General Review of Systems: [Y] = yes [  ]=no Constitional: recent weight change [  ]; anorexia [  ]; fatigue [  ]; nausea [  ]; night sweats [  ]; fever [x  ]; or chills [  ];                                                                                                                                          Dental: poor dentition[  ]; Last Dentist visit: 1 year  Eye : blurred vision [  ]; diplopia [   ]; vision changes [  ];  Amaurosis fugax[  ]; Resp: cough [  ];  wheezing[  ];  hemoptysis[  ]; shortness  of breath[  ]; paroxysmal nocturnal dyspnea[  ]; dyspnea on exertion[  ]; or orthopnea[  ];  GI:  gallstones[  ], vomiting[  ];  dysphagia[  ]; melena[  ];  hematochezia [  ]; heartburn[  ];   Hx of  Colonoscopy[  ]; GU: kidney stones [  ]; hematuria[  ];   dysuria [  ];  nocturia[  ];  history of     obstruction [  ];                 Skin: rash, swelling[  ];, hair loss[  ];  peripheral edema[  ];  or itching[  ]; Musculosketetal: myalgias[  ];  joint swelling[  ];  joint erythema[  ];  joint pain[  x];  back pain[  ];  Heme/Lymph: bruising[  ];  bleeding[  ];  anemia[  ];  Neuro: TIA[  ];  headaches[  ];  stroke[  ];  vertigo[  ];  seizures[  ];   paresthesias[  ];  difficulty walking[  ];  Psych:depression[  ]; anxiety[  ];  Endocrine: diabetes[ x ];  thyroid dysfunction[  ];  Immunizations: Flu [  ]; Pneumococcal[  ];  Other:  Physical Exam: BP (!) 154/64 (BP Location: Right Wrist)   Pulse 80   Temp 97.6 F (36.4 C) (Oral)   Resp 14   Ht 6\' 4"  (1.93 m)   Wt 106.3 kg   SpO2 94%   BMI 28.53 kg/m        Exam    General- alert and comfortable    Neck- no JVD, no cervical adenopathy palpable, no carotid bruit.  No erythema edema or deformity over the left sternoclavicular joint but it is moderately tender.  No palpable fluctuance or fluid.  Well-healed sternal incision.   Lungs- clear without rales, wheezes   Cor- regular rate and rhythm, no murmur , gallop   Abdomen- soft, non-tender   Extremities - warm,  non-tender, minimal edema   Neuro- oriented, appropriate, no focal weakness    Diagnostic Studies & Laboratory data:     Recent Radiology Findings:   MR STERNUM W WO CONTRAST  Result Date: 06/11/2022 CLINICAL DATA:  MSSA bacteremia with pain and tenderness over the left sternoclavicular joint. Possible C4 discitis. EXAM: MR STERNUM WITHOUT AND WITH CONTRAST TECHNIQUE: Multiplanar imaging of the anterior chest was performed with attention to the sternal and sternoclavicular  joint. Study includes imaging following intravenous administration of contrast. CONTRAST:  10mL GADAVIST GADOBUTROL 1 MMOL/ML IV SOLN COMPARISON:  Chest CT 06/05/2022. FINDINGS: Bones/Joint/Cartilage Status post median sternotomy with associated susceptibility artifact. Allowing for this artifact and correlated with the recent CT, the sternotomy appears healed. There is no suspicious marrow signal abnormality or enhancement within the lower sternum. However, there are inflammatory changes within the soft tissue surrounding the left sternoclavicular joint which have progressed from the recent CT. There is low-level marrow edema within the left aspect of the sternal manubrium and the inferior aspect of the left clavicular head. No cortical destruction identified. There is moderate irregular synovial thickening at the left sternoclavicular joint without drainable fluid collection. The right sternoclavicular joint appears unremarkable. Ligaments No ligamentous abnormalities are identified. Muscles and Tendons Mild edema superior medially in the left pectoralis major muscle without focal fluid collection or atrophy. Soft tissue As above, there is inflammation in the soft tissues surrounding the left sternoclavicular joint associated with probable underlying synovitis. These findings appear mildly progressive compared with the recent chest CT. The inflammatory changes extend superiorly, but do not exert any significant mass effect on the mediastinal structures. There is no focal drainable fluid collection. IMPRESSION: 1. Progressive inflammatory changes surrounding the left sternoclavicular joint compared with recent chest CT. There is low-level marrow edema within the left aspect of the sternal manubrium and the inferior aspect of the left clavicular head without cortical destruction or drainable fluid collection. These findings are suspicious for septic arthritis and early osteomyelitis in this clinical setting.  Differential includes other inflammatory and crystalline arthropathies. 2. The right sternoclavicular joint appears unremarkable. 3. Status post median sternotomy. Electronically Signed   By: Carey Bullocks M.D.   On: 06/11/2022 08:25      Recent Lab Findings: Lab Results  Component Value Date   WBC 7.0 06/09/2022   HGB 10.9 (L) 06/09/2022   HCT 33.2 (L) 06/09/2022   PLT 166 06/09/2022   GLUCOSE 179 (H) 06/11/2022   CHOL 144 06/06/2022   TRIG 285 (H) 06/06/2022   HDL 37 (L) 06/06/2022   LDLCALC 50 06/06/2022   ALT 35 08/11/2018   AST 29 08/11/2018   NA 134 (L) 06/11/2022   K 3.3 (L) 06/11/2022   CL 96 (L) 06/11/2022   CREATININE 0.72 06/11/2022   BUN 14 06/11/2022   CO2 29 06/11/2022   TSH 2.92 04/23/2017   INR 0.97 11/18/2017   HGBA1C 8.6 (H) 06/06/2022      Assessment / Plan:   MSSA bacteremia with inflammation/infection of the left sternoclavicular joint.  By imaging and physical exam there is no evidence of abscess or fluid collection which would need drainage.  The patient's symptoms have improved on . IV antibiotics.  The left [place] joint process should resolve with long-term IV antibiotics as recommended by ID.  I will follow the patient in the hospital and follow-up as an outpatient.  If his symptoms related to the left [place] joint resolve with antibiotics on a follow-up chest CT scan would  not be needed.

## 2022-06-11 NOTE — Progress Notes (Addendum)
PROGRESS NOTE    George [name]  ZOX:[id number] DOB: [birth year] DOA: 06/05/2022 PCP: Kirstie Peri, MD   Brief Narrative:  George [name] is a 77 y.o. male with past medical history of AAA, chronic pain, CAD s/p CABG, HTN, HLD, DM, chronic systolic CHF, and prostate CA who presented with chest pain.  Recent stress test on 05/08/22 showed prior infarction and was intermediate risk with EF 30-44%.  He was admitted under cardiology with NSTEMI with plan for cardiac catheterization.  he spiked a fever to 102.  His only complaint is L shoulder pain that radiates into his L neck and jaw and into his L chest.  Hospitalist were consulted for that.  Eventually Ortho and ID were consulted.  Assessment & Plan:   Principal Problem:   MSSA bacteremia Active Problems:   HTN (hypertension)   Uncontrolled type 2 diabetes mellitus with hyperglycemia (HCC)   Mixed hyperlipidemia   NSTEMI (non-ST elevated myocardial infarction) (HCC)   Sepsis due to methicillin susceptible Staphylococcus aureus (MSSA) without acute organ dysfunction (HCC)   Acute pain of left shoulder  NSTEMI (non-ST elevated myocardial infarction) (HCC) ruled out -Patient with prior h/o CAD s/p CABG presenting with chest pain and uptrending troponin, initially thought to be consistent with NSTEMI.  he was started on heparin drip and plan was cardiac cath but then it was opined by cardiology that there is very low concern of ACS so heparin was discontinued and no plan for cardiac cath and cardiology signed off on 06/07/2022.   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  Complains of neck pain and left shoulder pain.  Ortho was consulted.  They did arthrocentesis of the left shoulder but it was a dry tap.  MRI spine shows some concern of possible C4 discitis but no infection in the left shoulder.  MRI left shoulder shows multiple torn ligaments.  Orthopedics to manage that.  May need repeat MRI of the  cervical spine in few weeks.  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  ID has recommended cardiothoracic surgery consultation, I have consulted them.  ID has now also recommended TEE.  Cardiology is consulted and patient is scheduled for TEE Thursday.  He is on cefazolin and ID is managing.    Mixed hyperlipidemia -Continue high-dose Lipitor   Uncontrolled type 2 diabetes mellitus with hyperglycemia (HCC) Hemoglobin A1c 8.6.  Appears to be taking 70 units of long-acting insulin at night however he is on 70 units Semglee twice daily and is still slightly hyperglycemic, will increase to 75 units twice daily and continue SSI.    HTN (hypertension) -Blood pressure slightly elevated today.  Continue amlodipine and carvedilol -He was also on Toprol XL and Catapres at home but these are on hold currently.    Hypokalemia: Low again.  Will replace.  Generalized weakness/deconditioning: Patient complains of weakness.  Per wife, he cannot have an get up.  He has been seen by PT OT and they have recommended home health.  I have serious doubts that this patient will not do well at home and he qualifies for SNF.  Will convey my concerns to the PD.  Family dynamics: Patient's granddaughter tends to be at bedside for most part.  Reportedly, she has been disrespectful to the staff.  On my daily rounds, I explained my plan of care for the day, but she tends to focus on her phone.  She would ask me question, the answer  of which I had just explained in my plan.  When I would answer her question again, she would start calling someone and talking on the phone.  Patient's wife informed us patient's granddaughter is not her granddaughter and she also does not like her to be present every time.  She asked me and the staff that she should be the one who we should be communicating with regarding plan of care  DVT prophylaxis: enoxaparin (LOVENOX) injection 40 mg Start:  06/08/22 1000Heparin drip   Code Status: Full Code  Family Communication: granddaughter and wife present at bedside.  Plan of care discussed with patient in length and he/she verbalized understanding and agreed with it.  Status is: Inpatient Remains inpatient appropriate because: Needs evaluation by CT surgery and scheduled for TEE Thursday, 06/13/2022.   Estimated body mass index is 28.53 kg/m as calculated from the following:   Height as of this encounter: 6\' 4"  (1.93 m).   Weight as of this encounter: 106.3 kg.    Nutritional Assessment: Body mass index is 28.53 kg/m.Marland Kitchen Seen by dietician.  I agree with the assessment and plan as outlined below: Nutrition Status:        . Skin Assessment: I have examined the patient's skin and I agree with the wound assessment as performed by the wound care RN as outlined below:    Consultants:  ID and orthopedics Cardiology-signed off  Procedures:  As above  Antimicrobials:  Anti-infectives (From admission, onward)    Start     Dose/Rate Route Frequency Ordered Stop   06/06/22 1430  ceFAZolin (ANCEF) IVPB 2g/100 mL premix        2 g 200 mL/hr over 30 Minutes Intravenous Every 8 hours 06/06/22 1339     06/06/22 1400  doxycycline (VIBRA-TABS) tablet 100 mg  Status:  Discontinued        100 mg Oral Every 12 hours 06/06/22 1222 06/06/22 1449   06/06/22 1300  vancomycin (VANCOREADY) IVPB 1250 mg/250 mL  Status:  Discontinued        1,250 mg 166.7 mL/hr over 90 Minutes Intravenous Every 12 hours 06/05/22 2247 06/06/22 1339   06/06/22 0600  piperacillin-tazobactam (ZOSYN) IVPB 3.375 g  Status:  Discontinued        3.375 g 12.5 mL/hr over 240 Minutes Intravenous Every 8 hours 06/05/22 2247 06/06/22 1339   06/05/22 2345  vancomycin (VANCOREADY) IVPB 2000 mg/400 mL        2,000 mg 200 mL/hr over 120 Minutes Intravenous  Once 06/05/22 2247 06/06/22 0820   06/05/22 2345  piperacillin-tazobactam (ZOSYN) IVPB 3.375 g        3.375 g 100 mL/hr  over 30 Minutes Intravenous  Once 06/05/22 2247 06/06/22 1004         Subjective: Patient seen and examined.  Granddaughter was not at the bedside today.  His wife was at the bedside.  Patient complains of left shoulder pain and neck pain with no improvement.  Otherwise he is alert and oriented.  Objective: Vitals:   06/11/22 0013 06/11/22 0420 06/11/22 0815 06/11/22 0820  BP: (!) 148/78 (!) 156/69 (!) 175/86 (!) 175/86  Pulse: 81 74 83 81  Resp: 17 13 17 12   Temp: 98 F (36.7 C) 98.5 F (36.9 C) 98.3 F (36.8 C) 98.3 F (36.8 C)  TempSrc: Oral Oral Oral Oral  SpO2: 95% 92% 91% 94%  Weight:  106.3 kg    Height:        Intake/Output Summary (Last 24  hours) at 06/11/2022 0950 Last data filed at 06/11/2022 0454 Gross per 24 hour  Intake 600 ml  Output 1100 ml  Net -500 ml    Filed Weights   06/09/22 0404 06/10/22 0349 06/11/22 0420  Weight: 103.4 kg 104.9 kg 106.3 kg    Examination:  General exam: Appears calm and comfortable  Respiratory system: Clear to auscultation. Respiratory effort normal. Cardiovascular system: S1 & S2 heard, RRR. No JVD, murmurs, rubs, gallops or clicks. No pedal edema. Gastrointestinal system: Abdomen is nondistended, soft and nontender. No organomegaly or masses felt. Normal bowel sounds heard. Central nervous system: Alert and oriented. No focal neurological deficits. Extremities: Cervical neck tenderness.  Limited range of motion of the left shoulder due to pain. Psychiatry: Judgement and insight appear normal. Mood & affect appropriate.   Data Reviewed: I have personally reviewed following labs and imaging studies  CBC: Recent Labs  Lab 06/05/22 2226 06/06/22 0643 06/07/22 0718 06/08/22 0042 06/09/22 0101  WBC 13.0* 12.2* 9.0 6.6 7.0  NEUTROABS 11.3*  --   --   --   --   HGB 12.9* 12.6* 11.9* 11.2* 10.9*  HCT 38.7* 37.2* 36.1* 33.3* 33.2*  MCV 81.0 79.7* 81.5 81.8 81.2  PLT 166 161 161 158 166    Basic Metabolic  Panel: Recent Labs  Lab 06/07/22 0659 06/08/22 0042 06/09/22 0101 06/10/22 0058 06/11/22 0031  NA 130* 131* 132* 131* 134*  K 3.6 3.3* 3.8 3.6 3.3*  CL 95* 96* 98 95* 96*  CO2 23 26 25 25 29   GLUCOSE 331* 263* 193* 306* 179*  BUN 24* 28* 22 22 14   CREATININE 0.95 1.05 0.75 0.80 0.72  CALCIUM 8.6* 8.3* 8.1* 8.1* 8.1*    GFR: Estimated Creatinine Clearance: 103.5 mL/min (by C-G formula based on SCr of 0.72 mg/dL). Liver Function Tests: No results for input(s): "AST", "ALT", "ALKPHOS", "BILITOT", "PROT", "ALBUMIN" in the last 168 hours. No results for input(s): "LIPASE", "AMYLASE" in the last 168 hours. No results for input(s): "AMMONIA" in the last 168 hours. Coagulation Profile: No results for input(s): "INR", "PROTIME" in the last 168 hours. Cardiac Enzymes: No results for input(s): "CKTOTAL", "CKMB", "CKMBINDEX", "TROPONINI" in the last 168 hours. BNP (last 3 results) No results for input(s): "PROBNP" in the last 8760 hours. HbA1C: No results for input(s): "HGBA1C" in the last 72 hours.  CBG: Recent Labs  Lab 06/10/22 0558 06/10/22 1102 06/10/22 1613 06/10/22 2056 06/11/22 0605  GLUCAP 240* 189* 170* 226* 148*    Lipid Profile: No results for input(s): "CHOL", "HDL", "LDLCALC", "TRIG", "CHOLHDL", "LDLDIRECT" in the last 72 hours.  Thyroid Function Tests: No results for input(s): "TSH", "T4TOTAL", "FREET4", "T3FREE", "THYROIDAB" in the last 72 hours. Anemia Panel: No results for input(s): "VITAMINB12", "FOLATE", "FERRITIN", "TIBC", "IRON", "RETICCTPCT" in the last 72 hours. Sepsis Labs: No results for input(s): "PROCALCITON", "LATICACIDVEN" in the last 168 hours.  Recent Results (from the past 240 hour(s))  Culture, blood (Routine X 2) w Reflex to ID Panel     Status: Abnormal   Collection Time: 06/05/22 10:26 PM   Specimen: BLOOD LEFT HAND  Result Value Ref Range Status   Specimen Description BLOOD LEFT HAND  Final   Special Requests NONE  Final   Culture   Setup Time   Final    GRAM POSITIVE COCCI IN CLUSTERS IN BOTH AEROBIC AND ANAEROBIC BOTTLES CRITICAL VALUE NOTED.  VALUE IS CONSISTENT WITH PREVIOUSLY REPORTED AND CALLED VALUE.    Culture (A)  Final  STAPHYLOCOCCUS AUREUS SUSCEPTIBILITIES PERFORMED ON PREVIOUS CULTURE WITHIN THE LAST 5 DAYS. Performed at [facility], [address] [place], [place] [address]    Report Status 06/08/2022 FINAL  Final  Culture, blood (Routine X 2) w Reflex to ID Panel     Status: Abnormal   Collection Time: 06/05/22 10:26 PM   Specimen: BLOOD RIGHT HAND  Result Value Ref Range Status   Specimen Description BLOOD RIGHT HAND  Final   Special Requests   Final    BOTTLES DRAWN AEROBIC AND ANAEROBIC Blood Culture adequate volume   Culture  Setup Time   Final    GRAM POSITIVE COCCI IN CLUSTERS IN BOTH AEROBIC AND ANAEROBIC BOTTLES CRITICAL RESULT CALLED TO, READ BACK BY AND VERIFIED WITH: PHARMD A PAYTES [id number] AT 1325 BY CM Performed at [facility], [address] [place], [place] [address]    Culture STAPHYLOCOCCUS AUREUS (A)  Final   Report Status 06/08/2022 FINAL  Final   Organism ID, Bacteria STAPHYLOCOCCUS AUREUS  Final      Susceptibility   Staphylococcus aureus - MIC*    CIPROFLOXACIN <=0.5 SENSITIVE Sensitive     ERYTHROMYCIN <=0.25 SENSITIVE Sensitive     GENTAMICIN <=0.5 SENSITIVE Sensitive     OXACILLIN 0.5 SENSITIVE Sensitive     TETRACYCLINE <=1 SENSITIVE Sensitive     VANCOMYCIN <=0.5 SENSITIVE Sensitive     TRIMETH/SULFA <=10 SENSITIVE Sensitive     CLINDAMYCIN <=0.25 SENSITIVE Sensitive     RIFAMPIN <=0.5 SENSITIVE Sensitive     Inducible Clindamycin NEGATIVE Sensitive     LINEZOLID 2 SENSITIVE Sensitive     * STAPHYLOCOCCUS AUREUS  Blood Culture ID Panel (Reflexed)     Status: Abnormal   Collection Time: 06/05/22 10:26 PM  Result Value Ref Range Status   Enterococcus faecalis NOT DETECTED NOT DETECTED Final   Enterococcus Faecium NOT DETECTED NOT DETECTED  Final   Listeria monocytogenes NOT DETECTED NOT DETECTED Final   Staphylococcus species DETECTED (A) NOT DETECTED Final    Comment: CRITICAL RESULT CALLED TO, READ BACK BY AND VERIFIED WITH: PHARMD A PAYTES [id number] AT 1325 BY CM    Staphylococcus aureus (BCID) DETECTED (A) NOT DETECTED Final    Comment: CRITICAL RESULT CALLED TO, READ BACK BY AND VERIFIED WITH: PHARMD A PAYTES [id number] AT 1325 BY CM    Staphylococcus epidermidis NOT DETECTED NOT DETECTED Final   Staphylococcus lugdunensis NOT DETECTED NOT DETECTED Final   Streptococcus species NOT DETECTED NOT DETECTED Final   Streptococcus agalactiae NOT DETECTED NOT DETECTED Final   Streptococcus pneumoniae NOT DETECTED NOT DETECTED Final   Streptococcus pyogenes NOT DETECTED NOT DETECTED Final   A.calcoaceticus-baumannii NOT DETECTED NOT DETECTED Final   Bacteroides fragilis NOT DETECTED NOT DETECTED Final   Enterobacterales NOT DETECTED NOT DETECTED Final   Enterobacter cloacae complex NOT DETECTED NOT DETECTED Final   Escherichia coli NOT DETECTED NOT DETECTED Final   Klebsiella aerogenes NOT DETECTED NOT DETECTED Final   Klebsiella oxytoca NOT DETECTED NOT DETECTED Final   Klebsiella pneumoniae NOT DETECTED NOT DETECTED Final   Proteus species NOT DETECTED NOT DETECTED Final   Salmonella species NOT DETECTED NOT DETECTED Final   Serratia marcescens NOT DETECTED NOT DETECTED Final   Haemophilus influenzae NOT DETECTED NOT DETECTED Final   Neisseria meningitidis NOT DETECTED NOT DETECTED Final   Pseudomonas aeruginosa NOT DETECTED NOT DETECTED Final   Stenotrophomonas maltophilia NOT DETECTED NOT DETECTED Final   Candida albicans NOT DETECTED NOT DETECTED Final  Candida auris NOT DETECTED NOT DETECTED Final   Candida glabrata NOT DETECTED NOT DETECTED Final   Candida krusei NOT DETECTED NOT DETECTED Final   Candida parapsilosis NOT DETECTED NOT DETECTED Final   Candida tropicalis NOT DETECTED NOT DETECTED Final   Cryptococcus  neoformans/gattii NOT DETECTED NOT DETECTED Final   Meth resistant mecA/C and MREJ NOT DETECTED NOT DETECTED Final    Comment: Performed at [facility], [address] [place], [place] [address]  Culture, blood (Routine X 2) w Reflex to ID Panel     Status: None (Preliminary result)   Collection Time: 06/07/22  6:58 AM   Specimen: BLOOD RIGHT HAND  Result Value Ref Range Status   Specimen Description BLOOD RIGHT HAND  Final   Special Requests   Final    BOTTLES DRAWN AEROBIC AND ANAEROBIC Blood Culture adequate volume   Culture   Final    NO GROWTH 4 DAYS Performed at [facility], [address], [place], [place] [address]    Report Status PENDING  Incomplete  Culture, blood (Routine X 2) w Reflex to ID Panel     Status: None (Preliminary result)   Collection Time: 06/07/22  7:11 AM   Specimen: BLOOD LEFT HAND  Result Value Ref Range Status   Specimen Description BLOOD LEFT HAND  Final   Special Requests   Final    BOTTLES DRAWN AEROBIC AND ANAEROBIC Blood Culture adequate volume   Culture   Final    NO GROWTH 4 DAYS Performed at [facility], [address] [place], [place] [address]    Report Status PENDING  Incomplete     Radiology Studies: MR STERNUM W WO CONTRAST  Result Date: 06/11/2022 CLINICAL DATA:  MSSA bacteremia with pain and tenderness over the left sternoclavicular joint. Possible C4 discitis. EXAM: MR STERNUM WITHOUT AND WITH CONTRAST TECHNIQUE: Multiplanar imaging of the anterior chest was performed with attention to the sternal and sternoclavicular joint. Study includes imaging following intravenous administration of contrast. CONTRAST:  10mL GADAVIST GADOBUTROL 1 MMOL/ML IV SOLN COMPARISON:  Chest CT 06/05/2022. FINDINGS: Bones/Joint/Cartilage Status post median sternotomy with associated susceptibility artifact. Allowing for this artifact and correlated with the recent CT, the sternotomy appears healed. There is no suspicious marrow signal  abnormality or enhancement within the lower sternum. However, there are inflammatory changes within the soft tissue surrounding the left sternoclavicular joint which have progressed from the recent CT. There is low-level marrow edema within the left aspect of the sternal manubrium and the inferior aspect of the left clavicular head. No cortical destruction identified. There is moderate irregular synovial thickening at the left sternoclavicular joint without drainable fluid collection. The right sternoclavicular joint appears unremarkable. Ligaments No ligamentous abnormalities are identified. Muscles and Tendons Mild edema superior medially in the left pectoralis major muscle without focal fluid collection or atrophy. Soft tissue As above, there is inflammation in the soft tissues surrounding the left sternoclavicular joint associated with probable underlying synovitis. These findings appear mildly progressive compared with the recent chest CT. The inflammatory changes extend superiorly, but do not exert any significant mass effect on the mediastinal structures. There is no focal drainable fluid collection. IMPRESSION: 1. Progressive inflammatory changes surrounding the left sternoclavicular joint compared with recent chest CT. There is low-level marrow edema within the left aspect of the sternal manubrium and the inferior aspect of the left clavicular head without cortical destruction or drainable fluid collection. These findings are suspicious for septic arthritis  and early osteomyelitis in this clinical setting. Differential includes other inflammatory and crystalline arthropathies. 2. The right sternoclavicular joint appears unremarkable. 3. Status post median sternotomy. Electronically Signed   By: Carey Bullocks M.D.   On: 06/11/2022 08:25    Scheduled Meds:  amLODipine  10 mg Oral Daily   aspirin EC  81 mg Oral Daily   atorvastatin  80 mg Oral Daily   carvedilol  12.5 mg Oral BID   docusate sodium   100 mg Oral BID   enoxaparin (LOVENOX) injection  40 mg Subcutaneous Q24H   insulin aspart  0-20 Units Subcutaneous TID WC   insulin aspart  0-5 Units Subcutaneous QHS   insulin aspart  3 Units Subcutaneous TID WC   insulin glargine-yfgn  75 Units Subcutaneous BID   melatonin  3 mg Oral QHS   polyethylene glycol  17 g Oral Daily   potassium chloride  40 mEq Oral Q4H   sodium chloride flush  3 mL Intravenous Q12H   Continuous Infusions:  sodium chloride     sodium chloride 1 mL/kg/hr (06/06/22 0420)    ceFAZolin (ANCEF) IV 2 g (06/11/22 0616)     LOS: 6 days   Hughie Closs, MD [facility]  06/11/2022, 9:50 AM   *Please note that this is a verbal dictation therefore any spelling or grammatical errors are due to the "[employer] One" system interpretation.  Please page via Amion and do not message via secure chat for urgent patient care matters. Secure chat can be used for non urgent patient care matters.  How to contact the [facility] Attending or Consulting provider 7A - 7P or covering provider during after hours 7P -7A, for this patient?  Check the care team in [facility] and look for a) attending/consulting [facility] provider listed and b) the [facility] team listed. Page or secure chat 7A-7P. Log into [email or web address] and use [place]'s universal password to access. If you do not have the password, please contact the hospital operator. Locate the [facility] provider you are looking for under [facility] and page to a number that you can be directly reached. If you still have difficulty reaching the provider, please page the [facility] (Director on Call) for the Hospitalists listed on amion for assistance.

## 2022-06-11 NOTE — Progress Notes (Signed)
    [facility] has been requested to perform a transesophageal echocardiogram on George [name] for evaluation of possible endocarditis with blood cultures positive for staphylococcus aureus bacteremia.  After careful review of history and examination, the risks and benefits of transesophageal echocardiogram have been explained including risks of esophageal damage, perforation (1:10,000 risk), bleeding, pharyngeal hematoma as well as other potential complications associated with conscious sedation including aspiration, arrhythmia, respiratory failure and death. Alternatives to treatment were discussed, questions were answered. Patient is willing to proceed.   Perlie Gold PA-C 06/11/2022 11:03 AM

## 2022-06-12 ENCOUNTER — Inpatient Hospital Stay (HOSPITAL_COMMUNITY): Payer: Medicare Other | Admitting: Registered Nurse

## 2022-06-12 ENCOUNTER — Other Ambulatory Visit: Payer: Self-pay | Admitting: Cardiothoracic Surgery

## 2022-06-12 ENCOUNTER — Encounter (HOSPITAL_COMMUNITY)
Disposition: A | Discharge status: Home Health | Payer: Self-pay | Source: Other Acute Inpatient Hospital | Attending: Family Medicine

## 2022-06-12 ENCOUNTER — Encounter (HOSPITAL_COMMUNITY): Payer: Self-pay | Admitting: Cardiovascular Disease

## 2022-06-12 ENCOUNTER — Other Ambulatory Visit: Payer: Self-pay

## 2022-06-12 ENCOUNTER — Inpatient Hospital Stay (HOSPITAL_COMMUNITY): Payer: Medicare Other

## 2022-06-12 DIAGNOSIS — Z87891 Personal history of nicotine dependence: Secondary | ICD-10-CM

## 2022-06-12 DIAGNOSIS — I340 Nonrheumatic mitral (valve) insufficiency: Secondary | ICD-10-CM | POA: Diagnosis not present

## 2022-06-12 DIAGNOSIS — A4101 Sepsis due to Methicillin susceptible Staphylococcus aureus: Secondary | ICD-10-CM | POA: Diagnosis not present

## 2022-06-12 DIAGNOSIS — I351 Nonrheumatic aortic (valve) insufficiency: Secondary | ICD-10-CM | POA: Diagnosis not present

## 2022-06-12 DIAGNOSIS — I2510 Atherosclerotic heart disease of native coronary artery without angina pectoris: Secondary | ICD-10-CM

## 2022-06-12 DIAGNOSIS — M25519 Pain in unspecified shoulder: Secondary | ICD-10-CM

## 2022-06-12 DIAGNOSIS — E119 Type 2 diabetes mellitus without complications: Secondary | ICD-10-CM | POA: Diagnosis not present

## 2022-06-12 DIAGNOSIS — M25512 Pain in left shoulder: Secondary | ICD-10-CM | POA: Diagnosis not present

## 2022-06-12 DIAGNOSIS — B9561 Methicillin susceptible Staphylococcus aureus infection as the cause of diseases classified elsewhere: Secondary | ICD-10-CM | POA: Diagnosis not present

## 2022-06-12 DIAGNOSIS — I10 Essential (primary) hypertension: Secondary | ICD-10-CM | POA: Diagnosis not present

## 2022-06-12 DIAGNOSIS — R7881 Bacteremia: Secondary | ICD-10-CM | POA: Diagnosis not present

## 2022-06-12 DIAGNOSIS — Z951 Presence of aortocoronary bypass graft: Secondary | ICD-10-CM

## 2022-06-12 HISTORY — PX: TEE WITHOUT CARDIOVERSION: SHX5443

## 2022-06-12 LAB — GLUCOSE, CAPILLARY
Glucose-Capillary: 140 mg/dL — ABNORMAL HIGH (ref 70–99)
Glucose-Capillary: 124 mg/dL — ABNORMAL HIGH (ref 70–99)
Glucose-Capillary: 128 mg/dL — ABNORMAL HIGH (ref 70–99)
Glucose-Capillary: 244 mg/dL — ABNORMAL HIGH (ref 70–99)
Glucose-Capillary: 204 mg/dL — ABNORMAL HIGH (ref 70–99)
Glucose-Capillary: 110 mg/dL — ABNORMAL HIGH (ref 70–99)
Glucose-Capillary: 91 mg/dL (ref 70–99)

## 2022-06-12 LAB — ECHO TEE

## 2022-06-12 LAB — CULTURE, BLOOD (ROUTINE X 2)
Special Requests: ADEQUATE
Special Requests: ADEQUATE

## 2022-06-12 SURGERY — ECHOCARDIOGRAM, TRANSESOPHAGEAL
Anesthesia: General

## 2022-06-12 MED ORDER — SODIUM CHLORIDE 0.9 % IV SOLN: 700.0000 mg | Freq: Once | INTRAVENOUS | Status: DC

## 2022-06-12 MED ORDER — LISINOPRIL 20 MG PO TABS
20.0000 mg | ORAL_TABLET | Freq: Every day | ORAL | Status: DC
  Administered 2022-06-12 – 2022-06-13 (×2): 20 mg via ORAL
  Filled 2022-06-12 (×2): qty 1

## 2022-06-12 MED ORDER — METHYLPREDNISOLONE SODIUM SUCC 125 MG IJ SOLR: 125.0000 mg | Freq: Once | INTRAMUSCULAR | Status: DC | PRN

## 2022-06-12 MED ORDER — PHENYLEPHRINE HCL-NACL 20-0.9 MG/250ML-% IV SOLN
INTRAVENOUS | Status: DC | PRN
  Administered 2022-06-12 (×4): 160 ug via INTRAVENOUS

## 2022-06-12 MED ORDER — CHLORHEXIDINE GLUCONATE CLOTH 2 % EX PADS
6.0000 | MEDICATED_PAD | Freq: Every day | CUTANEOUS | Status: DC
  Administered 2022-06-13: 6 via TOPICAL

## 2022-06-12 MED ORDER — ALBUTEROL SULFATE HFA 108 (90 BASE) MCG/ACT IN AERS: 2.0000 | INHALATION_SPRAY | Freq: Once | RESPIRATORY_TRACT | Status: DC | PRN

## 2022-06-12 MED ORDER — HYDROCORTISONE 1 % EX CREA
1.0000 | TOPICAL_CREAM | Freq: Three times a day (TID) | CUTANEOUS | Status: DC | PRN
  Filled 2022-06-12: qty 28

## 2022-06-12 MED ORDER — LIDOCAINE 2% (20 MG/ML) 5 ML SYRINGE
INTRAMUSCULAR | Status: DC | PRN
  Administered 2022-06-12: 100 mg via INTRAVENOUS

## 2022-06-12 MED ORDER — BUTAMBEN-TETRACAINE-BENZOCAINE 2-2-14 % EX AERO
INHALATION_SPRAY | CUTANEOUS | Status: AC
  Filled 2022-06-12: qty 20

## 2022-06-12 MED ORDER — SODIUM CHLORIDE 0.9% FLUSH: 10.0000 mL | INTRAVENOUS | Status: DC | PRN

## 2022-06-12 MED ORDER — DIPHENHYDRAMINE HCL 50 MG/ML IJ SOLN: 50.0000 mg | Freq: Once | INTRAMUSCULAR | Status: DC | PRN

## 2022-06-12 MED ORDER — PROPOFOL 500 MG/50ML IV EMUL
INTRAVENOUS | Status: DC | PRN
  Administered 2022-06-12: 120 ug/kg/min via INTRAVENOUS

## 2022-06-12 MED ORDER — SODIUM CHLORIDE 0.9% FLUSH
10.0000 mL | Freq: Two times a day (BID) | INTRAVENOUS | Status: DC
  Administered 2022-06-12 – 2022-06-13 (×2): 10 mL

## 2022-06-12 MED ORDER — BUTAMBEN-TETRACAINE-BENZOCAINE 2-2-14 % EX AERO
INHALATION_SPRAY | CUTANEOUS | Status: DC | PRN
  Administered 2022-06-12: 1 via TOPICAL

## 2022-06-12 MED ORDER — EPINEPHRINE 0.3 MG/0.3ML IJ SOAJ: 0.3000 mg | Freq: Once | INTRAMUSCULAR | Status: DC | PRN

## 2022-06-12 MED ORDER — FAMOTIDINE IN NACL 20-0.9 MG/50ML-% IV SOLN: 20.0000 mg | Freq: Once | INTRAVENOUS | Status: DC | PRN

## 2022-06-12 MED ORDER — SODIUM CHLORIDE 0.9 % IV SOLN: INTRAVENOUS | Status: DC | PRN

## 2022-06-12 MED ORDER — PROPOFOL 10 MG/ML IV BOLUS
INTRAVENOUS | Status: DC | PRN
  Administered 2022-06-12: 50 mg via INTRAVENOUS

## 2022-06-12 MED ORDER — ATORVASTATIN CALCIUM 40 MG PO TABS: 40.0000 mg | ORAL_TABLET | Freq: Every day | ORAL | Status: DC

## 2022-06-12 NOTE — [place] FL2 (Signed)
[place] MEDICAID FL2 LEVEL OF CARE FORM     IDENTIFICATION  Patient Name: George [name] Birthdate: [birth year] Sex: male Admission Date (Current Location): 06/05/2022  [facility] and [place] Number:  Producer, television/film/video and Address:  [facility], 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   MSSA bacteremia 06/06/2022   Sepsis due to methicillin susceptible Staphylococcus aureus (MSSA) without acute organ dysfunction (HCC) 06/06/2022   Acute pain of left shoulder 06/06/2022   NSTEMI (non-ST elevated myocardial infarction) (HCC) 06/05/2022   Malignant neoplasm of prostate (HCC) 05/01/2017   Uncontrolled type 2 diabetes mellitus with hyperglycemia (HCC) 02/26/2017   Mixed hyperlipidemia 02/26/2017   Essential hypertension, benign 02/26/2017   Thoracic ascending aortic aneurysm (HCC) 01/02/2017   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   CAD (coronary artery disease) of artery bypass graft 10/26/2012   Chest pain 08/05/2012   Abnormal stress test 08/05/2012   HTN (hypertension) 08/05/2012    Orientation RESPIRATION BLADDER Height & Weight     Self, Time, Place, Situation  Normal Continent Weight: 230 lb 9.6 oz (104.6 kg) Height:  6\' 4"  (193 cm)  BEHAVIORAL SYMPTOMS/MOOD NEUROLOGICAL BOWEL NUTRITION STATUS      Continent Diet  AMBULATORY STATUS COMMUNICATION OF NEEDS Skin   Limited Assist   Normal                       Personal Care Assistance Level of Assistance  Bathing, Feeding, Dressing Bathing Assistance: Limited  assistance Feeding assistance: Independent Dressing Assistance: Limited assistance     Functional Limitations Info  Sight, Hearing, Speech Sight Info: Adequate Hearing Info: Impaired Speech Info: Adequate    SPECIAL CARE FACTORS FREQUENCY  PT (By licensed PT), OT (By licensed OT)     PT Frequency: 5xweek OT Frequency: 5xweek            Contractures Contractures Info: Not present    Additional Factors Info  Code Status, Allergies Code Status Info: Full Allergies Info: NKA           Current Medications (06/12/2022):  This is the current hospital active medication list Current Facility-Administered Medications  Medication Dose Route Frequency Provider Last Rate Last Admin   acetaminophen (TYLENOL) tablet 650 mg  650 mg Oral Q4H PRN Chilton Si, MD   650 mg at 06/10/22 2120   amLODipine (NORVASC) tablet 10 mg  10 mg Oral Daily Chilton Si, MD   10 mg at 06/12/22 1001   aspirin EC tablet 81 mg  81 mg Oral Daily Chilton Si, MD   81 mg at 06/12/22 1000   atorvastatin (LIPITOR) tablet 80 mg  80 mg Oral Daily Chilton Si, MD   80 mg at 06/12/22 1000   butamben-tetracaine-benzocaine (CETACAINE) 02-16-12 % spray            carvedilol (COREG) tablet 12.5 mg  12.5 mg Oral BID Chilton Si, MD   12.5 mg at 06/12/22 1000   ceFAZolin (ANCEF) IVPB 2g/100 mL premix  2 g  Intravenous Q8H Chilton Si, MD 200 mL/hr at 06/12/22 1346 2 g at 06/12/22 1346   diclofenac Sodium (VOLTAREN) 1 % topical gel 2 g  2 g Topical QID PRN Chilton Si, MD   2 g at 06/12/22 1002   docusate sodium (COLACE) capsule 100 mg  100 mg Oral BID Chilton Si, MD   100 mg at 06/12/22 1001   enoxaparin (LOVENOX) injection 40 mg  40 mg Subcutaneous Q24H Chilton Si, MD   40 mg at 06/12/22 1001   insulin aspart (novoLOG) injection 0-20 Units  0-20 Units Subcutaneous TID WC Chilton Si, MD   7 Units at 06/12/22 1200   insulin aspart (novoLOG) injection 0-5 Units  0-5  Units Subcutaneous QHS Chilton Si, MD   2 Units at 06/10/22 2304   insulin aspart (novoLOG) injection 3 Units  3 Units Subcutaneous TID WC Chilton Si, MD   3 Units at 06/12/22 1201   insulin glargine-yfgn (SEMGLEE) injection 75 Units  75 Units Subcutaneous BID Chilton Si, MD   75 Units at 06/12/22 1002   lisinopril (ZESTRIL) tablet 20 mg  20 mg Oral Daily Pahwani, Daleen Bo, MD   20 mg at 06/12/22 1343   melatonin tablet 3 mg  3 mg Oral QHS Chilton Si, MD   3 mg at 06/11/22 2237   nitroGLYCERIN (NITROSTAT) SL tablet 0.4 mg  0.4 mg Sublingual Q5 Min x 3 PRN Chilton Si, MD       ondansetron [facility]) injection 4 mg  4 mg Intravenous Q6H PRN Chilton Si, MD       oxyCODONE (Oxy IR/ROXICODONE) immediate release tablet 10 mg  10 mg Oral Q4H PRN Chilton Si, MD   10 mg at 06/12/22 1000   polyethylene glycol (MIRALAX / GLYCOLAX) packet 17 g  17 g Oral Daily Chilton Si, MD   17 g at 06/12/22 1001   sodium chloride flush (NS) 0.9 % injection 3 mL  3 mL Intravenous Q12H Chilton Si, MD   3 mL at 06/12/22 1003   Facility-Administered Medications Ordered in Other Encounters  Medication Dose Route Frequency Provider Last Rate Last Admin   regadenoson (LEXISCAN) injection SOLN 0.4 mg  0.4 mg Intravenous Once Chilton Si, MD       technetium tetrofosmin (TC-MYOVIEW) injection 32.8 millicurie  32.8 millicurie Intravenous Once PRN Chilton Si, MD         Discharge Medications: Please see discharge summary for a list of discharge medications.  Relevant Imaging Results:  Relevant Lab Results:   Additional Information SSN:[id number]  Oletta Lamas, MSW, LCSWA, LCASA Transitions of Care  Clinical Social Worker I

## 2022-06-12 NOTE — Interval H&P Note (Signed)
History and Physical Interval Note:  06/12/2022 8:01 AM  George [name]  has presented today for surgery, with the diagnosis of bacteremia.  The various methods of treatment have been discussed with the patient and family. After consideration of risks, benefits and other options for treatment, the patient has consented to  Procedure(s): TRANSESOPHAGEAL ECHOCARDIOGRAM (N/A) as a surgical intervention.  The patient's history has been reviewed, patient examined, no change in status, stable for surgery.  I have reviewed the patient's chart and labs.  Questions were answered to the patient's satisfaction.     Chilton Si, MD

## 2022-06-12 NOTE — Anesthesia Postprocedure Evaluation (Signed)
Anesthesia Post Note  Patient: George [name]  Procedure(s) Performed: TRANSESOPHAGEAL ECHOCARDIOGRAM     Patient location during evaluation: Cath Lab Anesthesia Type: General Level of consciousness: awake and alert, patient cooperative and oriented Pain management: pain level controlled Vital Signs Assessment: post-procedure vital signs reviewed and stable Respiratory status: spontaneous breathing, nonlabored ventilation and respiratory function stable Cardiovascular status: stable and blood pressure returned to baseline Postop Assessment: no apparent nausea or vomiting Anesthetic complications: no   No notable events documented.  Last Vitals:  Vitals:   06/12/22 0907 06/12/22 0910  BP: 129/66 129/60  Pulse: 86 88  Resp: 17 17  Temp: 36.6 C   SpO2: 97% 94%    Last Pain:  Vitals:   06/12/22 0910  TempSrc:   PainSc: 0-No pain                 Alejandrina Raimer,E. Rubie Ficco

## 2022-06-12 NOTE — Progress Notes (Signed)
Day of Surgery Procedure(s) (LRB): TRANSESOPHAGEAL ECHOCARDIOGRAM (N/A) Subjective: Patient examined and images of TEE personally reviewed.  Followup blood cultures are negative Patient is afebrile TEE is negative for endocarditis with EF 50% ID recommends PICC line and 6 weeks of in Ancef   2g every 8 hours L clavicular head  still painful but no abnormality on exam Objective: Vital signs in last 24 hours: Temp:  [97.7 F (36.5 C)-98.4 F (36.9 C)] 98.4 F (36.9 C) (05/29 1055) Pulse Rate:  [73-92] 88 (05/29 1055) Cardiac Rhythm: Normal sinus rhythm (05/29 0900) Resp:  [15-19] 18 (05/29 1055) BP: (129-174)/(56-80) 171/70 (05/29 1055) SpO2:  [94 %-99 %] 95 % (05/29 1055) Weight:  [104.6 kg] 104.6 kg (05/29 0509)  Hemodynamic parameters for last 24 hours:  Nsr afebrile  Intake/Output from previous day: 05/28 0701 - 05/29 0700 In: 1200.1 [P.O.:600; IV Piggyback:600.1] Out: 1275 [Urine:1275] Intake/Output this shift: Total I/O In: 200 [I.V.:200] Out: 600 [Urine:600]       Exam    General- alert and comfortable    Neck- no JVD, no cervical adenopathy palpable, no carotid bruit, tender over L clavicular head   Lungs- clear without rales, wheezes   Cor- regular rate and rhythm, no murmur , gallop   Abdomen- soft, non-tender   Extremities - warm, non-tender, minimal edema   Neuro- oriented, appropriate, no focal weakness   Lab Results: No results for input(s): "WBC", "HGB", "HCT", "PLT" in the last 72 hours. BMET:  Recent Labs    06/10/22 0058 06/11/22 0031  NA 131* 134*  K 3.6 3.3*  CL 95* 96*  CO2 25 29  GLUCOSE 306* 179*  BUN 22 14  CREATININE 0.80 0.72  CALCIUM 8.1* 8.1*    PT/INR: No results for input(s): "LABPROT", "INR" in the last 72 hours. ABG    Component Value Date/Time   PHART 7.346 (L) 08/13/2012 1757   HCO3 25.2 (H) 08/13/2012 1757   TCO2 24 08/14/2012 1726   ACIDBASEDEF 1.0 08/13/2012 1757   O2SAT 96.0 08/13/2012 1757   CBG (last 3)   Recent Labs    06/12/22 0738 06/12/22 0918 06/12/22 1048  GLUCAP 124* 128* 244*    Assessment/Plan: S/P Procedure(s) (LRB): TRANSESOPHAGEAL ECHOCARDIOGRAM (N/A) Cont iv antibiotics per ID The Left [place] joint will be evaluated in [facility] office after patient is discharged and has had more iv antibiotics- no criteria for surgery now.   LOS: 7 days    Lovett Sox 06/12/2022

## 2022-06-12 NOTE — Progress Notes (Signed)
[facility]  Date of Admission:  06/05/2022           Reason for visit: Follow up on MSSA bacteremia  Current antibiotics: Cefazolin  ASSESSMENT:    77 y.o. male admitted with:  MSSA bacteremia: Patient presenting with concern for NSTEMI and found to have high-grade MSSA bacteremia of unclear source.  Blood cultures positive from 06/05/2022 and repeat blood cultures 06/07/2022 negative.  Patient underwent TEE this morning 06/12/2022 which was negative for endocarditis. Left sternoclavicular septic arthritis/osteomyelitis: MRI 06/10/2022 shows inflammatory changes surrounding the left [place]ias joint with marrow edema suspicious for septic arthritis and early osteomyelitis.  Seen by cardiothoracic surgery yesterday who recommended antibiotic management for this. Neck pain: Cervical spine MRI 5/24 noted some signal abnormalities at C4 possibly reflective of early discitis/osteomyelitis with short-term MRI follow-up recommended in approximately 3 weeks to assess for interval change. Left shoulder pain: Suspected referred pain due to #2 and #3.  Left shoulder MRI 5/24 showed rotator cuff pathology but no obvious infection.  Orthopedic surgery also tried to aspirate the shoulder but was unable to draw any fluid. Diabetes: A1c 8.6.  RECOMMENDATIONS:    Continue cefazolin 2 g every 8 hours Place PICC See OPAT note below Likely will get another cervical MRI in approximately 3 weeks to see if cervical infection has declared itself Will sign off, please call as needed   Diagnosis: Bacteremia, septic arthritis, osteomyelitis  Culture Result: MSSA  No Known Allergies  OPAT Orders Discharge antibiotics to be given via PICC line Discharge antibiotics: Per pharmacy protocol  Cefazolin 2 g every 8 hours  Duration: 6 weeks  End Date: 07/19/2022  [facility] Care Per Protocol:  Home health RN for IV administration and teaching; PICC line care and labs.    Labs weekly  while on IV antibiotics: _xxx_ CBC with differential _xxx_ BMP __ CMP _xxx_ CRP _xxx_ ESR __ Vancomycin trough __ CK  _xxx_ Please pull PIC at completion of IV antibiotics __ Please leave PIC in place until doctor has seen patient or been notified  Fax weekly labs to [phone]  Clinic Follow Up Appt: 06/26/22 at 4pm with Dr Thedore Mins    Principal Problem:   MSSA bacteremia Active Problems:   HTN (hypertension)   Uncontrolled type 2 diabetes mellitus with hyperglycemia (HCC)   Mixed hyperlipidemia   NSTEMI (non-ST elevated myocardial infarction) (HCC)   Sepsis due to methicillin susceptible Staphylococcus aureus (MSSA) without acute organ dysfunction (HCC)   Acute pain of left shoulder   Staphylococcal arthritis (HCC)    MEDICATIONS:    Scheduled Meds:  amLODipine  10 mg Oral Daily   aspirin EC  81 mg Oral Daily   atorvastatin  80 mg Oral Daily   butamben-tetracaine-benzocaine       carvedilol  12.5 mg Oral BID   docusate sodium  100 mg Oral BID   enoxaparin (LOVENOX) injection  40 mg Subcutaneous Q24H   insulin aspart  0-20 Units Subcutaneous TID WC   insulin aspart  0-5 Units Subcutaneous QHS   insulin aspart  3 Units Subcutaneous TID WC   insulin glargine-yfgn  75 Units Subcutaneous BID   melatonin  3 mg Oral QHS   polyethylene glycol  17 g Oral Daily   sodium chloride flush  3 mL Intravenous Q12H   Continuous Infusions:   ceFAZolin (ANCEF) IV 2 g (06/12/22 0536)   PRN Meds:.acetaminophen, butamben-tetracaine-benzocaine, diclofenac Sodium, nitroGLYCERIN, ondansetron (ZOFRAN) IV, oxyCODONE  SUBJECTIVE:  24 hour events:  No acute events noted Afebrile, Tmax 98.4 Repeat blood cultures finalized as no growth  TEE negative  Patient seen this morning with his wife at the bedside.  He continues to report shoulder pain, clavicular/[place] joint pain, and neck pain.  Review of Systems  All other systems reviewed and are negative.     OBJECTIVE:   Blood  pressure (!) 171/70, pulse 88, temperature 98.4 F (36.9 C), temperature source Oral, resp. rate 18, height 6\' 4"  (1.93 m), weight 104.6 kg, SpO2 95 %. Body mass index is 28.07 kg/m.  Physical Exam Constitutional:      General: He is not in acute distress.    Appearance: Normal appearance.  HENT:     Head: Normocephalic and atraumatic.  Eyes:     Extraocular Movements: Extraocular movements intact.     Conjunctiva/sclera: Conjunctivae normal.  Pulmonary:     Effort: Pulmonary effort is normal. No respiratory distress.  Abdominal:     General: There is no distension.     Palpations: Abdomen is soft.  Musculoskeletal:        General: Tenderness present.  Skin:    General: Skin is warm and dry.     Findings: No rash.  Neurological:     General: No focal deficit present.     Mental Status: He is alert and oriented to person, place, and time.  Psychiatric:        Mood and Affect: Mood normal.        Behavior: Behavior normal.      Lab Results: Lab Results  Component Value Date   WBC 7.0 06/09/2022   HGB 10.9 (L) 06/09/2022   HCT 33.2 (L) 06/09/2022   MCV 81.2 06/09/2022   PLT 166 06/09/2022    Lab Results  Component Value Date   NA 134 (L) 06/11/2022   K 3.3 (L) 06/11/2022   CO2 29 06/11/2022   GLUCOSE 179 (H) 06/11/2022   BUN 14 06/11/2022   CREATININE 0.72 06/11/2022   CALCIUM 8.1 (L) 06/11/2022   GFRNONAA >60 06/11/2022   GFRAA >60 08/11/2018    Lab Results  Component Value Date   ALT 35 08/11/2018   AST 29 08/11/2018   ALKPHOS 122 08/11/2018   BILITOT 0.6 08/11/2018       Component Value Date/Time   CRP 25.2 (H) 06/07/2022 0718       Component Value Date/Time   ESRSEDRATE 84 (H) 06/07/2022 0718     I have reviewed the micro and lab results in Epic.  Imaging: ECHO TEE  Result Date: 06/12/2022    TRANSESOPHOGEAL ECHO REPORT   Patient Name:   George [name] Date of Exam: 06/12/2022 Medical Rec #:  [id number]     Height:       76.0 in Accession #:     [id number]    Weight:       230.6 lb Date of Birth:  January 14, 1946     BSA:          2.353 m Patient Age:    77 years      BP:           174/80 mmHg Patient Gender: M             HR:           85 bpm. Exam Location:  Inpatient Procedure: Transesophageal Echo Indications:     Endocarditis  History:         Patient has prior  history of Echocardiogram examinations, most                  recent 05/28/2022. Previous Myocardial Infarction and CAD; Risk                  Factors:Diabetes and Hypertension.  Sonographer:     Darlys Gales Referring Phys:  [id number] Perlie Gold Diagnosing Phys: Chilton Si MD PROCEDURE: After discussion of the risks and benefits of a TEE, an informed consent was obtained from the patient. The transesophogeal probe was passed without difficulty through the esophogus of the patient. Local oropharyngeal anesthetic was provided with Cetacaine. Sedation performed by different physician. The patient was monitored while under deep sedation. Anesthestetic sedation was provided intravenously by Anesthesiology: 182mg  of Propofol, 100mg  of Lidocaine. The patient's vital signs; including heart rate, blood pressure, and oxygen saturation; remained stable throughout the procedure. The patient developed no complications during the procedure.  IMPRESSIONS  1. Left ventricular ejection fraction, by estimation, is 45 to 50%. The left ventricle has mildly decreased function. The left ventricle demonstrates global hypokinesis.  2. Right ventricular systolic function is normal. The right ventricular size is normal.  3. No left atrial/left atrial appendage thrombus was detected.  4. The mitral valve is normal in structure. Mild mitral valve regurgitation. No evidence of mitral stenosis.  5. Eccentric anteriorly directed aortic regurgitation. The aortic valve is tricuspid. Aortic valve regurgitation is mild to moderate. No aortic stenosis is present.  6. The inferior vena cava is normal in size with greater than  50% respiratory variability, suggesting right atrial pressure of 3 mmHg. Conclusion(s)/Recommendation(s): No evidence of vegetation/infective endocarditis on this transesophageael echocardiogram. FINDINGS  Left Ventricle: Left ventricular ejection fraction, by estimation, is 45 to 50%. The left ventricle has mildly decreased function. The left ventricle demonstrates global hypokinesis. The left ventricular internal cavity size was normal in size. There is  no left ventricular hypertrophy. Right Ventricle: The right ventricular size is normal. No increase in right ventricular wall thickness. Right ventricular systolic function is normal. Left Atrium: Left atrial size was normal in size. No left atrial/left atrial appendage thrombus was detected. Right Atrium: Right atrial size was normal in size. Pericardium: There is no evidence of pericardial effusion. Mitral Valve: The mitral valve is normal in structure. Mild mitral valve regurgitation. No evidence of mitral valve stenosis. Tricuspid Valve: The tricuspid valve is normal in structure. Tricuspid valve regurgitation is not demonstrated. No evidence of tricuspid stenosis. Aortic Valve: Eccentric anteriorly directed aortic regurgitation. The aortic valve is tricuspid. Aortic valve regurgitation is mild to moderate. No aortic stenosis is present. Pulmonic Valve: The pulmonic valve was normal in structure. Pulmonic valve regurgitation is not visualized. No evidence of pulmonic stenosis. Aorta: The aortic root is normal in size and structure. Venous: The inferior vena cava is normal in size with greater than 50% respiratory variability, suggesting right atrial pressure of 3 mmHg. IAS/Shunts: No atrial level shunt detected by color flow Doppler. Additional Comments: Spectral Doppler performed. Chilton Si MD Electronically signed by Chilton Si MD Signature Date/Time: 06/12/2022/11:21:08 AM    Final    EP STUDY  Result Date: 06/12/2022 See surgical note for  result.  [place] EKG SITE RITE  Result Date: 06/12/2022 If Site Rite image not attached, placement could not be confirmed due to current cardiac rhythm.    Imaging independently reviewed in Epic.    Vedia Coffer for Infectious [facility] [phone] pager 06/12/2022, 11:53 AM  I have personally spent 50 minutes involved in face-to-face and non-face-to-face activities for this patient on the day of the visit. Professional time spent includes the following activities: Preparing to see the patient (review of tests), Obtaining and/or reviewing separately obtained history (admission/discharge record), Performing a medically appropriate examination and/or evaluation , Ordering medications/tests/procedures, referring and communicating with other health care professionals, Documenting clinical information in the EMR, Independently interpreting results (not separately reported), Communicating results to the patient/family/caregiver, Counseling and educating the patient/family/caregiver and Care coordination (not separately reported).

## 2022-06-12 NOTE — Progress Notes (Signed)
Peripherally Inserted Central Catheter Placement  The IV Nurse has discussed with the patient and/or persons authorized to consent for the patient, the purpose of this procedure and the potential benefits and risks involved with this procedure.  The benefits include less needle sticks, lab draws from the catheter, and the patient may be discharged home with the catheter. Risks include, but not limited to, infection, bleeding, blood clot (thrombus formation), and puncture of an artery; nerve damage and irregular heartbeat and possibility to perform a PICC exchange if needed/ordered by physician.  Alternatives to this procedure were also discussed.  Bard Power PICC patient education guide, fact sheet on infection prevention and patient information card has been provided to patient /or left at bedside.    PICC Placement Documentation  PICC Single Lumen 06/12/22 Right Basilic 41 cm 0 cm (Active)  Indication for Insertion or Continuance of Line Home intravenous therapies (PICC only) 06/12/22 1757  Exposed Catheter (cm) 0 cm 06/12/22 1757  Site Assessment Clean, Dry, Intact 06/12/22 1757  Line Status Flushed;Blood return noted;Saline locked 06/12/22 1757  Dressing Type Transparent 06/12/22 1757  Dressing Status Antimicrobial disc in place 06/12/22 1757  Safety Lock Not Applicable 06/12/22 1757  Line Care Connections checked and tightened 06/12/22 1757  Line Adjustment (NICU/IV Team Only) No 06/12/22 1757  Dressing Intervention New dressing 06/12/22 1757  Dressing Change Due 06/19/22 06/12/22 1757       Audrie Gallus 06/12/2022, 6:01 PM

## 2022-06-12 NOTE — TOC Initial Note (Signed)
Transition of Care [facility]) - Initial/Assessment Note    Patient Details  Name: George [name] MRN: [id number] Date of Birth: 1945-09-25  Transition of Care [facility]) CM/SW Contact:    Leander Rams, LCSW Phone Number: 06/12/2022, 10:46 AM  Clinical Narrative:                 CSW met with pt at bedside alongside wife. CSW discussed the recommendation for STR at SNF. Pt is agreeable to go to SNF. However, pt is a bit overwhelmed at the moment and was unsure of exactly what he wanted to do and requested CSW return at a later time. Pt and wife are okay with referrals being sent to facilities in [place].   CSW will complete fl2 and fax out. TOC will continue to follow.   Expected Discharge Plan: Skilled Nursing Facility     Patient Goals and CMS Choice            Expected Discharge Plan and Services       Living arrangements for the past 2 months: Single Family Home                                      Prior Living Arrangements/Services Living arrangements for the past 2 months: Single Family Home Lives with:: Self, Spouse Patient language and need for interpreter reviewed:: Yes Do you feel safe going back to the place where you live?: Yes      Need for Family Participation in Patient Care: Yes (Comment) Care giver support system in place?: Yes (comment)   Criminal Activity/Legal Involvement Pertinent to Current Situation/Hospitalization: No - Comment as needed  Activities of Daily Living Home Assistive Devices/Equipment: None ADL Screening (condition at time of admission) Patient's cognitive ability adequate to safely complete daily activities?: No (Prior to admission pt was compitent) Is the patient deaf or have difficulty hearing?: No Does the patient have difficulty seeing, even when wearing glasses/contacts?: No Does the patient have difficulty concentrating, remembering, or making decisions?: No Patient able to express need for assistance with ADLs?: Yes Does the  patient have difficulty dressing or bathing?: No Independently performs ADLs?: No Communication: Needs assistance Is this a change from baseline?: Change from baseline, expected to last <3 days Dressing (OT): Needs assistance Is this a change from baseline?: Change from baseline, expected to last <3days Grooming: Needs assistance Is this a change from baseline?: Change from baseline, expected to last <3 days Feeding: Needs assistance Is this a change from baseline?: Change from baseline, expected to last <3 days Bathing: Needs assistance Is this a change from baseline?: Change from baseline, expected to last <3 days Toileting: Needs assistance Is this a change from baseline?: Change from baseline, expected to last <3 days In/Out Bed: Needs assistance Is this a change from baseline?: Change from baseline, expected to last <3 days Walks in Home: Independent Does the patient have difficulty walking or climbing stairs?: No Weakness of Legs: Both Weakness of Arms/Hands: Both  Permission Sought/Granted Permission sought to share information with : Facility Industrial/product designer granted to share information with : Yes, Verbal Permission Granted  Share Information with NAME: Carletta     Permission granted to share info w Relationship: Wife  Permission granted to share info w Contact Information: [phone]  Emotional Assessment Appearance:: Appears stated age, Developmentally appropriate Attitude/Demeanor/Rapport: Engaged Affect (typically observed): Accepting Orientation: : Oriented to Self, Oriented  to Place, Oriented to  Time, Oriented to Situation Alcohol / Substance Use: Not Applicable Psych Involvement: No (comment)  Admission diagnosis:  NSTEMI (non-ST elevated myocardial infarction) [facility]) [I21.4] Patient Active Problem List   Diagnosis Date Noted   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   MSSA bacteremia 06/06/2022   Sepsis due to methicillin susceptible  Staphylococcus aureus (MSSA) without acute organ dysfunction (HCC) 06/06/2022   Acute pain of left shoulder 06/06/2022   NSTEMI (non-ST elevated myocardial infarction) (HCC) 06/05/2022   Malignant neoplasm of prostate (HCC) 05/01/2017   Uncontrolled type 2 diabetes mellitus with hyperglycemia (HCC) 02/26/2017   Mixed hyperlipidemia 02/26/2017   Essential hypertension, benign 02/26/2017   Thoracic ascending aortic aneurysm (HCC) 01/02/2017   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 - [place], [place] - [address] [place] [address] Phone: [phone] Fax: [phone]  [facility] - [place], [place] - [address] [place] [address] Phone: [phone] Fax: [phone]     Social Determinants of Health (SDOH) Social History: SDOH Screenings   Food Insecurity: No Food Insecurity (06/06/2022)  Housing: Patient Declined (06/06/2022)  Transportation Needs: No Transportation Needs (06/06/2022)  Utilities: Not At Risk (06/06/2022)  Tobacco Use: Medium Risk (06/12/2022)   SDOH Interventions:     Readmission Risk Interventions     No data to display         Oletta Lamas, MSW, LCSWA, LCASA Transitions of Care  Clinical Social Worker I

## 2022-06-12 NOTE — Transfer of Care (Signed)
Immediate Anesthesia Transfer of Care Note  Patient: George [name]  Procedure(s) Performed: TRANSESOPHAGEAL ECHOCARDIOGRAM  Patient Location: Cath Lab  Anesthesia Type:MAC  Level of Consciousness: awake  Airway & Oxygen Therapy: Patient Spontanous Breathing  Post-op Assessment: Report given to RN and Post -op Vital signs reviewed and stable  Post vital signs: Reviewed and stable  Last Vitals:  Vitals Value Taken Time  BP 138/67 06/12/22 0904  Temp    Pulse 84 06/12/22 0904  Resp 16 06/12/22 0904  SpO2 96 % 06/12/22 0904  Vitals shown include unvalidated device data.  Last Pain:  Vitals:   06/12/22 0735  TempSrc: Temporal  PainSc: 0-No pain         Complications: No notable events documented.

## 2022-06-12 NOTE — Anesthesia Preprocedure Evaluation (Addendum)
Anesthesia Evaluation  Patient identified by MRN, date of birth, ID band Patient awake    Reviewed: Allergy & Precautions, NPO status , Unable to perform ROS - Chart review only  Airway Mallampati: II  TM Distance: >3 FB Neck ROM: Full    Dental  (+) Edentulous Upper, Poor Dentition, Missing, Dental Advisory Given   Pulmonary former smoker   Pulmonary exam normal breath sounds clear to auscultation       Cardiovascular hypertension, Pt. on medications and Pt. on home beta blockers + CAD and + CABG   Rhythm:Regular Rate:Normal  05/29/2022 ECHO: EF 50%, mildly decreased LVF, mod LVH, normal RVF, mild MR  05/08/2022 stress:   Findings are consistent with prior infarction. The study is intermediate risk due to reduced systolic function.   No ST deviation was noted.   LV perfusion is abnormal. There is no evidence of ischemia. There is evidence of infarction. Defect 1: There is a medium defect with moderate reduction in uptake present in the apical to basal location(s) that is fixed. There is abnormal wall motion in the defect area. Consistent with infarction and artifact.   Left ventricular function is abnormal. Global function is moderately reduced. There were multiple regional abnormalities. Nuclear stress EF: 38 %. The left ventricular ejection fraction is moderately decreased (30-44%). End diastolic cavity size is moderately enlarged. End systolic cavity size is moderately enlarged.    Neuro/Psych    GI/Hepatic ,GERD  ,,  Endo/Other  diabetes, Insulin Dependent    Renal/GU    Prostate cancer    Musculoskeletal  (+) Arthritis ,    Abdominal   Peds  Hematology   Anesthesia Other Findings   Reproductive/Obstetrics                             Anesthesia Physical Anesthesia Plan  ASA: 3  Anesthesia Plan: General   Post-op Pain Management: Minimal or no pain anticipated   Induction:  Intravenous  PONV Risk Score and Plan: 2 and Treatment may vary due to age or medical condition, Propofol infusion and TIVA  Airway Management Planned: Natural Airway and Mask  Additional Equipment: None  Intra-op Plan:   Post-operative Plan:   Informed Consent: I have reviewed the patients History and Physical, chart, labs and discussed the procedure including the risks, benefits and alternatives for the proposed anesthesia with the patient or authorized representative who has indicated his/her understanding and acceptance.     Dental advisory given  Plan Discussed with: CRNA  Anesthesia Plan Comments:        Anesthesia Quick Evaluation

## 2022-06-12 NOTE — Progress Notes (Signed)
OT Cancellation Note  Patient Details Name: George [name] MRN: [id number] DOB: 26-Feb-1945   Cancelled Treatment:    Reason Eval/Treat Not Completed: Patient at procedure or test/ unavailable. Will return.  Evern Bio 06/12/2022, 7:53 AM Berna Spare, OTR/L Acute Rehabilitation Services Office: [phone]

## 2022-06-12 NOTE — Progress Notes (Signed)
PHARMACY CONSULT NOTE FOR:  OUTPATIENT  PARENTERAL ANTIBIOTIC THERAPY (OPAT)  Indication: MSSA bacteremia/[place] joint osteo Regimen: Cefazolin 2 gm IV q 8 hours  End date: 07/19/22  IV antibiotic discharge orders are pended. To discharging provider:  please sign these orders via discharge navigator,  Select New Orders & click on the button choice - Manage This Unsigned Work.     Thank you for allowing pharmacy to be a part of this patient's care.  Sharin Mons, PharmD, BCPS, BCIDP Infectious Diseases Clinical Pharmacist Phone: [phone] 06/12/2022, 10:43 AM

## 2022-06-12 NOTE — Progress Notes (Signed)
Occupational Therapy Treatment Patient Details Name: George [name] MRN: [id number] DOB: 01/21/1945 Today's Date: 06/12/2022   History of present illness Patient is 77 y.o. male who presented with chest pain. PMH significant of AAA, chronic pain, CAD s/p CABG, HTN, HLD, DM, chronic systolic CHF, and prostate CA. Recent stress test on 05/08/22 showed prior infarction and was intermediate risk with EF 30-44%.  He was admitted under cardiology with NSTEMI with plan for cardiac catheterization.  he spiked a fever to 102.  His only complaint is L shoulder pain that radiates into his L neck and jaw and into his L chest.  Hospitalist were consulted for that.  Eventually Ortho and ID were consulted. There is straightening of the normal cervical lordosis, anterior cervical osteophytes C4;ST consulted d/t c/o swallowing difficulty by pt/family.   OT comments  Pt continues to demonstrate impaired cognition complicated by poor hearing. Disoriented to all but himself, requiring increased time to follow commands and demonstrating poor safety awareness. Pt able to perform supine to sit at R side of bed with bed rail without physical assist. Stood with cues for hand placement with walker and transferred with min guard assist to bed>recliner>BSC>recliner. VSS. Per family, pt's cognition was better this morning. RN aware of progress in mobility and increased confusion this afternoon vs this morning. Improved pain in L shoulder with pt spontaneously performing wide range reaching. Updated d/c. Patient will benefit from continued inpatient follow up therapy, <3 hours/day.   Recommendations for follow up therapy are one component of a multi-disciplinary discharge planning process, led by the attending physician.  Recommendations may be updated based on patient status, additional functional criteria and insurance authorization.    Assistance Recommended at Discharge Frequent or constant Supervision/Assistance  Patient can  return home with the following  A little help with walking and/or transfers;A lot of help with bathing/dressing/bathroom;Assistance with cooking/housework;Direct supervision/assist for medications management;Direct supervision/assist for financial management;Assist for transportation;Help with stairs or ramp for entrance   Equipment Recommendations  BSC/3in1    Recommendations for Other Services      Precautions / Restrictions Precautions Precautions: Fall Required Braces or Orthoses: Sling (L for comfort) Restrictions Weight Bearing Restrictions: No       Mobility Bed Mobility Overal bed mobility: Needs Assistance Bed Mobility: Supine to Sit     Supine to sit: Supervision, HOB elevated     General bed mobility comments: increased time, no physical assist    Transfers Overall transfer level: Needs assistance Equipment used: Rolling walker (2 wheels) Transfers: Sit to/from Stand, Bed to chair/wheelchair/BSC Sit to Stand: Min guard     Step pivot transfers: Min guard     General transfer comment: cues for hand placement, assist for lines and safety     Balance Overall balance assessment: Needs assistance Sitting-balance support: Single extremity supported, No upper extremity supported Sitting balance-Leahy Scale: Fair     Standing balance support: Bilateral upper extremity supported Standing balance-Leahy Scale: Poor                             ADL either performed or assessed with clinical judgement   ADL Overall ADL's : Needs assistance/impaired Eating/Feeding: Set up;Sitting   Grooming: Wash/dry hands;Wash/dry face;Sitting;Set up                   Toilet Transfer: Minimal assistance;Stand-pivot;BSC/3in1  Extremity/Trunk Assessment Upper Extremity Assessment Upper Extremity Assessment: LUE deficits/detail LUE Deficits / Details: FF actively to 120 degrees            Vision       Perception     Praxis       Cognition Arousal/Alertness: Awake/alert Behavior During Therapy: Impulsive Overall Cognitive Status: Impaired/Different from baseline Area of Impairment: Orientation, Memory, Following commands, Safety/judgement, Awareness, Problem solving, Attention                 Orientation Level: Time, Situation Current Attention Level: Sustained Memory: Decreased short-term memory Following Commands: Follows one step commands with increased time Safety/Judgement: Decreased awareness of safety, Decreased awareness of deficits Awareness: Intellectual Problem Solving: Slow processing, Decreased initiation, Difficulty sequencing, Requires verbal cues, Requires tactile cues          Exercises      Shoulder Instructions       General Comments      Pertinent Vitals/ Pain       Pain Assessment Pain Assessment: Faces Faces Pain Scale: Hurts little more Pain Location: L shoulder, neck Pain Descriptors / Indicators: Sore, Guarding, Grimacing Pain Intervention(s): Monitored during session, Repositioned  Home Living                                          Prior Functioning/Environment              Frequency  Min 1X/week        Progress Toward Goals  OT Goals(current goals can now be found in the care plan section)  Progress towards OT goals: Progressing toward goals  Acute Rehab OT Goals OT Goal Formulation: With patient Time For Goal Achievement: 06/23/22 Potential to Achieve Goals: Good  Plan Discharge plan needs to be updated    Co-evaluation                 AM-PAC OT "6 Clicks" Daily Activity     Outcome Measure   Help from another person eating meals?: A Little Help from another person taking care of personal grooming?: A Little Help from another person toileting, which includes using toliet, bedpan, or urinal?: A Lot Help from another person bathing (including washing, rinsing, drying)?: A Lot Help from another person to  put on and taking off regular upper body clothing?: A Little Help from another person to put on and taking off regular lower body clothing?: A Lot 6 Click Score: 15    End of Session Equipment Utilized During Treatment: Rolling walker (2 wheels);Gait belt  OT Visit Diagnosis: Unsteadiness on feet (R26.81);Pain;Muscle weakness (generalized) (M62.81);Other symptoms and signs involving cognitive function Pain - Right/Left: Left Pain - part of body: Shoulder   Activity Tolerance Patient tolerated treatment well   Patient Left in chair;with call bell/phone within reach;with chair alarm set;with family/visitor present   Nurse Communication Mobility status        Time: 1350-1440 OT Time Calculation (min): 50 min  Charges: OT General Charges $OT Visit: 1 Visit OT Treatments $Self Care/Home Management : 38-52 mins  Berna Spare, OTR/L Acute Rehabilitation Services Office: [phone]  Evern Bio 06/12/2022, 3:17 PM

## 2022-06-12 NOTE — CV Procedure (Signed)
Brief TEE Note  LVEF 45-50% Mild MR.  Trivial TR.  Mild-moderate anteriorly directed AR. No LAA thrombus or masses No evidence of endocarditis.  For additional details see full report.  George Minerva C. Duke Salvia, MD, [facility] 06/12/2022 9:02 AM

## 2022-06-12 NOTE — Progress Notes (Signed)
PROGRESS NOTE    George [name]  ZOX:[id number] DOB: 14-Aug-1945 DOA: 06/05/2022 PCP: Kirstie Peri, MD   Brief Narrative:  George [name] is a 77 y.o. male with past medical history of AAA, chronic pain, CAD s/p CABG, HTN, HLD, DM, chronic systolic CHF, and prostate CA who presented with chest pain.  Recent stress test on 05/08/22 showed prior infarction and was intermediate risk with EF 30-44%.  He was admitted under cardiology with NSTEMI with plan for cardiac catheterization.  he spiked a fever to 102.  His only complaint is L shoulder pain that radiates into his L neck and jaw and into his L chest.  Hospitalist were consulted for that.  Eventually Ortho and ID were consulted.  Assessment & Plan:   Principal Problem:   MSSA bacteremia Active Problems:   HTN (hypertension)   Uncontrolled type 2 diabetes mellitus with hyperglycemia (HCC)   Mixed hyperlipidemia   NSTEMI (non-ST elevated myocardial infarction) (HCC)   Sepsis due to methicillin susceptible Staphylococcus aureus (MSSA) without acute organ dysfunction (HCC)   Acute pain of left shoulder   Staphylococcal arthritis (HCC)  NSTEMI (non-ST elevated myocardial infarction) (HCC) ruled out -Patient with prior h/o CAD s/p CABG presenting with chest pain and uptrending troponin, initially thought to be consistent with NSTEMI.  he was started on heparin drip and plan was cardiac cath but then it was opined by cardiology that there is very low concern of ACS so heparin was discontinued and no plan for cardiac cath and cardiology signed off on 06/07/2022.   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  Complains of neck pain and left shoulder pain.  Ortho was consulted.  They did arthrocentesis of the left shoulder but it was a dry tap.  MRI spine shows some concern of possible C4 discitis but no infection in the left shoulder.  MRI left shoulder shows multiple torn ligaments.  Will need outpatient  follow-up with orthopedics once infection is eradicated and antibiotics completed.  May need repeat MRI of the cervical spine in few weeks.  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  Seen by cardiothoracic surgery, antibiotics recommended.  Underwent TEE 06/12/2022, endocarditis ruled out.  Antibiotics per ID.  Mixed hyperlipidemia -Continue high-dose Lipitor   Uncontrolled type 2 diabetes mellitus with hyperglycemia (HCC) Hemoglobin A1c 8.6.  Appears to be taking 70 units of long-acting insulin at night however we have been escalating his long-acting insulin and currently he is on 75 units Semglee twice daily and finally his blood sugar appears to be well-controlled.   HTN (hypertension) -Blood pressure was stable but is elevated today.  Continue amlodipine and carvedilol (was increased to 12.5 mg p.o. twice daily by cardiology).  For some reason, he also appears to be on Toprol-XL which we are holding.  We are also holding Catapres.  Unsure why he is not on ACE inhibitors.  Will start on lisinopril 20 mg p.o. daily.  Hypokalemia: Replaced yesterday.  Recheck in the morning.  Generalized weakness/deconditioning: Patient complains of weakness.  Per wife, he cannot even get up.  He has been seen by PT OT and they have recommended home health.  I have serious doubts that this patient will not do well at home and he qualifies for SNF.  I have reconsulted PT to reassess him.  Family dynamics: Patient's granddaughter tends to be at bedside for most part.  Reportedly, she has been disrespectful to the staff.  On my daily rounds, I explained my plan of care for the day, but she tends to focus on her phone.  She would ask me question, the answer of which I had just explained in my plan.  When I would answer her question again, she would start calling someone and talking on the phone.  Patient's wife informed us patient's granddaughter is not her  granddaughter and she also does not like her to be present every time.  She asked me and the staff that she should be the one who we should be communicating with regarding plan of care.   DVT prophylaxis: enoxaparin (LOVENOX) injection 40 mg Start: 06/08/22 1000Heparin drip   Code Status: Full Code  Family Communication: granddaughter and wife present at bedside.  Plan of care discussed with patient in length and he/she verbalized understanding and agreed with it.  Status is: Inpatient Remains inpatient appropriate because: Patient medically stable at this point in time, all workup completed, pending PT evaluation.  Will likely require SNF discharge.   Estimated body mass index is 28.07 kg/m as calculated from the following:   Height as of this encounter: 6\' 4"  (1.93 m).   Weight as of this encounter: 104.6 kg.    Nutritional Assessment: Body mass index is 28.07 kg/m.Marland Kitchen Seen by dietician.  I agree with the assessment and plan as outlined below: Nutrition Status:        . Skin Assessment: I have examined the patient's skin and I agree with the wound assessment as performed by the wound care RN as outlined below:    Consultants:  ID and orthopedics Cardiology-signed off  Procedures:  As above  Antimicrobials:  Anti-infectives (From admission, onward)    Start     Dose/Rate Route Frequency Ordered Stop   06/06/22 1430  ceFAZolin (ANCEF) IVPB 2g/100 mL premix        2 g 200 mL/hr over 30 Minutes Intravenous Every 8 hours 06/06/22 1339     06/06/22 1400  doxycycline (VIBRA-TABS) tablet 100 mg  Status:  Discontinued        100 mg Oral Every 12 hours 06/06/22 1222 06/06/22 1449   06/06/22 1300  vancomycin (VANCOREADY) IVPB 1250 mg/250 mL  Status:  Discontinued        1,250 mg 166.7 mL/hr over 90 Minutes Intravenous Every 12 hours 06/05/22 2247 06/06/22 1339   06/06/22 0600  piperacillin-tazobactam (ZOSYN) IVPB 3.375 g  Status:  Discontinued        3.375 g 12.5 mL/hr over  240 Minutes Intravenous Every 8 hours 06/05/22 2247 06/06/22 1339   06/05/22 2345  vancomycin (VANCOREADY) IVPB 2000 mg/400 mL        2,000 mg 200 mL/hr over 120 Minutes Intravenous  Once 06/05/22 2247 06/06/22 0820   06/05/22 2345  piperacillin-tazobactam (ZOSYN) IVPB 3.375 g        3.375 g 100 mL/hr over 30 Minutes Intravenous  Once 06/05/22 2247 06/06/22 1004         Subjective: Patient seen and examined.  No new complaint other than neck pain and left shoulder pain.  Wife at the bedside.  Objective: Vitals:   06/12/22 0910 06/12/22 0918 06/12/22 0920 06/12/22 1055  BP: 129/60 (!) 143/69 (!) 143/69 (!) 171/70  Pulse: 88 92 92 88  Resp: 17 15 17 18   Temp:    98.4 F (36.9 C)  TempSrc:    Oral  SpO2: 94% 99% 94% 95%  Weight:      Height:  Intake/Output Summary (Last 24 hours) at 06/12/2022 1214 Last data filed at 06/12/2022 0905 Gross per 24 hour  Intake 1160.06 ml  Output 850 ml  Net 310.06 ml    Filed Weights   06/10/22 0349 06/11/22 0420 06/12/22 0509  Weight: 104.9 kg 106.3 kg 104.6 kg    Examination:  General exam: Appears calm and comfortable  Respiratory system: Clear to auscultation. Respiratory effort normal. Cardiovascular system: S1 & S2 heard, RRR. No JVD, murmurs, rubs, gallops or clicks. No pedal edema. Gastrointestinal system: Abdomen is nondistended, soft and nontender. No organomegaly or masses felt. Normal bowel sounds heard. Central nervous system: Alert and oriented. No focal neurological deficits. Extremities: Limited range of motion left shoulder due to pain. Psychiatry: Judgement and insight appear normal. Mood & affect appropriate.   Data Reviewed: I have personally reviewed following labs and imaging studies  CBC: Recent Labs  Lab 06/05/22 2226 06/06/22 0643 06/07/22 0718 06/08/22 0042 06/09/22 0101  WBC 13.0* 12.2* 9.0 6.6 7.0  NEUTROABS 11.3*  --   --   --   --   HGB 12.9* 12.6* 11.9* 11.2* 10.9*  HCT 38.7* 37.2* 36.1*  33.3* 33.2*  MCV 81.0 79.7* 81.5 81.8 81.2  PLT 166 161 161 158 166    Basic Metabolic Panel: Recent Labs  Lab 06/07/22 0659 06/08/22 0042 06/09/22 0101 06/10/22 0058 06/11/22 0031  NA 130* 131* 132* 131* 134*  K 3.6 3.3* 3.8 3.6 3.3*  CL 95* 96* 98 95* 96*  CO2 23 26 25 25 29   GLUCOSE 331* 263* 193* 306* 179*  BUN 24* 28* 22 22 14   CREATININE 0.95 1.05 0.75 0.80 0.72  CALCIUM 8.6* 8.3* 8.1* 8.1* 8.1*    GFR: Estimated Creatinine Clearance: 102.7 mL/min (by C-G formula based on SCr of 0.72 mg/dL). Liver Function Tests: No results for input(s): "AST", "ALT", "ALKPHOS", "BILITOT", "PROT", "ALBUMIN" in the last 168 hours. No results for input(s): "LIPASE", "AMYLASE" in the last 168 hours. No results for input(s): "AMMONIA" in the last 168 hours. Coagulation Profile: No results for input(s): "INR", "PROTIME" in the last 168 hours. Cardiac Enzymes: No results for input(s): "CKTOTAL", "CKMB", "CKMBINDEX", "TROPONINI" in the last 168 hours. BNP (last 3 results) No results for input(s): "PROBNP" in the last 8760 hours. HbA1C: No results for input(s): "HGBA1C" in the last 72 hours.  CBG: Recent Labs  Lab 06/11/22 2119 06/12/22 0612 06/12/22 0738 06/12/22 0918 06/12/22 1048  GLUCAP 87 140* 124* 128* 244*    Lipid Profile: No results for input(s): "CHOL", "HDL", "LDLCALC", "TRIG", "CHOLHDL", "LDLDIRECT" in the last 72 hours.  Thyroid Function Tests: No results for input(s): "TSH", "T4TOTAL", "FREET4", "T3FREE", "THYROIDAB" in the last 72 hours. Anemia Panel: No results for input(s): "VITAMINB12", "FOLATE", "FERRITIN", "TIBC", "IRON", "RETICCTPCT" in the last 72 hours. Sepsis Labs: No results for input(s): "PROCALCITON", "LATICACIDVEN" in the last 168 hours.  Recent Results (from the past 240 hour(s))  Culture, blood (Routine X 2) w Reflex to ID Panel     Status: Abnormal   Collection Time: 06/05/22 10:26 PM   Specimen: BLOOD LEFT HAND  Result Value Ref Range Status    Specimen Description BLOOD LEFT HAND  Final   Special Requests NONE  Final   Culture  Setup Time   Final    GRAM POSITIVE COCCI IN CLUSTERS IN BOTH AEROBIC AND ANAEROBIC BOTTLES CRITICAL VALUE NOTED.  VALUE IS CONSISTENT WITH PREVIOUSLY REPORTED AND CALLED VALUE.    Culture (A)  Final    STAPHYLOCOCCUS  AUREUS SUSCEPTIBILITIES PERFORMED ON PREVIOUS CULTURE WITHIN THE LAST 5 DAYS. Performed at [facility], [address] [place], [place] [address]    Report Status 06/08/2022 FINAL  Final  Culture, blood (Routine X 2) w Reflex to ID Panel     Status: Abnormal   Collection Time: 06/05/22 10:26 PM   Specimen: BLOOD RIGHT HAND  Result Value Ref Range Status   Specimen Description BLOOD RIGHT HAND  Final   Special Requests   Final    BOTTLES DRAWN AEROBIC AND ANAEROBIC Blood Culture adequate volume   Culture  Setup Time   Final    GRAM POSITIVE COCCI IN CLUSTERS IN BOTH AEROBIC AND ANAEROBIC BOTTLES CRITICAL RESULT CALLED TO, READ BACK BY AND VERIFIED WITH: PHARMD A PAYTES [id number] AT 1325 BY CM Performed at [facility], [address], [place], [place] [address]    Culture STAPHYLOCOCCUS AUREUS (A)  Final   Report Status 06/08/2022 FINAL  Final   Organism ID, Bacteria STAPHYLOCOCCUS AUREUS  Final      Susceptibility   Staphylococcus aureus - MIC*    CIPROFLOXACIN <=0.5 SENSITIVE Sensitive     ERYTHROMYCIN <=0.25 SENSITIVE Sensitive     GENTAMICIN <=0.5 SENSITIVE Sensitive     OXACILLIN 0.5 SENSITIVE Sensitive     TETRACYCLINE <=1 SENSITIVE Sensitive     VANCOMYCIN <=0.5 SENSITIVE Sensitive     TRIMETH/SULFA <=10 SENSITIVE Sensitive     CLINDAMYCIN <=0.25 SENSITIVE Sensitive     RIFAMPIN <=0.5 SENSITIVE Sensitive     Inducible Clindamycin NEGATIVE Sensitive     LINEZOLID 2 SENSITIVE Sensitive     * STAPHYLOCOCCUS AUREUS  Blood Culture ID Panel (Reflexed)     Status: Abnormal   Collection Time: 06/05/22 10:26 PM  Result Value Ref Range Status   Enterococcus  faecalis NOT DETECTED NOT DETECTED Final   Enterococcus Faecium NOT DETECTED NOT DETECTED Final   Listeria monocytogenes NOT DETECTED NOT DETECTED Final   Staphylococcus species DETECTED (A) NOT DETECTED Final    Comment: CRITICAL RESULT CALLED TO, READ BACK BY AND VERIFIED WITH: PHARMD A PAYTES [id number] AT 1325 BY CM    Staphylococcus aureus (BCID) DETECTED (A) NOT DETECTED Final    Comment: CRITICAL RESULT CALLED TO, READ BACK BY AND VERIFIED WITH: PHARMD A PAYTES [id number] AT 1325 BY CM    Staphylococcus epidermidis NOT DETECTED NOT DETECTED Final   Staphylococcus lugdunensis NOT DETECTED NOT DETECTED Final   Streptococcus species NOT DETECTED NOT DETECTED Final   Streptococcus agalactiae NOT DETECTED NOT DETECTED Final   Streptococcus pneumoniae NOT DETECTED NOT DETECTED Final   Streptococcus pyogenes NOT DETECTED NOT DETECTED Final   A.calcoaceticus-baumannii NOT DETECTED NOT DETECTED Final   Bacteroides fragilis NOT DETECTED NOT DETECTED Final   Enterobacterales NOT DETECTED NOT DETECTED Final   Enterobacter cloacae complex NOT DETECTED NOT DETECTED Final   Escherichia coli NOT DETECTED NOT DETECTED Final   Klebsiella aerogenes NOT DETECTED NOT DETECTED Final   Klebsiella oxytoca NOT DETECTED NOT DETECTED Final   Klebsiella pneumoniae NOT DETECTED NOT DETECTED Final   Proteus species NOT DETECTED NOT DETECTED Final   Salmonella species NOT DETECTED NOT DETECTED Final   Serratia marcescens NOT DETECTED NOT DETECTED Final   Haemophilus influenzae NOT DETECTED NOT DETECTED Final   Neisseria meningitidis NOT DETECTED NOT DETECTED Final   Pseudomonas aeruginosa NOT DETECTED NOT DETECTED Final   Stenotrophomonas maltophilia NOT DETECTED NOT DETECTED Final   Candida albicans NOT DETECTED NOT DETECTED Final  Candida auris NOT DETECTED NOT DETECTED Final   Candida glabrata NOT DETECTED NOT DETECTED Final   Candida krusei NOT DETECTED NOT DETECTED Final   Candida parapsilosis NOT DETECTED  NOT DETECTED Final   Candida tropicalis NOT DETECTED NOT DETECTED Final   Cryptococcus neoformans/gattii NOT DETECTED NOT DETECTED Final   Meth resistant mecA/C and MREJ NOT DETECTED NOT DETECTED Final    Comment: Performed at [facility], [address] [place], [place] [address]  Culture, blood (Routine X 2) w Reflex to ID Panel     Status: None   Collection Time: 06/07/22  6:58 AM   Specimen: BLOOD RIGHT HAND  Result Value Ref Range Status   Specimen Description BLOOD RIGHT HAND  Final   Special Requests   Final    BOTTLES DRAWN AEROBIC AND ANAEROBIC Blood Culture adequate volume   Culture   Final    NO GROWTH 5 DAYS Performed at [facility], [address] [place], [place] [address]    Report Status 06/12/2022 FINAL  Final  Culture, blood (Routine X 2) w Reflex to ID Panel     Status: None   Collection Time: 06/07/22  7:11 AM   Specimen: BLOOD LEFT HAND  Result Value Ref Range Status   Specimen Description BLOOD LEFT HAND  Final   Special Requests   Final    BOTTLES DRAWN AEROBIC AND ANAEROBIC Blood Culture adequate volume   Culture   Final    NO GROWTH 5 DAYS Performed at [facility], [address] [place], [place] [address]    Report Status 06/12/2022 FINAL  Final     Radiology Studies: ECHO TEE  Result Date: 06/12/2022    TRANSESOPHOGEAL ECHO REPORT   Patient Name:   George [name] Date of Exam: 06/12/2022 Medical Rec #:  [id number]     Height:       76.0 in Accession #:    [id number]    Weight:       230.6 lb Date of Birth:  [birth year]     BSA:          2.353 m Patient Age:    77 years      BP:           174/80 mmHg Patient Gender: M             HR:           85 bpm. Exam Location:  Inpatient Procedure: Transesophageal Echo Indications:     Endocarditis  History:         Patient has prior history of Echocardiogram examinations, most                  recent 05/28/2022. Previous Myocardial Infarction and CAD; Risk                  Factors:Diabetes and  Hypertension.  Sonographer:     Darlys Gales Referring Phys:  [id number] Perlie Gold Diagnosing Phys: Chilton Si MD PROCEDURE: After discussion of the risks and benefits of a TEE, an informed consent was obtained from the patient. The transesophogeal probe was passed without difficulty through the esophogus of the patient. Local oropharyngeal anesthetic was provided with Cetacaine. Sedation performed by different physician. The patient was monitored while under deep sedation. Anesthestetic sedation was provided intravenously by Anesthesiology: 182mg  of Propofol, 100mg  of Lidocaine. The patient's vital signs; including heart rate, blood pressure, and oxygen saturation; remained stable throughout the  procedure. The patient developed no complications during the procedure.  IMPRESSIONS  1. Left ventricular ejection fraction, by estimation, is 45 to 50%. The left ventricle has mildly decreased function. The left ventricle demonstrates global hypokinesis.  2. Right ventricular systolic function is normal. The right ventricular size is normal.  3. No left atrial/left atrial appendage thrombus was detected.  4. The mitral valve is normal in structure. Mild mitral valve regurgitation. No evidence of mitral stenosis.  5. Eccentric anteriorly directed aortic regurgitation. The aortic valve is tricuspid. Aortic valve regurgitation is mild to moderate. No aortic stenosis is present.  6. The inferior vena cava is normal in size with greater than 50% respiratory variability, suggesting right atrial pressure of 3 mmHg. Conclusion(s)/Recommendation(s): No evidence of vegetation/infective endocarditis on this transesophageael echocardiogram. FINDINGS  Left Ventricle: Left ventricular ejection fraction, by estimation, is 45 to 50%. The left ventricle has mildly decreased function. The left ventricle demonstrates global hypokinesis. The left ventricular internal cavity size was normal in size. There is  no left ventricular  hypertrophy. Right Ventricle: The right ventricular size is normal. No increase in right ventricular wall thickness. Right ventricular systolic function is normal. Left Atrium: Left atrial size was normal in size. No left atrial/left atrial appendage thrombus was detected. Right Atrium: Right atrial size was normal in size. Pericardium: There is no evidence of pericardial effusion. Mitral Valve: The mitral valve is normal in structure. Mild mitral valve regurgitation. No evidence of mitral valve stenosis. Tricuspid Valve: The tricuspid valve is normal in structure. Tricuspid valve regurgitation is not demonstrated. No evidence of tricuspid stenosis. Aortic Valve: Eccentric anteriorly directed aortic regurgitation. The aortic valve is tricuspid. Aortic valve regurgitation is mild to moderate. No aortic stenosis is present. Pulmonic Valve: The pulmonic valve was normal in structure. Pulmonic valve regurgitation is not visualized. No evidence of pulmonic stenosis. Aorta: The aortic root is normal in size and structure. Venous: The inferior vena cava is normal in size with greater than 50% respiratory variability, suggesting right atrial pressure of 3 mmHg. IAS/Shunts: No atrial level shunt detected by color flow Doppler. Additional Comments: Spectral Doppler performed. Chilton Si MD Electronically signed by Chilton Si MD Signature Date/Time: 06/12/2022/11:21:08 AM    Final    EP STUDY  Result Date: 06/12/2022 See surgical note for result.  [place] EKG SITE RITE  Result Date: 06/12/2022 If Site Rite image not attached, placement could not be confirmed due to current cardiac rhythm.   Scheduled Meds:  amLODipine  10 mg Oral Daily   aspirin EC  81 mg Oral Daily   atorvastatin  80 mg Oral Daily   butamben-tetracaine-benzocaine       carvedilol  12.5 mg Oral BID   docusate sodium  100 mg Oral BID   enoxaparin (LOVENOX) injection  40 mg Subcutaneous Q24H   insulin aspart  0-20 Units Subcutaneous TID  WC   insulin aspart  0-5 Units Subcutaneous QHS   insulin aspart  3 Units Subcutaneous TID WC   insulin glargine-yfgn  75 Units Subcutaneous BID   melatonin  3 mg Oral QHS   polyethylene glycol  17 g Oral Daily   sodium chloride flush  3 mL Intravenous Q12H   Continuous Infusions:   ceFAZolin (ANCEF) IV 2 g (06/12/22 0536)     LOS: 7 days   Hughie Closs, MD [facility]  06/12/2022, 12:14 PM   *Please note that this is a verbal dictation therefore any spelling or grammatical errors are due to the "  [employer]agon [employer] One" system interpretation.  Please page via Amion and do not message via secure chat for urgent patient care matters. Secure chat can be used for non urgent patient care matters.  How to contact the [facility] Attending or Consulting provider 7A - 7P or covering provider during after hours 7P -7A, for this patient?  Check the care team in [facility] and look for a) attending/consulting TRH provider listed and b) the [facility] team listed. Page or secure chat 7A-7P. Log into [email or web address] and use [place]'s universal password to access. If you do not have the password, please contact the hospital operator. Locate the [facility] provider you are looking for under [facility] and page to a number that you can be directly reached. If you still have difficulty reaching the provider, please page the [facility] (Director on Call) for the Hospitalists listed on amion for assistance.

## 2022-06-13 DIAGNOSIS — M0000 Staphylococcal arthritis, unspecified joint: Secondary | ICD-10-CM | POA: Diagnosis not present

## 2022-06-13 DIAGNOSIS — I214 Non-ST elevation (NSTEMI) myocardial infarction: Secondary | ICD-10-CM | POA: Diagnosis not present

## 2022-06-13 DIAGNOSIS — M25512 Pain in left shoulder: Secondary | ICD-10-CM | POA: Diagnosis not present

## 2022-06-13 DIAGNOSIS — E782 Mixed hyperlipidemia: Secondary | ICD-10-CM

## 2022-06-13 DIAGNOSIS — I10 Essential (primary) hypertension: Secondary | ICD-10-CM | POA: Diagnosis not present

## 2022-06-13 DIAGNOSIS — R7881 Bacteremia: Secondary | ICD-10-CM | POA: Diagnosis not present

## 2022-06-13 LAB — CBC WITH DIFFERENTIAL/PLATELET
Abs Immature Granulocytes: 0.11 10*3/uL — ABNORMAL HIGH (ref 0.00–0.07)
Basophils Absolute: 0 10*3/uL (ref 0.0–0.1)
Basophils Relative: 1 %
Eosinophils Absolute: 0.3 10*3/uL (ref 0.0–0.5)
Eosinophils Relative: 4 %
HCT: 33.9 % — ABNORMAL LOW (ref 39.0–52.0)
Hemoglobin: 10.9 g/dL — ABNORMAL LOW (ref 13.0–17.0)
Immature Granulocytes: 1 %
Lymphocytes Relative: 13 %
Lymphs Abs: 1 10*3/uL (ref 0.7–4.0)
MCH: 26.5 pg (ref 26.0–34.0)
MCHC: 32.2 g/dL (ref 30.0–36.0)
MCV: 82.3 fL (ref 80.0–100.0)
Monocytes Absolute: 0.7 10*3/uL (ref 0.1–1.0)
Monocytes Relative: 9 %
Neutro Abs: 5.7 10*3/uL (ref 1.7–7.7)
Neutrophils Relative %: 72 %
Platelets: 328 10*3/uL (ref 150–400)
RBC: 4.12 MIL/uL — ABNORMAL LOW (ref 4.22–5.81)
RDW: 13.6 % (ref 11.5–15.5)
WBC: 7.9 10*3/uL (ref 4.0–10.5)
nRBC: 0 % (ref 0.0–0.2)

## 2022-06-13 LAB — BASIC METABOLIC PANEL
Anion gap: 10 (ref 5–15)
BUN: 10 mg/dL (ref 8–23)
CO2: 29 mmol/L (ref 22–32)
Calcium: 8.4 mg/dL — ABNORMAL LOW (ref 8.9–10.3)
Chloride: 94 mmol/L — ABNORMAL LOW (ref 98–111)
Creatinine, Ser: 0.76 mg/dL (ref 0.61–1.24)
GFR, Estimated: >60 mL/min (ref >60)
Glucose, Bld: 124 mg/dL — ABNORMAL HIGH (ref 70–99)
Potassium: 3.9 mmol/L (ref 3.5–5.1)
Sodium: 133 mmol/L — ABNORMAL LOW (ref 135–145)

## 2022-06-13 LAB — GLUCOSE, CAPILLARY
Glucose-Capillary: 109 mg/dL — ABNORMAL HIGH (ref 70–99)
Glucose-Capillary: 215 mg/dL — ABNORMAL HIGH (ref 70–99)

## 2022-06-13 MED ORDER — LISINOPRIL 10 MG PO TABS: 10.0000 mg | ORAL_TABLET | Freq: Every day | ORAL | 3 refills | Status: DC

## 2022-06-13 MED ORDER — OXYCODONE HCL 5 MG PO TABS: 5.0000 mg | ORAL_TABLET | Freq: Four times a day (QID) | ORAL | 0 refills | Status: AC | PRN

## 2022-06-13 MED ORDER — CARVEDILOL 12.5 MG PO TABS: 12.5000 mg | ORAL_TABLET | Freq: Two times a day (BID) | ORAL | 3 refills | Status: DC

## 2022-06-13 MED ORDER — ATORVASTATIN CALCIUM 80 MG PO TABS: 80.0000 mg | ORAL_TABLET | Freq: Every day | ORAL | 3 refills | Status: AC

## 2022-06-13 MED ORDER — CEFAZOLIN IV (FOR PTA / DISCHARGE USE ONLY)
2.0000 g | Freq: Three times a day (TID) | INTRAVENOUS | 0 refills | Status: AC
Start: 2022-06-13 — End: 2022-07-20

## 2022-06-13 NOTE — Progress Notes (Signed)
Pts BS was 109 this morning, instructed by MD to hold long acting insulin. Pt ate less than 50% of this breakfast

## 2022-06-13 NOTE — TOC Progression Note (Signed)
Transition of Care [facility]) - Progression Note    Patient Details  Name: ANDREA GASTON MRN: [id number] Date of Birth: 01-02-1946  [facility] of [facility] CM/SW Contact  Leone Haven, RN Phone Number: 06/13/2022, 1:41 PM  Clinical Narrative:    NCM notified by CSW that PT has changed recs to HHPT and patient will be going home.  NCM spoke with patient and wife at the bedside.  Offered choice for [facility] for IV abx.  They do not have a preference.  NCM informed Jeri Modena regarding the IV medication.  She will make referral to [facility] or [facility] for the [facility] for IV ABX.       Expected Discharge Plan: Skilled Nursing Facility    Expected Discharge Plan and Services       Living arrangements for the past 2 months: Single Family Home                                       Social Determinants of Health (SDOH) Interventions SDOH Screenings   Food Insecurity: No Food Insecurity (06/06/2022)  Housing: Patient Declined (06/06/2022)  Transportation Needs: No Transportation Needs (06/06/2022)  Utilities: Not At Risk (06/06/2022)  Tobacco Use: Medium Risk (06/12/2022)    Readmission Risk Interventions     No data to display

## 2022-06-13 NOTE — Discharge Summary (Signed)
Physician Discharge Summary   Patient: George [name] MRN: [id number] DOB: 14-Sep-1945  Admit date:     06/05/2022  Discharge date: 06/13/22  Discharge Physician: Meredeth Ide   PCP: Kirstie Peri, MD   Recommendations at discharge:   Follow-up CT surgery as outpatient Follow-up PCP as outpatient Patient will get home health set up at home for IV antibiotics Continue cefazolin till 07/19/2022 Will need MRI of cervical spine in 3 weeks Follow-up ID clinic on 06/26/2022 Follow-up orthopedics as outpatient for left shoulder evaluation  Discharge Diagnoses: Principal Problem:   MSSA bacteremia Active Problems:   HTN (hypertension)   Uncontrolled type 2 diabetes mellitus with hyperglycemia (HCC)   Mixed hyperlipidemia   NSTEMI (non-ST elevated myocardial infarction) (HCC)   Sepsis due to methicillin susceptible Staphylococcus aureus (MSSA) without acute organ dysfunction (HCC)   Acute pain of left shoulder   Staphylococcal arthritis (HCC)  Resolved Problems:   * No resolved hospital problems. *  Hospital Course:  77 y.o. male with past medical history of AAA, chronic pain, CAD s/p CABG, HTN, HLD, DM, chronic systolic CHF, and prostate CA who presented with chest pain. Recent stress test on 05/08/22 showed prior infarction and was intermediate risk with EF 30-44%. He was admitted under cardiology with NSTEMI with plan for cardiac catheterization. he spiked a fever to 102. His only complaint is L shoulder pain that radiates into his L neck and jaw and into his L chest. Hospitalist were consulted for that. Eventually Ortho and ID were consulted.   Assessment and Plan:    NSTEMI (non-ST elevated myocardial infarction) (HCC) ruled out -Patient with prior h/o CAD s/p CABG presenting with chest pain and uptrending troponin, initially thought to be consistent with NSTEMI.   he was started on heparin drip and plan was cardiac cath but then it was opined by cardiology that there is very low  concern of ACS so heparin was discontinued and no plan for cardiac cath and cardiology signed off on 06/07/2022.   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   Complained of neck pain and left shoulder pain.  Ortho was consulted.   -S/p  arthrocentesis of the left shoulder but it was a dry tap.   MRI cervical spine shows some concern of possible C4 discitis but no infection in the left shoulder.   MRI left shoulder showed multiple torn ligaments.  Will need outpatient follow-up orthopedics once infection is eradicated and antibiotics completed. Repeat  MRI of the cervical spine to be done by ID in 2 weeks.  Patient has appointment with ID on 06/26/2022.   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  Seen by cardiothoracic surgery, antibiotics recommended.   -CT surgery to follow as outpatient  -underwent TEE 06/12/2022, endocarditis ruled out.  Antibiotics per ID.   Mixed hyperlipidemia -Continue high-dose Lipitor   Uncontrolled type 2 diabetes mellitus with hyperglycemia (HCC) Hemoglobin A1c 8.6.   -Continue home regimen   HTN (hypertension) -Blood pressure was stable but is elevated today.  Continue amlodipine and carvedilol (was increased to 12.5 mg p.o. twice daily by cardiology).  -He was on Catapres which was not restarted -Started on lisinopril 10 mg p.o. daily   Hypokalemia:  -Replete   Generalized weakness/deconditioning:  -Patient to go home with home health PT/ OT      Consultants: ID, CT surgery, cardiology Procedures performed:  Disposition: Home Diet recommendation:  Discharge Diet Orders (From admission, onward)  Start     Ordered   06/13/22 0000  Diet - low sodium heart healthy        06/13/22 1435           Carb modified diet DISCHARGE MEDICATION: Allergies as of 06/13/2022   No Known Allergies      Medication List     STOP taking these medications     cloNIDine 0.1 MG tablet Commonly known as: CATAPRES   metoprolol succinate 100 MG 24 hr tablet Commonly known as: TOPROL-XL       TAKE these medications    amLODipine 10 MG tablet Commonly known as: NORVASC Take 1 tablet (10 mg total) by mouth daily.   atorvastatin 80 MG tablet Commonly known as: LIPITOR Take 1 tablet (80 mg total) by mouth daily. Start taking on: Jun 14, 2022 What changed:  medication strength how much to take   carvedilol 12.5 MG tablet Commonly known as: COREG Take 1 tablet (12.5 mg total) by mouth 2 (two) times daily. What changed:  medication strength how much to take   ceFAZolin  IVPB Commonly known as: ANCEF Inject 2 g into the vein every 8 (eight) hours. Indication:  MSSA bacteremia/ [place] joint osteo First Dose: Yes Last Day of Therapy:  07/19/22 Labs - Once weekly:  CBC/D and BMP, Labs - Once weekly: ESR and CRP Method of administration: IV Push Method of administration may be changed at the discretion of home infusion pharmacist based upon assessment of the patient and/or caregiver's ability to self-administer the medication ordered.   ICAPS AREDS 2 PO Take 1 capsule by mouth 2 (two) times daily.   insulin glargine 100 UNIT/ML Solostar Pen Commonly known as: LANTUS Inject 70 Units into the skin at bedtime.   lisinopril 10 MG tablet Commonly known as: ZESTRIL Take 1 tablet (10 mg total) by mouth daily.   nitroGLYCERIN 0.4 MG SL tablet Commonly known as: NITROSTAT Place 1 tablet (0.4 mg total) under the tongue every 5 (five) minutes x 3 doses as needed for chest pain (if no relief after 3rd dose, proceed to ED for evaluation or call 911).   NovoLOG FlexPen 100 UNIT/ML FlexPen Generic drug: insulin aspart INJECT 25-31 UNITS INTO THE SKIN THREE TIMES DAILY BEFORE MEALS What changed: See the new instructions.   oxyCODONE 5 MG immediate release tablet Commonly known as: Oxy IR/ROXICODONE Take 1 tablet (5 mg total) by mouth every 6  (six) hours as needed for up to 5 days for breakthrough pain.               Home Infusion Instuctions  (From admission, onward)           Start     Ordered   06/13/22 0000  Home infusion instructions       Question:  Instructions  Answer:  Flushing of vascular access device: 0.9% NaCl pre/post medication administration and prn patency; Heparin 100 u/ml, 5ml for implanted ports and Heparin 10u/ml, 5ml for all other central venous catheters.   06/13/22 1433            Follow-up Information     Kirstie Peri, MD. Go on 06/19/2022.   Specialty: Internal Medicine Why: @4 :00pm Contact information: [address]  [place] [address] [phone]                Discharge Exam: Filed Weights   06/11/22 0420 06/12/22 0509 06/13/22 0400  Weight: 106.3 kg 104.6 kg 104.3 kg   General-appears in  no acute distress Heart-S1-S2, regular, no murmur auscultated Lungs-clear to auscultation bilaterally, no wheezing or crackles auscultated Abdomen-soft, nontender, no organomegaly Extremities-no edema in the lower extremities Neuro-alert, oriented x3, no focal deficit noted  Condition at discharge: good  The results of significant diagnostics from this hospitalization (including imaging, microbiology, ancillary and laboratory) are listed below for reference.   Imaging Studies: ECHO TEE  Result Date: 06/12/2022    TRANSESOPHOGEAL ECHO REPORT   Patient Name:   George [name] Date of Exam: 06/12/2022 Medical Rec #:  [id number]     Height:       76.0 in Accession #:    [id number]    Weight:       230.6 lb Date of Birth:  11/01/1945     BSA:          2.353 m Patient Age:    77 years      BP:           174/80 mmHg Patient Gender: M             HR:           85 bpm. Exam Location:  Inpatient Procedure: Transesophageal Echo Indications:     Endocarditis  History:         Patient has prior history of Echocardiogram examinations, most                  recent 05/28/2022. Previous Myocardial  Infarction and CAD; Risk                  Factors:Diabetes and Hypertension.  Sonographer:     Darlys Gales Referring Phys:  [id number] Perlie Gold Diagnosing Phys: Chilton Si MD PROCEDURE: After discussion of the risks and benefits of a TEE, an informed consent was obtained from the patient. The transesophogeal probe was passed without difficulty through the esophogus of the patient. Local oropharyngeal anesthetic was provided with Cetacaine. Sedation performed by different physician. The patient was monitored while under deep sedation. Anesthestetic sedation was provided intravenously by Anesthesiology: 182mg  of Propofol, 100mg  of Lidocaine. The patient's vital signs; including heart rate, blood pressure, and oxygen saturation; remained stable throughout the procedure. The patient developed no complications during the procedure.  IMPRESSIONS  1. Left ventricular ejection fraction, by estimation, is 45 to 50%. The left ventricle has mildly decreased function. The left ventricle demonstrates global hypokinesis.  2. Right ventricular systolic function is normal. The right ventricular size is normal.  3. No left atrial/left atrial appendage thrombus was detected.  4. The mitral valve is normal in structure. Mild mitral valve regurgitation. No evidence of mitral stenosis.  5. Eccentric anteriorly directed aortic regurgitation. The aortic valve is tricuspid. Aortic valve regurgitation is mild to moderate. No aortic stenosis is present.  6. The inferior vena cava is normal in size with greater than 50% respiratory variability, suggesting right atrial pressure of 3 mmHg. Conclusion(s)/Recommendation(s): No evidence of vegetation/infective endocarditis on this transesophageael echocardiogram. FINDINGS  Left Ventricle: Left ventricular ejection fraction, by estimation, is 45 to 50%. The left ventricle has mildly decreased function. The left ventricle demonstrates global hypokinesis. The left ventricular internal cavity  size was normal in size. There is  no left ventricular hypertrophy. Right Ventricle: The right ventricular size is normal. No increase in right ventricular wall thickness. Right ventricular systolic function is normal. Left Atrium: Left atrial size was normal in size. No left atrial/left atrial appendage thrombus was detected. Right Atrium: Right atrial size was normal  in size. Pericardium: There is no evidence of pericardial effusion. Mitral Valve: The mitral valve is normal in structure. Mild mitral valve regurgitation. No evidence of mitral valve stenosis. Tricuspid Valve: The tricuspid valve is normal in structure. Tricuspid valve regurgitation is not demonstrated. No evidence of tricuspid stenosis. Aortic Valve: Eccentric anteriorly directed aortic regurgitation. The aortic valve is tricuspid. Aortic valve regurgitation is mild to moderate. No aortic stenosis is present. Pulmonic Valve: The pulmonic valve was normal in structure. Pulmonic valve regurgitation is not visualized. No evidence of pulmonic stenosis. Aorta: The aortic root is normal in size and structure. Venous: The inferior vena cava is normal in size with greater than 50% respiratory variability, suggesting right atrial pressure of 3 mmHg. IAS/Shunts: No atrial level shunt detected by color flow Doppler. Additional Comments: Spectral Doppler performed. Chilton Si MD Electronically signed by Chilton Si MD Signature Date/Time: 06/12/2022/11:21:08 AM    Final    EP STUDY  Result Date: 06/12/2022 See surgical note for result.  [place] EKG SITE RITE  Result Date: 06/12/2022 If Site Rite image not attached, placement could not be confirmed due to current cardiac rhythm.  MR STERNUM W WO CONTRAST  Result Date: 06/11/2022 CLINICAL DATA:  MSSA bacteremia with pain and tenderness over the left sternoclavicular joint. Possible C4 discitis. EXAM: MR STERNUM WITHOUT AND WITH CONTRAST TECHNIQUE: Multiplanar imaging of the anterior chest was  performed with attention to the sternal and sternoclavicular joint. Study includes imaging following intravenous administration of contrast. CONTRAST:  10mL GADAVIST GADOBUTROL 1 MMOL/ML IV SOLN COMPARISON:  Chest CT 06/05/2022. FINDINGS: Bones/Joint/Cartilage Status post median sternotomy with associated susceptibility artifact. Allowing for this artifact and correlated with the recent CT, the sternotomy appears healed. There is no suspicious marrow signal abnormality or enhancement within the lower sternum. However, there are inflammatory changes within the soft tissue surrounding the left sternoclavicular joint which have progressed from the recent CT. There is low-level marrow edema within the left aspect of the sternal manubrium and the inferior aspect of the left clavicular head. No cortical destruction identified. There is moderate irregular synovial thickening at the left sternoclavicular joint without drainable fluid collection. The right sternoclavicular joint appears unremarkable. Ligaments No ligamentous abnormalities are identified. Muscles and Tendons Mild edema superior medially in the left pectoralis major muscle without focal fluid collection or atrophy. Soft tissue As above, there is inflammation in the soft tissues surrounding the left sternoclavicular joint associated with probable underlying synovitis. These findings appear mildly progressive compared with the recent chest CT. The inflammatory changes extend superiorly, but do not exert any significant mass effect on the mediastinal structures. There is no focal drainable fluid collection. IMPRESSION: 1. Progressive inflammatory changes surrounding the left sternoclavicular joint compared with recent chest CT. There is low-level marrow edema within the left aspect of the sternal manubrium and the inferior aspect of the left clavicular head without cortical destruction or drainable fluid collection. These findings are suspicious for septic  arthritis and early osteomyelitis in this clinical setting. Differential includes other inflammatory and crystalline arthropathies. 2. The right sternoclavicular joint appears unremarkable. 3. Status post median sternotomy. Electronically Signed   By: Carey Bullocks M.D.   On: 06/11/2022 08:25   MR CERVICAL SPINE W WO CONTRAST  Result Date: 06/07/2022 CLINICAL DATA:  Neck pain, infection suspected, no prior imaging concern for vertebral infection with MSSA bacteremia and severe new neck pain x 1 week; Mid-back pain, infection suspected, no prior imaging concern for vertebral infection with MSSA bacteremia and  severe new neck pain x 1 week EXAM: MRI CERVICAL AND THORACIC SPINE WITHOUT AND WITH CONTRAST TECHNIQUE: Multiplanar and multiecho pulse sequences of the cervical spine, to include the craniocervical junction and cervicothoracic junction, and the thoracic spine, were obtained without and with intravenous contrast. CONTRAST:  10mL GADAVIST GADOBUTROL 1 MMOL/ML IV SOLN COMPARISON:  None Available. FINDINGS: MRI CERVICAL SPINE FINDINGS Limitations: Assessment is slightly limited due to motion artifact. Alignment: There is straightening of the normal cervical lordosis. Vertebrae: There is minimal T2/stir hyperintense signal within the anterior osteophytes at C4 (series 5, image 7). There is also very contrast enhancement associated with this region (series 19, image 6-8). There is no abnormal signal in the intervertebral discs. Cord: Normal signal and morphology. Posterior Fossa, vertebral arteries, paraspinal tissues: There is nonspecific. Vertebral soft tissue edema spanning the C3-C5 vertebral bodies (series 5, image 9). There is also mild contrast enhancement in the region (series 19, image 9). Disc levels: There is moderate to severe spinal canal narrowing at C5-C6 secondary to a combination ligamentum flavum hypertrophy and a degenerative disc bulge MRI THORACIC SPINE FINDINGS Alignment:  Physiologic.  Vertebrae: No fracture, evidence of discitis, or bone lesion. There is an osseous hemangioma at T8 Cord:  Normal signal and morphology. Paraspinal and other soft tissues: There is a T2 hyperintense lesion along the left lateral aspect of the T6 vertebral body (series 12, image 13) measuring 1.3 x 1.1 cm. Disc levels: No evidence of high-grade spinal canal stenosis. IMPRESSION: 1. Minimal T2/STIR hyperintense signal within the anterior osteophytes at C4 with associated contrast enhancement. This is favored to either be degenerative or post-traumatic in nature. Correlate for history of trauma. If there is no history of trauma early osteomyelitis/discitis cannot be excluded and a short term follow up MRI is recommended in 3 weeks to assess for interval change. 2. No evidence of discitis/osteomyelitis in the thoracic spine. 3. Moderate to severe spinal canal narrowing at C5-C6 secondary to a combination of ligamentum flavum hypertrophy and a degenerative disc bulge. 4. T2 hyperintense lesion along the left lateral aspect of the T6 vertebral body measuring 1.3 x 1.1 cm. This is favored to represent a lymph node or a sympathetic chain ganglion. Recommend attention on follow up. Electronically Signed   By: Lorenza Cambridge M.D.   On: 06/07/2022 16:22   MR THORACIC SPINE W WO CONTRAST  Result Date: 06/07/2022 CLINICAL DATA:  Neck pain, infection suspected, no prior imaging concern for vertebral infection with MSSA bacteremia and severe new neck pain x 1 week; Mid-back pain, infection suspected, no prior imaging concern for vertebral infection with MSSA bacteremia and severe new neck pain x 1 week EXAM: MRI CERVICAL AND THORACIC SPINE WITHOUT AND WITH CONTRAST TECHNIQUE: Multiplanar and multiecho pulse sequences of the cervical spine, to include the craniocervical junction and cervicothoracic junction, and the thoracic spine, were obtained without and with intravenous contrast. CONTRAST:  10mL GADAVIST GADOBUTROL 1 MMOL/ML  IV SOLN COMPARISON:  None Available. FINDINGS: MRI CERVICAL SPINE FINDINGS Limitations: Assessment is slightly limited due to motion artifact. Alignment: There is straightening of the normal cervical lordosis. Vertebrae: There is minimal T2/stir hyperintense signal within the anterior osteophytes at C4 (series 5, image 7). There is also very contrast enhancement associated with this region (series 19, image 6-8). There is no abnormal signal in the intervertebral discs. Cord: Normal signal and morphology. Posterior Fossa, vertebral arteries, paraspinal tissues: There is nonspecific. Vertebral soft tissue edema spanning the C3-C5 vertebral bodies (series 5, image 9).  There is also mild contrast enhancement in the region (series 19, image 9). Disc levels: There is moderate to severe spinal canal narrowing at C5-C6 secondary to a combination ligamentum flavum hypertrophy and a degenerative disc bulge MRI THORACIC SPINE FINDINGS Alignment:  Physiologic. Vertebrae: No fracture, evidence of discitis, or bone lesion. There is an osseous hemangioma at T8 Cord:  Normal signal and morphology. Paraspinal and other soft tissues: There is a T2 hyperintense lesion along the left lateral aspect of the T6 vertebral body (series 12, image 13) measuring 1.3 x 1.1 cm. Disc levels: No evidence of high-grade spinal canal stenosis. IMPRESSION: 1. Minimal T2/STIR hyperintense signal within the anterior osteophytes at C4 with associated contrast enhancement. This is favored to either be degenerative or post-traumatic in nature. Correlate for history of trauma. If there is no history of trauma early osteomyelitis/discitis cannot be excluded and a short term follow up MRI is recommended in 3 weeks to assess for interval change. 2. No evidence of discitis/osteomyelitis in the thoracic spine. 3. Moderate to severe spinal canal narrowing at C5-C6 secondary to a combination of ligamentum flavum hypertrophy and a degenerative disc bulge. 4. T2  hyperintense lesion along the left lateral aspect of the T6 vertebral body measuring 1.3 x 1.1 cm. This is favored to represent a lymph node or a sympathetic chain ganglion. Recommend attention on follow up. Electronically Signed   By: Lorenza Cambridge M.D.   On: 06/07/2022 16:22   MR SHOULDER LEFT WO CONTRAST  Result Date: 06/07/2022 CLINICAL DATA:  Chronic shoulder pain. Rotator cuff disorder suspected. EXAM: MRI OF THE LEFT SHOULDER WITHOUT CONTRAST TECHNIQUE: Multiplanar, multisequence MR imaging of the shoulder was performed. No intravenous contrast was administered. COMPARISON:  None Available. FINDINGS: Rotator cuff: There is moderate intermediate T2 signal and thickening tendinosis of the anterior greater than mid AP dimension of the supraspinatus tendon footprint. There is fluid bright signal indicating a partial-thickness tear of the mid to anterior supraspinatus tendon in a region measuring up to approximately 14 mm in AP dimension (sagittal series 6 images 4 and 5). Posteriorly, this tear is a midsubstance tear and anteriorly it extends through the bursal tendon surface where it appears to measure up to 6 mm in AP dimension. No significant tendon retraction. Punctate 2-3 mm midsubstance tear within the anterior infraspinatus tendon insertion. Mild partial-thickness articular sided tear of the superior subscapularis tendon insertion. The teres minor is intact. Muscles: No rotator cuff muscle atrophy, fatty infiltration, or edema. Biceps long head: There is mild-to-moderate intermediate T2 signal and thickening tendinosis of the proximal long head of the biceps tendon. Acromioclavicular Joint: There are mild degenerative changes of the acromioclavicular joint including joint space narrowing, subchondral marrow edema, and peripheral osteophytosis. Type III acromion, with minimal downsloping of the anterolateral acromion. Mild fluid within the subacromial/subdeltoid bursa. Glenohumeral Joint: Moderate  thinning of the glenohumeral cartilage. No joint effusion. Labrum: Grossly intact, but evaluation is limited by lack of intraarticular fluid. Bones:  No acute fracture. Other: There is mild-to-moderate fluid tracking along the fascial planes of the superficial posterior aspect of the infraspinatus and teres minor, deep to the posterior deltoid muscle. There is moderate edema throughout the adjacent posterior aspect of the deltoid musculature (coronal series 3 images 1 through 6) overlying this fluid, and there is scattered decreased T1 and decreased T2 signal within the deltoid muscle edema, possibly from evolving subacute to chronic blood products. IMPRESSION: 1. Moderate anterior greater than mid supraspinatus tendinosis. Partial-thickness tear of the mid  to anterior supraspinatus tendon measuring up to 14 mm in AP dimension. 2. Punctate 2-3 mm midsubstance tear within the anterior infraspinatus tendon insertion. 3. Mild partial-thickness articular sided tear of the superior subscapularis tendon insertion. 4. Mild-to-moderate proximal long head of the biceps tendinosis. 5. Mild degenerative changes of the acromioclavicular joint. 6. Overall there appears to be an interstitial tear of the posterior deltoid muscle with moderate edema and possible mild blood products interposed within the muscle fibers. There is mild to moderate fluid tracking between the posterior deltoid muscle and the infraspinatus and teres minor muscles. Electronically Signed   By: Neita Garnet M.D.   On: 06/07/2022 14:33   DG Shoulder Left Port  Result Date: 06/06/2022 CLINICAL DATA:  Fever.  Pain. EXAM: LEFT SHOULDER COMPARISON:  None Available. FINDINGS: Single frontal view of the left shoulder. Mild acromioclavicular joint space narrowing and peripheral osteophytosis. Mild inferior glenohumeral joint space narrowing and peripheral osteophytosis. No acute fracture is seen. No dislocation. Status post median sternotomy. IMPRESSION: Mild  acromioclavicular and glenohumeral osteoarthritis. Electronically Signed   By: Neita Garnet M.D.   On: 06/06/2022 13:20   DG CHEST PORT 1 VIEW  Result Date: 06/05/2022 CLINICAL DATA:  Fever. EXAM: PORTABLE CHEST 1 VIEW COMPARISON:  Two-view chest x-ray 08/12/2018 FINDINGS: Heart is enlarged. No edema or effusion is present. No focal airspace disease is present. Mild degenerative changes are again noted in the shoulders. Visualized soft tissues and bony thorax are unremarkable. IMPRESSION: 1. Cardiomegaly without failure. 2. No acute cardiopulmonary disease. Electronically Signed   By: Marin Roberts M.D.   On: 06/05/2022 21:49   ECHOCARDIOGRAM COMPLETE  Result Date: 05/29/2022    ECHOCARDIOGRAM REPORT   Patient Name:   George [name] Date of Exam: 05/28/2022 Medical Rec #:  [id number]     Height:       76.0 in Accession #:    [id number]    Weight:       239.0 lb Date of Birth:  Nov 18, 1945     BSA:          2.389 m Patient Age:    77 years      BP:           148/72 mmHg Patient Gender: M             HR:           68 bpm. Exam Location:  [facility] Procedure: 2D Echo, Cardiac Doppler, Color Doppler and Intracardiac            Opacification Agent Indications:    R06.9 DOE  History:        Patient has prior history of Echocardiogram examinations, most                 recent 07/05/2020. CHF, CAD, Prior CABG, Dilated ascending aorta,                 Signs/Symptoms:Dyspnea; Risk Factors:Hypertension, Diabetes,                 Dyslipidemia and Former Smoker.  Sonographer:    Jake Seats RDMS, RVT, RDCS Referring Phys: [id number] [facility]  Sonographer Comments: Global longitudinal strain was attempted. IMPRESSIONS  1. Left ventricular ejection fraction, by estimation, is 50%. The left ventricle has mildly decreased function. Left ventricular endocardial border not optimally defined to evaluate regional wall motion. There is moderate left ventricular hypertrophy. Left ventricular diastolic parameters are  indeterminate. Elevated left atrial pressure.  2. Right ventricular  systolic function is normal. The right ventricular size is normal. Tricuspid regurgitation signal is inadequate for assessing PA pressure.  3. The mitral valve is abnormal. Mild mitral valve regurgitation. No evidence of mitral stenosis.  4. The aortic valve is tricuspid. There is mild calcification of the aortic valve. There is mild thickening of the aortic valve. Aortic valve regurgitation is trivial. No aortic stenosis is present.  5. Aortic dilatation noted. There is mild dilatation of the aortic root, measuring 42 mm. There is mild dilatation of the ascending aorta, measuring 40 mm. Comparison(s): Echocardiogram done 07/05/20 showed an EF of 40-45%. FINDINGS  Left Ventricle: Left ventricular ejection fraction, by estimation, is 50%. The left ventricle has mildly decreased function. Left ventricular endocardial border not optimally defined to evaluate regional wall motion. Definity contrast agent was given IV  to delineate the left ventricular endocardial borders. Global longitudinal strain performed but not reported based on interpreter judgement due to suboptimal tracking. The left ventricular internal cavity size was normal in size. There is moderate left ventricular hypertrophy. Left ventricular diastolic parameters are indeterminate. Elevated left atrial pressure. Right Ventricle: The right ventricular size is normal. Right vetricular wall thickness was not well visualized. Right ventricular systolic function is normal. Tricuspid regurgitation signal is inadequate for assessing PA pressure. Left Atrium: Left atrial size was normal in size. Right Atrium: Right atrial size was normal in size. Pericardium: There is no evidence of pericardial effusion. Mitral Valve: The mitral valve is abnormal. Mild mitral valve regurgitation. No evidence of mitral valve stenosis. Tricuspid Valve: The tricuspid valve is normal in structure. Tricuspid valve  regurgitation is trivial. No evidence of tricuspid stenosis. Aortic Valve: The aortic valve is tricuspid. There is mild calcification of the aortic valve. There is mild thickening of the aortic valve. There is mild aortic valve annular calcification. Aortic valve regurgitation is trivial. Aortic regurgitation PHT measures 1063 msec. No aortic stenosis is present. Aortic valve mean gradient measures 3.5 mmHg. Aortic valve peak gradient measures 5.3 mmHg. Aortic valve area, by VTI measures 4.64 cm. Pulmonic Valve: The pulmonic valve was not well visualized. Pulmonic valve regurgitation is mild. No evidence of pulmonic stenosis. Aorta: Aortic dilatation noted. There is mild dilatation of the aortic root, measuring 42 mm. There is mild dilatation of the ascending aorta, measuring 40 mm. IAS/Shunts: The interatrial septum was not well visualized.  LEFT VENTRICLE PLAX 2D LVIDd:         5.00 cm      Diastology LVIDs:         3.80 cm      LV e' medial:    5.77 cm/s LV PW:         1.30 cm      LV E/e' medial:  18.7 LV IVS:        1.30 cm      LV e' lateral:   8.49 cm/s LVOT diam:     2.40 cm      LV E/e' lateral: 12.7 LV SV:         112 LV SV Index:   47           2D Longitudinal Strain LVOT Area:     4.52 cm     2D Strain GLS (A2C):   -13.3 %                             2D Strain GLS (A3C):   -16.7 %  2D Strain GLS (A4C):   -13.2 % LV Volumes (MOD)            2D Strain GLS Avg:     -14.4 % LV vol d, MOD A2C: 175.0 ml LV vol d, MOD A4C: 164.0 ml LV vol s, MOD A2C: 93.2 ml LV vol s, MOD A4C: 84.1 ml  3D Volume EF: LV SV MOD A2C:     81.8 ml  3D EF:        52 % LV SV MOD A4C:     164.0 ml LV EDV:       327 ml LV SV MOD BP:      90.1 ml  LV ESV:       156 ml                             LV SV:        172 ml RIGHT VENTRICLE RV S prime:     8.38 cm/s TAPSE (M-mode): 1.4 cm LEFT ATRIUM             Index        RIGHT ATRIUM           Index LA diam:        4.20 cm 1.76 cm/m   RA Area:     14.30 cm LA  Vol (A2C):   57.0 ml 23.85 ml/m  RA Volume:   34.50 ml  14.44 ml/m LA Vol (A4C):   65.5 ml 27.41 ml/m LA Biplane Vol: 61.5 ml 25.74 ml/m  AORTIC VALVE                    PULMONIC VALVE AV Area (Vmax):    4.71 cm     PR End Diast Vel: 3.50 msec AV Area (Vmean):   4.53 cm AV Area (VTI):     4.64 cm AV Vmax:           115.33 cm/s AV Vmean:          84.350 cm/s AV VTI:            0.242 m AV Peak Grad:      5.3 mmHg AV Mean Grad:      3.5 mmHg LVOT Vmax:         120.00 cm/s LVOT Vmean:        84.500 cm/s LVOT VTI:          0.248 m LVOT/AV VTI ratio: 1.02 AI PHT:            1063 msec  AORTA Ao Root diam: 4.20 cm Ao Asc diam:  4.00 cm MITRAL VALVE MV Area (PHT): 4.29 cm     SHUNTS MV Decel Time: 177 msec     Systemic VTI:  0.25 m MR Peak grad: 67.1 mmHg     Systemic Diam: 2.40 cm MR Vmax:      409.50 cm/s MV E velocity: 108.00 cm/s MV A velocity: 69.80 cm/s MV E/A ratio:  1.55 Dina Rich MD Electronically signed by Dina Rich MD Signature Date/Time: 05/29/2022/10:22:47 AM    Final     Microbiology: Results for orders placed or performed during the hospital encounter of 06/05/22  Culture, blood (Routine X 2) w Reflex to ID Panel     Status: Abnormal   Collection Time: 06/05/22 10:26 PM   Specimen: BLOOD LEFT HAND  Result Value Ref Range Status   Specimen  Description BLOOD LEFT HAND  Final   Special Requests NONE  Final   Culture  Setup Time   Final    GRAM POSITIVE COCCI IN CLUSTERS IN BOTH AEROBIC AND ANAEROBIC BOTTLES CRITICAL VALUE NOTED.  VALUE IS CONSISTENT WITH PREVIOUSLY REPORTED AND CALLED VALUE.    Culture (A)  Final    STAPHYLOCOCCUS AUREUS SUSCEPTIBILITIES PERFORMED ON PREVIOUS CULTURE WITHIN THE LAST 5 DAYS. Performed at [facility], [address] [place], [place] [address]    Report Status 06/08/2022 FINAL  Final  Culture, blood (Routine X 2) w Reflex to ID Panel     Status: Abnormal   Collection Time: 06/05/22 10:26 PM   Specimen: BLOOD RIGHT HAND  Result Value  Ref Range Status   Specimen Description BLOOD RIGHT HAND  Final   Special Requests   Final    BOTTLES DRAWN AEROBIC AND ANAEROBIC Blood Culture adequate volume   Culture  Setup Time   Final    GRAM POSITIVE COCCI IN CLUSTERS IN BOTH AEROBIC AND ANAEROBIC BOTTLES CRITICAL RESULT CALLED TO, READ BACK BY AND VERIFIED WITH: PHARMD A PAYTES [id number] AT 1325 BY CM Performed at [facility], [address], [place], [place] [address]    Culture STAPHYLOCOCCUS AUREUS (A)  Final   Report Status 06/08/2022 FINAL  Final   Organism ID, Bacteria STAPHYLOCOCCUS AUREUS  Final      Susceptibility   Staphylococcus aureus - MIC*    CIPROFLOXACIN <=0.5 SENSITIVE Sensitive     ERYTHROMYCIN <=0.25 SENSITIVE Sensitive     GENTAMICIN <=0.5 SENSITIVE Sensitive     OXACILLIN 0.5 SENSITIVE Sensitive     TETRACYCLINE <=1 SENSITIVE Sensitive     VANCOMYCIN <=0.5 SENSITIVE Sensitive     TRIMETH/SULFA <=10 SENSITIVE Sensitive     CLINDAMYCIN <=0.25 SENSITIVE Sensitive     RIFAMPIN <=0.5 SENSITIVE Sensitive     Inducible Clindamycin NEGATIVE Sensitive     LINEZOLID 2 SENSITIVE Sensitive     * STAPHYLOCOCCUS AUREUS  Blood Culture ID Panel (Reflexed)     Status: Abnormal   Collection Time: 06/05/22 10:26 PM  Result Value Ref Range Status   Enterococcus faecalis NOT DETECTED NOT DETECTED Final   Enterococcus Faecium NOT DETECTED NOT DETECTED Final   Listeria monocytogenes NOT DETECTED NOT DETECTED Final   Staphylococcus species DETECTED (A) NOT DETECTED Final    Comment: CRITICAL RESULT CALLED TO, READ BACK BY AND VERIFIED WITH: PHARMD A PAYTES [id number] AT 1325 BY CM    Staphylococcus aureus (BCID) DETECTED (A) NOT DETECTED Final    Comment: CRITICAL RESULT CALLED TO, READ BACK BY AND VERIFIED WITH: PHARMD A PAYTES [id number] AT 1325 BY CM    Staphylococcus epidermidis NOT DETECTED NOT DETECTED Final   Staphylococcus lugdunensis NOT DETECTED NOT DETECTED Final   Streptococcus species NOT DETECTED NOT DETECTED  Final   Streptococcus agalactiae NOT DETECTED NOT DETECTED Final   Streptococcus pneumoniae NOT DETECTED NOT DETECTED Final   Streptococcus pyogenes NOT DETECTED NOT DETECTED Final   A.calcoaceticus-baumannii NOT DETECTED NOT DETECTED Final   Bacteroides fragilis NOT DETECTED NOT DETECTED Final   Enterobacterales NOT DETECTED NOT DETECTED Final   Enterobacter cloacae complex NOT DETECTED NOT DETECTED Final   Escherichia coli NOT DETECTED NOT DETECTED Final   Klebsiella aerogenes NOT DETECTED NOT DETECTED Final   Klebsiella oxytoca NOT DETECTED NOT DETECTED Final   Klebsiella pneumoniae NOT DETECTED NOT DETECTED Final   Proteus species NOT DETECTED NOT DETECTED Final   Salmonella species  NOT DETECTED NOT DETECTED Final   Serratia marcescens NOT DETECTED NOT DETECTED Final   Haemophilus influenzae NOT DETECTED NOT DETECTED Final   Neisseria meningitidis NOT DETECTED NOT DETECTED Final   Pseudomonas aeruginosa NOT DETECTED NOT DETECTED Final   Stenotrophomonas maltophilia NOT DETECTED NOT DETECTED Final   Candida albicans NOT DETECTED NOT DETECTED Final   Candida auris NOT DETECTED NOT DETECTED Final   Candida glabrata NOT DETECTED NOT DETECTED Final   Candida krusei NOT DETECTED NOT DETECTED Final   Candida parapsilosis NOT DETECTED NOT DETECTED Final   Candida tropicalis NOT DETECTED NOT DETECTED Final   Cryptococcus neoformans/gattii NOT DETECTED NOT DETECTED Final   Meth resistant mecA/C and MREJ NOT DETECTED NOT DETECTED Final    Comment: Performed at [facility], [address], [place], [place] [address]  Culture, blood (Routine X 2) w Reflex to ID Panel     Status: None   Collection Time: 06/07/22  6:58 AM   Specimen: BLOOD RIGHT HAND  Result Value Ref Range Status   Specimen Description BLOOD RIGHT HAND  Final   Special Requests   Final    BOTTLES DRAWN AEROBIC AND ANAEROBIC Blood Culture adequate volume   Culture   Final    NO GROWTH 5 DAYS Performed at [facility], [address] [place], [place] [address]    Report Status 06/12/2022 FINAL  Final  Culture, blood (Routine X 2) w Reflex to ID Panel     Status: None   Collection Time: 06/07/22  7:11 AM   Specimen: BLOOD LEFT HAND  Result Value Ref Range Status   Specimen Description BLOOD LEFT HAND  Final   Special Requests   Final    BOTTLES DRAWN AEROBIC AND ANAEROBIC Blood Culture adequate volume   Culture   Final    NO GROWTH 5 DAYS Performed at [facility], [address] [place], [place] [address]    Report Status 06/12/2022 FINAL  Final    Labs: CBC: Recent Labs  Lab 06/07/22 0718 06/08/22 0042 06/09/22 0101 06/13/22 0410  WBC 9.0 6.6 7.0 7.9  NEUTROABS  --   --   --  5.7  HGB 11.9* 11.2* 10.9* 10.9*  HCT 36.1* 33.3* 33.2* 33.9*  MCV 81.5 81.8 81.2 82.3  PLT 161 158 166 328   Basic Metabolic Panel: Recent Labs  Lab 06/08/22 0042 06/09/22 0101 06/10/22 0058 06/11/22 0031 06/13/22 0410  NA 131* 132* 131* 134* 133*  K 3.3* 3.8 3.6 3.3* 3.9  CL 96* 98 95* 96* 94*  CO2 26 25 25 29 29   GLUCOSE 263* 193* 306* 179* 124*  BUN 28* 22 22 14 10   CREATININE 1.05 0.75 0.80 0.72 0.76  CALCIUM 8.3* 8.1* 8.1* 8.1* 8.4*   Liver Function Tests: No results for input(s): "AST", "ALT", "ALKPHOS", "BILITOT", "PROT", "ALBUMIN" in the last 168 hours. CBG: Recent Labs  Lab 06/12/22 1547 06/12/22 2015 06/12/22 2238 06/13/22 0547 06/13/22 1140  GLUCAP 204* 110* 91 109* 215*    Discharge time spent: greater than 30 minutes.  Signed: Meredeth Ide, MD [facility] 06/13/2022

## 2022-06-13 NOTE — TOC Progression Note (Signed)
Transition of Care [facility]) - Progression Note    Patient Details  Name: George [name] MRN: [id number] Date of Birth: 01-24-1945  Transition of Care [facility]) CM/SW Contact  Leander Rams, LCSW Phone Number: 06/13/2022, 10:56 AM  Clinical Narrative:    PT informed CSW that pt did well in session today and will notify recommendation to [facility] PT. CSW informed CM.   TOC will continue to follow.    Expected Discharge Plan: Skilled Nursing Facility    Expected Discharge Plan and Services       Living arrangements for the past 2 months: Single Family Home                                       Social Determinants of Health (SDOH) Interventions SDOH Screenings   Food Insecurity: No Food Insecurity (06/06/2022)  Housing: Patient Declined (06/06/2022)  Transportation Needs: No Transportation Needs (06/06/2022)  Utilities: Not At Risk (06/06/2022)  Tobacco Use: Medium Risk (06/12/2022)    Readmission Risk Interventions     No data to display        Oletta Lamas, MSW, LCSWA, LCASA Transitions of Care  Clinical Social Worker I

## 2022-06-13 NOTE — Progress Notes (Signed)
Physical Therapy Treatment Patient Details Name: George [name] MRN: [id number] DOB: Jul 13, 1945 Today's Date: 06/13/2022   History of Present Illness Patient is 77 y.o. male who presented with chest pain. PMH significant of AAA, chronic pain, CAD s/p CABG, HTN, HLD, DM, chronic systolic CHF, and prostate CA. Recent stress test on 05/08/22 showed prior infarction and was intermediate risk with EF 30-44%.  He was admitted under cardiology with NSTEMI with plan for cardiac catheterization.  he spiked a fever to 102.  His only complaint is L shoulder pain that radiates into his L neck and jaw and into his L chest.  Hospitalist were consulted for that.  Eventually Ortho and ID were consulted. There is straightening of the normal cervical lordosis, anterior cervical osteophytes C4;ST consulted d/t c/o swallowing difficulty by pt/family.    PT Comments    Pt tolerated treatment well today. Pt demonstrated much improved cognition today and was more willingly to participate today. Pt was able to ambulate in hallway today with IV pole at supervision level. DC recs updated to HHPT. PT will continue to follow.   Recommendations for follow up therapy are one component of a multi-disciplinary discharge planning process, led by the attending physician.  Recommendations may be updated based on patient status, additional functional criteria and insurance authorization.  Follow Up Recommendations  Can patient physically be transported by private vehicle: Yes    Assistance Recommended at Discharge Intermittent Supervision/Assistance  Patient can return home with the following A little help with walking and/or transfers;A little help with bathing/dressing/bathroom;Assistance with cooking/housework;Direct supervision/assist for medications management;Assist for transportation;Help with stairs or ramp for entrance   Equipment Recommendations  None recommended by PT    Recommendations for Other Services        Precautions / Restrictions Precautions Precautions: Fall Restrictions Weight Bearing Restrictions: No     Mobility  Bed Mobility Overal bed mobility: Needs Assistance Bed Mobility: Supine to Sit     Supine to sit: Min guard, HOB elevated     General bed mobility comments: Pt requested HHA for trunk elevation    Transfers Overall transfer level: Needs assistance Equipment used: 1 person hand held assist Transfers: Sit to/from Stand Sit to Stand: Min guard           General transfer comment: Pt again requested 1 person HHA to stand. No LOB on initial sit to stand    Ambulation/Gait Ambulation/Gait assistance: Supervision Gait Distance (Feet): 300 Feet Assistive device: IV Pole Gait Pattern/deviations: WFL(Within Functional Limits) Gait velocity: decreased     General Gait Details: Pt initial furniture walking stating that it has been awhile since he walked but was agreeable to hold onto IV pole during ambulation.   Stairs             Wheelchair Mobility    Modified Rankin (Stroke Patients Only)       Balance Overall balance assessment: Needs assistance Sitting-balance support: Single extremity supported, No upper extremity supported Sitting balance-Leahy Scale: Good     Standing balance support: Single extremity supported, During functional activity Standing balance-Leahy Scale: Fair Standing balance comment: Reliant on IV pole                            Cognition Arousal/Alertness: Awake/alert Behavior During Therapy: WFL for tasks assessed/performed Overall Cognitive Status: Within Functional Limits for tasks assessed  General Comments: Pt demonstrated much improved cognition today. Pt was A&Ox4 and was able to follow commands consistently.        Exercises      General Comments General comments (skin integrity, edema, etc.): VSS on RA      Pertinent Vitals/Pain Pain  Assessment Pain Assessment: Faces Faces Pain Scale: Hurts a little bit Pain Location: L shoulder, neck Pain Descriptors / Indicators: Sore, Guarding, Grimacing Pain Intervention(s): Monitored during session    Home Living                          Prior Function            PT Goals (current goals can now be found in the care plan section) Progress towards PT goals: Progressing toward goals    Frequency    Min 3X/week      PT Plan Discharge plan needs to be updated    Co-evaluation              AM-PAC PT "6 Clicks" Mobility   Outcome Measure  Help needed turning from your back to your side while in a flat bed without using bedrails?: None Help needed moving from lying on your back to sitting on the side of a flat bed without using bedrails?: None Help needed moving to and from a bed to a chair (including a wheelchair)?: A Little Help needed standing up from a chair using your arms (e.g., wheelchair or bedside chair)?: A Little Help needed to walk in hospital room?: A Little Help needed climbing 3-5 steps with a railing? : A Lot 6 Click Score: 19    End of Session Equipment Utilized During Treatment: Gait belt Activity Tolerance: Patient tolerated treatment well Patient left: in chair;with call bell/phone within reach;with chair alarm set Nurse Communication: Mobility status PT Visit Diagnosis: Muscle weakness (generalized) (M62.81);Other abnormalities of gait and mobility (R26.89);Difficulty in walking, not elsewhere classified (R26.2);Pain Pain - Right/Left: Left Pain - part of body: Shoulder     Time: 1610-9604 PT Time Calculation (min) (ACUTE ONLY): 25 min  Charges:  $Gait Training: 23-37 mins                     George [name], PT, DPT Acute Rehab Services [phone]    George [name] 06/13/2022, 2:51 PM

## 2022-06-13 NOTE — Progress Notes (Signed)
Pt, pt son, and pt daughter all educated on discharge packet, medications, and follow up apts. Deny any questions at this time. Prescription given to son. Wheelchair off unit

## 2022-06-13 NOTE — TOC Transition Note (Addendum)
Transition of Care [facility]) - CM/SW Discharge Note   Patient Details  Name: George [name] MRN: [id number] Date of Birth: 1945/04/09  Transition of Care [facility]) CM/SW Contact:  Leone Haven, RN Phone Number: 06/13/2022, 3:02 PM   Clinical Narrative:    Patient is set up with [facility] for HHPT, HHOT. Soc will begin over the weekend or Monday.  [facility] will provide the [facility] for IV abx.  Pam will be doing teaching with wife and son today.  NCM made referral to [facility] with [employer] for [facility], this will be brought up to room prior to dc.    Final next level of care: Home w Home Health Services Barriers to Discharge: No Barriers Identified   Patient Goals and CMS Choice CMS Medicare.gov Compare Post Acute Care list provided to:: Patient Represenative (must comment) Choice offered to / list presented to : Spouse  Discharge Placement                         Discharge Plan and Services Additional resources added to the After Visit Summary for                  DME Arranged: Bedside commode DME Agency: [employer] Date DME Agency Contacted: 06/13/22 Time DME Agency Contacted: 1500 Representative spoke with at DME Agency: Vaughan Basta HH Arranged: PT, OT HH Agency: [facility] Date [facility] Agency Contacted: 06/13/22 Time HH Agency Contacted: 1500 Representative spoke with at [facility] Agency: Cory/Pam  Social Determinants of Health (SDOH) Interventions SDOH Screenings   Food Insecurity: No Food Insecurity (06/06/2022)  Housing: Patient Declined (06/06/2022)  Transportation Needs: No Transportation Needs (06/06/2022)  Utilities: Not At Risk (06/06/2022)  Tobacco Use: Medium Risk (06/12/2022)     Readmission Risk Interventions     No data to display

## 2022-06-14 DIAGNOSIS — E119 Type 2 diabetes mellitus without complications: Secondary | ICD-10-CM | POA: Diagnosis not present

## 2022-06-14 DIAGNOSIS — M0000 Staphylococcal arthritis, unspecified joint: Secondary | ICD-10-CM | POA: Diagnosis not present

## 2022-06-17 ENCOUNTER — Telehealth: Payer: Self-pay | Admitting: *Deleted

## 2022-06-17 NOTE — Progress Notes (Unsigned)
  Care Coordination  Outreach Note  06/17/2022 Name: George [name] MRN: [id number] DOB: [birth year]   Care Coordination Outreach Attempts: An unsuccessful telephone outreach was attempted today to offer the patient information about available care coordination services.  Follow Up Plan:  Additional outreach attempts will be made to offer the patient care coordination information and services.   Encounter Outcome:  No Answer  Christie Nottingham  Care Coordination Care Guide  Direct Dial: [phone]

## 2022-06-18 NOTE — Progress Notes (Unsigned)
  Care Coordination  Outreach Note  06/18/2022 Name: CAIDEN KOLDEN MRN: [id number] DOB: 1945/12/04   Care Coordination Outreach Attempts: A second unsuccessful outreach was attempted today to offer the patient with information about available care coordination services.  Follow Up Plan:  Additional outreach attempts will be made to offer the patient care coordination information and services.   Encounter Outcome:  No Answer  Christie Nottingham  Care Coordination Care Guide  Direct Dial: [phone]

## 2022-06-19 DIAGNOSIS — M0000 Staphylococcal arthritis, unspecified joint: Secondary | ICD-10-CM | POA: Diagnosis not present

## 2022-06-20 ENCOUNTER — Telehealth: Payer: Self-pay | Admitting: *Deleted

## 2022-06-20 NOTE — Progress Notes (Signed)
  Care Coordination  Outreach Note  06/20/2022 Name: George [name] MRN: [id number] DOB: 10/18/1945   Care Coordination Outreach Attempts: A third unsuccessful outreach was attempted today to offer the patient with information about available care coordination services.  Follow Up Plan:  No further outreach attempts will be made at this time. We have been unable to contact the patient to offer or enroll patient in care coordination services  Encounter Outcome:  No Answer  Christie Nottingham  Care Coordination Care Guide  Direct Dial: [phone]

## 2022-06-20 NOTE — Progress Notes (Signed)
  Care Coordination  Outreach Note  06/20/2022 Name: George [name] MRN: [id number] DOB: 03/03/1945   Care Coordination Outreach Attempts: A third unsuccessful outreach was attempted today to offer the patient with information about available care coordination services.  Follow Up Plan:  No further outreach attempts will be made at this time. We have been unable to contact the patient to offer or enroll patient in care coordination services  Encounter Outcome:  No Answer  Sig Lashane Whelpley  Care Coordination Care Guide  Direct Dial: [phone]  

## 2022-06-20 NOTE — Patient Outreach (Signed)
  Care Coordination   Multidisciplinary Visit Note   06/20/2022 Name: George [name] MRN: [id number] DOB: 1945/08/17  George [name] is a 77 y.o. year old male who sees Kirstie Peri, MD for primary care.   SDOH assessments and interventions completed:  No     Care Coordination Interventions:  Yes, provided  Interventions Today    Flowsheet Row Most Recent Value  Chronic Disease   Chronic disease during today's visit Diabetes, Hypertension (HTN), Other  [Recent hospitalization for NSTEMI]  General Interventions   General Interventions Discussed/Reviewed Communication with  [Recent hospitalization. Patient was referred for Care Coordination services for readmission prevention & for closer follow up rising risk. For home IV antibiotics,  Diabetes follow up A1C 8.6]  Communication with RN, PCP/Specialists  [securely routed hospital discharge note and cover letter to PCP re: need for Care Coordination services. Care Guide has been unable to reach patient after 4 attempts and VM messages. Asked PCP to encourage patient to reach out to care guide to schedule.]       Follow up plan:  Pending response from PCP and outreach by patient    Encounter Outcome:  Pt. Visit Completed   Demetrios Loll, BSN, RN-BC RN Care Coordinator [facility]  [facility] Direct Dial: [phone] Main #: [phone]

## 2022-06-21 DIAGNOSIS — M4802 Spinal stenosis, cervical region: Secondary | ICD-10-CM | POA: Diagnosis not present

## 2022-06-21 DIAGNOSIS — M869 Osteomyelitis, unspecified: Secondary | ICD-10-CM | POA: Diagnosis not present

## 2022-06-21 DIAGNOSIS — M009 Pyogenic arthritis, unspecified: Secondary | ICD-10-CM | POA: Diagnosis not present

## 2022-06-21 DIAGNOSIS — E1169 Type 2 diabetes mellitus with other specified complication: Secondary | ICD-10-CM | POA: Diagnosis not present

## 2022-06-21 DIAGNOSIS — M50322 Other cervical disc degeneration at C5-C6 level: Secondary | ICD-10-CM | POA: Diagnosis not present

## 2022-06-22 DIAGNOSIS — M0000 Staphylococcal arthritis, unspecified joint: Secondary | ICD-10-CM | POA: Diagnosis not present

## 2022-06-23 LAB — LAB REPORT - SCANNED: EGFR: 77

## 2022-06-24 ENCOUNTER — Ambulatory Visit: Payer: Self-pay | Admitting: *Deleted

## 2022-06-24 ENCOUNTER — Encounter: Payer: Self-pay | Admitting: *Deleted

## 2022-06-24 DIAGNOSIS — I10 Essential (primary) hypertension: Secondary | ICD-10-CM | POA: Diagnosis not present

## 2022-06-24 DIAGNOSIS — M4640 Discitis, unspecified, site unspecified: Secondary | ICD-10-CM | POA: Diagnosis not present

## 2022-06-24 DIAGNOSIS — Z299 Encounter for prophylactic measures, unspecified: Secondary | ICD-10-CM | POA: Diagnosis not present

## 2022-06-24 DIAGNOSIS — I509 Heart failure, unspecified: Secondary | ICD-10-CM | POA: Diagnosis not present

## 2022-06-24 DIAGNOSIS — M25519 Pain in unspecified shoulder: Secondary | ICD-10-CM | POA: Diagnosis not present

## 2022-06-24 NOTE — Patient Instructions (Signed)
Visit Information  Thank you for taking time to visit with me today. Please don't hesitate to contact me if I can be of assistance to you.   Following are the goals we discussed today:   Goals Addressed               This Visit's Progress     Assess Need for Social Work Involvement. (pt-stated)   On track     Care Coordination Interventions:  Interventions Today    Flowsheet Row Most Recent Value  Chronic Disease   Chronic disease during today's visit Diabetes, Hypertension (HTN), Other  [Coronary Artery Disease & Non-ST Elevated Myocardial Infarction]  General Interventions   General Interventions Discussed/Reviewed General Interventions Discussed, Labs, Vaccines, Doctor Visits, Referral to Nurse, Health Screening, Annual Foot Exam, General Interventions Reviewed, Annual Eye Exam, Lipid Profile, Durable Medical Equipment (DME), Community Resources, Communication with, Level of Care  [Encouraged]  Labs Hgb A1c every 3 months, Kidney Function  [Encouraged]  Vaccines COVID-19, Flu, Pneumonia, RSV, Shingles, Tetanus/Pertussis/Diphtheria  [Encouraged]  Doctor Visits Discussed/Reviewed Doctor Visits Discussed, Specialist, Doctor Visits Reviewed, Annual Wellness Visits, PCP  [Encouraged]  Health Screening Bone Density, Colonoscopy, Prostate  [Encouraged]  Durable Medical Equipment (DME) Bed side commode, Glucomoter, Other  [Eye Glasses]  PCP/Specialist Visits Compliance with follow-up visit  [Encouraged]  Communication with PCP/Specialists, RN  [Encouraged]  Level of Care Adult Daycare, Personal Care Services, Applications, Assisted Living  [Encouraged]  Applications Medicaid, Personal Care Services, FL-2  [Encouraged]  Exercise Interventions   Exercise Discussed/Reviewed Exercise Discussed, Assistive device use and maintanence, Exercise Reviewed, Physical Activity, Weight Managment  [Encouraged]  Physical Activity Discussed/Reviewed Physical Activity Discussed, Home Exercise Program  (HEP), Physical Activity Reviewed, Types of exercise  [Encouraged]  Weight Management Weight loss  [Encouraged]  Education Interventions   Education Provided Provided Therapist, sports, Provided Web-based Education, Provided Education  [Encouraged]  Provided Verbal Education On Nutrition, Mental Health/Coping with Illness, When to see the doctor, Foot Care, Eye Care, Labs, Blood Sugar Monitoring, Applications, Exercise, Medication, Development worker, community, [employer] Resources  [Encouraged]  Applications Medicaid, Personal Care Services, [place]-2  [Encouraged]  Mental Health Interventions   Mental Health Discussed/Reviewed Mental Health Discussed, Anxiety, Depression, Grief and Loss, Mental Health Reviewed, Substance Abuse, Coping Strategies, Crisis, Other, Suicide  [Domestic Violence]  Nutrition Interventions   Nutrition Discussed/Reviewed Nutrition Discussed, Adding fruits and vegetables, Increasing proteins, Nutrition Reviewed, Decreasing fats, Decreasing salt, Fluid intake, Carbohydrate meal planning, Portion sizes, Decreasing sugar intake  [Encouraged]  Pharmacy Interventions   Pharmacy Dicussed/Reviewed Pharmacy Topics Discussed, Pharmacy Topics Reviewed, Medication Adherence, Affording Medications  [Encouraged]  Safety Interventions   Safety Discussed/Reviewed Safety Discussed, Safety Reviewed, Fall Risk, Home Safety  [Encouraged]  Home Safety Assistive Devices, Contact provider for referral to PT/OT, Need for home safety assessment, Refer for home visit, Refer for community resources, Contact home health agency  [Encouraged]  Advanced Directive Interventions   Advanced Directives Discussed/Reviewed Advanced Directives Discussed  [Encouraged]     Assessed Social Determinant of Health Barriers. Discussed Plans for Ongoing Care Management Follow Up. Provided Careers information officer Information for Care Management Team Members. Screened for Signs & Symptoms of Depression, Related to Chronic Disease State.  PHQ2  & PHQ9 Depression Screen Completed & Results Reviewed.  Suicidal Ideation & Homicidal Ideation Assessed - None Present.   Domestic Violence Assessed - None Present. Access to Weapons Assessed - None Present.   Active Listening & Reflection Utilized.  Verbalization of Feelings Encouraged.  Emotional Support Provided. Feelings of Caregiver  Burnout Validated. Caregiver Stress Acknowledged. Caregiver Resources Reviewed. Caregiver Support Groups Mailed. Self-Enrollment in Caregiver Support Group of Interest Emphasized. Crisis Support Information, Agencies, Services & Resources Discussed. Problem Solving Interventions Identified. Task-Centered Solutions Implemented.   Solution-Focused Strategies Developed. Acceptance & Commitment Therapy Introduced. Brief Cognitive Behavioral Therapy Initiated. Client-Centered Therapy Enacted. Reviewed Prescription Medications & Discussed Importance of Compliance. Quality of Sleep Assessed & Sleep Hygiene Techniques Promoted. CSW Collaboration with Patient & Wife, Abdelkarim Heffington to [facility] Physical & Occupational Therapy Services in Place, 2 Times Per Week, through [facility] [phone]). CSW Collaboration with Patient & Wife, Shermar Wisman to [facility] Skilled Nursing Services in Place, 1 Time Per Week, to Perform Wound Care/Dressing Changes & Replace PICC Line, through [facility] (# [phone]). CSW Collaboration with Patient & Wife, Enson Turner to Confirm Her Ability to Safely & Accurately Administer IV Antibiotics, 3 Times Per Day. CSW Collaboration with Patient & Wife, Levern Aceto to Confirm Delivery of 3-in-1/Bedside Commode, through [employer] [phone]). CSW Collaboration with Patient & Wife, Takoma Schuelke to Discuss Higher Level of Care Options (I.e. Memory Care Assisted Living Facility, Extended Care Facility, Skilled Nursing Facility) & Confirmed Disinterest. CSW Collaboration with Patient & Wife, Kamuela Shamon to Verify No In-Home Care Services, [employer], Warden/ranger, Catering manager., Covered Under Firefighter through [employer].  CSW Collaboration with Patient & Wife, Latanya Laventure to Verify No Long-Term Care Insurance Benefits, Secondary Insurance Policies, Plans, Coverage, Etc.  CSW Collaboration with Patient & Wife, Jahquel Plese to [employer] Neither Were Veterans, Making Them Ineligible to Apply for Aid & Attendance Benefits, Through [employer]. CSW Collaboration with Patient & Wife, Cyruss Stauffer to [employer] in Applying for Medicaid, through [facility] [phone]). CSW Collaboration with Patient & Wife, Jevion Kinnett to Request Review of The Following List of [employer], [employer], Emailed on 06/24/2022: ~ Adult Day Care Programs  ~ In-Home Care & Respite Agencies ~ Home Health Care Agencies ~ Respite Care Agencies & Facilities ~ Personal Care Services Application  ~ Theatre manager Providers CSW Collaboration with Patient & Wife, Justan Stutzman to Control and instrumentation engineer with [employer], Wellsite geologist of Interest, in An Effort to Obtain Care & Supervision in The Home [employer]. CSW Collaboration with Patient & Wife, Curt Dupler to [employer] in Applying for [employer], through [employer] [phone]). CSW Collaboration with Patient & Wife, Anddy Waldeck to [employer] with CSW [phone]# [phone]), if They Have Questions, Need Assistance, or If Additional Social Work Needs Are Identified Between Now & Our Next Scheduled Follow-Up Outreach Call.      Our next appointment is by telephone on 07/08/2022 at 2:45 pm.  Please call the care guide team at [phone] if you need to cancel or reschedule your appointment.   If you are experiencing a Mental Health or Behavioral Health  Crisis or need someone to talk to, please call the Suicide and Crisis Lifeline: 988 call the [place] National Suicide Prevention Lifeline: 986-150-7752 or TTY: 848-425-2461 TTY 3524581330) to talk to a trained counselor call 1-800-273-TALK (toll free, 24 hour hotline) go to [facility] [address] [place] [id number]) call the [facility] Crisis Line: [phone] call 911  Patient verbalizes understanding of instructions and care plan provided today and agrees to view in MyChart. Active MyChart status and patient understanding of how to  access instructions and care plan via MyChart confirmed with patient.     Telephone follow up appointment with care management team member scheduled for:  07/08/2022 at 2:45 pm.  [name], BSW, MSW, LCSW  Licensed Clinical Social Worker  [facility]  Mailing [place]. [address] [place], [place] [address] Physical Address[address], [place], [place] [address] Toll Free Main # [phone] Fax # [phone] Cell # [phone] [name]

## 2022-06-24 NOTE — Patient Outreach (Signed)
Care Coordination   Initial Visit Note   06/24/2022  Name: George [name] MRN: [id number] DOB: 1945-11-21  George [name] is a 77 y.o. year old male who sees George Peri, MD for primary care. I spoke with George [name] and wife, George [name] by phone today.  What matters to the patients health and wellness today?  Assess Need for Social Work Involvement.   Goals Addressed               This Visit's Progress     Assess Need for Social Work Involvement. (pt-stated)   On track     Care Coordination Interventions:  Interventions Today    Flowsheet Row Most Recent Value  Chronic Disease   Chronic disease during today's visit Diabetes, Hypertension (HTN), Other  [Coronary Artery Disease & Non-ST Elevated Myocardial Infarction]  General Interventions   General Interventions Discussed/Reviewed General Interventions Discussed, Labs, Vaccines, Doctor Visits, Referral to Nurse, Health Screening, Annual Foot Exam, General Interventions Reviewed, Annual Eye Exam, Lipid Profile, Durable Medical Equipment (DME), Community Resources, Communication with, Level of Care  [Encouraged]  Labs Hgb A1c every 3 months, Kidney Function  [Encouraged]  Vaccines COVID-19, Flu, Pneumonia, RSV, Shingles, Tetanus/Pertussis/Diphtheria  [Encouraged]  Doctor Visits Discussed/Reviewed Doctor Visits Discussed, Specialist, Doctor Visits Reviewed, Annual Wellness Visits, PCP  [Encouraged]  Health Screening Bone Density, Colonoscopy, Prostate  [Encouraged]  Durable Medical Equipment (DME) Bed side commode, Glucomoter, Other  [Eye Glasses]  PCP/Specialist Visits Compliance with follow-up visit  [Encouraged]  Communication with PCP/Specialists, RN  [Encouraged]  Level of Care Adult Daycare, Personal Care Services, Applications, Assisted Living  [Encouraged]  Applications Medicaid, Personal Care Services, FL-2  [Encouraged]  Exercise Interventions   Exercise Discussed/Reviewed Exercise Discussed, Assistive device use  and maintanence, Exercise Reviewed, Physical Activity, Weight Managment  [Encouraged]  Physical Activity Discussed/Reviewed Physical Activity Discussed, Home Exercise Program (HEP), Physical Activity Reviewed, Types of exercise  [Encouraged]  Weight Management Weight loss  [Encouraged]  Education Interventions   Education Provided Provided Therapist, sports, Provided Web-based Education, Provided Education  [Encouraged]  Provided Verbal Education On Nutrition, Mental Health/Coping with Illness, When to see the doctor, Foot Care, Eye Care, Labs, Blood Sugar Monitoring, Applications, Exercise, Medication, Development worker, community, [employer] Resources  [Encouraged]  Applications Medicaid, Personal Care Services, FL-2  [Encouraged]  Mental Health Interventions   Mental Health Discussed/Reviewed Mental Health Discussed, Anxiety, Depression, Grief and Loss, Mental Health Reviewed, Substance Abuse, Coping Strategies, Crisis, Other, Suicide  [Domestic Violence]  Nutrition Interventions   Nutrition Discussed/Reviewed Nutrition Discussed, Adding fruits and vegetables, Increasing proteins, Nutrition Reviewed, Decreasing fats, Decreasing salt, Fluid intake, Carbohydrate meal planning, Portion sizes, Decreasing sugar intake  [Encouraged]  Pharmacy Interventions   Pharmacy Dicussed/Reviewed Pharmacy Topics Discussed, Pharmacy Topics Reviewed, Medication Adherence, Affording Medications  [Encouraged]  Safety Interventions   Safety Discussed/Reviewed Safety Discussed, Safety Reviewed, Fall Risk, Home Safety  [Encouraged]  Home Safety Assistive Devices, Contact provider for referral to PT/OT, Need for home safety assessment, Refer for home visit, Refer for community resources, Contact home health agency  [Encouraged]  Advanced Directive Interventions   Advanced Directives Discussed/Reviewed Advanced Directives Discussed  [Encouraged]     Assessed Social Determinant of Health Barriers. Discussed Plans for Ongoing Care  Management Follow Up. Provided Careers information officer Information for Care Management Team Members. Screened for Signs & Symptoms of Depression, Related to Chronic Disease State.  PHQ2 & PHQ9 Depression Screen Completed & Results Reviewed.  Suicidal Ideation & Homicidal Ideation Assessed - None Present.  Domestic Violence Assessed - None Present. Access to Weapons Assessed - None Present.   Active Listening & Reflection Utilized.  Verbalization of Feelings Encouraged.  Emotional Support Provided. Feelings of Caregiver Burnout Validated. Caregiver Stress Acknowledged. Caregiver Resources Reviewed. Caregiver Support Groups Mailed. Self-Enrollment in Caregiver Support Group of Interest Emphasized. Crisis Support Information, Agencies, Services & Resources Discussed. Problem Solving Interventions Identified. Task-Centered Solutions Implemented.   Solution-Focused Strategies Developed. Acceptance & Commitment Therapy Introduced. Brief Cognitive Behavioral Therapy Initiated. Client-Centered Therapy Enacted. Reviewed Prescription Medications & Discussed Importance of Compliance. Quality of Sleep Assessed & Sleep Hygiene Techniques Promoted. CSW Collaboration with Patient & Wife, George [name] to [facility] Physical & Occupational Therapy Services in Place, 2 Times Per Week, through [facility] [phone]). CSW Collaboration with Patient & Wife, George [name] to [facility] Skilled Nursing Services in Place, 1 Time Per Week, to Perform Wound Care/Dressing Changes & Replace PICC Line, through [facility] (# [phone]). CSW Collaboration with Patient & Wife, George [name] to Confirm Her Ability to Safely & Accurately Administer IV Antibiotics, 3 Times Per Day. CSW Collaboration with Patient & Wife, George [name] to Confirm Delivery of 3-in-1/Bedside Commode, through [employer] [phone]). CSW Collaboration with Patient & Wife, George [name] to Discuss Higher Level of  Care Options (I.e. Memory Care Assisted Living Facility, Extended Care Facility, Skilled Nursing Facility) & Confirmed Disinterest. CSW Collaboration with Patient & Wife, George [name] to Verify No In-Home Care Services, [employer], Warden/ranger, Catering manager., Covered Under Firefighter through [employer].  CSW Collaboration with Patient & Wife, George [name] to Verify No Long-Term Care Insurance Benefits, Secondary Insurance Policies, Plans, Coverage, Etc.  CSW Collaboration with Patient & Wife, George [name] to [employer] Neither Were Veterans, Making Them Ineligible to Apply for Aid & Attendance Benefits, Through [employer]. CSW Collaboration with Patient & Wife, George [name] to [employer] in Applying for Medicaid, through [facility] [phone]). CSW Collaboration with Patient & Wife, George [name] to Request Review of The Following List of [employer], [employer], Emailed on 06/24/2022: ~ Adult Day Care Programs  ~ In-Home Care & Respite Agencies ~ Home Health Care Agencies ~ Respite Care Agencies & Facilities ~ Personal Care Services Application  ~ Theatre manager Providers CSW Collaboration with Patient & Wife, George [name] to Control and instrumentation engineer with [employer], Wellsite geologist of Interest, in An Effort to Obtain Care & Supervision in The Home [employer]. CSW Collaboration with Patient & Wife, George [name] to [employer] in Applying for [employer], through [employer] [phone]). CSW Collaboration with Patient & Wife, George [name] to [employer] with CSW [phone]# [phone]), if They Have Questions, Need Assistance, or If Additional Social Work Needs Are Identified Between Now & Our Next Scheduled Follow-Up Outreach Call.        SDOH assessments and interventions completed:   Yes.  SDOH Interventions Today    Flowsheet Row Most Recent Value  SDOH Interventions   Food Insecurity Interventions Intervention Not Indicated  Housing Interventions Intervention Not Indicated  Transportation Interventions Intervention Not Indicated, Patient Resources (Friends/Family)  Utilities Interventions Intervention Not Indicated  Alcohol Usage Interventions Intervention Not Indicated (Score <7)  Financial Strain Interventions Intervention Not Indicated  Physical Activity Interventions Intervention Not Indicated  Stress Interventions Intervention Not Indicated  Social Connections Interventions Intervention Not Indicated     Care Coordination Interventions:  Yes,  provided.   Follow up plan: Follow up call scheduled for 07/08/2022 at 2:45 pm.  Encounter Outcome:  Pt. Visit Completed.   [name], BSW, MSW, LCSW  Licensed Restaurant manager, [facility]  Mailing [address] [address], [place], [place] [address] Physical Address[address], [place], [place] [address] Toll Free Main # [phone] Fax # [phone] Cell # [phone] [name]

## 2022-06-26 ENCOUNTER — Encounter: Payer: Self-pay | Admitting: Internal Medicine

## 2022-06-26 ENCOUNTER — Encounter (HOSPITAL_COMMUNITY): Payer: Self-pay

## 2022-06-26 ENCOUNTER — Other Ambulatory Visit: Payer: Self-pay

## 2022-06-26 ENCOUNTER — Ambulatory Visit (INDEPENDENT_AMBULATORY_CARE_PROVIDER_SITE_OTHER): Payer: Medicare Other | Admitting: Internal Medicine

## 2022-06-26 ENCOUNTER — Telehealth: Payer: Self-pay

## 2022-06-26 VITALS — BP 139/78 | HR 89 | Temp 96.9°F

## 2022-06-26 DIAGNOSIS — B9561 Methicillin susceptible Staphylococcus aureus infection as the cause of diseases classified elsewhere: Secondary | ICD-10-CM

## 2022-06-26 DIAGNOSIS — R7881 Bacteremia: Secondary | ICD-10-CM

## 2022-06-26 NOTE — Telephone Encounter (Signed)
Patient being admitted by Dr. Thedore Mins.  She spoke with the admitting provider Dr. Jomarie Longs. I spoke with Crystal with bed control to confirm that they have patient information for admission and provided them with the patient's wife's (Carletta) number on file [id number]) to call once a bed is available. Rodarius Kichline T Pricilla Loveless

## 2022-06-26 NOTE — Progress Notes (Signed)
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   MSSA bacteremia 06/06/2022   Sepsis due to methicillin susceptible Staphylococcus aureus (MSSA) without acute organ dysfunction (HCC) 06/06/2022   Acute pain of left shoulder 06/06/2022   NSTEMI (non-ST elevated myocardial infarction) (HCC) 06/05/2022   Malignant neoplasm of prostate (HCC) 05/01/2017   Uncontrolled type 2 diabetes mellitus with hyperglycemia (HCC) 02/26/2017   Mixed hyperlipidemia 02/26/2017   Essential hypertension, benign 02/26/2017   Thoracic ascending aortic aneurysm (HCC) 01/02/2017   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 M with PMHX as below presents for hospital follow-up of MSSA bacteremia and left sternoclavicular septic arthritis/osteomyelitis.  He was admitted to [facility] 5/22 - 5/30.  Patient presenting with concern of NSTEMI and found to have high-grade MSSA bacteremia of unclear source.  Blood cultures positive on 5/22, negative on 5/24.  TTE and TEE did not show vegetation.  MRI on 5/27 showed inflammatory changes around the left sternoclavicular joint with marrow edema suspicious for septic arthritis and early osteo.  CTS engaged and recommended antibiotic management for this.  His hospital course was also complicated by neck pain.  Cervical MRI showed abnormalities at C4 possibly reflecting early discitis/osteomyelitis with short-term MRI follow-up recommended 3 weeks to assess interval change.  He also has left shoulder pain suspected secondary to cervical/sternoclavicular osteo.  Orthopedic surgery tried to aspirate  shoulder but unable to draw fluid.  MRI rotator cuff pathology showed no obvious infection. Today 06/26/2022: Patient presents with wife and sister..  Did note that he has had a new tender growth since yesterday morning on his chest/sternum that is painful and rapidly growing.  He does not have any fevers or chills.  Tolerating antibiotics.  Review of Systems  All other systems reviewed and are negative.   Past Medical History:  Diagnosis Date   Ascending aortic aneurysm (HCC)    4.1 cm in 2019   Chronic back pain    Coronary artery disease    Status post CABG 2014 (LIMA to LAD, SVG to ramus, SVG to OM 2)   Dermatophytosis of the body    Eczema    Essential hypertension    GERD (gastroesophageal reflux disease)    Herpes zoster without mention of complication    History of bronchitis    History of claustrophobia    HOH (hard of hearing)    Mixed hyperlipidemia    Prostate cancer (HCC)    Psoriasis and similar disorder    Sinusitis    Type 2 diabetes mellitus (HCC)     Outpatient Medications Prior to Visit  Medication Sig Dispense Refill   amLODipine (NORVASC) 10 MG tablet Take 1 tablet (10 mg total) by mouth daily. 30 tablet 6   atorvastatin (LIPITOR) 80 MG tablet Take 1 tablet (80 mg total) by mouth daily. 30 tablet 3   carvedilol (COREG) 12.5 MG tablet Take 1 tablet (12.5 mg total) by mouth 2 (two) times daily. 30 tablet 3   ceFAZolin (ANCEF) IVPB Inject 2 g into the vein every 8 (  eight) hours. Indication:  MSSA bacteremia/ [facility] joint osteo First Dose: Yes Last Day of Therapy:  07/19/22 Labs - Once weekly:  CBC/D and BMP, Labs - Once weekly: ESR and CRP Method of administration: IV Push Method of administration may be changed at the discretion of home infusion pharmacist based upon assessment of the patient and/or caregiver's ability to self-administer the medication ordered. 111 Units 0   HYDROcodone-acetaminophen (NORCO/VICODIN) 5-325 MG tablet Take 1 tablet by mouth every 4  (four) hours as needed.     insulin glargine (LANTUS) 100 UNIT/ML Solostar Pen Inject 70 Units into the skin at bedtime.     lisinopril (ZESTRIL) 10 MG tablet Take 1 tablet (10 mg total) by mouth daily. 30 tablet 3   Multiple Vitamins-Minerals (ICAPS AREDS 2 PO) Take 1 capsule by mouth 2 (two) times daily.     nitroGLYCERIN (NITROSTAT) 0.4 MG SL tablet Place 1 tablet (0.4 mg total) under the tongue every 5 (five) minutes x 3 doses as needed for chest pain (if no relief after 3rd dose, proceed to ED for evaluation or call 911). 25 tablet 3   NOVOLOG FLEXPEN 100 UNIT/ML FlexPen INJECT 25-31 UNITS INTO THE SKIN THREE TIMES DAILY BEFORE MEALS (Patient taking differently: Inject 50 Units into the skin 3 (three) times daily with meals.) 30 mL 0   Facility-Administered Medications Prior to Visit  Medication Dose Route Frequency Provider Last Rate Last Admin   regadenoson (LEXISCAN) injection SOLN 0.4 mg  0.4 mg Intravenous Once Chilton Si, MD       technetium tetrofosmin (TC-MYOVIEW) injection 32.8 millicurie  32.8 millicurie Intravenous Once PRN Chilton Si, MD         No Known Allergies  Social History   Tobacco Use   Smoking status: Former    Packs/day: 1.00    Years: 4.00    Additional pack years: 0.00    Total pack years: 4.00    Types: Cigarettes    Start date: 03/04/1965    Quit date: 03/05/1967    Years since quitting: 55.3    Passive exposure: Past   Smokeless tobacco: Never  Vaping Use   Vaping Use: Never used  Substance Use Topics   Alcohol use: No    Alcohol/week: 0.0 standard drinks of alcohol   Drug use: No    Family History  Problem Relation Age of Onset   CAD Other    Heart attack Other    Diabetes Other    Cataracts Mother    Amblyopia Neg Hx    Blindness Neg Hx    Glaucoma Neg Hx    Macular degeneration Neg Hx    Retinal detachment Neg Hx    Strabismus Neg Hx    Retinitis pigmentosa Neg Hx     Objective:   Vitals:   06/26/22 1549  BP:  139/78  Pulse: 89  Temp: (!) 96.9 F (36.1 C)  TempSrc: Temporal   There is no height or weight on file to calculate BMI.  Physical Exam Constitutional:      General: He is not in acute distress.    Appearance: He is normal weight. He is not toxic-appearing.  HENT:     Head: Normocephalic and atraumatic.     Right Ear: External ear normal.     Left Ear: External ear normal.     Nose: No congestion or rhinorrhea.     Mouth/Throat:     Mouth: Mucous membranes are moist.     Pharynx: Oropharynx is  clear.  Eyes:     Extraocular Movements: Extraocular movements intact.     Conjunctiva/sclera: Conjunctivae normal.     Pupils: Pupils are equal, round, and reactive to light.  Cardiovascular:     Rate and Rhythm: Normal rate and regular rhythm.     Heart sounds: No murmur heard.    No friction rub. No gallop.  Pulmonary:     Effort: Pulmonary effort is normal.     Breath sounds: Normal breath sounds.  Abdominal:     General: Abdomen is flat. Bowel sounds are normal.     Palpations: Abdomen is soft.  Musculoskeletal:        General: No swelling.     Cervical back: Normal range of motion and neck supple.  Skin:    General: Skin is warm and dry.  Neurological:     General: No focal deficit present.     Mental Status: He is oriented to person, place, and time.  Psychiatric:        Mood and Affect: Mood normal.     Lab Results: Lab Results  Component Value Date   WBC 7.9 06/13/2022   HGB 10.9 (L) 06/13/2022   HCT 33.9 (L) 06/13/2022   MCV 82.3 06/13/2022   PLT 328 06/13/2022    Lab Results  Component Value Date   CREATININE 0.76 06/13/2022   BUN 10 06/13/2022   NA 133 (L) 06/13/2022   K 3.9 06/13/2022   CL 94 (L) 06/13/2022   CO2 29 06/13/2022    Lab Results  Component Value Date   ALT 35 08/11/2018   AST 29 08/11/2018   ALKPHOS 122 08/11/2018   BILITOT 0.6 08/11/2018     Assessment & Plan:  #MSSA bacteremia with left sternoclavicular osteomyelitis/septic  arthrits -Pt states he has a new nodule, painful and warm to touch that he first noticed yesterday AM. The nodule is growing since yesterday. He noted that he needs to be "heavily sedated for MRI".  He was seen by CTS engaged hospitalization recommended antibiotic management for [facility] joint osteo/SA. -Continue cefazolin -Direct admitted for imaging and further work-up to obs given he needs  sedation.  #Possible cervical discitis/osteomyelitis -during hospitalization recommended repeat imaging around 3 weeks. - Will get MRI C-spine as well.  #Medication monitoring #Left pICC -06/23/22: wbc 9.1, scr 0.97,esr 56, crp 47   Danelle Earthly, MD [facility]  I have personally spent 50 minutes involved in face-to-face and non-face-to-face activities for this patient on the day of the visit. Professional time spent includes the following activities: Preparing to see the patient (review of tests), Obtaining and/or reviewing separately obtained history (admission/discharge record), Performing a medically appropriate examination and/or evaluation , Ordering medications/tests/procedures, referring and communicating with other health care professionals, Documenting clinical information in the EMR, Independently interpreting results (not separately reported), Communicating results to the patient/family/caregiver, Counseling and educating the patient/family/caregiver and Care coordination (not separately reported).     06/26/22  3:52 PM

## 2022-06-27 ENCOUNTER — Observation Stay (HOSPITAL_COMMUNITY): Payer: Medicare Other

## 2022-06-27 ENCOUNTER — Encounter (HOSPITAL_COMMUNITY): Payer: Self-pay | Admitting: Internal Medicine

## 2022-06-27 ENCOUNTER — Observation Stay (HOSPITAL_COMMUNITY)
Admission: AD | Admit: 2022-06-27 | Discharge: 2022-06-27 | Discharge status: Against Medical Advice | Payer: Medicare Other | Source: Ambulatory Visit | Attending: Internal Medicine | Admitting: Internal Medicine

## 2022-06-27 DIAGNOSIS — E782 Mixed hyperlipidemia: Secondary | ICD-10-CM | POA: Diagnosis not present

## 2022-06-27 DIAGNOSIS — R1902 Left upper quadrant abdominal swelling, mass and lump: Secondary | ICD-10-CM | POA: Diagnosis not present

## 2022-06-27 DIAGNOSIS — Z8546 Personal history of malignant neoplasm of prostate: Secondary | ICD-10-CM | POA: Diagnosis not present

## 2022-06-27 DIAGNOSIS — R222 Localized swelling, mass and lump, trunk: Secondary | ICD-10-CM | POA: Diagnosis not present

## 2022-06-27 DIAGNOSIS — R7881 Bacteremia: Secondary | ICD-10-CM

## 2022-06-27 DIAGNOSIS — E119 Type 2 diabetes mellitus without complications: Secondary | ICD-10-CM | POA: Diagnosis not present

## 2022-06-27 DIAGNOSIS — B9561 Methicillin susceptible Staphylococcus aureus infection as the cause of diseases classified elsewhere: Secondary | ICD-10-CM | POA: Diagnosis present

## 2022-06-27 DIAGNOSIS — M009 Pyogenic arthritis, unspecified: Secondary | ICD-10-CM | POA: Diagnosis present

## 2022-06-27 DIAGNOSIS — I5020 Unspecified systolic (congestive) heart failure: Secondary | ICD-10-CM | POA: Diagnosis not present

## 2022-06-27 DIAGNOSIS — I10 Essential (primary) hypertension: Secondary | ICD-10-CM | POA: Diagnosis present

## 2022-06-27 DIAGNOSIS — Z87891 Personal history of nicotine dependence: Secondary | ICD-10-CM | POA: Insufficient documentation

## 2022-06-27 DIAGNOSIS — Z951 Presence of aortocoronary bypass graft: Secondary | ICD-10-CM | POA: Diagnosis not present

## 2022-06-27 DIAGNOSIS — Z794 Long term (current) use of insulin: Secondary | ICD-10-CM | POA: Diagnosis not present

## 2022-06-27 DIAGNOSIS — Z79899 Other long term (current) drug therapy: Secondary | ICD-10-CM | POA: Insufficient documentation

## 2022-06-27 DIAGNOSIS — I110 Hypertensive heart disease with heart failure: Secondary | ICD-10-CM | POA: Insufficient documentation

## 2022-06-27 DIAGNOSIS — I2510 Atherosclerotic heart disease of native coronary artery without angina pectoris: Secondary | ICD-10-CM | POA: Insufficient documentation

## 2022-06-27 LAB — COMPREHENSIVE METABOLIC PANEL
ALT: 11 U/L (ref 0–44)
AST: 19 U/L (ref 15–41)
Albumin: 2.6 g/dL — ABNORMAL LOW (ref 3.5–5.0)
Alkaline Phosphatase: 94 U/L (ref 38–126)
Anion gap: 9 (ref 5–15)
BUN: 13 mg/dL (ref 8–23)
CO2: 29 mmol/L (ref 22–32)
Calcium: 8.8 mg/dL — ABNORMAL LOW (ref 8.9–10.3)
Chloride: 96 mmol/L — ABNORMAL LOW (ref 98–111)
Creatinine, Ser: 0.82 mg/dL (ref 0.61–1.24)
GFR, Estimated: >60 mL/min (ref >60)
Glucose, Bld: 264 mg/dL — ABNORMAL HIGH (ref 70–99)
Potassium: 4.6 mmol/L (ref 3.5–5.1)
Sodium: 134 mmol/L — ABNORMAL LOW (ref 135–145)
Total Bilirubin: 0.4 mg/dL (ref 0.3–1.2)
Total Protein: 7.1 g/dL (ref 6.5–8.1)

## 2022-06-27 LAB — CBC
HCT: 35.6 % — ABNORMAL LOW (ref 39.0–52.0)
Hemoglobin: 11.5 g/dL — ABNORMAL LOW (ref 13.0–17.0)
MCH: 26.8 pg (ref 26.0–34.0)
MCHC: 32.3 g/dL (ref 30.0–36.0)
MCV: 83 fL (ref 80.0–100.0)
Platelets: 257 10*3/uL (ref 150–400)
RBC: 4.29 MIL/uL (ref 4.22–5.81)
RDW: 13.1 % (ref 11.5–15.5)
WBC: 5.7 10*3/uL (ref 4.0–10.5)
nRBC: 0 % (ref 0.0–0.2)

## 2022-06-27 LAB — SEDIMENTATION RATE: Sed Rate: 67 mm/hr — ABNORMAL HIGH (ref 0–16)

## 2022-06-27 LAB — C-REACTIVE PROTEIN: CRP: 6.8 mg/dL — ABNORMAL HIGH (ref <1.0)

## 2022-06-27 LAB — PROTIME-INR
INR: 1.1 (ref 0.8–1.2)
Prothrombin Time: 14.8 seconds (ref 11.4–15.2)

## 2022-06-27 MED ORDER — INSULIN GLARGINE-YFGN 100 UNIT/ML ~~LOC~~ SOLN
75.0000 [IU] | Freq: Two times a day (BID) | SUBCUTANEOUS | Status: DC
  Filled 2022-06-27: qty 0.75

## 2022-06-27 MED ORDER — HYDROCODONE-ACETAMINOPHEN 5-325 MG PO TABS: 1.0000 | ORAL_TABLET | ORAL | Status: DC | PRN

## 2022-06-27 MED ORDER — LORAZEPAM 2 MG/ML IJ SOLN
1.0000 mg | Freq: Once | INTRAMUSCULAR | Status: AC | PRN
  Administered 2022-06-27: 1 mg via INTRAVENOUS
  Filled 2022-06-27: qty 1

## 2022-06-27 MED ORDER — CARVEDILOL 12.5 MG PO TABS: 12.5000 mg | ORAL_TABLET | Freq: Two times a day (BID) | ORAL | Status: DC

## 2022-06-27 MED ORDER — INSULIN ASPART 100 UNIT/ML IJ SOLN: 0.0000 [IU] | Freq: Three times a day (TID) | INTRAMUSCULAR | Status: DC

## 2022-06-27 MED ORDER — ONDANSETRON HCL 4 MG/2ML IJ SOLN: 4.0000 mg | Freq: Four times a day (QID) | INTRAMUSCULAR | Status: DC | PRN

## 2022-06-27 MED ORDER — ALBUTEROL SULFATE (2.5 MG/3ML) 0.083% IN NEBU: 2.5000 mg | INHALATION_SOLUTION | Freq: Four times a day (QID) | RESPIRATORY_TRACT | Status: DC | PRN

## 2022-06-27 MED ORDER — ONDANSETRON HCL 4 MG PO TABS: 4.0000 mg | ORAL_TABLET | Freq: Four times a day (QID) | ORAL | Status: DC | PRN

## 2022-06-27 MED ORDER — CEFAZOLIN SODIUM-DEXTROSE 2-4 GM/100ML-% IV SOLN
2.0000 g | Freq: Three times a day (TID) | INTRAVENOUS | Status: DC
  Administered 2022-06-27: 2 g via INTRAVENOUS
  Filled 2022-06-27: qty 100

## 2022-06-27 MED ORDER — ACETAMINOPHEN 650 MG RE SUPP: 650.0000 mg | Freq: Four times a day (QID) | RECTAL | Status: DC | PRN

## 2022-06-27 MED ORDER — HALOPERIDOL LACTATE 5 MG/ML IJ SOLN
1.0000 mg | Freq: Once | INTRAMUSCULAR | Status: AC | PRN
  Administered 2022-06-27: 1 mg via INTRAVENOUS
  Filled 2022-06-27: qty 1

## 2022-06-27 MED ORDER — SODIUM CHLORIDE 0.9% FLUSH
3.0000 mL | Freq: Two times a day (BID) | INTRAVENOUS | Status: DC
  Administered 2022-06-27: 3 mL via INTRAVENOUS

## 2022-06-27 MED ORDER — LISINOPRIL 5 MG PO TABS: 10.0000 mg | ORAL_TABLET | Freq: Every day | ORAL | Status: DC

## 2022-06-27 MED ORDER — CEFAZOLIN IV (FOR PTA / DISCHARGE USE ONLY): 2.0000 g | Freq: Three times a day (TID) | INTRAVENOUS | Status: DC

## 2022-06-27 MED ORDER — AMLODIPINE BESYLATE 10 MG PO TABS: 10.0000 mg | ORAL_TABLET | Freq: Every day | ORAL | Status: DC

## 2022-06-27 MED ORDER — ATORVASTATIN CALCIUM 80 MG PO TABS: 80.0000 mg | ORAL_TABLET | Freq: Every day | ORAL | Status: DC

## 2022-06-27 MED ORDER — ACETAMINOPHEN 325 MG PO TABS
650.0000 mg | ORAL_TABLET | Freq: Four times a day (QID) | ORAL | Status: DC | PRN
  Administered 2022-06-27: 650 mg via ORAL
  Filled 2022-06-27: qty 2

## 2022-06-27 MED ORDER — INSULIN ASPART 100 UNIT/ML IJ SOLN: 0.0000 [IU] | Freq: Every day | INTRAMUSCULAR | Status: DC

## 2022-06-27 MED ORDER — ENOXAPARIN SODIUM 40 MG/0.4ML IJ SOSY: 40.0000 mg | PREFILLED_SYRINGE | INTRAMUSCULAR | Status: DC

## 2022-06-27 MED ORDER — HALOPERIDOL LACTATE 5 MG/ML IJ SOLN: 1.0000 mg | Freq: Once | INTRAMUSCULAR | Status: DC

## 2022-06-27 NOTE — TOC Initial Note (Signed)
Transition of Care [facility]) - Initial/Assessment Note    Patient Details  Name: George [name] MRN: [id number] Date of Birth: 06-18-1945  [facility] CM/SW Contact:    Gordy Clement, RN Phone Number: 06/27/2022, 2:30 PM  Clinical Narrative:    CM met with patient, Wife and Daughter at bedside  Patient was just discharged 2 weeks ago. He came from home with Wife and was receiving Home Health PT  from [facility] from [facility]. Patient owns BSC, RW and shower chair. Patient has a PCP and is insured    TOC will continue to follow patient for any additional discharge needs                   Expected Discharge Plan: Home w Home Health Services Barriers to Discharge: Continued Medical Work up   Patient Goals and CMS Choice Patient states their goals for this hospitalization and ongoing recovery are:: go home CMS Medicare.gov Compare Post Acute Care list provided to:: Other (Comment Required) (N/A Current with [facility] and [employer]) Choice offered to / list presented to : NA      Expected Discharge Plan and Services In-house Referral: NA Discharge Planning Services: NA Post Acute Care Choice: Home Health Living arrangements for the past 2 months: Single Family Home                 DME Arranged:  (Received BSC at last admission) DME Agency: [employer]         [facility] Agency: [facility] (Patient current with [facility] prior to admission)     Representative spoke with at [facility] Agency: Cindie Sillmon  Prior Living Arrangements/Services Living arrangements for the past 2 months: Single Family Home Lives with:: Spouse Patient language and need for interpreter reviewed:: Yes Do you feel safe going back to the place where you live?: Yes      Need for Family Participation in Patient Care: Yes (Comment) Care giver support system in place?: Yes (comment) Current home services: DME, Home RN Criminal Activity/Legal Involvement Pertinent to Current  Situation/Hospitalization: No - Comment as needed  Activities of Daily Living      Permission Sought/Granted Permission sought to share information with : Other (comment) [facility]) Permission granted to share information with : Yes, Verbal Permission Granted     Permission granted to share info w AGENCY: [facility]        Emotional Assessment Appearance:: Appears stated age Attitude/Demeanor/Rapport: Engaged Affect (typically observed): Pleasant Orientation: : Oriented to Self, Oriented to Place, Oriented to  Time, Oriented to Situation Alcohol / Substance Use: Not Applicable Psych Involvement: No (comment)  Admission diagnosis:  Localized swelling of chest wall [R22.2] Patient Active Problem List   Diagnosis Date Noted   Localized swelling of chest wall 06/27/2022   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   MSSA bacteremia 06/06/2022   Sepsis due to methicillin susceptible Staphylococcus aureus (MSSA) without acute organ dysfunction (HCC) 06/06/2022   Acute pain of left shoulder 06/06/2022   NSTEMI (non-ST elevated myocardial infarction) (HCC) 06/05/2022   Malignant neoplasm of prostate (HCC) 05/01/2017   Uncontrolled type 2 diabetes mellitus with hyperglycemia (HCC) 02/26/2017   Mixed hyperlipidemia 02/26/2017   Essential hypertension, benign 02/26/2017   Thoracic ascending aortic aneurysm (HCC) 01/02/2017   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   CAD (coronary artery disease) of artery bypass graft 10/26/2012   Chest pain 08/05/2012   Abnormal stress test 08/05/2012   HTN (hypertension) 08/05/2012  PCP:  Kirstie Peri, MD Pharmacy:   [facility] - [place], [place] - [address] [place] [address] Phone: [phone] Fax: [phone]  [facility] - [place], [place] - [address] [place] [address] Phone: [phone] Fax: [phone]     Social Determinants of Health (SDOH) Social History: SDOH  Screenings   Food Insecurity: No Food Insecurity (06/24/2022)  Housing: Patient Declined (06/24/2022)  Transportation Needs: No Transportation Needs (06/24/2022)  Utilities: Not At Risk (06/24/2022)  Alcohol Screen: Low Risk  (06/24/2022)  Depression (PHQ2-9): Low Risk  (06/26/2022)  Financial Resource Strain: Low Risk  (06/24/2022)  Physical Activity: Insufficiently Active (06/24/2022)  Social Connections: Socially Integrated (06/24/2022)  Stress: No Stress Concern Present (06/24/2022)  Tobacco Use: Medium Risk (06/27/2022)   SDOH Interventions:     Readmission Risk Interventions     No data to display

## 2022-06-27 NOTE — Discharge Summary (Signed)
  Physician Discharge Summary   Patient: George [name] MRN: [id number] DOB: 12-Aug-1945  Admit date:     06/27/2022  Discharge date: 06/27/22  Discharge Physician: Clydie Braun   PCP: Kirstie Peri, MD   Patient left AGAINST MEDICAL ADVICE after attempts at MRI with Ativan and Haldol had been given.  See H&P  Signed: Clydie Braun, MD [facility] 06/27/2022

## 2022-06-27 NOTE — Progress Notes (Signed)
Pt leaving AMA. CN and Provider notified. Left with family.

## 2022-06-27 NOTE — Progress Notes (Signed)
Patient attempted MRI twice, once with ativan and again with ativan and haldol. Patient refused imaging both times and repeatedly attempted to get out of bed while waiting for transport.

## 2022-06-27 NOTE — H&P (Signed)
History and Physical    Patient: George [name] ZOX:[id number] DOB: Jul 15, 1945 DOA: 06/27/2022 DOS: the patient was seen and examined on 06/27/2022 PCP: Kirstie Peri, MD  Patient coming from: Home  Chief Complaint: Swelling and pain of chest  HPI: George [name] is a 77 y.o. male with medical history significant of hypertension, hyperlipidemia, systolic congestive heart failure, CAD s/p CABG in 2014,  diabetes mellitus type 2, AAA, and  prostate cancer who presents with complaints of pain and swelling of left upper side of his sternum.  Patient had recently been hospitalized 5/22-5/30 after presenting with chest pain noted to have NSTEMI.  During his stay patient spiked a fever up to 100.2 F with complaints of left shoulder pain.  Workup revealed patient  to have MSSA bacteremia.  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  Cardiothoracic surgery had been consulted and recommended antibiotic management.  Attempted aspiration were unsuccessful.  Blood cultures had been negative after 5/22.  TEE on 5/29 had noted no signs of endocarditis.  Patient has been placed on cefazolin every 8 hours at discharge.  Over the last 3 days patient reported progressive swelling with redness over the left superior border of the sternum.  Patient reports significant pain for which she is unable to lift his left arm.  He reports having subjective fevers, but wife never measured temperature over around 74 F.  patient left AGAINST MEDICAL ADVICE after attempts at MRI.  Review of Systems: As mentioned in the history of present illness. All other systems reviewed and are negative. Past Medical History:  Diagnosis Date   Ascending aortic aneurysm (HCC)    4.1 cm in 2019   Chronic back pain    Coronary artery disease    Status post CABG 2014 (LIMA to LAD, SVG to ramus, SVG to OM 2)    Dermatophytosis of the body    Eczema    Essential hypertension    GERD (gastroesophageal reflux disease)    Herpes zoster without mention of complication    History of bronchitis    History of claustrophobia    HOH (hard of hearing)    Mixed hyperlipidemia    Prostate cancer (HCC)    Psoriasis and similar disorder    Sinusitis    Type 2 diabetes mellitus (HCC)    Past Surgical History:  Procedure Laterality Date   CATARACT EXTRACTION W/PHACO Left 04/04/2014   Procedure: CATARACT EXTRACTION PHACO AND INTRAOCULAR LENS PLACEMENT (IOC);  Surgeon: Susa Simmonds, MD;  Location: [facility];  Service: Ophthalmology;  Laterality: Left;  CDE:4.49   CATARACT EXTRACTION W/PHACO Right 01/02/2015   Procedure: CATARACT EXTRACTION PHACO AND INTRAOCULAR LENS PLACEMENT; CDE:  6.36;  Surgeon: Susa Simmonds, MD;  Location: [facility];  Service: Ophthalmology;  Laterality: Right;   CIRCUMCISION  2010   CORONARY ARTERY BYPASS GRAFT N/A 08/13/2012   Procedure: CORONARY ARTERY BYPASS GRAFTING (CABG);  Surgeon: Kerin Perna, MD;  Location: [facility] OR;  Service: Open Heart Surgery;  Laterality: N/A;   ENDOVEIN HARVEST OF GREATER SAPHENOUS VEIN Right 08/13/2012   Procedure: ENDOVEIN HARVEST OF GREATER SAPHENOUS VEIN;  Surgeon: Kerin Perna, MD;  Location: [facility] OR;  Service: Open Heart Surgery;  Laterality: Right;   INTRAOPERATIVE TRANSESOPHAGEAL ECHOCARDIOGRAM N/A 08/13/2012   Procedure: INTRAOPERATIVE TRANSESOPHAGEAL ECHOCARDIOGRAM;  Surgeon: Kerin Perna, MD;  Location: [facility] OR;  Service: Open Heart Surgery;  Laterality: N/A;   LEFT  HEART CATHETERIZATION WITH CORONARY ANGIOGRAM N/A 08/07/2012   Procedure: LEFT HEART CATHETERIZATION WITH CORONARY ANGIOGRAM;  Surgeon: Peter M Swaziland, MD;  Location: [facility] CATH LAB;  Service: Cardiovascular;  Laterality: N/A;   RADIOACTIVE SEED IMPLANT N/A 11/25/2017   Procedure: RADIOACTIVE SEED IMPLANT/BRACHYTHERAPY IMPLANT;  Surgeon: Bjorn Pippin, MD;  Location: [facility];  Service: Urology;  Laterality: N/A;   SEED IMPLANT DONE AT [facility]  01/2017   TEE WITHOUT CARDIOVERSION N/A 06/12/2022   Procedure: TRANSESOPHAGEAL ECHOCARDIOGRAM;  Surgeon: Chilton Si, MD;  Location: [facility] INVASIVE CV LAB;  Service: Cardiovascular;  Laterality: N/A;   Social History:  reports that he quit smoking about 55 years ago. His smoking use included cigarettes. He started smoking about 57 years ago. He has a 4.00 pack-year smoking history. He has been exposed to tobacco smoke. He has never used smokeless tobacco. He reports that he does not drink alcohol and does not use drugs.  No Known Allergies  Family History  Problem Relation Age of Onset   CAD Other    Heart attack Other    Diabetes Other    Cataracts Mother    Amblyopia Neg Hx    Blindness Neg Hx    Glaucoma Neg Hx    Macular degeneration Neg Hx    Retinal detachment Neg Hx    Strabismus Neg Hx    Retinitis pigmentosa Neg Hx     Prior to Admission medications   Medication Sig Start Date End Date Taking? Authorizing Provider  amLODipine (NORVASC) 10 MG tablet Take 1 tablet (10 mg total) by mouth daily. 09/19/20   Netta Neat., NP  atorvastatin (LIPITOR) 80 MG tablet Take 1 tablet (80 mg total) by mouth daily. 06/14/22   Meredeth Ide, MD  carvedilol (COREG) 12.5 MG tablet Take 1 tablet (12.5 mg total) by mouth 2 (two) times daily. 06/13/22   Meredeth Ide, MD  ceFAZolin (ANCEF) IVPB Inject 2 g into the vein every 8 (eight) hours. Indication:  MSSA bacteremia/ [place] joint osteo First Dose: Yes Last Day of Therapy:  07/19/22 Labs - Once weekly:  CBC/D and BMP, Labs - Once weekly: ESR and CRP Method of administration: IV Push Method of administration may be changed at the discretion of home infusion pharmacist based upon assessment of the patient and/or caregiver's ability to self-administer the medication ordered. 06/13/22 07/20/22  Meredeth Ide, MD  HYDROcodone-acetaminophen (NORCO/VICODIN) 5-325 MG tablet  Take 1 tablet by mouth every 4 (four) hours as needed. 06/24/22   [provider]  insulin glargine (LANTUS) 100 UNIT/ML Solostar Pen Inject 70 Units into the skin at bedtime.    [provider]  lisinopril (ZESTRIL) 10 MG tablet Take 1 tablet (10 mg total) by mouth daily. 06/13/22 06/13/23  Meredeth Ide, MD  Multiple Vitamins-Minerals (ICAPS AREDS 2 PO) Take 1 capsule by mouth 2 (two) times daily.    [provider]  nitroGLYCERIN (NITROSTAT) 0.4 MG SL tablet Place 1 tablet (0.4 mg total) under the tongue every 5 (five) minutes x 3 doses as needed for chest pain (if no relief after 3rd dose, proceed to ED for evaluation or call 911). 04/24/22   Jonelle Sidle, MD  NOVOLOG FLEXPEN 100 UNIT/ML FlexPen INJECT 25-31 UNITS INTO THE SKIN THREE TIMES DAILY BEFORE MEALS Patient taking differently: Inject 50 Units into the skin 3 (three) times daily with meals. 09/18/17   Roma Kayser, MD    Physical Exam: Vitals:  06/27/22 1106  BP: (!) 148/72  Pulse: 80  Resp: 12  TempSrc: Oral  SpO2: 95%  Weight: 102.5 kg  Height: 6\' 5"  (1.956 m)   Constitutional: Elderly male currently in no acute distress Eyes: PERRL, lids and conjunctivae normal ENMT: Mucous membranes are dry. Neck: normal, supple  Respiratory: clear to auscultation bilaterally, no wheezing, no crackles. Normal respiratory effort.   Cardiovascular: Regular rate and rhythm, no murmurs / rubs / gallops.  Redness and swelling of the left supraclavicular joint present.  PICC line in place in right arm Abdomen: no tenderness, no masses palpated.  Bowel sounds positive.  Musculoskeletal: no clubbing / cyanosis. No joint deformity upper and lower extremities.  Decreased range of motion of the left arm due to pain. Skin: no rashes, lesions, ulcers. No induration Neurologic: CN 2-12 grossly intact. Sensation intact, DTR normal. Strength 5/5 in all 4.  Psychiatric: Normal judgment and insight. Alert and oriented  x 3. Normal mood.   Data Reviewed:  Labs revealed WBC 5.7, hemoglobin 11.5, sodium 134, calcium 8.8, glucose 264.  Assessment and Plan:  Left supraclavicular joint swelling and pain Secondary to suspected septic arthritis of the left supraclavicular joint Acute.  Patient presents with progressive swelling and redness of the left sternoclavicular joint over the last 3 days.  During previous hospitalization there was concern on MRI from 5/27 for septic arthritis and early osteomyelitis of the sternoclavicular joint on the left.  Orthopedics had been consulted and tried to perform an arthrocentesis was noted to be a dry tap during last hospitalization. -Admit to a medical telemetry bed for observation -Check blood cultures -Check ESR and CRP -Check MRI of the supraclavicular joint -Appreciate ID consultative services, follow-up for any further recommendations. -Attempted MRI giving patient 1 mg of Ativan and subsequently Haldol 1 mg IV as previously had tolerated procedure with 2 mg.  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  Blood cultures initially positive from 5/22 for MSSA bacteremia.  Repeat blood cultures since 5/24 negative.  TEE on 5/29 was negative for any signs of a vegetation.  Previous MRI/24 noted possible C4 discitis. -Continue empiric antibiotics of cefazolin -Check MRI of the cervical spine.  Uncontrolled diabetes mellitus type 2 On admission glucose 264.  Hemoglobin A1c was 8.6. -Hypoglycemic protocols -Semglee 75 units twice daily -CBGs before every meal with resistant SSI -Adjust insulin regimen as needed  Essential hypertension Blood pressures 139/78 148/72. -Continue amlodipine, Coreg, and lisinopril as tolerated  CAD s/p CABG During hospitalization last month patient noted to have NSTEMI. -Continue statin  Hyperlipidemia -Continue  atorvastatin   DVT prophylaxis: Lovenox Advance Care Planning:   Code Status: Full Code   Consults: ID  Family Communication: Wife updated at bedside  Severity of Illness: The appropriate patient status for this patient is OBSERVATION. Observation status is judged to be reasonable and necessary in order to provide the required intensity of service to ensure the patient's safety. The patient's presenting symptoms, physical exam findings, and initial radiographic and laboratory data in the context of their medical condition is felt to place them at decreased risk for further clinical deterioration. Furthermore, it is anticipated that the patient will be medically stable for discharge from the hospital within 2 midnights of admission.   Author: Clydie Braun, MD 06/27/2022 11:51 AM  For on call review [email or web address].

## 2022-06-28 LAB — CULTURE, BLOOD (ROUTINE X 2)
Culture: NO GROWTH
Special Requests: ADEQUATE

## 2022-06-29 DIAGNOSIS — M0000 Staphylococcal arthritis, unspecified joint: Secondary | ICD-10-CM | POA: Diagnosis not present

## 2022-06-29 LAB — CULTURE, BLOOD (ROUTINE X 2): Culture: NO GROWTH

## 2022-06-30 LAB — CULTURE, BLOOD (ROUTINE X 2): Special Requests: ADEQUATE

## 2022-07-02 DIAGNOSIS — M4802 Spinal stenosis, cervical region: Secondary | ICD-10-CM | POA: Diagnosis not present

## 2022-07-02 DIAGNOSIS — E1169 Type 2 diabetes mellitus with other specified complication: Secondary | ICD-10-CM | POA: Diagnosis not present

## 2022-07-02 DIAGNOSIS — M009 Pyogenic arthritis, unspecified: Secondary | ICD-10-CM | POA: Diagnosis not present

## 2022-07-02 DIAGNOSIS — M869 Osteomyelitis, unspecified: Secondary | ICD-10-CM | POA: Diagnosis not present

## 2022-07-02 LAB — CULTURE, BLOOD (ROUTINE X 2)

## 2022-07-03 DIAGNOSIS — M4640 Discitis, unspecified, site unspecified: Secondary | ICD-10-CM | POA: Diagnosis not present

## 2022-07-03 DIAGNOSIS — R509 Fever, unspecified: Secondary | ICD-10-CM | POA: Diagnosis not present

## 2022-07-03 DIAGNOSIS — B9561 Methicillin susceptible Staphylococcus aureus infection as the cause of diseases classified elsewhere: Secondary | ICD-10-CM | POA: Diagnosis not present

## 2022-07-03 DIAGNOSIS — Z299 Encounter for prophylactic measures, unspecified: Secondary | ICD-10-CM | POA: Diagnosis not present

## 2022-07-03 DIAGNOSIS — M0000 Staphylococcal arthritis, unspecified joint: Secondary | ICD-10-CM | POA: Diagnosis not present

## 2022-07-04 DIAGNOSIS — E785 Hyperlipidemia, unspecified: Secondary | ICD-10-CM | POA: Diagnosis not present

## 2022-07-04 DIAGNOSIS — M4640 Discitis, unspecified, site unspecified: Secondary | ICD-10-CM | POA: Diagnosis not present

## 2022-07-04 DIAGNOSIS — Z7722 Contact with and (suspected) exposure to environmental tobacco smoke (acute) (chronic): Secondary | ICD-10-CM | POA: Diagnosis not present

## 2022-07-04 DIAGNOSIS — M2578 Osteophyte, vertebrae: Secondary | ICD-10-CM | POA: Diagnosis not present

## 2022-07-04 DIAGNOSIS — M50321 Other cervical disc degeneration at C4-C5 level: Secondary | ICD-10-CM | POA: Diagnosis not present

## 2022-07-04 DIAGNOSIS — M4802 Spinal stenosis, cervical region: Secondary | ICD-10-CM | POA: Diagnosis not present

## 2022-07-04 DIAGNOSIS — M542 Cervicalgia: Secondary | ICD-10-CM | POA: Diagnosis not present

## 2022-07-04 DIAGNOSIS — M50322 Other cervical disc degeneration at C5-C6 level: Secondary | ICD-10-CM | POA: Diagnosis not present

## 2022-07-04 DIAGNOSIS — Z9889 Other specified postprocedural states: Secondary | ICD-10-CM | POA: Diagnosis not present

## 2022-07-04 LAB — LAB REPORT - SCANNED: EGFR: 88

## 2022-07-06 DIAGNOSIS — M0000 Staphylococcal arthritis, unspecified joint: Secondary | ICD-10-CM | POA: Diagnosis not present

## 2022-07-08 ENCOUNTER — Ambulatory Visit: Payer: Self-pay | Admitting: *Deleted

## 2022-07-08 ENCOUNTER — Encounter: Payer: Self-pay | Admitting: *Deleted

## 2022-07-08 ENCOUNTER — Telehealth: Payer: Self-pay

## 2022-07-08 NOTE — Telephone Encounter (Signed)
Patient wife called requesting results of spine MRI done on 6/20 at [facility].

## 2022-07-08 NOTE — Patient Instructions (Signed)
Visit Information  Thank you for taking time to visit with me today. Please don't hesitate to contact me if I can be of assistance to you.   Following are the goals we discussed today:   Goals Addressed               This Visit's Progress     Assess Need for Social Work Involvement. (pt-stated)   On track     Care Coordination Interventions:  Interventions Today    Flowsheet Row Most Recent Value  Chronic Disease   Chronic disease during today's visit Diabetes, Hypertension (HTN), Other  [Coronary Artery Disease & Non-ST Elevated Myocardial Infarction]  General Interventions   General Interventions Discussed/Reviewed General Interventions Discussed, Labs, Vaccines, Doctor Visits, Referral to Nurse, Health Screening, Annual Foot Exam, General Interventions Reviewed, Annual Eye Exam, Lipid Profile, Durable Medical Equipment (DME), Community Resources, Communication with, Level of Care  [Encouraged]  Labs Hgb A1c every 3 months, Kidney Function  [Encouraged]  Vaccines COVID-19, Flu, Pneumonia, RSV, Shingles, Tetanus/Pertussis/Diphtheria  [Encouraged]  Doctor Visits Discussed/Reviewed Doctor Visits Discussed, Specialist, Doctor Visits Reviewed, Annual Wellness Visits, PCP  [Encouraged]  Health Screening Bone Density, Colonoscopy, Prostate  [Encouraged]  Durable Medical Equipment (DME) Bed side commode, Glucomoter, Other  [Eye Glasses]  PCP/Specialist Visits Compliance with follow-up visit  [Encouraged]  Communication with PCP/Specialists, RN  [Encouraged]  Level of Care Adult Daycare, Personal Care Services, Applications, Assisted Living  [Encouraged]  Applications Medicaid, Personal Care Services, FL-2  [Encouraged]  Exercise Interventions   Exercise Discussed/Reviewed Exercise Discussed, Assistive device use and maintanence, Exercise Reviewed, Physical Activity, Weight Managment  [Encouraged]  Physical Activity Discussed/Reviewed Physical Activity Discussed, Home Exercise Program  (HEP), Physical Activity Reviewed, Types of exercise  [Encouraged]  Weight Management Weight loss  [Encouraged]  Education Interventions   Education Provided Provided Therapist, sports, Provided Web-based Education, Provided Education  [Encouraged]  Provided Verbal Education On Nutrition, Mental Health/Coping with Illness, When to see the doctor, Foot Care, Eye Care, Labs, Blood Sugar Monitoring, Applications, Exercise, Medication, Development worker, community, [employer] Resources  [Encouraged]  Applications Medicaid, Personal Care Services, [place]-2  [Encouraged]  Mental Health Interventions   Mental Health Discussed/Reviewed Mental Health Discussed, Anxiety, Depression, Grief and Loss, Mental Health Reviewed, Substance Abuse, Coping Strategies, Crisis, Other, Suicide  [Domestic Violence]  Nutrition Interventions   Nutrition Discussed/Reviewed Nutrition Discussed, Adding fruits and vegetables, Increasing proteins, Nutrition Reviewed, Decreasing fats, Decreasing salt, Fluid intake, Carbohydrate meal planning, Portion sizes, Decreasing sugar intake  [Encouraged]  Pharmacy Interventions   Pharmacy Dicussed/Reviewed Pharmacy Topics Discussed, Pharmacy Topics Reviewed, Medication Adherence, Affording Medications  [Encouraged]  Safety Interventions   Safety Discussed/Reviewed Safety Discussed, Safety Reviewed, Fall Risk, Home Safety  [Encouraged]  Home Safety Assistive Devices, Contact provider for referral to PT/OT, Need for home safety assessment, Refer for home visit, Refer for community resources, Contact home health agency  [Encouraged]  Advanced Directive Interventions   Advanced Directives Discussed/Reviewed Advanced Directives Discussed  [Encouraged]     Active Listening & Reflection Utilized.  Verbalization of Feelings Encouraged.  Emotional Support Provided. Feelings of Sadness Regarding Declining Health Validated. Symptoms of Anxiety & Stress Acknowledged. Caregiver Resources Revisited. Caregiver  Support Groups Reviewed. Self-Enrollment in Caregiver Support Group of Interest Emphasized. Problem Solving Interventions Implemented. Task-Centered Solutions Indicated.   Solution-Focused Strategies Activated. Acceptance & Commitment Therapy Performed. Cognitive Behavioral Therapy Initiated. Client-Centered Therapy Employed. CSW Collaboration with Wife, Kelan Pritt to Confirm Continued Home Health Physical & Occupational Therapy Services, As Well As Skilled Nursing Services, through  [facility] [phone]). CSW Collaboration with Wife, Jacorie Ernsberger to [employer] Receipt & Thoroughly Review The Following List of [employer], [employer], to [employer], Entertain Questions, & Offer Assistance with Application Completion & Referral Process: ~ Adult Day Care Programs  ~ In-Home Care & Respite Agencies ~ Home Health Care Agencies ~ Respite Care Agencies & Facilities ~ Personal Care Services Application  ~ Theatre manager Providers CSW Collaboration with Wife, Ishmel Acevedo to Control and instrumentation engineer with [employer], Services & Resources of Interest, in An Effort to Obtain Care & Supervision for Patient in The Home & Community. CSW Collaboration with Wife, Terrence Wishon to [employer] in Applying for [employer], through [employer] [phone]). CSW Collaboration with Wife, Napolean Sia to [employer] with CSW [phone]# [phone]), if She Has Questions, Needs Assistance, or If Additional Social Work Needs Are Identified Between Now & Our Next Scheduled Follow-Up Outreach Call.      Our next appointment is by telephone on 07/22/2022 at 4:15 pm.  Please call the care guide team at [phone] if you need to cancel or reschedule your appointment.   If you are experiencing a Mental Health or Behavioral Health Crisis or need someone to talk to, please call the Suicide and Crisis Lifeline:  988 call the [place] National Suicide Prevention Lifeline: (862)111-0564 or TTY: (856)040-2676 TTY 902 538 4985) to talk to a trained counselor call 1-800-273-TALK (toll free, 24 hour hotline) go to [facility] [address] [place] [phone]) call the [facility] Crisis Line: (564)116-9759 call 911  Patient verbalizes understanding of instructions and care plan provided today and agrees to view in MyChart. Active MyChart status and patient understanding of how to access instructions and care plan via MyChart confirmed with patient.     Telephone follow up appointment with care management team member scheduled for:  07/22/2022 at 4:15 pm.  [name], BSW, MSW, LCSW  Licensed Clinical Social Worker  [facility]  Mailing [place]. [address] [place], [place] [address] Physical Address-[address] [place], [place] [address] Toll Free Main # [phone] Fax # [phone] Cell # [phone] [name]

## 2022-07-08 NOTE — Patient Outreach (Signed)
Care Coordination   Follow Up Visit Note   07/08/2022  Name: George [name] MRN: [id number] DOB: 03/14/1945  George [name] is a 77 y.o. year old male who sees Kirstie Peri, MD for primary care. I spoke with patient's wife, George [name] by phone today.  What matters to the patients health and wellness today?  Assess Need for Social Work Involvement.    Goals Addressed               This Visit's Progress     Assess Need for Social Work Involvement. (pt-stated)   On track     Care Coordination Interventions:  Interventions Today    Flowsheet Row Most Recent Value  Chronic Disease   Chronic disease during today's visit Diabetes, Hypertension (HTN), Other  [Coronary Artery Disease & Non-ST Elevated Myocardial Infarction]  General Interventions   General Interventions Discussed/Reviewed General Interventions Discussed, Labs, Vaccines, Doctor Visits, Referral to Nurse, Health Screening, Annual Foot Exam, General Interventions Reviewed, Annual Eye Exam, Lipid Profile, Durable Medical Equipment (DME), Community Resources, Communication with, Level of Care  [Encouraged]  Labs Hgb A1c every 3 months, Kidney Function  [Encouraged]  Vaccines COVID-19, Flu, Pneumonia, RSV, Shingles, Tetanus/Pertussis/Diphtheria  [Encouraged]  Doctor Visits Discussed/Reviewed Doctor Visits Discussed, Specialist, Doctor Visits Reviewed, Annual Wellness Visits, PCP  [Encouraged]  Health Screening Bone Density, Colonoscopy, Prostate  [Encouraged]  Durable Medical Equipment (DME) Bed side commode, Glucomoter, Other  [Eye Glasses]  PCP/Specialist Visits Compliance with follow-up visit  [Encouraged]  Communication with PCP/Specialists, RN  [Encouraged]  Level of Care Adult Daycare, Personal Care Services, Applications, Assisted Living  [Encouraged]  Applications Medicaid, Personal Care Services, [place]-2  [Encouraged]  Exercise Interventions   Exercise Discussed/Reviewed Exercise Discussed, Assistive device use and  maintanence, Exercise Reviewed, Physical Activity, Weight Managment  [Encouraged]  Physical Activity Discussed/Reviewed Physical Activity Discussed, Home Exercise Program (HEP), Physical Activity Reviewed, Types of exercise  [Encouraged]  Weight Management Weight loss  [Encouraged]  Education Interventions   Education Provided Provided Therapist, sports, Provided Web-based Education, Provided Education  [Encouraged]  Provided Verbal Education On Nutrition, Mental Health/Coping with Illness, When to see the doctor, Foot Care, Eye Care, Labs, Blood Sugar Monitoring, Applications, Exercise, Medication, Development worker, community, [employer] Resources  [Encouraged]  Applications Medicaid, Personal Care Services, [place]-2  [Encouraged]  Mental Health Interventions   Mental Health Discussed/Reviewed Mental Health Discussed, Anxiety, Depression, Grief and Loss, Mental Health Reviewed, Substance Abuse, Coping Strategies, Crisis, Other, Suicide  [Domestic Violence]  Nutrition Interventions   Nutrition Discussed/Reviewed Nutrition Discussed, Adding fruits and vegetables, Increasing proteins, Nutrition Reviewed, Decreasing fats, Decreasing salt, Fluid intake, Carbohydrate meal planning, Portion sizes, Decreasing sugar intake  [Encouraged]  Pharmacy Interventions   Pharmacy Dicussed/Reviewed Pharmacy Topics Discussed, Pharmacy Topics Reviewed, Medication Adherence, Affording Medications  [Encouraged]  Safety Interventions   Safety Discussed/Reviewed Safety Discussed, Safety Reviewed, Fall Risk, Home Safety  [Encouraged]  Home Safety Assistive Devices, Contact provider for referral to PT/OT, Need for home safety assessment, Refer for home visit, Refer for community resources, Contact home health agency  [Encouraged]  Advanced Directive Interventions   Advanced Directives Discussed/Reviewed Advanced Directives Discussed  [Encouraged]     Active Listening & Reflection Utilized.  Verbalization of Feelings Encouraged.   Emotional Support Provided. Feelings of Sadness Regarding Declining Health Validated. Symptoms of Anxiety & Stress Acknowledged. Caregiver Resources Revisited. Caregiver Support Groups Reviewed. Self-Enrollment in Caregiver Support Group of Interest Emphasized. Problem Solving Interventions Implemented. Task-Centered Solutions Indicated.   Solution-Focused Strategies Activated. Acceptance & Commitment Therapy  Performed. Cognitive Behavioral Therapy Initiated. Client-Centered Therapy Employed. CSW Collaboration with Wife, Mycheal Veldhuizen to Confirm Continued Home Health Physical & Occupational Therapy Services, As Well As Skilled Nursing Services, through [facility] [phone]). CSW Collaboration with Wife, Joshva Labreck to [employer] Receipt & Thoroughly Review The Following List of [employer], [employer], to [employer], Entertain Questions, & Offer Assistance with Application Completion & Referral Process: ~ Adult Day Care Programs  ~ In-Home Care & Respite Agencies ~ Home Health Care Agencies ~ Respite Care Agencies & Facilities ~ Personal Care Services Application  ~ Theatre manager Providers CSW Collaboration with Wife, Tiffany Talarico to Control and instrumentation engineer with [employer], Services & Resources of Interest, in An Effort to Obtain Care & Supervision for Patient in The Home & Community. CSW Collaboration with Wife, Kendyl Bissonnette to [employer] in Applying for [employer], through [employer] [phone]). CSW Collaboration with Wife, Moritz Lever to [employer] with CSW [phone]# [phone]), if She Has Questions, Needs Assistance, or If Additional Social Work Needs Are Identified Between Now & Our Next Scheduled Follow-Up Outreach Call.      SDOH assessments and interventions completed:  Yes.  Care Coordination Interventions:  Yes, provided.   Follow up plan: Follow up call  scheduled for 07/22/2022 at 4:15 pm.  Encounter Outcome:  Pt. Visit Completed.   [name], BSW, MSW, LCSW  Licensed Restaurant manager, [facility]  Mailing [place] [address] [place], [place] [address] Physical Address-[address], [place], [place] [address] Toll Free Main # [phone] Fax # [phone] Cell # [phone] [name]

## 2022-07-09 DIAGNOSIS — E1169 Type 2 diabetes mellitus with other specified complication: Secondary | ICD-10-CM | POA: Diagnosis not present

## 2022-07-09 DIAGNOSIS — M50322 Other cervical disc degeneration at C5-C6 level: Secondary | ICD-10-CM | POA: Diagnosis not present

## 2022-07-09 DIAGNOSIS — M009 Pyogenic arthritis, unspecified: Secondary | ICD-10-CM | POA: Diagnosis not present

## 2022-07-09 DIAGNOSIS — M869 Osteomyelitis, unspecified: Secondary | ICD-10-CM | POA: Diagnosis not present

## 2022-07-09 DIAGNOSIS — M4802 Spinal stenosis, cervical region: Secondary | ICD-10-CM | POA: Diagnosis not present

## 2022-07-09 NOTE — Telephone Encounter (Signed)
Patient's wife called back, asking if provider has had a chance to review patient's MRI.   Sandie Ano, RN

## 2022-07-10 DIAGNOSIS — B9561 Methicillin susceptible Staphylococcus aureus infection as the cause of diseases classified elsewhere: Secondary | ICD-10-CM | POA: Diagnosis not present

## 2022-07-10 DIAGNOSIS — M0000 Staphylococcal arthritis, unspecified joint: Secondary | ICD-10-CM | POA: Diagnosis not present

## 2022-07-12 DIAGNOSIS — E119 Type 2 diabetes mellitus without complications: Secondary | ICD-10-CM | POA: Diagnosis not present

## 2022-07-12 LAB — LAB REPORT - SCANNED: EGFR: 96

## 2022-07-13 DIAGNOSIS — M0000 Staphylococcal arthritis, unspecified joint: Secondary | ICD-10-CM | POA: Diagnosis not present

## 2022-07-14 DIAGNOSIS — E119 Type 2 diabetes mellitus without complications: Secondary | ICD-10-CM | POA: Diagnosis not present

## 2022-07-15 ENCOUNTER — Telehealth: Payer: Self-pay

## 2022-07-15 ENCOUNTER — Telehealth (INDEPENDENT_AMBULATORY_CARE_PROVIDER_SITE_OTHER): Payer: Medicare Other | Admitting: Internal Medicine

## 2022-07-15 ENCOUNTER — Other Ambulatory Visit: Payer: Self-pay

## 2022-07-15 DIAGNOSIS — B9561 Methicillin susceptible Staphylococcus aureus infection as the cause of diseases classified elsewhere: Secondary | ICD-10-CM | POA: Diagnosis not present

## 2022-07-15 DIAGNOSIS — R7881 Bacteremia: Secondary | ICD-10-CM | POA: Diagnosis not present

## 2022-07-15 NOTE — Telephone Encounter (Signed)
Spoke with patient's wife and notified her that the MRI was of cervical spine and that Dr. Thedore Mins needed an MRI of the sternum. They accept a video visit to further discuss plan with Dr. Thedore Mins this afternoon.   Sandie Ano, RN

## 2022-07-15 NOTE — Telephone Encounter (Signed)
OPAT extended to 07/31/22 per Dr. Thedore Mins, orders sent to Jeri Modena, RN with [facility]. Patient has follow up with Tammy Sours 7/17.  Sandie Ano, RN

## 2022-07-15 NOTE — Progress Notes (Signed)
Subjective:    Patient ID: George [name], male    DOB: 1945/04/03, 77 y.o.   MRN: [id number]  Virtual Visit via Telephone/Video Note   I connected with [name] on 07/18/2022 at 6:52 AM by telephone and verified that I am speaking with the correct person using two identifiers.   I discussed the limitations, risks, security and privacy concerns of performing an evaluation and management service by telephone and the availability of in person appointments. I also discussed with the patient that there may be a patient responsible charge related to this service. The patient expressed understanding and agreed to proceed.  Location:  Patient: Home Provider: [facility]   HPI:  George [name] is a 77 y.o. male M with PMHX as below presents for hospital follow-up of MSSA bacteremia and left sternoclavicular septic arthritis/osteomyelitis.  He was admitted to [facility]/22 - 5/30.  Patient presenting with concern of NSTEMI and found to have high-grade MSSA bacteremia of unclear source.  Blood cultures positive on 5/22, negative on 5/24.  TTE and TEE did not show vegetation.  MRI on 5/27 showed inflammatory changes around the left sternoclavicular joint with marrow edema suspicious for septic arthritis and early osteo.  CTS engaged and recommended antibiotic management for this.  His hospital course was also complicated by neck pain.  Cervical MRI showed abnormalities at C4 possibly reflecting early discitis/osteomyelitis with short-term MRI follow-up recommended 3 weeks to assess interval change.  He also has left shoulder pain suspected secondary to cervical/sternoclavicular osteo.  Orthopedic surgery tried to aspirate shoulder but unable to draw fluid.  MRI rotator cuff pathology showed no obvious infection.  06/26/2022: Patient presents with wife and sister..  Did note that he has had a new tender growth since yesterday morning on his chest/sternum that is painful and rapidly growing.  He does not  have any fevers or chills.  Tolerating antibiotics. Today  Video 07/15/22: missed in person visit.  Family notes that sternal mass is still there.Unchanged since last visit  No Known Allergies    Outpatient Medications Prior to Visit  Medication Sig Dispense Refill   acetaminophen (TYLENOL) 500 MG tablet Take 1,000 mg by mouth every 6 (six) hours as needed for moderate pain.     amLODipine (NORVASC) 10 MG tablet Take 1 tablet (10 mg total) by mouth daily. 30 tablet 6   atorvastatin (LIPITOR) 80 MG tablet Take 1 tablet (80 mg total) by mouth daily. 30 tablet 3   carvedilol (COREG) 12.5 MG tablet Take 1 tablet (12.5 mg total) by mouth 2 (two) times daily. 30 tablet 3   ceFAZolin (ANCEF) IVPB Inject 2 g into the vein every 8 (eight) hours. Indication:  MSSA bacteremia/ [place] joint osteo First Dose: Yes Last Day of Therapy:  07/19/22 Labs - Once weekly:  CBC/D and BMP, Labs - Once weekly: ESR and CRP Method of administration: IV Push Method of administration may be changed at the discretion of home infusion pharmacist based upon assessment of the patient and/or caregiver's ability to self-administer the medication ordered. 111 Units 0   HYDROcodone-acetaminophen (NORCO/VICODIN) 5-325 MG tablet Take 1 tablet by mouth every 4 (four) hours as needed for moderate pain.     insulin glargine (LANTUS) 100 UNIT/ML Solostar Pen Inject 70-75 Units into the skin at bedtime.     lisinopril (ZESTRIL) 10 MG tablet Take 1 tablet (10 mg total) by mouth daily. 30 tablet 3   Multiple Vitamins-Minerals (ICAPS AREDS 2 PO)  Take 1 capsule by mouth 2 (two) times daily.     nitroGLYCERIN (NITROSTAT) 0.4 MG SL tablet Place 1 tablet (0.4 mg total) under the tongue every 5 (five) minutes x 3 doses as needed for chest pain (if no relief after 3rd dose, proceed to ED for evaluation or call 911). 25 tablet 3   NOVOLOG FLEXPEN 100 UNIT/ML FlexPen INJECT 25-31 UNITS INTO THE SKIN THREE TIMES DAILY BEFORE MEALS (Patient taking  differently: Inject 50 Units into the skin in the morning, at noon, and at bedtime.) 30 mL 0   Facility-Administered Medications Prior to Visit  Medication Dose Route Frequency Provider Last Rate Last Admin   regadenoson (LEXISCAN) injection SOLN 0.4 mg  0.4 mg Intravenous Once Chilton Si, MD       technetium tetrofosmin (TC-MYOVIEW) injection 32.8 millicurie  32.8 millicurie Intravenous Once PRN Chilton Si, MD         Past Medical History:  Diagnosis Date   Ascending aortic aneurysm (HCC)    4.1 cm in 2019   Chronic back pain    Coronary artery disease    Status post CABG 2014 (LIMA to LAD, SVG to ramus, SVG to OM 2)   Dermatophytosis of the body    Eczema    Essential hypertension    GERD (gastroesophageal reflux disease)    Herpes zoster without mention of complication    History of bronchitis    History of claustrophobia    HOH (hard of hearing)    Mixed hyperlipidemia    Prostate cancer (HCC)    Psoriasis and similar disorder    Sinusitis    Type 2 diabetes mellitus (HCC)      Past Surgical History:  Procedure Laterality Date   CATARACT EXTRACTION W/PHACO Left 04/04/2014   Procedure: CATARACT EXTRACTION PHACO AND INTRAOCULAR LENS PLACEMENT (IOC);  Surgeon: Susa Simmonds, MD;  Location: [facility];  Service: Ophthalmology;  Laterality: Left;  CDE:4.49   CATARACT EXTRACTION W/PHACO Right 01/02/2015   Procedure: CATARACT EXTRACTION PHACO AND INTRAOCULAR LENS PLACEMENT; CDE:  6.36;  Surgeon: Susa Simmonds, MD;  Location: [facility];  Service: Ophthalmology;  Laterality: Right;   CIRCUMCISION  2010   CORONARY ARTERY BYPASS GRAFT N/A 08/13/2012   Procedure: CORONARY ARTERY BYPASS GRAFTING (CABG);  Surgeon: Kerin Perna, MD;  Location: [facility] OR;  Service: Open Heart Surgery;  Laterality: N/A;   ENDOVEIN HARVEST OF GREATER SAPHENOUS VEIN Right 08/13/2012   Procedure: ENDOVEIN HARVEST OF GREATER SAPHENOUS VEIN;  Surgeon: Kerin Perna, MD;  Location: [facility] OR;  Service:  Open Heart Surgery;  Laterality: Right;   INTRAOPERATIVE TRANSESOPHAGEAL ECHOCARDIOGRAM N/A 08/13/2012   Procedure: INTRAOPERATIVE TRANSESOPHAGEAL ECHOCARDIOGRAM;  Surgeon: Kerin Perna, MD;  Location: [facility] OR;  Service: Open Heart Surgery;  Laterality: N/A;   LEFT HEART CATHETERIZATION WITH CORONARY ANGIOGRAM N/A 08/07/2012   Procedure: LEFT HEART CATHETERIZATION WITH CORONARY ANGIOGRAM;  Surgeon: Peter M Swaziland, MD;  Location: [facility] CATH LAB;  Service: Cardiovascular;  Laterality: N/A;   RADIOACTIVE SEED IMPLANT N/A 11/25/2017   Procedure: RADIOACTIVE SEED IMPLANT/BRACHYTHERAPY IMPLANT;  Surgeon: Bjorn Pippin, MD;  Location: [facility];  Service: Urology;  Laterality: N/A;   SEED IMPLANT DONE AT   01/2017   TEE WITHOUT CARDIOVERSION N/A 06/12/2022   Procedure: TRANSESOPHAGEAL ECHOCARDIOGRAM;  Surgeon: Chilton Si, MD;  Location: [facility] INVASIVE CV LAB;  Service: Cardiovascular;  Laterality: N/A;       Review of Systems  All other systems reviewed and are negative.  Objective:    Nursing note and vital signs reviewed.     Assessment & Plan:  #MSSA bacteremia with left sternoclavicular osteomyelitis/septic arthrits -Pt states he has a new nodule, painful and warm to touch that he first noticed 6/11 AM. The nodule is growing since 6/11. He noted that he needs to be "heavily sedated for MRI".  He was seen by CTS engaged hospitalization recommended antibiotic management for [facility] joint osteo/SA Pt was direct admitted on 06/26/22 as there was new/growing mass on sternum. He left AMA and did not get sternal MRI. I am unable to fully evaluate him on video visit due to limitations of parties maneuvering the phone, di dnot see mass. Although they sate it is stable. Will extend abx till 7/17 for in  person visit. Again offered admission for CT sternum as he is not amenable to MRI, pt declined. Declined any ED visit for imaging. Fortunately, ESR and CRP are trending down.   -Continue cefazolin till 7/17 (original EOT x6 weeks was 7/5) -CT chest to assess for osteo. Pt not amenable to MRI again. I do see a ct chest  for 07/22/22 at 11 AM by Dr. Maren Beach which we can follow.  -F/U on 7/17 to review CT sternum and assess if PICC should be pulled vs management with CTS   #Possible cervical discitis/osteomyelitis -MRI cervical spine wo con at [facility] did not show osteo  #Medication monitoring #Left PICC -07/10/22 wbc 5.6, glucose 447, Scr 0.68, esr 41, crp 31 ESR/CRP 62/39 on 6/19  Diagnosis: Bacteremia, septic arthritis, osteomyelitis   Culture Result: MSSA   No Known Allergies   OPAT Orders Discharge antibiotics to be given via PICC line Discharge antibiotics: Per pharmacy protocol  Cefazolin 2 g every 8 hours   Duration: 6 weeks   End Date: 07/31/2022   [facility] Care Per Protocol:   Home health RN for IV administration and teaching; PICC line care and labs.     Labs weekly while on IV antibiotics: _xxx_ CBC with differential _xxx_ BMP __ CMP _xxx_ CRP _xxx_ ESR __ Vancomycin trough __ CK   _xxx_ Please pull PIC at completion of IV antibiotics __ Please leave PIC in place until doctor has seen patient or been notified   Fax weekly labs to [phone]   Clinic Follow Up Appt: 7/17   I discussed the assessment and treatment plan with the patient. The patient was provided an opportunity to ask questions and all were answered. The patient agreed with the plan and demonstrated an understanding of the instructions.   The patient was advised to call back or seek an in-person evaluation if the symptoms worsen or if the condition fails to improve as anticipated.   I provided 42  minutes of non-face-to-face time during this encounter.  Follow-up 07/31/22 with Marcos Eke, NP

## 2022-07-16 DIAGNOSIS — M4802 Spinal stenosis, cervical region: Secondary | ICD-10-CM | POA: Diagnosis not present

## 2022-07-16 DIAGNOSIS — M50322 Other cervical disc degeneration at C5-C6 level: Secondary | ICD-10-CM | POA: Diagnosis not present

## 2022-07-16 DIAGNOSIS — M009 Pyogenic arthritis, unspecified: Secondary | ICD-10-CM | POA: Diagnosis not present

## 2022-07-16 DIAGNOSIS — E1169 Type 2 diabetes mellitus with other specified complication: Secondary | ICD-10-CM | POA: Diagnosis not present

## 2022-07-16 DIAGNOSIS — M869 Osteomyelitis, unspecified: Secondary | ICD-10-CM | POA: Diagnosis not present

## 2022-07-17 DIAGNOSIS — M0000 Staphylococcal arthritis, unspecified joint: Secondary | ICD-10-CM | POA: Diagnosis not present

## 2022-07-17 DIAGNOSIS — B9561 Methicillin susceptible Staphylococcus aureus infection as the cause of diseases classified elsewhere: Secondary | ICD-10-CM | POA: Diagnosis not present

## 2022-07-18 DIAGNOSIS — M50322 Other cervical disc degeneration at C5-C6 level: Secondary | ICD-10-CM | POA: Diagnosis not present

## 2022-07-18 DIAGNOSIS — M4802 Spinal stenosis, cervical region: Secondary | ICD-10-CM | POA: Diagnosis not present

## 2022-07-18 DIAGNOSIS — M009 Pyogenic arthritis, unspecified: Secondary | ICD-10-CM | POA: Diagnosis not present

## 2022-07-18 DIAGNOSIS — M869 Osteomyelitis, unspecified: Secondary | ICD-10-CM | POA: Diagnosis not present

## 2022-07-18 DIAGNOSIS — E1169 Type 2 diabetes mellitus with other specified complication: Secondary | ICD-10-CM | POA: Diagnosis not present

## 2022-07-21 DIAGNOSIS — M0000 Staphylococcal arthritis, unspecified joint: Secondary | ICD-10-CM | POA: Diagnosis not present

## 2022-07-22 ENCOUNTER — Ambulatory Visit: Payer: Medicare Other | Admitting: Cardiothoracic Surgery

## 2022-07-22 ENCOUNTER — Inpatient Hospital Stay (HOSPITAL_COMMUNITY)
Admission: EM | Admit: 2022-07-22 | Discharge: 2022-07-31 | DRG: 500 | Disposition: A | Discharge status: Home Health | Payer: Medicare Other | Attending: Cardiothoracic Surgery | Admitting: Cardiothoracic Surgery

## 2022-07-22 ENCOUNTER — Encounter (HOSPITAL_COMMUNITY): Payer: Self-pay

## 2022-07-22 ENCOUNTER — Encounter: Payer: Self-pay | Admitting: *Deleted

## 2022-07-22 ENCOUNTER — Other Ambulatory Visit: Payer: Self-pay

## 2022-07-22 ENCOUNTER — Encounter (HOSPITAL_COMMUNITY): Payer: Self-pay | Admitting: Cardiothoracic Surgery

## 2022-07-22 ENCOUNTER — Ambulatory Visit: Admit: 2022-07-22 | Payer: Medicare Other

## 2022-07-22 ENCOUNTER — Ambulatory Visit: Payer: Self-pay | Admitting: *Deleted

## 2022-07-22 ENCOUNTER — Encounter: Payer: Self-pay | Admitting: Cardiothoracic Surgery

## 2022-07-22 VITALS — BP 186/72 | HR 102 | Resp 20 | Ht 77.0 in | Wt 209.0 lb

## 2022-07-22 DIAGNOSIS — I110 Hypertensive heart disease with heart failure: Secondary | ICD-10-CM | POA: Diagnosis not present

## 2022-07-22 DIAGNOSIS — L02213 Cutaneous abscess of chest wall: Secondary | ICD-10-CM | POA: Diagnosis not present

## 2022-07-22 DIAGNOSIS — I509 Heart failure, unspecified: Secondary | ICD-10-CM | POA: Diagnosis not present

## 2022-07-22 DIAGNOSIS — E1169 Type 2 diabetes mellitus with other specified complication: Secondary | ICD-10-CM | POA: Diagnosis present

## 2022-07-22 DIAGNOSIS — I2510 Atherosclerotic heart disease of native coronary artery without angina pectoris: Secondary | ICD-10-CM | POA: Diagnosis present

## 2022-07-22 DIAGNOSIS — I252 Old myocardial infarction: Secondary | ICD-10-CM

## 2022-07-22 DIAGNOSIS — Z794 Long term (current) use of insulin: Secondary | ICD-10-CM | POA: Diagnosis not present

## 2022-07-22 DIAGNOSIS — I700 Atherosclerosis of aorta: Secondary | ICD-10-CM | POA: Diagnosis not present

## 2022-07-22 DIAGNOSIS — I517 Cardiomegaly: Secondary | ICD-10-CM | POA: Diagnosis not present

## 2022-07-22 DIAGNOSIS — R918 Other nonspecific abnormal finding of lung field: Secondary | ICD-10-CM | POA: Diagnosis not present

## 2022-07-22 DIAGNOSIS — Z79899 Other long term (current) drug therapy: Secondary | ICD-10-CM | POA: Diagnosis not present

## 2022-07-22 DIAGNOSIS — Z951 Presence of aortocoronary bypass graft: Secondary | ICD-10-CM

## 2022-07-22 DIAGNOSIS — I440 Atrioventricular block, first degree: Secondary | ICD-10-CM | POA: Diagnosis not present

## 2022-07-22 DIAGNOSIS — I10 Essential (primary) hypertension: Secondary | ICD-10-CM | POA: Diagnosis not present

## 2022-07-22 DIAGNOSIS — M86112 Other acute osteomyelitis, left shoulder: Secondary | ICD-10-CM | POA: Diagnosis present

## 2022-07-22 DIAGNOSIS — E782 Mixed hyperlipidemia: Secondary | ICD-10-CM | POA: Diagnosis not present

## 2022-07-22 DIAGNOSIS — M898X8 Other specified disorders of bone, other site: Secondary | ICD-10-CM | POA: Diagnosis not present

## 2022-07-22 DIAGNOSIS — B9561 Methicillin susceptible Staphylococcus aureus infection as the cause of diseases classified elsewhere: Secondary | ICD-10-CM | POA: Diagnosis not present

## 2022-07-22 DIAGNOSIS — Z6824 Body mass index (BMI) 24.0-24.9, adult: Secondary | ICD-10-CM | POA: Diagnosis not present

## 2022-07-22 DIAGNOSIS — E1151 Type 2 diabetes mellitus with diabetic peripheral angiopathy without gangrene: Secondary | ICD-10-CM | POA: Diagnosis present

## 2022-07-22 DIAGNOSIS — Z452 Encounter for adjustment and management of vascular access device: Secondary | ICD-10-CM | POA: Diagnosis not present

## 2022-07-22 DIAGNOSIS — M009 Pyogenic arthritis, unspecified: Secondary | ICD-10-CM | POA: Diagnosis not present

## 2022-07-22 DIAGNOSIS — S21102A Unspecified open wound of left front wall of thorax without penetration into thoracic cavity, initial encounter: Secondary | ICD-10-CM | POA: Diagnosis not present

## 2022-07-22 DIAGNOSIS — M25519 Pain in unspecified shoulder: Secondary | ICD-10-CM

## 2022-07-22 DIAGNOSIS — K089 Disorder of teeth and supporting structures, unspecified: Secondary | ICD-10-CM | POA: Diagnosis present

## 2022-07-22 DIAGNOSIS — Z87891 Personal history of nicotine dependence: Secondary | ICD-10-CM | POA: Diagnosis not present

## 2022-07-22 DIAGNOSIS — Z959 Presence of cardiac and vascular implant and graft, unspecified: Secondary | ICD-10-CM

## 2022-07-22 DIAGNOSIS — E43 Unspecified severe protein-calorie malnutrition: Secondary | ICD-10-CM | POA: Diagnosis not present

## 2022-07-22 DIAGNOSIS — E119 Type 2 diabetes mellitus without complications: Secondary | ICD-10-CM | POA: Diagnosis not present

## 2022-07-22 DIAGNOSIS — M2540 Effusion, unspecified joint: Secondary | ICD-10-CM | POA: Diagnosis not present

## 2022-07-22 DIAGNOSIS — M86612 Other chronic osteomyelitis, left shoulder: Secondary | ICD-10-CM | POA: Diagnosis not present

## 2022-07-22 DIAGNOSIS — M00012 Staphylococcal arthritis, left shoulder: Secondary | ICD-10-CM | POA: Diagnosis not present

## 2022-07-22 DIAGNOSIS — Z01818 Encounter for other preprocedural examination: Secondary | ICD-10-CM | POA: Diagnosis not present

## 2022-07-22 DIAGNOSIS — E1165 Type 2 diabetes mellitus with hyperglycemia: Secondary | ICD-10-CM | POA: Diagnosis not present

## 2022-07-22 DIAGNOSIS — I771 Stricture of artery: Secondary | ICD-10-CM | POA: Diagnosis not present

## 2022-07-22 DIAGNOSIS — M25512 Pain in left shoulder: Secondary | ICD-10-CM | POA: Diagnosis present

## 2022-07-22 DIAGNOSIS — R079 Chest pain, unspecified: Secondary | ICD-10-CM | POA: Diagnosis not present

## 2022-07-22 DIAGNOSIS — M869 Osteomyelitis, unspecified: Secondary | ICD-10-CM | POA: Diagnosis not present

## 2022-07-22 LAB — BASIC METABOLIC PANEL
Anion gap: 11 (ref 5–15)
BUN: 11 mg/dL (ref 8–23)
CO2: 25 mmol/L (ref 22–32)
Calcium: 9.1 mg/dL (ref 8.9–10.3)
Chloride: 96 mmol/L — ABNORMAL LOW (ref 98–111)
Creatinine, Ser: 0.66 mg/dL (ref 0.61–1.24)
GFR, Estimated: >60 mL/min (ref >60)
Glucose, Bld: 470 mg/dL — ABNORMAL HIGH (ref 70–99)
Potassium: 5 mmol/L (ref 3.5–5.1)
Sodium: 132 mmol/L — ABNORMAL LOW (ref 135–145)

## 2022-07-22 LAB — CBC
HCT: 33.2 % — ABNORMAL LOW (ref 39.0–52.0)
Hemoglobin: 10.7 g/dL — ABNORMAL LOW (ref 13.0–17.0)
MCH: 26 pg (ref 26.0–34.0)
MCHC: 32.2 g/dL (ref 30.0–36.0)
MCV: 80.6 fL (ref 80.0–100.0)
Platelets: 208 10*3/uL (ref 150–400)
RBC: 4.12 MIL/uL — ABNORMAL LOW (ref 4.22–5.81)
RDW: 13.4 % (ref 11.5–15.5)
WBC: 4.5 10*3/uL (ref 4.0–10.5)
nRBC: 0 % (ref 0.0–0.2)

## 2022-07-22 LAB — GLUCOSE, CAPILLARY
Glucose-Capillary: 493 mg/dL — ABNORMAL HIGH (ref 70–99)
Glucose-Capillary: 332 mg/dL — ABNORMAL HIGH (ref 70–99)

## 2022-07-22 LAB — BPAM RBC: Unit Type and Rh: 5100

## 2022-07-22 LAB — GLUCOSE, RANDOM: Glucose, Bld: 516 mg/dL (ref 70–99)

## 2022-07-22 LAB — TYPE AND SCREEN: Unit division: 0

## 2022-07-22 LAB — PREPARE RBC (CROSSMATCH)

## 2022-07-22 LAB — PROTIME-INR
INR: 1.1 (ref 0.8–1.2)
Prothrombin Time: 14.1 seconds (ref 11.4–15.2)

## 2022-07-22 MED ORDER — ACETAMINOPHEN 500 MG PO TABS: 1000.0000 mg | ORAL_TABLET | Freq: Four times a day (QID) | ORAL | Status: DC | PRN

## 2022-07-22 MED ORDER — HYDROCODONE-ACETAMINOPHEN 5-325 MG PO TABS
1.0000 | ORAL_TABLET | ORAL | Status: DC | PRN
  Administered 2022-07-22: 1 via ORAL
  Filled 2022-07-22: qty 1

## 2022-07-22 MED ORDER — AMLODIPINE BESYLATE 10 MG PO TABS
10.0000 mg | ORAL_TABLET | Freq: Every day | ORAL | Status: DC
  Administered 2022-07-22 – 2022-07-23 (×2): 10 mg via ORAL
  Filled 2022-07-22 (×2): qty 1

## 2022-07-22 MED ORDER — INSULIN DETEMIR 100 UNIT/ML ~~LOC~~ SOLN
15.0000 [IU] | Freq: Two times a day (BID) | SUBCUTANEOUS | Status: DC
  Administered 2022-07-22 – 2022-07-23 (×2): 15 [IU] via SUBCUTANEOUS
  Filled 2022-07-22 (×3): qty 0.15

## 2022-07-22 MED ORDER — ATORVASTATIN CALCIUM 80 MG PO TABS
80.0000 mg | ORAL_TABLET | Freq: Every evening | ORAL | Status: DC
  Administered 2022-07-22: 80 mg via ORAL
  Filled 2022-07-22: qty 1

## 2022-07-22 MED ORDER — DOCUSATE SODIUM 100 MG PO CAPS
100.0000 mg | ORAL_CAPSULE | Freq: Every day | ORAL | Status: DC
  Administered 2022-07-22 – 2022-07-23 (×2): 100 mg via ORAL
  Filled 2022-07-22 (×2): qty 1

## 2022-07-22 MED ORDER — PANTOPRAZOLE SODIUM 40 MG IV SOLR
40.0000 mg | Freq: Every day | INTRAVENOUS | Status: DC
  Administered 2022-07-22: 40 mg via INTRAVENOUS
  Filled 2022-07-22: qty 10

## 2022-07-22 MED ORDER — TRAMADOL HCL 50 MG PO TABS: 50.0000 mg | ORAL_TABLET | Freq: Four times a day (QID) | ORAL | Status: DC | PRN

## 2022-07-22 MED ORDER — FENTANYL CITRATE PF 50 MCG/ML IJ SOSY: 12.5000 ug | PREFILLED_SYRINGE | INTRAMUSCULAR | Status: DC | PRN

## 2022-07-22 MED ORDER — GLUCERNA SHAKE PO LIQD: 237.0000 mL | Freq: Three times a day (TID) | ORAL | Status: DC

## 2022-07-22 MED ORDER — CARVEDILOL 12.5 MG PO TABS
12.5000 mg | ORAL_TABLET | Freq: Two times a day (BID) | ORAL | Status: DC
  Administered 2022-07-22 – 2022-07-23 (×2): 12.5 mg via ORAL
  Filled 2022-07-22 (×2): qty 1

## 2022-07-22 MED ORDER — INSULIN DETEMIR 100 UNIT/ML ~~LOC~~ SOLN
10.0000 [IU] | Freq: Every day | SUBCUTANEOUS | Status: DC
  Filled 2022-07-22: qty 0.1

## 2022-07-22 MED ORDER — VANCOMYCIN HCL IN DEXTROSE 1-5 GM/200ML-% IV SOLN
1000.0000 mg | Freq: Once | INTRAVENOUS | Status: AC
  Administered 2022-07-23: 1000 mg via INTRAVENOUS
  Filled 2022-07-22: qty 200

## 2022-07-22 MED ORDER — ONDANSETRON 4 MG PO TBDP: 4.0000 mg | ORAL_TABLET | Freq: Four times a day (QID) | ORAL | Status: DC | PRN

## 2022-07-22 MED ORDER — INSULIN DETEMIR 100 UNIT/ML ~~LOC~~ SOLN
20.0000 [IU] | Freq: Every day | SUBCUTANEOUS | Status: DC
Start: 2022-07-22 — End: 2022-07-22

## 2022-07-22 MED ORDER — LISINOPRIL 10 MG PO TABS
10.0000 mg | ORAL_TABLET | Freq: Every day | ORAL | Status: DC
  Administered 2022-07-22 – 2022-07-23 (×2): 10 mg via ORAL
  Filled 2022-07-22 (×2): qty 1

## 2022-07-22 MED ORDER — VANCOMYCIN VARIABLE DOSE PER UNSTABLE RENAL FUNCTION (PHARMACIST DOSING): Status: DC

## 2022-07-22 MED ORDER — VANCOMYCIN HCL IN DEXTROSE 1-5 GM/200ML-% IV SOLN
1000.0000 mg | INTRAVENOUS | Status: AC
  Administered 2022-07-23: 1000 mg via INTRAVENOUS
  Filled 2022-07-22: qty 200

## 2022-07-22 MED ORDER — SODIUM CHLORIDE 0.9 % IV SOLN: INTRAVENOUS | Status: DC

## 2022-07-22 MED ORDER — INSULIN ASPART 100 UNIT/ML IJ SOLN
0.0000 [IU] | Freq: Three times a day (TID) | INTRAMUSCULAR | Status: DC
  Administered 2022-07-22: 15 [IU] via SUBCUTANEOUS
  Administered 2022-07-23: 11 [IU] via SUBCUTANEOUS
  Filled 2022-07-22: qty 1

## 2022-07-22 MED ORDER — CHLORHEXIDINE GLUCONATE CLOTH 2 % EX PADS
6.0000 | MEDICATED_PAD | Freq: Every day | CUTANEOUS | Status: DC
  Administered 2022-07-22 – 2022-07-30 (×8): 6 via TOPICAL

## 2022-07-22 MED ORDER — BISACODYL 10 MG RE SUPP: 10.0000 mg | Freq: Every day | RECTAL | Status: DC | PRN

## 2022-07-22 MED ORDER — VANCOMYCIN HCL 1750 MG/350ML IV SOLN
1750.0000 mg | INTRAVENOUS | Status: AC
  Administered 2022-07-22: 1750 mg via INTRAVENOUS
  Filled 2022-07-22: qty 350

## 2022-07-22 MED ORDER — INSULIN DETEMIR 100 UNIT/ML ~~LOC~~ SOLN
35.0000 [IU] | Freq: Every day | SUBCUTANEOUS | Status: DC
  Filled 2022-07-22: qty 0.35

## 2022-07-22 MED ORDER — ENSURE ENLIVE PO LIQD
237.0000 mL | Freq: Two times a day (BID) | ORAL | Status: DC
  Administered 2022-07-22: 237 mL via ORAL

## 2022-07-22 MED ORDER — INSULIN ASPART 100 UNIT/ML IJ SOLN
10.0000 [IU] | Freq: Once | INTRAMUSCULAR | Status: AC
  Administered 2022-07-22: 10 [IU] via SUBCUTANEOUS

## 2022-07-22 MED ORDER — ONDANSETRON HCL 4 MG/2ML IJ SOLN: 4.0000 mg | Freq: Four times a day (QID) | INTRAMUSCULAR | Status: DC | PRN

## 2022-07-22 NOTE — Progress Notes (Signed)
HPI: Patient presents with pain and swelling over the left sternoclavicular joint and history of recent hospitalization.  He was admitted for the same symptoms Jun 10 2022.  He had MSSA bacteremia and a CT scan and MRI showed inflammatory changes in the left sternoclavicular joint with edema but no specific abscess and no evidence of cortical bone destruction.  An extended course of IV Ancef was recommended but the patient left AMA.  His home IV antibiotics were supervised by his primary care physician.  Blood cultures were negative in the hospital before he left AMA.  The patient is a diabetic with prior history of CABG x 310 years ago.  He now complains of severe left shoulder discomfort with poor range of motion due to pain and redness and swelling over the left sternoclavicular joint.  A follow-up CT scan was performed today.  I reviewed the images and discussed the findings with the patient and family.  He is now with a large abscess with cortical bone destruction involving the head of the left clavicle.  There is no extension into the mediastinum but there is soft tissue abscess beneath the skin measuring 3 cm.  I discussed the findings with the patient and family.  They understand that despite IV antibiotics the infection has progressed.  The patient does not appear toxic however blood cultures will need to be repeated.  Patient understands he will need admission to the hospital for surgery with debridement and wound VAC therapy and will probably be hospitalized for at least 2 weeks.  The origin of the original bacteremia is probably related to chronic dental disease.  Current Outpatient Medications  Medication Sig Dispense Refill   acetaminophen (TYLENOL) 500 MG tablet Take 1,000 mg by mouth every 6 (six) hours as needed for moderate pain.     amLODipine (NORVASC) 10 MG tablet Take 1 tablet (10 mg total) by mouth daily. 30 tablet 6   atorvastatin (LIPITOR) 80 MG tablet Take 1 tablet (80 mg  total) by mouth daily. 30 tablet 3   carvedilol (COREG) 12.5 MG tablet Take 1 tablet (12.5 mg total) by mouth 2 (two) times daily. 30 tablet 3   HYDROcodone-acetaminophen (NORCO/VICODIN) 5-325 MG tablet Take 1 tablet by mouth every 4 (four) hours as needed for moderate pain.     insulin glargine (LANTUS) 100 UNIT/ML Solostar Pen Inject 70-75 Units into the skin at bedtime.     lisinopril (ZESTRIL) 10 MG tablet Take 1 tablet (10 mg total) by mouth daily. 30 tablet 3   Multiple Vitamins-Minerals (ICAPS AREDS 2 PO) Take 1 capsule by mouth 2 (two) times daily.     nitroGLYCERIN (NITROSTAT) 0.4 MG SL tablet Place 1 tablet (0.4 mg total) under the tongue every 5 (five) minutes x 3 doses as needed for chest pain (if no relief after 3rd dose, proceed to ED for evaluation or call 911). 25 tablet 3   NOVOLOG FLEXPEN 100 UNIT/ML FlexPen INJECT 25-31 UNITS INTO THE SKIN THREE TIMES DAILY BEFORE MEALS (Patient taking differently: Inject 50 Units into the skin in the morning, at noon, and at bedtime.) 30 mL 0   No current facility-administered medications for this visit.   Facility-Administered Medications Ordered in Other Visits  Medication Dose Route Frequency Provider Last Rate Last Admin   regadenoson (LEXISCAN) injection SOLN 0.4 mg  0.4 mg Intravenous Once Chilton Si, MD       technetium tetrofosmin (TC-MYOVIEW) injection 32.8 millicurie  32.8 millicurie Intravenous Once PRN Chilton Si, MD  Physical Exam: Blood pressure (!) 186/72, pulse (!) 102, resp. rate 20, height 6\' 5"  (1.956 m), weight 209 lb (94.8 kg), SpO2 95 %.   \     Physical Exam   Gen-alert and appropriate, frustrated that the infection has not responded to antibiotics    HEENT: Normocephalic pupils equal , dentition with broken necrotic teeth Neck: Supple without JVD, adenopathy, or bruit Chest: Clear to auscultation, symmetrical breath sounds, no rhonchi,  tenderness with erythema and swelling over the left  sternoclavicular joint, somewhat fluctuant.              Cardiovascular: Regular rate and rhythm, no murmur, no gallop, peripheral pulses             palpable in all extremities Abdomen:  Soft, nontender, no palpable mass or organomegaly Extremities: Warm, well-perfused, no clubbing cyanosis edema .  Limited range of motion of the left shoulder due to pain.  No swelling of the left hand.              no venous stasis changes of the legs Rectal/GU: Deferred Neuro: Grossly non--focal and symmetrical throughout Skin: Clean and dry without rash or ulceration  Diagnostic Tests: CT scan performed today personally reviewed and results as noted above which were discussed with the patient and family.  He has a significant left sternoclavicular joint abscess with some bony destruction of the clavicular head.  Impression: Left sternoclavicular joint infection with MSSA nonresponsive to extended IV antibiotics through PICC line (Ancef).  Patient will need to be admitted for surgical debridement, wound VAC therapy and we will switch the IV antibiotic coverage to vancomycin.  Blood cultures will be repeated prior to surgery.  Plan: Surgery will be scheduled tomorrow for the left sternoclavicular joint debridement.  The patient understands that this is a significant operation with general anesthesia, risk of bleeding, recurrent infection, organ failure,  and will require probably a lengthy hospital stay.   Lovett Sox, MD [facility] [phone]

## 2022-07-22 NOTE — H&P (Signed)
HPI: Patient presents with pain and swelling over the left sternoclavicular joint and history of recent hospitalization.  He was admitted for the same symptoms Jun 10 2022.  He had MSSA bacteremia and a CT scan and MRI showed inflammatory changes in the left sternoclavicular joint with edema but no specific abscess and no evidence of cortical bone destruction.  An extended course of IV Ancef was recommended but the patient left AMA.  His home IV antibiotics were supervised by his primary care physician.  Blood cultures were negative in the hospital before he left AMA.  The patient is a diabetic with prior history of CABG x 3   10 years ago.  He now complains of severe left shoulder discomfort with poor range of motion due to pain and redness and swelling over the left sternoclavicular joint.  A follow-up CT scan was performed today.  I reviewed the images and discussed the findings with the patient and family.  He is now with a large abscess with cortical bone destruction involving the head of the left clavicle.  There is no extension into the mediastinum but there is soft tissue abscess beneath the skin measuring 3 cm.  I discussed the findings with the patient and family.  They understand that despite IV antibiotics the infection has progressed.  The patient does not appear toxic however blood cultures will need to be repeated.  Patient understands he will need admission to the hospital for surgery with debridement and wound VAC therapy and will probably be hospitalized for at least 2 weeks.  The origin of the original bacteremia is probably related to chronic dental disease.  Current Facility-Administered Medications  Medication Dose Route Frequency Provider Last Rate Last Admin   0.9 %  sodium chloride infusion   Intravenous Continuous Lovett Sox, MD       acetaminophen (TYLENOL) tablet 1,000 mg  1,000 mg Oral Q6H PRN Doree Fudge M, PA-C       amLODipine (NORVASC) tablet 10 mg  10  mg Oral Daily Doree Fudge M, PA-C   10 mg at 07/22/22 1459   atorvastatin (LIPITOR) tablet 80 mg  80 mg Oral QPM Doree Fudge M, PA-C   80 mg at 07/22/22 1708   bisacodyl (DULCOLAX) suppository 10 mg  10 mg Rectal Daily PRN Doree Fudge M, PA-C       carvedilol (COREG) tablet 12.5 mg  12.5 mg Oral BID WC Doree Fudge M, PA-C   12.5 mg at 07/22/22 1643   Chlorhexidine Gluconate Cloth 2 % PADS 6 each  6 each Topical Daily Lovett Sox, MD   6 each at 07/22/22 1631   docusate sodium (COLACE) capsule 100 mg  100 mg Oral Daily Doree Fudge M, PA-C   100 mg at 07/22/22 1459   [START ON 07/23/2022] feeding supplement (GLUCERNA SHAKE) (GLUCERNA SHAKE) liquid 237 mL  237 mL Oral TID WC Lovett Sox, MD       fentaNYL (SUBLIMAZE) injection 12.5 mcg  12.5 mcg Intravenous Q2H PRN Doree Fudge M, PA-C       HYDROcodone-acetaminophen (NORCO/VICODIN) 5-325 MG per tablet 1 tablet  1 tablet Oral Q4H PRN Doree Fudge M, PA-C       insulin aspart (novoLOG) injection 0-15 Units  0-15 Units Subcutaneous TID WC Doree Fudge M, PA-C   15 Units at 07/22/22 1642   insulin detemir (LEVEMIR) injection 15 Units  15 Units Subcutaneous BID Lovett Sox, MD       lisinopril (ZESTRIL) tablet 10 mg  10 mg Oral Daily Doree Fudge M, PA-C   10 mg at 07/22/22 1459   ondansetron (ZOFRAN-ODT) disintegrating tablet 4 mg  4 mg Oral Q6H PRN Doree Fudge M, PA-C       Or   ondansetron [facility]) injection 4 mg  4 mg Intravenous Q6H PRN Doree Fudge M, PA-C       pantoprazole (PROTONIX) injection 40 mg  40 mg Intravenous QHS Zimmerman, Donielle M, PA-C       traMADol Janean Sark) tablet 50 mg  50 mg Oral Q6H PRN Doree Fudge M, PA-C       [START ON 07/23/2022] vancomycin (VANCOCIN) IVPB 1000 mg/200 mL premix  1,000 mg Intravenous On Call to OR Lovett Sox, MD       vancomycin Luna Kitchens) IVPB 1750 mg/350 mL  1,750 mg Intravenous NOW Titus Mould, RPH  175 mL/hr at 07/22/22 1536 Infusion Verify at 07/22/22 1536   Facility-Administered Medications Ordered in Other Encounters  Medication Dose Route Frequency Provider Last Rate Last Admin   regadenoson (LEXISCAN) injection SOLN 0.4 mg  0.4 mg Intravenous Once Chilton Si, MD       technetium tetrofosmin (TC-MYOVIEW) injection 32.8 millicurie  32.8 millicurie Intravenous Once PRN Chilton Si, MD         Physical Exam: Blood pressure (!) 186/72, pulse (!) 102, resp. rate 20, height 6\' 5"  (1.956 m), weight 209 lb (94.8 kg), SpO2 95 %.   \     Physical Exam   Gen-alert and appropriate, frustrated that the infection has not responded to antibiotics    HEENT: Normocephalic pupils equal , dentition with broken necrotic teeth Neck: Supple without JVD, adenopathy, or bruit Chest: Clear to auscultation, symmetrical breath sounds, no rhonchi,  tenderness with erythema and swelling over the left sternoclavicular joint, somewhat fluctuant.              Cardiovascular: Regular rate and rhythm, no murmur, no gallop, peripheral pulses             palpable in all extremities Abdomen:  Soft, nontender, no palpable mass or organomegaly Extremities: Warm, well-perfused, no clubbing cyanosis edema .  Limited range of motion of the left shoulder due to pain.  No swelling of the left hand.              no venous stasis changes of the legs Rectal/GU: Deferred Neuro: Grossly non--focal and symmetrical throughout Skin: Clean and dry without rash or ulceration  Diagnostic Tests: CT scan performed today personally reviewed and results as noted above which were discussed with the patient and family.  He has a significant left sternoclavicular joint abscess with some bony destruction of the clavicular head.  Impression: Left sternoclavicular joint infection with MSSA nonresponsive to extended IV antibiotics through PICC line (Ancef).  Patient will need to be admitted for surgical debridement, wound  VAC therapy and we will switch the IV antibiotic coverage to vancomycin.  Blood cultures will be repeated prior to surgery.  Plan: Surgery will be scheduled tomorrow for the left sternoclavicular joint debridement.  The patient understands that this is a significant operation with general anesthesia, risk of bleeding, recurrent infection, organ failure,  and will require probably a lengthy hospital stay.   Lovett Sox, MD [facility] [phone]

## 2022-07-22 NOTE — Progress Notes (Signed)
Date and time results received: 07/22/22 2126H   Test: Glucose Critical Value: 516  Name of Provider Notified: Dr. Cliffton Asters  Orders Received? Or Actions Taken?: Rechecked CBG, 332mg /dL. Orders Received - See Orders for details

## 2022-07-22 NOTE — Plan of Care (Signed)

## 2022-07-22 NOTE — Progress Notes (Signed)
Pharmacy Antibiotic Note  George [name] is a 77 y.o. male admitted on 07/22/2022 with  Left sternoclavicular joint infection with MSSA nonresponsive to extended IV antibiotics through PICC line (Ancef).  Plan for surgical debridement, wound VAC therapy and abx will be switched to vancomycin . Repeat cultures post surgery.  Pharmacy has been consulted for Vancomycin dosing.  Plan: Vancomycin 1750mg  IV now then 1000 mg IV once at ~0200. Vanc 1gm Pre-op ordered by MD - should be given ~1100. Will f/u renal function, micro data, and pt's clinical condition Will f/u post OR 7/9 afternoon to put in further vancomycin doses   Height: 6\' 5"  (195.6 cm) Weight: 94.8 kg (208 lb 15.9 oz) IBW/kg (Calculated) : 89.1  Temp (24hrs), Avg:98.2 F (36.8 C), Min:98.2 F (36.8 C), Max:98.2 F (36.8 C)  Recent Labs  Lab 07/22/22 1436  WBC 4.5    CrCl cannot be calculated (Patient's most recent lab result is older than the maximum 21 days allowed.).    No Known Allergies  Antimicrobials this admission: PTA Ancef >>new plan stop date 7/17 per ID clinic note 7/1 (original stop date 7/5 for total of 6 wks) 7/8 Vanc >>   Microbiology results: 7/8 BCx:   Thank you for allowing pharmacy to be a part of this patient's care.  Christoper Fabian, PharmD, BCPS Please see amion for complete clinical pharmacist phone list 07/22/2022 3:11 PM

## 2022-07-23 ENCOUNTER — Inpatient Hospital Stay (HOSPITAL_COMMUNITY): Payer: Medicare Other

## 2022-07-23 ENCOUNTER — Inpatient Hospital Stay (HOSPITAL_COMMUNITY): Payer: Medicare Other | Admitting: Anesthesiology

## 2022-07-23 ENCOUNTER — Encounter (HOSPITAL_COMMUNITY): Payer: Self-pay | Admitting: Cardiothoracic Surgery

## 2022-07-23 ENCOUNTER — Encounter (HOSPITAL_COMMUNITY)
Admission: EM | Disposition: A | Discharge status: Home Health | Payer: Self-pay | Source: Home / Self Care | Attending: Cardiothoracic Surgery

## 2022-07-23 DIAGNOSIS — M009 Pyogenic arthritis, unspecified: Secondary | ICD-10-CM

## 2022-07-23 DIAGNOSIS — I2510 Atherosclerotic heart disease of native coronary artery without angina pectoris: Secondary | ICD-10-CM | POA: Diagnosis not present

## 2022-07-23 DIAGNOSIS — Z87891 Personal history of nicotine dependence: Secondary | ICD-10-CM

## 2022-07-23 DIAGNOSIS — I509 Heart failure, unspecified: Secondary | ICD-10-CM

## 2022-07-23 DIAGNOSIS — M86612 Other chronic osteomyelitis, left shoulder: Secondary | ICD-10-CM

## 2022-07-23 DIAGNOSIS — I110 Hypertensive heart disease with heart failure: Secondary | ICD-10-CM

## 2022-07-23 HISTORY — PX: APPLICATION OF WOUND VAC: SHX5189

## 2022-07-23 HISTORY — PX: STERNAL WOUND DEBRIDEMENT: SHX1058

## 2022-07-23 LAB — URINALYSIS, ROUTINE W REFLEX MICROSCOPIC
Bacteria, UA: NONE SEEN
Bilirubin Urine: NEGATIVE
Glucose, UA: >=500 mg/dL — AB
Hgb urine dipstick: NEGATIVE
Ketones, ur: NEGATIVE mg/dL
Leukocytes,Ua: NEGATIVE
Nitrite: NEGATIVE
Protein, ur: NEGATIVE mg/dL
Specific Gravity, Urine: 1.013 (ref 1.005–1.030)
pH: 5 (ref 5.0–8.0)

## 2022-07-23 LAB — COMPREHENSIVE METABOLIC PANEL
ALT: 11 U/L (ref 0–44)
AST: 13 U/L — ABNORMAL LOW (ref 15–41)
Albumin: 2.8 g/dL — ABNORMAL LOW (ref 3.5–5.0)
Alkaline Phosphatase: 100 U/L (ref 38–126)
Anion gap: 9 (ref 5–15)
BUN: 10 mg/dL (ref 8–23)
CO2: 28 mmol/L (ref 22–32)
Calcium: 8.8 mg/dL — ABNORMAL LOW (ref 8.9–10.3)
Chloride: 97 mmol/L — ABNORMAL LOW (ref 98–111)
Creatinine, Ser: 0.57 mg/dL — ABNORMAL LOW (ref 0.61–1.24)
GFR, Estimated: >60 mL/min (ref >60)
Glucose, Bld: 239 mg/dL — ABNORMAL HIGH (ref 70–99)
Potassium: 3.7 mmol/L (ref 3.5–5.1)
Sodium: 134 mmol/L — ABNORMAL LOW (ref 135–145)
Total Bilirubin: 0.6 mg/dL (ref 0.3–1.2)
Total Protein: 6.7 g/dL (ref 6.5–8.1)

## 2022-07-23 LAB — GLUCOSE, CAPILLARY
Glucose-Capillary: 322 mg/dL — ABNORMAL HIGH (ref 70–99)
Glucose-Capillary: 307 mg/dL — ABNORMAL HIGH (ref 70–99)
Glucose-Capillary: 227 mg/dL — ABNORMAL HIGH (ref 70–99)
Glucose-Capillary: 211 mg/dL — ABNORMAL HIGH (ref 70–99)
Glucose-Capillary: 274 mg/dL — ABNORMAL HIGH (ref 70–99)

## 2022-07-23 LAB — CBC
HCT: 30.8 % — ABNORMAL LOW (ref 39.0–52.0)
Hemoglobin: 10 g/dL — ABNORMAL LOW (ref 13.0–17.0)
MCH: 26.7 pg (ref 26.0–34.0)
MCHC: 32.5 g/dL (ref 30.0–36.0)
MCV: 82.4 fL (ref 80.0–100.0)
Platelets: 239 10*3/uL (ref 150–400)
RBC: 3.74 MIL/uL — ABNORMAL LOW (ref 4.22–5.81)
RDW: 13.3 % (ref 11.5–15.5)
WBC: 4.1 10*3/uL (ref 4.0–10.5)
nRBC: 0 % (ref 0.0–0.2)

## 2022-07-23 LAB — SURGICAL PCR SCREEN
MRSA, PCR: NEGATIVE
Staphylococcus aureus: NEGATIVE

## 2022-07-23 SURGERY — DEBRIDEMENT, WOUND, STERNUM
Anesthesia: General | Laterality: Left

## 2022-07-23 MED ORDER — TRAMADOL HCL 50 MG PO TABS: 50.0000 mg | ORAL_TABLET | Freq: Four times a day (QID) | ORAL | Status: DC | PRN

## 2022-07-23 MED ORDER — ACETAMINOPHEN 160 MG/5ML PO SOLN
1000.0000 mg | Freq: Four times a day (QID) | ORAL | Status: AC
  Filled 2022-07-23: qty 40.6

## 2022-07-23 MED ORDER — VANCOMYCIN HCL IN DEXTROSE 1-5 GM/200ML-% IV SOLN
1000.0000 mg | Freq: Two times a day (BID) | INTRAVENOUS | Status: DC
  Administered 2022-07-23 – 2022-07-24 (×2): 1000 mg via INTRAVENOUS
  Filled 2022-07-23 (×4): qty 200

## 2022-07-23 MED ORDER — OXYCODONE HCL 5 MG/5ML PO SOLN: 5.0000 mg | Freq: Once | ORAL | Status: DC | PRN

## 2022-07-23 MED ORDER — ORAL CARE MOUTH RINSE: 15.0000 mL | Freq: Once | OROMUCOSAL | Status: AC

## 2022-07-23 MED ORDER — PANTOPRAZOLE SODIUM 40 MG PO TBEC
40.0000 mg | DELAYED_RELEASE_TABLET | Freq: Every day | ORAL | Status: DC
  Administered 2022-07-24 – 2022-07-31 (×8): 40 mg via ORAL
  Filled 2022-07-23 (×8): qty 1

## 2022-07-23 MED ORDER — INSULIN ASPART 100 UNIT/ML IJ SOLN
0.0000 [IU] | INTRAMUSCULAR | Status: DC | PRN
  Administered 2022-07-23: 5 [IU] via SUBCUTANEOUS

## 2022-07-23 MED ORDER — INSULIN ASPART 100 UNIT/ML IJ SOLN
0.0000 [IU] | Freq: Three times a day (TID) | INTRAMUSCULAR | Status: DC
  Administered 2022-07-23: 8 [IU] via SUBCUTANEOUS
  Administered 2022-07-23: 12 [IU] via SUBCUTANEOUS
  Administered 2022-07-24: 8 [IU] via SUBCUTANEOUS
  Administered 2022-07-24 (×3): 12 [IU] via SUBCUTANEOUS
  Administered 2022-07-25 (×2): 8 [IU] via SUBCUTANEOUS
  Administered 2022-07-25: 2 [IU] via SUBCUTANEOUS
  Administered 2022-07-26: 12 [IU] via SUBCUTANEOUS
  Administered 2022-07-26 (×2): 8 [IU] via SUBCUTANEOUS
  Administered 2022-07-27 (×2): 2 [IU] via SUBCUTANEOUS
  Administered 2022-07-27: 8 [IU] via SUBCUTANEOUS
  Administered 2022-07-28: 2 [IU] via SUBCUTANEOUS
  Administered 2022-07-28: 4 [IU] via SUBCUTANEOUS
  Administered 2022-07-28: 8 [IU] via SUBCUTANEOUS
  Administered 2022-07-29: 4 [IU] via SUBCUTANEOUS
  Administered 2022-07-29: 16 [IU] via SUBCUTANEOUS
  Administered 2022-07-29: 8 [IU] via SUBCUTANEOUS
  Administered 2022-07-30: 16 [IU] via SUBCUTANEOUS
  Administered 2022-07-30 – 2022-07-31 (×3): 8 [IU] via SUBCUTANEOUS

## 2022-07-23 MED ORDER — ACETAMINOPHEN 500 MG PO TABS
1000.0000 mg | ORAL_TABLET | Freq: Once | ORAL | Status: AC
  Administered 2022-07-23: 1000 mg via ORAL
  Filled 2022-07-23: qty 2

## 2022-07-23 MED ORDER — PHENYLEPHRINE 80 MCG/ML (10ML) SYRINGE FOR IV PUSH (FOR BLOOD PRESSURE SUPPORT)
PREFILLED_SYRINGE | INTRAVENOUS | Status: AC
  Filled 2022-07-23: qty 10

## 2022-07-23 MED ORDER — HYDRALAZINE HCL 20 MG/ML IJ SOLN
10.0000 mg | INTRAMUSCULAR | Status: DC | PRN
  Administered 2022-07-25 – 2022-07-29 (×2): 10 mg via INTRAVENOUS
  Filled 2022-07-23 (×3): qty 1

## 2022-07-23 MED ORDER — ACETAMINOPHEN 500 MG PO TABS
1000.0000 mg | ORAL_TABLET | Freq: Four times a day (QID) | ORAL | Status: AC
  Administered 2022-07-23 – 2022-07-28 (×16): 1000 mg via ORAL
  Filled 2022-07-23 (×16): qty 2

## 2022-07-23 MED ORDER — LACTATED RINGERS IV SOLN: INTRAVENOUS | Status: DC

## 2022-07-23 MED ORDER — OXYCODONE HCL 5 MG PO TABS: 5.0000 mg | ORAL_TABLET | Freq: Once | ORAL | Status: DC | PRN

## 2022-07-23 MED ORDER — FENTANYL CITRATE PF 50 MCG/ML IJ SOSY
25.0000 ug | PREFILLED_SYRINGE | INTRAMUSCULAR | Status: DC | PRN
  Administered 2022-07-23: 50 ug via INTRAVENOUS
  Filled 2022-07-23: qty 1

## 2022-07-23 MED ORDER — HYDROMORPHONE HCL 1 MG/ML IJ SOLN: 0.2500 mg | INTRAMUSCULAR | Status: DC | PRN

## 2022-07-23 MED ORDER — ONDANSETRON HCL 4 MG/2ML IJ SOLN
INTRAMUSCULAR | Status: DC | PRN
  Administered 2022-07-23: 4 mg via INTRAVENOUS

## 2022-07-23 MED ORDER — EPHEDRINE SULFATE-NACL 50-0.9 MG/10ML-% IV SOSY
PREFILLED_SYRINGE | INTRAVENOUS | Status: DC | PRN
  Administered 2022-07-23 (×2): 10 mg via INTRAVENOUS
  Administered 2022-07-23: 5 mg via INTRAVENOUS

## 2022-07-23 MED ORDER — ORAL CARE MOUTH RINSE: 15.0000 mL | OROMUCOSAL | Status: DC | PRN

## 2022-07-23 MED ORDER — PROPOFOL 10 MG/ML IV BOLUS
INTRAVENOUS | Status: AC
  Filled 2022-07-23: qty 20

## 2022-07-23 MED ORDER — INSULIN ASPART 100 UNIT/ML IJ SOLN
10.0000 [IU] | Freq: Once | INTRAMUSCULAR | Status: AC
  Administered 2022-07-23: 10 [IU] via SUBCUTANEOUS

## 2022-07-23 MED ORDER — GABAPENTIN 300 MG PO CAPS: 300.0000 mg | ORAL_CAPSULE | Freq: Two times a day (BID) | ORAL | Status: DC

## 2022-07-23 MED ORDER — PHENYLEPHRINE 80 MCG/ML (10ML) SYRINGE FOR IV PUSH (FOR BLOOD PRESSURE SUPPORT)
PREFILLED_SYRINGE | INTRAVENOUS | Status: DC | PRN
  Administered 2022-07-23: 400 ug via INTRAVENOUS
  Administered 2022-07-23: 160 ug via INTRAVENOUS
  Administered 2022-07-23: 240 ug via INTRAVENOUS
  Administered 2022-07-23: 160 ug via INTRAVENOUS
  Administered 2022-07-23: 400 ug via INTRAVENOUS

## 2022-07-23 MED ORDER — METOCLOPRAMIDE HCL 5 MG/ML IJ SOLN
10.0000 mg | Freq: Four times a day (QID) | INTRAMUSCULAR | Status: DC
  Administered 2022-07-23 – 2022-07-24 (×4): 10 mg via INTRAVENOUS
  Filled 2022-07-23 (×4): qty 2

## 2022-07-23 MED ORDER — SUGAMMADEX SODIUM 200 MG/2ML IV SOLN
INTRAVENOUS | Status: DC | PRN
  Administered 2022-07-23: 200 mg via INTRAVENOUS

## 2022-07-23 MED ORDER — SENNOSIDES-DOCUSATE SODIUM 8.6-50 MG PO TABS
1.0000 | ORAL_TABLET | Freq: Every day | ORAL | Status: DC
  Administered 2022-07-23 – 2022-07-30 (×8): 1 via ORAL
  Filled 2022-07-23 (×8): qty 1

## 2022-07-23 MED ORDER — BISACODYL 5 MG PO TBEC
10.0000 mg | DELAYED_RELEASE_TABLET | Freq: Every day | ORAL | Status: DC
  Administered 2022-07-23 – 2022-07-31 (×6): 10 mg via ORAL
  Filled 2022-07-23 (×9): qty 2

## 2022-07-23 MED ORDER — OXYCODONE HCL 5 MG PO TABS
5.0000 mg | ORAL_TABLET | ORAL | Status: DC | PRN
  Administered 2022-07-23 – 2022-07-27 (×8): 10 mg via ORAL
  Filled 2022-07-23: qty 2
  Filled 2022-07-23: qty 1
  Filled 2022-07-23 (×6): qty 2
  Filled 2022-07-23: qty 1

## 2022-07-23 MED ORDER — ONDANSETRON HCL 4 MG/2ML IJ SOLN: 4.0000 mg | Freq: Four times a day (QID) | INTRAMUSCULAR | Status: DC | PRN

## 2022-07-23 MED ORDER — FENTANYL CITRATE (PF) 100 MCG/2ML IJ SOLN
INTRAMUSCULAR | Status: DC | PRN
  Administered 2022-07-23 (×2): 50 ug via INTRAVENOUS

## 2022-07-23 MED ORDER — 0.9 % SODIUM CHLORIDE (POUR BTL) OPTIME
TOPICAL | Status: DC | PRN
  Administered 2022-07-23: 1000 mL

## 2022-07-23 MED ORDER — ONDANSETRON HCL 4 MG/2ML IJ SOLN: 4.0000 mg | Freq: Once | INTRAMUSCULAR | Status: DC | PRN

## 2022-07-23 MED ORDER — ENOXAPARIN SODIUM 40 MG/0.4ML IJ SOSY
40.0000 mg | PREFILLED_SYRINGE | Freq: Every day | INTRAMUSCULAR | Status: DC
  Administered 2022-07-24 – 2022-07-25 (×2): 40 mg via SUBCUTANEOUS
  Filled 2022-07-23 (×3): qty 0.4

## 2022-07-23 MED ORDER — CHLORHEXIDINE GLUCONATE 0.12 % MT SOLN: 15.0000 mL | Freq: Once | OROMUCOSAL | Status: AC

## 2022-07-23 MED ORDER — LIDOCAINE 2% (20 MG/ML) 5 ML SYRINGE
INTRAMUSCULAR | Status: DC | PRN
  Administered 2022-07-23: 100 mg via INTRAVENOUS

## 2022-07-23 MED ORDER — PROPOFOL 10 MG/ML IV BOLUS
INTRAVENOUS | Status: DC | PRN
  Administered 2022-07-23: 100 mg via INTRAVENOUS

## 2022-07-23 MED ORDER — GABAPENTIN 300 MG PO CAPS
300.0000 mg | ORAL_CAPSULE | Freq: Every day | ORAL | Status: AC
  Administered 2022-07-23 – 2022-07-25 (×3): 300 mg via ORAL
  Filled 2022-07-23 (×3): qty 1

## 2022-07-23 MED ORDER — ROCURONIUM BROMIDE 10 MG/ML (PF) SYRINGE
PREFILLED_SYRINGE | INTRAVENOUS | Status: DC | PRN
  Administered 2022-07-23: 70 mg via INTRAVENOUS

## 2022-07-23 MED ORDER — CHLORHEXIDINE GLUCONATE 0.12 % MT SOLN
OROMUCOSAL | Status: AC
  Administered 2022-07-23: 15 mL via OROMUCOSAL
  Filled 2022-07-23: qty 15

## 2022-07-23 MED ORDER — INSULIN DETEMIR 100 UNIT/ML ~~LOC~~ SOLN
20.0000 [IU] | Freq: Two times a day (BID) | SUBCUTANEOUS | Status: DC
  Administered 2022-07-23: 20 [IU] via SUBCUTANEOUS
  Filled 2022-07-23 (×3): qty 0.2

## 2022-07-23 MED ORDER — FENTANYL CITRATE (PF) 250 MCG/5ML IJ SOLN
INTRAMUSCULAR | Status: AC
  Filled 2022-07-23: qty 5

## 2022-07-23 MED ORDER — VASHE WOUND IRRIGATION OPTIME
TOPICAL | Status: DC | PRN
  Administered 2022-07-23: 34 [oz_av]

## 2022-07-23 SURGICAL SUPPLY — 73 items
APL SKNCLS STERI-STRIP NONHPOA (GAUZE/BANDAGES/DRESSINGS) ×1
ATTRACTOMAT 16X20 MAGNETIC DRP (DRAPES) ×1 IMPLANT
BAG DECANTER FOR FLEXI CONT (MISCELLANEOUS) ×1 IMPLANT
BENZOIN TINCTURE PRP APPL 2/3 (GAUZE/BANDAGES/DRESSINGS) IMPLANT
BLADE CLIPPER SURG (BLADE) ×1 IMPLANT
BLADE SURG 10 STRL SS (BLADE) ×1 IMPLANT
BLADE SURG 15 STRL LF DISP TIS (BLADE) IMPLANT
BLADE SURG 15 STRL SS (BLADE)
BNDG GAUZE DERMACEA FLUFF 4 (GAUZE/BANDAGES/DRESSINGS) IMPLANT
BNDG GZE DERMACEA 4 6PLY (GAUZE/BANDAGES/DRESSINGS)
CANISTER SUCT 3000ML PPV (MISCELLANEOUS) ×1 IMPLANT
CANISTER WOUND CARE 500ML ATS (WOUND CARE) ×1 IMPLANT
CANISTER WOUNDNEG PRESSURE 500 (CANNISTER) IMPLANT
CATH FOLEY 2WAY SLVR 5CC 16FR (CATHETERS) IMPLANT
CATH THORACIC 28FR RT ANG (CATHETERS) IMPLANT
CATH THORACIC 36FR (CATHETERS) IMPLANT
CLEANSER WND VASHE INSTL 34OZ (WOUND CARE) IMPLANT
CLIP TI WIDE RED SMALL 24 (CLIP) IMPLANT
CNTNR URN SCR LID CUP LEK RST (MISCELLANEOUS) IMPLANT
CONN Y 3/8X3/8X3/8 BEN (MISCELLANEOUS) IMPLANT
CONT SPEC 4OZ STRL OR WHT (MISCELLANEOUS)
CONTAINER PROTECT SURGISLUSH (MISCELLANEOUS) ×2 IMPLANT
COVER SURGICAL LIGHT HANDLE (MISCELLANEOUS) ×2 IMPLANT
DRAPE HALF SHEET 40X57 (DRAPES) IMPLANT
DRAPE INCISE IOBAN 66X45 STRL (DRAPES) IMPLANT
DRAPE LAPAROSCOPIC ABDOMINAL (DRAPES) ×1 IMPLANT
DRAPE SLUSH/WARMER DISC (DRAPES) IMPLANT
DRAPE WARM FLUID 44X44 (DRAPES) IMPLANT
DRSG AQUACEL AG ADV 3.5X14 (GAUZE/BANDAGES/DRESSINGS) ×1 IMPLANT
DRSG VAC GRANUFOAM LG (GAUZE/BANDAGES/DRESSINGS) ×1 IMPLANT
DRSG VAC GRANUFOAM MED (GAUZE/BANDAGES/DRESSINGS) ×1 IMPLANT
DRSG VAC GRANUFOAM SM (GAUZE/BANDAGES/DRESSINGS) ×1 IMPLANT
ELECT BLADE 4.0 EZ CLEAN MEGAD (MISCELLANEOUS) ×1
ELECT REM PT RETURN 9FT ADLT (ELECTROSURGICAL)
ELECTRODE BLDE 4.0 EZ CLN MEGD (MISCELLANEOUS) IMPLANT
ELECTRODE REM PT RTRN 9FT ADLT (ELECTROSURGICAL) ×1 IMPLANT
GAUZE 4X4 16PLY ~~LOC~~+RFID DBL (SPONGE) ×1 IMPLANT
GAUZE PAD ABD 8X10 STRL (GAUZE/BANDAGES/DRESSINGS) IMPLANT
GAUZE SPONGE 4X4 12PLY STRL (GAUZE/BANDAGES/DRESSINGS) ×1 IMPLANT
GAUZE XEROFORM 5X9 LF (GAUZE/BANDAGES/DRESSINGS) IMPLANT
GLOVE BIO SURGEON STRL SZ7.5 (GLOVE) ×2 IMPLANT
GOWN STRL REUS W/ TWL LRG LVL3 (GOWN DISPOSABLE) ×4 IMPLANT
GOWN STRL REUS W/TWL LRG LVL3 (GOWN DISPOSABLE) ×2
HANDPIECE INTERPULSE COAX TIP (DISPOSABLE) ×1
HEMOSTAT POWDER SURGIFOAM 1G (HEMOSTASIS) IMPLANT
HEMOSTAT SURGICEL 2X14 (HEMOSTASIS) IMPLANT
KIT BASIN OR (CUSTOM PROCEDURE TRAY) ×1 IMPLANT
KIT SUCTION CATH 14FR (SUCTIONS) IMPLANT
KIT TURNOVER KIT B (KITS) ×1 IMPLANT
NS IRRIG 1000ML POUR BTL (IV SOLUTION) ×1 IMPLANT
PACK CHEST (CUSTOM PROCEDURE TRAY) ×1 IMPLANT
PACK GENERAL/GYN (CUSTOM PROCEDURE TRAY) ×1 IMPLANT
PAD ARMBOARD 7.5X6 YLW CONV (MISCELLANEOUS) ×2 IMPLANT
SET HNDPC FAN SPRY TIP SCT (DISPOSABLE) ×1 IMPLANT
SOL PREP POV-IOD 4OZ 10% (MISCELLANEOUS) IMPLANT
SPONGE T-LAP 18X18 ~~LOC~~+RFID (SPONGE) ×5 IMPLANT
SPONGE T-LAP 4X18 ~~LOC~~+RFID (SPONGE) ×1 IMPLANT
STAPLER VISISTAT 35W (STAPLE) IMPLANT
SUT ETHILON 3 0 FSL (SUTURE) IMPLANT
SUT STEEL 6MS V (SUTURE) IMPLANT
SUT STEEL STERNAL CCS#1 18IN (SUTURE) IMPLANT
SUT STEEL SZ 6 DBL 3X14 BALL (SUTURE) IMPLANT
SUT VIC AB 1 CTX 36 (SUTURE)
SUT VIC AB 1 CTX36XBRD ANBCTR (SUTURE) ×2 IMPLANT
SUT VIC AB 2-0 CTX 27 (SUTURE) ×2 IMPLANT
SUT VIC AB 3-0 X1 27 (SUTURE) ×2 IMPLANT
SWAB COLLECTION DEVICE MRSA (MISCELLANEOUS) IMPLANT
SWAB CULTURE ESWAB REG 1ML (MISCELLANEOUS) IMPLANT
SYR 5ML LL (SYRINGE) IMPLANT
TOWEL GREEN STERILE (TOWEL DISPOSABLE) ×1 IMPLANT
TOWEL GREEN STERILE FF (TOWEL DISPOSABLE) ×1 IMPLANT
TRAY FOLEY MTR SLVR 16FR STAT (SET/KITS/TRAYS/PACK) IMPLANT
WATER STERILE IRR 1000ML POUR (IV SOLUTION) ×1 IMPLANT

## 2022-07-23 NOTE — Anesthesia Procedure Notes (Signed)
Procedure Name: Intubation Date/Time: 07/23/2022 12:19 PM  Performed by: De Nurse, CRNAPre-anesthesia Checklist: Patient identified, Emergency Drugs available, Suction available and Patient being monitored Patient Re-evaluated:Patient Re-evaluated prior to induction Oxygen Delivery Method: Circle System Utilized Preoxygenation: Pre-oxygenation with 100% oxygen Induction Type: IV induction Ventilation: Mask ventilation without difficulty Laryngoscope Size: Mac and 4 Grade View: Grade I Tube type: Oral Tube size: 7.5 mm Number of attempts: 1 Airway Equipment and Method: Stylet and Oral airway Placement Confirmation: ETT inserted through vocal cords under direct vision, positive ETCO2 and breath sounds checked- equal and bilateral Secured at: 23 cm Tube secured with: Tape Dental Injury: Teeth and Oropharynx as per pre-operative assessment

## 2022-07-23 NOTE — Plan of Care (Signed)

## 2022-07-23 NOTE — Brief Op Note (Signed)
07/23/2022  1:29 PM  PATIENT:  George [name]  77 y.o. male  PRE-OPERATIVE DIAGNOSIS:  [place] JOINT ABSCESS  POST-OPERATIVE DIAGNOSIS:  [place] JOINT ABSCESS  PROCEDURE:  Procedure(s): LEFT STERNOCLAVICULAR JOINT DEBRIDEMENT (Left) APPLICATION OF WOUND VAC (Left) Excisional debridement of bone, fascia, subcutaneous fat SURGEON:  Surgeon(s) and Role:    Lovett Sox, MD - Primary  PHYSICIAN ASSISTANT: na  ASSISTANTS: RNFA   ANESTHESIA:   general  EBL:  20 mL   BLOOD ADMINISTERED:none  DRAINS: none   LOCAL MEDICATIONS USED:  NONE  SPECIMEN:  Excision  DISPOSITION OF SPECIMEN:   microbiology  COUNTS:  YES  TOURNIQUET:  * No tourniquets in log *  DICTATION: .Dragon Dictation  PLAN OF CARE:  transfer to 2C, 4E  PATIENT DISPOSITION:  to PACU stable condition   Delay start of Pharmacological VTE agent (>24hrs) due to surgical blood loss or risk of bleeding: yes

## 2022-07-23 NOTE — Anesthesia Postprocedure Evaluation (Signed)
Anesthesia Post Note  Patient: George [name]  Procedure(s) Performed: LEFT STERNOCLAVICULAR JOINT DEBRIDEMENT (Left) APPLICATION OF WOUND VAC (Left)     Patient location during evaluation: PACU Anesthesia Type: General Level of consciousness: awake and alert Pain management: pain level controlled Vital Signs Assessment: post-procedure vital signs reviewed and stable Respiratory status: spontaneous breathing, nonlabored ventilation, respiratory function stable and patient connected to nasal cannula oxygen Cardiovascular status: blood pressure returned to baseline and stable Postop Assessment: no apparent nausea or vomiting Anesthetic complications: no  No notable events documented.  Last Vitals:  Vitals:   07/23/22 1345 07/23/22 1348  BP: (!) 155/74   Pulse: 80 84  Resp: 15 14  Temp:    SpO2: 95% 96%    Last Pain:  Vitals:   07/23/22 1109  TempSrc:   PainSc: 0-No pain                 Kaedynce Tapp S

## 2022-07-23 NOTE — Patient Instructions (Signed)
Visit Information  Thank you for taking time to visit with me today. Please don't hesitate to contact me if I can be of assistance to you.   Following are the goals we discussed today:   Goals Addressed               This Visit's Progress     Assess Need for Social Work Involvement. (pt-stated)   On track     Care Coordination Interventions:  Interventions Today    Flowsheet Row Most Recent Value  Chronic Disease   Chronic disease during today's visit Diabetes, Hypertension (HTN), Other  [Coronary Artery Disease & Non-ST Elevated Myocardial Infarction]  General Interventions   General Interventions Discussed/Reviewed General Interventions Discussed, Labs, Vaccines, Doctor Visits, Referral to Nurse, Health Screening, Annual Foot Exam, General Interventions Reviewed, Annual Eye Exam, Lipid Profile, Durable Medical Equipment (DME), Community Resources, Communication with, Level of Care  [Encouraged]  Labs Hgb A1c every 3 months, Kidney Function  [Encouraged]  Vaccines COVID-19, Flu, Pneumonia, RSV, Shingles, Tetanus/Pertussis/Diphtheria  [Encouraged]  Doctor Visits Discussed/Reviewed Doctor Visits Discussed, Specialist, Doctor Visits Reviewed, Annual Wellness Visits, PCP  [Encouraged]  Health Screening Bone Density, Colonoscopy, Prostate  [Encouraged]  Durable Medical Equipment (DME) Bed side commode, Glucomoter, Other  [Eye Glasses]  PCP/Specialist Visits Compliance with follow-up visit  [Encouraged]  Communication with PCP/Specialists, RN  [Encouraged]  Level of Care Adult Daycare, Personal Care Services, Applications, Assisted Living  [Encouraged]  Applications Medicaid, Personal Care Services, [place]-2  [Encouraged]  Exercise Interventions   Exercise Discussed/Reviewed Exercise Discussed, Assistive device use and maintanence, Exercise Reviewed, Physical Activity, Weight Managment  [Encouraged]  Physical Activity Discussed/Reviewed Physical Activity Discussed, Home Exercise Program  (HEP), Physical Activity Reviewed, Types of exercise  [Encouraged]  Weight Management Weight loss  [Encouraged]  Education Interventions   Education Provided Provided Therapist, sports, Provided Web-based Education, Provided Education  [Encouraged]  Provided Verbal Education On Nutrition, Mental Health/Coping with Illness, When to see the doctor, Foot Care, Eye Care, Labs, Blood Sugar Monitoring, Applications, Exercise, Medication, Development worker, community, [employer] Resources  [Encouraged]  Applications Medicaid, Personal Care Services, [place]-2  [Encouraged]  Mental Health Interventions   Mental Health Discussed/Reviewed Mental Health Discussed, Anxiety, Depression, Grief and Loss, Mental Health Reviewed, Substance Abuse, Coping Strategies, Crisis, Other, Suicide  [Domestic Violence]  Nutrition Interventions   Nutrition Discussed/Reviewed Nutrition Discussed, Adding fruits and vegetables, Increasing proteins, Nutrition Reviewed, Decreasing fats, Decreasing salt, Fluid intake, Carbohydrate meal planning, Portion sizes, Decreasing sugar intake  [Encouraged]  Pharmacy Interventions   Pharmacy Dicussed/Reviewed Pharmacy Topics Discussed, Pharmacy Topics Reviewed, Medication Adherence, Affording Medications  [Encouraged]  Safety Interventions   Safety Discussed/Reviewed Safety Discussed, Safety Reviewed, Fall Risk, Home Safety  [Encouraged]  Home Safety Assistive Devices, Contact provider for referral to PT/OT, Need for home safety assessment, Refer for home visit, Refer for community resources, Contact home health agency  [Encouraged]  Advanced Directive Interventions   Advanced Directives Discussed/Reviewed Advanced Directives Discussed  [Encouraged]     Active Listening & Reflection Utilized.  Verbalization of Feelings Encouraged.  Emotional Support Provided. Feelings of Sadness Regarding Declining Health Validated. Symptoms of Anxiety & Stress Acknowledged. Caregiver Resources Revisited. Caregiver  Support Groups Reviewed. Self-Enrollment in Caregiver Support Group of Interest Emphasized. Problem Solving Interventions Implemented. Task-Centered Solutions Indicated.   Solution-Focused Strategies Activated. Acceptance & Commitment Therapy Performed. Cognitive Behavioral Therapy Initiated. Client-Centered Therapy Employed. CSW Collaboration with Wife, Yohanes Kobs to Confirm Patient's Emergency Department Visit at [facility] (# [phone]), on 07/22/2022, for Excisional Debridement  of Left Sternoclavicular Joint Abscess. CSW Collaboration with Wife, Tyre Coady to Confirm Interest in Patient Returning Home to Live with Her, Upon Discharge from [facility] (# [phone]). CSW Collaboration with Wife, Jahkari Manis to Confirm Intentions to Resume Home Health Skilled Nursing Services for IV Antibioitic Administration of Cefazolin 2 MG, PO, Every 8 Hours, through [facility] (# [phone]). CSW Collaboration with Wife, Devarion Tornow to Confirm Intentions to Resume Home Health Physical & Occupational Therapy Services, through [facility] [phone]). CSW Collaboration with Wife, Reinhardt Hillis to Continue Review & Control and instrumentation engineer with [employer], Services, & Resources of Interest, in An Effort to Obtain Care & Supervision for Patient in The Home & Community. CSW Collaboration with Wife, Franky Bucco to [employer] with CSW [phone]# [phone]), if She Has Questions, Needs Assistance, or If Additional Social Work Needs Are Identified Between Now & Our Next Scheduled Follow-Up Outreach Call.      Our next appointment is by telephone on 08/05/2022 at 11:30 am.  Please call the care guide team at [phone] if you need to cancel or reschedule your appointment.   If you are experiencing a Mental Health or Behavioral Health Crisis or need someone to talk to, please call the Suicide and Crisis Lifeline: 988 call the [place] National  Suicide Prevention Lifeline: (520)848-5188 or TTY: 743-817-5999 TTY 301-163-4531) to talk to a trained counselor call 1-800-273-TALK (toll free, 24 hour hotline) go to [facility] [address], [place] [phone]) call the [facility] Crisis Line: 726 096 1826 call 911  Patient verbalizes understanding of instructions and care plan provided today and agrees to view in MyChart. Active MyChart status and patient understanding of how to access instructions and care plan via MyChart confirmed with patient.     Telephone follow up appointment with care management team member scheduled for:  08/05/2022 at 11:30 am.   [name], BSW, MSW, LCSW  Licensed Clinical Social Worker  [facility]  Mailing [place]. [address], [place], [place] [address] Physical Address-[address] [place], [place] [address] Toll Free Main # [phone] Fax # [phone] Cell # [phone] [name]

## 2022-07-23 NOTE — Patient Outreach (Signed)
Care Coordination   Follow Up Visit Note   07/23/2022  Name: ARAGON GULDEN MRN: [id number] DOB: [birth year]  George [name] is a 77 y.o. year old male who sees Kirstie Peri, MD for primary care. I spoke with patient's wife, Joanne Nosbisch by phone today.  What matters to the patients health and wellness today?  Assess Need for Social Work Involvement.   Goals Addressed               This Visit's Progress     Assess Need for Social Work Involvement. (pt-stated)   On track     Care Coordination Interventions:  Interventions Today    Flowsheet Row Most Recent Value  Chronic Disease   Chronic disease during today's visit Diabetes, Hypertension (HTN), Other  [Coronary Artery Disease & Non-ST Elevated Myocardial Infarction]  General Interventions   General Interventions Discussed/Reviewed General Interventions Discussed, Labs, Vaccines, Doctor Visits, Referral to Nurse, Health Screening, Annual Foot Exam, General Interventions Reviewed, Annual Eye Exam, Lipid Profile, Durable Medical Equipment (DME), Community Resources, Communication with, Level of Care  [Encouraged]  Labs Hgb A1c every 3 months, Kidney Function  [Encouraged]  Vaccines COVID-19, Flu, Pneumonia, RSV, Shingles, Tetanus/Pertussis/Diphtheria  [Encouraged]  Doctor Visits Discussed/Reviewed Doctor Visits Discussed, Specialist, Doctor Visits Reviewed, Annual Wellness Visits, PCP  [Encouraged]  Health Screening Bone Density, Colonoscopy, Prostate  [Encouraged]  Durable Medical Equipment (DME) Bed side commode, Glucomoter, Other  [Eye Glasses]  PCP/Specialist Visits Compliance with follow-up visit  [Encouraged]  Communication with PCP/Specialists, RN  [Encouraged]  Level of Care Adult Daycare, Personal Care Services, Applications, Assisted Living  [Encouraged]  Applications Medicaid, Personal Care Services, [place]-2  [Encouraged]  Exercise Interventions   Exercise Discussed/Reviewed Exercise Discussed, Assistive device use and  maintanence, Exercise Reviewed, Physical Activity, Weight Managment  [Encouraged]  Physical Activity Discussed/Reviewed Physical Activity Discussed, Home Exercise Program (HEP), Physical Activity Reviewed, Types of exercise  [Encouraged]  Weight Management Weight loss  [Encouraged]  Education Interventions   Education Provided Provided Therapist, sports, Provided Web-based Education, Provided Education  [Encouraged]  Provided Verbal Education On Nutrition, Mental Health/Coping with Illness, When to see the doctor, Foot Care, Eye Care, Labs, Blood Sugar Monitoring, Applications, Exercise, Medication, Development worker, community, [employer] Resources  [Encouraged]  Applications Medicaid, Personal Care Services, [place]-2  [Encouraged]  Mental Health Interventions   Mental Health Discussed/Reviewed Mental Health Discussed, Anxiety, Depression, Grief and Loss, Mental Health Reviewed, Substance Abuse, Coping Strategies, Crisis, Other, Suicide  [Domestic Violence]  Nutrition Interventions   Nutrition Discussed/Reviewed Nutrition Discussed, Adding fruits and vegetables, Increasing proteins, Nutrition Reviewed, Decreasing fats, Decreasing salt, Fluid intake, Carbohydrate meal planning, Portion sizes, Decreasing sugar intake  [Encouraged]  Pharmacy Interventions   Pharmacy Dicussed/Reviewed Pharmacy Topics Discussed, Pharmacy Topics Reviewed, Medication Adherence, Affording Medications  [Encouraged]  Safety Interventions   Safety Discussed/Reviewed Safety Discussed, Safety Reviewed, Fall Risk, Home Safety  [Encouraged]  Home Safety Assistive Devices, Contact provider for referral to PT/OT, Need for home safety assessment, Refer for home visit, Refer for community resources, Contact home health agency  [Encouraged]  Advanced Directive Interventions   Advanced Directives Discussed/Reviewed Advanced Directives Discussed  [Encouraged]     Active Listening & Reflection Utilized.  Verbalization of Feelings Encouraged.   Emotional Support Provided. Feelings of Sadness Regarding Declining Health Validated. Symptoms of Anxiety & Stress Acknowledged. Caregiver Resources Revisited. Caregiver Support Groups Reviewed. Self-Enrollment in Caregiver Support Group of Interest Emphasized. Problem Solving Interventions Implemented. Task-Centered Solutions Indicated.   Solution-Focused Strategies Activated. Acceptance & Commitment Therapy Performed.  Cognitive Behavioral Therapy Initiated. Client-Centered Therapy Employed. CSW Collaboration with Wife, Yan Richardson to Confirm Patient's Emergency Department Visit at [facility] (# [phone]), on 07/22/2022, for Excisional Debridement of Left Sternoclavicular Joint Abscess. CSW Collaboration with Wife, Stiles Alpers to Confirm Interest in Patient Returning Home to Live with Her, Upon Discharge from [facility] (# [phone]). CSW Collaboration with Wife, Donevan Clyne to Confirm Intentions to Resume Home Health Skilled Nursing Services for IV Antibioitic Administration of Cefazolin 2 MG, PO, Every 8 Hours, through [facility] (# [phone]). CSW Collaboration with Wife, Malique Debellis to Confirm Intentions to Resume Home Health Physical & Occupational Therapy Services, through [facility] [phone]). CSW Collaboration with Wife, Jaruis Casselberry to Continue Review & Control and instrumentation engineer with [employer], Services, & Resources of Interest, in An Effort to Obtain Care & Supervision for Patient in The Home & Community. CSW Collaboration with Wife, Wrenley Weatherman to [employer] with CSW [phone]# [phone]), if She Has Questions, Needs Assistance, or If Additional Social Work Needs Are Identified Between Now & Our Next Scheduled Follow-Up Outreach Call.      SDOH assessments and interventions completed:  Yes.  Care Coordination Interventions:  Yes, provided.   Follow up plan: Follow up call scheduled for 08/05/2022 at  11:30 am.  Encounter Outcome:  Pt. Visit Completed.   [name], BSW, MSW, LCSW  Licensed Restaurant manager, [facility]  Mailing [place] [address] [place], [place] [address] Physical Address-[address] [place], [place] [address] Toll Free Main # [phone] Fax # [phone] Cell # [phone] [name]

## 2022-07-23 NOTE — Progress Notes (Signed)
Pharmacy Antibiotic Note  George [name] is a 77 y.o. male admitted on 07/22/2022 with  Left sternoclavicular joint infection with MSSA nonresponsive to extended IV antibiotics through PICC line (Ancef).  Plan for surgical debridement, wound VAC therapy and abx will be switched to vancomycin . Repeat cultures post surgery.  Pharmacy has been consulted for Vancomycin dosing. Cr stable, debridement today.  Plan: Continue vancomycin 1000mg  IV q12h   Height: 6\' 5"  (195.6 cm) Weight: 94.8 kg (208 lb 15.9 oz) IBW/kg (Calculated) : 89.1  Temp (24hrs), Avg:98 F (36.7 C), Min:97.6 F (36.4 C), Max:98.3 F (36.8 C)  Recent Labs  Lab 07/22/22 1436 07/22/22 1515 07/23/22 0445  WBC 4.5  --  4.1  CREATININE  --  0.66 0.57*     Estimated Creatinine Clearance: 97.5 mL/min (A) (by C-G formula based on SCr of 0.57 mg/dL (L)).    No Known Allergies  Antimicrobials this admission: PTA Ancef >>new plan stop date 7/17 per ID clinic note 7/1 (original stop date 7/5 for total of 6 wks) 7/8 Vanc >>   Microbiology results: 7/8 BCx:   Thank you for allowing pharmacy to be a part of this patient's care.  Fredonia [place], PharmD, BCPS, [facility] Clinical Pharmacist [phone] Please check AMION for all [facility] Pharmacy numbers 07/23/2022

## 2022-07-23 NOTE — Anesthesia Preprocedure Evaluation (Signed)
Anesthesia Evaluation    Airway Mallampati: II  TM Distance: >3 FB Neck ROM: Full    Dental no notable dental hx.    Pulmonary former smoker   Pulmonary exam normal breath sounds clear to auscultation       Cardiovascular hypertension, + CAD, + Past MI, + CABG, + Peripheral Vascular Disease and +CHF  + Valvular Problems/Murmurs AI  Rhythm:Regular Rate:Normal + Systolic murmurs Left Ventricle: Left ventricular ejection fraction, by estimation, is 45  to 50%. The left ventricle has mildly decreased function. The left  ventricle demonstrates global hypokinesis. The left ventricular internal  cavity size was normal in size. There is   no left ventricular hypertrophy.   Right Ventricle: The right ventricular size is normal. No increase in  right ventricular wall thickness. Right ventricular systolic function is  normal.   Left Atrium: Left atrial size was normal in size. No left atrial/left  atrial appendage thrombus was detected.   Right Atrium: Right atrial size was normal in size.   Pericardium: There is no evidence of pericardial effusion.   Mitral Valve: The mitral valve is normal in structure. Mild mitral valve  regurgitation. No evidence of mitral valve stenosis.   Tricuspid Valve: The tricuspid valve is normal in structure. Tricuspid  valve regurgitation is not demonstrated. No evidence of tricuspid  stenosis.   Aortic Valve: Eccentric anteriorly directed aortic regurgitation. The  aortic valve is tricuspid. Aortic valve regurgitation is mild to moderate.  No aortic stenosis is present.     Neuro/Psych    GI/Hepatic   Endo/Other  diabetes, Poorly Controlled, Type 2    Renal/GU      Musculoskeletal   Abdominal   Peds  Hematology  (+) Blood dyscrasia, anemia   Anesthesia Other Findings   Reproductive/Obstetrics                             Anesthesia Physical Anesthesia  Plan  ASA: 3  Anesthesia Plan: General   Post-op Pain Management: Tylenol PO (pre-op)*   Induction: Intravenous  PONV Risk Score and Plan: 2 and Ondansetron, Dexamethasone and Treatment may vary due to age or medical condition  Airway Management Planned: Oral ETT  Additional Equipment:   Intra-op Plan:   Post-operative Plan: Extubation in OR  Informed Consent: I have reviewed the patients History and Physical, chart, labs and discussed the procedure including the risks, benefits and alternatives for the proposed anesthesia with the patient or authorized representative who has indicated his/her understanding and acceptance.     Dental advisory given  Plan Discussed with: CRNA and Surgeon  Anesthesia Plan Comments:        Anesthesia Quick Evaluation

## 2022-07-23 NOTE — Transfer of Care (Signed)
Immediate Anesthesia Transfer of Care Note  Patient: George [name]  Procedure(s) Performed: LEFT STERNOCLAVICULAR JOINT DEBRIDEMENT (Left) APPLICATION OF WOUND VAC (Left)  Patient Location: PACU  Anesthesia Type:General  Level of Consciousness: awake  Airway & Oxygen Therapy: Patient Spontanous Breathing  Post-op Assessment: Report given to RN  Post vital signs: Reviewed and stable  Last Vitals:  Vitals Value Taken Time  BP 167/80 07/23/22 1318  Temp    Pulse 86 07/23/22 1319  Resp 14 07/23/22 1319  SpO2 91 % 07/23/22 1319  Vitals shown include unvalidated device data.  Last Pain:  Vitals:   07/23/22 1109  TempSrc:   PainSc: 0-No pain      Patients Stated Pain Goal: 2 (07/23/22 0800)  Complications: No notable events documented.

## 2022-07-23 NOTE — Op Note (Unsigned)
[name], [name] MEDICAL RECORD NO: [id number] ACCOUNT NO: [id number] DATE OF BIRTH: 01-Aug-1945 FACILITY: [facility] LOCATION: [id number] PHYSICIAN: Kerin Perna III, MD  Operative Report   DATE OF PROCEDURE: 07/23/2022  OPERATION: 1.  Excisional debridement of left sternoclavicular joint abscess with excisional debridement of subcutaneous fat, fascia, and bone. 2.  Pulse lavage irrigation with Vashe wound solution. 3.  Placement of wound VAC system.  SURGEON:  Kerin Perna, M.D.  ANESTHESIA:  General.  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  DESCRIPTION OF PROCEDURE:  The patient was brought to preoperative holding where informed consent was documented and after I discussed the procedure in detail with the patient and his wife including the benefits, risks and alternatives and the plan to  use a wound VAC system postoperatively.  The patient was brought to the OR by the anesthesia team and placed supine on the operating table.  He was connected to the monitoring systems and general anesthesia was induced and he was intubated.  He remained stable.  The patient was positioned and  the chest and neck were prepped and draped as a sterile field.  A proper timeout was performed.  An incision was made transversely across the chest wall deformity with erythema and fluctuance.  It was extended across the area of the left clavicular head.  Purulent material was immediately expressed and the wound was cultured.  The incision was  excised.  The chronically indurated and infected subcutaneous tissue and fascia were excised to expose the deeper superior chest structures.  There was missing bone on the clavicular  head and also some infected quality tissue of the left side of the  manubrium.  Bone rongeurs were used to freshen up the end of the clavicle at the site of the infection as well as using bone rongeurs to remove infected bone at the left side of the manubrium.  Hemostasis was achieved with electrocautery.  A liter of  Vashe wound irrigation was then used with a pulse lavage device.  Hemostasis was rechecked and then a small wound VAC was cut to the appropriate configuration to fill the space, which measured 3 cm deep and a 5 cm wide.  The patient was then connected to  the wound VAC suction system.  Anesthesia was reversed and he was taken to the recovery room in stable condition.   SHY D: 07/23/2022 1:51:23 pm T: 07/23/2022 2:18:00 pm  JOB: [id number]/ [id number]

## 2022-07-23 NOTE — Progress Notes (Signed)
Pre Procedure note for inpatients:   George [name] has been scheduled for Procedure(s): LEFT STERNOCLAVICULAR JOINT DEBRIDEMENT (Left) APPLICATION OF WOUND VAC (Left) today. The various methods of treatment have been discussed with the patient. After consideration of the risks, benefits and treatment options the patient has consented to the planned procedure.   The patient has been seen and labs reviewed. There are no changes in the patient's condition to prevent proceeding with the planned procedure today.  Recent labs:  Lab Results  Component Value Date   WBC 4.1 07/23/2022   HGB 10.0 (L) 07/23/2022   HCT 30.8 (L) 07/23/2022   PLT 239 07/23/2022   GLUCOSE 239 (H) 07/23/2022   CHOL 144 06/06/2022   TRIG 285 (H) 06/06/2022   HDL 37 (L) 06/06/2022   LDLCALC 50 06/06/2022   ALT 11 07/23/2022   AST 13 (L) 07/23/2022   NA 134 (L) 07/23/2022   K 3.7 07/23/2022   CL 97 (L) 07/23/2022   CREATININE 0.57 (L) 07/23/2022   BUN 10 07/23/2022   CO2 28 07/23/2022   TSH 2.92 04/23/2017   INR 1.1 07/22/2022   HGBA1C 8.6 (H) 06/06/2022    Lovett Sox, MD 07/23/2022 9:41 AM

## 2022-07-23 NOTE — TOC CM/SW Note (Signed)
Transition of Care [facility]) - Inpatient Brief Assessment   Patient Details  Name: George [name] MRN: [id number] Date of Birth: [birth year]  Transition of Care [facility]) CM/SW Contact:    Gala Lewandowsky, RN Phone Number: 07/23/2022, 3:41 PM   Clinical Narrative: Patient presented for surgery for excisional debridement of left sternoclavicular joint abscess. PTA patient was from home with family support. Patient is currently active with [facility] for IV antibiotics cefazolin 2 gm every 8 hours. Patient has Restaurant manager, [employer] for Nursing. Case Manager will continue to follow for additional transition of care needs as the patient progresses.      Transition of Care Asessment: Insurance and Status: Insurance coverage has been reviewed Patient has primary care physician: Yes Prior/Current Home Services: No current home services Social Determinants of Health Reivew: SDOH reviewed no interventions necessary Readmission risk has been reviewed: Yes Transition of care needs: transition of care needs identified, TOC will continue to follow

## 2022-07-24 ENCOUNTER — Encounter (HOSPITAL_COMMUNITY): Payer: Self-pay | Admitting: Cardiothoracic Surgery

## 2022-07-24 ENCOUNTER — Inpatient Hospital Stay (HOSPITAL_COMMUNITY): Payer: Medicare Other

## 2022-07-24 LAB — BASIC METABOLIC PANEL
Anion gap: 7 (ref 5–15)
BUN: 8 mg/dL (ref 8–23)
CO2: 29 mmol/L (ref 22–32)
Calcium: 8.7 mg/dL — ABNORMAL LOW (ref 8.9–10.3)
Chloride: 98 mmol/L (ref 98–111)
Creatinine, Ser: 0.69 mg/dL (ref 0.61–1.24)
GFR, Estimated: >60 mL/min (ref >60)
Glucose, Bld: 219 mg/dL — ABNORMAL HIGH (ref 70–99)
Potassium: 3.8 mmol/L (ref 3.5–5.1)
Sodium: 134 mmol/L — ABNORMAL LOW (ref 135–145)

## 2022-07-24 LAB — TYPE AND SCREEN
ABO/RH(D): O POS
Antibody Screen: NEGATIVE
Unit division: 0

## 2022-07-24 LAB — CBC
HCT: 31 % — ABNORMAL LOW (ref 39.0–52.0)
Hemoglobin: 9.9 g/dL — ABNORMAL LOW (ref 13.0–17.0)
MCH: 26.1 pg (ref 26.0–34.0)
MCHC: 31.9 g/dL (ref 30.0–36.0)
MCV: 81.8 fL (ref 80.0–100.0)
Platelets: 224 10*3/uL (ref 150–400)
RBC: 3.79 MIL/uL — ABNORMAL LOW (ref 4.22–5.81)
RDW: 13.2 % (ref 11.5–15.5)
WBC: 4.4 10*3/uL (ref 4.0–10.5)
nRBC: 0 % (ref 0.0–0.2)

## 2022-07-24 LAB — GLUCOSE, CAPILLARY
Glucose-Capillary: 258 mg/dL — ABNORMAL HIGH (ref 70–99)
Glucose-Capillary: 241 mg/dL — ABNORMAL HIGH (ref 70–99)
Glucose-Capillary: 298 mg/dL — ABNORMAL HIGH (ref 70–99)
Glucose-Capillary: 258 mg/dL — ABNORMAL HIGH (ref 70–99)

## 2022-07-24 LAB — BPAM RBC
Blood Product Expiration Date: 202408072359
Blood Product Expiration Date: 202408072359
Unit Type and Rh: 5100

## 2022-07-24 MED ORDER — CARVEDILOL 6.25 MG PO TABS
6.2500 mg | ORAL_TABLET | Freq: Two times a day (BID) | ORAL | Status: DC
  Administered 2022-07-24 – 2022-07-26 (×6): 6.25 mg via ORAL
  Filled 2022-07-24 (×7): qty 1

## 2022-07-24 MED ORDER — TRAMADOL HCL 50 MG PO TABS
50.0000 mg | ORAL_TABLET | Freq: Four times a day (QID) | ORAL | Status: DC | PRN
  Administered 2022-07-28 – 2022-07-30 (×2): 50 mg via ORAL
  Filled 2022-07-24 (×2): qty 1

## 2022-07-24 MED ORDER — DIPHENHYDRAMINE HCL 25 MG PO CAPS
25.0000 mg | ORAL_CAPSULE | Freq: Once | ORAL | Status: AC
  Administered 2022-07-24: 25 mg via ORAL
  Filled 2022-07-24: qty 1

## 2022-07-24 MED ORDER — INSULIN DETEMIR 100 UNIT/ML ~~LOC~~ SOLN
26.0000 [IU] | Freq: Two times a day (BID) | SUBCUTANEOUS | Status: DC
  Administered 2022-07-24 (×2): 26 [IU] via SUBCUTANEOUS
  Filled 2022-07-24 (×4): qty 0.26

## 2022-07-24 MED ORDER — POTASSIUM CHLORIDE CRYS ER 20 MEQ PO TBCR
30.0000 meq | EXTENDED_RELEASE_TABLET | Freq: Once | ORAL | Status: AC
  Administered 2022-07-24: 30 meq via ORAL
  Filled 2022-07-24: qty 1

## 2022-07-24 MED ORDER — VANCOMYCIN HCL IN DEXTROSE 1-5 GM/200ML-% IV SOLN
1000.0000 mg | Freq: Two times a day (BID) | INTRAVENOUS | Status: DC
  Administered 2022-07-24 – 2022-07-25 (×2): 1000 mg via INTRAVENOUS
  Filled 2022-07-24 (×3): qty 200

## 2022-07-24 MED ORDER — LISINOPRIL 5 MG PO TABS
5.0000 mg | ORAL_TABLET | Freq: Every day | ORAL | Status: DC
  Administered 2022-07-24: 5 mg via ORAL
  Filled 2022-07-24: qty 1

## 2022-07-24 MED ORDER — FENTANYL CITRATE PF 50 MCG/ML IJ SOSY
25.0000 ug | PREFILLED_SYRINGE | Freq: Four times a day (QID) | INTRAMUSCULAR | Status: DC | PRN
  Administered 2022-07-26: 25 ug via INTRAVENOUS
  Filled 2022-07-24: qty 1

## 2022-07-24 MED ORDER — METOCLOPRAMIDE HCL 5 MG/ML IJ SOLN
10.0000 mg | Freq: Two times a day (BID) | INTRAMUSCULAR | Status: DC
  Administered 2022-07-24: 10 mg via INTRAVENOUS
  Filled 2022-07-24 (×2): qty 2

## 2022-07-24 NOTE — Discharge Summary (Incomplete)
[address]       [place] [address]             [phone]    Physician Discharge Summary  Patient ID: George [name] MRN: [id number] DOB/AGE: 06/30/1945 77 y.o.  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) 06/27/2022   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   MSSA bacteremia 06/06/2022   Sepsis due to methicillin susceptible Staphylococcus aureus (MSSA) without acute organ dysfunction (HCC) 06/06/2022   Acute pain of left shoulder 06/06/2022   NSTEMI (non-ST elevated myocardial infarction) (HCC) 06/05/2022   Malignant neoplasm of prostate (HCC) 05/01/2017   Uncontrolled type 2 diabetes mellitus with hyperglycemia (HCC) 02/26/2017   Mixed hyperlipidemia 02/26/2017   Essential hypertension, benign 02/26/2017   Thoracic ascending aortic aneurysm (HCC) 01/02/2017   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   CAD (coronary artery disease) of artery bypass graft 10/26/2012   Chest pain 08/05/2012   Abnormal stress test 08/05/2012   HTN (hypertension) 08/05/2012     Discharge Diagnoses:  Patient Active Problem List   Diagnosis Date Noted   Protein-calorie malnutrition, severe 07/26/2022   Pain of left sternoclavicular joint 07/22/2022   Localized swelling of chest wall 06/27/2022   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) 06/27/2022   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   MSSA bacteremia 06/06/2022   Sepsis due to methicillin susceptible Staphylococcus aureus (MSSA) without acute organ dysfunction (HCC) 06/06/2022   Acute pain of left shoulder 06/06/2022   NSTEMI (non-ST elevated myocardial infarction) (HCC) 06/05/2022   Malignant neoplasm of prostate (HCC) 05/01/2017    Uncontrolled type 2 diabetes mellitus with hyperglycemia (HCC) 02/26/2017   Mixed hyperlipidemia 02/26/2017   Essential hypertension, benign 02/26/2017   Thoracic ascending aortic aneurysm (HCC) 01/02/2017   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   CAD (coronary artery disease) of artery bypass graft 10/26/2012   Chest pain 08/05/2012   Abnormal stress test 08/05/2012   HTN (hypertension) 08/05/2012     Discharged Condition: Stable  HPI: Patient presents with pain and swelling over the left sternoclavicular joint and history of recent hospitalization.  He was admitted for the same symptoms Jun 10 2022.  He had MSSA bacteremia and a CT scan and MRI showed inflammatory changes in the left sternoclavicular joint with edema but no specific abscess and no evidence of cortical bone destruction.  An extended course of IV Ancef was recommended but the patient left AMA.  His home IV antibiotics were supervised by his primary care physician.  Blood cultures were negative in the hospital before he left AMA.   The patient is a diabetic with prior history of CABG x 3   10 years ago.   He now complains of severe left shoulder discomfort with poor range of motion due to pain and redness and swelling over the left sternoclavicular joint.   A follow-up CT scan was performed today.  I reviewed the images and discussed the findings with the patient and family.  He is now with a large abscess with cortical bone destruction involving the head of the left clavicle.  There is no extension into the mediastinum but there is soft tissue abscess beneath the skin measuring 3 cm. Dr. Donata Clay discussed the  need for admission in order to have an I and D of left SCV joint and continuation of IV antibiotic.  Hospital Course: He was started on Vancomycin. Patient underwent excisional debridement of left SCV joint abscess/fat/tissue/bone, pulse lavage irrigation with Vashe solution, and placement of wound vac on 07/09. He remains  afebrile. OR gram stain showed rare positive cocci. Wound culture from OR shows moderate Staph Aureus. Based on susceptibilities, Vancomycin was stopped and he was started on Cefazolin. Blood culture shows no growth for 5 days. Insulin was adjusted accordingly for better glucose control. His most recent HGA1C was 8.6. He will need close medical follow up after discharge. Inpatient diabetes coordinator was consulted. Glucose was better controlled with meal coverage and titrating scheduled Insulin. He returned to the OR on 07/12 for further debridement of left SCV and wound vac change. He again returned to the OR on 07/16 for washout of left sternoclavicular joint abscess with application of tissue matrix Myriad 500 grams powder and removal of wound VAC system. Per infectious disease note on last admission, Cefazolin last dose today 07/31/2022. As discussed with Dr. Donata Clay, will start Cefadroxil 500 mg bid today and continue for 3 weeks. PICC line will be removed today. Home health nursing will be arranged and because wife refuses to be taught how to change dressing , he will come to office tomorrow to have dressing change;wet to dry using VASHE solution and to be changed daily). Of note, he just received a prescription for Percocet 5/325 #28 on  07/06. He should not need a refill at this time as been hospitalized since 07/22/2022.  He is stable for discharge today.  Consults: None  Significant Diagnostic Studies:   Narrative & Impression  CLINICAL DATA:  PICC line placement.   EXAM: CHEST  1 VIEW   COMPARISON:  July 24, 2022   FINDINGS: Right-sided PICC line terminates at the expected location of the superior vena cava.   Stable postsurgical changes from CABG.   Cardiomediastinal silhouette is normal. Mediastinal contours appear intact. Tortuosity and calcific atherosclerotic disease of the aorta.   There is no evidence of focal airspace consolidation, pleural effusion or pneumothorax.    Osseous structures are without acute abnormality. Soft tissues are grossly normal.   IMPRESSION: 1. Right-sided PICC line terminates at the expected location of the superior vena cava. 2. No active disease.     Electronically Signed   By: Ted Mcalpine M.D.   On: 07/29/2022 16:33    Narrative & Impression  CLINICAL DATA:  Chest pain.   EXAM: PORTABLE CHEST 1 VIEW   COMPARISON:  July 23, 2022.   FINDINGS: Stable cardiomegaly. Status post coronary artery bypass graft. Lungs are clear. Hazy opacity is seen over left sternoclavicular joint consistent with septic arthritis as noted on prior studies.   IMPRESSION: Hazy opacity over left sternoclavicular joint concerning for septic arthritis as noted on prior studies. Lungs are clear.     Electronically Signed   By: Lupita Raider M.D.   On: 07/24/2022 08:11   EXAM: CT CHEST WITHOUT CONTRAST   TECHNIQUE: Multidetector CT imaging of the chest was performed following the standard protocol without IV contrast.   RADIATION DOSE REDUCTION: This exam was performed according to the departmental dose-optimization program which includes automated exposure control, adjustment of the mA and/or kV according to patient size and/or use of iterative reconstruction technique.   COMPARISON:  CT 06/05/2022.  MRI 06/10/2022   FINDINGS: Cardiovascular: Mild cardiomegaly. No pericardial  effusion. Prior CABG. Mid ascending thoracic aorta measures 4.0 cm in diameter. Atherosclerotic calcifications of the aorta and coronary arteries. Central pulmonary vasculature within normal limits. Right-sided PICC line terminates within the SVC.   Mediastinum/Nodes: Mild fat stranding within the retrosternal space of the anterior mediastinum. No mediastinal fluid collection or abscess. No axillary, mediastinal, or hilar lymphadenopathy. Thyroid gland, trachea, and esophagus within normal limits.   Lungs/Pleura: Lungs are clear. No pleural  effusion or pneumothorax.   Upper Abdomen: Cholelithiasis.  No acute abnormality.   Musculoskeletal: Large left sternoclavicular joint effusion with progressive erosion/destruction of the distal left clavicle and adjacent manubrium (series 5, image 54). Findings have progressed compared to the previous MRI. Small fluid and air containing collection within the subcutaneous soft tissues of the chest wall near the midline measuring approximately 3.7 x 2.1 x 2.1 cm, compatible with abscess. The right sternoclavicular joint appears within normal limits. Prior median sternotomy. 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   IMPRESSION: 1. 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 2. Small fluid and air containing collection within the subcutaneous soft tissues of the chest wall near the midline measuring approximately 3.7 x 2.1 x 2.1 cm, compatible with abscess. 3. Mild inflammatory fat stranding within the retrosternal space of the anterior mediastinum. No mediastinal fluid collection or abscess. 4. Lungs are clear. 5. Cholelithiasis. 6. Aortic atherosclerosis (ICD10-I70.0).   These results will be called to the ordering clinician or representative by the Radiologist Assistant, and communication documented in the PACS or [employer].     Electronically Signed   By: Duanne Guess D.O.   On: 07/22/2022 11:38  Treatments: surgery:  1.  Excisional debridement of left sternoclavicular joint abscess with excisional debridement of subcutaneous fat, fascia, and bone. 2.  Pulse lavage irrigation with Vashe wound solution. 3.  Placement of wound VAC system by Dr. Donata Clay on 07/23/2022. 4. **  Discharge Exam: Blood pressure 126/63, pulse 100, temperature (!) 97.5 F (36.4 C), temperature source Oral, resp. rate 18, height  6\' 5"  (1.956 m), weight 95 kg, SpO2 98%. Cardiovascular: RRR Pulmonary: Clear to auscultation bilaterally Wound: Dressing intact;mostly dry   Discharge Medications:  Allergies as of 07/31/2022   No Known Allergies      Medication List     STOP taking these medications    ceFAZolin IVPB Commonly known as: ANCEF       TAKE these medications    acetaminophen 500 MG tablet Commonly known as: TYLENOL Take 1,000 mg by mouth every 6 (six) hours as needed for moderate pain.   amLODipine 10 MG tablet Commonly known as: NORVASC Take 10 mg by mouth daily.   atorvastatin 80 MG tablet Commonly known as: LIPITOR Take 1 tablet (80 mg total) by mouth daily.   carvedilol 12.5 MG tablet Commonly known as: COREG Take 1 tablet (12.5 mg total) by mouth 2 (two) times daily.   cefadroxil 500 MG capsule Commonly known as: DURICEF Take 1 capsule (500 mg total) by mouth 2 (two) times daily. Please take until finished   insulin glargine 100 UNIT/ML Solostar Pen Commonly known as: LANTUS Inject 70-75 Units into the skin at bedtime.   lisinopril 10 MG tablet Commonly known as: ZESTRIL Take 1 tablet (10 mg total) by mouth daily.   nitroGLYCERIN 0.4 MG SL tablet Commonly known as: NITROSTAT Place 1 tablet (0.4 mg total) under the tongue  every 5 (five) minutes x 3 doses as needed for chest pain (if no relief after 3rd dose, proceed to ED for evaluation or call 911).   NovoLOG FlexPen 100 UNIT/ML FlexPen Generic drug: insulin aspart INJECT 25-31 UNITS INTO THE SKIN THREE TIMES DAILY BEFORE MEALS What changed: See the new instructions.   oxyCODONE-acetaminophen 5-325 MG tablet Commonly known as: PERCOCET/ROXICET Take 1 tablet by mouth every 6 (six) hours as needed for severe pain.   PreserVision AREDS 2 Caps Take 1 capsule by mouth 2 (two) times daily.        Follow-up Information     Lovett Sox, MD Follow up.   Specialty: Cardiothoracic Surgery Why: Appointment time is  at 1:00 pm Contact information: [address] [employer] [address] [place] [address] [phone]         Kirstie Peri, MD. Call in 2 week(s).   Specialty: Internal Medicine Why: Please call for an appointment for 2 weeks regarding further diabetes management and surviellance of HGA1C (last 8.6) Contact information: [address]  [place] [address] [phone]         [facility] Follow up.   Specialty: Cardiothoracic Surgery Why: Appointment is for left SCV joint dressing change only. Appointment time is at 12:30 pm Contact information: [address] [place], [address] [place] [address] [phone]                Signed:  Elenore Rota 07/31/2022, 10:38 AM   patient examined and medical record reviewed,agree with above note. Lovett Sox 07/31/2022

## 2022-07-24 NOTE — Hospital Course (Addendum)
HPI: Patient presents with pain and swelling over the left sternoclavicular joint and history of recent hospitalization.  He was admitted for the same symptoms Jun 10 2022.  He had MSSA bacteremia and a CT scan and MRI showed inflammatory changes in the left sternoclavicular joint with edema but no specific abscess and no evidence of cortical bone destruction.  An extended course of IV Ancef was recommended but the patient left AMA.  His home IV antibiotics were supervised by his primary care physician.  Blood cultures were negative in the hospital before he left AMA.   The patient is a diabetic with prior history of CABG x 3   10 years ago.   He now complains of severe left shoulder discomfort with poor range of motion due to pain and redness and swelling over the left sternoclavicular joint.   A follow-up CT scan was performed today.  I reviewed the images and discussed the findings with the patient and family.  He is now with a large abscess with cortical bone destruction involving the head of the left clavicle.  There is no extension into the mediastinum but there is soft tissue abscess beneath the skin measuring 3 cm. Dr. Donata Clay discussed the need for admission in order to have an I and D of left SCV joint and continuation of IV antibiotic.  Hospital Course: He was started on Vancomycin. Patient underwent excisional debridement of left SCV joint abscess/fat/tissue/bone, pulse lavage irrigation with Vashe solution, and placement of wound vac on 07/09. He remains afebrile. OR gram stain showed rare positive cocci. Wound culture from OR shows moderate Staph Aureus. Based on susceptibilities, Vancomycin was stopped and he was started on Cefazolin. Blood culture shows no growth for 5 days. Insulin was adjusted accordingly for better glucose control. His most recent HGA1C was 8.6. He will need close medical follow up after discharge. Inpatient diabetes coordinator was consulted. Glucose was better  controlled with meal coverage and titrating scheduled Insulin. He returned to the OR on 07/12 for further debridement of left SCV and wound vac change. He again returned to the OR on 07/16 for washout of left sternoclavicular joint abscess with application of tissue matrix Myriad 500 grams powder and removal of wound VAC system. Per infectious disease note on last admission, Cefazolin last dose today 07/31/2022. As discussed with Dr. Donata Clay, will start Cefadroxil 500 mg bid today and continue for 3 weeks. PICC line will be removed today. Home health nursing will be arranged and because wife refuses to be taught how to change dressing , he will come to office tomorrow to have dressing change;wet to dry using VASHE solution and to be changed daily). Of note, he just received a prescription for Percocet 5/325 #28 on  07/06. He should not need a refill at this time as been hospitalized since 07/22/2022.  He is stable for discharge today.

## 2022-07-24 NOTE — Progress Notes (Signed)
Pts O2 sats noted to intermittently drop to mid 70's non sustained and back up to mid 90's while sleeping. O2 applied at 2L/[place], Pt denies history of OSA or use of CPAP. Will continue to monitor. Dierdre Highman, RN

## 2022-07-24 NOTE — Plan of Care (Signed)
  Problem: Health Behavior/Discharge Planning: Goal: Ability to manage health-related needs will improve Outcome: Progressing   Problem: Nutritional: Goal: Maintenance of adequate nutrition will improve Outcome: Progressing   Problem: Fluid Volume: Goal: Ability to maintain a balanced intake and output will improve Outcome: Completed/Met

## 2022-07-24 NOTE — Progress Notes (Addendum)
      [address]       [place] [address]             [phone]        1 Day Post-Op Procedure(s) (LRB): LEFT STERNOCLAVICULAR JOINT DEBRIDEMENT (Left) APPLICATION OF WOUND VAC (Left)  Subjective: Patient lying in bed. He does not have much pain this am. He had nausea initially returning from OR, but this has resolved.  Objective: Vital signs in last 24 hours: Temp:  [97.2 F (36.2 C)-98.5 F (36.9 C)] 98.1 F (36.7 C) (07/10 0352) Pulse Rate:  [74-92] 74 (07/10 0352) Cardiac Rhythm: Normal sinus rhythm (07/09 2000) Resp:  [14-20] 20 (07/10 0352) BP: (108-169)/(57-80) 141/71 (07/10 0352) SpO2:  [92 %-100 %] 100 % (07/10 0352) Weight:  [94.8 kg] 94.8 kg (07/09 1102)  Current Weight  07/23/22 94.8 kg      Intake/Output from previous day: 07/09 0701 - 07/10 0700 In: 1619.5 [I.V.:1266.4; IV Piggyback:353.1] Out: 20 [Blood:20]   Physical Exam:  Cardiovascular: RRR, Pulmonary: Clear to auscultation bilaterally Abdomen: Soft, non tender, bowel sounds present. Extremities: SCDs in place Wound:VAC in place on left SCV joint. VAC functioning and with bloody like drainage  Lab Results: CBC: Recent Labs    07/23/22 0445 07/24/22 0535  WBC 4.1 4.4  HGB 10.0* 9.9*  HCT 30.8* 31.0*  PLT 239 224   BMET:  Recent Labs    07/23/22 0445 07/24/22 0535  NA 134* 134*  K 3.7 3.8  CL 97* 98  CO2 28 29  GLUCOSE 239* 219*  BUN 10 8  CREATININE 0.57* 0.69  CALCIUM 8.8* 8.7*    PT/INR:  Lab Results  Component Value Date   INR 1.1 07/22/2022   INR 1.1 06/27/2022   INR 0.97 11/18/2017   ABG:  INR: Will add last result for INR, ABG once components are confirmed Will add last 4 CBG results once components are confirmed  Assessment/Plan:  1. CV - SR. Will restart low dose Coreg and Lisinopril. 2.  Pulmonary - On room air. PA/LAT CXR appears stable (moderate cardiomegaly, decreased density in the area of left SCV joint. 3. ID-on Vancomycin.  Gram stain from OR showed rare gram positive cocci. Wound culture from OR pending. Blood culture shows no growth for 2 days. 4.  Expected post op acute blood loss anemia - H and H this am stable at 9.9 and 31. 5. DM-CBGs 227/211/274. On Insulin 20 units bid but will increase for better glucose control. HGA1C 8.6. He will need close medical follow up after discharge 6. Supplement potassium 7. On Lovenox for DVT prophylaxis  Donielle M ZimmermanPA-C 7:20 AM   Patient examined and today's chest x-ray images personally reviewed. Surgical pain well-controlled and patient comfortable without IV dosing Minimal expected serosanguineous drainage from wound VAC with good wound VAC function and normal seal test Operative culture shows gram-positive cocci in pairs-will continue IV vancomycin through previously placed PICC line.  Blood cultures on admission are negative.  Blood glucose levels better but still elevated.  Levemir dosing increased  Plan- Continue IV antibiotics and wound VAC drainage. Will probably return to the OR in 48 hours (7-12) for repeat wound debridement and wound VAC change. Plan of care discussed with patient and questions addressed.  patient examined and medical record reviewed,agree with above note. Lovett Sox 07/24/2022

## 2022-07-24 NOTE — Plan of Care (Signed)
  Problem: Education: Goal: Ability to describe self-care measures that may prevent or decrease complications (Diabetes Survival Skills Education) will improve Outcome: Progressing Goal: Individualized Educational Video(s) Outcome: Progressing   Problem: Coping: Goal: Ability to adjust to condition or change in health will improve Outcome: Progressing   Problem: Health Behavior/Discharge Planning: Goal: Ability to identify and utilize available resources and services will improve Outcome: Progressing Goal: Ability to manage health-related needs will improve Outcome: Progressing   Problem: Metabolic: Goal: Ability to maintain appropriate glucose levels will improve Outcome: Progressing   Problem: Nutritional: Goal: Maintenance of adequate nutrition will improve Outcome: Progressing Goal: Progress toward achieving an optimal weight will improve Outcome: Progressing   Problem: Skin Integrity: Goal: Risk for impaired skin integrity will decrease Outcome: Progressing   Problem: Tissue Perfusion: Goal: Adequacy of tissue perfusion will improve Outcome: Progressing   Problem: Education: Goal: Knowledge of General Education information will improve Description: Including pain rating scale, medication(s)/side effects and non-pharmacologic comfort measures Outcome: Progressing   Problem: Health Behavior/Discharge Planning: Goal: Ability to manage health-related needs will improve Outcome: Progressing   Problem: Clinical Measurements: Goal: Ability to maintain clinical measurements within normal limits will improve Outcome: Progressing Goal: Will remain free from infection Outcome: Progressing Goal: Diagnostic test results will improve Outcome: Progressing Goal: Respiratory complications will improve Outcome: Progressing Goal: Cardiovascular complication will be avoided Outcome: Progressing   Problem: Activity: Goal: Risk for activity intolerance will decrease Outcome:  Progressing   Problem: Nutrition: Goal: Adequate nutrition will be maintained Outcome: Progressing   Problem: Coping: Goal: Level of anxiety will decrease Outcome: Progressing   Problem: Elimination: Goal: Will not experience complications related to bowel motility Outcome: Progressing Goal: Will not experience complications related to urinary retention Outcome: Progressing   Problem: Pain Managment: Goal: General experience of comfort will improve Outcome: Progressing   Problem: Safety: Goal: Ability to remain free from injury will improve Outcome: Progressing   Problem: Skin Integrity: Goal: Risk for impaired skin integrity will decrease Outcome: Progressing   Problem: Education: Goal: Knowledge of disease or condition will improve Outcome: Progressing Goal: Knowledge of the prescribed therapeutic regimen will improve Outcome: Progressing   Problem: Activity: Goal: Risk for activity intolerance will decrease Outcome: Progressing   Problem: Cardiac: Goal: Will achieve and/or maintain hemodynamic stability Outcome: Progressing   Problem: Clinical Measurements: Goal: Postoperative complications will be avoided or minimized Outcome: Progressing   Problem: Respiratory: Goal: Respiratory status will improve Outcome: Progressing   Problem: Pain Management: Goal: Pain level will decrease Outcome: Progressing   Problem: Skin Integrity: Goal: Wound healing without signs and symptoms infection will improve Outcome: Progressing   

## 2022-07-25 LAB — CBC
HCT: 30.8 % — ABNORMAL LOW (ref 39.0–52.0)
Hemoglobin: 9.7 g/dL — ABNORMAL LOW (ref 13.0–17.0)
MCH: 25.9 pg — ABNORMAL LOW (ref 26.0–34.0)
MCHC: 31.5 g/dL (ref 30.0–36.0)
MCV: 82.4 fL (ref 80.0–100.0)
Platelets: 220 10*3/uL (ref 150–400)
RBC: 3.74 MIL/uL — ABNORMAL LOW (ref 4.22–5.81)
RDW: 13.5 % (ref 11.5–15.5)
WBC: 4 10*3/uL (ref 4.0–10.5)
nRBC: 0 % (ref 0.0–0.2)

## 2022-07-25 LAB — COMPREHENSIVE METABOLIC PANEL
ALT: 11 U/L (ref 0–44)
AST: 11 U/L — ABNORMAL LOW (ref 15–41)
Albumin: 2.6 g/dL — ABNORMAL LOW (ref 3.5–5.0)
Alkaline Phosphatase: 98 U/L (ref 38–126)
Anion gap: 9 (ref 5–15)
BUN: 6 mg/dL — ABNORMAL LOW (ref 8–23)
CO2: 29 mmol/L (ref 22–32)
Calcium: 8.7 mg/dL — ABNORMAL LOW (ref 8.9–10.3)
Chloride: 99 mmol/L (ref 98–111)
Creatinine, Ser: 0.59 mg/dL — ABNORMAL LOW (ref 0.61–1.24)
GFR, Estimated: >60 mL/min (ref >60)
Glucose, Bld: 107 mg/dL — ABNORMAL HIGH (ref 70–99)
Potassium: 3.5 mmol/L (ref 3.5–5.1)
Sodium: 137 mmol/L (ref 135–145)
Total Bilirubin: 0.5 mg/dL (ref 0.3–1.2)
Total Protein: 6.2 g/dL — ABNORMAL LOW (ref 6.5–8.1)

## 2022-07-25 LAB — GLUCOSE, CAPILLARY
Glucose-Capillary: 207 mg/dL — ABNORMAL HIGH (ref 70–99)
Glucose-Capillary: 228 mg/dL — ABNORMAL HIGH (ref 70–99)
Glucose-Capillary: 241 mg/dL — ABNORMAL HIGH (ref 70–99)
Glucose-Capillary: 204 mg/dL — ABNORMAL HIGH (ref 70–99)
Glucose-Capillary: 128 mg/dL — ABNORMAL HIGH (ref 70–99)
Glucose-Capillary: 110 mg/dL — ABNORMAL HIGH (ref 70–99)

## 2022-07-25 LAB — VANCOMYCIN, TROUGH: Vancomycin Tr: 9 ug/mL — ABNORMAL LOW (ref 15–20)

## 2022-07-25 MED ORDER — JUVEN PO PACK
1.0000 | PACK | Freq: Two times a day (BID) | ORAL | Status: DC
  Administered 2022-07-26 – 2022-07-31 (×4): 1 via ORAL
  Filled 2022-07-25 (×6): qty 1

## 2022-07-25 MED ORDER — LISINOPRIL 10 MG PO TABS
10.0000 mg | ORAL_TABLET | Freq: Every day | ORAL | Status: DC
  Administered 2022-07-25 – 2022-07-31 (×7): 10 mg via ORAL
  Filled 2022-07-25 (×7): qty 1

## 2022-07-25 MED ORDER — INSULIN DETEMIR 100 UNIT/ML ~~LOC~~ SOLN
30.0000 [IU] | Freq: Two times a day (BID) | SUBCUTANEOUS | Status: DC
  Administered 2022-07-25 – 2022-07-26 (×4): 30 [IU] via SUBCUTANEOUS
  Filled 2022-07-25 (×6): qty 0.3

## 2022-07-25 MED ORDER — POTASSIUM CHLORIDE CRYS ER 20 MEQ PO TBCR
30.0000 meq | EXTENDED_RELEASE_TABLET | Freq: Two times a day (BID) | ORAL | Status: AC
  Administered 2022-07-25 (×2): 30 meq via ORAL
  Filled 2022-07-25 (×2): qty 1

## 2022-07-25 MED ORDER — ADULT MULTIVITAMIN W/MINERALS CH
1.0000 | ORAL_TABLET | Freq: Every day | ORAL | Status: DC
  Administered 2022-07-25 – 2022-07-31 (×6): 1 via ORAL
  Filled 2022-07-25 (×6): qty 1

## 2022-07-25 MED ORDER — INSULIN ASPART 100 UNIT/ML IJ SOLN
15.0000 [IU] | Freq: Three times a day (TID) | INTRAMUSCULAR | Status: DC
  Administered 2022-07-25 – 2022-07-29 (×12): 15 [IU] via SUBCUTANEOUS

## 2022-07-25 MED ORDER — VANCOMYCIN HCL 1500 MG/300ML IV SOLN
1500.0000 mg | Freq: Two times a day (BID) | INTRAVENOUS | Status: DC
  Administered 2022-07-25 – 2022-07-27 (×5): 1500 mg via INTRAVENOUS
  Filled 2022-07-25 (×8): qty 300

## 2022-07-25 MED ORDER — CEFAZOLIN SODIUM-DEXTROSE 2-4 GM/100ML-% IV SOLN
2.0000 g | INTRAVENOUS | Status: AC
  Administered 2022-07-26: 2 g via INTRAVENOUS
  Filled 2022-07-25 (×2): qty 100

## 2022-07-25 MED ORDER — GLUCERNA SHAKE PO LIQD
237.0000 mL | Freq: Three times a day (TID) | ORAL | Status: DC
  Administered 2022-07-25 – 2022-07-29 (×9): 237 mL via ORAL

## 2022-07-25 MED ORDER — CHLORHEXIDINE GLUCONATE 0.12 % MT SOLN
15.0000 mL | Freq: Two times a day (BID) | OROMUCOSAL | Status: DC
  Administered 2022-07-25 – 2022-07-30 (×10): 15 mL via OROMUCOSAL
  Filled 2022-07-25 (×11): qty 15

## 2022-07-25 NOTE — Plan of Care (Signed)
  Problem: Education: Goal: Ability to describe self-care measures that may prevent or decrease complications (Diabetes Survival Skills Education) will improve Outcome: Progressing Goal: Individualized Educational Video(s) Outcome: Progressing   Problem: Coping: Goal: Ability to adjust to condition or change in health will improve Outcome: Progressing   Problem: Health Behavior/Discharge Planning: Goal: Ability to identify and utilize available resources and services will improve Outcome: Progressing Goal: Ability to manage health-related needs will improve Outcome: Progressing   Problem: Metabolic: Goal: Ability to maintain appropriate glucose levels will improve Outcome: Progressing   Problem: Nutritional: Goal: Maintenance of adequate nutrition will improve Outcome: Progressing Goal: Progress toward achieving an optimal weight will improve Outcome: Progressing   Problem: Skin Integrity: Goal: Risk for impaired skin integrity will decrease Outcome: Progressing   Problem: Tissue Perfusion: Goal: Adequacy of tissue perfusion will improve Outcome: Progressing   Problem: Education: Goal: Knowledge of General Education information will improve Description: Including pain rating scale, medication(s)/side effects and non-pharmacologic comfort measures Outcome: Progressing   Problem: Health Behavior/Discharge Planning: Goal: Ability to manage health-related needs will improve Outcome: Progressing   Problem: Clinical Measurements: Goal: Ability to maintain clinical measurements within normal limits will improve Outcome: Progressing Goal: Will remain free from infection Outcome: Progressing Goal: Diagnostic test results will improve Outcome: Progressing Goal: Respiratory complications will improve Outcome: Progressing Goal: Cardiovascular complication will be avoided Outcome: Progressing   Problem: Activity: Goal: Risk for activity intolerance will decrease Outcome:  Progressing   Problem: Nutrition: Goal: Adequate nutrition will be maintained Outcome: Progressing   Problem: Coping: Goal: Level of anxiety will decrease Outcome: Progressing   Problem: Elimination: Goal: Will not experience complications related to bowel motility Outcome: Progressing Goal: Will not experience complications related to urinary retention Outcome: Progressing   Problem: Pain Managment: Goal: General experience of comfort will improve Outcome: Progressing   Problem: Safety: Goal: Ability to remain free from injury will improve Outcome: Progressing   Problem: Skin Integrity: Goal: Risk for impaired skin integrity will decrease Outcome: Progressing   Problem: Education: Goal: Knowledge of disease or condition will improve Outcome: Progressing Goal: Knowledge of the prescribed therapeutic regimen will improve Outcome: Progressing   Problem: Activity: Goal: Risk for activity intolerance will decrease Outcome: Progressing   Problem: Cardiac: Goal: Will achieve and/or maintain hemodynamic stability Outcome: Progressing   Problem: Clinical Measurements: Goal: Postoperative complications will be avoided or minimized Outcome: Progressing   Problem: Respiratory: Goal: Respiratory status will improve Outcome: Progressing   Problem: Pain Management: Goal: Pain level will decrease Outcome: Progressing   Problem: Skin Integrity: Goal: Wound healing without signs and symptoms infection will improve Outcome: Progressing

## 2022-07-25 NOTE — Progress Notes (Addendum)
      [address]       [name] [address]             [phone]        2 Days Post-Op Procedure(s) (LRB): LEFT STERNOCLAVICULAR JOINT DEBRIDEMENT (Left) APPLICATION OF WOUND VAC (Left)  Subjective: Patient lying in bed. He does not have much pain this am. He had nausea initially returning from OR, but this has resolved.  Objective: Vital signs in last 24 hours: Temp:  [97.9 F (36.6 C)-98.1 F (36.7 C)] 98 F (36.7 C) (07/11 0417) Pulse Rate:  [87-93] 93 (07/11 0417) Cardiac Rhythm: Sinus tachycardia;Bundle branch block (07/10 1920) Resp:  [16-20] 20 (07/11 0417) BP: (139-171)/(67-80) 139/75 (07/11 0642) SpO2:  [95 %-97 %] 97 % (07/11 0417)  Current Weight  07/23/22 94.8 kg      Intake/Output from previous day: 07/10 0701 - 07/11 0700 In: 1427.6 [P.O.:180; I.V.:1047.6; IV Piggyback:200] Out: 1840 [Urine:1790; Drains:50]   Physical Exam:  Cardiovascular: RRR, Pulmonary: Clear to auscultation bilaterally Abdomen: Soft, non tender, bowel sounds present. Extremities: SCDs in place Wound:VAC in place on left SCV joint. VAC functioning and with bloody like drainage  Lab Results: CBC: Recent Labs    07/24/22 0535 07/25/22 0515  WBC 4.4 4.0  HGB 9.9* 9.7*  HCT 31.0* 30.8*  PLT 224 220   BMET:  Recent Labs    07/24/22 0535 07/25/22 0515  NA 134* 137  K 3.8 3.5  CL 98 99  CO2 29 29  GLUCOSE 219* 107*  BUN 8 6*  CREATININE 0.69 0.59*  CALCIUM 8.7* 8.7*    PT/INR:  Lab Results  Component Value Date   INR 1.1 07/22/2022   INR 1.1 06/27/2022   INR 0.97 11/18/2017   ABG:  INR: Will add last result for INR, ABG once components are confirmed Will add last 4 CBG results once components are confirmed  Assessment/Plan:  1. CV - SR. Continue Coreg 6.25 mg bid and Lisinopril 10 mg daily. 2.  Pulmonary - On room air. PA/LAT CXR appears stable (moderate cardiomegaly, decreased density in the area of left SCV joint. 3. ID-on  Vancomycin. Gram stain from OR showed rare gram positive cocci. Wound culture from OR shows few Staph Aures;await susceptibilities. Blood culture shows no growth for 3 days. To OR in am for wound vac change 4.  Expected post op acute blood loss anemia - H and H this am stable at 9.7 and 30.8. 5. DM-CBGs 241/298/258. On Insulin 26 units bid but will increase for better glucose control. Also, will add meal coverage and consult inpatient diabetes coordinator.  HGA1C 8.6. He will need close medical follow up after discharge 6. Supplement potassium 7. 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  Because of the purulence encountered in the initial debridement the patient will be scheduled for follow-up surgery including debridement and irrigation and wound VAC replacement tomorrow.  Patient understands the reason for the operation and the associated risks of pain, recurrent infection, bleeding.   patient examined and medical record reviewed,agree with above note. Lovett Sox 07/25/2022

## 2022-07-25 NOTE — Progress Notes (Signed)
Initial Nutrition Assessment  DOCUMENTATION CODES:   Severe malnutrition in context of chronic illness  INTERVENTION:  - Add Glucerna Shake po TID, each supplement provides 220 kcal and 10 grams of protein  - Add -1 packet Juven BID, each packet provides 95 calories, 2.5 grams of protein (collagen), and 9.8 grams of carbohydrate (3 grams sugar); also contains 7 grams of L-arginine and L-glutamine, 300 mg vitamin C, 15 mg vitamin E, 1.2 mcg vitamin B-12, 9.5 mg zinc, 200 mg calcium, and 1.5 g  Calcium Beta-hydroxy-Beta-methylbutyrate to support wound healing  - Add MVI q day.   NUTRITION DIAGNOSIS:   Severe Malnutrition related to chronic illness as evidenced by energy intake < 75% for > or equal to 1 month, percent weight loss.  GOAL:   Patient will meet greater than or equal to 90% of their needs  MONITOR:   PO intake, Supplement acceptance  REASON FOR ASSESSMENT:   Malnutrition Screening Tool    ASSESSMENT:   77 y.o. male admits related to pain and swelling over the left sternoclavicular joint. PMH includes: T2DM, CABG.  Meds reviewed: dulcolax, sliding scale insulin, levemir, klor-con, senokot. Labs reviewed: BUN/Creatinine low.   Pt reports that he has been eating better since admission. He reports that PTA he had no appetite due to the infection altering the way things tasted. He reports a significant amount of weight loss during this time. Per record, pt has experienced a 12% wt loss in <3 months. Pt states that he would be open to trying protein shakes. RD will add Glucerna with meals. Will also add Juven BID to aide in wound healing. Pt is headed to OR tomorrow for debridement, irrigation and Wound VAC change.   NUTRITION - FOCUSED PHYSICAL EXAM:  Flowsheet Row Most Recent Value  Orbital Region Mild depletion  Upper Arm Region Mild depletion  Thoracic and Lumbar Region No depletion  Buccal Region Mild depletion  Temple Region No depletion  Clavicle Bone Region No  depletion  Clavicle and Acromion Bone Region No depletion  Scapular Bone Region Unable to assess  Dorsal Hand No depletion  Patellar Region Mild depletion  Anterior Thigh Region Mild depletion  Posterior Calf Region Mild depletion  Edema (RD Assessment) None  Hair Reviewed  Eyes Reviewed  Mouth Reviewed  Skin Reviewed  Nails Reviewed       Diet Order:   Diet Order             Diet NPO time specified  Diet effective midnight           Diet Carb Modified Fluid consistency: Thin; Room service appropriate? Yes  Diet effective now                   EDUCATION NEEDS:   Not appropriate for education at this time  Skin:  Skin Assessment: Skin Integrity Issues: Skin Integrity Issues:: Incisions Incisions: chest  Last BM:  7/9  Height:   Ht Readings from Last 1 Encounters:  07/23/22 6\' 5"  (1.956 m)    Weight:   Wt Readings from Last 1 Encounters:  07/23/22 94.8 kg    Ideal Body Weight:     BMI:  Body mass index is 24.78 kg/m.  Estimated Nutritional Needs:   Kcal:  2300-2800 kcals  Protein:  115-140 gm  Fluid:  2.3-2.8 L  Bethann Humble, RD, LDN, CNSC.

## 2022-07-25 NOTE — Inpatient Diabetes Management (Signed)
Inpatient Diabetes Program Recommendations  AACE/ADA: New Consensus Statement on Inpatient Glycemic Control (2015)  Target Ranges:  Prepandial:   less than 140 mg/dL      Peak postprandial:   less than 180 mg/dL (1-2 hours)      Critically ill patients:  140 - 180 mg/dL   Lab Results  Component Value Date   GLUCAP 228 (H) 07/25/2022   HGBA1C 8.6 (H) 06/06/2022    Review of Glycemic Control  Latest Reference Range & Units 07/24/22 07:32 07/24/22 11:54 07/24/22 15:47 07/24/22 20:54 07/25/22 08:06 07/25/22 11:29  Glucose-Capillary 70 - 99 mg/dL 161 (H) 096 (H) 045 (H) 258 (H) 207 (H) 228 (H)   Diabetes history: DM 2 Outpatient Diabetes medications: Latus 70-75 units qhs, Novolog 50 units tid Current orders for Inpatient glycemic control:  Levemir 30 units bid Novolog 15 units tid Novolog 0-24 units tid and hs  Inpatient Diabetes Program Recommendations:    Agree with current insulin adjustments for today. Will follow glucose trends.  Thanks,  Christena Deem RN, MSN, BC-ADM Inpatient Diabetes Coordinator Team Pager [phone] (8a-5p)

## 2022-07-25 NOTE — Care Management Important Message (Signed)
Important Message  Patient Details  Name: George [name] MRN: [id number] Date of Birth: [birth year]   Medicare Important Message Given:  Yes     Renie Ora 07/25/2022, 8:58 AM

## 2022-07-25 NOTE — Progress Notes (Signed)
Pharmacy Antibiotic Note  George [name] is a 77 y.o. male admitted on 07/22/2022 worsening L sternoclacivular joint infection with new abscess. Cultures previously grew MSSA and pt was on cefazolin, now adjusted to vancomycin.  Cr is stable, VT (drawn late) is 9 mcg/ml and subtherapeutic - true trough closer to 11 mcg/ml. Will increase vancomycin dosing.  Plan: Increase vancomycin to 1500mg  IV q12h Repeat VT at new Css   Height: 6\' 5"  (195.6 cm) Weight: 94.8 kg (208 lb 15.9 oz) IBW/kg (Calculated) : 89.1  Temp (24hrs), Avg:98 F (36.7 C), Min:97.9 F (36.6 C), Max:98.1 F (36.7 C)  Recent Labs  Lab 07/22/22 1436 07/22/22 1515 07/23/22 0445 07/24/22 0535 07/25/22 0515 07/25/22 1140  WBC 4.5  --  4.1 4.4 4.0  --   CREATININE  --  0.66 0.57* 0.69 0.59*  --   VANCOTROUGH  --   --   --   --   --  9*    Estimated Creatinine Clearance: 97.5 mL/min (A) (by C-G formula based on SCr of 0.59 mg/dL (L)).    No Known Allergies  Antimicrobials this admission: PTA Ancef >>new plan stop date 7/17 per ID clinic note 7/1 (original stop date 7/5 for total of 6 wks) 7/8 Vanc >>   Microbiology results: 7/8 BCx: NGTD 7/9 wound Cx: MSSA   Thank you for allowing pharmacy to be a part of this patient's care.  Fredonia Highland, PharmD, BCPS, [facility] Clinical Pharmacist [phone] Please check AMION for all [facility] numbers 07/25/2022

## 2022-07-25 NOTE — Plan of Care (Signed)
  Problem: Education: Goal: Ability to describe self-care measures that may prevent or decrease complications (Diabetes Survival Skills Education) will improve Outcome: Progressing   Problem: Coping: Goal: Ability to adjust to condition or change in health will improve 07/25/2022 0736 by Lennice Sites, RN Outcome: Progressing 07/25/2022 0057 by Lennice Sites, RN Outcome: Progressing   Problem: Metabolic: Goal: Ability to maintain appropriate glucose levels will improve Outcome: Progressing   Problem: Nutritional: Goal: Maintenance of adequate nutrition will improve 07/25/2022 0736 by Lennice Sites, RN Outcome: Progressing 07/25/2022 0057 by Lennice Sites, RN Outcome: Progressing   Problem: Skin Integrity: Goal: Risk for impaired skin integrity will decrease Outcome: Progressing   Problem: Tissue Perfusion: Goal: Adequacy of tissue perfusion will improve 07/25/2022 0736 by Lennice Sites, RN Outcome: Progressing 07/25/2022 0057 by Lennice Sites, RN Outcome: Progressing   Problem: Clinical Measurements: Goal: Ability to maintain clinical measurements within normal limits will improve Outcome: Progressing

## 2022-07-25 NOTE — Plan of Care (Signed)
  Problem: Coping: Goal: Ability to adjust to condition or change in health will improve Outcome: Progressing   Problem: Metabolic: Goal: Ability to maintain appropriate glucose levels will improve Outcome: Progressing   Problem: Nutritional: Goal: Maintenance of adequate nutrition will improve Outcome: Progressing   Problem: Skin Integrity: Goal: Risk for impaired skin integrity will decrease Outcome: Progressing   Problem: Tissue Perfusion: Goal: Adequacy of tissue perfusion will improve Outcome: Progressing   Problem: Clinical Measurements: Goal: Ability to maintain clinical measurements within normal limits will improve Outcome: Progressing

## 2022-07-26 ENCOUNTER — Other Ambulatory Visit: Payer: Self-pay

## 2022-07-26 ENCOUNTER — Inpatient Hospital Stay (HOSPITAL_COMMUNITY): Payer: Medicare Other | Admitting: Anesthesiology

## 2022-07-26 ENCOUNTER — Encounter (HOSPITAL_COMMUNITY): Payer: Self-pay | Admitting: Cardiothoracic Surgery

## 2022-07-26 ENCOUNTER — Encounter (HOSPITAL_COMMUNITY)
Admission: EM | Disposition: A | Discharge status: Home Health | Payer: Self-pay | Source: Home / Self Care | Attending: Cardiothoracic Surgery

## 2022-07-26 DIAGNOSIS — I110 Hypertensive heart disease with heart failure: Secondary | ICD-10-CM | POA: Diagnosis not present

## 2022-07-26 DIAGNOSIS — E43 Unspecified severe protein-calorie malnutrition: Secondary | ICD-10-CM | POA: Insufficient documentation

## 2022-07-26 DIAGNOSIS — M86612 Other chronic osteomyelitis, left shoulder: Secondary | ICD-10-CM | POA: Diagnosis not present

## 2022-07-26 DIAGNOSIS — I2510 Atherosclerotic heart disease of native coronary artery without angina pectoris: Secondary | ICD-10-CM | POA: Diagnosis not present

## 2022-07-26 DIAGNOSIS — I509 Heart failure, unspecified: Secondary | ICD-10-CM | POA: Diagnosis not present

## 2022-07-26 DIAGNOSIS — M009 Pyogenic arthritis, unspecified: Secondary | ICD-10-CM | POA: Diagnosis not present

## 2022-07-26 HISTORY — PX: STERNAL WOUND DEBRIDEMENT: SHX1058

## 2022-07-26 HISTORY — PX: APPLICATION OF WOUND VAC: SHX5189

## 2022-07-26 LAB — CULTURE, BLOOD (ROUTINE X 2)
Culture: NO GROWTH
Culture: NO GROWTH
Special Requests: ADEQUATE
Special Requests: ADEQUATE

## 2022-07-26 LAB — GLUCOSE, CAPILLARY
Glucose-Capillary: 198 mg/dL — ABNORMAL HIGH (ref 70–99)
Glucose-Capillary: 218 mg/dL — ABNORMAL HIGH (ref 70–99)
Glucose-Capillary: 224 mg/dL — ABNORMAL HIGH (ref 70–99)
Glucose-Capillary: 279 mg/dL — ABNORMAL HIGH (ref 70–99)

## 2022-07-26 LAB — BASIC METABOLIC PANEL
Anion gap: 7 (ref 5–15)
BUN: 9 mg/dL (ref 8–23)
CO2: 28 mmol/L (ref 22–32)
Calcium: 8.8 mg/dL — ABNORMAL LOW (ref 8.9–10.3)
Chloride: 100 mmol/L (ref 98–111)
Creatinine, Ser: 0.69 mg/dL (ref 0.61–1.24)
GFR, Estimated: >60 mL/min (ref >60)
Glucose, Bld: 171 mg/dL — ABNORMAL HIGH (ref 70–99)
Potassium: 4.4 mmol/L (ref 3.5–5.1)
Sodium: 135 mmol/L (ref 135–145)

## 2022-07-26 LAB — TYPE AND SCREEN
ABO/RH(D): O POS
Antibody Screen: NEGATIVE

## 2022-07-26 LAB — CBC
HCT: 30.4 % — ABNORMAL LOW (ref 39.0–52.0)
Hemoglobin: 9.6 g/dL — ABNORMAL LOW (ref 13.0–17.0)
MCH: 25.8 pg — ABNORMAL LOW (ref 26.0–34.0)
MCHC: 31.6 g/dL (ref 30.0–36.0)
MCV: 81.7 fL (ref 80.0–100.0)
Platelets: 223 10*3/uL (ref 150–400)
RBC: 3.72 MIL/uL — ABNORMAL LOW (ref 4.22–5.81)
RDW: 13.7 % (ref 11.5–15.5)
WBC: 4.3 10*3/uL (ref 4.0–10.5)
nRBC: 0 % (ref 0.0–0.2)

## 2022-07-26 SURGERY — DEBRIDEMENT, WOUND, STERNUM
Anesthesia: General | Site: Chest | Laterality: Left

## 2022-07-26 MED ORDER — ORAL CARE MOUTH RINSE: 15.0000 mL | Freq: Once | OROMUCOSAL | Status: AC

## 2022-07-26 MED ORDER — SODIUM CHLORIDE 0.9 % IR SOLN
Status: DC | PRN
  Administered 2022-07-26: 1000 mL

## 2022-07-26 MED ORDER — SUGAMMADEX SODIUM 200 MG/2ML IV SOLN
INTRAVENOUS | Status: DC | PRN
  Administered 2022-07-26: 200 mg via INTRAVENOUS

## 2022-07-26 MED ORDER — ENOXAPARIN SODIUM 40 MG/0.4ML IJ SOSY
40.0000 mg | PREFILLED_SYRINGE | Freq: Every day | INTRAMUSCULAR | Status: DC
  Administered 2022-07-27 – 2022-07-31 (×5): 40 mg via SUBCUTANEOUS
  Filled 2022-07-26 (×5): qty 0.4

## 2022-07-26 MED ORDER — MIDAZOLAM HCL 2 MG/2ML IJ SOLN
INTRAMUSCULAR | Status: AC
  Filled 2022-07-26: qty 2

## 2022-07-26 MED ORDER — OXYCODONE HCL 5 MG/5ML PO SOLN: 5.0000 mg | Freq: Once | ORAL | Status: DC | PRN

## 2022-07-26 MED ORDER — CHLORHEXIDINE GLUCONATE 0.12 % MT SOLN
OROMUCOSAL | Status: AC
  Administered 2022-07-26: 15 mL via OROMUCOSAL
  Filled 2022-07-26: qty 15

## 2022-07-26 MED ORDER — ROCURONIUM BROMIDE 10 MG/ML (PF) SYRINGE
PREFILLED_SYRINGE | INTRAVENOUS | Status: DC | PRN
  Administered 2022-07-26: 30 mg via INTRAVENOUS
  Administered 2022-07-26: 20 mg via INTRAVENOUS

## 2022-07-26 MED ORDER — PROPOFOL 10 MG/ML IV BOLUS
INTRAVENOUS | Status: AC
  Filled 2022-07-26: qty 20

## 2022-07-26 MED ORDER — INSULIN ASPART 100 UNIT/ML IJ SOLN
INTRAMUSCULAR | Status: AC
  Filled 2022-07-26: qty 1

## 2022-07-26 MED ORDER — FENTANYL CITRATE (PF) 250 MCG/5ML IJ SOLN
INTRAMUSCULAR | Status: DC | PRN
  Administered 2022-07-26: 100 ug via INTRAVENOUS

## 2022-07-26 MED ORDER — PROPOFOL 10 MG/ML IV BOLUS
INTRAVENOUS | Status: DC | PRN
  Administered 2022-07-26: 50 mg via INTRAVENOUS
  Administered 2022-07-26: 110 mg via INTRAVENOUS

## 2022-07-26 MED ORDER — ONDANSETRON HCL 4 MG/2ML IJ SOLN: 4.0000 mg | Freq: Once | INTRAMUSCULAR | Status: DC | PRN

## 2022-07-26 MED ORDER — PHENYLEPHRINE 80 MCG/ML (10ML) SYRINGE FOR IV PUSH (FOR BLOOD PRESSURE SUPPORT)
PREFILLED_SYRINGE | INTRAVENOUS | Status: DC | PRN
  Administered 2022-07-26 (×2): 160 ug via INTRAVENOUS
  Administered 2022-07-26: 80 ug via INTRAVENOUS
  Administered 2022-07-26 (×3): 160 ug via INTRAVENOUS
  Administered 2022-07-26: 80 ug via INTRAVENOUS
  Administered 2022-07-26 (×3): 160 ug via INTRAVENOUS

## 2022-07-26 MED ORDER — ACETAMINOPHEN 500 MG PO TABS
1000.0000 mg | ORAL_TABLET | Freq: Once | ORAL | Status: AC
  Administered 2022-07-26: 1000 mg via ORAL
  Filled 2022-07-26: qty 2

## 2022-07-26 MED ORDER — FENTANYL CITRATE (PF) 100 MCG/2ML IJ SOLN: 25.0000 ug | INTRAMUSCULAR | Status: DC | PRN

## 2022-07-26 MED ORDER — CHLORHEXIDINE GLUCONATE 0.12 % MT SOLN
15.0000 mL | Freq: Once | OROMUCOSAL | Status: AC
  Filled 2022-07-26: qty 15

## 2022-07-26 MED ORDER — LIDOCAINE 2% (20 MG/ML) 5 ML SYRINGE
INTRAMUSCULAR | Status: DC | PRN
  Administered 2022-07-26: 80 mg via INTRAVENOUS

## 2022-07-26 MED ORDER — ONDANSETRON HCL 4 MG/2ML IJ SOLN
INTRAMUSCULAR | Status: DC | PRN
  Administered 2022-07-26: 4 mg via INTRAVENOUS

## 2022-07-26 MED ORDER — FENTANYL CITRATE (PF) 250 MCG/5ML IJ SOLN
INTRAMUSCULAR | Status: AC
  Filled 2022-07-26: qty 5

## 2022-07-26 MED ORDER — VASHE WOUND IRRIGATION OPTIME
TOPICAL | Status: DC | PRN
  Administered 2022-07-26: 68 [oz_av]

## 2022-07-26 MED ORDER — INSULIN ASPART 100 UNIT/ML IJ SOLN
0.0000 [IU] | INTRAMUSCULAR | Status: DC | PRN
  Administered 2022-07-26: 2 [IU] via SUBCUTANEOUS

## 2022-07-26 MED ORDER — LACTATED RINGERS IV SOLN: INTRAVENOUS | Status: DC

## 2022-07-26 MED ORDER — OXYCODONE HCL 5 MG PO TABS: 5.0000 mg | ORAL_TABLET | Freq: Once | ORAL | Status: DC | PRN

## 2022-07-26 SURGICAL SUPPLY — 76 items
APL SKNCLS STERI-STRIP NONHPOA (GAUZE/BANDAGES/DRESSINGS)
ATTRACTOMAT 16X20 MAGNETIC DRP (DRAPES) ×2 IMPLANT
BAG DECANTER FOR FLEXI CONT (MISCELLANEOUS) ×2 IMPLANT
BENZOIN TINCTURE PRP APPL 2/3 (GAUZE/BANDAGES/DRESSINGS) IMPLANT
BLADE CLIPPER SURG (BLADE) ×2 IMPLANT
BLADE SURG 10 STRL SS (BLADE) ×2 IMPLANT
BLADE SURG 15 STRL LF DISP TIS (BLADE) IMPLANT
BLADE SURG 15 STRL SS (BLADE)
BNDG GAUZE DERMACEA FLUFF 4 (GAUZE/BANDAGES/DRESSINGS) IMPLANT
BNDG GZE DERMACEA 4 6PLY (GAUZE/BANDAGES/DRESSINGS)
CANISTER SUCT 3000ML PPV (MISCELLANEOUS) ×2 IMPLANT
CANISTER WOUND CARE 500ML ATS (WOUND CARE) ×2 IMPLANT
CANISTER WOUNDNEG PRESSURE 500 (CANNISTER) IMPLANT
CATH FOLEY 2WAY SLVR 5CC 16FR (CATHETERS) IMPLANT
CATH THORACIC 28FR RT ANG (CATHETERS) IMPLANT
CATH THORACIC 36FR (CATHETERS) IMPLANT
CLEANSER WND VASHE 34 (WOUND CARE) IMPLANT
CLIP TI WIDE RED SMALL 24 (CLIP) IMPLANT
CNTNR URN SCR LID CUP LEK RST (MISCELLANEOUS) IMPLANT
CONN Y 3/8X3/8X3/8 BEN (MISCELLANEOUS) IMPLANT
CONT SPEC 4OZ STRL OR WHT (MISCELLANEOUS)
CONTAINER PROTECT SURGISLUSH (MISCELLANEOUS) ×4 IMPLANT
COVER SURGICAL LIGHT HANDLE (MISCELLANEOUS) ×4 IMPLANT
DRAPE DERMATAC (DRAPES) IMPLANT
DRAPE LAPAROSCOPIC ABDOMINAL (DRAPES) ×2 IMPLANT
DRAPE SLUSH/WARMER DISC (DRAPES) IMPLANT
DRAPE WARM FLUID 44X44 (DRAPES) IMPLANT
DRESSING MORCELLS FINE 1000 (Tissue) IMPLANT
DRSG AQUACEL AG ADV 3.5X14 (GAUZE/BANDAGES/DRESSINGS) ×2 IMPLANT
DRSG CUTIMED SORBACT 7X9 (GAUZE/BANDAGES/DRESSINGS) IMPLANT
DRSG VAC GRANUFOAM LG (GAUZE/BANDAGES/DRESSINGS) ×2 IMPLANT
DRSG VAC GRANUFOAM MED (GAUZE/BANDAGES/DRESSINGS) ×2 IMPLANT
DRSG VAC GRANUFOAM SM (GAUZE/BANDAGES/DRESSINGS) ×2 IMPLANT
ELECT REM PT RETURN 9FT ADLT (ELECTROSURGICAL) ×2
ELECTRODE REM PT RTRN 9FT ADLT (ELECTROSURGICAL) ×2 IMPLANT
GAUZE 4X4 16PLY ~~LOC~~+RFID DBL (SPONGE) ×2 IMPLANT
GAUZE PAD ABD 8X10 STRL (GAUZE/BANDAGES/DRESSINGS) IMPLANT
GAUZE SPONGE 4X4 12PLY STRL (GAUZE/BANDAGES/DRESSINGS) ×2 IMPLANT
GAUZE XEROFORM 5X9 LF (GAUZE/BANDAGES/DRESSINGS) IMPLANT
GLOVE BIO SURGEON STRL SZ7.5 (GLOVE) ×4 IMPLANT
GOWN STRL REUS W/ TWL LRG LVL3 (GOWN DISPOSABLE) ×8 IMPLANT
GOWN STRL REUS W/TWL LRG LVL3 (GOWN DISPOSABLE) ×8
GRAFT MYRIAD 7X10 (Graft) IMPLANT
HANDPIECE INTERPULSE COAX TIP (DISPOSABLE) ×2
HEMOSTAT POWDER SURGIFOAM 1G (HEMOSTASIS) IMPLANT
HEMOSTAT SURGICEL 2X14 (HEMOSTASIS) IMPLANT
KIT BASIN OR (CUSTOM PROCEDURE TRAY) ×2 IMPLANT
KIT SUCTION CATH 14FR (SUCTIONS) IMPLANT
KIT TURNOVER KIT B (KITS) ×2 IMPLANT
MARKER SKIN DUAL TIP RULER LAB (MISCELLANEOUS) IMPLANT
NS IRRIG 1000ML POUR BTL (IV SOLUTION) ×2 IMPLANT
PACK CHEST (CUSTOM PROCEDURE TRAY) ×2 IMPLANT
PACK GENERAL/GYN (CUSTOM PROCEDURE TRAY) ×2 IMPLANT
PAD ARMBOARD 7.5X6 YLW CONV (MISCELLANEOUS) ×4 IMPLANT
SET HNDPC FAN SPRY TIP SCT (DISPOSABLE) ×2 IMPLANT
SOL PREP POV-IOD 4OZ 10% (MISCELLANEOUS) IMPLANT
SPONGE T-LAP 18X18 ~~LOC~~+RFID (SPONGE) ×10 IMPLANT
SPONGE T-LAP 4X18 ~~LOC~~+RFID (SPONGE) ×2 IMPLANT
STAPLER VISISTAT 35W (STAPLE) IMPLANT
SUT ETHILON 3 0 FSL (SUTURE) IMPLANT
SUT STEEL 6MS V (SUTURE) IMPLANT
SUT STEEL STERNAL CCS#1 18IN (SUTURE) IMPLANT
SUT STEEL SZ 6 DBL 3X14 BALL (SUTURE) IMPLANT
SUT VIC AB 1 CTX 36 (SUTURE)
SUT VIC AB 1 CTX36XBRD ANBCTR (SUTURE) ×4 IMPLANT
SUT VIC AB 2-0 CTX 27 (SUTURE) ×4 IMPLANT
SUT VIC AB 3-0 SH 18 (SUTURE) IMPLANT
SUT VIC AB 3-0 SH 8-18 (SUTURE) IMPLANT
SUT VIC AB 3-0 X1 27 (SUTURE) ×4 IMPLANT
SWAB COLLECTION DEVICE MRSA (MISCELLANEOUS) IMPLANT
SWAB CULTURE ESWAB REG 1ML (MISCELLANEOUS) IMPLANT
SYR 5ML LL (SYRINGE) IMPLANT
TOWEL GREEN STERILE (TOWEL DISPOSABLE) ×2 IMPLANT
TOWEL GREEN STERILE FF (TOWEL DISPOSABLE) ×2 IMPLANT
TRAY FOLEY MTR SLVR 16FR STAT (SET/KITS/TRAYS/PACK) IMPLANT
WATER STERILE IRR 1000ML POUR (IV SOLUTION) ×2 IMPLANT

## 2022-07-26 NOTE — Inpatient Diabetes Management (Signed)
Inpatient Diabetes Program Recommendations  AACE/ADA: New Consensus Statement on Inpatient Glycemic Control (2015)  Target Ranges:  Prepandial:   less than 140 mg/dL      Peak postprandial:   less than 180 mg/dL (1-2 hours)      Critically ill patients:  140 - 180 mg/dL   Lab Results  Component Value Date   GLUCAP 218 (H) 07/26/2022   HGBA1C 8.6 (H) 06/06/2022    Review of Glycemic Control  Latest Reference Range & Units 07/25/22 08:06 07/25/22 11:29 07/25/22 13:22 07/25/22 16:15 07/25/22 17:49 07/25/22 20:23 07/26/22 07:58 07/26/22 11:47  Glucose-Capillary 70 - 99 mg/dL 161 (H) 096 (H) 045 (H) 204 (H) 128 (H) 110 (H) 198 (H) 218 (H)   Diabetes history: DM 2 Outpatient Diabetes medications: Latus 70-75 units qhs, Novolog 50 units tid Current orders for Inpatient glycemic control:  Levemir 30 units bid Novolog 15 units tid Novolog 0-24 units tid and hs  Glucerna tid between meals  Inpatient Diabetes Program Recommendations:    -  Increase Semglee to 35 units bid  Thanks,  Christena Deem RN, MSN, BC-ADM Inpatient Diabetes Coordinator Team Pager [phone] (8a-5p)

## 2022-07-26 NOTE — Progress Notes (Signed)
Pre Procedure note for inpatients:   George [name] has been scheduled for Procedure(s): LEFT STERNOCLAVICULAR JOINT DEBRIDEMENT (Left) APPLICATION OF WOUND VAC (Left) today. The various methods of treatment have been discussed with the patient. After consideration of the risks, benefits and treatment options the patient has consented to the planned procedure.   The patient has been seen and labs reviewed. There are no changes in the patient's condition to prevent proceeding with the planned procedure today.  Recent labs:  Lab Results  Component Value Date   WBC 4.3 07/26/2022   HGB 9.6 (L) 07/26/2022   HCT 30.4 (L) 07/26/2022   PLT 223 07/26/2022   GLUCOSE 171 (H) 07/26/2022   CHOL 144 06/06/2022   TRIG 285 (H) 06/06/2022   HDL 37 (L) 06/06/2022   LDLCALC 50 06/06/2022   ALT 11 07/25/2022   AST 11 (L) 07/25/2022   NA 135 07/26/2022   K 4.4 07/26/2022   CL 100 07/26/2022   CREATININE 0.69 07/26/2022   BUN 9 07/26/2022   CO2 28 07/26/2022   TSH 2.92 04/23/2017   INR 1.1 07/22/2022   HGBA1C 8.6 (H) 06/06/2022    Lovett Sox, MD 07/26/2022 7:42 AM

## 2022-07-26 NOTE — Transfer of Care (Signed)
Immediate Anesthesia Transfer of Care Note  Patient: George [name]  Procedure(s) Performed: LEFT STERNOCLAVICULAR JOINT DEBRIDEMENT  WITH MYRIAD PLACEMENT (Left) APPLICATION OF WOUND VAC  CHANGE (Left: Chest)  Patient Location: PACU  Anesthesia Type:General  Level of Consciousness: awake and alert   Airway & Oxygen Therapy: Patient Spontanous Breathing and Patient connected to nasal cannula oxygen  Post-op Assessment: Report given to RN and Post -op Vital signs reviewed and stable  Post vital signs: Reviewed and stable  Last Vitals:  Vitals Value Taken Time  BP 160/79 07/26/22 1142  Temp    Pulse 81 07/26/22 1144  Resp 14 07/26/22 1144  SpO2 98 % 07/26/22 1144  Vitals shown include unfiled device data.  Last Pain:  Vitals:   07/26/22 0815  TempSrc:   PainSc: 0-No pain      Patients Stated Pain Goal: 2 (07/25/22 0501)  Complications: No notable events documented.

## 2022-07-26 NOTE — Anesthesia Postprocedure Evaluation (Signed)
Anesthesia Post Note  Patient: George [name]  Procedure(s) Performed: LEFT STERNOCLAVICULAR JOINT DEBRIDEMENT  WITH MYRIAD PLACEMENT (Left) APPLICATION OF WOUND VAC  CHANGE (Left: Chest)     Patient location during evaluation: PACU Anesthesia Type: General Level of consciousness: awake and alert Pain management: pain level controlled Vital Signs Assessment: post-procedure vital signs reviewed and stable Respiratory status: spontaneous breathing, nonlabored ventilation, respiratory function stable and patient connected to nasal cannula oxygen Cardiovascular status: blood pressure returned to baseline and stable Postop Assessment: no apparent nausea or vomiting Anesthetic complications: no   No notable events documented.  Last Vitals:  Vitals:   07/26/22 1212 07/26/22 1229  BP: (!) 164/84 (!) 166/80  Pulse:  84  Resp:  18  Temp: 36.5 C 37 C  SpO2:  98%    Last Pain:  Vitals:   07/26/22 1229  TempSrc: Oral  PainSc: 6                  Beryle Lathe

## 2022-07-26 NOTE — Brief Op Note (Signed)
07/26/2022  11:53 AM  PATIENT:  [name]  77 y.o. male  PRE-OPERATIVE DIAGNOSIS:  Evans JOINT ABSCESS  POST-OPERATIVE DIAGNOSIS:  * No post-op diagnosis entered *  PROCEDURE:  Procedure(s): LEFT STERNOCLAVICULAR JOINT DEBRIDEMENT  WITH MYRIAD PLACEMENT (Left) APPLICATION OF WOUND VAC  CHANGE (Left) Excisional Debridement of bone,fascia   SURGEON:  Surgeons and Role:    Lovett Sox, MD - Primary  PHYSICIAN ASSISTANT:   ASSISTANTS: none   ANESTHESIA:   general  EBL:  5 ml   BLOOD ADMINISTERED:none  DRAINS: none   LOCAL MEDICATIONS USED:  NONE  SPECIMEN:  No Specimen  DISPOSITION OF SPECIMEN:  N/A  COUNTS:  YES  TOURNIQUET:  * No tourniquets in log *  DICTATION: .Dragon Dictation  PLAN OF CARE:  return to 6E-16  PATIENT DISPOSITION:  PACU - hemodynamically stable.   Delay start of Pharmacological VTE agent (>24hrs) due to surgical blood loss or risk of bleeding: yes Hold lovenox 24 hrs

## 2022-07-26 NOTE — Op Note (Signed)
George [name], George [name] MEDICAL RECORD NO: [id number] ACCOUNT NO: [id number] DATE OF BIRTH: 06-08-1945 FACILITY: [facility] LOCATION: [facility] PHYSICIAN: Kerin Perna III, MD  Operative Report   DATE OF PROCEDURE: 07/26/2022  OPERATION:   1.  Excisional debridement of left sternoclavicular joint abscess with excision of clavicular bone and the synovial joint connective tissue. 2.  Wound VAC change. 3.  Application of tissue matrix Myriad powder and a 7 x 10 cm sheet.  WOUND MEASUREMENTS:  5 cm long x 5 cm deep x 2 cm wide.  ANESTHESIA:  General.  SURGEON:  Kerin Perna III, MD  DESCRIPTION OF PROCEDURE:  The patient was evaluated in preoperative holding where informed consent was documented and the procedure was again reviewed with the patient and wife including the benefits and potential risks.  The patient was then taken to  the operating room by the anesthesia team and placed supine on the operating table.  The patient was positioned and then induced for anesthesia, intubated and placed on the ventilator.  The previously placed wound VAC sheet and sponge were then removed  and the chest and neck were prepped and draped as a sterile field.  A proper timeout was performed.  The wound was inspected.  The deep remaining wound sponge was removed.  The wound measured 5 cm deep, 2 cm wide and 5 cm long.  There is no purulence.  There was minimal healing since the previous procedure.  I then performed debridement including removal of a free segment of the head of the left clavicle as well as the surrounding joint capsule, which was indurated and inflamed in the synovial membrane.  After clearing all this out and performing some  curettage of the general wound tissue the wound was irrigated with Vashe solution using the pulse lavage.  After the pulse lavage irrigation, the wound appeared to be clean and hemostasis was achieved.  I placed some myriad powder in the depths of the  wound covered with a  myriad sheet and then tacked a Sorbact sheet with interrupted 3-0 Vicryl sutures on top of the matrix material.  Next, we cut a small sponge to the appropriate size and configuration to cover the Sorbact and placed this into the wound and covered it with a wound VAC sheets to which the wound VAC suction catheter was applied.  The system was activated.  There was  good compression of the sponge with no air leak on the seal check.  The patient was then reversed from anesthesia, extubated, and observed and then returned to the recovery room in stable condition.  Blood loss was minimal.   PUS D: 07/26/2022 2:48:09 pm T: 07/26/2022 3:14:00 pm  JOB: [id number]/ [id number]

## 2022-07-26 NOTE — Anesthesia Procedure Notes (Signed)
Procedure Name: Intubation Date/Time: 07/26/2022 10:23 AM  Performed by: Randon Goldsmith, CRNAPre-anesthesia Checklist: Patient identified, Emergency Drugs available, Suction available and Patient being monitored Patient Re-evaluated:Patient Re-evaluated prior to induction Oxygen Delivery Method: Circle system utilized Preoxygenation: Pre-oxygenation with 100% oxygen Induction Type: IV induction Ventilation: Mask ventilation without difficulty Laryngoscope Size: Glidescope and 4 Grade View: Grade I Tube type: Oral Tube size: 7.5 mm Number of attempts: 3 Airway Equipment and Method: Stylet and Oral airway Placement Confirmation: ETT inserted through vocal cords under direct vision, positive ETCO2 and breath sounds checked- equal and bilateral Secured at: 22 cm Tube secured with: Tape Dental Injury: Teeth and Oropharynx as per pre-operative assessment  Comments: DL x1 with Mac 4 blade, grade 3 view. DL x2 Mac 4 blade, grade 2a view unable to pass tube through the cords. DL x3 with Glidescope 4 grade 1 view, cords still moving, 7.5 ETT gently passed through the cords

## 2022-07-26 NOTE — Anesthesia Preprocedure Evaluation (Addendum)
Anesthesia Evaluation  Patient identified by MRN, date of birth, ID band Patient awake    Reviewed: Allergy & Precautions, NPO status , Patient's Chart, lab work & pertinent test results, reviewed documented beta blocker date and time   History of Anesthesia Complications Negative for: history of anesthetic complications  Airway Mallampati: II  TM Distance: >3 FB Neck ROM: Full    Dental  (+) Dental Advisory Given   Pulmonary former smoker   Pulmonary exam normal        Cardiovascular hypertension, Pt. on medications and Pt. on home beta blockers + CAD, + Past MI, + CABG and +CHF  Normal cardiovascular exam+ Valvular Problems/Murmurs AI    '24 TEE - EF 45 to 50%. Global hypokinesis. Mild mitral valve regurgitation. Eccentric anteriorly directed aortic regurgitation. Aortic valve regurgitation is mild to moderate.     Neuro/Psych negative neurological ROS  negative psych ROS   GI/Hepatic Neg liver ROS,GERD  Controlled,,  Endo/Other  diabetes, Type 2, Insulin Dependent    Renal/GU negative Renal ROS    Prostate cancer     Musculoskeletal  (+) Arthritis ,    Abdominal   Peds  Hematology  (+) Blood dyscrasia, anemia   Anesthesia Other Findings Hard of hearing Claustrophobia   Reproductive/Obstetrics                              Anesthesia Physical Anesthesia Plan  ASA: 3  Anesthesia Plan: General   Post-op Pain Management: Tylenol PO (pre-op)*   Induction: Intravenous  PONV Risk Score and Plan: 2 and Treatment may vary due to age or medical condition, Ondansetron and Propofol infusion  Airway Management Planned: Oral ETT  Additional Equipment: None  Intra-op Plan:   Post-operative Plan: Extubation in OR  Informed Consent: I have reviewed the patients History and Physical, chart, labs and discussed the procedure including the risks, benefits and alternatives for the  proposed anesthesia with the patient or authorized representative who has indicated his/her understanding and acceptance.     Dental advisory given  Plan Discussed with: CRNA and Anesthesiologist  Anesthesia Plan Comments:          Anesthesia Quick Evaluation

## 2022-07-27 ENCOUNTER — Encounter (HOSPITAL_COMMUNITY): Payer: Self-pay | Admitting: Cardiothoracic Surgery

## 2022-07-27 LAB — BASIC METABOLIC PANEL
Anion gap: 6 (ref 5–15)
BUN: 9 mg/dL (ref 8–23)
CO2: 28 mmol/L (ref 22–32)
Calcium: 8.9 mg/dL (ref 8.9–10.3)
Chloride: 100 mmol/L (ref 98–111)
Creatinine, Ser: 0.65 mg/dL (ref 0.61–1.24)
GFR, Estimated: >60 mL/min (ref >60)
Glucose, Bld: 165 mg/dL — ABNORMAL HIGH (ref 70–99)
Potassium: 3.9 mmol/L (ref 3.5–5.1)
Sodium: 134 mmol/L — ABNORMAL LOW (ref 135–145)

## 2022-07-27 LAB — GLUCOSE, CAPILLARY
Glucose-Capillary: 139 mg/dL — ABNORMAL HIGH (ref 70–99)
Glucose-Capillary: 216 mg/dL — ABNORMAL HIGH (ref 70–99)
Glucose-Capillary: 154 mg/dL — ABNORMAL HIGH (ref 70–99)
Glucose-Capillary: 102 mg/dL — ABNORMAL HIGH (ref 70–99)

## 2022-07-27 LAB — CBC
HCT: 31 % — ABNORMAL LOW (ref 39.0–52.0)
Hemoglobin: 9.7 g/dL — ABNORMAL LOW (ref 13.0–17.0)
MCH: 25.9 pg — ABNORMAL LOW (ref 26.0–34.0)
MCHC: 31.3 g/dL (ref 30.0–36.0)
MCV: 82.7 fL (ref 80.0–100.0)
Platelets: 228 10*3/uL (ref 150–400)
RBC: 3.75 MIL/uL — ABNORMAL LOW (ref 4.22–5.81)
RDW: 13.4 % (ref 11.5–15.5)
WBC: 4.8 10*3/uL (ref 4.0–10.5)
nRBC: 0 % (ref 0.0–0.2)

## 2022-07-27 MED ORDER — INSULIN DETEMIR 100 UNIT/ML ~~LOC~~ SOLN
34.0000 [IU] | Freq: Two times a day (BID) | SUBCUTANEOUS | Status: DC
  Filled 2022-07-27 (×2): qty 0.34

## 2022-07-27 MED ORDER — CARVEDILOL 12.5 MG PO TABS
12.5000 mg | ORAL_TABLET | Freq: Two times a day (BID) | ORAL | Status: DC
  Administered 2022-07-27 – 2022-07-31 (×8): 12.5 mg via ORAL
  Filled 2022-07-27 (×8): qty 1

## 2022-07-27 MED ORDER — FENTANYL CITRATE PF 50 MCG/ML IJ SOSY
25.0000 ug | PREFILLED_SYRINGE | INTRAMUSCULAR | Status: DC | PRN
  Filled 2022-07-27: qty 1

## 2022-07-27 MED ORDER — INSULIN DETEMIR 100 UNIT/ML ~~LOC~~ SOLN
35.0000 [IU] | Freq: Two times a day (BID) | SUBCUTANEOUS | Status: DC
  Administered 2022-07-27 – 2022-07-29 (×6): 35 [IU] via SUBCUTANEOUS
  Filled 2022-07-27 (×8): qty 0.35

## 2022-07-27 MED ORDER — BISMUTH SUBSALICYLATE 262 MG PO CHEW
524.0000 mg | CHEWABLE_TABLET | ORAL | Status: DC | PRN
  Filled 2022-07-27: qty 2

## 2022-07-27 MED ORDER — OXYCODONE HCL 5 MG PO TABS
5.0000 mg | ORAL_TABLET | Freq: Four times a day (QID) | ORAL | Status: DC | PRN
  Administered 2022-07-28 – 2022-07-31 (×7): 10 mg via ORAL
  Filled 2022-07-27 (×8): qty 2

## 2022-07-27 NOTE — Progress Notes (Signed)
      [address]       [name] [address]             [phone]        1 Day Post-Op Procedure(s) (LRB): LEFT STERNOCLAVICULAR JOINT DEBRIDEMENT  WITH MYRIAD PLACEMENT (Left) APPLICATION OF WOUND VAC  CHANGE (Left)  Subjective: Patient had a lot of pain earlier this am. He is about to eat breakfast.  Objective: Vital signs in last 24 hours: Temp:  [97.7 F (36.5 C)-98.6 F (37 C)] 98.1 F (36.7 C) (07/13 0815) Pulse Rate:  [74-87] 80 (07/13 0815) Cardiac Rhythm: Normal sinus rhythm (07/13 0745) Resp:  [12-21] 20 (07/13 0815) BP: (132-177)/(69-104) 177/70 (07/13 0815) SpO2:  [89 %-99 %] 98 % (07/13 0815)  Current Weight  07/26/22 95 kg      Intake/Output from previous day: 07/12 0701 - 07/13 0700 In: 1934 [P.O.:234; I.V.:1000; IV Piggyback:700] Out: 2700 [Urine:2700]   Physical Exam:  Cardiovascular: RRR, Pulmonary: Clear to auscultation bilaterally Wound:VAC in place on left SCV joint. VAC functioning and with bloody like drainage  Lab Results: CBC: Recent Labs    07/26/22 0502 07/27/22 0431  WBC 4.3 4.8  HGB 9.6* 9.7*  HCT 30.4* 31.0*  PLT 223 228   BMET:  Recent Labs    07/26/22 0502 07/27/22 0431  NA 135 134*  K 4.4 3.9  CL 100 100  CO2 28 28  GLUCOSE 171* 165*  BUN 9 9  CREATININE 0.69 0.65  CALCIUM 8.8* 8.9    PT/INR:  Lab Results  Component Value Date   INR 1.1 07/22/2022   INR 1.1 06/27/2022   INR 0.97 11/18/2017   ABG:  INR: Will add last result for INR, ABG once components are confirmed Will add last 4 CBG results once components are confirmed  Assessment/Plan:  1. CV - SR. More hypertensive this am. On Coreg 6.25 mg bid and Lisinopril 10 mg daily. Will increase Coreg to 12.5 mg bid as taken prior to admission. 2.  Pulmonary - On room air.  3. ID-on Vancomycin for MSSA 4.  Expected post op acute blood loss anemia - H and H this am stable at 9.7 and 31. 5. DM-CBGs 224/279/139. On Insulin 30 units bid  but will increase again for better glucose control. Also, on meal coverage 15 units tid. HGA1C 8.6. He will need close medical follow up after discharge 6. On Lovenox for DVT prophylaxis 7. Regarding pain control, on scheduled Tylenol, Fentanyl IV PRN 6 hours, Oxy IR PRN 4 hours. Will change Fentanyl and Oxy IR schedule, being careful not to over narcotize for better pain control  Lelon Huh ZimmermanPA-C 8:23 AM 07/27/2022

## 2022-07-28 LAB — AEROBIC/ANAEROBIC CULTURE W GRAM STAIN (SURGICAL/DEEP WOUND)

## 2022-07-28 LAB — GLUCOSE, CAPILLARY
Glucose-Capillary: 140 mg/dL — ABNORMAL HIGH (ref 70–99)
Glucose-Capillary: 223 mg/dL — ABNORMAL HIGH (ref 70–99)
Glucose-Capillary: 185 mg/dL — ABNORMAL HIGH (ref 70–99)
Glucose-Capillary: 105 mg/dL — ABNORMAL HIGH (ref 70–99)

## 2022-07-28 MED ORDER — CEFAZOLIN SODIUM-DEXTROSE 2-4 GM/100ML-% IV SOLN
2.0000 g | Freq: Three times a day (TID) | INTRAVENOUS | Status: DC
  Administered 2022-07-28 – 2022-07-30 (×8): 2 g via INTRAVENOUS
  Filled 2022-07-28 (×10): qty 100

## 2022-07-28 MED ORDER — CEFAZOLIN SODIUM-DEXTROSE 2-4 GM/100ML-% IV SOLN: 2.0000 g | Freq: Three times a day (TID) | INTRAVENOUS | Status: DC

## 2022-07-28 NOTE — Progress Notes (Addendum)
      [address]       [place] [address]             [phone]        2 Days Post-Op Procedure(s) (LRB): LEFT STERNOCLAVICULAR JOINT DEBRIDEMENT  WITH MYRIAD PLACEMENT (Left) APPLICATION OF WOUND VAC  CHANGE (Left)  Subjective: Patient with much less pain at left SCV joint this am. He is eating breakfast.  Objective: Vital signs in last 24 hours: Temp:  [98.1 F (36.7 C)-98.9 F (37.2 C)] 98.9 F (37.2 C) (07/14 0732) Pulse Rate:  [76-90] 90 (07/14 0732) Cardiac Rhythm: Normal sinus rhythm;Heart block (07/13 1903) Resp:  [16-20] 16 (07/14 0732) BP: (128-177)/(66-74) 158/73 (07/14 0732) SpO2:  [96 %-100 %] 100 % (07/14 0732)  Current Weight  07/26/22 95 kg      Intake/Output from previous day: 07/13 0701 - 07/14 0700 In: 301.5 [IV Piggyback:301.5] Out: 4350 [Urine:4350]   Physical Exam:  Cardiovascular: RRR, Pulmonary: Clear to auscultation bilaterally Wound:VAC in place on left SCV joint. VAC functioning and with bloody like drainage  Lab Results: CBC: Recent Labs    07/26/22 0502 07/27/22 0431  WBC 4.3 4.8  HGB 9.6* 9.7*  HCT 30.4* 31.0*  PLT 223 228   BMET:  Recent Labs    07/26/22 0502 07/27/22 0431  NA 135 134*  K 4.4 3.9  CL 100 100  CO2 28 28  GLUCOSE 171* 165*  BUN 9 9  CREATININE 0.69 0.65  CALCIUM 8.8* 8.9    PT/INR:  Lab Results  Component Value Date   INR 1.1 07/22/2022   INR 1.1 06/27/2022   INR 0.97 11/18/2017   ABG:  INR: Will add last result for INR, ABG once components are confirmed Will add last 4 CBG results once components are confirmed  Assessment/Plan:  1. CV - SR. More hypertensive this am. On Coreg 12.5 mg bid and Lisinopril 10 mg daily.  2.  Pulmonary - On room air.  3. ID-Now on Cefazolin for MSSA 4.  Expected post op acute blood loss anemia - H and H this am stable at 9.7 and 31. 5. DM-CBGs 154/102/140. On Insulin 35 units bid with better glucose control. Also, on meal coverage 15  units tid. HGA1C 8.6. He will need close medical follow up after discharge 6. On Lovenox for DVT prophylaxis   Donielle M ZimmermanPA-C 7:45 AM 07/27/2022  Agree with above OR on tues  Mayrene Bastarache O Briawna Carver

## 2022-07-28 NOTE — Plan of Care (Signed)
  Problem: Coping: Goal: Ability to adjust to condition or change in health will improve Outcome: Progressing   

## 2022-07-29 ENCOUNTER — Inpatient Hospital Stay (HOSPITAL_COMMUNITY): Payer: Medicare Other

## 2022-07-29 LAB — BASIC METABOLIC PANEL
Anion gap: 9 (ref 5–15)
BUN: 10 mg/dL (ref 8–23)
CO2: 26 mmol/L (ref 22–32)
Calcium: 8.7 mg/dL — ABNORMAL LOW (ref 8.9–10.3)
Chloride: 98 mmol/L (ref 98–111)
Creatinine, Ser: 0.66 mg/dL (ref 0.61–1.24)
GFR, Estimated: >60 mL/min (ref >60)
Glucose, Bld: 174 mg/dL — ABNORMAL HIGH (ref 70–99)
Potassium: 3.7 mmol/L (ref 3.5–5.1)
Sodium: 133 mmol/L — ABNORMAL LOW (ref 135–145)

## 2022-07-29 LAB — GLUCOSE, CAPILLARY
Glucose-Capillary: 230 mg/dL — ABNORMAL HIGH (ref 70–99)
Glucose-Capillary: 332 mg/dL — ABNORMAL HIGH (ref 70–99)
Glucose-Capillary: 182 mg/dL — ABNORMAL HIGH (ref 70–99)
Glucose-Capillary: 94 mg/dL (ref 70–99)

## 2022-07-29 MED ORDER — POTASSIUM CHLORIDE CRYS ER 20 MEQ PO TBCR
40.0000 meq | EXTENDED_RELEASE_TABLET | Freq: Once | ORAL | Status: AC
  Administered 2022-07-29: 40 meq via ORAL
  Filled 2022-07-29: qty 2

## 2022-07-29 NOTE — TOC Progression Note (Signed)
Transition of Care [facility]) - Progression Note    Patient Details  Name: George [name] MRN: [id number] Date of Birth: 07-08-1945  Transition of Care [facility] CM/SW Contact  Tom-Johnson, Hershal Coria, RN Phone Number: 07/29/2022, 5:22 PM  Clinical Narrative:     Patient is S/P day 3 left SCV Joint Debridement with Wound Vac placement. Continues on IV abx. Cardiothoracic Sx following.   CM will continue to follow as patient progresses with care towards discharge.       Expected Discharge Plan and Services                                               Social Determinants of Health (SDOH) Interventions SDOH Screenings   Food Insecurity: No Food Insecurity (07/22/2022)  Housing: Patient Declined (07/22/2022)  Transportation Needs: No Transportation Needs (07/22/2022)  Utilities: Not At Risk (07/22/2022)  Alcohol Screen: Low Risk  (06/24/2022)  Depression (PHQ2-9): Low Risk  (06/26/2022)  Financial Resource Strain: Low Risk  (06/24/2022)  Physical Activity: Insufficiently Active (06/24/2022)  Social Connections: Unknown (07/04/2022)   Received from [facility]  Stress: No Stress Concern Present (06/24/2022)  Tobacco Use: Medium Risk (07/26/2022)    Readmission Risk Interventions     No data to display

## 2022-07-29 NOTE — Plan of Care (Signed)
  Problem: Education: Goal: Ability to describe self-care measures that may prevent or decrease complications (Diabetes Survival Skills Education) will improve Outcome: Progressing   Problem: Coping: Goal: Ability to adjust to condition or change in health will improve Outcome: Progressing   

## 2022-07-29 NOTE — Progress Notes (Addendum)
      [address]       [place] [address]             [phone]        3 Days Post-Op Procedure(s) (LRB): LEFT STERNOCLAVICULAR JOINT DEBRIDEMENT  WITH MYRIAD PLACEMENT (Left) APPLICATION OF WOUND VAC  CHANGE (Left)  Subjective: Patient sleeping and briefly awakened. He does not have too much pain left SCV joint wound/VAC this am.  Objective: Vital signs in last 24 hours: Temp:  [98.1 F (36.7 C)-98.9 F (37.2 C)] 98.3 F (36.8 C) (07/15 0300) Pulse Rate:  [76-90] 88 (07/15 0300) Cardiac Rhythm: Heart block (07/14 2100) Resp:  [16-18] 18 (07/15 0300) BP: (138-170)/(67-133) 138/70 (07/15 0300) SpO2:  [96 %-100 %] 96 % (07/15 0300)  Current Weight  07/26/22 95 kg      Intake/Output from previous day: 07/14 0701 - 07/15 0700 In: 871 [P.O.:585; IV Piggyback:286] Out: 2000 [Urine:1900; Drains:100]   Physical Exam:  Cardiovascular: RRR Wound:VAC in place on left SCV joint. VAC functioning and with bloody like drainage  Lab Results: CBC: Recent Labs    07/27/22 0431  WBC 4.8  HGB 9.7*  HCT 31.0*  PLT 228   BMET:  Recent Labs    07/27/22 0431 07/29/22 0307  NA 134* 133*  K 3.9 3.7  CL 100 98  CO2 28 26  GLUCOSE 165* 174*  BUN 9 10  CREATININE 0.65 0.66  CALCIUM 8.9 8.7*    PT/INR:  Lab Results  Component Value Date   INR 1.1 07/22/2022   INR 1.1 06/27/2022   INR 0.97 11/18/2017   ABG:  INR: Will add last result for INR, ABG once components are confirmed Will add last 4 CBG results once components are confirmed  Assessment/Plan:  1. CV - SR at the time of my exam. More hypertensive this am. On Coreg 12.5 mg bid and Lisinopril 10 mg daily.  2.  Pulmonary - On room air.  3. ID-Now on Cefazolin for MSSA 4.  Expected post op acute blood loss anemia - H and H this am stable at 9.7 and 31. 5. DM-CBGs 223/185/105. On Insulin 35 units bid and on meal coverage 15 units tid. HGA1C 8.6. He will need close medical follow up after  discharge 6. On Lovenox for DVT prophylaxis 7. Supplement potassium 8. To OR in am for debridement of left SCV joint and VAC change  Donielle M ZimmermanPA-C 7:01 AM 07/29/2022   patient examined and medical record reviewed,agree with above note.Will examine wound in OR and probably transition to packing with Vashe wound solution. Will finish course of iv Ancef at home with pre-existing PICC line Lovett Sox 07/30/2022

## 2022-07-29 NOTE — Plan of Care (Signed)
  Problem: Education: Goal: Ability to describe self-care measures that may prevent or decrease complications (Diabetes Survival Skills Education) will improve Outcome: Progressing Goal: Individualized Educational Video(s) Outcome: Progressing   Problem: Coping: Goal: Ability to adjust to condition or change in health will improve Outcome: Progressing   Problem: Health Behavior/Discharge Planning: Goal: Ability to identify and utilize available resources and services will improve Outcome: Progressing Goal: Ability to manage health-related needs will improve Outcome: Progressing   Problem: Metabolic: Goal: Ability to maintain appropriate glucose levels will improve Outcome: Progressing   Problem: Nutritional: Goal: Maintenance of adequate nutrition will improve Outcome: Progressing Goal: Progress toward achieving an optimal weight will improve Outcome: Progressing   Problem: Skin Integrity: Goal: Risk for impaired skin integrity will decrease Outcome: Progressing   Problem: Tissue Perfusion: Goal: Adequacy of tissue perfusion will improve Outcome: Progressing   Problem: Education: Goal: Knowledge of General Education information will improve Description: Including pain rating scale, medication(s)/side effects and non-pharmacologic comfort measures Outcome: Progressing   Problem: Health Behavior/Discharge Planning: Goal: Ability to manage health-related needs will improve Outcome: Progressing   Problem: Clinical Measurements: Goal: Ability to maintain clinical measurements within normal limits will improve Outcome: Progressing Goal: Will remain free from infection Outcome: Progressing Goal: Diagnostic test results will improve Outcome: Progressing Goal: Respiratory complications will improve Outcome: Progressing Goal: Cardiovascular complication will be avoided Outcome: Progressing   Problem: Activity: Goal: Risk for activity intolerance will decrease Outcome:  Progressing   Problem: Nutrition: Goal: Adequate nutrition will be maintained Outcome: Progressing   Problem: Coping: Goal: Level of anxiety will decrease Outcome: Progressing   Problem: Elimination: Goal: Will not experience complications related to bowel motility Outcome: Progressing Goal: Will not experience complications related to urinary retention Outcome: Progressing   Problem: Pain Managment: Goal: General experience of comfort will improve Outcome: Progressing   Problem: Safety: Goal: Ability to remain free from injury will improve Outcome: Progressing   Problem: Skin Integrity: Goal: Risk for impaired skin integrity will decrease Outcome: Progressing   Problem: Education: Goal: Knowledge of disease or condition will improve Outcome: Progressing Goal: Knowledge of the prescribed therapeutic regimen will improve Outcome: Progressing   Problem: Activity: Goal: Risk for activity intolerance will decrease Outcome: Progressing   Problem: Cardiac: Goal: Will achieve and/or maintain hemodynamic stability Outcome: Progressing   Problem: Clinical Measurements: Goal: Postoperative complications will be avoided or minimized Outcome: Progressing   Problem: Respiratory: Goal: Respiratory status will improve Outcome: Progressing   Problem: Pain Management: Goal: Pain level will decrease Outcome: Progressing   Problem: Skin Integrity: Goal: Wound healing without signs and symptoms infection will improve Outcome: Progressing   

## 2022-07-29 NOTE — Inpatient Diabetes Management (Signed)
Inpatient Diabetes Program Recommendations  AACE/ADA: New Consensus Statement on Inpatient Glycemic Control   Target Ranges:  Prepandial:   less than 140 mg/dL      Peak postprandial:   less than 180 mg/dL (1-2 hours)      Critically ill patients:  140 - 180 mg/dL    Latest Reference Range & Units 07/28/22 07:33 07/28/22 12:29 07/28/22 16:25 07/28/22 20:57 07/29/22 07:54 07/29/22 11:30  Glucose-Capillary 70 - 99 mg/dL 626 (H) 948 (H) 546 (H) 105 (H) 230 (H) 332 (H)   Review of Glycemic Control  Current orders for Inpatient glycemic control: Levemir 35 units BID, Novolog 15 units TID with meals, Novolog 0-24 units AC&HS  Inpatient Diabetes Program Recommendations:    Insulin: Please consider increasing Levemir to 38 units BID and meal coverage to Novolog 17 units TID with meals.  Thanks, Orlando Penner, RN, MSN, CDCES Diabetes Coordinator Inpatient Diabetes Program [phone] (Team Pager from 8am to 5pm)

## 2022-07-30 ENCOUNTER — Inpatient Hospital Stay (HOSPITAL_COMMUNITY): Payer: Self-pay

## 2022-07-30 ENCOUNTER — Encounter (HOSPITAL_COMMUNITY): Payer: Self-pay | Admitting: Cardiothoracic Surgery

## 2022-07-30 ENCOUNTER — Other Ambulatory Visit: Payer: Self-pay

## 2022-07-30 ENCOUNTER — Inpatient Hospital Stay (HOSPITAL_COMMUNITY): Payer: Medicare Other

## 2022-07-30 ENCOUNTER — Encounter (HOSPITAL_COMMUNITY)
Admission: EM | Disposition: A | Discharge status: Home Health | Payer: Self-pay | Source: Home / Self Care | Attending: Cardiothoracic Surgery

## 2022-07-30 DIAGNOSIS — I509 Heart failure, unspecified: Secondary | ICD-10-CM | POA: Diagnosis not present

## 2022-07-30 DIAGNOSIS — Z87891 Personal history of nicotine dependence: Secondary | ICD-10-CM

## 2022-07-30 DIAGNOSIS — I110 Hypertensive heart disease with heart failure: Secondary | ICD-10-CM

## 2022-07-30 DIAGNOSIS — I2510 Atherosclerotic heart disease of native coronary artery without angina pectoris: Secondary | ICD-10-CM

## 2022-07-30 DIAGNOSIS — M009 Pyogenic arthritis, unspecified: Secondary | ICD-10-CM

## 2022-07-30 DIAGNOSIS — M86612 Other chronic osteomyelitis, left shoulder: Secondary | ICD-10-CM | POA: Diagnosis not present

## 2022-07-30 HISTORY — PX: STERNAL WOUND DEBRIDEMENT: SHX1058

## 2022-07-30 LAB — GLUCOSE, CAPILLARY
Glucose-Capillary: 234 mg/dL — ABNORMAL HIGH (ref 70–99)
Glucose-Capillary: 212 mg/dL — ABNORMAL HIGH (ref 70–99)
Glucose-Capillary: 179 mg/dL — ABNORMAL HIGH (ref 70–99)
Glucose-Capillary: 155 mg/dL — ABNORMAL HIGH (ref 70–99)
Glucose-Capillary: 174 mg/dL — ABNORMAL HIGH (ref 70–99)
Glucose-Capillary: 319 mg/dL — ABNORMAL HIGH (ref 70–99)

## 2022-07-30 SURGERY — DEBRIDEMENT, WOUND, STERNUM
Anesthesia: Monitor Anesthesia Care | Laterality: Left

## 2022-07-30 MED ORDER — PHENYLEPHRINE 80 MCG/ML (10ML) SYRINGE FOR IV PUSH (FOR BLOOD PRESSURE SUPPORT)
PREFILLED_SYRINGE | INTRAVENOUS | Status: DC | PRN
  Administered 2022-07-30 (×2): 160 ug via INTRAVENOUS

## 2022-07-30 MED ORDER — PROPOFOL 500 MG/50ML IV EMUL
INTRAVENOUS | Status: DC | PRN
  Administered 2022-07-30: 120 ug/kg/min via INTRAVENOUS

## 2022-07-30 MED ORDER — CHLORHEXIDINE GLUCONATE 0.12 % MT SOLN: 15.0000 mL | OROMUCOSAL | Status: DC

## 2022-07-30 MED ORDER — CEFAZOLIN SODIUM-DEXTROSE 2-4 GM/100ML-% IV SOLN
2.0000 g | Freq: Once | INTRAVENOUS | Status: AC
  Administered 2022-07-31: 2 g via INTRAVENOUS
  Filled 2022-07-30: qty 100

## 2022-07-30 MED ORDER — ACETAMINOPHEN 160 MG/5ML PO SOLN: 325.0000 mg | Freq: Once | ORAL | Status: DC | PRN

## 2022-07-30 MED ORDER — INSULIN DETEMIR 100 UNIT/ML ~~LOC~~ SOLN
38.0000 [IU] | Freq: Two times a day (BID) | SUBCUTANEOUS | Status: DC
  Administered 2022-07-30 – 2022-07-31 (×2): 38 [IU] via SUBCUTANEOUS
  Filled 2022-07-30 (×3): qty 0.38

## 2022-07-30 MED ORDER — VASHE WOUND IRRIGATION OPTIME
TOPICAL | Status: DC | PRN
  Administered 2022-07-30: 34 [oz_av]

## 2022-07-30 MED ORDER — LACTATED RINGERS IV SOLN: INTRAVENOUS | Status: DC

## 2022-07-30 MED ORDER — SODIUM CHLORIDE 0.9 % IR SOLN
Status: DC | PRN
  Administered 2022-07-30: 1000 mL

## 2022-07-30 MED ORDER — FENTANYL CITRATE (PF) 100 MCG/2ML IJ SOLN: 25.0000 ug | INTRAMUSCULAR | Status: DC | PRN

## 2022-07-30 MED ORDER — FENTANYL CITRATE (PF) 250 MCG/5ML IJ SOLN
INTRAMUSCULAR | Status: AC
  Filled 2022-07-30: qty 5

## 2022-07-30 MED ORDER — ONDANSETRON HCL 4 MG/2ML IJ SOLN
INTRAMUSCULAR | Status: DC | PRN
  Administered 2022-07-30: 4 mg via INTRAVENOUS

## 2022-07-30 MED ORDER — PROPOFOL 10 MG/ML IV BOLUS
INTRAVENOUS | Status: AC
  Filled 2022-07-30: qty 20

## 2022-07-30 MED ORDER — INSULIN ASPART 100 UNIT/ML IJ SOLN
17.0000 [IU] | Freq: Three times a day (TID) | INTRAMUSCULAR | Status: DC
  Administered 2022-07-31: 17 [IU] via SUBCUTANEOUS

## 2022-07-30 MED ORDER — PROMETHAZINE HCL 25 MG/ML IJ SOLN: 6.2500 mg | INTRAMUSCULAR | Status: DC | PRN

## 2022-07-30 MED ORDER — AMISULPRIDE (ANTIEMETIC) 5 MG/2ML IV SOLN: 10.0000 mg | Freq: Once | INTRAVENOUS | Status: DC | PRN

## 2022-07-30 MED ORDER — MEPERIDINE HCL 25 MG/ML IJ SOLN: 6.2500 mg | INTRAMUSCULAR | Status: DC | PRN

## 2022-07-30 MED ORDER — STERILE WATER FOR IRRIGATION IR SOLN
Status: DC | PRN
  Administered 2022-07-30: 1000 mL

## 2022-07-30 MED ORDER — ACETAMINOPHEN 10 MG/ML IV SOLN: 1000.0000 mg | Freq: Once | INTRAVENOUS | Status: DC | PRN

## 2022-07-30 MED ORDER — FENTANYL CITRATE (PF) 250 MCG/5ML IJ SOLN
INTRAMUSCULAR | Status: DC | PRN
  Administered 2022-07-30: 50 ug via INTRAVENOUS

## 2022-07-30 MED ORDER — CEFADROXIL 500 MG PO CAPS
500.0000 mg | ORAL_CAPSULE | Freq: Two times a day (BID) | ORAL | Status: DC
  Administered 2022-07-31: 500 mg via ORAL
  Filled 2022-07-30 (×2): qty 1

## 2022-07-30 MED ORDER — CHLORHEXIDINE GLUCONATE 0.12 % MT SOLN
15.0000 mL | OROMUCOSAL | Status: AC
  Administered 2022-07-30: 15 mL via OROMUCOSAL
  Filled 2022-07-30: qty 15

## 2022-07-30 MED ORDER — ACETAMINOPHEN 325 MG PO TABS: 325.0000 mg | ORAL_TABLET | Freq: Once | ORAL | Status: DC | PRN

## 2022-07-30 MED ORDER — INSULIN ASPART 100 UNIT/ML IJ SOLN: 0.0000 [IU] | INTRAMUSCULAR | Status: DC | PRN

## 2022-07-30 SURGICAL SUPPLY — 76 items
APL SKNCLS STERI-STRIP NONHPOA (GAUZE/BANDAGES/DRESSINGS)
ATTRACTOMAT 16X20 MAGNETIC DRP (DRAPES) ×1 IMPLANT
BAG DECANTER FOR FLEXI CONT (MISCELLANEOUS) ×1 IMPLANT
BENZOIN TINCTURE PRP APPL 2/3 (GAUZE/BANDAGES/DRESSINGS) IMPLANT
BLADE CLIPPER SURG (BLADE) ×1 IMPLANT
BLADE SURG 10 STRL SS (BLADE) ×1 IMPLANT
BLADE SURG 15 STRL LF DISP TIS (BLADE) IMPLANT
BLADE SURG 15 STRL SS (BLADE)
BNDG GAUZE DERMACEA FLUFF 4 (GAUZE/BANDAGES/DRESSINGS) IMPLANT
BNDG GZE DERMACEA 4 6PLY (GAUZE/BANDAGES/DRESSINGS)
CANISTER SUCT 3000ML PPV (MISCELLANEOUS) ×1 IMPLANT
CANISTER WOUND CARE 500ML ATS (WOUND CARE) ×1 IMPLANT
CLEANSER WND VASHE 34 (WOUND CARE) IMPLANT
CLIP TI WIDE RED SMALL 24 (CLIP) IMPLANT
CNTNR URN SCR LID CUP LEK RST (MISCELLANEOUS) IMPLANT
CONN Y 3/8X3/8X3/8 BEN (MISCELLANEOUS) IMPLANT
CONT SPEC 4OZ STRL OR WHT (MISCELLANEOUS)
CONTAINER PROTECT SURGISLUSH (MISCELLANEOUS) ×2 IMPLANT
COVER SURGICAL LIGHT HANDLE (MISCELLANEOUS) ×2 IMPLANT
DRAPE CHEST BREAST 15X10 FENES (DRAPES) IMPLANT
DRAPE LAPAROSCOPIC ABDOMINAL (DRAPES) ×1 IMPLANT
DRAPE SLUSH/WARMER DISC (DRAPES) IMPLANT
DRAPE WARM FLUID 44X44 (DRAPES) IMPLANT
DRESSING MEPILEX FLEX 4X4 (GAUZE/BANDAGES/DRESSINGS) IMPLANT
DRSG AQUACEL AG ADV 3.5X14 (GAUZE/BANDAGES/DRESSINGS) ×1 IMPLANT
DRSG CUTIMED SORBACT 7X9 (GAUZE/BANDAGES/DRESSINGS) IMPLANT
DRSG MEPILEX FLEX 4X4 (GAUZE/BANDAGES/DRESSINGS) ×1
DRSG VAC GRANUFOAM LG (GAUZE/BANDAGES/DRESSINGS) ×1 IMPLANT
DRSG VAC GRANUFOAM MED (GAUZE/BANDAGES/DRESSINGS) ×1 IMPLANT
DRSG VAC GRANUFOAM SM (GAUZE/BANDAGES/DRESSINGS) ×1 IMPLANT
ELECT REM PT RETURN 9FT ADLT (ELECTROSURGICAL)
ELECTRODE REM PT RTRN 9FT ADLT (ELECTROSURGICAL) ×1 IMPLANT
GAUZE 4X4 16PLY ~~LOC~~+RFID DBL (SPONGE) ×1 IMPLANT
GAUZE PAD ABD 8X10 STRL (GAUZE/BANDAGES/DRESSINGS) IMPLANT
GAUZE SPONGE 4X4 12PLY STRL (GAUZE/BANDAGES/DRESSINGS) ×1 IMPLANT
GAUZE SPONGE 4X4 12PLY STRL LF (GAUZE/BANDAGES/DRESSINGS) IMPLANT
GAUZE XEROFORM 5X9 LF (GAUZE/BANDAGES/DRESSINGS) IMPLANT
GFT MATRIX 2 LAYER 5X5 (Graft) ×1 IMPLANT
GLOVE BIO SURGEON STRL SZ7.5 (GLOVE) ×2 IMPLANT
GOWN STRL REUS W/ TWL LRG LVL3 (GOWN DISPOSABLE) ×4 IMPLANT
GOWN STRL REUS W/TWL LRG LVL3 (GOWN DISPOSABLE) ×2
GRAFT MATRIX 2 LAYER 5X5 (Graft) IMPLANT
HANDPIECE INTERPULSE COAX TIP (DISPOSABLE)
HEMOSTAT POWDER SURGIFOAM 1G (HEMOSTASIS) IMPLANT
HEMOSTAT SURGICEL 2X14 (HEMOSTASIS) IMPLANT
KIT BASIN OR (CUSTOM PROCEDURE TRAY) ×1 IMPLANT
KIT SUCTION CATH 14FR (SUCTIONS) IMPLANT
KIT TURNOVER KIT B (KITS) ×1 IMPLANT
NS IRRIG 1000ML POUR BTL (IV SOLUTION) ×1 IMPLANT
PACK CHEST (CUSTOM PROCEDURE TRAY) ×1 IMPLANT
PACK GENERAL/GYN (CUSTOM PROCEDURE TRAY) ×1 IMPLANT
PAD ARMBOARD 7.5X6 YLW CONV (MISCELLANEOUS) ×2 IMPLANT
POWDER MYRIAD MORCLLS FINE 500 (Miscellaneous) IMPLANT
PWDR MYRIAD MORCELLS FINE 500 (Miscellaneous) ×1 IMPLANT
SET HNDPC FAN SPRY TIP SCT (DISPOSABLE) ×1 IMPLANT
SOL PREP POV-IOD 4OZ 10% (MISCELLANEOUS) IMPLANT
SPONGE T-LAP 18X18 ~~LOC~~+RFID (SPONGE) ×5 IMPLANT
SPONGE T-LAP 4X18 ~~LOC~~+RFID (SPONGE) ×1 IMPLANT
STAPLER VISISTAT 35W (STAPLE) IMPLANT
SURGILUBE 2OZ TUBE FLIPTOP (MISCELLANEOUS) IMPLANT
SUT ETHILON 3 0 FSL (SUTURE) IMPLANT
SUT STEEL 6MS V (SUTURE) IMPLANT
SUT STEEL STERNAL CCS#1 18IN (SUTURE) IMPLANT
SUT STEEL SZ 6 DBL 3X14 BALL (SUTURE) IMPLANT
SUT VIC AB 1 CTX 36 (SUTURE)
SUT VIC AB 1 CTX36XBRD ANBCTR (SUTURE) ×2 IMPLANT
SUT VIC AB 2-0 CTX 27 (SUTURE) ×2 IMPLANT
SUT VIC AB 3-0 SH 8-18 (SUTURE) IMPLANT
SUT VIC AB 3-0 X1 27 (SUTURE) ×2 IMPLANT
SWAB COLLECTION DEVICE MRSA (MISCELLANEOUS) IMPLANT
SWAB CULTURE ESWAB REG 1ML (MISCELLANEOUS) IMPLANT
SYR 5ML LL (SYRINGE) IMPLANT
TOWEL GREEN STERILE (TOWEL DISPOSABLE) ×1 IMPLANT
TOWEL GREEN STERILE FF (TOWEL DISPOSABLE) ×1 IMPLANT
TRAY FOLEY MTR SLVR 16FR STAT (SET/KITS/TRAYS/PACK) IMPLANT
WATER STERILE IRR 1000ML POUR (IV SOLUTION) ×1 IMPLANT

## 2022-07-30 NOTE — Plan of Care (Signed)

## 2022-07-30 NOTE — Op Note (Unsigned)
NAMEYEHONATAN, GRANDISON MEDICAL RECORD NO: [id number] ACCOUNT NO: [id number] DATE OF BIRTH: 09/08/1945 FACILITY: [facility] LOCATION: [facility] PHYSICIAN: Kerin Perna III, MD  Operative Report   DATE OF PROCEDURE: 07/30/2022  OPERATION:  Washout of left sternoclavicular joint abscess with application of tissue matrix Myriad 500 grams powder and removal of wound VAC system.  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  SURGEON:  Kathlee Nations Trigt III, MD  ANESTHESIA:  MAC.  IV conscious sedation, monitored by anesthesia.  DESCRIPTION OF PROCEDURE:  The patient was brought from preoperative holding where informed consent was documented and final issues regarding the procedure were directly discussed with the patient face-to-face.  The patient was then brought back to the  operating room by anesthesia and nursing and placed supine on the operating table.  The patient was positioned and started on IV monitored conscious sedation.  The previously placed wound VAC sheets and sponge were removed.  The chest was prepped and  draped as a sterile field.  A proper timeout was performed.  The wound was inspected.  The wound was clean without purulence.  The previously placed tissue matrix Myriad product was still in place.  The wound measured 3 cm long x 3 cm deep x 2 cm wide.  The wound was irrigated with Vashe wound irrigation.  The  Sorbact sheath was removed and additional Myriad tissue matrix product was applied.  New Sorbact sheath was tacked to the chest wall tissues with interrupted Vicryl sutures above the tissue matrix.  We then placed wet-to-dry gauze packing into the wound  over the Sorbact screen with Vashe solution and a sterile dressing was applied.  The patient was reversed from anesthesia and returned to recovery  room in stable condition.   VAI D: 07/30/2022 7:21:58 pm T: 07/30/2022 7:52:00 pm  JOB: [id number]/ [id number]

## 2022-07-30 NOTE — Inpatient Diabetes Management (Signed)
Inpatient Diabetes Program Recommendations  AACE/ADA: New Consensus Statement on Inpatient Glycemic Control  Target Ranges:  Prepandial:   less than 140 mg/dL      Peak postprandial:   less than 180 mg/dL (1-2 hours)      Critically ill patients:  140 - 180 mg/dL    Latest Reference Range & Units 07/29/22 07:54 07/29/22 11:30 07/29/22 16:19 07/29/22 21:35 07/30/22 07:48  Glucose-Capillary 70 - 99 mg/dL 098 (H) 119 (H) 147 (H) 94 234 (H)   Review of Glycemic Control  Current orders for Inpatient glycemic control: Levemir 35 units BID, Novolog 15 units TID with meals, Novolog 0-24 units AC&HS   Inpatient Diabetes Program Recommendations:     Insulin: Please consider increasing Levemir to 38 units BID and meal coverage to Novolog 17 units TID with meals  Thanks, Orlando Penner, RN, MSN, CDCES Diabetes Coordinator Inpatient Diabetes Program [phone] (Team Pager from 8am to 5pm)

## 2022-07-30 NOTE — Anesthesia Preprocedure Evaluation (Addendum)
Anesthesia Evaluation  Patient identified by MRN, date of birth, ID band Patient awake    Reviewed: Allergy & Precautions, NPO status , Patient's Chart, lab work & pertinent test results, reviewed documented beta blocker date and time   History of Anesthesia Complications Negative for: history of anesthetic complications  Airway Mallampati: II  TM Distance: >3 FB Neck ROM: Full    Dental  (+) Dental Advisory Given   Pulmonary former smoker   Pulmonary exam normal        Cardiovascular hypertension, Pt. on medications and Pt. on home beta blockers + CAD, + Past MI, + CABG and +CHF  Normal cardiovascular exam+ Valvular Problems/Murmurs AI    '24 TEE - EF 45 to 50%. Global hypokinesis. Mild mitral valve regurgitation. Eccentric anteriorly directed aortic regurgitation. Aortic valve regurgitation is mild to moderate.     Neuro/Psych negative neurological ROS  negative psych ROS   GI/Hepatic Neg liver ROS,GERD  Controlled,,  Endo/Other  diabetes, Type 2, Insulin Dependent    Renal/GU negative Renal ROS    Prostate cancer     Musculoskeletal  (+) Arthritis ,    Abdominal   Peds  Hematology  (+) Blood dyscrasia, anemia   Anesthesia Other Findings Hard of hearing Claustrophobia   Reproductive/Obstetrics                              Anesthesia Physical Anesthesia Plan  ASA: 3  Anesthesia Plan: General   Post-op Pain Management: Tylenol PO (pre-op)*   Induction: Intravenous  PONV Risk Score and Plan: 2 and Treatment may vary due to age or medical condition, Ondansetron and Propofol infusion  Airway Management Planned: Oral ETT  Additional Equipment: None  Intra-op Plan:   Post-operative Plan: Extubation in OR  Informed Consent: I have reviewed the patients History and Physical, chart, labs and discussed the procedure including the risks, benefits and alternatives for the  proposed anesthesia with the patient or authorized representative who has indicated his/her understanding and acceptance.     Dental advisory given  Plan Discussed with: CRNA and Anesthesiologist  Anesthesia Plan Comments:          Anesthesia Quick Evaluation                                  Anesthesia Evaluation  Patient identified by MRN, date of birth, ID band Patient awake    Reviewed: Allergy & Precautions, NPO status , Patient's Chart, lab work & pertinent test results  Airway Mallampati: III  TM Distance: <3 FB Neck ROM: Full    Dental  (+) Edentulous Upper, Poor Dentition, Chipped   Pulmonary former smoker   breath sounds clear to auscultation       Cardiovascular hypertension, Pt. on medications + CAD, + Past MI and + CABG   Rhythm:Regular Rate:Normal  Echo:  1. Left ventricular ejection fraction, by estimation, is 50%. The left  ventricle has mildly decreased function. Left ventricular endocardial  border not optimally defined to evaluate regional wall motion. There is  moderate left ventricular hypertrophy.  Left ventricular diastolic parameters are indeterminate. Elevated left  atrial pressure.   2. Right ventricular systolic function is normal. The right ventricular  size is normal. Tricuspid regurgitation signal is inadequate for assessing  PA pressure.   3. The mitral valve is abnormal. Mild mitral valve regurgitation. No  evidence of  mitral stenosis.   4. The aortic valve is tricuspid. There is mild calcification of the  aortic valve. There is mild thickening of the aortic valve. Aortic valve  regurgitation is trivial. No aortic stenosis is present.   5. Aortic dilatation noted. There is mild dilatation of the aortic root,  measuring 42 mm. There is mild dilatation of the ascending aorta,  measuring 40 mm.     Neuro/Psych    GI/Hepatic ,GERD  ,,  Endo/Other  diabetes, Type 2, Insulin Dependent    Renal/GU       Musculoskeletal  (+) Arthritis ,    Abdominal   Peds  Hematology   Anesthesia Other Findings   Reproductive/Obstetrics                             Anesthesia Physical Anesthesia Plan  ASA: 3  Anesthesia Plan: MAC   Post-op Pain Management: Ofirmev IV (intra-op)*   Induction: Intravenous  PONV Risk Score and Plan: 3 and Ondansetron and Treatment may vary due to age or medical condition  Airway Management Planned:   Additional Equipment: None  Intra-op Plan:   Post-operative Plan:   Informed Consent: I have reviewed the patients History and Physical, chart, labs and discussed the procedure including the risks, benefits and alternatives for the proposed anesthesia with the patient or authorized representative who has indicated his/her understanding and acceptance.     Dental advisory given  Plan Discussed with: CRNA  Anesthesia Plan Comments:        Anesthesia Quick Evaluation

## 2022-07-30 NOTE — Progress Notes (Signed)
Pre Procedure note for inpatients:   NAJIB COLMENARES has been scheduled for Procedure(s): LEFT STERNOCLAVICULAR JOINT DEBRIDEMENT  WITH MYRIAD PLACEMENT (Left) APPLICATION OF WOUND VAC  CHANGE (Left) today. The various methods of treatment have been discussed with the patient. After consideration of the risks, benefits and treatment options the patient has consented to the planned procedure.   The patient has been seen and labs reviewed. There are no changes in the patient's condition to prevent proceeding with the planned procedure today.  Recent labs:  Lab Results  Component Value Date   WBC 4.8 07/27/2022   HGB 9.7 (L) 07/27/2022   HCT 31.0 (L) 07/27/2022   PLT 228 07/27/2022   GLUCOSE 174 (H) 07/29/2022   CHOL 144 06/06/2022   TRIG 285 (H) 06/06/2022   HDL 37 (L) 06/06/2022   LDLCALC 50 06/06/2022   ALT 11 07/25/2022   AST 11 (L) 07/25/2022   NA 133 (L) 07/29/2022   K 3.7 07/29/2022   CL 98 07/29/2022   CREATININE 0.66 07/29/2022   BUN 10 07/29/2022   CO2 26 07/29/2022   TSH 2.92 04/23/2017   INR 1.1 07/22/2022   HGBA1C 8.6 (H) 06/06/2022    Lovett Sox, MD 07/30/2022 9:49 AM

## 2022-07-30 NOTE — Brief Op Note (Signed)
07/30/2022  7:04 PM  PATIENT:  George [name]  77 y.o. male  PRE-OPERATIVE DIAGNOSIS:  [place] JOINT ABSCESS  POST-OPERATIVE DIAGNOSIS:  Sternoclavicular Joint Abscess  PROCEDURE:  Irrigation of L sternoclavicular wound Application of Myriad tissue matrix Removal of wound VAC  SURGEON:  Surgeons and Role:    Lovett Sox, MD - Primary  PHYSICIAN ASSISTANT:   ASSISTANTS: none   ANESTHESIA:   IV sedation and MAC  EBL:  none  BLOOD ADMINISTERED:none  DRAINS: none   LOCAL MEDICATIONS USED:  NONE  SPECIMEN:  No Specimen  DISPOSITION OF SPECIMEN:  N/A  COUNTS:  YES  TOURNIQUET: none  DICTATION: .Dragon Dictation  PLAN OF CARE:  return to 6E   PATIENT DISPOSITION:  PACU - hemodynamically stable.   Delay start of Pharmacological VTE agent (>24hrs) due to surgical blood loss or risk of bleeding: yes

## 2022-07-31 ENCOUNTER — Other Ambulatory Visit: Payer: Self-pay | Admitting: Physician Assistant

## 2022-07-31 ENCOUNTER — Telehealth: Payer: Self-pay

## 2022-07-31 ENCOUNTER — Other Ambulatory Visit (HOSPITAL_COMMUNITY): Payer: Self-pay

## 2022-07-31 ENCOUNTER — Ambulatory Visit: Payer: Medicare Other | Admitting: Family

## 2022-07-31 LAB — GLUCOSE, CAPILLARY
Glucose-Capillary: 203 mg/dL — ABNORMAL HIGH (ref 70–99)
Glucose-Capillary: 116 mg/dL — ABNORMAL HIGH (ref 70–99)

## 2022-07-31 MED ORDER — CEFADROXIL 500 MG PO CAPS: 500.0000 mg | ORAL_CAPSULE | Freq: Two times a day (BID) | ORAL | 0 refills | Status: DC

## 2022-07-31 MED ORDER — CEFADROXIL 500 MG PO CAPS
1000.0000 mg | ORAL_CAPSULE | Freq: Two times a day (BID) | ORAL | 0 refills | Status: DC
  Filled 2022-07-31: qty 82, 21d supply, fill #0

## 2022-07-31 MED ORDER — CEFADROXIL 500 MG PO CAPS: 1000.0000 mg | ORAL_CAPSULE | Freq: Two times a day (BID) | ORAL | 0 refills | Status: DC

## 2022-07-31 MED ORDER — OXYCODONE-ACETAMINOPHEN 5-325 MG PO TABS: 1.0000 | ORAL_TABLET | Freq: Four times a day (QID) | ORAL | 0 refills | Status: DC | PRN

## 2022-07-31 MED ORDER — CEFADROXIL 500 MG PO CAPS
500.0000 mg | ORAL_CAPSULE | Freq: Once | ORAL | Status: AC
  Administered 2022-07-31: 500 mg via ORAL
  Filled 2022-07-31: qty 1

## 2022-07-31 NOTE — Progress Notes (Signed)
Patients sister reported that patient's wife is unable to perform dressing changes, but his daughter in law, Kenney Houseman, is a Engineer, civil (consulting) and would be able to change dressing daily. Discussed wound care instruction over the phone with Kenney Houseman who verbalized understanding and is agreeable. Patient wife confirmed she has wound care supplies, TOC delivered antibiotics, patient confirmed he has all of his belongings. Volunteers took patient to the exit, his sister provided transportation.

## 2022-07-31 NOTE — Telephone Encounter (Addendum)
Called patient to attempt to reschedule appointment. Patient was discharged today from hospital. Spoke with wife who stated that Dr. Alla German pulled picc line and started patient on oral antibiotics and he was following up with antibiotics. She also stated that patient will see Dr. Maren Beach once or twice a week. Patient wife will clarify with Dr. Maren Beach if patient needs to follow up with ID.

## 2022-07-31 NOTE — Progress Notes (Addendum)
      [address]       [place] [address]             [phone]        1 Day Post-Op Procedure(s) (LRB): LEFT STERNOCLAVICULAR JOINT DEBRIDEMENT (Left)  Subjective: Patient sleeping and briefly awakened. He states I think I am going home today  Objective: Vital signs in last 24 hours: Temp:  [97.9 F (36.6 C)-99.1 F (37.3 C)] 98.6 F (37 C) (07/17 0435) Pulse Rate:  [73-95] 94 (07/17 0435) Cardiac Rhythm: Normal sinus rhythm (07/16 2007) Resp:  [15-20] 15 (07/17 0435) BP: (118-173)/(68-118) 140/81 (07/17 0435) SpO2:  [95 %-99 %] 98 % (07/16 2044) Weight:  [95 kg] 95 kg (07/16 1513)  Current Weight  07/30/22 95 kg      Intake/Output from previous day: 07/16 0701 - 07/17 0700 In: 717.1 [P.O.:240; I.V.:277.1; IV Piggyback:200] Out: 2025 [Urine:2025]   Physical Exam:  Cardiovascular: RRR Pulmonary: Clear to auscultation bilaterally Wound: Dressing intact;mostly dry  Lab Results: CBC: No results for input(s): "WBC", "HGB", "HCT", "PLT" in the last 72 hours.  BMET:  Recent Labs    07/29/22 0307  NA 133*  K 3.7  CL 98  CO2 26  GLUCOSE 174*  BUN 10  CREATININE 0.66  CALCIUM 8.7*    PT/INR:  Lab Results  Component Value Date   INR 1.1 07/22/2022   INR 1.1 06/27/2022   INR 0.97 11/18/2017   ABG:  INR: Will add last result for INR, ABG once components are confirmed Will add last 4 CBG results once components are confirmed  Assessment/Plan:  1. CV - SR, first degree heart block. On Coreg 12.5 mg bid and Lisinopril 10 mg daily.  2.  Pulmonary - On room air.  3. ID-Now on Cefazolin for MSSA. Per infectious disease note on prior admission, last dose is today.  As discussed with Dr. Donata Clay, will remove PICC line . Patient will then be started on Cefadroxil orally. 4.  Expected post op acute blood loss anemia - H and H this am stable at 9.7 and 31. 5. DM-CBGs 155/174/319. On Insulin 38 units bid and on meal coverage 17 units  tid. HGA1C 8.6. He will need close medical follow up after discharge 6. On Lovenox for DVT prophylaxis 7. Discharge after Dr. Donata Clay changes his dressing today. He has ordered home health to assist with dressing changes. Will arrange for follow up on Monday 08/05/2022.  Donielle M ZimmermanPA-C 7:05 AM 07/31/2022  Surgical wound personally performed for daily dressing change; his wife will not be ready to perform this and will need assistance with [facility], family.  I removed the entire PICC line with sterile technique and applied pressure dressing  Daily wound care at Home:  Use exam gloves provided Remove outer dressing and clean skin with Vashe  wound solution using 4x4 Remove the packing from the wound ( 4x4 soaked in Vashe) Apply fresh 4x4 soaked in Vashe to the base of the wound and push some gauze superiorly to fill a 2 cm space under the skin Cover with dry gauze Secure with paper tape or Mepelix Pad   patient examined and medical record reviewed,agree with above note. Lovett Sox 07/31/2022

## 2022-07-31 NOTE — Progress Notes (Signed)
PICC line removed by the dr per Durwin Nora RN

## 2022-07-31 NOTE — Progress Notes (Signed)
My colleague Gershon Crane was asked by patient's wife, why there was no pain medication ordered for discharge.  When I was reconciling his discharge medications, I noticed, per PDMP, patient received Percocet 5/325 mg #28 from Dr. Sherryll Burger on 07/20/2022. Patient was admitted on 07/22/2022 and discharged today. Per wife, patient has no Percocet left from that prescription. She asked that I call in a refill of Percocet to [facility] and she will pick this up tomorrow. I offered to send to a pharmacy that is open this evening, but she said she preferred [facility]. I also told her the pharmacy may not fill prescription as was just filled. She also stated nurse is coming out to home tomorrow and she does not need to bring her husband to the office for a dressing change.

## 2022-07-31 NOTE — Plan of Care (Signed)
  Problem: Education: Goal: Ability to describe self-care measures that may prevent or decrease complications (Diabetes Survival Skills Education) will improve Outcome: Progressing Goal: Individualized Educational Video(s) Outcome: Progressing   Problem: Coping: Goal: Ability to adjust to condition or change in health will improve Outcome: Progressing   Problem: Health Behavior/Discharge Planning: Goal: Ability to identify and utilize available resources and services will improve Outcome: Progressing Goal: Ability to manage health-related needs will improve Outcome: Progressing   Problem: Metabolic: Goal: Ability to maintain appropriate glucose levels will improve Outcome: Progressing   Problem: Nutritional: Goal: Maintenance of adequate nutrition will improve Outcome: Progressing Goal: Progress toward achieving an optimal weight will improve Outcome: Progressing   Problem: Skin Integrity: Goal: Risk for impaired skin integrity will decrease Outcome: Progressing   Problem: Tissue Perfusion: Goal: Adequacy of tissue perfusion will improve Outcome: Progressing   Problem: Education: Goal: Knowledge of General Education information will improve Description: Including pain rating scale, medication(s)/side effects and non-pharmacologic comfort measures Outcome: Progressing   Problem: Health Behavior/Discharge Planning: Goal: Ability to manage health-related needs will improve Outcome: Progressing   Problem: Clinical Measurements: Goal: Ability to maintain clinical measurements within normal limits will improve Outcome: Progressing Goal: Will remain free from infection Outcome: Progressing Goal: Diagnostic test results will improve Outcome: Progressing Goal: Respiratory complications will improve Outcome: Progressing Goal: Cardiovascular complication will be avoided Outcome: Progressing   Problem: Activity: Goal: Risk for activity intolerance will decrease Outcome:  Progressing   Problem: Nutrition: Goal: Adequate nutrition will be maintained Outcome: Progressing   Problem: Coping: Goal: Level of anxiety will decrease Outcome: Progressing   Problem: Elimination: Goal: Will not experience complications related to bowel motility Outcome: Progressing Goal: Will not experience complications related to urinary retention Outcome: Progressing   Problem: Pain Managment: Goal: General experience of comfort will improve Outcome: Progressing   Problem: Safety: Goal: Ability to remain free from injury will improve Outcome: Progressing   Problem: Skin Integrity: Goal: Risk for impaired skin integrity will decrease Outcome: Progressing   Problem: Education: Goal: Knowledge of disease or condition will improve Outcome: Progressing Goal: Knowledge of the prescribed therapeutic regimen will improve Outcome: Progressing   Problem: Activity: Goal: Risk for activity intolerance will decrease Outcome: Progressing   Problem: Cardiac: Goal: Will achieve and/or maintain hemodynamic stability Outcome: Progressing   Problem: Clinical Measurements: Goal: Postoperative complications will be avoided or minimized Outcome: Progressing   Problem: Respiratory: Goal: Respiratory status will improve Outcome: Progressing   Problem: Pain Management: Goal: Pain level will decrease Outcome: Progressing   Problem: Skin Integrity: Goal: Wound healing without signs and symptoms infection will improve Outcome: Progressing   

## 2022-07-31 NOTE — Transfer of Care (Signed)
Immediate Anesthesia Transfer of Care Note  Patient: George [name]  Procedure(s) Performed: LEFT STERNOCLAVICULAR JOINT DEBRIDEMENT (Left)  Patient Location: PACU  Anesthesia Type:MAC  Level of Consciousness: awake, alert , and drowsy  Airway & Oxygen Therapy: Patient Spontanous Breathing  Post-op Assessment: Report given to RN, Post -op Vital signs reviewed and stable, and Patient moving all extremities  Post vital signs: Reviewed and stable  Last Vitals:  Vitals Value Taken Time  BP 126/63 07/31/22 0732  Temp 36.4 C 07/31/22 0730  Pulse 100 07/31/22 0730  Resp 18 07/31/22 0730  SpO2 98 % 07/30/22 2044  Vitals shown include unfiled device data.  Last Pain:  Vitals:   07/31/22 0730  TempSrc: Oral  PainSc:       Patients Stated Pain Goal: 0 (07/29/22 2215)  Complications: No notable events documented.

## 2022-07-31 NOTE — Care Management Important Message (Signed)
Important Message  Patient Details  Name: George [name] MRN: [id number] Date of Birth: [birth year]   Medicare Important Message Given:  Yes     Renie Ora 07/31/2022, 10:27 AM

## 2022-07-31 NOTE — Inpatient Diabetes Management (Signed)
Inpatient Diabetes Program Recommendations  AACE/ADA: New Consensus Statement on Inpatient Glycemic Control   Target Ranges:  Prepandial:   less than 140 mg/dL      Peak postprandial:   less than 180 mg/dL (1-2 hours)      Critically ill patients:  140 - 180 mg/dL    Latest Reference Range & Units 07/30/22 07:48 07/30/22 12:05 07/30/22 15:01 07/30/22 17:30 07/30/22 19:13 07/30/22 21:28 07/31/22 07:34  Glucose-Capillary 70 - 99 mg/dL 564 (H) 332 (H) 951 (H) 155 (H) 174 (H) 319 (H) 203 (H)   Review of Glycemic Control  Current orders for Inpatient glycemic control: Levemir 38 units BID, Novolog 17 units TID with meals, Novolog 0-24 units AC&HS   Inpatient Diabetes Program Recommendations:     Insulin: In reviewing chart, noted morning dose of Levemir was NOT given on 07/30/22. As a result CBG up to 319 mg/dl last night. Patient did receive Levemir 38 units last night and already given this morning. Would not recommend any other insulin regimen changes at this time.   Thanks, Orlando Penner, RN, MSN, CDCES Diabetes Coordinator Inpatient Diabetes Program [phone] (Team Pager from 8am to 5pm)

## 2022-07-31 NOTE — TOC Progression Note (Signed)
Transition of Care [facility]) - Progression Note    Patient Details  Name: George [name] MRN: [id number] Date of Birth: [birth year]  Transition of [facility]) CM/SW Contact  Huston Foley Jacklynn Ganong, RN Phone Number: 07/31/2022, 11:55 AM  Clinical Narrative:    Case manager was notified that patient will need Home Health RN for support of dressing changes. Referral called to [facility], Liaison with [facility]. Wife has been instructed on how to perform dressing change.    Expected Discharge Plan: Home w Home Health Services Barriers to Discharge: No Barriers Identified  Expected Discharge Plan and Services   Discharge Planning Services: CM Consult Post Acute Care Choice: Home Health Living arrangements for the past 2 months: Single Family Home Expected Discharge Date: 07/31/22               DME Arranged: N/A         HH Arranged: RN HH Agency: [facility] Date HH Agency Contacted: 07/31/22 Time HH Agency Contacted: 1045 Representative spoke with at [facility] Agency: [name]   Social Determinants of Health (SDOH) Interventions SDOH Screenings   Food Insecurity: No Food Insecurity (07/22/2022)  Housing: Patient Declined (07/22/2022)  Transportation Needs: No Transportation Needs (07/22/2022)  Utilities: Not At Risk (07/22/2022)  Alcohol Screen: Low Risk  (06/24/2022)  Depression (PHQ2-9): Low Risk  (06/26/2022)  Financial Resource Strain: Low Risk  (06/24/2022)  Physical Activity: Insufficiently Active (06/24/2022)  Social Connections: Unknown (07/04/2022)   Received from [facility]  Stress: No Stress Concern Present (06/24/2022)  Tobacco Use: Medium Risk (07/30/2022)    Readmission Risk Interventions     No data to display

## 2022-07-31 NOTE — Progress Notes (Signed)
Patient is alert and oriented x4. Had I&D on yesterday. Wound vac was removed and wet-dry dressing w/foam is in it's place on the L-chest area. Gave oxy for pain last night. Patient removed vital sign cuff and lines from himself, therefore, complete post-op vitals were unable to be obtained. Patient has wife at bedside and denied additional needs.

## 2022-07-31 NOTE — Discharge Instructions (Addendum)
Wound care for left SCV joint wound: Use exam gloves provided Remove outer dressing and clean skin with Vashe  wound solution using 4x4 Remove the packing from the wound ( 4x4 soaked in Vashe) Apply fresh 4x4 soaked in Vashe to the base of the wound and push some gauze superiorly to fill a 2 cm space under the skin Cover with dry gauze Secure with paper tape or Mepelix Pad  6. Continue Cefadroxil (Duricef)  bid for 3 weeks

## 2022-08-01 ENCOUNTER — Encounter (HOSPITAL_COMMUNITY): Payer: Self-pay | Admitting: Cardiothoracic Surgery

## 2022-08-01 ENCOUNTER — Ambulatory Visit: Payer: Medicare Other

## 2022-08-01 NOTE — Anesthesia Postprocedure Evaluation (Signed)
Anesthesia Post Note  Patient: George [name]  Procedure(s) Performed: LEFT STERNOCLAVICULAR JOINT DEBRIDEMENT (Left)     Patient location during evaluation: PACU Anesthesia Type: MAC Level of consciousness: awake and alert Pain management: pain level controlled Vital Signs Assessment: post-procedure vital signs reviewed and stable Respiratory status: spontaneous breathing, nonlabored ventilation, respiratory function stable and patient connected to nasal cannula oxygen Cardiovascular status: stable and blood pressure returned to baseline Postop Assessment: no apparent nausea or vomiting Anesthetic complications: no   No notable events documented.  Last Vitals:  Vitals:   07/31/22 0435 07/31/22 0730  BP: (!) 140/81 126/63  Pulse: 94 100  Resp: 15 18  Temp: 37 C (!) 36.4 C  SpO2:      Last Pain:  Vitals:   07/31/22 0904  TempSrc:   PainSc: Asleep                 Elijahjames Fuelling S

## 2022-08-03 DIAGNOSIS — M50322 Other cervical disc degeneration at C5-C6 level: Secondary | ICD-10-CM | POA: Diagnosis not present

## 2022-08-03 DIAGNOSIS — M869 Osteomyelitis, unspecified: Secondary | ICD-10-CM | POA: Diagnosis not present

## 2022-08-03 DIAGNOSIS — M4802 Spinal stenosis, cervical region: Secondary | ICD-10-CM | POA: Diagnosis not present

## 2022-08-03 DIAGNOSIS — E1169 Type 2 diabetes mellitus with other specified complication: Secondary | ICD-10-CM | POA: Diagnosis not present

## 2022-08-03 DIAGNOSIS — M009 Pyogenic arthritis, unspecified: Secondary | ICD-10-CM | POA: Diagnosis not present

## 2022-08-05 ENCOUNTER — Encounter: Payer: Self-pay | Admitting: *Deleted

## 2022-08-05 ENCOUNTER — Ambulatory Visit (INDEPENDENT_AMBULATORY_CARE_PROVIDER_SITE_OTHER): Payer: Medicare Other | Admitting: Cardiothoracic Surgery

## 2022-08-05 ENCOUNTER — Ambulatory Visit: Payer: Medicare Other | Admitting: Cardiothoracic Surgery

## 2022-08-05 ENCOUNTER — Ambulatory Visit: Payer: Self-pay | Admitting: *Deleted

## 2022-08-05 ENCOUNTER — Encounter: Payer: Self-pay | Admitting: Cardiothoracic Surgery

## 2022-08-05 VITALS — BP 130/80 | HR 82 | Resp 20 | Ht 77.0 in | Wt 209.0 lb

## 2022-08-05 DIAGNOSIS — Z5189 Encounter for other specified aftercare: Secondary | ICD-10-CM | POA: Insufficient documentation

## 2022-08-05 DIAGNOSIS — Z951 Presence of aortocoronary bypass graft: Secondary | ICD-10-CM

## 2022-08-05 DIAGNOSIS — Z4889 Encounter for other specified surgical aftercare: Secondary | ICD-10-CM | POA: Insufficient documentation

## 2022-08-05 NOTE — Patient Instructions (Signed)
Visit Information  Thank you for taking time to visit with me today. Please don't hesitate to contact me if I can be of assistance to you.   Following are the goals we discussed today:   Goals Addressed               This Visit's Progress     Assess Need for Social Work Involvement. (pt-stated)   On track     Care Coordination Interventions:  Interventions Today    Flowsheet Row Most Recent Value  Chronic Disease   Chronic disease during today's visit Diabetes, Hypertension (HTN), Other  [Coronary Artery Disease & Non-ST Elevated Myocardial Infarction]  General Interventions   General Interventions Discussed/Reviewed General Interventions Discussed, Labs, Vaccines, Doctor Visits, Referral to Nurse, Health Screening, Annual Foot Exam, General Interventions Reviewed, Annual Eye Exam, Lipid Profile, Durable Medical Equipment (DME), Community Resources, Communication with, Level of Care  [Encouraged]  Labs Hgb A1c every 3 months, Kidney Function  [Encouraged]  Vaccines COVID-19, Flu, Pneumonia, RSV, Shingles, Tetanus/Pertussis/Diphtheria  [Encouraged]  Doctor Visits Discussed/Reviewed Doctor Visits Discussed, Specialist, Doctor Visits Reviewed, Annual Wellness Visits, PCP  [Encouraged]  Health Screening Bone Density, Colonoscopy, Prostate  [Encouraged]  Durable Medical Equipment (DME) Bed side commode, Glucomoter, Other  [Eye Glasses]  PCP/Specialist Visits Compliance with follow-up visit  [Encouraged]  Communication with PCP/Specialists, RN  [Encouraged]  Level of Care Adult Daycare, Personal Care Services, Applications, Assisted Living  [Encouraged]  Applications Medicaid, Personal Care Services, [place]-2  [Encouraged]  Exercise Interventions   Exercise Discussed/Reviewed Exercise Discussed, Assistive device use and maintanence, Exercise Reviewed, Physical Activity, Weight Managment  [Encouraged]  Physical Activity Discussed/Reviewed Physical Activity Discussed, Home Exercise Program  (HEP), Physical Activity Reviewed, Types of exercise  [Encouraged]  Weight Management Weight loss  [Encouraged]  Education Interventions   Education Provided Provided Therapist, sports, Provided Web-based Education, Provided Education  [Encouraged]  Provided Verbal Education On Nutrition, Mental Health/Coping with Illness, When to see the doctor, Foot Care, Eye Care, Labs, Blood Sugar Monitoring, Applications, Exercise, Medication, Development worker, community, [employer] Resources  [Encouraged]  Applications Medicaid, Personal Care Services, [place]  [Encouraged]  Mental Health Interventions   Mental Health Discussed/Reviewed Mental Health Discussed, Anxiety, Depression, Grief and Loss, Mental Health Reviewed, Substance Abuse, Coping Strategies, Crisis, Other, Suicide  [Domestic Violence]  Nutrition Interventions   Nutrition Discussed/Reviewed Nutrition Discussed, Adding fruits and vegetables, Increasing proteins, Nutrition Reviewed, Decreasing fats, Decreasing salt, Fluid intake, Carbohydrate meal planning, Portion sizes, Decreasing sugar intake  [Encouraged]  Pharmacy Interventions   Pharmacy Dicussed/Reviewed Pharmacy Topics Discussed, Pharmacy Topics Reviewed, Medication Adherence, Affording Medications  [Encouraged]  Safety Interventions   Safety Discussed/Reviewed Safety Discussed, Safety Reviewed, Fall Risk, Home Safety  [Encouraged]  Home Safety Assistive Devices, Contact provider for referral to PT/OT, Need for home safety assessment, Refer for home visit, Refer for community resources, Contact home health agency  [Encouraged]  Advanced Directive Interventions   Advanced Directives Discussed/Reviewed Advanced Directives Discussed  [Encouraged]      Active Listening & Reflection Utilized.  Verbalization of Feelings Encouraged.  Emotional Support Provided. Feelings of Motivation & Hopefulness Validated. Diminished Symptoms of Anxiety & Stress Acknowledged. Caregiver Support Groups  Reviewed. Self-Enrollment in Caregiver Support Group of Interest Emphasized. Problem Solving Interventions Employed. Task-Centered Solutions Indicated.   Solution-Focused Strategies Activated. Acceptance & Commitment Therapy Performed. Cognitive Behavioral Therapy Initiated. Client-Centered Therapy Implemented. CSW Collaboration with Wife, Adeeb Konecny to Confirm Patient's Discharge from [facility] on 07/31/2022, Where He Was Being Treated for Left Sternoclavicular Joint  Debridement with Left Myriad Placement Application of Wound Vac, Returning Home to Live Independently with Wife.  CSW Collaboration with Wife, Holden Maniscalco to [facility] Skilled Nursing Services Arranged, for Wound Care & Dressing Changes, through [facility] [phone]). CSW Collaboration with Wife, Graeson Nouri to [employer] Receipt & Thoroughly Review the Following List of [employer], [employer], [employer], to [employer] & Entertain Questions:  ~ Adult Day Care Programs ~ Ship broker Providers ~ In-Home Care & Respite Agencies ~ Home Health Care Agencies ~ Family Caregivers ~ Home Care Services - Aging, Disability, & Transit Services of     [place] ~ Physiological scientist - Aging, Disability, & Transit Services of     [place] CSW Collaboration with Wife, Passenger transport manager to [employer] with [employer], Nurse, adult, Field seismologist of Interest in [place], in An Effort to Obtain Care & Supervision for Patient in The Home [employer]. CSW Collaboration with Wife, Elmond Poehlman to [employer] with CSW [phone]# [phone]), if She Has Questions, Needs Assistance, or If Additional Social Work Needs Are Identified Between Now & Our Next Scheduled Follow-Up Outreach Call.      Our next appointment is by telephone on 09/02/2022 at 2:15 pm.   Please call the care guide team at [phone] if you need to cancel  or reschedule your appointment.   If you are experiencing a Mental Health or Behavioral Health Crisis or need someone to talk to, please call the Suicide and Crisis Lifeline: 988 call the [place] National Suicide Prevention Lifeline: (514)683-0677 or TTY: 7043662206 TTY 681 758 1133) to talk to a trained counselor call 1-800-273-TALK (toll free, 24 hour hotline) go to [facility] [address] [place] [phone]) call the [facility] Crisis Line: [phone] call 911  Patient verbalizes understanding of instructions and care plan provided today and agrees to view in MyChart. Active MyChart status and patient understanding of how to access instructions and care plan via MyChart confirmed with patient.     Telephone follow up appointment with care management team member scheduled for:  09/02/2022 at 2:15 pm.   [name], BSW, MSW, LCSW  Licensed Clinical Social Worker  [facility]  Mailing [place]. [address], [place], [place] [address] Physical Address-[address] [place], [place] [address] Toll Free Main # [phone] Fax # [phone] Cell # [phone] [name]

## 2022-08-05 NOTE — Patient Outreach (Signed)
Care Coordination   Follow Up Visit Note   08/05/2022  Name: George [name] MRN: [id number] DOB: 1945/07/03  George [name] is a 77 y.o. year old male who sees Kirstie Peri, MD for primary care. I spoke with patient's wife, George [name] by phone today.  What matters to the patients health and wellness today?  Assess Need for Social Work Involvement.   Goals Addressed               This Visit's Progress     Assess Need for Social Work Involvement. (pt-stated)   On track     Care Coordination Interventions:  Interventions Today    Flowsheet Row Most Recent Value  Chronic Disease   Chronic disease during today's visit Diabetes, Hypertension (HTN), Other  [Coronary Artery Disease & Non-ST Elevated Myocardial Infarction]  General Interventions   General Interventions Discussed/Reviewed General Interventions Discussed, Labs, Vaccines, Doctor Visits, Referral to Nurse, Health Screening, Annual Foot Exam, General Interventions Reviewed, Annual Eye Exam, Lipid Profile, Durable Medical Equipment (DME), Community Resources, Communication with, Level of Care  [Encouraged]  Labs Hgb A1c every 3 months, Kidney Function  [Encouraged]  Vaccines COVID-19, Flu, Pneumonia, RSV, Shingles, Tetanus/Pertussis/Diphtheria  [Encouraged]  Doctor Visits Discussed/Reviewed Doctor Visits Discussed, Specialist, Doctor Visits Reviewed, Annual Wellness Visits, PCP  [Encouraged]  Health Screening Bone Density, Colonoscopy, Prostate  [Encouraged]  Durable Medical Equipment (DME) Bed side commode, Glucomoter, Other  [Eye Glasses]  PCP/Specialist Visits Compliance with follow-up visit  [Encouraged]  Communication with PCP/Specialists, RN  [Encouraged]  Level of Care Adult Daycare, Personal Care Services, Applications, Assisted Living  [Encouraged]  Applications Medicaid, Personal Care Services, [place]  [Encouraged]  Exercise Interventions   Exercise Discussed/Reviewed Exercise Discussed, Assistive device use and  maintanence, Exercise Reviewed, Physical Activity, Weight Managment  [Encouraged]  Physical Activity Discussed/Reviewed Physical Activity Discussed, Home Exercise Program (HEP), Physical Activity Reviewed, Types of exercise  [Encouraged]  Weight Management Weight loss  [Encouraged]  Education Interventions   Education Provided Provided Therapist, sports, Provided Web-based Education, Provided Education  [Encouraged]  Provided Verbal Education On Nutrition, Mental Health/Coping with Illness, When to see the doctor, Foot Care, Eye Care, Labs, Blood Sugar Monitoring, Applications, Exercise, Medication, Development worker, community, [employer] Resources  [Encouraged]  Applications Medicaid, Personal Care Services, [place]  [Encouraged]  Mental Health Interventions   Mental Health Discussed/Reviewed Mental Health Discussed, Anxiety, Depression, Grief and Loss, Mental Health Reviewed, Substance Abuse, Coping Strategies, Crisis, Other, Suicide  [Domestic Violence]  Nutrition Interventions   Nutrition Discussed/Reviewed Nutrition Discussed, Adding fruits and vegetables, Increasing proteins, Nutrition Reviewed, Decreasing fats, Decreasing salt, Fluid intake, Carbohydrate meal planning, Portion sizes, Decreasing sugar intake  [Encouraged]  Pharmacy Interventions   Pharmacy Dicussed/Reviewed Pharmacy Topics Discussed, Pharmacy Topics Reviewed, Medication Adherence, Affording Medications  [Encouraged]  Safety Interventions   Safety Discussed/Reviewed Safety Discussed, Safety Reviewed, Fall Risk, Home Safety  [Encouraged]  Home Safety Assistive Devices, Contact provider for referral to PT/OT, Need for home safety assessment, Refer for home visit, Refer for community resources, Contact home health agency  [Encouraged]  Advanced Directive Interventions   Advanced Directives Discussed/Reviewed Advanced Directives Discussed  [Encouraged]      Active Listening & Reflection Utilized.  Verbalization of Feelings Encouraged.   Emotional Support Provided. Feelings of Motivation & Hopefulness Validated. Diminished Symptoms of Anxiety & Stress Acknowledged. Caregiver Support Groups Reviewed. Self-Enrollment in Caregiver Support Group of Interest Emphasized. Problem Solving Interventions Employed. Task-Centered Solutions Indicated.   Solution-Focused Strategies Activated. Acceptance & Commitment Therapy Performed. Cognitive Behavioral  Therapy Initiated. Client-Centered Therapy Implemented. CSW Collaboration with Wife, George [name] to Confirm Patient's Discharge from [facility] on 07/31/2022, Where He Was Being Treated for Left Sternoclavicular Joint Debridement with Left Myriad Placement Application of Wound Vac, Returning Home to Live Independently with Wife.  CSW Collaboration with Wife, George [name] to [facility] Skilled Nursing Services Arranged, for Wound Care & Dressing Changes, through [facility] [phone]). CSW Collaboration with Wife, George [name] to [employer] Receipt & Thoroughly Review the Following List of George [name], George [name], George [name], to George [name] & Entertain Questions:  ~ Adult Day Care Programs ~ Ship broker Providers ~ In-Home Care & Respite Agencies ~ Home Health Care Agencies ~ Family Caregivers ~ Home Care Services - Aging, Disability, & Transit Services of [place] ~ Physiological scientist - Aging, Disability, & Transit Services of     [place] CSW Collaboration with Wife, Passenger transport manager to [employer] with George [name], Nurse, adult, Field seismologist of Interest in [place], in An Effort to Obtain Care & Supervision for Patient in The Home [employer]. CSW Collaboration with Wife, George [name] to [employer] with CSW [phone]# [phone]), if She Has Questions, Needs Assistance, or If Additional Social Work Needs Are Identified Between Now & Our Next Scheduled Follow-Up  Outreach Call.      SDOH assessments and interventions completed:  Yes.  Care Coordination Interventions:  Yes, provided.   Follow up plan: Follow up call scheduled for 09/02/2022 at 2:15 pm.   Encounter Outcome:  Pt. Visit Completed.   [name], BSW, MSW, LCSW  Licensed Restaurant manager, fast food [facility]  Mailing [place] [address], [place], [place] [address] Physical Address-[address] [place], [place] [address] Toll Free Main # [phone] Fax # [phone] Cell # [phone] [name]

## 2022-08-05 NOTE — Progress Notes (Signed)
HPI: Patient returns for scheduled office visit for wound check and wound repacking.  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  He is taking Duricef 1 g twice daily and packing wound with Vashe wet-to-dry wound solution. His pain is improved.  He denies fever.  He is getting stronger. Blood sugars are better controlled, last blood sugar 150   Current Outpatient Medications  Medication Sig Dispense Refill   acetaminophen (TYLENOL) 500 MG tablet Take 1,000 mg by mouth every 6 (six) hours as needed for moderate pain.     amLODipine (NORVASC) 10 MG tablet Take 10 mg by mouth daily.     atorvastatin (LIPITOR) 80 MG tablet Take 1 tablet (80 mg total) by mouth daily. 30 tablet 3   carvedilol (COREG) 12.5 MG tablet Take 1 tablet (12.5 mg total) by mouth 2 (two) times daily. 30 tablet 3   cefadroxil (DURICEF) 500 MG capsule Take 2 capsules (1,000 mg total) by mouth 2 (two) times daily. Please take until finished 82 capsule 0   insulin glargine (LANTUS) 100 UNIT/ML Solostar Pen Inject 70-75 Units into the skin at bedtime.     lisinopril (ZESTRIL) 10 MG tablet Take 1 tablet (10 mg total) by mouth daily. 30 tablet 3   Multiple Vitamins-Minerals (PRESERVISION AREDS 2) CAPS Take 1 capsule by mouth 2 (two) times daily.     nitroGLYCERIN (NITROSTAT) 0.4 MG SL tablet Place 1 tablet (0.4 mg total) under the tongue every 5 (five) minutes x 3 doses as needed for chest pain (if no relief after 3rd dose, proceed to ED for evaluation or call 911). 25 tablet 3   NOVOLOG FLEXPEN 100 UNIT/ML FlexPen INJECT 25-31 UNITS INTO THE SKIN THREE TIMES DAILY BEFORE MEALS (Patient taking differently: Inject 50 Units into the skin in the morning, at noon, and at bedtime.) 30 mL 0   oxyCODONE-acetaminophen (PERCOCET/ROXICET) 5-325 MG tablet Take 1 tablet by mouth every 6 (six) hours as needed for severe pain. 30 tablet 0   No current facility-administered medications for this visit.   Facility-Administered  Medications Ordered in Other Visits  Medication Dose Route Frequency Provider Last Rate Last Admin   regadenoson (LEXISCAN) injection SOLN 0.4 mg  0.4 mg Intravenous Once Chilton Si, MD       technetium tetrofosmin (TC-MYOVIEW) injection 32.8 millicurie  32.8 millicurie Intravenous Once PRN Chilton Si, MD         Physical Exam: Vitals:   08/05/22 0936 08/05/22 0952  BP: (!) 150/75 130/80  Pulse: (!) 49 82  Resp: 20   SpO2: 98%   Patient alert and comfortable Heart rate regular Left upper sternal wound clean with 95% granulation tissue. The previous Sorback mesh was removed and the defect packed with a 4 x 4 gauze soaked in Vashe solution. Good range of motion of left shoulder pain  Diagnostic Tests: None  Impression: Left sternoclavicular joint wound is improving.  Continue with current wound care and daily antibiotics Continue with weekly clinic visits to assess progress and inspect the wound  Plan: Return in 1 week for wound check   Lovett Sox, MD [facility] [phone]

## 2022-08-07 DIAGNOSIS — I509 Heart failure, unspecified: Secondary | ICD-10-CM | POA: Diagnosis not present

## 2022-08-07 DIAGNOSIS — E1151 Type 2 diabetes mellitus with diabetic peripheral angiopathy without gangrene: Secondary | ICD-10-CM | POA: Diagnosis not present

## 2022-08-07 DIAGNOSIS — Z299 Encounter for prophylactic measures, unspecified: Secondary | ICD-10-CM | POA: Diagnosis not present

## 2022-08-07 DIAGNOSIS — S21109A Unspecified open wound of unspecified front wall of thorax without penetration into thoracic cavity, initial encounter: Secondary | ICD-10-CM | POA: Diagnosis not present

## 2022-08-07 DIAGNOSIS — M25512 Pain in left shoulder: Secondary | ICD-10-CM | POA: Diagnosis not present

## 2022-08-07 DIAGNOSIS — I10 Essential (primary) hypertension: Secondary | ICD-10-CM | POA: Diagnosis not present

## 2022-08-08 DIAGNOSIS — M4802 Spinal stenosis, cervical region: Secondary | ICD-10-CM | POA: Diagnosis not present

## 2022-08-08 DIAGNOSIS — E1169 Type 2 diabetes mellitus with other specified complication: Secondary | ICD-10-CM | POA: Diagnosis not present

## 2022-08-08 DIAGNOSIS — M869 Osteomyelitis, unspecified: Secondary | ICD-10-CM | POA: Diagnosis not present

## 2022-08-08 DIAGNOSIS — M009 Pyogenic arthritis, unspecified: Secondary | ICD-10-CM | POA: Diagnosis not present

## 2022-08-08 DIAGNOSIS — M50322 Other cervical disc degeneration at C5-C6 level: Secondary | ICD-10-CM | POA: Diagnosis not present

## 2022-08-11 DIAGNOSIS — E119 Type 2 diabetes mellitus without complications: Secondary | ICD-10-CM | POA: Diagnosis not present

## 2022-08-12 ENCOUNTER — Encounter: Payer: Self-pay | Admitting: Cardiothoracic Surgery

## 2022-08-12 ENCOUNTER — Ambulatory Visit: Payer: Medicare Other | Admitting: Cardiothoracic Surgery

## 2022-08-12 VITALS — BP 174/81 | HR 79 | Resp 18 | Ht 77.0 in | Wt 212.0 lb

## 2022-08-12 DIAGNOSIS — M009 Pyogenic arthritis, unspecified: Secondary | ICD-10-CM

## 2022-08-12 DIAGNOSIS — Z5189 Encounter for other specified aftercare: Secondary | ICD-10-CM

## 2022-08-12 NOTE — Progress Notes (Signed)
HPI: Patient presents for scheduled wound care of the left sternoclavicular joint abscess.  The patient is doing home wound care with daily wet-to-dry dressing changes using Vashe wound solution.  Patient is taking his oral cefadroxil antibiotic for the MSSA cultured infection. I personally examined the wound and change the dressing today. The wound is close 100% clean granulation tissue and is contracting nicely. I placed a new 4 x 4 gauze wet-to-dry Vashe solution.  Current Outpatient Medications  Medication Sig Dispense Refill   acetaminophen (TYLENOL) 500 MG tablet Take 1,000 mg by mouth every 6 (six) hours as needed for moderate pain.     amLODipine (NORVASC) 10 MG tablet Take 10 mg by mouth daily.     atorvastatin (LIPITOR) 80 MG tablet Take 1 tablet (80 mg total) by mouth daily. 30 tablet 3   carvedilol (COREG) 12.5 MG tablet Take 1 tablet (12.5 mg total) by mouth 2 (two) times daily. 30 tablet 3   cefadroxil (DURICEF) 500 MG capsule Take 2 capsules (1,000 mg total) by mouth 2 (two) times daily. Please take until finished 82 capsule 0   insulin glargine (LANTUS) 100 UNIT/ML Solostar Pen Inject 70-75 Units into the skin at bedtime.     lisinopril (ZESTRIL) 10 MG tablet Take 1 tablet (10 mg total) by mouth daily. 30 tablet 3   Multiple Vitamins-Minerals (PRESERVISION AREDS 2) CAPS Take 1 capsule by mouth 2 (two) times daily.     nitroGLYCERIN (NITROSTAT) 0.4 MG SL tablet Place 1 tablet (0.4 mg total) under the tongue every 5 (five) minutes x 3 doses as needed for chest pain (if no relief after 3rd dose, proceed to ED for evaluation or call 911). 25 tablet 3   NOVOLOG FLEXPEN 100 UNIT/ML FlexPen INJECT 25-31 UNITS INTO THE SKIN THREE TIMES DAILY BEFORE MEALS (Patient taking differently: Inject 50 Units into the skin in the morning, at noon, and at bedtime.) 30 mL 0   oxyCODONE-acetaminophen (PERCOCET/ROXICET) 5-325 MG tablet Take 1 tablet by mouth every 6 (six) hours as needed for severe pain.  30 tablet 0   No current facility-administered medications for this visit.   Facility-Administered Medications Ordered in Other Visits  Medication Dose Route Frequency Provider Last Rate Last Admin   regadenoson (LEXISCAN) injection SOLN 0.4 mg  0.4 mg Intravenous Once Chilton Si, MD       technetium tetrofosmin (TC-MYOVIEW) injection 32.8 millicurie  32.8 millicurie Intravenous Once PRN Chilton Si, MD         Physical Exam: Blood pressure (!) 174/81, pulse 79, resp. rate 18, height 6\' 5"  (1.956 m), weight 212 lb (96.2 kg), SpO2 95%.  Exam Alert and comfortable Lungs clear No chest wall tenderness or erythema Good upper extremity pulses good upper extremity range of motion Heart rate regular without murmur  Diagnostic Tests: None  Impression: Left sternoclavicular wound is healing nicely. Continue current wound care with Vashe solution wet-to-dry dressings and oral antibiotic  Plan: Return on July 31 for wound check to make sure that the family is packing the wound properly to fill the space beneath the upper margin of the wound.   Lovett Sox, MD [facility] [phone]

## 2022-08-14 ENCOUNTER — Encounter: Payer: Self-pay | Admitting: Cardiothoracic Surgery

## 2022-08-14 ENCOUNTER — Ambulatory Visit: Payer: Medicare Other | Admitting: Cardiothoracic Surgery

## 2022-08-14 VITALS — BP 150/73 | HR 88 | Resp 20 | Ht 77.0 in | Wt 212.0 lb

## 2022-08-14 DIAGNOSIS — Z951 Presence of aortocoronary bypass graft: Secondary | ICD-10-CM

## 2022-08-14 NOTE — Progress Notes (Signed)
HPI: Patient returns for wound care, scheduled appointment Left sternoclavicular joint infection being treated as outpatient with oral cefadroxil and daily wet-to-dry dressing changes with Vashe.  I personally examined the wound and change the dressing today.  The 4 x 4 gauze placed at home has slipped out of the upper pocket.  I explained carefully to the patient that the upper space needs to be packed by showed him how to use 2 x 2 wet-to-dry Vashe gauzes to stay in the proper place.  I think that using the larger 4 x 4 wet gauze caused it to fall out.  Patient will continue his antibiotic, diabetic management, and daily dressing changes with Vashe wet-to-dry now using a 2 x 2 gauze rather than larger 4 x 4 as the wound is contracting and to allow better access into the small tunnel space at the superior aspect of the wound.  He will return in 1 week for a wound check here in office.  Current Outpatient Medications  Medication Sig Dispense Refill   acetaminophen (TYLENOL) 500 MG tablet Take 1,000 mg by mouth every 6 (six) hours as needed for moderate pain.     amLODipine (NORVASC) 10 MG tablet Take 10 mg by mouth daily.     atorvastatin (LIPITOR) 80 MG tablet Take 1 tablet (80 mg total) by mouth daily. 30 tablet 3   carvedilol (COREG) 12.5 MG tablet Take 1 tablet (12.5 mg total) by mouth 2 (two) times daily. 30 tablet 3   cefadroxil (DURICEF) 500 MG capsule Take 2 capsules (1,000 mg total) by mouth 2 (two) times daily. Please take until finished 82 capsule 0   insulin glargine (LANTUS) 100 UNIT/ML Solostar Pen Inject 70-75 Units into the skin at bedtime.     lisinopril (ZESTRIL) 10 MG tablet Take 1 tablet (10 mg total) by mouth daily. 30 tablet 3   Multiple Vitamins-Minerals (PRESERVISION AREDS 2) CAPS Take 1 capsule by mouth 2 (two) times daily.     nitroGLYCERIN (NITROSTAT) 0.4 MG SL tablet Place 1 tablet (0.4 mg total) under the tongue every 5 (five) minutes x 3 doses as needed for chest  pain (if no relief after 3rd dose, proceed to ED for evaluation or call 911). 25 tablet 3   NOVOLOG FLEXPEN 100 UNIT/ML FlexPen INJECT 25-31 UNITS INTO THE SKIN THREE TIMES DAILY BEFORE MEALS (Patient taking differently: Inject 50 Units into the skin in the morning, at noon, and at bedtime.) 30 mL 0   oxyCODONE-acetaminophen (PERCOCET/ROXICET) 5-325 MG tablet Take 1 tablet by mouth every 6 (six) hours as needed for severe pain. 30 tablet 0   No current facility-administered medications for this visit.   Facility-Administered Medications Ordered in Other Visits  Medication Dose Route Frequency Provider Last Rate Last Admin   regadenoson (LEXISCAN) injection SOLN 0.4 mg  0.4 mg Intravenous Once Chilton Si, MD       technetium tetrofosmin (TC-MYOVIEW) injection 32.8 millicurie  32.8 millicurie Intravenous Once PRN Chilton Si, MD         Physical Exam: Blood pressure (!) 150/73, pulse 88, resp. rate 20, height 6\' 5"  (1.956 m), weight 212 lb (96.2 kg), SpO2 95%.   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

## 2022-08-16 DIAGNOSIS — M869 Osteomyelitis, unspecified: Secondary | ICD-10-CM | POA: Diagnosis not present

## 2022-08-16 DIAGNOSIS — M00012 Staphylococcal arthritis, left shoulder: Secondary | ICD-10-CM | POA: Diagnosis not present

## 2022-08-16 DIAGNOSIS — L02416 Cutaneous abscess of left lower limb: Secondary | ICD-10-CM | POA: Diagnosis not present

## 2022-08-16 DIAGNOSIS — B9561 Methicillin susceptible Staphylococcus aureus infection as the cause of diseases classified elsewhere: Secondary | ICD-10-CM | POA: Diagnosis not present

## 2022-08-16 DIAGNOSIS — E1169 Type 2 diabetes mellitus with other specified complication: Secondary | ICD-10-CM | POA: Diagnosis not present

## 2022-08-22 ENCOUNTER — Ambulatory Visit: Payer: Medicare Other | Admitting: Cardiothoracic Surgery

## 2022-08-22 ENCOUNTER — Encounter: Payer: Self-pay | Admitting: Cardiothoracic Surgery

## 2022-08-22 VITALS — BP 154/79 | HR 99 | Resp 20 | Wt 215.2 lb

## 2022-08-22 DIAGNOSIS — Z5189 Encounter for other specified aftercare: Secondary | ICD-10-CM

## 2022-08-22 DIAGNOSIS — S21109A Unspecified open wound of unspecified front wall of thorax without penetration into thoracic cavity, initial encounter: Secondary | ICD-10-CM | POA: Diagnosis not present

## 2022-08-22 DIAGNOSIS — Z951 Presence of aortocoronary bypass graft: Secondary | ICD-10-CM

## 2022-08-22 MED ORDER — CEFADROXIL 500 MG PO CAPS: 1000.0000 mg | ORAL_CAPSULE | Freq: Two times a day (BID) | ORAL | 0 refills | Status: AC

## 2022-08-22 NOTE — Progress Notes (Signed)
HPI: The patient returns for wound care of his left sternoclavicular joint. Previously cultured for MSSA and required surgical debridement x3.  Now packing the wound daily with Vashe hypochlorous acid wound solution wet-to-dry with 2 x 2 gauze. He is still having some yellow mucoid drainage.  For that reason we will extend the antibiotic Romualdo Bolk) another week.  I personally cleaned and repacked the wound.  There remains a tunnel tracking superiorly that needs to be packed with a 2 x 2 gauze daily.  He understands this aspect of the wound care.  Otherwise there is no significant pain, no fever, no malaise. He is still checking his blood sugar Current Outpatient Medications  Medication Sig Dispense Refill   acetaminophen (TYLENOL) 500 MG tablet Take 1,000 mg by mouth every 6 (six) hours as needed for moderate pain.     amLODipine (NORVASC) 10 MG tablet Take 10 mg by mouth daily.     atorvastatin (LIPITOR) 80 MG tablet Take 1 tablet (80 mg total) by mouth daily. 30 tablet 3   carvedilol (COREG) 12.5 MG tablet Take 1 tablet (12.5 mg total) by mouth 2 (two) times daily. 30 tablet 3   cefadroxil (DURICEF) 500 MG capsule Take 2 capsules (1,000 mg total) by mouth 2 (two) times daily. Please take until finished 82 capsule 0   insulin glargine (LANTUS) 100 UNIT/ML Solostar Pen Inject 70-75 Units into the skin at bedtime.     lisinopril (ZESTRIL) 10 MG tablet Take 1 tablet (10 mg total) by mouth daily. 30 tablet 3   Multiple Vitamins-Minerals (PRESERVISION AREDS 2) CAPS Take 1 capsule by mouth 2 (two) times daily.     nitroGLYCERIN (NITROSTAT) 0.4 MG SL tablet Place 1 tablet (0.4 mg total) under the tongue every 5 (five) minutes x 3 doses as needed for chest pain (if no relief after 3rd dose, proceed to ED for evaluation or call 911). 25 tablet 3   NOVOLOG FLEXPEN 100 UNIT/ML FlexPen INJECT 25-31 UNITS INTO THE SKIN THREE TIMES DAILY BEFORE MEALS (Patient taking differently: Inject 50 Units into the skin  in the morning, at noon, and at bedtime.) 30 mL 0   oxyCODONE-acetaminophen (PERCOCET/ROXICET) 5-325 MG tablet Take 1 tablet by mouth every 6 (six) hours as needed for severe pain. 30 tablet 0   No current facility-administered medications for this visit.   Facility-Administered Medications Ordered in Other Visits  Medication Dose Route Frequency Provider Last Rate Last Admin   regadenoson (LEXISCAN) injection SOLN 0.4 mg  0.4 mg Intravenous Once Chilton Si, MD       technetium tetrofosmin (TC-MYOVIEW) injection 32.8 millicurie  32.8 millicurie Intravenous Once PRN Chilton Si, MD         Physical Exam: Blood pressure (!) 154/79, pulse 99, resp. rate 20, weight 215 lb 3.2 oz (97.6 kg), SpO2 96%.   Alert and comfortable Lungs clear No fluctuance over the chest Mild skin irritation from the tape around the wound site.  The wound measures 2.5 cm long and 1 cm wide. No cardiac murmur, regular rhythm  Diagnostic Tests: None  Impression: MSSA infection of the left sternoclavicular joint receiving oral antibiotics and daily packing with Vashe wound solution and weekly outpatient office follow-up.  Plan: Return for wound check and wound care on Monday, August 12.   Lovett Sox, MD [facility] [phone]

## 2022-08-26 ENCOUNTER — Ambulatory Visit: Payer: Medicare Other | Admitting: Cardiothoracic Surgery

## 2022-08-26 ENCOUNTER — Encounter: Payer: Self-pay | Admitting: Cardiothoracic Surgery

## 2022-08-26 VITALS — BP 166/73 | HR 82 | Resp 20 | Ht 77.0 in | Wt 215.0 lb

## 2022-08-26 DIAGNOSIS — L02416 Cutaneous abscess of left lower limb: Secondary | ICD-10-CM | POA: Diagnosis not present

## 2022-08-26 DIAGNOSIS — M009 Pyogenic arthritis, unspecified: Secondary | ICD-10-CM | POA: Diagnosis not present

## 2022-08-26 DIAGNOSIS — Z4800 Encounter for change or removal of nonsurgical wound dressing: Secondary | ICD-10-CM | POA: Insufficient documentation

## 2022-08-26 DIAGNOSIS — E1169 Type 2 diabetes mellitus with other specified complication: Secondary | ICD-10-CM | POA: Diagnosis not present

## 2022-08-26 DIAGNOSIS — Z5189 Encounter for other specified aftercare: Secondary | ICD-10-CM | POA: Insufficient documentation

## 2022-08-26 DIAGNOSIS — M00012 Staphylococcal arthritis, left shoulder: Secondary | ICD-10-CM | POA: Diagnosis not present

## 2022-08-26 DIAGNOSIS — Z951 Presence of aortocoronary bypass graft: Secondary | ICD-10-CM

## 2022-08-26 DIAGNOSIS — Z4801 Encounter for change or removal of surgical wound dressing: Secondary | ICD-10-CM | POA: Diagnosis not present

## 2022-08-26 DIAGNOSIS — B9561 Methicillin susceptible Staphylococcus aureus infection as the cause of diseases classified elsewhere: Secondary | ICD-10-CM | POA: Diagnosis not present

## 2022-08-26 NOTE — Progress Notes (Signed)
HPI: Patient returns for scheduled postop follow-up for dressing change of his left sternoclavicular joint.  He is packing it daily with Vashe solution wet-to-dry 2 x 2 gauze.  He is taking supplemental cefadroxil for positive MSSA cultures.  Wound is healing and granulating.  There is no drainage. There is still some left shoulder soreness with range of motion.  This is improving. He denies fever.  Blood sugars are under good control.  Current Outpatient Medications  Medication Sig Dispense Refill   acetaminophen (TYLENOL) 500 MG tablet Take 1,000 mg by mouth every 6 (six) hours as needed for moderate pain.     amLODipine (NORVASC) 10 MG tablet Take 10 mg by mouth daily.     atorvastatin (LIPITOR) 80 MG tablet Take 1 tablet (80 mg total) by mouth daily. 30 tablet 3   carvedilol (COREG) 12.5 MG tablet Take 1 tablet (12.5 mg total) by mouth 2 (two) times daily. 30 tablet 3   cefadroxil (DURICEF) 500 MG capsule Take 2 capsules (1,000 mg total) by mouth 2 (two) times daily for 14 doses. Please take until finished 28 capsule 0   insulin glargine (LANTUS) 100 UNIT/ML Solostar Pen Inject 70-75 Units into the skin at bedtime.     lisinopril (ZESTRIL) 10 MG tablet Take 1 tablet (10 mg total) by mouth daily. 30 tablet 3   Multiple Vitamins-Minerals (PRESERVISION AREDS 2) CAPS Take 1 capsule by mouth 2 (two) times daily.     nitroGLYCERIN (NITROSTAT) 0.4 MG SL tablet Place 1 tablet (0.4 mg total) under the tongue every 5 (five) minutes x 3 doses as needed for chest pain (if no relief after 3rd dose, proceed to ED for evaluation or call 911). 25 tablet 3   NOVOLOG FLEXPEN 100 UNIT/ML FlexPen INJECT 25-31 UNITS INTO THE SKIN THREE TIMES DAILY BEFORE MEALS (Patient taking differently: Inject 50 Units into the skin in the morning, at noon, and at bedtime.) 30 mL 0   oxyCODONE-acetaminophen (PERCOCET/ROXICET) 5-325 MG tablet Take 1 tablet by mouth every 6 (six) hours as needed for severe pain. 30 tablet 0   No  current facility-administered medications for this visit.   Facility-Administered Medications Ordered in Other Visits  Medication Dose Route Frequency Provider Last Rate Last Admin   regadenoson (LEXISCAN) injection SOLN 0.4 mg  0.4 mg Intravenous Once Chilton Si, MD       technetium tetrofosmin (TC-MYOVIEW) injection 32.8 millicurie  32.8 millicurie Intravenous Once PRN Chilton Si, MD         Physical Exam:      Exam  Blood pressure (!) 166/73, pulse 82, resp. rate 20, height 6\' 5"  (1.956 m), weight 215 lb (97.5 kg), SpO2 96%.     General- alert and comfortable.  I performed a dressing change with 2 x 2 gauze and Vashe solution.  There is a 2 cm sinus tract at the superior aspect of the wound tracking in the midline.  I instructed the family member on how to properly packed the wound to the depth of the wound.    Neck- no JVD, no cervical adenopathy palpable, no carotid bruit   Lungs- clear without rales, wheezes   Cor- regular rate and rhythm, no murmur , gallop   Abdomen- soft, non-tender   Extremities - warm, non-tender, minimal edema   Neuro- oriented, appropriate, no focal weakness   Diagnostic Tests: None  Impression: Healing left sternoclavicular joint wound following debridement and outpatient wound care for MSSA infection.  Plan: Continue oral cefadroxil. Continue daily  wet-to-dry dressing changes with Vashe solution. Continue weekly clinic visits for monitoring of progress. Patient not cleared at this time for return to work.  Lovett Sox, MD [facility] [phone]

## 2022-08-30 DIAGNOSIS — M00012 Staphylococcal arthritis, left shoulder: Secondary | ICD-10-CM | POA: Diagnosis not present

## 2022-08-30 DIAGNOSIS — M869 Osteomyelitis, unspecified: Secondary | ICD-10-CM | POA: Diagnosis not present

## 2022-08-30 DIAGNOSIS — E1169 Type 2 diabetes mellitus with other specified complication: Secondary | ICD-10-CM | POA: Diagnosis not present

## 2022-08-30 DIAGNOSIS — B9561 Methicillin susceptible Staphylococcus aureus infection as the cause of diseases classified elsewhere: Secondary | ICD-10-CM | POA: Diagnosis not present

## 2022-08-30 DIAGNOSIS — L02416 Cutaneous abscess of left lower limb: Secondary | ICD-10-CM | POA: Diagnosis not present

## 2022-09-02 ENCOUNTER — Ambulatory Visit: Payer: Self-pay | Admitting: *Deleted

## 2022-09-02 ENCOUNTER — Encounter: Payer: Self-pay | Admitting: *Deleted

## 2022-09-02 NOTE — Patient Outreach (Signed)
Care Coordination   Follow Up Visit Note   09/02/2022  Name: George [name] MRN: [id number] DOB: 1945/02/24  George [name] is a 77 y.o. year old male who sees George Peri, MD for primary care. I spoke with George [name] and wife, George [name] by phone today.  What matters to the patients health and wellness today?  Assess Need for Social Work Involvement.   Goals Addressed               This Visit's Progress     COMPLETED: Assess Need for Social Work Involvement. (pt-stated)   On track     Care Coordination Interventions:  Interventions Today    Flowsheet Row Most Recent Value  Chronic Disease   Chronic disease during today's visit Diabetes, Hypertension (HTN), Other  [Coronary Artery Disease & Non-ST Elevated Myocardial Infarction]  General Interventions   General Interventions Discussed/Reviewed General Interventions Discussed, Labs, Vaccines, Doctor Visits, Referral to Nurse, Health Screening, Annual Foot Exam, General Interventions Reviewed, Annual Eye Exam, Lipid Profile, Durable Medical Equipment (DME), Community Resources, Communication with, Level of Care  [Encouraged]  Labs Hgb A1c every 3 months, Kidney Function  [Encouraged]  Vaccines COVID-19, Flu, Pneumonia, RSV, Shingles, Tetanus/Pertussis/Diphtheria  [Encouraged]  Doctor Visits Discussed/Reviewed Doctor Visits Discussed, Specialist, Doctor Visits Reviewed, Annual Wellness Visits, PCP  [Encouraged]  Health Screening Bone Density, Colonoscopy, Prostate  [Encouraged]  Durable Medical Equipment (DME) Bed side commode, Glucomoter, Other  [Eye Glasses]  PCP/Specialist Visits Compliance with follow-up visit  [Encouraged]  Communication with PCP/Specialists, RN  [Encouraged]  Level of Care Adult Daycare, Personal Care Services, Applications, Assisted Living  [Encouraged]  Applications Medicaid, Personal Care Services, [place]-2  [Encouraged]  Exercise Interventions   Exercise Discussed/Reviewed Exercise Discussed, Assistive  device use and maintanence, Exercise Reviewed, Physical Activity, Weight Managment  [Encouraged]  Physical Activity Discussed/Reviewed Physical Activity Discussed, Home Exercise Program (HEP), Physical Activity Reviewed, Types of exercise  [Encouraged]  Weight Management Weight loss  [Encouraged]  Education Interventions   Education Provided Provided Therapist, sports, Provided Web-based Education, Provided Education  [Encouraged]  Provided Verbal Education On Nutrition, Mental Health/Coping with Illness, When to see the doctor, Foot Care, Eye Care, Labs, Blood Sugar Monitoring, Applications, Exercise, Medication, Development worker, community, [employer]  [Encouraged]  Applications Medicaid, Personal Care Services, [place]-2  [Encouraged]  Mental Health Interventions   Mental Health Discussed/Reviewed Mental Health Discussed, Anxiety, Depression, Grief and Loss, Mental Health Reviewed, Substance Abuse, Coping Strategies, Crisis, Other, Suicide  [Domestic Violence]  Nutrition Interventions   Nutrition Discussed/Reviewed Nutrition Discussed, Adding fruits and vegetables, Increasing proteins, Nutrition Reviewed, Decreasing fats, Decreasing salt, Fluid intake, Carbohydrate meal planning, Portion sizes, Decreasing sugar intake  [Encouraged]  Pharmacy Interventions   Pharmacy Dicussed/Reviewed Pharmacy Topics Discussed, Pharmacy Topics Reviewed, Medication Adherence, Affording Medications  [Encouraged]  Safety Interventions   Safety Discussed/Reviewed Safety Discussed, Safety Reviewed, Fall Risk, Home Safety  [Encouraged]  Home Safety Assistive Devices, Contact provider for referral to PT/OT, Need for home safety assessment, Refer for home visit, Refer for community resources, Contact home health agency  [Encouraged]  Advanced Directive Interventions   Advanced Directives Discussed/Reviewed Advanced Directives Discussed  [Encouraged]      Active Listening & Reflection Utilized.  Verbalization of Feelings  Encouraged.  Emotional Support Provided. Self-Enrollment in Caregiver Support Group of Interest Emphasized. Problem Solving Interventions Employed. Task-Centered Solutions Indicated.   Solution-Focused Strategies Activated. Acceptance & Commitment Therapy Performed. Cognitive Behavioral Therapy Initiated. Client-Centered Therapy Implemented. Encouraged Increased Level of Activity & Exercise, as  Tolerated. Encouraged Administration of Medications, Exactly as Prescribed. Encouraged Attendance at Follow-Up Appointment with Dr. Kathlee Nations Trigt, Cardiothoracic Surgeon with [facility] [phone]), Scheduled on 09/03/2022 at 9:30 AM, for Wound Care & Dressing Changes of Left Sternoclavicular Joint.  Encouraged Solicitor with [employer], Nurse, adult, Field seismologist of Interest in [place], from List Provided, in An Effort to [employer] & Supervision in The Home [employer]. Encouraged Contact with CSW (# [phone]), if You Have Questions, Need Assistance, or If Additional Social Work Needs Are Identified in The Near Future.      SDOH assessments and interventions completed:  Yes.  Care Coordination Interventions:  Yes, provided.   Follow up plan: No further intervention required.   Encounter Outcome:  Pt. Visit Completed.   George [name], BSW, MSW, LCSW  Licensed Restaurant manager, fast food [facility]  Mailing [place] [address] [place], [place] [address] Physical Address-[address], [place], [place] [address] Toll Free Main # [phone] Fax # [phone] Cell # [phone] [name]

## 2022-09-02 NOTE — Patient Instructions (Signed)
Visit Information  Thank you for taking time to visit with me today. Please don't hesitate to contact me if I can be of assistance to you.   Following are the goals we discussed today:   Goals Addressed               This Visit's Progress     COMPLETED: Assess Need for Social Work Involvement. (pt-stated)   On track     Care Coordination Interventions:  Interventions Today    Flowsheet Row Most Recent Value  Chronic Disease   Chronic disease during today's visit Diabetes, Hypertension (HTN), Other  [Coronary Artery Disease & Non-ST Elevated Myocardial Infarction]  General Interventions   General Interventions Discussed/Reviewed General Interventions Discussed, Labs, Vaccines, Doctor Visits, Referral to Nurse, Health Screening, Annual Foot Exam, General Interventions Reviewed, Annual Eye Exam, Lipid Profile, Durable Medical Equipment (DME), Community Resources, Communication with, Level of Care  [Encouraged]  Labs Hgb A1c every 3 months, Kidney Function  [Encouraged]  Vaccines COVID-19, Flu, Pneumonia, RSV, Shingles, Tetanus/Pertussis/Diphtheria  [Encouraged]  Doctor Visits Discussed/Reviewed Doctor Visits Discussed, Specialist, Doctor Visits Reviewed, Annual Wellness Visits, PCP  [Encouraged]  Health Screening Bone Density, Colonoscopy, Prostate  [Encouraged]  Durable Medical Equipment (DME) Bed side commode, Glucomoter, Other  [Eye Glasses]  PCP/Specialist Visits Compliance with follow-up visit  [Encouraged]  Communication with PCP/Specialists, RN  [Encouraged]  Level of Care Adult Daycare, Personal Care Services, Applications, Assisted Living  [Encouraged]  Applications Medicaid, Personal Care Services, [place]-2  [Encouraged]  Exercise Interventions   Exercise Discussed/Reviewed Exercise Discussed, Assistive device use and maintanence, Exercise Reviewed, Physical Activity, Weight Managment  [Encouraged]  Physical Activity Discussed/Reviewed Physical Activity Discussed, Home Exercise  Program (HEP), Physical Activity Reviewed, Types of exercise  [Encouraged]  Weight Management Weight loss  [Encouraged]  Education Interventions   Education Provided Provided Therapist, sports, Provided Web-based Education, Provided Education  [Encouraged]  Provided Verbal Education On Nutrition, Mental Health/Coping with Illness, When to see the doctor, Foot Care, Eye Care, Labs, Blood Sugar Monitoring, Applications, Exercise, Medication, Development worker, community, [employer]  [Encouraged]  Applications Medicaid, Personal Care Services, [place]-2  [Encouraged]  Mental Health Interventions   Mental Health Discussed/Reviewed Mental Health Discussed, Anxiety, Depression, Grief and Loss, Mental Health Reviewed, Substance Abuse, Coping Strategies, Crisis, Other, Suicide  [Domestic Violence]  Nutrition Interventions   Nutrition Discussed/Reviewed Nutrition Discussed, Adding fruits and vegetables, Increasing proteins, Nutrition Reviewed, Decreasing fats, Decreasing salt, Fluid intake, Carbohydrate meal planning, Portion sizes, Decreasing sugar intake  [Encouraged]  Pharmacy Interventions   Pharmacy Dicussed/Reviewed Pharmacy Topics Discussed, Pharmacy Topics Reviewed, Medication Adherence, Affording Medications  [Encouraged]  Safety Interventions   Safety Discussed/Reviewed Safety Discussed, Safety Reviewed, Fall Risk, Home Safety  [Encouraged]  Home Safety Assistive Devices, Contact provider for referral to PT/OT, Need for home safety assessment, Refer for home visit, Refer for community resources, Contact home health agency  [Encouraged]  Advanced Directive Interventions   Advanced Directives Discussed/Reviewed Advanced Directives Discussed  [Encouraged]      Active Listening & Reflection Utilized.  Verbalization of Feelings Encouraged.  Emotional Support Provided. Self-Enrollment in Caregiver Support Group of Interest Emphasized. Problem Solving Interventions Employed. Task-Centered Solutions  Indicated.   Solution-Focused Strategies Activated. Acceptance & Commitment Therapy Performed. Cognitive Behavioral Therapy Initiated. Client-Centered Therapy Implemented. Encouraged Increased Level of Activity & Exercise, as Tolerated. Encouraged Administration of Medications, Exactly as Prescribed. Encouraged Attendance at Follow-Up Appointment with Dr. Kathlee Nations Trigt, Cardiothoracic Surgeon with [facility] [phone]), Scheduled on 09/03/2022  at 9:30 AM, for Wound Care & Dressing Changes of Left Sternoclavicular Joint.  Encouraged Solicitor with [employer], Nurse, adult, Field seismologist of Interest in [place], from List Provided, in An Effort to [employer] & Supervision in The Home [employer]. Encouraged Contact with CSW (# [phone]), if You Have Questions, Need Assistance, or If Additional Social Work Needs Are Identified in The Near Future.      Please call the care guide team at [phone] if you need to cancel or reschedule your appointment.   If you are experiencing a Mental Health or Behavioral Health Crisis or need someone to talk to, please call the Suicide and Crisis Lifeline: 988 call the [place] National Suicide Prevention Lifeline: (430) 055-1450 or TTY: 725-688-3179 TTY (813)262-2446) to talk to a trained counselor call 1-800-273-TALK (toll free, 24 hour hotline) go to [facility] [address] [place] [phone]) call the [facility] Crisis Line: [phone] call 911  Patient verbalizes understanding of instructions and care plan provided today and agrees to view in MyChart. Active MyChart status and patient understanding of how to access instructions and care plan via MyChart confirmed with patient.     No further follow up required.  [name], BSW, MSW, LCSW  Licensed Restaurant manager, fast food [facility]   Mailing [place] [address] [place], [place] [address] Physical Address-[address] [place], [place] [address] Toll Free Main # [phone] Fax # [phone] Cell # [phone] [name]

## 2022-09-03 ENCOUNTER — Encounter: Payer: Self-pay | Admitting: Cardiothoracic Surgery

## 2022-09-03 ENCOUNTER — Ambulatory Visit: Payer: Medicare Other | Admitting: Cardiothoracic Surgery

## 2022-09-03 VITALS — BP 160/76 | HR 72 | Resp 20 | Ht 77.0 in | Wt 215.0 lb

## 2022-09-03 DIAGNOSIS — M009 Pyogenic arthritis, unspecified: Secondary | ICD-10-CM

## 2022-09-03 DIAGNOSIS — Z4801 Encounter for change or removal of surgical wound dressing: Secondary | ICD-10-CM

## 2022-09-03 DIAGNOSIS — Z951 Presence of aortocoronary bypass graft: Secondary | ICD-10-CM

## 2022-09-03 NOTE — Progress Notes (Signed)
HPI: Patient returns to the clinic for scheduled wound care.  He is being treated for a left sternoclavicular joint abscess with MSSA.  After several debridements and a long course of antibiotics this is now being treated with daily wet-to-dry packing with Vashe wound solution.  The wound is closing in and now is only a fairly narrow sinus tract extending 1 cm deep.  I repacked the wound today with a Vashe wet-to-dry 2 x 2 gauze.  The patient will continue this at home and they will make every effort packed the wound to the very depth to prevent it from superficial closure.  Patient can resume working half days and fairly normal activities. Antibiotics have been completed and there is no further cellulitis.  Current Outpatient Medications  Medication Sig Dispense Refill   acetaminophen (TYLENOL) 500 MG tablet Take 1,000 mg by mouth every 6 (six) hours as needed for moderate pain.     amLODipine (NORVASC) 10 MG tablet Take 10 mg by mouth daily.     atorvastatin (LIPITOR) 80 MG tablet Take 1 tablet (80 mg total) by mouth daily. 30 tablet 3   carvedilol (COREG) 12.5 MG tablet Take 1 tablet (12.5 mg total) by mouth 2 (two) times daily. 30 tablet 3   insulin glargine (LANTUS) 100 UNIT/ML Solostar Pen Inject 70-75 Units into the skin at bedtime.     lisinopril (ZESTRIL) 10 MG tablet Take 1 tablet (10 mg total) by mouth daily. 30 tablet 3   Multiple Vitamins-Minerals (PRESERVISION AREDS 2) CAPS Take 1 capsule by mouth 2 (two) times daily.     nitroGLYCERIN (NITROSTAT) 0.4 MG SL tablet Place 1 tablet (0.4 mg total) under the tongue every 5 (five) minutes x 3 doses as needed for chest pain (if no relief after 3rd dose, proceed to ED for evaluation or call 911). 25 tablet 3   NOVOLOG FLEXPEN 100 UNIT/ML FlexPen INJECT 25-31 UNITS INTO THE SKIN THREE TIMES DAILY BEFORE MEALS (Patient taking differently: Inject 50 Units into the skin in the morning, at noon, and at bedtime.) 30 mL 0   No current  facility-administered medications for this visit.   Facility-Administered Medications Ordered in Other Visits  Medication Dose Route Frequency Provider Last Rate Last Admin   regadenoson (LEXISCAN) injection SOLN 0.4 mg  0.4 mg Intravenous Once Chilton Si, MD       technetium tetrofosmin (TC-MYOVIEW) injection 32.8 millicurie  32.8 millicurie Intravenous Once PRN Chilton Si, MD         Physical Exam: Blood pressure (!) 160/76, pulse 72, resp. rate 20, height 6\' 5"  (1.956 m), weight 215 lb (97.5 kg), SpO2 97%.   Patient is alert and comfortable Lungs clear Wound to the left of the upper sternum measures 2 cm long by 1 cm deep.  There is some granulation tissue superficially. Lungs are clear there Sinus rhythm well murmur Neuro intact after  Diagnostic Tests: None  Impression: Sternoclavicular wound from MSSA Continue daily wet-to-dry dressings with Vashe solution and regular clinic follow-up Plan: Return in 6 days for wound check and wound care.   Lovett Sox, MD [facility] [phone]

## 2022-09-04 DIAGNOSIS — S21109A Unspecified open wound of unspecified front wall of thorax without penetration into thoracic cavity, initial encounter: Secondary | ICD-10-CM | POA: Diagnosis not present

## 2022-09-06 DIAGNOSIS — M869 Osteomyelitis, unspecified: Secondary | ICD-10-CM | POA: Diagnosis not present

## 2022-09-06 DIAGNOSIS — M00012 Staphylococcal arthritis, left shoulder: Secondary | ICD-10-CM | POA: Diagnosis not present

## 2022-09-06 DIAGNOSIS — L02416 Cutaneous abscess of left lower limb: Secondary | ICD-10-CM | POA: Diagnosis not present

## 2022-09-06 DIAGNOSIS — E1169 Type 2 diabetes mellitus with other specified complication: Secondary | ICD-10-CM | POA: Diagnosis not present

## 2022-09-06 DIAGNOSIS — B9561 Methicillin susceptible Staphylococcus aureus infection as the cause of diseases classified elsewhere: Secondary | ICD-10-CM | POA: Diagnosis not present

## 2022-09-09 ENCOUNTER — Ambulatory Visit: Payer: Medicare Other | Admitting: Cardiothoracic Surgery

## 2022-09-09 ENCOUNTER — Encounter: Payer: Self-pay | Admitting: Cardiothoracic Surgery

## 2022-09-09 VITALS — BP 169/85 | HR 82 | Resp 18 | Ht 77.0 in

## 2022-09-09 DIAGNOSIS — Z5189 Encounter for other specified aftercare: Secondary | ICD-10-CM | POA: Diagnosis not present

## 2022-09-09 DIAGNOSIS — M009 Pyogenic arthritis, unspecified: Secondary | ICD-10-CM | POA: Diagnosis not present

## 2022-09-09 NOTE — Progress Notes (Signed)
HPI: The patient returns for scheduled wound care of the left sternoclavicular joint. The wound is almost healed  with some external granulation tissue which was cauterized with silver nitrate stick application. There is a 1 cm sinus tract which will not admit a Q-tip but was cauterized with a silver nitrate applicator stick.  Vashe wet-to-dry 2 x 2 gauze was placed with paper tape.  The patient/family will continue daily dressing changes with Vashe and use the silver nitrate applicator stick to keep the sinus tract cauterized so it will heal in with proper tissue.  Current Outpatient Medications  Medication Sig Dispense Refill   acetaminophen (TYLENOL) 500 MG tablet Take 1,000 mg by mouth every 6 (six) hours as needed for moderate pain.     amLODipine (NORVASC) 10 MG tablet Take 10 mg by mouth daily.     atorvastatin (LIPITOR) 80 MG tablet Take 1 tablet (80 mg total) by mouth daily. 30 tablet 3   carvedilol (COREG) 12.5 MG tablet Take 1 tablet (12.5 mg total) by mouth 2 (two) times daily. 30 tablet 3   insulin glargine (LANTUS) 100 UNIT/ML Solostar Pen Inject 70-75 Units into the skin at bedtime.     lisinopril (ZESTRIL) 10 MG tablet Take 1 tablet (10 mg total) by mouth daily. 30 tablet 3   Multiple Vitamins-Minerals (PRESERVISION AREDS 2) CAPS Take 1 capsule by mouth 2 (two) times daily.     nitroGLYCERIN (NITROSTAT) 0.4 MG SL tablet Place 1 tablet (0.4 mg total) under the tongue every 5 (five) minutes x 3 doses as needed for chest pain (if no relief after 3rd dose, proceed to ED for evaluation or call 911). 25 tablet 3   NOVOLOG FLEXPEN 100 UNIT/ML FlexPen INJECT 25-31 UNITS INTO THE SKIN THREE TIMES DAILY BEFORE MEALS (Patient taking differently: Inject 50 Units into the skin in the morning, at noon, and at bedtime.) 30 mL 0   No current facility-administered medications for this visit.   Facility-Administered Medications Ordered in Other Visits  Medication Dose Route Frequency Provider  Last Rate Last Admin   regadenoson (LEXISCAN) injection SOLN 0.4 mg  0.4 mg Intravenous Once Chilton Si, MD       technetium tetrofosmin (TC-MYOVIEW) injection 32.8 millicurie  32.8 millicurie Intravenous Once PRN Chilton Si, MD         Physical Exam: Blood pressure (!) 169/85, pulse 82, resp. rate 18, height 6\' 5"  (1.956 m), SpO2 96%.   Alert and comfortable Lungs clear No chest wall tenderness Wound now healed and except for a small sinus tract Heart rate regular without murmur Neuro intact   Diagnostic Tests: None  Impression: Healing left sternoclavicular joint MSSA infection. Continue daily wound care as described above  Plan:  Return in 8 days for wound care and assessment of progress.   Lovett Sox, MD [facility] [phone]

## 2022-09-10 ENCOUNTER — Telehealth: Payer: Self-pay | Admitting: *Deleted

## 2022-09-10 DIAGNOSIS — S21109A Unspecified open wound of unspecified front wall of thorax without penetration into thoracic cavity, initial encounter: Secondary | ICD-10-CM | POA: Diagnosis not present

## 2022-09-10 NOTE — Telephone Encounter (Signed)
Patient's wife contacted the office stating she used applicator stick Dr. Donata Clay provided and incision is now bleeding. Per wife, she performed wound care this morning. Patient came home from work early with wound bleeding. Wife states she has been holding pressure and bleeding has since subsided. Wife states she used cauterizing sticks for the wound tract as Dr. Donata Clay had directed. Wife states bleeding is not coming from the tract but from an area below the wound. Wife states she remembers seeing a scab at that area this morning. Patient is not on blood thinners. Advised to continue to hold pressure to area until bleeding stops. Advised she may place a bandaid over the site if she wishes. Wife verbalized understanding.

## 2022-09-13 DIAGNOSIS — M869 Osteomyelitis, unspecified: Secondary | ICD-10-CM | POA: Diagnosis not present

## 2022-09-13 DIAGNOSIS — L02416 Cutaneous abscess of left lower limb: Secondary | ICD-10-CM | POA: Diagnosis not present

## 2022-09-13 DIAGNOSIS — E1169 Type 2 diabetes mellitus with other specified complication: Secondary | ICD-10-CM | POA: Diagnosis not present

## 2022-09-13 DIAGNOSIS — B9561 Methicillin susceptible Staphylococcus aureus infection as the cause of diseases classified elsewhere: Secondary | ICD-10-CM | POA: Diagnosis not present

## 2022-09-13 DIAGNOSIS — M00012 Staphylococcal arthritis, left shoulder: Secondary | ICD-10-CM | POA: Diagnosis not present

## 2022-09-17 ENCOUNTER — Ambulatory Visit: Payer: Medicare Other | Admitting: Cardiothoracic Surgery

## 2022-09-17 ENCOUNTER — Encounter: Payer: Self-pay | Admitting: Cardiothoracic Surgery

## 2022-09-17 VITALS — HR 58 | Resp 20 | Ht 77.0 in | Wt 217.0 lb

## 2022-09-17 DIAGNOSIS — Z5189 Encounter for other specified aftercare: Secondary | ICD-10-CM | POA: Insufficient documentation

## 2022-09-17 DIAGNOSIS — M009 Pyogenic arthritis, unspecified: Secondary | ICD-10-CM

## 2022-09-17 NOTE — Progress Notes (Signed)
HPI: The patient presents for wound care. The L sternoclavicular joint wound is being packed with Vashe wet to dry daily. I personally cleaned and repacked the wound with a 2x2 gauze soaked in Vashe and applied a Tegaderm cover.  Current Outpatient Medications  Medication Sig Dispense Refill   acetaminophen (TYLENOL) 500 MG tablet Take 1,000 mg by mouth every 6 (six) hours as needed for moderate pain.     amLODipine (NORVASC) 10 MG tablet Take 10 mg by mouth daily.     atorvastatin (LIPITOR) 80 MG tablet Take 1 tablet (80 mg total) by mouth daily. 30 tablet 3   carvedilol (COREG) 12.5 MG tablet Take 1 tablet (12.5 mg total) by mouth 2 (two) times daily. 30 tablet 3   insulin glargine (LANTUS) 100 UNIT/ML Solostar Pen Inject 70-75 Units into the skin at bedtime.     lisinopril (ZESTRIL) 10 MG tablet Take 1 tablet (10 mg total) by mouth daily. 30 tablet 3   Multiple Vitamins-Minerals (PRESERVISION AREDS 2) CAPS Take 1 capsule by mouth 2 (two) times daily.     nitroGLYCERIN (NITROSTAT) 0.4 MG SL tablet Place 1 tablet (0.4 mg total) under the tongue every 5 (five) minutes x 3 doses as needed for chest pain (if no relief after 3rd dose, proceed to ED for evaluation or call 911). 25 tablet 3   NOVOLOG FLEXPEN 100 UNIT/ML FlexPen INJECT 25-31 UNITS INTO THE SKIN THREE TIMES DAILY BEFORE MEALS (Patient taking differently: Inject 50 Units into the skin in the morning, at noon, and at bedtime.) 30 mL 0   No current facility-administered medications for this visit.   Facility-Administered Medications Ordered in Other Visits  Medication Dose Route Frequency Provider Last Rate Last Admin   regadenoson (LEXISCAN) injection SOLN 0.4 mg  0.4 mg Intravenous Once Chilton Si, MD       technetium tetrofosmin (TC-MYOVIEW) injection 32.8 millicurie  32.8 millicurie Intravenous Once PRN Chilton Si, MD         Physical Exam: Pulse (!) 58, resp. rate 20, height 6\' 5"  (1.956 m), weight 217 lb (98.4  kg), SpO2 96%.   NSR  wound now 2 cm narrow tract in  the location of the [facility] joint  Diagnostic Tests: none  Impression: L  joint slowly healing  Plan: Cont daily wet/dry dressing with Vashe Return for wound care in 2 days  Lovett Sox, MD [facility] [phone]

## 2022-09-19 ENCOUNTER — Ambulatory Visit: Payer: Medicare Other | Admitting: Cardiothoracic Surgery

## 2022-09-19 ENCOUNTER — Encounter: Payer: Self-pay | Admitting: Cardiothoracic Surgery

## 2022-09-19 VITALS — BP 140/68 | HR 87 | Resp 20

## 2022-09-19 DIAGNOSIS — Z4801 Encounter for change or removal of surgical wound dressing: Secondary | ICD-10-CM

## 2022-09-19 MED ORDER — CEPHALEXIN 500 MG PO CAPS: 500.0000 mg | ORAL_CAPSULE | Freq: Three times a day (TID) | ORAL | 0 refills | Status: DC

## 2022-09-19 NOTE — Progress Notes (Signed)
  HPI: The patient returns for wound care status post surgical debridement of a left sternoclavicular joint abscess.  The wound originally grew out Staph aureus.  The patient is placing a surgical dressing with Vashe wet-to-dry and 2 x 2 gauze.  Unfortunately the home wound care is not packing a sinus tract which extends approximately 1.5 cm into the upper sternal soft tissue.  Today I examined the wound.  There is some purulent material and the packing was on the superficial portion of the wound, not into the deep sinus tract.  I cleaned the wound and obtained culture.  The patient will be placed on oral antibiotics again.  I placed a silver nitrate stick into the sinus tract for cauterization and then placed a Vashe wet to dry 2 x 2 gauze into the sinus tract in the outer wound.  Will continue home care as well as clinic visits until this adequately heals.  The patient will resume 1 week course of oral Keflex.   Current Outpatient Medications  Medication Sig Dispense Refill   acetaminophen (TYLENOL) 500 MG tablet Take 1,000 mg by mouth every 6 (six) hours as needed for moderate pain.     amLODipine (NORVASC) 10 MG tablet Take 10 mg by mouth daily.     atorvastatin (LIPITOR) 80 MG tablet Take 1 tablet (80 mg total) by mouth daily. 30 tablet 3   carvedilol (COREG) 12.5 MG tablet Take 1 tablet (12.5 mg total) by mouth 2 (two) times daily. 30 tablet 3   insulin glargine (LANTUS) 100 UNIT/ML Solostar Pen Inject 70-75 Units into the skin at bedtime.     lisinopril (ZESTRIL) 10 MG tablet Take 1 tablet (10 mg total) by mouth daily. 30 tablet 3   Multiple Vitamins-Minerals (PRESERVISION AREDS 2) CAPS Take 1 capsule by mouth 2 (two) times daily.     nitroGLYCERIN (NITROSTAT) 0.4 MG SL tablet Place 1 tablet (0.4 mg total) under the tongue every 5 (five) minutes x 3 doses as needed for chest pain (if no relief after 3rd dose, proceed to ED for evaluation or call 911). 25 tablet 3   NOVOLOG FLEXPEN 100  UNIT/ML FlexPen INJECT 25-31 UNITS INTO THE SKIN THREE TIMES DAILY BEFORE MEALS (Patient taking differently: Inject 50 Units into the skin in the morning, at noon, and at bedtime.) 30 mL 0   No current facility-administered medications for this visit.   Facility-Administered Medications Ordered in Other Visits  Medication Dose Route Frequency Provider Last Rate Last Admin   regadenoson (LEXISCAN) injection SOLN 0.4 mg  0.4 mg Intravenous Once Chilton Si, MD       technetium tetrofosmin (TC-MYOVIEW) injection 32.8 millicurie  32.8 millicurie Intravenous Once PRN Chilton Si, MD        Physical Exam  Blood pressure (!) 140/68, pulse 87, resp. rate 20, SpO2 98%.  Lungs clear Sinus rhythm Healthy granulation tissue in the superficial part of the left sternoclavicular wound but some mucoid drainage from the deep sinus tract   Diagnostic Tests: Culture taken   Impression: Continue outpatient wound care, and oral antibiotic-Keflex  Plan: Return in 4 days for wound care.  Lovett Sox, MD [facility] [phone]

## 2022-09-20 DIAGNOSIS — M869 Osteomyelitis, unspecified: Secondary | ICD-10-CM | POA: Diagnosis not present

## 2022-09-20 DIAGNOSIS — M00012 Staphylococcal arthritis, left shoulder: Secondary | ICD-10-CM | POA: Diagnosis not present

## 2022-09-20 DIAGNOSIS — L02416 Cutaneous abscess of left lower limb: Secondary | ICD-10-CM | POA: Diagnosis not present

## 2022-09-20 DIAGNOSIS — B9561 Methicillin susceptible Staphylococcus aureus infection as the cause of diseases classified elsewhere: Secondary | ICD-10-CM | POA: Diagnosis not present

## 2022-09-20 DIAGNOSIS — E1169 Type 2 diabetes mellitus with other specified complication: Secondary | ICD-10-CM | POA: Diagnosis not present

## 2022-09-23 ENCOUNTER — Encounter: Payer: Self-pay | Admitting: Cardiothoracic Surgery

## 2022-09-23 ENCOUNTER — Ambulatory Visit: Payer: Medicare Other | Admitting: Cardiothoracic Surgery

## 2022-09-23 VITALS — BP 146/75 | HR 76 | Resp 20 | Ht 77.0 in | Wt 217.0 lb

## 2022-09-23 DIAGNOSIS — I700 Atherosclerosis of aorta: Secondary | ICD-10-CM | POA: Diagnosis not present

## 2022-09-23 DIAGNOSIS — Z0000 Encounter for general adult medical examination without abnormal findings: Secondary | ICD-10-CM | POA: Diagnosis not present

## 2022-09-23 DIAGNOSIS — Z951 Presence of aortocoronary bypass graft: Secondary | ICD-10-CM

## 2022-09-23 DIAGNOSIS — I509 Heart failure, unspecified: Secondary | ICD-10-CM | POA: Diagnosis not present

## 2022-09-23 DIAGNOSIS — M009 Pyogenic arthritis, unspecified: Secondary | ICD-10-CM

## 2022-09-23 DIAGNOSIS — Z4801 Encounter for change or removal of surgical wound dressing: Secondary | ICD-10-CM

## 2022-09-23 DIAGNOSIS — I10 Essential (primary) hypertension: Secondary | ICD-10-CM | POA: Diagnosis not present

## 2022-09-23 DIAGNOSIS — Z299 Encounter for prophylactic measures, unspecified: Secondary | ICD-10-CM | POA: Diagnosis not present

## 2022-09-23 MED ORDER — CARVEDILOL 12.5 MG PO TABS: 12.5000 mg | ORAL_TABLET | Freq: Two times a day (BID) | ORAL | 3 refills | Status: DC

## 2022-09-23 NOTE — Progress Notes (Signed)
HPI: The patient returns for wound care after debridement of left sternoclavicular joint infection with MSSA. He has developed a small sinus tract which continues to drain. He has been placed back on oral antibiotics (Keflex) His family is unable to pack the wound because of the narrow sinus tract. I cleaned out the wound with Vashe solution, applied silver nitrate and packed with a 2 x 2 gauze soaked in Vashe.  Will have the patient come to the office daily to get proper wound care so that the wound can be packed to the very depth of the sinus tract to allow best chance for healing.  Current Outpatient Medications  Medication Sig Dispense Refill   acetaminophen (TYLENOL) 500 MG tablet Take 1,000 mg by mouth every 6 (six) hours as needed for moderate pain.     amLODipine (NORVASC) 10 MG tablet Take 10 mg by mouth daily.     atorvastatin (LIPITOR) 80 MG tablet Take 1 tablet (80 mg total) by mouth daily. 30 tablet 3   cephALEXin (KEFLEX) 500 MG capsule Take 1 capsule (500 mg total) by mouth 3 (three) times daily. 21 capsule 0   insulin glargine (LANTUS) 100 UNIT/ML Solostar Pen Inject 70-75 Units into the skin at bedtime.     lisinopril (ZESTRIL) 10 MG tablet Take 1 tablet (10 mg total) by mouth daily. 30 tablet 3   Multiple Vitamins-Minerals (PRESERVISION AREDS 2) CAPS Take 1 capsule by mouth 2 (two) times daily.     nitroGLYCERIN (NITROSTAT) 0.4 MG SL tablet Place 1 tablet (0.4 mg total) under the tongue every 5 (five) minutes x 3 doses as needed for chest pain (if no relief after 3rd dose, proceed to ED for evaluation or call 911). 25 tablet 3   NOVOLOG FLEXPEN 100 UNIT/ML FlexPen INJECT 25-31 UNITS INTO THE SKIN THREE TIMES DAILY BEFORE MEALS (Patient taking differently: Inject 50 Units into the skin in the morning, at noon, and at bedtime.) 30 mL 0   carvedilol (COREG) 12.5 MG tablet Take 1 tablet (12.5 mg total) by mouth 2 (two) times daily. 30 tablet 3   No current facility-administered  medications for this visit.   Facility-Administered Medications Ordered in Other Visits  Medication Dose Route Frequency Provider Last Rate Last Admin   regadenoson (LEXISCAN) injection SOLN 0.4 mg  0.4 mg Intravenous Once Chilton Si, MD       technetium tetrofosmin (TC-MYOVIEW) injection 32.8 millicurie  32.8 millicurie Intravenous Once PRN Chilton Si, MD         Physical Exam: Blood pressure (!) 146/75, pulse 76, resp. rate 20, height 6\' 5"  (1.956 m), weight 217 lb (98.4 kg), SpO2 97%.  Wound packed with a 2 x 2 gauze, approximately 2 cm deep tract. Heart rate regular Lungs clear Some skin irritation from the tape around the wound but no cellulitis Neuro intact  Diagnostic Tests: None  Impression: Sternoclavicular wound with delayed healing due to development of the sinus tract which has been difficult for the family to pack with wet-to-dry dressings.  Plan: Continue Keflex antibiotic coverage We will have the patient come to the office daily for proper wound packing this week Coreg has been reordered to help with manage his blood pressure  Return to clinic tomorrow for wound care Lovett Sox, MD [facility] [phone]

## 2022-09-24 ENCOUNTER — Ambulatory Visit (INDEPENDENT_AMBULATORY_CARE_PROVIDER_SITE_OTHER): Payer: Self-pay | Admitting: Cardiothoracic Surgery

## 2022-09-24 ENCOUNTER — Other Ambulatory Visit: Payer: Self-pay

## 2022-09-24 ENCOUNTER — Encounter: Payer: Self-pay | Admitting: Cardiothoracic Surgery

## 2022-09-24 ENCOUNTER — Other Ambulatory Visit: Payer: Self-pay | Admitting: *Deleted

## 2022-09-24 VITALS — BP 170/88 | HR 84 | Resp 20 | Ht 77.0 in | Wt 217.0 lb

## 2022-09-24 DIAGNOSIS — M009 Pyogenic arthritis, unspecified: Secondary | ICD-10-CM

## 2022-09-24 DIAGNOSIS — Z951 Presence of aortocoronary bypass graft: Secondary | ICD-10-CM

## 2022-09-24 DIAGNOSIS — Z4801 Encounter for change or removal of surgical wound dressing: Secondary | ICD-10-CM

## 2022-09-24 DIAGNOSIS — R895 Abnormal microbiological findings in specimens from other organs, systems and tissues: Secondary | ICD-10-CM | POA: Insufficient documentation

## 2022-09-24 NOTE — Progress Notes (Signed)
Wound CX sent to [facility]

## 2022-09-24 NOTE — Progress Notes (Signed)
HPI: The patient presents for wound care and wound culture. The left sternoclavicular wound has had increased drainage. Previous cultures have grown out MSSA and the patient was restarted on oral antibiotics.  Because of progression of the wound and a 1.5 cm sinus tract which has been suboptimally packed because of the anatomy a repeat surgical debridement for better exposure of the depth of the wound will be needed.  A repeat wound culture was taken today as well to identify any change in the organism involved.   Today the wound is draining some mucoid material. I cleaned the wound with Vashe solution after the culture and placed a 2 x 2 wet-to-dry Vashe packing.  Patient states he is taking his insulin as directed by his primary care physician.  He denies fever.  In order to get better control of the infection he will need repeat debridement mainly of the superficial tissues which are indurated, causing narrowing of the superficial soft tissue and impeding adequate packing of the depth of the wound.  Current Outpatient Medications  Medication Sig Dispense Refill   acetaminophen (TYLENOL) 500 MG tablet Take 1,000 mg by mouth every 6 (six) hours as needed for moderate pain.     amLODipine (NORVASC) 10 MG tablet Take 10 mg by mouth daily.     atorvastatin (LIPITOR) 80 MG tablet Take 1 tablet (80 mg total) by mouth daily. 30 tablet 3   carvedilol (COREG) 12.5 MG tablet Take 1 tablet (12.5 mg total) by mouth 2 (two) times daily. 30 tablet 3   cephALEXin (KEFLEX) 500 MG capsule Take 1 capsule (500 mg total) by mouth 3 (three) times daily. 21 capsule 0   insulin glargine (LANTUS) 100 UNIT/ML Solostar Pen Inject 70-75 Units into the skin at bedtime.     lisinopril (ZESTRIL) 10 MG tablet Take 1 tablet (10 mg total) by mouth daily. 30 tablet 3   Multiple Vitamins-Minerals (PRESERVISION AREDS 2) CAPS Take 1 capsule by mouth 2 (two) times daily.     nitroGLYCERIN (NITROSTAT) 0.4 MG SL tablet Place 1  tablet (0.4 mg total) under the tongue every 5 (five) minutes x 3 doses as needed for chest pain (if no relief after 3rd dose, proceed to ED for evaluation or call 911). 25 tablet 3   NOVOLOG FLEXPEN 100 UNIT/ML FlexPen INJECT 25-31 UNITS INTO THE SKIN THREE TIMES DAILY BEFORE MEALS (Patient taking differently: Inject 50 Units into the skin in the morning, at noon, and at bedtime.) 30 mL 0   No current facility-administered medications for this visit.   Facility-Administered Medications Ordered in Other Visits  Medication Dose Route Frequency Provider Last Rate Last Admin   regadenoson (LEXISCAN) injection SOLN 0.4 mg  0.4 mg Intravenous Once Chilton Si, MD       technetium tetrofosmin (TC-MYOVIEW) injection 32.8 millicurie  32.8 millicurie Intravenous Once PRN Chilton Si, MD         Physical Exam: Blood pressure (!) 170/88, pulse 84, resp. rate 20, height 6\' 5"  (1.956 m), weight 217 lb (98.4 kg), SpO2 97%.  Alert and comfortable Erythema around the opening of the wound with mucoid drainage which was cultured Lungs clear Heart rate regular Neuro intact  The wound was repacked with 2 x 2 gauze soaked in Vashe wound solution.  Diagnostic Tests: Wound culture for aerobic-anaerobic organisms sent to microbiology  Impression: Left sternoclavicular joint infection, previous MSSA positive culture.  Now with worsening of the wound with increased drainage and decreased access to adequately packed the  depth of the wound  Plan: Patient will be brought to the hospital for outpatient debridement of the left sternoclavicular joint wound under general anesthesia.  The procedure benefits and risks were discussed with the patient and his daughter.   Lovett Sox, MD [facility] [phone]

## 2022-09-25 ENCOUNTER — Other Ambulatory Visit: Payer: Self-pay

## 2022-09-25 DIAGNOSIS — S21109A Unspecified open wound of unspecified front wall of thorax without penetration into thoracic cavity, initial encounter: Secondary | ICD-10-CM | POA: Diagnosis not present

## 2022-09-26 ENCOUNTER — Other Ambulatory Visit: Payer: Self-pay

## 2022-09-26 ENCOUNTER — Ambulatory Visit (HOSPITAL_BASED_OUTPATIENT_CLINIC_OR_DEPARTMENT_OTHER): Payer: Medicare Other | Admitting: Certified Registered Nurse Anesthetist

## 2022-09-26 ENCOUNTER — Ambulatory Visit (HOSPITAL_COMMUNITY): Payer: Medicare Other | Admitting: Certified Registered Nurse Anesthetist

## 2022-09-26 ENCOUNTER — Other Ambulatory Visit: Payer: Self-pay | Admitting: Cardiothoracic Surgery

## 2022-09-26 ENCOUNTER — Ambulatory Visit (HOSPITAL_COMMUNITY)
Admission: RE | Admit: 2022-09-26 | Discharge: 2022-09-26 | Disposition: A | Discharge status: Home / Self Care | Payer: Medicare Other | Attending: Cardiothoracic Surgery | Admitting: Cardiothoracic Surgery

## 2022-09-26 ENCOUNTER — Encounter (HOSPITAL_COMMUNITY): Payer: Self-pay | Admitting: Cardiothoracic Surgery

## 2022-09-26 ENCOUNTER — Encounter (HOSPITAL_COMMUNITY)
Admission: RE | Disposition: A | Discharge status: Home / Self Care | Payer: Self-pay | Source: Home / Self Care | Attending: Cardiothoracic Surgery

## 2022-09-26 ENCOUNTER — Ambulatory Visit (HOSPITAL_COMMUNITY): Payer: Medicare Other

## 2022-09-26 DIAGNOSIS — I252 Old myocardial infarction: Secondary | ICD-10-CM | POA: Diagnosis not present

## 2022-09-26 DIAGNOSIS — I2510 Atherosclerotic heart disease of native coronary artery without angina pectoris: Secondary | ICD-10-CM | POA: Diagnosis not present

## 2022-09-26 DIAGNOSIS — E119 Type 2 diabetes mellitus without complications: Secondary | ICD-10-CM | POA: Diagnosis not present

## 2022-09-26 DIAGNOSIS — Z87891 Personal history of nicotine dependence: Secondary | ICD-10-CM | POA: Insufficient documentation

## 2022-09-26 DIAGNOSIS — B9561 Methicillin susceptible Staphylococcus aureus infection as the cause of diseases classified elsewhere: Secondary | ICD-10-CM | POA: Diagnosis not present

## 2022-09-26 DIAGNOSIS — Z951 Presence of aortocoronary bypass graft: Secondary | ICD-10-CM | POA: Insufficient documentation

## 2022-09-26 DIAGNOSIS — M01X19 Direct infection of unspecified shoulder in infectious and parasitic diseases classified elsewhere: Secondary | ICD-10-CM | POA: Diagnosis not present

## 2022-09-26 DIAGNOSIS — M86612 Other chronic osteomyelitis, left shoulder: Secondary | ICD-10-CM

## 2022-09-26 DIAGNOSIS — I10 Essential (primary) hypertension: Secondary | ICD-10-CM | POA: Diagnosis not present

## 2022-09-26 DIAGNOSIS — E1165 Type 2 diabetes mellitus with hyperglycemia: Secondary | ICD-10-CM | POA: Diagnosis not present

## 2022-09-26 DIAGNOSIS — K219 Gastro-esophageal reflux disease without esophagitis: Secondary | ICD-10-CM | POA: Diagnosis not present

## 2022-09-26 DIAGNOSIS — M009 Pyogenic arthritis, unspecified: Secondary | ICD-10-CM | POA: Diagnosis not present

## 2022-09-26 DIAGNOSIS — Z01818 Encounter for other preprocedural examination: Secondary | ICD-10-CM | POA: Diagnosis not present

## 2022-09-26 DIAGNOSIS — M0000 Staphylococcal arthritis, unspecified joint: Secondary | ICD-10-CM | POA: Diagnosis present

## 2022-09-26 HISTORY — PX: WOUND EXPLORATION: SHX6188

## 2022-09-26 LAB — CBC
HCT: 35 % — ABNORMAL LOW (ref 39.0–52.0)
Hemoglobin: 11.1 g/dL — ABNORMAL LOW (ref 13.0–17.0)
MCH: 26.4 pg (ref 26.0–34.0)
MCHC: 31.7 g/dL (ref 30.0–36.0)
MCV: 83.1 fL (ref 80.0–100.0)
Platelets: 204 10*3/uL (ref 150–400)
RBC: 4.21 MIL/uL — ABNORMAL LOW (ref 4.22–5.81)
RDW: 14 % (ref 11.5–15.5)
WBC: 5.1 10*3/uL (ref 4.0–10.5)
nRBC: 0 % (ref 0.0–0.2)

## 2022-09-26 LAB — COMPREHENSIVE METABOLIC PANEL
ALT: 12 U/L (ref 0–44)
AST: 17 U/L (ref 15–41)
Albumin: 3.3 g/dL — ABNORMAL LOW (ref 3.5–5.0)
Alkaline Phosphatase: 92 U/L (ref 38–126)
Anion gap: 9 (ref 5–15)
BUN: 11 mg/dL (ref 8–23)
CO2: 24 mmol/L (ref 22–32)
Calcium: 8.7 mg/dL — ABNORMAL LOW (ref 8.9–10.3)
Chloride: 103 mmol/L (ref 98–111)
Creatinine, Ser: 0.77 mg/dL (ref 0.61–1.24)
GFR, Estimated: >60 mL/min (ref >60)
Glucose, Bld: 278 mg/dL — ABNORMAL HIGH (ref 70–99)
Potassium: 4 mmol/L (ref 3.5–5.1)
Sodium: 136 mmol/L (ref 135–145)
Total Bilirubin: 0.7 mg/dL (ref 0.3–1.2)
Total Protein: 6.5 g/dL (ref 6.5–8.1)

## 2022-09-26 LAB — SURGICAL PCR SCREEN
MRSA, PCR: NEGATIVE
Staphylococcus aureus: POSITIVE — AB

## 2022-09-26 LAB — GLUCOSE, CAPILLARY
Glucose-Capillary: 282 mg/dL — ABNORMAL HIGH (ref 70–99)
Glucose-Capillary: 183 mg/dL — ABNORMAL HIGH (ref 70–99)

## 2022-09-26 SURGERY — WOUND EXPLORATION
Anesthesia: General | Laterality: Left

## 2022-09-26 MED ORDER — PROPOFOL 10 MG/ML IV BOLUS
INTRAVENOUS | Status: DC | PRN
  Administered 2022-09-26: 200 mg via INTRAVENOUS

## 2022-09-26 MED ORDER — DEXAMETHASONE SODIUM PHOSPHATE 10 MG/ML IJ SOLN
INTRAMUSCULAR | Status: DC | PRN
  Administered 2022-09-26: 5 mg via INTRAVENOUS

## 2022-09-26 MED ORDER — LACTATED RINGERS IV SOLN: INTRAVENOUS | Status: DC

## 2022-09-26 MED ORDER — ACETAMINOPHEN 650 MG RE SUPP: 650.0000 mg | RECTAL | Status: DC | PRN

## 2022-09-26 MED ORDER — LIDOCAINE 2% (20 MG/ML) 5 ML SYRINGE
INTRAMUSCULAR | Status: DC | PRN
  Administered 2022-09-26: 60 mg via INTRAVENOUS

## 2022-09-26 MED ORDER — 0.9 % SODIUM CHLORIDE (POUR BTL) OPTIME
TOPICAL | Status: DC | PRN
Start: 2022-09-26 — End: 2022-09-26
  Administered 2022-09-26: 1000 mL

## 2022-09-26 MED ORDER — OXYCODONE HCL 5 MG PO TABS: 5.0000 mg | ORAL_TABLET | ORAL | Status: DC | PRN

## 2022-09-26 MED ORDER — ORAL CARE MOUTH RINSE: 15.0000 mL | Freq: Once | OROMUCOSAL | Status: AC

## 2022-09-26 MED ORDER — PHENYLEPHRINE 80 MCG/ML (10ML) SYRINGE FOR IV PUSH (FOR BLOOD PRESSURE SUPPORT)
PREFILLED_SYRINGE | INTRAVENOUS | Status: DC | PRN
  Administered 2022-09-26 (×4): 160 ug via INTRAVENOUS

## 2022-09-26 MED ORDER — FENTANYL CITRATE (PF) 100 MCG/2ML IJ SOLN: 25.0000 ug | INTRAMUSCULAR | Status: DC | PRN

## 2022-09-26 MED ORDER — FENTANYL CITRATE (PF) 250 MCG/5ML IJ SOLN
INTRAMUSCULAR | Status: DC | PRN
  Administered 2022-09-26: 100 ug via INTRAVENOUS
  Administered 2022-09-26: 50 ug via INTRAVENOUS

## 2022-09-26 MED ORDER — SODIUM CHLORIDE 0.9% FLUSH: 3.0000 mL | Freq: Two times a day (BID) | INTRAVENOUS | Status: DC

## 2022-09-26 MED ORDER — SODIUM CHLORIDE 0.9 % IV SOLN: 250.0000 mL | INTRAVENOUS | Status: DC | PRN

## 2022-09-26 MED ORDER — ACETAMINOPHEN 500 MG PO TABS
ORAL_TABLET | ORAL | Status: AC
  Filled 2022-09-26: qty 2

## 2022-09-26 MED ORDER — CHLORHEXIDINE GLUCONATE 0.12 % MT SOLN
15.0000 mL | Freq: Once | OROMUCOSAL | Status: AC
  Administered 2022-09-26: 15 mL via OROMUCOSAL
  Filled 2022-09-26: qty 15

## 2022-09-26 MED ORDER — PROPOFOL 10 MG/ML IV BOLUS
INTRAVENOUS | Status: AC
  Filled 2022-09-26: qty 20

## 2022-09-26 MED ORDER — VASHE WOUND IRRIGATION OPTIME
TOPICAL | Status: DC | PRN
  Administered 2022-09-26: 34 [oz_av]

## 2022-09-26 MED ORDER — INSULIN ASPART 100 UNIT/ML IJ SOLN
0.0000 [IU] | INTRAMUSCULAR | Status: AC | PRN
  Administered 2022-09-26: 2 [IU] via SUBCUTANEOUS

## 2022-09-26 MED ORDER — ROCURONIUM BROMIDE 10 MG/ML (PF) SYRINGE
PREFILLED_SYRINGE | INTRAVENOUS | Status: DC | PRN
  Administered 2022-09-26: 20 mg via INTRAVENOUS
  Administered 2022-09-26: 50 mg via INTRAVENOUS

## 2022-09-26 MED ORDER — DROPERIDOL 2.5 MG/ML IJ SOLN: 0.6250 mg | Freq: Once | INTRAMUSCULAR | Status: DC | PRN

## 2022-09-26 MED ORDER — OXYCODONE HCL 5 MG PO TABS
5.0000 mg | ORAL_TABLET | ORAL | 0 refills | Status: DC | PRN
Start: 2022-09-26 — End: 2022-10-07

## 2022-09-26 MED ORDER — ACETAMINOPHEN 325 MG PO TABS: 650.0000 mg | ORAL_TABLET | ORAL | Status: DC | PRN

## 2022-09-26 MED ORDER — INSULIN ASPART 100 UNIT/ML IJ SOLN
INTRAMUSCULAR | Status: AC
  Administered 2022-09-26: 4 [IU] via SUBCUTANEOUS
  Filled 2022-09-26: qty 1

## 2022-09-26 MED ORDER — SUGAMMADEX SODIUM 200 MG/2ML IV SOLN
INTRAVENOUS | Status: DC | PRN
  Administered 2022-09-26: 200.4 mg via INTRAVENOUS

## 2022-09-26 MED ORDER — CEFAZOLIN SODIUM-DEXTROSE 2-3 GM-%(50ML) IV SOLR
INTRAVENOUS | Status: DC | PRN
Start: 2022-09-26 — End: 2022-09-26
  Administered 2022-09-26: 2 g via INTRAVENOUS

## 2022-09-26 MED ORDER — FENTANYL CITRATE (PF) 250 MCG/5ML IJ SOLN
INTRAMUSCULAR | Status: AC
  Filled 2022-09-26: qty 5

## 2022-09-26 MED ORDER — EPHEDRINE SULFATE-NACL 50-0.9 MG/10ML-% IV SOSY
PREFILLED_SYRINGE | INTRAVENOUS | Status: DC | PRN
  Administered 2022-09-26: 10 mg via INTRAVENOUS

## 2022-09-26 MED ORDER — ACETAMINOPHEN 500 MG PO TABS
1000.0000 mg | ORAL_TABLET | Freq: Once | ORAL | Status: AC
  Administered 2022-09-26: 1000 mg via ORAL

## 2022-09-26 MED ORDER — ONDANSETRON HCL 4 MG/2ML IJ SOLN
INTRAMUSCULAR | Status: DC | PRN
  Administered 2022-09-26: 4 mg via INTRAVENOUS

## 2022-09-26 MED ORDER — SODIUM CHLORIDE 0.9% FLUSH: 3.0000 mL | INTRAVENOUS | Status: DC | PRN

## 2022-09-26 SURGICAL SUPPLY — 28 items
BLADE CLIPPER SURG (BLADE) ×1 IMPLANT
CANISTER SUCT 3000ML PPV (MISCELLANEOUS) ×1 IMPLANT
CLEANSER WND VASHE INSTL 34OZ (WOUND CARE) IMPLANT
COVER SURGICAL LIGHT HANDLE (MISCELLANEOUS) ×2 IMPLANT
DRAPE CHEST BREAST 15X10 FENES (DRAPES) IMPLANT
DRSG CUTIMED SORBACT 7X9 (GAUZE/BANDAGES/DRESSINGS) IMPLANT
DRSG TEGADERM 4X4.75 (GAUZE/BANDAGES/DRESSINGS) IMPLANT
ELECT REM PT RETURN 9FT ADLT (ELECTROSURGICAL) ×1
ELECTRODE REM PT RTRN 9FT ADLT (ELECTROSURGICAL) ×1 IMPLANT
GAUZE 4X4 16PLY ~~LOC~~+RFID DBL (SPONGE) ×1 IMPLANT
GAUZE PAD ABD 8X10 STRL (GAUZE/BANDAGES/DRESSINGS) ×1 IMPLANT
GAUZE SPONGE 4X4 12PLY STRL (GAUZE/BANDAGES/DRESSINGS) ×1 IMPLANT
GLOVE SURG SIGNA 7.5 PF LTX (GLOVE) ×1 IMPLANT
GOWN STRL REUS W/ TWL LRG LVL3 (GOWN DISPOSABLE) ×2 IMPLANT
GOWN STRL REUS W/TWL LRG LVL3 (GOWN DISPOSABLE) ×2
HANDPIECE INTERPULSE COAX TIP (DISPOSABLE) ×1
KIT BASIN OR (CUSTOM PROCEDURE TRAY) ×1 IMPLANT
KIT TURNOVER KIT B (KITS) ×1 IMPLANT
NS IRRIG 1000ML POUR BTL (IV SOLUTION) ×1 IMPLANT
PACK GENERAL/GYN (CUSTOM PROCEDURE TRAY) ×1 IMPLANT
PAD ARMBOARD 7.5X6 YLW CONV (MISCELLANEOUS) ×2 IMPLANT
POWDER MYRIAD MORCLLS FINE 500 (Miscellaneous) IMPLANT
PWDR MYRIAD MORCELLS FINE 500 (Miscellaneous) ×1 IMPLANT
SET HNDPC FAN SPRY TIP SCT (DISPOSABLE) IMPLANT
SUT VIC AB 3-0 SH 8-18 (SUTURE) IMPLANT
TOWEL GREEN STERILE (TOWEL DISPOSABLE) ×1 IMPLANT
TOWEL GREEN STERILE FF (TOWEL DISPOSABLE) ×1 IMPLANT
WATER STERILE IRR 1000ML POUR (IV SOLUTION) ×1 IMPLANT

## 2022-09-26 NOTE — Op Note (Signed)
NAMETAUREN, George [name] DATE OF BIRTH: 09-09-1945 FACILITY: [facility] LOCATION: [facility] PHYSICIAN: Kerin Perna III, MD  Operative Report   DATE OF PROCEDURE: 09/26/2022  OPERATION:   1.  Excisional debridement of left sternoclavicular joint wound-infection. 2.  Wound irrigation with Vashe solution. 3.  Application of wound matrix material Myriad Morcells 500 mg.  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  ANESTHESIA:  General.  CLINICAL NOTE:  The patient was checked in preoperative holding where informed consent was documented and the patient was reassessed and discussion was reviewed of the plans for surgery including the benefits and risks.  The patient was recommended for  surgery because of lack of progress and wound healing of the sternoclavicular joint infection with a persistent sinus tract and drainage.  The cultures have been no growth and I felt the best action going forward was repeat surgical debridement to allow  for successful healing.  He understood the risks of bleeding and recurrent infection.  DESCRIPTION OF PROCEDURE:  The patient was brought back to the OR by the OR team and was met by anesthesia.  The patient was positioned on the OR table and IV medication was started and the patient was induced for anesthesia and intubated.  He remained  stable.  The left upper chest was prepped and draped as a sterile field.  A proper timeout was performed.  An incision was made around the previous incision to remove the indurated chronically inflamed tissues.  The incision was taken down to the chest wall and to the sternum and clavicular head.  There was a cavity underneath the chest wall granulation  tissue consisting of slimy unhealthy tissue consisting of  fascia and an exudate over the clavicular head and synovial membrane.  This was all sharply debrided back to healthy tissue irrigated with a liter of Vashe wound irrigation with pulse lavaged.   Then, after hemostasis was achieved 500 mg of Myriad Morcells was applied to the base of the wound and covered with a membrane of the Sorbact, which was positioned with interrupted 3-0 Vicryl sutures.  Over this Vashe wet-to-dry 4 x 4 gauze was placed  and a sterile dressing.  The patient reversed from anesthesia and returned to recovery room in stable condition.  Blood loss was approximately 10 mL.   PUS D: 09/26/2022 2:44:17 pm T: 09/26/2022 2:58:00 pm  JOB: [id number]/ [id number]

## 2022-09-26 NOTE — Brief Op Note (Signed)
09/26/2022  2:22 PM  PATIENT:  George [name]  77 y.o. male  PRE-OPERATIVE DIAGNOSIS:  STERNOCLAVICULAR JOINT ABSCESS  POST-OPERATIVE DIAGNOSIS:  STERNOCLAVICULAR JOINT ABSCESS  PROCEDURE:  Procedure(s): DEBRIDEMENT LEFT STERNOCLAVICULAR JOINT (Left) Excisional debridement of fascia, bone,synovium, fat Application of Myriad Morcells 500mg   SURGEON:  Surgeons and Role:    Lovett Sox, MD - Primary  PHYSICIAN ASSISTANT: na  ASSISTANTS: RN   ANESTHESIA:   general  EBL: 10 ml  BLOOD ADMINISTERED:none  DRAINS: none   LOCAL MEDICATIONS USED:  NONE  SPECIMEN:  Excision  DISPOSITION OF SPECIMEN:  PATHOLOGY  COUNTS:  YES  TOURNIQUET:  * No tourniquets in log *  DICTATION: .Dragon Dictation  PLAN OF CARE: Discharge to home after PACU  PATIENT DISPOSITION:  PACU - hemodynamically stable.   Delay start of Pharmacological VTE agent (>24hrs) due to surgical blood loss or risk of bleeding: yes

## 2022-09-26 NOTE — Discharge Instructions (Addendum)
Do not change dressing until you see MD in office [address] for 12 hours, do not shower Resume diabetic diet Change Lantus insulin dosing to 40 units at 8 am and 8 pm daily Come to Dr Donata Clay office 1 p tomorrow 9-13 for wound care

## 2022-09-26 NOTE — Anesthesia Postprocedure Evaluation (Signed)
Anesthesia Post Note  Patient: George [name]  Procedure(s) Performed: DEBRIDEMENT LEFT STERNOCLAVICULAR JOINT (Left)     Patient location during evaluation: PACU Anesthesia Type: General Level of consciousness: awake and alert Pain management: pain level controlled Vital Signs Assessment: post-procedure vital signs reviewed and stable Respiratory status: spontaneous breathing, nonlabored ventilation, respiratory function stable and patient connected to nasal cannula oxygen Cardiovascular status: blood pressure returned to baseline and stable Postop Assessment: no apparent nausea or vomiting Anesthetic complications: no  No notable events documented.  Last Vitals:  Vitals:   09/26/22 1445 09/26/22 1500  BP: (!) 174/86 (!) 168/99  Pulse: 86 82  Resp: 19 19  Temp:    SpO2: 94% 96%    Last Pain:  Vitals:   09/26/22 1445  TempSrc:   PainSc: 5                  Kennieth Rad

## 2022-09-26 NOTE — Transfer of Care (Signed)
Immediate Anesthesia Transfer of Care Note  Patient: SUPREME CEFALO  Procedure(s) Performed: DEBRIDEMENT LEFT STERNOCLAVICULAR JOINT (Left)  Patient Location: PACU  Anesthesia Type:General  Level of Consciousness: awake, alert , and oriented  Airway & Oxygen Therapy: Patient Spontanous Breathing and Patient connected to nasal cannula oxygen  Post-op Assessment: Report given to RN and Post -op Vital signs reviewed and stable  Post vital signs: Reviewed and stable  Last Vitals:  Vitals Value Taken Time  BP 159/80 09/26/22 1420  Temp 98   Pulse 87 09/26/22 1426  Resp 14 09/26/22 1426  SpO2 99 % 09/26/22 1426  Vitals shown include unfiled device data.  Last Pain:  Vitals:   09/26/22 1125  TempSrc:   PainSc: 0-No pain         Complications: No notable events documented.

## 2022-09-26 NOTE — Anesthesia Procedure Notes (Signed)
Procedure Name: Intubation Date/Time: 09/26/2022 1:15 PM  Performed by: Darryl Nestle, CRNAPre-anesthesia Checklist: Patient identified, Emergency Drugs available, Suction available and Patient being monitored Patient Re-evaluated:Patient Re-evaluated prior to induction Oxygen Delivery Method: Circle system utilized Preoxygenation: Pre-oxygenation with 100% oxygen Induction Type: IV induction Ventilation: Mask ventilation without difficulty Laryngoscope Size: Mac, Glidescope and 3 Grade View: Grade I Tube type: Oral Tube size: 7.0 mm Number of attempts: 2 Airway Equipment and Method: Stylet and Oral airway Placement Confirmation: ETT inserted through vocal cords under direct vision, positive ETCO2 and breath sounds checked- equal and bilateral Secured at: 23 cm Tube secured with: Tape Dental Injury: Teeth and Oropharynx as per pre-operative assessment  Comments: Attempt 1: Grade 4 view with mac 4 blade Attempt 2: grade 1 view with Glidescope Mac 3

## 2022-09-26 NOTE — H&P (Signed)
HPI: The patient presents for wound care and wound culture. The left sternoclavicular wound has had increased drainage. Previous cultures have grown out MSSA and the patient was restarted on oral antibiotics.  Because of progression of the wound and a 1.5 cm sinus tract which has been suboptimally packed because of the anatomy a repeat surgical debridement for better exposure of the depth of the wound will be needed.  A repeat wound culture was taken today as well to identify any change in the organism involved.   Today the wound is draining some mucoid material. I cleaned the wound with Vashe solution after the culture and placed a 2 x 2 wet-to-dry Vashe packing.  Patient states he is taking his insulin as directed by his primary care physician.  He denies fever.  In order to get better control of the infection he will need repeat debridement mainly of the superficial tissues which are indurated, causing narrowing of the superficial soft tissue and impeding adequate packing of the depth of the wound.  Current Facility-Administered Medications  Medication Dose Route Frequency Provider Last Rate Last Admin   acetaminophen (TYLENOL) tablet 1,000 mg  1,000 mg Oral Once Marcene Duos, MD       insulin aspart (novoLOG) injection 0-7 Units  0-7 Units Subcutaneous Q2H PRN Bristol Nation, MD   4 Units at 09/26/22 1130   lactated ringers infusion   Intravenous Continuous Crane Nation, MD 10 mL/hr at 09/26/22 1145 New Bag at 09/26/22 1145   Facility-Administered Medications Ordered in Other Encounters  Medication Dose Route Frequency Provider Last Rate Last Admin   regadenoson (LEXISCAN) injection SOLN 0.4 mg  0.4 mg Intravenous Once Chilton Si, MD       technetium tetrofosmin (TC-MYOVIEW) injection 32.8 millicurie  32.8 millicurie Intravenous Once PRN Chilton Si, MD        PMHx   Hx CABG DM  HTN Hyperlipidemia Hx prostate cancer  Physical Exam: Blood pressure (!) 170/88,  pulse 84, resp. rate 20, height 6\' 5"  (1.956 m), weight 217 lb (98.4 kg), SpO2 97%.  Alert and comfortable Erythema around the opening of the wound with mucoid drainage which was cultured Lungs clear Heart rate regular Neuro intact  The wound was repacked with 2 x 2 gauze soaked in Vashe wound solution.  Diagnostic Tests: Wound culture for aerobic-anaerobic organisms sent to microbiology  Impression: Left sternoclavicular joint infection, previous MSSA positive culture.  Now with worsening of the wound with increased drainage and decreased access to adequately packed the depth of the wound  Plan: Patient will be brought to the hospital for outpatient debridement of the left sternoclavicular joint wound under general anesthesia.  The procedure benefits and risks were discussed with the patient and his daughter.   Lovett Sox, MD [facility] [phone]  09-26-22 Update  Wound culture taken in office no growth.  Plan repeat debridement of Left sternoclavicular joint wound for increased drainage and lack of progression in healing with persistent 2 cm sinus tract which needs surgical therapy Procedure discussed with patient and benefits and risks [ bleeding, persistent infection] reviewed with patient.  Pre Procedure note for inpatients:   George [name] has been scheduled for Procedure(s): DEBRIDEMENT LEFT STERNOCLAVICULAR JOINT (Left) today. The various methods of treatment have been discussed with the patient. After consideration of the risks, benefits and treatment options the patient has consented to the planned procedure.   The patient has been seen and labs reviewed. There are  no changes in the patient's condition to prevent proceeding with the planned procedure today.  Recent labs:  Lab Results  Component Value Date   WBC 5.1 09/26/2022   HGB 11.1 (L) 09/26/2022   HCT 35.0 (L) 09/26/2022   PLT 204 09/26/2022   GLUCOSE 174 (H) 07/29/2022    CHOL 144 06/06/2022   TRIG 285 (H) 06/06/2022   HDL 37 (L) 06/06/2022   LDLCALC 50 06/06/2022   ALT 11 07/25/2022   AST 11 (L) 07/25/2022   NA 133 (L) 07/29/2022   K 3.7 07/29/2022   CL 98 07/29/2022   CREATININE 0.66 07/29/2022   BUN 10 07/29/2022   CO2 26 07/29/2022   TSH 2.92 04/23/2017   INR 1.1 07/22/2022   HGBA1C 8.6 (H) 06/06/2022    Lovett Sox, MD 09/26/2022 12:38 PM

## 2022-09-26 NOTE — Anesthesia Preprocedure Evaluation (Addendum)
Anesthesia Evaluation  Patient identified by MRN, date of birth, ID band Patient awake    Reviewed: Allergy & Precautions, H&P , NPO status , Patient's Chart, lab work & pertinent test results  Airway Mallampati: II  TM Distance: >3 FB Neck ROM: Full    Dental no notable dental hx.    Pulmonary former smoker   Pulmonary exam normal breath sounds clear to auscultation       Cardiovascular hypertension, + CAD and + Past MI  Normal cardiovascular exam Rhythm:Regular Rate:Normal  S/p cabg   Neuro/Psych negative neurological ROS  negative psych ROS   GI/Hepatic Neg liver ROS,GERD  ,,  Endo/Other  diabetes    Renal/GU negative Renal ROS  negative genitourinary   Musculoskeletal  (+) Arthritis ,    Abdominal   Peds negative pediatric ROS (+)  Hematology negative hematology ROS (+)   Anesthesia Other Findings   Reproductive/Obstetrics negative OB ROS                             Anesthesia Physical Anesthesia Plan  ASA: 4  Anesthesia Plan: General   Post-op Pain Management: Tylenol PO (pre-op)*   Induction: Intravenous  PONV Risk Score and Plan: 2 and Ondansetron and Dexamethasone  Airway Management Planned: LMA  Additional Equipment:   Intra-op Plan:   Post-operative Plan: Extubation in OR  Informed Consent: I have reviewed the patients History and Physical, chart, labs and discussed the procedure including the risks, benefits and alternatives for the proposed anesthesia with the patient or authorized representative who has indicated his/her understanding and acceptance.     Dental advisory given  Plan Discussed with: CRNA  Anesthesia Plan Comments:         Anesthesia Quick Evaluation

## 2022-09-27 ENCOUNTER — Ambulatory Visit (INDEPENDENT_AMBULATORY_CARE_PROVIDER_SITE_OTHER): Payer: Medicare Other | Admitting: Cardiothoracic Surgery

## 2022-09-27 ENCOUNTER — Telehealth: Payer: Self-pay

## 2022-09-27 ENCOUNTER — Encounter: Payer: Self-pay | Admitting: Cardiothoracic Surgery

## 2022-09-27 VITALS — BP 154/54 | HR 81 | Resp 20 | Ht 77.0 in | Wt 224.4 lb

## 2022-09-27 DIAGNOSIS — E1169 Type 2 diabetes mellitus with other specified complication: Secondary | ICD-10-CM | POA: Diagnosis not present

## 2022-09-27 DIAGNOSIS — M869 Osteomyelitis, unspecified: Secondary | ICD-10-CM | POA: Diagnosis not present

## 2022-09-27 DIAGNOSIS — Z4889 Encounter for other specified surgical aftercare: Secondary | ICD-10-CM

## 2022-09-27 DIAGNOSIS — L02416 Cutaneous abscess of left lower limb: Secondary | ICD-10-CM | POA: Diagnosis not present

## 2022-09-27 DIAGNOSIS — M00012 Staphylococcal arthritis, left shoulder: Secondary | ICD-10-CM | POA: Diagnosis not present

## 2022-09-27 DIAGNOSIS — B9561 Methicillin susceptible Staphylococcus aureus infection as the cause of diseases classified elsewhere: Secondary | ICD-10-CM | POA: Diagnosis not present

## 2022-09-27 NOTE — Telephone Encounter (Signed)
Maralyn Sago, RN with [facility] contacted the office requesting an extension of patient's home health nursing. She requested weekly for 2-3 weeks to go over medication management and dressing changes. Verbal order given for continuing home health. She acknowledged receipt.

## 2022-09-27 NOTE — Progress Notes (Signed)
HPI: The patient returns for scheduled wound care after repeat debridement of the left sternoclavicular joint wound was required yesterday. A significant deep cavity was found at the time of surgery after the subcutaneous layer and fascia and grown over.  There is still somewhat slimy synovial membrane at the depth of the wound which was sized.  A fibrinous exudate over the left lateral manubrium and the medial left clavicular head was debrided as well.  I applied tissue matrix (myriad) which was covered with a Sorbact mesh and then wet-to-dry packing with Vashe wound solution.  Wound cultures are still unrevealing. Today in the clinic I remove the Vashe wet-to-dry and replaced them.  The Sorbact membrane was intact.  There is no purulence.  Plan on daily wet-to-dry dressing changes above the Sorbact minimally and weekly clinic visits to monitor progress. He will finish his current dose of antibiotics pending cultures.  Current Outpatient Medications  Medication Sig Dispense Refill   acetaminophen (TYLENOL) 500 MG tablet Take 1,000 mg by mouth every 6 (six) hours as needed for moderate pain.     amLODipine (NORVASC) 10 MG tablet Take 10 mg by mouth in the morning.     atorvastatin (LIPITOR) 80 MG tablet Take 1 tablet (80 mg total) by mouth daily. 30 tablet 3   carvedilol (COREG) 12.5 MG tablet Take 1 tablet (12.5 mg total) by mouth 2 (two) times daily. 30 tablet 3   cephALEXin (KEFLEX) 500 MG capsule Take 1 capsule (500 mg total) by mouth 3 (three) times daily. 21 capsule 0   insulin glargine (LANTUS) 100 UNIT/ML Solostar Pen Inject 70 Units into the skin at bedtime.     lisinopril (ZESTRIL) 10 MG tablet Take 1 tablet (10 mg total) by mouth daily. 30 tablet 3   Multiple Vitamins-Minerals (PRESERVISION AREDS 2) CAPS Take 1 capsule by mouth 2 (two) times daily.     nitroGLYCERIN (NITROSTAT) 0.4 MG SL tablet Place 1 tablet (0.4 mg total) under the tongue every 5 (five) minutes x 3 doses as needed  for chest pain (if no relief after 3rd dose, proceed to ED for evaluation or call 911). 25 tablet 3   NOVOLOG FLEXPEN 100 UNIT/ML FlexPen INJECT 25-31 UNITS INTO THE SKIN THREE TIMES DAILY BEFORE MEALS (Patient taking differently: Inject 50 Units into the skin in the morning, at noon, and at bedtime.) 30 mL 0   oxyCODONE (OXY IR/ROXICODONE) 5 MG immediate release tablet Take 1 tablet (5 mg total) by mouth every 4 (four) hours as needed for severe pain. 30 tablet 0   No current facility-administered medications for this visit.   Facility-Administered Medications Ordered in Other Visits  Medication Dose Route Frequency Provider Last Rate Last Admin   regadenoson (LEXISCAN) injection SOLN 0.4 mg  0.4 mg Intravenous Once Chilton Si, MD       technetium tetrofosmin (TC-MYOVIEW) injection 32.8 millicurie  32.8 millicurie Intravenous Once PRN Chilton Si, MD         Physical Exam: Blood pressure (!) 154/54, pulse 81, resp. rate 20, height 6\' 5"  (1.956 m), weight 224 lb 6.4 oz (101.8 kg), SpO2 94%.  Alert and comfortable Sinus rhythm Lungs clear No cardiac murmur Chest wall wound measures 4 cm long by 2 cm wide by 3 cm deep.  It was repacked as noted above.   Diagnostic Tests: Wound cultures remain negative  Impression: Nonhealing of left sternoclavicular wound after debridement for MSSA infection Patient's diabetic control is to be improved and we have adjusted his  Lantus insulin dosing The wound has been reopened and cannot be more effectively packed-continue daily dressing changes with Vashe wound solution wet-to-dry  Plan: Return for wound check in 3 days at the clinic. No showers, no returning to work until patient rechecked on September 16  Lovett Sox, MD [facility] [phone]

## 2022-09-28 LAB — WOUND CULTURE
MICRO NUMBER:: 15445612
RESULT:: NO GROWTH
SPECIMEN QUALITY:: ADEQUATE

## 2022-09-30 ENCOUNTER — Encounter: Payer: Self-pay | Admitting: Cardiothoracic Surgery

## 2022-09-30 ENCOUNTER — Ambulatory Visit (INDEPENDENT_AMBULATORY_CARE_PROVIDER_SITE_OTHER): Payer: Medicare Other | Admitting: Cardiothoracic Surgery

## 2022-09-30 VITALS — BP 170/76 | HR 85 | Resp 20 | Ht 77.0 in | Wt 223.0 lb

## 2022-09-30 DIAGNOSIS — Z5189 Encounter for other specified aftercare: Secondary | ICD-10-CM

## 2022-09-30 DIAGNOSIS — M009 Pyogenic arthritis, unspecified: Secondary | ICD-10-CM

## 2022-09-30 NOTE — Progress Notes (Signed)
HPI: Patient returns for postoperative follow-up after repeat debridement of the left sternoclavicular joint following development of 9 wound healing.  Wound remains culture negative.  In the OR 3 days ago soft tissue and bony debridement was performed and the base of the wound was filled with the myriad wound matrix covered with a Sorbact membrane.  Today I remove the Sorbact, debrided some fibrinous exudate at the base of the wound and then packed it completely with Vashe 4 x 4 wet-to-dry.  The wound measures 3 cm deep by 3 cm wide.  There is exposed bone of the left clavicular head and the left lateral manubrium.  Patient has been having some discomfort in his left neck and left shoulder following the latest debridement.  There is been no fever, bleeding, or purulent drainage.  Surgical cultures have been negative.  The patient states his blood sugars have been controlled in the 150 range  Current Outpatient Medications  Medication Sig Dispense Refill   acetaminophen (TYLENOL) 500 MG tablet Take 1,000 mg by mouth every 6 (six) hours as needed for moderate pain.     amLODipine (NORVASC) 10 MG tablet Take 10 mg by mouth in the morning.     atorvastatin (LIPITOR) 80 MG tablet Take 1 tablet (80 mg total) by mouth daily. 30 tablet 3   carvedilol (COREG) 12.5 MG tablet Take 1 tablet (12.5 mg total) by mouth 2 (two) times daily. 30 tablet 3   cephALEXin (KEFLEX) 500 MG capsule Take 1 capsule (500 mg total) by mouth 3 (three) times daily. 21 capsule 0   insulin glargine (LANTUS) 100 UNIT/ML Solostar Pen Inject 70 Units into the skin at bedtime.     lisinopril (ZESTRIL) 10 MG tablet Take 1 tablet (10 mg total) by mouth daily. 30 tablet 3   Multiple Vitamins-Minerals (PRESERVISION AREDS 2) CAPS Take 1 capsule by mouth 2 (two) times daily.     nitroGLYCERIN (NITROSTAT) 0.4 MG SL tablet Place 1 tablet (0.4 mg total) under the tongue every 5 (five) minutes x 3 doses as needed for chest pain (if no relief after  3rd dose, proceed to ED for evaluation or call 911). 25 tablet 3   NOVOLOG FLEXPEN 100 UNIT/ML FlexPen INJECT 25-31 UNITS INTO THE SKIN THREE TIMES DAILY BEFORE MEALS (Patient taking differently: Inject 50 Units into the skin in the morning, at noon, and at bedtime.) 30 mL 0   oxyCODONE (OXY IR/ROXICODONE) 5 MG immediate release tablet Take 1 tablet (5 mg total) by mouth every 4 (four) hours as needed for severe pain. 30 tablet 0   No current facility-administered medications for this visit.   Facility-Administered Medications Ordered in Other Visits  Medication Dose Route Frequency Provider Last Rate Last Admin   regadenoson (LEXISCAN) injection SOLN 0.4 mg  0.4 mg Intravenous Once Chilton Si, MD       technetium tetrofosmin (TC-MYOVIEW) injection 32.8 millicurie  32.8 millicurie Intravenous Once PRN Chilton Si, MD         Physical Exam: Blood pressure (!) 170/76, pulse 85, resp. rate 20, height 6\' 5"  (1.956 m), weight 223 lb (101.2 kg), SpO2 96%.  Patient is alert and responsive The dressing over the wound site is intact which was placed by his wife yesterday. Lungs are clear Heart rate is regular  Diagnostic Tests: Wound cultures from the OR negative  Impression: Extensive repeat debridement was performed because of nonhealing and superficial closure of the wound with a remaining deep unhealed space.  The space now  is wide open and can be filled with a single 4 x 4 gauze wet-to-dry with Vashe wound solution.  This will be performed daily by the family and overseen by home health nursing.  I will see the patient back frequently to oversee wound care and to follow progress.  He will remain on his empiric Keflex which can cover the MSSA original infection.  Plan: Return in 4 days for wound check and wound care.   Lovett Sox, MD [facility] [phone]

## 2022-10-02 DIAGNOSIS — B9561 Methicillin susceptible Staphylococcus aureus infection as the cause of diseases classified elsewhere: Secondary | ICD-10-CM | POA: Diagnosis not present

## 2022-10-02 DIAGNOSIS — M00012 Staphylococcal arthritis, left shoulder: Secondary | ICD-10-CM | POA: Diagnosis not present

## 2022-10-02 DIAGNOSIS — L02416 Cutaneous abscess of left lower limb: Secondary | ICD-10-CM | POA: Diagnosis not present

## 2022-10-02 DIAGNOSIS — M869 Osteomyelitis, unspecified: Secondary | ICD-10-CM | POA: Diagnosis not present

## 2022-10-02 DIAGNOSIS — E1169 Type 2 diabetes mellitus with other specified complication: Secondary | ICD-10-CM | POA: Diagnosis not present

## 2022-10-04 ENCOUNTER — Ambulatory Visit (INDEPENDENT_AMBULATORY_CARE_PROVIDER_SITE_OTHER): Payer: Self-pay | Admitting: Cardiothoracic Surgery

## 2022-10-04 ENCOUNTER — Encounter: Payer: Self-pay | Admitting: Cardiothoracic Surgery

## 2022-10-04 VITALS — BP 158/84 | HR 69 | Resp 20 | Ht 75.0 in | Wt 223.0 lb

## 2022-10-04 DIAGNOSIS — Z5189 Encounter for other specified aftercare: Secondary | ICD-10-CM

## 2022-10-04 NOTE — Progress Notes (Signed)
HPI:  The patient returns for scheduled office visit for wound care of the left sternoclavicular joint.  The patient is 1 week postop redo debridement which was required for nonhealing of the joint space.  The original infection started approximately 3 months ago with an episode of MSSA bacteremia.  Possibly from his diabetes and poor dental hygiene.  The patient is packing the wound daily with 4 x 4 gauze wet-to-dry and Vashe solution. Today I personally inspected the wound, remove some fibrinous exudate from the surface and repacked the wound with Vashe wet-to-dry dressing. The wound is still with only 10 to 20% granulation tissue. There is exposed clavicular head and lateral aspect of the manubrium.  All synovial membrane has been removed.  Patient is taking a course of oral Keflex. Current Outpatient Medications  Medication Sig Dispense Refill   acetaminophen (TYLENOL) 500 MG tablet Take 1,000 mg by mouth every 6 (six) hours as needed for moderate pain.     amLODipine (NORVASC) 10 MG tablet Take 10 mg by mouth in the morning.     atorvastatin (LIPITOR) 80 MG tablet Take 1 tablet (80 mg total) by mouth daily. 30 tablet 3   carvedilol (COREG) 12.5 MG tablet Take 1 tablet (12.5 mg total) by mouth 2 (two) times daily. 30 tablet 3   insulin glargine (LANTUS) 100 UNIT/ML Solostar Pen Inject 70 Units into the skin at bedtime.     lisinopril (ZESTRIL) 10 MG tablet Take 1 tablet (10 mg total) by mouth daily. 30 tablet 3   Multiple Vitamins-Minerals (PRESERVISION AREDS 2) CAPS Take 1 capsule by mouth 2 (two) times daily.     nitroGLYCERIN (NITROSTAT) 0.4 MG SL tablet Place 1 tablet (0.4 mg total) under the tongue every 5 (five) minutes x 3 doses as needed for chest pain (if no relief after 3rd dose, proceed to ED for evaluation or call 911). 25 tablet 3   NOVOLOG FLEXPEN 100 UNIT/ML FlexPen INJECT 25-31 UNITS INTO THE SKIN THREE TIMES DAILY BEFORE MEALS (Patient taking differently: Inject 50 Units into  the skin in the morning, at noon, and at bedtime.) 30 mL 0   oxyCODONE (OXY IR/ROXICODONE) 5 MG immediate release tablet Take 1 tablet (5 mg total) by mouth every 4 (four) hours as needed for severe pain. 30 tablet 0   No current facility-administered medications for this visit.   Facility-Administered Medications Ordered in Other Visits  Medication Dose Route Frequency Provider Last Rate Last Admin   regadenoson (LEXISCAN) injection SOLN 0.4 mg  0.4 mg Intravenous Once Chilton Si, MD       technetium tetrofosmin (TC-MYOVIEW) injection 32.8 millicurie  32.8 millicurie Intravenous Once PRN Chilton Si, MD         Physical Exam: Blood pressure (!) 158/84, pulse 69, resp. rate 20, height 6\' 3"  (1.905 m), weight 223 lb (101.2 kg), SpO2 97%.   Alert and oriented Lungs clear Heart rate regular, no murmur Less tenderness in the left shoulder neck today Wound repacked as noted above with Vashe wet-to-dry 4 x 4 gauze Diagnostic Tests: None  Impression: Slow to heal sternoclavicular joint debridement with diabetes. Patient will start taking oral zinc 220 mg daily to help with collagen formation. Plan: Continue daily dressing changes, oral antibiotic, and zinc supplement daily with good diabetic control. Return to clinic for wound care September 23.  Lovett Sox, MD [facility] [phone]

## 2022-10-06 DIAGNOSIS — S21109A Unspecified open wound of unspecified front wall of thorax without penetration into thoracic cavity, initial encounter: Secondary | ICD-10-CM | POA: Diagnosis not present

## 2022-10-07 ENCOUNTER — Ambulatory Visit (INDEPENDENT_AMBULATORY_CARE_PROVIDER_SITE_OTHER): Payer: Self-pay | Admitting: Cardiothoracic Surgery

## 2022-10-07 ENCOUNTER — Other Ambulatory Visit: Payer: Self-pay

## 2022-10-07 ENCOUNTER — Encounter: Payer: Self-pay | Admitting: Cardiothoracic Surgery

## 2022-10-07 VITALS — BP 140/80 | HR 86 | Resp 20 | Ht 75.0 in | Wt 223.0 lb

## 2022-10-07 DIAGNOSIS — M009 Pyogenic arthritis, unspecified: Secondary | ICD-10-CM

## 2022-10-07 DIAGNOSIS — T8130XA Disruption of wound, unspecified, initial encounter: Secondary | ICD-10-CM

## 2022-10-07 DIAGNOSIS — Z4801 Encounter for change or removal of surgical wound dressing: Secondary | ICD-10-CM

## 2022-10-07 MED ORDER — CEPHALEXIN 500 MG PO CAPS: 500.0000 mg | ORAL_CAPSULE | Freq: Three times a day (TID) | ORAL | 0 refills | Status: DC

## 2022-10-07 MED ORDER — OXYCODONE HCL 5 MG PO TABS: 5.0000 mg | ORAL_TABLET | ORAL | 0 refills | Status: DC | PRN

## 2022-10-07 NOTE — Addendum Note (Signed)
Addended by: Lovett Sox on: 10/07/2022 05:11 PM   Modules accepted: Orders

## 2022-10-07 NOTE — Progress Notes (Signed)
HPI: Patient returns today for scheduled wound care following redo debridement of the left sternoclavicular joint on September 12. Cavity is now 3 cm deep and starting to show some granulation tissue.  There is no purulence.  I rechecked another wound culture today in the office.  The patient's prescription for Keflex is renewed.  I personally repacked the wound with Vashe wet-to-dry 4 x 4 gauze.  He will keep packing it with Vashe wet-to-dry daily at home.  The origin of the left sternoclavicular joint abscess was MSSA bacteremia probably from necrotic dental disease.  The patient's diabetes is under better control with split doses of Lantus insulin 40 units twice daily.  Blood sugar today is 150. Current Outpatient Medications  Medication Sig Dispense Refill   acetaminophen (TYLENOL) 500 MG tablet Take 1,000 mg by mouth every 6 (six) hours as needed for moderate pain.     amLODipine (NORVASC) 10 MG tablet Take 10 mg by mouth in the morning.     atorvastatin (LIPITOR) 80 MG tablet Take 1 tablet (80 mg total) by mouth daily. 30 tablet 3   carvedilol (COREG) 12.5 MG tablet Take 1 tablet (12.5 mg total) by mouth 2 (two) times daily. 30 tablet 3   cephALEXin (KEFLEX) 500 MG capsule Take 1 capsule (500 mg total) by mouth 3 (three) times daily. 21 capsule 0   insulin glargine (LANTUS) 100 UNIT/ML Solostar Pen Inject 70 Units into the skin at bedtime.     lisinopril (ZESTRIL) 10 MG tablet Take 1 tablet (10 mg total) by mouth daily. 30 tablet 3   Multiple Vitamins-Minerals (PRESERVISION AREDS 2) CAPS Take 1 capsule by mouth 2 (two) times daily.     nitroGLYCERIN (NITROSTAT) 0.4 MG SL tablet Place 1 tablet (0.4 mg total) under the tongue every 5 (five) minutes x 3 doses as needed for chest pain (if no relief after 3rd dose, proceed to ED for evaluation or call 911). 25 tablet 3   NOVOLOG FLEXPEN 100 UNIT/ML FlexPen INJECT 25-31 UNITS INTO THE SKIN THREE TIMES DAILY BEFORE MEALS (Patient taking differently:  Inject 50 Units into the skin in the morning, at noon, and at bedtime.) 30 mL 0   oxyCODONE (OXY IR/ROXICODONE) 5 MG immediate release tablet Take 1 tablet (5 mg total) by mouth every 4 (four) hours as needed for severe pain. 30 tablet 0   No current facility-administered medications for this visit.   Facility-Administered Medications Ordered in Other Visits  Medication Dose Route Frequency Provider Last Rate Last Admin   regadenoson (LEXISCAN) injection SOLN 0.4 mg  0.4 mg Intravenous Once Chilton Si, MD       technetium tetrofosmin (TC-MYOVIEW) injection 32.8 millicurie  32.8 millicurie Intravenous Once PRN Chilton Si, MD         Physical Exam: Blood pressure (!) 140/80, pulse 86, resp. rate 20, height 6\' 3"  (1.905 m), weight 223 lb (101.2 kg), SpO2 95%.  Alert and comfortable Lungs clear Left  sternoclavicular joint clean with some superficial slimy granulation tissue cauterized with silver nitrate.  The defect was packed with 1.5 4 x 4 gauze of Vashe wet-to-dry. Heart rate regular without murmur  Diagnostic Tests: Wound culture from deep swab resubmitted today  Impression: Slow healing of left sternoclavicular joint wound following debridement x 2 with underlying type II diabetes History of MSSA bacteremia which probably is responsible for the abscess formation in the sternoclavicular joint  Plan: Return later this week for assessment of progress and wound care.  Renew oral Keflex  prescription.   Lovett Sox, MD [facility] [phone]

## 2022-10-08 ENCOUNTER — Telehealth: Payer: Self-pay

## 2022-10-08 DIAGNOSIS — S21109A Unspecified open wound of unspecified front wall of thorax without penetration into thoracic cavity, initial encounter: Secondary | ICD-10-CM | POA: Diagnosis not present

## 2022-10-08 NOTE — Telephone Encounter (Signed)
-----   Message from Lovett Sox sent at 10/07/2022  5:08 PM EDT ----- Regarding: RE: pain med refill Will do ----- Message ----- From: Joycelyn Schmid, LPN Sent: 5/62/1308   4:14 PM EDT To: Lovett Sox, MD Subject: pain med refill                                Mr Matta forgot to ask for a refill on his pain medication this am. Please send to his pharm/ noted in epic chart/ Thanks

## 2022-10-09 DIAGNOSIS — E1169 Type 2 diabetes mellitus with other specified complication: Secondary | ICD-10-CM | POA: Diagnosis not present

## 2022-10-09 DIAGNOSIS — M869 Osteomyelitis, unspecified: Secondary | ICD-10-CM | POA: Diagnosis not present

## 2022-10-09 DIAGNOSIS — M00012 Staphylococcal arthritis, left shoulder: Secondary | ICD-10-CM | POA: Diagnosis not present

## 2022-10-09 DIAGNOSIS — B9561 Methicillin susceptible Staphylococcus aureus infection as the cause of diseases classified elsewhere: Secondary | ICD-10-CM | POA: Diagnosis not present

## 2022-10-09 DIAGNOSIS — L02416 Cutaneous abscess of left lower limb: Secondary | ICD-10-CM | POA: Diagnosis not present

## 2022-10-10 ENCOUNTER — Encounter: Payer: Self-pay | Admitting: Cardiothoracic Surgery

## 2022-10-10 ENCOUNTER — Ambulatory Visit (INDEPENDENT_AMBULATORY_CARE_PROVIDER_SITE_OTHER): Payer: Self-pay | Admitting: Cardiothoracic Surgery

## 2022-10-10 VITALS — BP 173/77 | HR 83 | Resp 20

## 2022-10-10 DIAGNOSIS — T148XXD Other injury of unspecified body region, subsequent encounter: Secondary | ICD-10-CM

## 2022-10-10 DIAGNOSIS — Z4801 Encounter for change or removal of surgical wound dressing: Secondary | ICD-10-CM

## 2022-10-10 LAB — WOUND CULTURE
MICRO NUMBER:: 15502087
SPECIMEN QUALITY:: ADEQUATE

## 2022-10-10 MED ORDER — HYDROCOD POLI-CHLORPHE POLI ER 10-8 MG/5ML PO SUER: 5.0000 mL | Freq: Two times a day (BID) | ORAL | Status: DC | PRN

## 2022-10-10 NOTE — Progress Notes (Signed)
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  Culture taken at last visit 3 days ago remains negative.  The patient continues to pack the wound with Vashe wet-to-dry dressing daily.  He is also on oral Keflex and takes as needed oxycodone for pain.  He associated left shoulder and left neck pain is significantly improved.  There has been no fever, no drainage, no bleeding.  Today I personally inspected the wound and remove some fibrinous exudate from the surface and packed the wound with 4 x 4 gauze wet-to-dry with Vashe wound solution.  The wound is showing some granulation tissue at the base and appears to be healing.  Current Outpatient Medications  Medication Sig Dispense Refill   acetaminophen (TYLENOL) 500 MG tablet Take 1,000 mg by mouth every 6 (six) hours as needed for moderate pain.     amLODipine (NORVASC) 10 MG tablet Take 10 mg by mouth in the morning.     atorvastatin (LIPITOR) 80 MG tablet Take 1 tablet (80 mg total) by mouth daily. 30 tablet 3   carvedilol (COREG) 12.5 MG tablet Take 1 tablet (12.5 mg total) by mouth 2 (two) times daily. 30 tablet 3   cephALEXin (KEFLEX) 500 MG capsule Take 1 capsule (500 mg total) by mouth 3 (three) times daily. 21 capsule 0   insulin glargine (LANTUS) 100 UNIT/ML Solostar Pen Inject 70 Units into the skin at bedtime.     lisinopril (ZESTRIL) 10 MG tablet Take 1 tablet (10 mg total) by mouth daily. 30 tablet 3   Multiple Vitamins-Minerals (PRESERVISION AREDS 2) CAPS Take 1 capsule by mouth 2 (two) times daily.     nitroGLYCERIN (NITROSTAT) 0.4 MG SL tablet Place 1 tablet (0.4 mg total) under the tongue every 5 (five) minutes x 3 doses as needed for chest pain (if no relief after 3rd dose, proceed to ED for evaluation or call 911). 25 tablet 3   NOVOLOG FLEXPEN 100 UNIT/ML FlexPen INJECT 25-31 UNITS INTO THE SKIN THREE TIMES DAILY BEFORE MEALS  (Patient taking differently: Inject 50 Units into the skin in the morning, at noon, and at bedtime.) 30 mL 0   oxyCODONE (OXY IR/ROXICODONE) 5 MG immediate release tablet Take 1 tablet (5 mg total) by mouth every 4 (four) hours as needed for severe pain. 30 tablet 0   zinc gluconate 50 MG tablet Take 50 mg by mouth daily.     Current Facility-Administered Medications  Medication Dose Route Frequency Provider Last Rate Last Admin   chlorpheniramine-HYDROcodone (TUSSIONEX) 10-8 MG/5ML suspension 5 mL  5 mL Oral Q12H PRN        Facility-Administered Medications Ordered in Other Visits  Medication Dose Route Frequency Provider Last Rate Last Admin   regadenoson (LEXISCAN) injection SOLN 0.4 mg  0.4 mg Intravenous Once Chilton Si, MD       technetium tetrofosmin (TC-MYOVIEW) injection 32.8 millicurie  32.8 millicurie Intravenous Once PRN Chilton Si, MD         Physical Exam: Blood pressure (!) 173/77, pulse 83, resp. rate 20, SpO2 93%.   Alert and comfortable, complaining of a recurrent dry cough No chest wall tenderness Left sternoclavicular joint wound repacked as noted above Heart rate regular Lungs clear  Diagnostic Tests: Wound culture taken 3 days ago remains no growth  Impression: The sternoclavicular joint wound appears to be showing signs of healing and healthy granulation tissue after the second surgical debridement and removal of  more bone and synovial membrane.  Continue daily wet-to-dry dressing changes at home with Vashe solution and weekly follow-up office visits. Patient will be given some cough medicine-Tussionex. Plan: Return for wound check on September 30   Lovett Sox, MD [facility] [phone]

## 2022-10-11 ENCOUNTER — Other Ambulatory Visit: Payer: Self-pay | Admitting: Cardiothoracic Surgery

## 2022-10-11 ENCOUNTER — Telehealth: Payer: Self-pay

## 2022-10-11 MED ORDER — BENZONATATE 100 MG PO CAPS: 100.0000 mg | ORAL_CAPSULE | Freq: Three times a day (TID) | ORAL | 1 refills | Status: DC | PRN

## 2022-10-11 NOTE — Telephone Encounter (Signed)
Patient's wife contacted the office about cough medication. Mr. Routt was seen in the office yesterday and a rfill was to be sent in. Dr. Donata Clay made aware and refill of Tessalon Pearls was sent into patient's preferred pharmacy.  Per Dr Donata Clay patient is to also during Vashe wet-to-dry dressing changes to remove old gauze, place Vashe directly into the wound bed and let it sit for about 5 mins. Then blot with a dry clean gauze and apply another wet to dry Vashe dressing to the wound. Patient's wife aware and acknowledged receipt.

## 2022-10-14 ENCOUNTER — Encounter: Payer: Self-pay | Admitting: Cardiothoracic Surgery

## 2022-10-14 ENCOUNTER — Ambulatory Visit (INDEPENDENT_AMBULATORY_CARE_PROVIDER_SITE_OTHER): Payer: Self-pay | Admitting: Cardiothoracic Surgery

## 2022-10-14 VITALS — BP 176/94 | HR 95 | Resp 18 | Ht 75.0 in | Wt 223.0 lb

## 2022-10-14 DIAGNOSIS — Z5189 Encounter for other specified aftercare: Secondary | ICD-10-CM

## 2022-10-14 MED ORDER — LISINOPRIL 10 MG PO TABS
20.0000 mg | ORAL_TABLET | Freq: Every day | ORAL | 3 refills | Status: DC
Start: 2022-10-14 — End: 2022-10-28

## 2022-10-14 NOTE — Progress Notes (Signed)
HPI: The patient returns for scheduled wound care and follow-up. He is status post second surgical debridement of a left sternoclavicular joint infection following MSSA bacteremia. Wound is being irrigated with Vashe solution and then packed with a 4 x 4 wet-to-dry Vashe sterile dressing.  Cultures have been negative.  He is finishing a course of oral Keflex. There has been no bleeding or purulent drainage.  The discomfort in the left neck and left shoulder continue to improve.  Today I personally inspected the wound, irrigated with Vashe solution and then packed a 4 x 4 sterile gauze with Vashe wet-to-dry. Wound is now over 50% granulation tissue.  There is minimal fibrinous exudate to be removed.  Current Outpatient Medications  Medication Sig Dispense Refill   acetaminophen (TYLENOL) 500 MG tablet Take 1,000 mg by mouth every 6 (six) hours as needed for moderate pain.     amLODipine (NORVASC) 10 MG tablet Take 10 mg by mouth in the morning.     atorvastatin (LIPITOR) 80 MG tablet Take 1 tablet (80 mg total) by mouth daily. 30 tablet 3   benzonatate (TESSALON PERLES) 100 MG capsule Take 1 capsule (100 mg total) by mouth 3 (three) times daily as needed for cough. 20 capsule 1   carvedilol (COREG) 12.5 MG tablet Take 1 tablet (12.5 mg total) by mouth 2 (two) times daily. 30 tablet 3   cephALEXin (KEFLEX) 500 MG capsule Take 1 capsule (500 mg total) by mouth 3 (three) times daily. 21 capsule 0   insulin glargine (LANTUS) 100 UNIT/ML Solostar Pen Inject 70 Units into the skin at bedtime.     Multiple Vitamins-Minerals (PRESERVISION AREDS 2) CAPS Take 1 capsule by mouth 2 (two) times daily.     nitroGLYCERIN (NITROSTAT) 0.4 MG SL tablet Place 1 tablet (0.4 mg total) under the tongue every 5 (five) minutes x 3 doses as needed for chest pain (if no relief after 3rd dose, proceed to ED for evaluation or call 911). 25 tablet 3   NOVOLOG FLEXPEN 100 UNIT/ML FlexPen INJECT 25-31 UNITS INTO THE SKIN THREE  TIMES DAILY BEFORE MEALS (Patient taking differently: Inject 50 Units into the skin in the morning, at noon, and at bedtime.) 30 mL 0   oxyCODONE (OXY IR/ROXICODONE) 5 MG immediate release tablet Take 1 tablet (5 mg total) by mouth every 4 (four) hours as needed for severe pain. 30 tablet 0   zinc gluconate 50 MG tablet Take 50 mg by mouth daily.     lisinopril (ZESTRIL) 10 MG tablet Take 2 tablets (20 mg total) by mouth daily. 30 tablet 3   Current Facility-Administered Medications  Medication Dose Route Frequency Provider Last Rate Last Admin   chlorpheniramine-HYDROcodone (TUSSIONEX) 10-8 MG/5ML suspension 5 mL  5 mL Oral Q12H PRN        Facility-Administered Medications Ordered in Other Visits  Medication Dose Route Frequency Provider Last Rate Last Admin   regadenoson (LEXISCAN) injection SOLN 0.4 mg  0.4 mg Intravenous Once Chilton Si, MD       technetium tetrofosmin (TC-MYOVIEW) injection 32.8 millicurie  32.8 millicurie Intravenous Once PRN Chilton Si, MD         Physical Exam: Blood pressure (!) 176/94, pulse 95, resp. rate 18, height 6\' 3"  (1.905 m), weight 223 lb (101.2 kg), SpO2 96%.  Alert and comfortable Lungs are clear Heart rate regular Wound inspected over left sternoclavicular area measuring 3 cm long by 2.5 cm wide by 2.5 cm deep.  Packed with 4 x 4  gauze using Vashe wet-to-dry  Diagnostic Tests: None  Impression: Slowly healing left sternoclavicular joint infection and this 77 year old diabetic.  Hypertension not adequately controlled.  Blood pressure today 177 systolic.  Patient will increase the lisinopril from 10 mg a day to 20 mg daily.  Follow-up blood pressure with next visit in 1 week. Plan: Return for wound care and blood pressure check in 1 week, October 7.  Increase lisinopril to 20 mg daily.   Lovett Sox, MD [facility] [phone]

## 2022-10-17 ENCOUNTER — Ambulatory Visit (INDEPENDENT_AMBULATORY_CARE_PROVIDER_SITE_OTHER): Payer: Self-pay | Admitting: Cardiothoracic Surgery

## 2022-10-17 ENCOUNTER — Encounter: Payer: Self-pay | Admitting: Cardiothoracic Surgery

## 2022-10-17 VITALS — BP 154/88 | HR 85 | Resp 20 | Ht 75.0 in | Wt 223.0 lb

## 2022-10-17 DIAGNOSIS — Z4801 Encounter for change or removal of surgical wound dressing: Secondary | ICD-10-CM

## 2022-10-17 DIAGNOSIS — M009 Pyogenic arthritis, unspecified: Secondary | ICD-10-CM

## 2022-10-17 DIAGNOSIS — Z4889 Encounter for other specified surgical aftercare: Secondary | ICD-10-CM

## 2022-10-17 MED ORDER — CEPHALEXIN 500 MG PO CAPS: 500.0000 mg | ORAL_CAPSULE | Freq: Three times a day (TID) | ORAL | 0 refills | Status: DC

## 2022-10-17 NOTE — Progress Notes (Signed)
HPI: Patient presents for wound check and daily wound care with wet-to-dry packing using Vashe solution. The wound is starting to close in and the 4 x 4 gauze now does not fit into the deeper recess and we will transition to 1 inch Nu Gauze soaked in Vashe to pack the wound.  I remove the previously placed gauze, rubbed off the fibrinous exudate with a dry 4 x 4 into the wound bed, fill the wound with Vashe solution for 5 minutes then blotted that out and packed the wound with 1 inch Nu Gauze with Vashe wet-to-dry covered with a Tegaderm.  The skin is starting to close over the top of the defect and this was treated with silver nitrate cauterization.  Continue daily wet-to-dry dressing changes now using 1 inch Nu Gauze.  Continue Keflex 500mg  3 times daily  Current Outpatient Medications  Medication Sig Dispense Refill   acetaminophen (TYLENOL) 500 MG tablet Take 1,000 mg by mouth every 6 (six) hours as needed for moderate pain.     amLODipine (NORVASC) 10 MG tablet Take 10 mg by mouth in the morning.     atorvastatin (LIPITOR) 80 MG tablet Take 1 tablet (80 mg total) by mouth daily. 30 tablet 3   benzonatate (TESSALON PERLES) 100 MG capsule Take 1 capsule (100 mg total) by mouth 3 (three) times daily as needed for cough. 20 capsule 1   carvedilol (COREG) 12.5 MG tablet Take 1 tablet (12.5 mg total) by mouth 2 (two) times daily. 30 tablet 3   insulin glargine (LANTUS) 100 UNIT/ML Solostar Pen Inject 70 Units into the skin at bedtime.     lisinopril (ZESTRIL) 10 MG tablet Take 2 tablets (20 mg total) by mouth daily. 30 tablet 3   Multiple Vitamins-Minerals (PRESERVISION AREDS 2) CAPS Take 1 capsule by mouth 2 (two) times daily.     nitroGLYCERIN (NITROSTAT) 0.4 MG SL tablet Place 1 tablet (0.4 mg total) under the tongue every 5 (five) minutes x 3 doses as needed for chest pain (if no relief after 3rd dose, proceed to ED for evaluation or call 911). 25 tablet 3   NOVOLOG FLEXPEN 100 UNIT/ML  FlexPen INJECT 25-31 UNITS INTO THE SKIN THREE TIMES DAILY BEFORE MEALS (Patient taking differently: Inject 50 Units into the skin in the morning, at noon, and at bedtime.) 30 mL 0   oxyCODONE (OXY IR/ROXICODONE) 5 MG immediate release tablet Take 1 tablet (5 mg total) by mouth every 4 (four) hours as needed for severe pain. 30 tablet 0   zinc gluconate 50 MG tablet Take 50 mg by mouth daily.     cephALEXin (KEFLEX) 500 MG capsule Take 1 capsule (500 mg total) by mouth 3 (three) times daily. 21 capsule 0   Current Facility-Administered Medications  Medication Dose Route Frequency Provider Last Rate Last Admin   chlorpheniramine-HYDROcodone (TUSSIONEX) 10-8 MG/5ML suspension 5 mL  5 mL Oral Q12H PRN        Facility-Administered Medications Ordered in Other Visits  Medication Dose Route Frequency Provider Last Rate Last Admin   regadenoson (LEXISCAN) injection SOLN 0.4 mg  0.4 mg Intravenous Once Chilton Si, MD       technetium tetrofosmin (TC-MYOVIEW) injection 32.8 millicurie  32.8 millicurie Intravenous Once PRN Chilton Si, MD         Physical Exam: Blood pressure (!) 154/88, pulse 85, resp. rate 20, height 6\' 3"  (1.905 m), weight 223 lb (101.2 kg), SpO2 95%.  Wound now with about 60% granulation tissue, no  purulence Lungs clear Heart rate regular without murmur No edema  Diagnostic Tests: None  Impression: Left sternoclavicular joint infection and wound now healing by secondary intention with daily packing using Vashe solution wet-to-dry and oral antibiotic for MSSA positive cultures  Plan: Return in 4 days for wound care.   Lovett Sox, MD [facility] [phone]

## 2022-10-18 ENCOUNTER — Telehealth: Payer: Self-pay

## 2022-10-18 DIAGNOSIS — L02416 Cutaneous abscess of left lower limb: Secondary | ICD-10-CM | POA: Diagnosis not present

## 2022-10-18 DIAGNOSIS — M00012 Staphylococcal arthritis, left shoulder: Secondary | ICD-10-CM | POA: Diagnosis not present

## 2022-10-18 DIAGNOSIS — T8141XA Infection following a procedure, superficial incisional surgical site, initial encounter: Secondary | ICD-10-CM | POA: Diagnosis not present

## 2022-10-18 DIAGNOSIS — L02213 Cutaneous abscess of chest wall: Secondary | ICD-10-CM | POA: Diagnosis not present

## 2022-10-18 DIAGNOSIS — B9561 Methicillin susceptible Staphylococcus aureus infection as the cause of diseases classified elsewhere: Secondary | ICD-10-CM | POA: Diagnosis not present

## 2022-10-18 NOTE — Telephone Encounter (Signed)
Maralyn Sago, RN with [facility] contacted the office requesting orders for dressing change. Patient was seen in the office yesterday, 10/3. Patient's dressing change was wet-to-dry Nu gauze packing and Tegaderm with Vashe. Also advised per Dr. Donata Clay that patient is to soak Vashe into the wound for 5 mins prior to dressing. Maralyn Sago, RN aware and acknowledged receipt.

## 2022-10-21 ENCOUNTER — Ambulatory Visit (INDEPENDENT_AMBULATORY_CARE_PROVIDER_SITE_OTHER): Payer: Self-pay | Admitting: Cardiothoracic Surgery

## 2022-10-21 ENCOUNTER — Encounter: Payer: Self-pay | Admitting: Cardiothoracic Surgery

## 2022-10-21 VITALS — BP 161/83 | HR 71 | Resp 18 | Ht 75.0 in

## 2022-10-21 DIAGNOSIS — T148XXA Other injury of unspecified body region, initial encounter: Secondary | ICD-10-CM | POA: Insufficient documentation

## 2022-10-21 DIAGNOSIS — M009 Pyogenic arthritis, unspecified: Secondary | ICD-10-CM

## 2022-10-21 DIAGNOSIS — Z5189 Encounter for other specified aftercare: Secondary | ICD-10-CM

## 2022-10-21 NOTE — Progress Notes (Signed)
HPI: The patient returns for a wound dressing change and exam. He feels somewhat with low energy after starting Tussionex for his cough and he will reduce that to just bedtime dosing. His vital signs are stable. I inspected the wound.  The packing from home had fallen out of the base of the wound. I scraped some fibrinous exudate off the base of the wound and applied silver nitrate to some granulation tissue trying to close the opening of the wound. I replaced a 1 inch Nu Gauze wet-to-dry with Vashe dressing change.  I demonstrated to the family member how to perform the dressing change. Patient is to continue his oral antibiotic and daily dressing changes.  Return in 3 days. Current Outpatient Medications  Medication Sig Dispense Refill   acetaminophen (TYLENOL) 500 MG tablet Take 1,000 mg by mouth every 6 (six) hours as needed for moderate pain.     amLODipine (NORVASC) 10 MG tablet Take 10 mg by mouth in the morning.     atorvastatin (LIPITOR) 80 MG tablet Take 1 tablet (80 mg total) by mouth daily. 30 tablet 3   benzonatate (TESSALON PERLES) 100 MG capsule Take 1 capsule (100 mg total) by mouth 3 (three) times daily as needed for cough. 20 capsule 1   carvedilol (COREG) 12.5 MG tablet Take 1 tablet (12.5 mg total) by mouth 2 (two) times daily. 30 tablet 3   cephALEXin (KEFLEX) 500 MG capsule Take 1 capsule (500 mg total) by mouth 3 (three) times daily. 21 capsule 0   insulin glargine (LANTUS) 100 UNIT/ML Solostar Pen Inject 70 Units into the skin at bedtime.     lisinopril (ZESTRIL) 10 MG tablet Take 2 tablets (20 mg total) by mouth daily. 30 tablet 3   Multiple Vitamins-Minerals (PRESERVISION AREDS 2) CAPS Take 1 capsule by mouth 2 (two) times daily.     nitroGLYCERIN (NITROSTAT) 0.4 MG SL tablet Place 1 tablet (0.4 mg total) under the tongue every 5 (five) minutes x 3 doses as needed for chest pain (if no relief after 3rd dose, proceed to ED for evaluation or call 911). 25 tablet 3    NOVOLOG FLEXPEN 100 UNIT/ML FlexPen INJECT 25-31 UNITS INTO THE SKIN THREE TIMES DAILY BEFORE MEALS (Patient taking differently: Inject 50 Units into the skin in the morning, at noon, and at bedtime.) 30 mL 0   oxyCODONE (OXY IR/ROXICODONE) 5 MG immediate release tablet Take 1 tablet (5 mg total) by mouth every 4 (four) hours as needed for severe pain. 30 tablet 0   zinc gluconate 50 MG tablet Take 50 mg by mouth daily.     Current Facility-Administered Medications  Medication Dose Route Frequency Provider Last Rate Last Admin   chlorpheniramine-HYDROcodone (TUSSIONEX) 10-8 MG/5ML suspension 5 mL  5 mL Oral Q12H PRN        Facility-Administered Medications Ordered in Other Visits  Medication Dose Route Frequency Provider Last Rate Last Admin   regadenoson (LEXISCAN) injection SOLN 0.4 mg  0.4 mg Intravenous Once Chilton Si, MD       technetium tetrofosmin (TC-MYOVIEW) injection 32.8 millicurie  32.8 millicurie Intravenous Once PRN Chilton Si, MD         Physical Exam: Blood pressure (!) 161/83, pulse 71, resp. rate 18, height 6\' 3"  (1.905 m), SpO2 96%.  Alert and comfortable Lungs clear Heart rate regular Wound 3 cm long, 2.5 cm deep and closing with granulation tissue.  Diagnostic Tests: None  Impression: Left sternoclavicular joint wound healing slowly by secondary intent.  Previously culture positive for MSSA bacteremia.  Plan: Continue daily wet-to-dry dressing changes with Vashe Return to clinic for wound care on October 10.  Lovett Sox, MD [facility] [phone]

## 2022-10-22 DIAGNOSIS — Z299 Encounter for prophylactic measures, unspecified: Secondary | ICD-10-CM | POA: Diagnosis not present

## 2022-10-22 DIAGNOSIS — R0602 Shortness of breath: Secondary | ICD-10-CM | POA: Diagnosis not present

## 2022-10-22 DIAGNOSIS — J069 Acute upper respiratory infection, unspecified: Secondary | ICD-10-CM | POA: Diagnosis not present

## 2022-10-24 ENCOUNTER — Ambulatory Visit (INDEPENDENT_AMBULATORY_CARE_PROVIDER_SITE_OTHER): Payer: Self-pay | Admitting: Cardiothoracic Surgery

## 2022-10-24 ENCOUNTER — Encounter: Payer: Self-pay | Admitting: Cardiothoracic Surgery

## 2022-10-24 ENCOUNTER — Other Ambulatory Visit: Payer: Self-pay | Admitting: Cardiothoracic Surgery

## 2022-10-24 VITALS — BP 178/74 | HR 79 | Resp 20

## 2022-10-24 DIAGNOSIS — Z5189 Encounter for other specified aftercare: Secondary | ICD-10-CM

## 2022-10-24 NOTE — Progress Notes (Signed)
HPI: Patient presents for scheduled wound care of the left sternoclavicular abscess site following debridement x 2. The wound is being packed with Vashe 1 inch Nu Gauze daily.  I personally examined the wound which shows continued filling with granulation tissue at the deep base.  The patient was seen by his primary care doctor after his last visit here and was started on a Z-Pak for bronchitis. Current Outpatient Medications  Medication Sig Dispense Refill   acetaminophen (TYLENOL) 500 MG tablet Take 1,000 mg by mouth every 6 (six) hours as needed for moderate pain.     amLODipine (NORVASC) 10 MG tablet Take 10 mg by mouth in the morning.     atorvastatin (LIPITOR) 80 MG tablet Take 1 tablet (80 mg total) by mouth daily. 30 tablet 3   benzonatate (TESSALON PERLES) 100 MG capsule Take 1 capsule (100 mg total) by mouth 3 (three) times daily as needed for cough. 20 capsule 1   carvedilol (COREG) 12.5 MG tablet Take 1 tablet (12.5 mg total) by mouth 2 (two) times daily. 30 tablet 3   cephALEXin (KEFLEX) 500 MG capsule Take 1 capsule (500 mg total) by mouth 3 (three) times daily. 21 capsule 0   insulin glargine (LANTUS) 100 UNIT/ML Solostar Pen Inject 70 Units into the skin at bedtime.     lisinopril (ZESTRIL) 10 MG tablet Take 2 tablets (20 mg total) by mouth daily. 30 tablet 3   Multiple Vitamins-Minerals (PRESERVISION AREDS 2) CAPS Take 1 capsule by mouth 2 (two) times daily.     nitroGLYCERIN (NITROSTAT) 0.4 MG SL tablet Place 1 tablet (0.4 mg total) under the tongue every 5 (five) minutes x 3 doses as needed for chest pain (if no relief after 3rd dose, proceed to ED for evaluation or call 911). 25 tablet 3   NOVOLOG FLEXPEN 100 UNIT/ML FlexPen INJECT 25-31 UNITS INTO THE SKIN THREE TIMES DAILY BEFORE MEALS (Patient taking differently: Inject 50 Units into the skin in the morning, at noon, and at bedtime.) 30 mL 0   oxyCODONE (OXY IR/ROXICODONE) 5 MG immediate release tablet Take 1 tablet (5 mg  total) by mouth every 4 (four) hours as needed for severe pain. 30 tablet 0   zinc gluconate 50 MG tablet Take 50 mg by mouth daily.     Current Facility-Administered Medications  Medication Dose Route Frequency Provider Last Rate Last Admin   chlorpheniramine-HYDROcodone (TUSSIONEX) 10-8 MG/5ML suspension 5 mL  5 mL Oral Q12H PRN        Facility-Administered Medications Ordered in Other Visits  Medication Dose Route Frequency Provider Last Rate Last Admin   regadenoson (LEXISCAN) injection SOLN 0.4 mg  0.4 mg Intravenous Once Chilton Si, MD       technetium tetrofosmin (TC-MYOVIEW) injection 32.8 millicurie  32.8 millicurie Intravenous Once PRN Chilton Si, MD         Physical Exam: Vitals:   10/24/22 0933  BP: (!) 178/74  Pulse: 79  Resp: 20  SpO2: 95%  The patient appears somewhat congested but breathing comfortably Lungs are clear Heart rate regular without gallop or murmur Left sternoclavicular joint wound without erythema or purulence, repacked with 1 inch Nu Gauze Vashe wet-to-dry sterile technique   Diagnostic Tests: None  Impression: Left sternoclavicular joint wound healing by secondary intent Continue daily wet-to-dry dressing changes at home and twice a week clinic visits for wound care Plan: Return to clinic in 4 days for wound care.   Lovett Sox, MD [facility] (  [phone]       

## 2022-10-25 ENCOUNTER — Other Ambulatory Visit: Payer: Self-pay | Admitting: Physician Assistant

## 2022-10-25 DIAGNOSIS — Z5189 Encounter for other specified aftercare: Secondary | ICD-10-CM | POA: Diagnosis not present

## 2022-10-25 MED ORDER — HYDROCOD POLI-CHLORPHE POLI ER 10-8 MG/5ML PO SUER: 5.0000 mL | Freq: Two times a day (BID) | ORAL | 0 refills | Status: DC | PRN

## 2022-10-25 NOTE — Progress Notes (Signed)
Renewed Rx for Tussionex 5ml q12 hours prn cough per patient's request.  .  Gaynelle Arabian, PA-C

## 2022-10-26 LAB — CBC WITH DIFFERENTIAL/PLATELET
Absolute Monocytes: 435 {cells}/uL (ref 200–950)
Basophils Absolute: 28 {cells}/uL (ref 0–200)
Basophils Relative: 0.5 %
Eosinophils Absolute: 99 {cells}/uL (ref 15–500)
Eosinophils Relative: 1.8 %
HCT: 34.8 % — ABNORMAL LOW (ref 38.5–50.0)
Hemoglobin: 10.6 g/dL — ABNORMAL LOW (ref 13.2–17.1)
Lymphs Abs: 666 {cells}/uL — ABNORMAL LOW (ref 850–3900)
MCH: 24.8 pg — ABNORMAL LOW (ref 27.0–33.0)
MCHC: 30.5 g/dL — ABNORMAL LOW (ref 32.0–36.0)
MCV: 81.3 fL (ref 80.0–100.0)
MPV: 10.3 fL (ref 7.5–12.5)
Monocytes Relative: 7.9 %
Neutro Abs: 4274 {cells}/uL (ref 1500–7800)
Neutrophils Relative %: 77.7 %
Platelets: 217 10*3/uL (ref 140–400)
RBC: 4.28 10*6/uL (ref 4.20–5.80)
RDW: 14 % (ref 11.0–15.0)
Total Lymphocyte: 12.1 %
WBC: 5.5 10*3/uL (ref 3.8–10.8)

## 2022-10-26 LAB — RENAL FUNCTION PANEL
Albumin: 3.9 g/dL (ref 3.6–5.1)
BUN: 13 mg/dL (ref 7–25)
CO2: 29 mmol/L (ref 20–32)
Calcium: 8.7 mg/dL (ref 8.6–10.3)
Chloride: 102 mmol/L (ref 98–110)
Creat: 0.75 mg/dL (ref 0.70–1.28)
Glucose, Bld: 275 mg/dL — ABNORMAL HIGH (ref 65–139)
Phosphorus: 4.6 mg/dL — ABNORMAL HIGH (ref 2.1–4.3)
Potassium: 4.8 mmol/L (ref 3.5–5.3)
Sodium: 138 mmol/L (ref 135–146)

## 2022-10-28 ENCOUNTER — Other Ambulatory Visit: Payer: Self-pay | Admitting: *Deleted

## 2022-10-28 ENCOUNTER — Encounter: Payer: Self-pay | Admitting: Cardiothoracic Surgery

## 2022-10-28 ENCOUNTER — Ambulatory Visit: Payer: Medicare Other | Admitting: Cardiothoracic Surgery

## 2022-10-28 VITALS — BP 198/84 | HR 74

## 2022-10-28 DIAGNOSIS — Z5189 Encounter for other specified aftercare: Secondary | ICD-10-CM

## 2022-10-28 DIAGNOSIS — M009 Pyogenic arthritis, unspecified: Secondary | ICD-10-CM | POA: Diagnosis not present

## 2022-10-28 MED ORDER — HYDROCOD POLI-CHLORPHE POLI ER 10-8 MG/5ML PO SUER: 5.0000 mL | Freq: Two times a day (BID) | ORAL | 0 refills | Status: DC | PRN

## 2022-10-28 MED ORDER — FUROSEMIDE 40 MG PO TABS: 40.0000 mg | ORAL_TABLET | Freq: Every day | ORAL | 0 refills | Status: DC

## 2022-10-28 NOTE — Progress Notes (Signed)
HPI: Office visit for wound care- Hx L [place] joint debridement Labs at last visit - glucose 275, will increase lantus to 50 u bid Last wound culture no growth- will repeat today for surveillance BP today 190 systolic. He has developed a dry cough after lisinopril was increased so will increase the coreg and hold lisinopril Complaining f anle edema,abdominal fullness, SOB- will give short course of lasix  Family doing daily Vashe wet/dry packing of wound  Current Outpatient Medications  Medication Sig Dispense Refill   furosemide (LASIX) 40 MG tablet Take 1 tablet (40 mg total) by mouth daily. 14 tablet 0   acetaminophen (TYLENOL) 500 MG tablet Take 1,000 mg by mouth every 6 (six) hours as needed for moderate pain.     amLODipine (NORVASC) 10 MG tablet Take 10 mg by mouth in the morning.     atorvastatin (LIPITOR) 80 MG tablet Take 1 tablet (80 mg total) by mouth daily. 30 tablet 3   benzonatate (TESSALON PERLES) 100 MG capsule Take 1 capsule (100 mg total) by mouth 3 (three) times daily as needed for cough. 20 capsule 1   carvedilol (COREG) 12.5 MG tablet Take 1 tablet (12.5 mg total) by mouth 2 (two) times daily. 30 tablet 3   chlorpheniramine-HYDROcodone (TUSSIONEX) 10-8 MG/5ML Take 5 mLs by mouth every 12 (twelve) hours as needed for cough. 115 mL 0   insulin glargine (LANTUS) 100 UNIT/ML Solostar Pen Inject 70 Units into the skin at bedtime.     Multiple Vitamins-Minerals (PRESERVISION AREDS 2) CAPS Take 1 capsule by mouth 2 (two) times daily.     nitroGLYCERIN (NITROSTAT) 0.4 MG SL tablet Place 1 tablet (0.4 mg total) under the tongue every 5 (five) minutes x 3 doses as needed for chest pain (if no relief after 3rd dose, proceed to ED for evaluation or call 911). 25 tablet 3   NOVOLOG FLEXPEN 100 UNIT/ML FlexPen INJECT 25-31 UNITS INTO THE SKIN THREE TIMES DAILY BEFORE MEALS (Patient taking differently: Inject 50 Units into the skin in the morning, at noon, and at bedtime.) 30 mL 0    oxyCODONE (OXY IR/ROXICODONE) 5 MG immediate release tablet Take 1 tablet (5 mg total) by mouth every 4 (four) hours as needed for severe pain. 30 tablet 0   zinc gluconate 50 MG tablet Take 50 mg by mouth daily.     No current facility-administered medications for this visit.   Facility-Administered Medications Ordered in Other Visits  Medication Dose Route Frequency Provider Last Rate Last Admin   regadenoson (LEXISCAN) injection SOLN 0.4 mg  0.4 mg Intravenous Once Chilton Si, MD       technetium tetrofosmin (TC-MYOVIEW) injection 32.8 millicurie  32.8 millicurie Intravenous Once PRN Chilton Si, MD         Physical Exam: Blood pressure (!) 198/84, pulse 74, SpO2 95%.       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

## 2022-10-28 NOTE — Progress Notes (Signed)
Contacted [facility] for routine lab pick up for chest wound culture. Confirmation number [id number].

## 2022-10-31 ENCOUNTER — Other Ambulatory Visit: Payer: Self-pay

## 2022-10-31 ENCOUNTER — Ambulatory Visit (INDEPENDENT_AMBULATORY_CARE_PROVIDER_SITE_OTHER): Payer: Medicare Other | Admitting: Cardiothoracic Surgery

## 2022-10-31 ENCOUNTER — Encounter: Payer: Self-pay | Admitting: Cardiothoracic Surgery

## 2022-10-31 VITALS — BP 172/82 | HR 84 | Resp 20

## 2022-10-31 DIAGNOSIS — Z5189 Encounter for other specified aftercare: Secondary | ICD-10-CM

## 2022-10-31 LAB — WOUND CULTURE
MICRO NUMBER:: 15590577
SPECIMEN QUALITY:: ADEQUATE

## 2022-10-31 NOTE — Progress Notes (Signed)
Wound culture sent to [facility] per Dr. Zenaida Niece Trigt's request.

## 2022-10-31 NOTE — Progress Notes (Signed)
HPI: The patient returns for scheduled wound care follow-up. He has a slow to heal left sternoclavicular joint wound which required surgical debridement x 2 for MSSA infection.  His most recent wound culture was negative and that was repeated today to reconfirm there is been no recurrence of the MSSA.  Wound is contracting with some mucoid drainage.  Is being packed with 1 inch Nu Gauze wet-to-dry with Vashe wound solution.  I personally examined the wound today, stretched the opening at the skin to better allow healing by secondary intent and irrigated the wound with Vashe and applied a wet-to-dry dressing.  Patient's cough has improved after stopping the lisinopril.Marland Kitchen  His Lasix has resulted in improvement in his dyspnea on exertion and ankle edema.  His blood pressure has improved by titrating the carvedilol.   Current Outpatient Medications  Medication Sig Dispense Refill   acetaminophen (TYLENOL) 500 MG tablet Take 1,000 mg by mouth every 6 (six) hours as needed for moderate pain.     amLODipine (NORVASC) 10 MG tablet Take 10 mg by mouth in the morning.     atorvastatin (LIPITOR) 80 MG tablet Take 1 tablet (80 mg total) by mouth daily. 30 tablet 3   benzonatate (TESSALON PERLES) 100 MG capsule Take 1 capsule (100 mg total) by mouth 3 (three) times daily as needed for cough. 20 capsule 1   carvedilol (COREG) 12.5 MG tablet Take 1 tablet (12.5 mg total) by mouth 2 (two) times daily. 30 tablet 3   chlorpheniramine-HYDROcodone (TUSSIONEX) 10-8 MG/5ML Take 5 mLs by mouth every 12 (twelve) hours as needed for cough. 115 mL 0   furosemide (LASIX) 40 MG tablet Take 1 tablet (40 mg total) by mouth daily. 14 tablet 0   insulin glargine (LANTUS) 100 UNIT/ML Solostar Pen Inject 70 Units into the skin at bedtime.     Multiple Vitamins-Minerals (PRESERVISION AREDS 2) CAPS Take 1 capsule by mouth 2 (two) times daily.     nitroGLYCERIN (NITROSTAT) 0.4 MG SL tablet Place 1 tablet (0.4 mg total) under the  tongue every 5 (five) minutes x 3 doses as needed for chest pain (if no relief after 3rd dose, proceed to ED for evaluation or call 911). 25 tablet 3   NOVOLOG FLEXPEN 100 UNIT/ML FlexPen INJECT 25-31 UNITS INTO THE SKIN THREE TIMES DAILY BEFORE MEALS (Patient taking differently: Inject 50 Units into the skin in the morning, at noon, and at bedtime.) 30 mL 0   oxyCODONE (OXY IR/ROXICODONE) 5 MG immediate release tablet Take 1 tablet (5 mg total) by mouth every 4 (four) hours as needed for severe pain. 30 tablet 0   zinc gluconate 50 MG tablet Take 50 mg by mouth daily.     No current facility-administered medications for this visit.   Facility-Administered Medications Ordered in Other Visits  Medication Dose Route Frequency Provider Last Rate Last Admin   regadenoson (LEXISCAN) injection SOLN 0.4 mg  0.4 mg Intravenous Once Chilton Si, MD       technetium tetrofosmin (TC-MYOVIEW) injection 32.8 millicurie  32.8 millicurie Intravenous Once PRN Chilton Si, MD         Physical Exam: Blood pressure (!) 172/82, pulse 84, resp. rate 20, SpO2 95%.   Exam Alert and comfortable Lungs clear Heart rate regular without murmur Left sternoclavicular joint is slowly healing by secondary intent and becoming more narrow. Minimal ankle edema.  Diagnostic Tests: Wound culture obtained of the left sternoclavicular joint  Impression: Slow improvement in the wound, his breathing, blood  pressure, and cough. Will follow-up wound cultures. Plan: Continue home wound care and return for office visit in 4 days   Lovett Sox, MD [facility] [phone]

## 2022-11-01 DIAGNOSIS — L02416 Cutaneous abscess of left lower limb: Secondary | ICD-10-CM | POA: Diagnosis not present

## 2022-11-01 DIAGNOSIS — M00012 Staphylococcal arthritis, left shoulder: Secondary | ICD-10-CM | POA: Diagnosis not present

## 2022-11-01 DIAGNOSIS — B9561 Methicillin susceptible Staphylococcus aureus infection as the cause of diseases classified elsewhere: Secondary | ICD-10-CM | POA: Diagnosis not present

## 2022-11-01 DIAGNOSIS — L02213 Cutaneous abscess of chest wall: Secondary | ICD-10-CM | POA: Diagnosis not present

## 2022-11-01 DIAGNOSIS — T8141XA Infection following a procedure, superficial incisional surgical site, initial encounter: Secondary | ICD-10-CM | POA: Diagnosis not present

## 2022-11-03 LAB — WOUND CULTURE
MICRO NUMBER:: 15608110
SPECIMEN QUALITY:: ADEQUATE

## 2022-11-03 LAB — SPECIMEN ID NOTIFICATION MISSING 2ND ID

## 2022-11-05 ENCOUNTER — Encounter: Payer: Self-pay | Admitting: Cardiothoracic Surgery

## 2022-11-05 ENCOUNTER — Ambulatory Visit (INDEPENDENT_AMBULATORY_CARE_PROVIDER_SITE_OTHER): Payer: Medicare Other | Admitting: Cardiothoracic Surgery

## 2022-11-05 VITALS — BP 181/104 | HR 90 | Resp 18 | Ht 75.0 in

## 2022-11-05 DIAGNOSIS — Z5189 Encounter for other specified aftercare: Secondary | ICD-10-CM

## 2022-11-05 DIAGNOSIS — E1129 Type 2 diabetes mellitus with other diabetic kidney complication: Secondary | ICD-10-CM | POA: Diagnosis not present

## 2022-11-05 NOTE — Progress Notes (Signed)
HPI: The patient returns for weekly wound care of the left sternoclavicular joint.  It is slowly healing and by secondary intent.  Wound culture last week of the mucoid material at the bed of the wound was no growth.  The patient continues daily wet-to-dry packing with Vashe wound solution.  The wound depth is approximately 2.5 cm.  The patient's blood sugars are adequately controlled with his current insulin regimen including increase of his Lantus insulin to 45 units twice daily. No fever or increased pain.  Patient blood pressure still remains significantly elevated. His carvedilol dose was increased to 25 mg twice daily. He remains on his Norvasc 10 mg daily. His lisinopril 10 mg was increased to 20 mg but this resulted in incessant dry coughing so the lisinopril was stopped. His shortness of breath and pedal edema have improved on Lasix 40 mg daily for a 2-week course.  He will check in with his primary care physician for recommendations on antihypertensive medication.  He will continue to current wound care plan.  Next week we will inject some topical gel to accelerate connective tissue deposition. Current Outpatient Medications  Medication Sig Dispense Refill   acetaminophen (TYLENOL) 500 MG tablet Take 1,000 mg by mouth every 6 (six) hours as needed for moderate pain.     amLODipine (NORVASC) 10 MG tablet Take 10 mg by mouth in the morning.     atorvastatin (LIPITOR) 80 MG tablet Take 1 tablet (80 mg total) by mouth daily. 30 tablet 3   carvedilol (COREG) 12.5 MG tablet Take 1 tablet (12.5 mg total) by mouth 2 (two) times daily. 30 tablet 3   chlorpheniramine-HYDROcodone (TUSSIONEX) 10-8 MG/5ML Take 5 mLs by mouth every 12 (twelve) hours as needed for cough. 115 mL 0   furosemide (LASIX) 40 MG tablet Take 1 tablet (40 mg total) by mouth daily. 14 tablet 0   insulin glargine (LANTUS) 100 UNIT/ML Solostar Pen Inject 70 Units into the skin at bedtime. Take 40 units twice daily     Multiple  Vitamins-Minerals (PRESERVISION AREDS 2) CAPS Take 1 capsule by mouth 2 (two) times daily.     nitroGLYCERIN (NITROSTAT) 0.4 MG SL tablet Place 1 tablet (0.4 mg total) under the tongue every 5 (five) minutes x 3 doses as needed for chest pain (if no relief after 3rd dose, proceed to ED for evaluation or call 911). 25 tablet 3   NOVOLOG FLEXPEN 100 UNIT/ML FlexPen INJECT 25-31 UNITS INTO THE SKIN THREE TIMES DAILY BEFORE MEALS (Patient taking differently: Inject 50 Units into the skin in the morning, at noon, and at bedtime.) 30 mL 0   zinc gluconate 50 MG tablet Take 50 mg by mouth daily.     No current facility-administered medications for this visit.   Facility-Administered Medications Ordered in Other Visits  Medication Dose Route Frequency Provider Last Rate Last Admin   regadenoson (LEXISCAN) injection SOLN 0.4 mg  0.4 mg Intravenous Once Chilton Si, MD       technetium tetrofosmin (TC-MYOVIEW) injection 32.8 millicurie  32.8 millicurie Intravenous Once PRN Chilton Si, MD         Physical Exam: Blood pressure (!) 181/104, pulse 90, resp. rate 18, height 6\' 3"  (1.905 m), SpO2 99%.  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  Plan: Return for wound check and wound care in 1 week. Recommend visit with primary care for improving blood pressure control as last systolic blood pressures have all been greater than 170 mmHg.  Lovett Sox, MD [facility] [phone]

## 2022-11-11 ENCOUNTER — Encounter: Payer: Self-pay | Admitting: Cardiothoracic Surgery

## 2022-11-11 ENCOUNTER — Ambulatory Visit (INDEPENDENT_AMBULATORY_CARE_PROVIDER_SITE_OTHER): Payer: Medicare Other | Admitting: Cardiothoracic Surgery

## 2022-11-11 VITALS — BP 145/79 | HR 92 | Resp 20 | Wt 229.0 lb

## 2022-11-11 DIAGNOSIS — T8130XA Disruption of wound, unspecified, initial encounter: Secondary | ICD-10-CM

## 2022-11-11 DIAGNOSIS — M009 Pyogenic arthritis, unspecified: Secondary | ICD-10-CM

## 2022-11-11 MED ORDER — FUROSEMIDE 40 MG PO TABS: 40.0000 mg | ORAL_TABLET | Freq: Every day | ORAL | 0 refills | Status: DC

## 2022-11-11 NOTE — Progress Notes (Signed)
HPI:  The patient presents for scheduled wound care of the left sternoclavicular joint.  He has no complaints about the wound or daily dressing changes at home. His symptoms of cough, fatigue had improved-probably had a viral or mycoplasma URI.  His wife has been packing 1/2 inch Nu Gauze with Vashe wet-to-dry with good results.  The wound itself appears to be contracting in with healthy tissue and now measures approximately 2 cm deep.  Last culture was negative for bacterial growth.  I irrigated the wound with Vashe solution, stretch the skin opening to maintain patency and repacked with 1/2 inch Nu Gauze wet-to-dry with Vashe. Current Outpatient Medications  Medication Sig Dispense Refill   acetaminophen (TYLENOL) 500 MG tablet Take 1,000 mg by mouth every 6 (six) hours as needed for moderate pain.     amLODipine (NORVASC) 10 MG tablet Take 10 mg by mouth in the morning.     atorvastatin (LIPITOR) 80 MG tablet Take 1 tablet (80 mg total) by mouth daily. 30 tablet 3   carvedilol (COREG) 12.5 MG tablet Take 1 tablet (12.5 mg total) by mouth 2 (two) times daily. 30 tablet 3   chlorpheniramine-HYDROcodone (TUSSIONEX) 10-8 MG/5ML Take 5 mLs by mouth every 12 (twelve) hours as needed for cough. 115 mL 0   insulin glargine (LANTUS) 100 UNIT/ML Solostar Pen Inject 70 Units into the skin at bedtime. Take 40 units twice daily     Multiple Vitamins-Minerals (PRESERVISION AREDS 2) CAPS Take 1 capsule by mouth 2 (two) times daily.     nitroGLYCERIN (NITROSTAT) 0.4 MG SL tablet Place 1 tablet (0.4 mg total) under the tongue every 5 (five) minutes x 3 doses as needed for chest pain (if no relief after 3rd dose, proceed to ED for evaluation or call 911). 25 tablet 3   NOVOLOG FLEXPEN 100 UNIT/ML FlexPen INJECT 25-31 UNITS INTO THE SKIN THREE TIMES DAILY BEFORE MEALS (Patient taking differently: Inject 50 Units into the skin in the morning, at noon, and at bedtime.) 30 mL 0   zinc gluconate 50 MG tablet Take  50 mg by mouth daily.     furosemide (LASIX) 40 MG tablet Take 1 tablet (40 mg total) by mouth daily. 14 tablet 0   No current facility-administered medications for this visit.   Facility-Administered Medications Ordered in Other Visits  Medication Dose Route Frequency Provider Last Rate Last Admin   regadenoson (LEXISCAN) injection SOLN 0.4 mg  0.4 mg Intravenous Once Chilton Si, MD       technetium tetrofosmin (TC-MYOVIEW) injection 32.8 millicurie  32.8 millicurie Intravenous Once PRN Chilton Si, MD         Physical Exam: Blood pressure (!) 145/79, pulse 92, resp. rate 20, weight 229 lb (103.9 kg), SpO2 98%.   Alert and comfortable, feeling better Lungs clear No cellulitis around the left sternoclavicular wound Heart rate regular No ankle edema   Diagnostic Tests:  None  Impression: Wound is progressing now. Encouraging healthy diet and good dressing changes at home. Cellurate wound matrix gel is on  order and will be applied at the next clinic visit for wound care.  Plan: Return in 1 week for wound care and assessment of progress.   Lovett Sox, MD [facility] [phone]

## 2022-11-18 ENCOUNTER — Encounter: Payer: Self-pay | Admitting: Cardiothoracic Surgery

## 2022-11-18 ENCOUNTER — Ambulatory Visit (INDEPENDENT_AMBULATORY_CARE_PROVIDER_SITE_OTHER): Payer: Medicare Other | Admitting: Cardiothoracic Surgery

## 2022-11-18 VITALS — BP 132/70 | HR 75 | Resp 20 | Ht 75.0 in | Wt 233.0 lb

## 2022-11-18 DIAGNOSIS — M009 Pyogenic arthritis, unspecified: Secondary | ICD-10-CM | POA: Diagnosis not present

## 2022-11-18 DIAGNOSIS — T8130XA Disruption of wound, unspecified, initial encounter: Secondary | ICD-10-CM

## 2022-11-18 DIAGNOSIS — Z5189 Encounter for other specified aftercare: Secondary | ICD-10-CM

## 2022-11-18 NOTE — Progress Notes (Signed)
HPI: The patient returns for weekly wound care of the left sternoclavicular joint. Following debridement x 2 it is healing by secondary intent. Last wound culture x 2 has been negative and patient is finished with antibiotics. The wound is clean and slowly granulating, measures 2 cm deep and half a centimeter wide. He is performing daily Vashe wet-to-dry dressings with a 1 inch Nu Gauze.  Today I personally inspected the wound which she is described as above.  There is no purulence.  The tissue appears to be healthy.  After irrigating the wound with Vashe and blotting it dry I applied activated collagen gel to speed the healing process.  This was followed with wet-to-dry Nu Gauze with Vashe.  Current Outpatient Medications  Medication Sig Dispense Refill   acetaminophen (TYLENOL) 500 MG tablet Take 1,000 mg by mouth every 6 (six) hours as needed for moderate pain.     amLODipine (NORVASC) 10 MG tablet Take 10 mg by mouth in the morning.     atorvastatin (LIPITOR) 80 MG tablet Take 1 tablet (80 mg total) by mouth daily. 30 tablet 3   carvedilol (COREG) 12.5 MG tablet Take 1 tablet (12.5 mg total) by mouth 2 (two) times daily. 30 tablet 3   chlorpheniramine-HYDROcodone (TUSSIONEX) 10-8 MG/5ML Take 5 mLs by mouth every 12 (twelve) hours as needed for cough. 115 mL 0   furosemide (LASIX) 40 MG tablet Take 1 tablet (40 mg total) by mouth daily. 14 tablet 0   insulin glargine (LANTUS) 100 UNIT/ML Solostar Pen Inject 70 Units into the skin at bedtime. Take 40 units twice daily     Multiple Vitamins-Minerals (PRESERVISION AREDS 2) CAPS Take 1 capsule by mouth 2 (two) times daily.     nitroGLYCERIN (NITROSTAT) 0.4 MG SL tablet Place 1 tablet (0.4 mg total) under the tongue every 5 (five) minutes x 3 doses as needed for chest pain (if no relief after 3rd dose, proceed to ED for evaluation or call 911). 25 tablet 3   NOVOLOG FLEXPEN 100 UNIT/ML FlexPen INJECT 25-31 UNITS INTO THE SKIN THREE TIMES DAILY  BEFORE MEALS (Patient taking differently: Inject 50 Units into the skin in the morning, at noon, and at bedtime.) 30 mL 0   zinc gluconate 50 MG tablet Take 50 mg by mouth daily.     No current facility-administered medications for this visit.   Facility-Administered Medications Ordered in Other Visits  Medication Dose Route Frequency Provider Last Rate Last Admin   regadenoson (LEXISCAN) injection SOLN 0.4 mg  0.4 mg Intravenous Once Chilton Si, MD       technetium tetrofosmin (TC-MYOVIEW) injection 32.8 millicurie  32.8 millicurie Intravenous Once PRN Chilton Si, MD         Physical Exam:  Blood pressure 132/70, pulse 75, resp. rate 20, height 6\' 3"  (1.905 m), weight 233 lb (105.7 kg), SpO2 96%.       Exam    General- alert and comfortable    Neck- no JVD, no cervical adenopathy palpable, no carotid bruit   Lungs- clear without rales, wheezes   Cor- regular rate and rhythm, no murmur , gallop   Abdomen- soft, non-tender   Extremities - warm, non-tender, minimal edema   Neuro- oriented, appropriate, no focal weakness   Diagnostic Tests: None  Impression: Clean wound left sternoclavicular joint slowly healing Will start using weekly application of activated collagen in addition to the Vashe wet-to-dry dressing. Plan: Return for follow-up and wound care in 1 week.   Lovett Sox,  MD [facility] [phone]

## 2022-11-20 ENCOUNTER — Telehealth: Payer: Self-pay | Admitting: Cardiology

## 2022-11-20 DIAGNOSIS — L02213 Cutaneous abscess of chest wall: Secondary | ICD-10-CM | POA: Diagnosis not present

## 2022-11-20 DIAGNOSIS — M00012 Staphylococcal arthritis, left shoulder: Secondary | ICD-10-CM | POA: Diagnosis not present

## 2022-11-20 DIAGNOSIS — L02416 Cutaneous abscess of left lower limb: Secondary | ICD-10-CM | POA: Diagnosis not present

## 2022-11-20 DIAGNOSIS — B9561 Methicillin susceptible Staphylococcus aureus infection as the cause of diseases classified elsewhere: Secondary | ICD-10-CM | POA: Diagnosis not present

## 2022-11-20 DIAGNOSIS — T8141XA Infection following a procedure, superficial incisional surgical site, initial encounter: Secondary | ICD-10-CM | POA: Diagnosis not present

## 2022-11-20 NOTE — Telephone Encounter (Signed)
Pt c/o swelling/edema: STAT if pt has developed SOB within 24 hours  If swelling, where is the swelling located? Stomach and legs  How much weight have you gained and in what time span? Wife states he gained 14lbs in 3 days   Have you gained 2 pounds in a day or 5 pounds in a week? Yes   Do you have a log of your daily weights (if so, list)? no  Are you currently taking a fluid pill? Yes   Are you currently SOB? Yes, when he walks.  For the past two days.  Have you traveled recently in a car or plane for an extended period of time? No.  Wife states Dr. Maren Beach advised him to give [place] a called for him to be seen by [place].

## 2022-11-21 ENCOUNTER — Encounter: Payer: Self-pay | Admitting: Nurse Practitioner

## 2022-11-21 ENCOUNTER — Ambulatory Visit: Payer: Medicare Other | Attending: Nurse Practitioner | Admitting: Nurse Practitioner

## 2022-11-21 VITALS — BP 153/90 | HR 93 | Ht 76.0 in | Wt 232.4 lb

## 2022-11-21 DIAGNOSIS — I2510 Atherosclerotic heart disease of native coronary artery without angina pectoris: Secondary | ICD-10-CM | POA: Diagnosis not present

## 2022-11-21 DIAGNOSIS — I5022 Chronic systolic (congestive) heart failure: Secondary | ICD-10-CM

## 2022-11-21 DIAGNOSIS — R600 Localized edema: Secondary | ICD-10-CM

## 2022-11-21 DIAGNOSIS — I10 Essential (primary) hypertension: Secondary | ICD-10-CM | POA: Diagnosis not present

## 2022-11-21 DIAGNOSIS — I7121 Aneurysm of the ascending aorta, without rupture: Secondary | ICD-10-CM

## 2022-11-21 DIAGNOSIS — E782 Mixed hyperlipidemia: Secondary | ICD-10-CM | POA: Diagnosis not present

## 2022-11-21 DIAGNOSIS — Z79899 Other long term (current) drug therapy: Secondary | ICD-10-CM

## 2022-11-21 DIAGNOSIS — R0609 Other forms of dyspnea: Secondary | ICD-10-CM | POA: Diagnosis not present

## 2022-11-21 DIAGNOSIS — R21 Rash and other nonspecific skin eruption: Secondary | ICD-10-CM

## 2022-11-21 MED ORDER — FUROSEMIDE 40 MG PO TABS: 40.0000 mg | ORAL_TABLET | Freq: Every day | ORAL | 2 refills | Status: AC

## 2022-11-21 NOTE — Patient Instructions (Addendum)
Medication Instructions:  Your physician has recommended you make the following change in your medication:  Please Increase furosemide (LASIX) to 40 MG tablet twice daily for 3 days then reduce down to 40 mg daily   Labwork: In 1 week at [facility]   Testing/Procedures: None   Follow-Up: Your physician recommends that you schedule a follow-up appointment in: 4-6 weeks   Any Other Special Instructions Will Be Listed Below (If Applicable).  If you need a refill on your cardiac medications before your next appointment, please call your pharmacy.

## 2022-11-21 NOTE — Progress Notes (Addendum)
Office Visit    Patient Name: George [name] Date of Encounter: 11/21/2022 PCP:  Kirstie Peri, MD [facility]  Cardiologist:  Nona Dell, MD  Advanced Practice Provider:  No care team member to display Electrophysiologist:  None   Chief Complaint and HPI    George [name] is a 77 y.o. male with a hx of CAD, s/p CABG in 2014 (LIMA-LAD, SVG-ramus, SVG-OM 2), mixed hyperlipidemia, HFmrEF, type 2 diabetes, hypertension, GERD, ascending aortic aneurysm, and chronic back pain, who presents today for swelling evaluation.  Last seen by Dr. Diona Browner on June 19, 2020.  At that time, patient was noting more shortness of breath and fatigue with activity over the past several months.  Denied any chest pain.  Echo revealed EF 40 to 45%, full report below, was started on Entresto.    Seen by Rennis Harding, NP in September 2022 for follow-up. Was unable to perform Lexiscan Myoview due to history of claustrophobia.  Stress test arranged was deemed to be normal, however however was considered moderate risk due to decreased ventricular function, no evidence of ischemia noted.    05/02/2022 - He recently contacted our office noting left arm/chest pain with activity.  Noted shortness of breath after walking short distances, symptoms started 2 months ago and have gradually gotten worse over time per his report.  Today he presents for evaluation.  He states for the past few months, has noticed chest discomfort when doing strenuous activities/L arm pain, has gotten a little worse recently and decided to get checked out. Located in the middle of his chest, non-radiating. Unable to walk fast or walk uphill, also endorses [facility] with exertion. Has to sit down for relief of symptoms, symptoms are worse when continuing to exert himself.  5-6/10 in intensity. When reviewing meds with patient and wife, not taking Entresto for about the past year, was stopped by PCP. Denies any palpitations, syncope,  presyncope, dizziness, orthopnea, PND, swelling or significant weight changes, acute bleeding, or claudication. GXT arranged  - see report below. Echo revealed EF 50%, mild MR, dilated aorta.   Admitted 05/2022 for MSSA bacteremia, CT and MRI revealed inflammatory changes in left sternoclavicular joint with edema, no abscess. Received IV ABX, but pt left AMA. Home IV ABX supervised by PCP, BC were negative before leaving AMA. Readmitted 07/2022 due to large abscess with cortical bone destruction involving head of left clavicle, soft tissue abscess beneath skin measuring 3 cm. Underwent excisional debridement of left SCV joint abscess/fat/tissue/bone, also had wound vac placed. Went to OR for washout of left sternoclavicular joint abscess with application of tissue matrix powder and removal of wound Vac.   Today he presents for swelling evaluation. He has noticed more abdominal swelling and leg edema, says he has rash on left foot, unsure of what is causing rash. Says swelling has reduced and improved with taking Lasix as well as his shortness of breath. Denies any chest pain,  palpitations, syncope, presyncope, dizziness, orthopnea, PND, swelling or significant weight changes, acute bleeding, or claudication.   SH: Works as a [employer] Studies Reviewed:   The following studies were reviewed today:   EKG:  EKG is not ordered today.   TEE 05/2022: 1. Left ventricular ejection fraction, by estimation, is 45 to 50%. The  left ventricle has mildly decreased function. The left ventricle  demonstrates global hypokinesis.   2. Right ventricular systolic function is normal. The right ventricular  size is normal.  3. No left atrial/left atrial appendage thrombus was detected.   4. The mitral valve is normal in structure. Mild mitral valve  regurgitation. No evidence of mitral stenosis.   5. Eccentric anteriorly directed aortic regurgitation. The aortic valve  is tricuspid. Aortic valve  regurgitation is mild to moderate. No aortic  stenosis is present.   6. The inferior vena cava is normal in size with greater than 50%  respiratory variability, suggesting right atrial pressure of 3 mmHg.   Conclusion(s)/Recommendation(s): No evidence of vegetation/infective  endocarditis on this transesophageael echocardiogram.  Echo 05/2022:  1. Left ventricular ejection fraction, by estimation, is 50%. The left  ventricle has mildly decreased function. Left ventricular endocardial  border not optimally defined to evaluate regional wall motion. There is  moderate left ventricular hypertrophy.  Left ventricular diastolic parameters are indeterminate. Elevated left  atrial pressure.   2. Right ventricular systolic function is normal. The right ventricular  size is normal. Tricuspid regurgitation signal is inadequate for assessing  PA pressure.   3. The mitral valve is abnormal. Mild mitral valve regurgitation. No  evidence of mitral stenosis.   4. The aortic valve is tricuspid. There is mild calcification of the  aortic valve. There is mild thickening of the aortic valve. Aortic valve  regurgitation is trivial. No aortic stenosis is present.   5. Aortic dilatation noted. There is mild dilatation of the aortic root,  measuring 42 mm. There is mild dilatation of the ascending aorta,  measuring 40 mm.   Comparison(s): Echocardiogram done 07/05/20 showed an EF of 40-45%.  Lexiscan 04/2022:   Findings are consistent with prior infarction. The study is intermediate risk due to reduced systolic function.   No ST deviation was noted.   LV perfusion is abnormal. There is no evidence of ischemia. There is evidence of infarction. Defect 1: There is a medium defect with moderate reduction in uptake present in the apical to basal location(s) that is fixed. There is abnormal wall motion in the defect area. Consistent with infarction and artifact.   Left ventricular function is abnormal. Global  function is moderately reduced. There were multiple regional abnormalities. Nuclear stress EF: 38 %. The left ventricular ejection fraction is moderately decreased (30-44%). End diastolic cavity size is moderately enlarged. End systolic cavity size is moderately enlarged.   Prior study available for comparison from 10/16/2020.  No significant changes.  Myoview 10/2020:   The study is normal. The study is intermediate risk.   No ST deviation was noted.   Left ventricular function is abnormal. Global function is mildly reduced.   Prior study not available for comparison.   Findings: Negative for stress induced arrhythmias. Hypertensive at baseline. No evidence of ischemia or infarction. LV function is globally, mildly reduced.  LVEF 48%.   Conclusions: Stress test is negative. This is consistent with a moderate risk exam due to decreased ventricular function.   Echo 06/2020:   1. Inferior hypokinesis abnormal septal motion with hypokinesis . Left  ventricular ejection fraction, by estimation, is 40 to 45%. The left  ventricle has mildly decreased function. The left ventricle demonstrates  regional wall motion abnormalities (see  scoring diagram/findings for description). The left ventricular internal  cavity size was mildly dilated. There is moderate asymmetric left  ventricular hypertrophy of the basal and septal segments. Left ventricular  diastolic parameters are consistent with   Grade I diastolic dysfunction (impaired relaxation).   2. Right ventricular systolic function is normal. The right ventricular  size is  normal. There is normal pulmonary artery systolic pressure.   3. Left atrial size was moderately dilated.   4. The mitral valve is abnormal. Trivial mitral valve regurgitation. No  evidence of mitral stenosis.   5. The aortic valve is tricuspid. Aortic valve regurgitation is trivial.  Mild aortic valve sclerosis is present, with no evidence of aortic valve  stenosis.    6. Aortic dilatation noted. There is mild dilatation of the ascending  aorta, measuring 41 mm.   7. The inferior vena cava is normal in size with greater than 50%  respiratory variability, suggesting right atrial pressure of 3 mmHg.   Review of Systems    All other systems reviewed and are otherwise negative except as noted above.  Physical Exam    VS:  BP (!) 153/62 (BP Location: Left Arm, Patient Position: Sitting, Cuff Size: Normal)   Pulse 93   Ht 6\' 4"  (1.93 m)   Wt 232 lb 6.4 oz (105.4 kg)   SpO2 96%   BMI 28.29 kg/m  , BMI Body mass index is 28.29 kg/m.  Wt Readings from Last 3 Encounters:  11/22/22 227 lb (103 kg)  11/21/22 232 lb 6.4 oz (105.4 kg)  11/18/22 233 lb (105.7 kg)    GEN: Well nourished, well developed, 77 y.o. male in no acute distress. HEENT: normal. Neck: Supple, no JVD, carotid bruits, or masses. Cardiac: S1/S2, RRR, no murmurs, rubs, or gallops. No clubbing, cyanosis.  Some abdominal swelling on exam, trace edema to BLE. Radials/PT 2+ and equal bilaterally.  Respiratory:  Respirations regular and unlabored, clear to auscultation bilaterally. MS: No deformity or atrophy. Skin: Warm and dry, rash to left lower foot, mild skin redness to chest, dressing on chest is clean, dry, and intact. Neuro:  Strength and sensation are intact. Psych: Normal affect.  Assessment & Plan    CAD, s/p CABG Stable with no anginal symptoms. No indication for ischemic evaluation. No significant changes to recent Lexiscan from previous study. Myoview in 2022 negative for ischemia. Continue current medication regimen. Heart healthy diet encouraged. ED precautions discussed.   2. HFmrEF, DOE, peripheral edema, medication management TEE 05/2022 revealed EF 45-50%, no evidence of vegetation/infective endocarditis. Does show abdominal swelling and trace edema to BLE. Will increase Lasix to 40 mg BID x 3 days, then return to Lasix 40 mg daily and obtain BMET, proBNP, and Magnesium  in 1 week. No other medication changes at this time. Low sodium diet, fluid restriction <2L, and daily weights encouraged. Educated to contact our office for weight gain of 2 lbs overnight or 5 lbs in one week.  3. Mixed HLD LDL 47 04/2022. Continue Lipitor. Heart healthy diet encouraged.   4. HTN BP elevated today. Discussed SBP < 140. He will let us know in 2 weeks if SBP is not at goal. Continue current medication regimen. Discussed to monitor BP at home at least 2 hours after medications and sitting for 5-10 minutes. Heart healthy diet encouraged.   5. Ascending aortic aneurysm Mild dilatation noted of ascending aorta on Echo 05/2022 noted, measuring 40 mm and aortic root was found to be mildly dilated at 42 mm. Care precautions and ED precautions discussed. Heart healthy diet encouraged.   6. Rash Etiology unclear. Recommended to f/u with PCP for management of this. Continue to follow with PCP.  Disposition: Follow up in 4-6 week(s) with Nona Dell, MD or APP.  Signed, Sharlene Dory, NP

## 2022-11-22 ENCOUNTER — Ambulatory Visit (INDEPENDENT_AMBULATORY_CARE_PROVIDER_SITE_OTHER): Payer: Medicare Other | Admitting: Cardiothoracic Surgery

## 2022-11-22 ENCOUNTER — Encounter: Payer: Self-pay | Admitting: Cardiothoracic Surgery

## 2022-11-22 VITALS — BP 185/92 | HR 83 | Resp 18 | Ht 76.0 in | Wt 227.0 lb

## 2022-11-22 DIAGNOSIS — Z5189 Encounter for other specified aftercare: Secondary | ICD-10-CM

## 2022-11-22 DIAGNOSIS — M009 Pyogenic arthritis, unspecified: Secondary | ICD-10-CM

## 2022-11-22 MED ORDER — HYDRALAZINE HCL 25 MG PO TABS
25.0000 mg | ORAL_TABLET | Freq: Three times a day (TID) | ORAL | 1 refills | Status: DC
Start: 2022-11-22 — End: 2023-01-13

## 2022-11-22 NOTE — Progress Notes (Signed)
HPI: The patient returns for scheduled wound check. The patient's wife is having trouble packing the wound with half-inch Nu Gauze-the wound is narrowing and contracting after placing activated collagen at the last visit.  I gave the wife some quarter inch packing to use along with the Vashe.  Patient's blood pressure is acceptably high as 185 systolic. He cannot tolerate ARB-ACE-I due to symptomatic dry coughing.  I have started him on low-dose Apresoline.  He states his fluid status and abdominal tightness is better after having his Lasix dose increased.  Current Outpatient Medications  Medication Sig Dispense Refill   acetaminophen (TYLENOL) 500 MG tablet Take 1,000 mg by mouth every 6 (six) hours as needed for moderate pain.     amLODipine (NORVASC) 10 MG tablet Take 10 mg by mouth in the morning.     atorvastatin (LIPITOR) 80 MG tablet Take 1 tablet (80 mg total) by mouth daily. 30 tablet 3   carvedilol (COREG) 12.5 MG tablet Take 1 tablet (12.5 mg total) by mouth 2 (two) times daily. 30 tablet 3   chlorpheniramine-HYDROcodone (TUSSIONEX) 10-8 MG/5ML Take 5 mLs by mouth every 12 (twelve) hours as needed for cough. 115 mL 0   furosemide (LASIX) 40 MG tablet Take 1 tablet (40 mg total) by mouth daily. 90 tablet 2   hydrALAZINE (APRESOLINE) 25 MG tablet Take 1 tablet (25 mg total) by mouth 3 (three) times daily. 90 tablet 1   insulin glargine (LANTUS) 100 UNIT/ML Solostar Pen Inject 70 Units into the skin at bedtime. Take 40 units twice daily     Multiple Vitamins-Minerals (PRESERVISION AREDS 2) CAPS Take 1 capsule by mouth 2 (two) times daily.     nitroGLYCERIN (NITROSTAT) 0.4 MG SL tablet Place 1 tablet (0.4 mg total) under the tongue every 5 (five) minutes x 3 doses as needed for chest pain (if no relief after 3rd dose, proceed to ED for evaluation or call 911). 25 tablet 3   NOVOLOG FLEXPEN 100 UNIT/ML FlexPen INJECT 25-31 UNITS INTO THE SKIN THREE TIMES DAILY BEFORE MEALS (Patient taking  differently: Inject 50 Units into the skin in the morning, at noon, and at bedtime.) 30 mL 0   zinc gluconate 50 MG tablet Take 50 mg by mouth daily.     No current facility-administered medications for this visit.   Facility-Administered Medications Ordered in Other Visits  Medication Dose Route Frequency Provider Last Rate Last Admin   regadenoson (LEXISCAN) injection SOLN 0.4 mg  0.4 mg Intravenous Once Chilton Si, MD       technetium tetrofosmin (TC-MYOVIEW) injection 32.8 millicurie  32.8 millicurie Intravenous Once PRN Chilton Si, MD         Physical Exam:  Blood pressure (!) 185/92, pulse 83, resp. rate 18, height 6\' 4"  (1.93 m), weight 227 lb (103 kg), SpO2 94%.      General- alert and comfortable.  Wound at the left sternoclavicular joint is contracting and is quarter inch wide by half inch deep.  No purulence.    Neck- no JVD, no cervical adenopathy palpable, no carotid bruit   Lungs- clear without rales, wheezes   Cor- regular rate and rhythm, no murmur , gallop   Abdomen- soft, non-tender   Extremities - warm, non-tender, minimal edema   Neuro- oriented, appropriate, no focal weakness   Diagnostic Tests: None  Impression: Left sternoclavicular joint wound slowly healing.  Continue daily packing with Vashe wet-to-dry gauze at home  Patient with fluid retention from hypertension and baseline EF  45-50%.  He was seen by the cardiology office and will continue to be evaluated for possible diastolic heart failure.  Plan: Patient will continue be seen every week for wound care of the left sternoclavicular joint.  Home health is still following the patient and providing him with supplies.   Lovett Sox, MD [facility] [phone]

## 2022-11-24 ENCOUNTER — Encounter: Payer: Self-pay | Admitting: Nurse Practitioner

## 2022-11-25 ENCOUNTER — Other Ambulatory Visit: Payer: Self-pay | Admitting: Cardiothoracic Surgery

## 2022-11-27 ENCOUNTER — Other Ambulatory Visit: Payer: Self-pay | Admitting: Nurse Practitioner

## 2022-11-29 ENCOUNTER — Encounter: Payer: Self-pay | Admitting: Cardiothoracic Surgery

## 2022-11-29 ENCOUNTER — Ambulatory Visit (INDEPENDENT_AMBULATORY_CARE_PROVIDER_SITE_OTHER): Payer: Medicare Other | Admitting: Cardiothoracic Surgery

## 2022-11-29 VITALS — BP 178/75 | HR 86 | Resp 18 | Wt 228.0 lb

## 2022-11-29 DIAGNOSIS — Z5189 Encounter for other specified aftercare: Secondary | ICD-10-CM

## 2022-11-29 DIAGNOSIS — M009 Pyogenic arthritis, unspecified: Secondary | ICD-10-CM | POA: Diagnosis not present

## 2022-11-29 NOTE — Progress Notes (Signed)
HPI: Patient presents for scheduled office visit for wound care of the left sternoclavicular joint. Today the patient is feeling well without symptoms of CHF, blood pressure moderately elevated. His wife continues to pack the wound daily with quarter inch gauze soaked in Vashe solution. No fever, cough, left shoulder without pain     Physical Exam: Vitals:   11/29/22 0912  BP: (!) 178/75  Pulse: 86  Resp: 18  SpO2: 99%   Lungs clear Heart rate regular without murmur Minimal ankle edema   I personally examined and repacked the left sternoclavicular joint wound today. It is clean with adequate access from the skin and a depth of only 1.5 cm.  I packed quarter inch gauze soaked in Vashe wound solution into the wound after application of some Cellurate  activated collagen.  Continue daily packing with Vashe wet-to-dry quarter inch gauze. Continue weekly clinic visits to assess progress and for wound care.

## 2022-12-03 DIAGNOSIS — L02213 Cutaneous abscess of chest wall: Secondary | ICD-10-CM | POA: Diagnosis not present

## 2022-12-03 DIAGNOSIS — B9561 Methicillin susceptible Staphylococcus aureus infection as the cause of diseases classified elsewhere: Secondary | ICD-10-CM | POA: Diagnosis not present

## 2022-12-03 DIAGNOSIS — M00012 Staphylococcal arthritis, left shoulder: Secondary | ICD-10-CM | POA: Diagnosis not present

## 2022-12-03 DIAGNOSIS — L02416 Cutaneous abscess of left lower limb: Secondary | ICD-10-CM | POA: Diagnosis not present

## 2022-12-03 DIAGNOSIS — T8141XA Infection following a procedure, superficial incisional surgical site, initial encounter: Secondary | ICD-10-CM | POA: Diagnosis not present

## 2022-12-06 ENCOUNTER — Ambulatory Visit (INDEPENDENT_AMBULATORY_CARE_PROVIDER_SITE_OTHER): Payer: Medicare Other | Admitting: Cardiothoracic Surgery

## 2022-12-06 ENCOUNTER — Encounter: Payer: Self-pay | Admitting: Cardiothoracic Surgery

## 2022-12-06 VITALS — BP 173/75 | HR 89 | Resp 18 | Ht 76.0 in | Wt 229.0 lb

## 2022-12-06 DIAGNOSIS — Z5189 Encounter for other specified aftercare: Secondary | ICD-10-CM

## 2022-12-06 DIAGNOSIS — Z4801 Encounter for change or removal of surgical wound dressing: Secondary | ICD-10-CM | POA: Diagnosis not present

## 2022-12-06 NOTE — Progress Notes (Signed)
HPI: The patient returns for scheduled visit for wound care.  He is packing the left sternoclavicular joint wound with quarter inch Nu Gauze wet-to-dry with Vashe wound solution every day. He has no complaints.  Inspection of the wound today reveals it to be about 2 cm deep and very narrow at the skin.  The skin was stretched apart with a sterile hemostat, the wound was irrigated with Vashe solution, and 1/4 inch Nu Gauze soaked in Vashe solution was packed covered with a 2 x 2 gauze.  There is no purulence.  Previous cultures have been negative and repeated.  Current Outpatient Medications  Medication Sig Dispense Refill   acetaminophen (TYLENOL) 500 MG tablet Take 1,000 mg by mouth every 6 (six) hours as needed for moderate pain.     amLODipine (NORVASC) 10 MG tablet Take 10 mg by mouth in the morning.     atorvastatin (LIPITOR) 80 MG tablet Take 1 tablet (80 mg total) by mouth daily. 30 tablet 3   carvedilol (COREG) 12.5 MG tablet TAKE 1 TABLET BY MOUTH TWICE DAILY 30 tablet 3   chlorpheniramine-HYDROcodone (TUSSIONEX) 10-8 MG/5ML Take 5 mLs by mouth every 12 (twelve) hours as needed for cough. 115 mL 0   furosemide (LASIX) 40 MG tablet Take 1 tablet (40 mg total) by mouth daily. 90 tablet 2   hydrALAZINE (APRESOLINE) 25 MG tablet Take 1 tablet (25 mg total) by mouth 3 (three) times daily. 90 tablet 1   insulin glargine (LANTUS) 100 UNIT/ML Solostar Pen Inject 70 Units into the skin at bedtime. Take 40 units twice daily     Multiple Vitamins-Minerals (PRESERVISION AREDS 2) CAPS Take 1 capsule by mouth 2 (two) times daily.     nitroGLYCERIN (NITROSTAT) 0.4 MG SL tablet Place 1 tablet (0.4 mg total) under the tongue every 5 (five) minutes x 3 doses as needed for chest pain (if no relief after 3rd dose, proceed to ED for evaluation or call 911). 25 tablet 3   NOVOLOG FLEXPEN 100 UNIT/ML FlexPen INJECT 25-31 UNITS INTO THE SKIN THREE TIMES DAILY BEFORE MEALS (Patient taking differently: Inject 50  Units into the skin in the morning, at noon, and at bedtime.) 30 mL 0   zinc gluconate 50 MG tablet Take 50 mg by mouth daily.     No current facility-administered medications for this visit.   Facility-Administered Medications Ordered in Other Visits  Medication Dose Route Frequency Provider Last Rate Last Admin   regadenoson (LEXISCAN) injection SOLN 0.4 mg  0.4 mg Intravenous Once Chilton Si, MD       technetium tetrofosmin (TC-MYOVIEW) injection 32.8 millicurie  32.8 millicurie Intravenous Once PRN Chilton Si, MD         Physical Exam: Blood pressure (!) 173/75, pulse 89, resp. rate 18, height 6\' 4"  (1.93 m), weight 229 lb (103.9 kg), SpO2 95%.   Patient is alert and comfortable Heart rate is regular Lungs are clear Left [place] joint without surrounding erythema or tissue induration Wound care performed as described above  Diagnostic Tests: None  Impression: Slow to heal tunneled wound into the left sternoclavicular joint. Cultures have been negative. Continue daily wound care packing the wound with quarter inch Nu Gauze soaked in Vashe wound solution.  Plan: Return in December 2 for wound inspection and wound care.   Lovett Sox, MD [facility] [phone]

## 2022-12-13 DIAGNOSIS — L02416 Cutaneous abscess of left lower limb: Secondary | ICD-10-CM | POA: Diagnosis not present

## 2022-12-13 DIAGNOSIS — T8141XA Infection following a procedure, superficial incisional surgical site, initial encounter: Secondary | ICD-10-CM | POA: Diagnosis not present

## 2022-12-13 DIAGNOSIS — M00012 Staphylococcal arthritis, left shoulder: Secondary | ICD-10-CM | POA: Diagnosis not present

## 2022-12-13 DIAGNOSIS — L02213 Cutaneous abscess of chest wall: Secondary | ICD-10-CM | POA: Diagnosis not present

## 2022-12-13 DIAGNOSIS — B9561 Methicillin susceptible Staphylococcus aureus infection as the cause of diseases classified elsewhere: Secondary | ICD-10-CM | POA: Diagnosis not present

## 2022-12-16 ENCOUNTER — Ambulatory Visit (INDEPENDENT_AMBULATORY_CARE_PROVIDER_SITE_OTHER): Payer: Medicare Other | Admitting: Cardiothoracic Surgery

## 2022-12-16 ENCOUNTER — Encounter: Payer: Self-pay | Admitting: Cardiothoracic Surgery

## 2022-12-16 VITALS — BP 125/66 | HR 74 | Resp 20 | Ht 76.0 in | Wt 227.0 lb

## 2022-12-16 DIAGNOSIS — Z5189 Encounter for other specified aftercare: Secondary | ICD-10-CM

## 2022-12-16 DIAGNOSIS — Z4801 Encounter for change or removal of surgical wound dressing: Secondary | ICD-10-CM

## 2022-12-16 DIAGNOSIS — M009 Pyogenic arthritis, unspecified: Secondary | ICD-10-CM

## 2022-12-16 NOTE — Progress Notes (Signed)
HPI: The patient returns for scheduled visit for surgical dressing change of the left sternoclavicular joint wound.  It is being packed with quarter inch gauze soaked in Vashe wound solution daily.  He has no complaints.  There has been minimal drainage. His blood pressure and diabetes appear to be under better control and he has no symptoms of heart failure.  Today I found the wound to be clean and approximately 1.5 cm deep.  It was cleaned with Vashe and packed with quarter inch gauze strip.  There is no surrounding erythema. Current Outpatient Medications  Medication Sig Dispense Refill   acetaminophen (TYLENOL) 500 MG tablet Take 1,000 mg by mouth every 6 (six) hours as needed for moderate pain.     amLODipine (NORVASC) 10 MG tablet Take 10 mg by mouth in the morning.     atorvastatin (LIPITOR) 80 MG tablet Take 1 tablet (80 mg total) by mouth daily. 30 tablet 3   carvedilol (COREG) 12.5 MG tablet TAKE 1 TABLET BY MOUTH TWICE DAILY 30 tablet 3   chlorpheniramine-HYDROcodone (TUSSIONEX) 10-8 MG/5ML Take 5 mLs by mouth every 12 (twelve) hours as needed for cough. 115 mL 0   furosemide (LASIX) 40 MG tablet Take 1 tablet (40 mg total) by mouth daily. 90 tablet 2   hydrALAZINE (APRESOLINE) 25 MG tablet Take 1 tablet (25 mg total) by mouth 3 (three) times daily. 90 tablet 1   insulin glargine (LANTUS) 100 UNIT/ML Solostar Pen Inject 70 Units into the skin at bedtime. Take 40 units twice daily     Multiple Vitamins-Minerals (PRESERVISION AREDS 2) CAPS Take 1 capsule by mouth 2 (two) times daily.     nitroGLYCERIN (NITROSTAT) 0.4 MG SL tablet Place 1 tablet (0.4 mg total) under the tongue every 5 (five) minutes x 3 doses as needed for chest pain (if no relief after 3rd dose, proceed to ED for evaluation or call 911). 25 tablet 3   NOVOLOG FLEXPEN 100 UNIT/ML FlexPen INJECT 25-31 UNITS INTO THE SKIN THREE TIMES DAILY BEFORE MEALS (Patient taking differently: Inject 50 Units into the skin in the morning,  at noon, and at bedtime.) 30 mL 0   zinc gluconate 50 MG tablet Take 50 mg by mouth daily.     No current facility-administered medications for this visit.   Facility-Administered Medications Ordered in Other Visits  Medication Dose Route Frequency Provider Last Rate Last Admin   regadenoson (LEXISCAN) injection SOLN 0.4 mg  0.4 mg Intravenous Once Chilton Si, MD       technetium tetrofosmin (TC-MYOVIEW) injection 32.8 millicurie  32.8 millicurie Intravenous Once PRN Chilton Si, MD         Physical Exam: Blood pressure 125/66, pulse 74, resp. rate 20, height 6\' 4"  (1.93 m), weight 227 lb (103 kg), SpO2 98%.   Patient alert and comfortable Sinus rhythm without murmur Lungs clear No tenderness of the upper chest wall at the wound site. Minimal peripheral edema Diagnostic Tests:   Impression: Sternoclavicular joint wound healing with current care. Cultures have been negative. Patient understands importance of keeping the skin opening patent for a narrow soft tissue wound beneath the skin. Plan: Return for wound care in 10 days.   Lovett Sox, MD [facility] [phone]

## 2022-12-18 DIAGNOSIS — M4802 Spinal stenosis, cervical region: Secondary | ICD-10-CM | POA: Diagnosis not present

## 2022-12-18 DIAGNOSIS — L02213 Cutaneous abscess of chest wall: Secondary | ICD-10-CM | POA: Diagnosis not present

## 2022-12-18 DIAGNOSIS — M50322 Other cervical disc degeneration at C5-C6 level: Secondary | ICD-10-CM | POA: Diagnosis not present

## 2022-12-18 DIAGNOSIS — T8141XD Infection following a procedure, superficial incisional surgical site, subsequent encounter: Secondary | ICD-10-CM | POA: Diagnosis not present

## 2022-12-18 DIAGNOSIS — E119 Type 2 diabetes mellitus without complications: Secondary | ICD-10-CM | POA: Diagnosis not present

## 2022-12-19 ENCOUNTER — Ambulatory Visit: Payer: Medicare Other | Attending: Nurse Practitioner | Admitting: Nurse Practitioner

## 2022-12-19 NOTE — Progress Notes (Unsigned)
Office Visit    Patient Name: George [name] Date of Encounter: 11/21/2022 PCP:  Kirstie Peri, MD [facility]  Cardiologist:  Nona Dell, MD  Advanced Practice Provider:  No care team member to display Electrophysiologist:  None   Chief Complaint and HPI    George [name] is a 77 y.o. male with a hx of CAD, s/p CABG in 2014 (LIMA-LAD, SVG-ramus, SVG-OM 2), mixed hyperlipidemia, HFmrEF, type 2 diabetes, hypertension, GERD, ascending aortic aneurysm, and chronic back pain, who presents today for swelling evaluation.  Last seen by Dr. Diona Browner on June 19, 2020.  At that time, patient was noting more shortness of breath and fatigue with activity over the past several months.  Denied any chest pain.  Echo revealed EF 40 to 45%, full report below, was started on Entresto.    Seen by Rennis Harding, NP in September 2022 for follow-up. Was unable to perform Lexiscan Myoview due to history of claustrophobia.  Stress test arranged was deemed to be normal, however however was considered moderate risk due to decreased ventricular function, no evidence of ischemia noted.    05/02/2022 - He recently contacted our office noting left arm/chest pain with activity.  Noted shortness of breath after walking short distances, symptoms started 2 months ago and have gradually gotten worse over time per his report.  Today he presents for evaluation.  He states for the past few months, has noticed chest discomfort when doing strenuous activities/L arm pain, has gotten a little worse recently and decided to get checked out. Located in the middle of his chest, non-radiating. Unable to walk fast or walk uphill, also endorses [place] [place] with exertion. Has to sit down for relief of symptoms, symptoms are worse when continuing to exert himself.  5-6/10 in intensity. When reviewing meds with patient and wife, not taking Entresto for about the past year, was stopped by PCP. Denies any palpitations, syncope,  presyncope, dizziness, orthopnea, PND, swelling or significant weight changes, acute bleeding, or claudication. GXT arranged  - see report below. Echo revealed EF 50%, mild MR, dilated aorta.   Admitted 05/2022 for MSSA bacteremia, CT and MRI revealed inflammatory changes in left sternoclavicular joint with edema, no abscess. Received IV ABX, but pt left AMA. Home IV ABX supervised by PCP, BC were negative before leaving AMA. Readmitted 07/2022 due to large abscess with cortical bone destruction involving head of left clavicle, soft tissue abscess beneath skin measuring 3 cm. Underwent excisional debridement of left SCV joint abscess/fat/tissue/bone, also had wound vac placed. Went to OR for washout of left sternoclavicular joint abscess with application of tissue matrix powder and removal of wound Vac.   Today he presents for swelling evaluation. He has noticed more abdominal swelling and leg edema, says he has rash on left foot, unsure of what is causing rash. Says swelling has reduced and improved with taking Lasix as well as his shortness of breath. Denies any chest pain,  palpitations, syncope, presyncope, dizziness, orthopnea, PND, swelling or significant weight changes, acute bleeding, or claudication.   SH: Works as a [employer] Studies Reviewed:   The following studies were reviewed today:   EKG:  EKG is not ordered today.   TEE 05/2022: 1. Left ventricular ejection fraction, by estimation, is 45 to 50%. The  left ventricle has mildly decreased function. The left ventricle  demonstrates global hypokinesis.   2. Right ventricular systolic function is normal. The right ventricular  size is normal.  3. No left atrial/left atrial appendage thrombus was detected.   4. The mitral valve is normal in structure. Mild mitral valve  regurgitation. No evidence of mitral stenosis.   5. Eccentric anteriorly directed aortic regurgitation. The aortic valve  is tricuspid. Aortic valve  regurgitation is mild to moderate. No aortic  stenosis is present.   6. The inferior vena cava is normal in size with greater than 50%  respiratory variability, suggesting right atrial pressure of 3 mmHg.   Conclusion(s)/Recommendation(s): No evidence of vegetation/infective  endocarditis on this transesophageael echocardiogram.  Echo 05/2022:  1. Left ventricular ejection fraction, by estimation, is 50%. The left  ventricle has mildly decreased function. Left ventricular endocardial  border not optimally defined to evaluate regional wall motion. There is  moderate left ventricular hypertrophy.  Left ventricular diastolic parameters are indeterminate. Elevated left  atrial pressure.   2. Right ventricular systolic function is normal. The right ventricular  size is normal. Tricuspid regurgitation signal is inadequate for assessing  PA pressure.   3. The mitral valve is abnormal. Mild mitral valve regurgitation. No  evidence of mitral stenosis.   4. The aortic valve is tricuspid. There is mild calcification of the  aortic valve. There is mild thickening of the aortic valve. Aortic valve  regurgitation is trivial. No aortic stenosis is present.   5. Aortic dilatation noted. There is mild dilatation of the aortic root,  measuring 42 mm. There is mild dilatation of the ascending aorta,  measuring 40 mm.   Comparison(s): Echocardiogram done 07/05/20 showed an EF of 40-45%.  Lexiscan 04/2022:   Findings are consistent with prior infarction. The study is intermediate risk due to reduced systolic function.   No ST deviation was noted.   LV perfusion is abnormal. There is no evidence of ischemia. There is evidence of infarction. Defect 1: There is a medium defect with moderate reduction in uptake present in the apical to basal location(s) that is fixed. There is abnormal wall motion in the defect area. Consistent with infarction and artifact.   Left ventricular function is abnormal. Global  function is moderately reduced. There were multiple regional abnormalities. Nuclear stress EF: 38 %. The left ventricular ejection fraction is moderately decreased (30-44%). End diastolic cavity size is moderately enlarged. End systolic cavity size is moderately enlarged.   Prior study available for comparison from 10/16/2020.  No significant changes.  Myoview 10/2020:   The study is normal. The study is intermediate risk.   No ST deviation was noted.   Left ventricular function is abnormal. Global function is mildly reduced.   Prior study not available for comparison.   Findings: Negative for stress induced arrhythmias. Hypertensive at baseline. No evidence of ischemia or infarction. LV function is globally, mildly reduced.  LVEF 48%.   Conclusions: Stress test is negative. This is consistent with a moderate risk exam due to decreased ventricular function.   Echo 06/2020:   1. Inferior hypokinesis abnormal septal motion with hypokinesis . Left  ventricular ejection fraction, by estimation, is 40 to 45%. The left  ventricle has mildly decreased function. The left ventricle demonstrates  regional wall motion abnormalities (see  scoring diagram/findings for description). The left ventricular internal  cavity size was mildly dilated. There is moderate asymmetric left  ventricular hypertrophy of the basal and septal segments. Left ventricular  diastolic parameters are consistent with   Grade I diastolic dysfunction (impaired relaxation).   2. Right ventricular systolic function is normal. The right ventricular  size is  normal. There is normal pulmonary artery systolic pressure.   3. Left atrial size was moderately dilated.   4. The mitral valve is abnormal. Trivial mitral valve regurgitation. No  evidence of mitral stenosis.   5. The aortic valve is tricuspid. Aortic valve regurgitation is trivial.  Mild aortic valve sclerosis is present, with no evidence of aortic valve  stenosis.    6. Aortic dilatation noted. There is mild dilatation of the ascending  aorta, measuring 41 mm.   7. The inferior vena cava is normal in size with greater than 50%  respiratory variability, suggesting right atrial pressure of 3 mmHg.   Review of Systems    All other systems reviewed and are otherwise negative except as noted above.  Physical Exam    VS:  There were no vitals taken for this visit. , BMI There is no height or weight on file to calculate BMI.  Wt Readings from Last 3 Encounters:  12/16/22 227 lb (103 kg)  12/06/22 229 lb (103.9 kg)  11/29/22 228 lb (103.4 kg)    GEN: Well nourished, well developed, 77 y.o. male in no acute distress. HEENT: normal. Neck: Supple, no JVD, carotid bruits, or masses. Cardiac: S1/S2, RRR, no murmurs, rubs, or gallops. No clubbing, cyanosis.  Some abdominal swelling on exam, trace edema to BLE. Radials/PT 2+ and equal bilaterally.  Respiratory:  Respirations regular and unlabored, clear to auscultation bilaterally. MS: No deformity or atrophy. Skin: Warm and dry, rash to left lower foot, mild skin redness to chest, dressing on chest is clean, dry, and intact. Neuro:  Strength and sensation are intact. Psych: Normal affect.  Assessment & Plan    CAD, s/p CABG Stable with no anginal symptoms. No indication for ischemic evaluation. No significant changes to recent Lexiscan from previous study. Myoview in 2022 negative for ischemia. Continue current medication regimen. Heart healthy diet encouraged. ED precautions discussed.   2. HFmrEF, DOE, peripheral edema, medication management TEE 05/2022 revealed EF 45-50%, no evidence of vegetation/infective endocarditis. Does show abdominal swelling and trace edema to BLE. Will increase Lasix to 40 mg BID x 3 days, then return to Lasix 40 mg daily and obtain BMET, proBNP, and Magnesium in 1 week. No other medication changes at this time. Low sodium diet, fluid restriction <2L, and daily weights encouraged.  Educated to contact our office for weight gain of 2 lbs overnight or 5 lbs in one week.  3. Mixed HLD LDL 47 04/2022. Continue Lipitor. Heart healthy diet encouraged.   4. HTN BP elevated today. Discussed SBP < 140. He will let us know in 2 weeks if SBP is not at goal. Continue current medication regimen. Discussed to monitor BP at home at least 2 hours after medications and sitting for 5-10 minutes. Heart healthy diet encouraged.   5. Ascending aortic aneurysm Mild dilatation noted of ascending aorta on Echo 05/2022 noted, measuring 40 mm and aortic root was found to be mildly dilated at 42 mm. Care precautions and ED precautions discussed. Heart healthy diet encouraged.   6. Rash Etiology unclear. Recommended to f/u with PCP for management of this. Continue to follow with PCP.  Disposition: Follow up in 4-6 week(s) with Nona Dell, MD or APP.  Signed, Sharlene Dory, NP

## 2022-12-23 DIAGNOSIS — M4802 Spinal stenosis, cervical region: Secondary | ICD-10-CM | POA: Diagnosis not present

## 2022-12-23 DIAGNOSIS — E119 Type 2 diabetes mellitus without complications: Secondary | ICD-10-CM | POA: Diagnosis not present

## 2022-12-23 DIAGNOSIS — I110 Hypertensive heart disease with heart failure: Secondary | ICD-10-CM | POA: Diagnosis not present

## 2022-12-23 DIAGNOSIS — L02213 Cutaneous abscess of chest wall: Secondary | ICD-10-CM | POA: Diagnosis not present

## 2022-12-25 DIAGNOSIS — E119 Type 2 diabetes mellitus without complications: Secondary | ICD-10-CM | POA: Diagnosis not present

## 2022-12-25 DIAGNOSIS — L02213 Cutaneous abscess of chest wall: Secondary | ICD-10-CM | POA: Diagnosis not present

## 2022-12-25 DIAGNOSIS — M4802 Spinal stenosis, cervical region: Secondary | ICD-10-CM | POA: Diagnosis not present

## 2022-12-25 DIAGNOSIS — M50322 Other cervical disc degeneration at C5-C6 level: Secondary | ICD-10-CM | POA: Diagnosis not present

## 2022-12-25 DIAGNOSIS — T8141XD Infection following a procedure, superficial incisional surgical site, subsequent encounter: Secondary | ICD-10-CM | POA: Diagnosis not present

## 2022-12-26 ENCOUNTER — Ambulatory Visit: Payer: Medicare Other | Admitting: Cardiothoracic Surgery

## 2022-12-26 ENCOUNTER — Encounter: Payer: Self-pay | Admitting: Cardiothoracic Surgery

## 2022-12-26 VITALS — BP 170/68 | HR 71 | Resp 20 | Wt 232.0 lb

## 2022-12-26 DIAGNOSIS — T8130XA Disruption of wound, unspecified, initial encounter: Secondary | ICD-10-CM | POA: Diagnosis not present

## 2022-12-26 DIAGNOSIS — Z4801 Encounter for change or removal of surgical wound dressing: Secondary | ICD-10-CM

## 2022-12-26 DIAGNOSIS — M009 Pyogenic arthritis, unspecified: Secondary | ICD-10-CM | POA: Diagnosis not present

## 2022-12-26 NOTE — Progress Notes (Signed)
HPI: The patient reports for scheduled wound care of the left sternoclavicular joint wound healing by secondary intent following surgical debridement x 2. The wound is clean and approximately 1 cm deep. There is no surrounding cellulitis.  There is minimal drainage. I performed with Vashe solution into the wound after removing the packing, dried the solution then applied activated collagen. I then placed a new quarter inch gauze with Vashe wet-to-dry and covered with a Tegaderm.  Patient's wife is currently ill with pneumonia and may require hospitalization. She has been the primary wound care provider at home so we will increase his clinic visits to twice a week. Current Outpatient Medications  Medication Sig Dispense Refill   acetaminophen (TYLENOL) 500 MG tablet Take 1,000 mg by mouth every 6 (six) hours as needed for moderate pain.     amLODipine (NORVASC) 10 MG tablet Take 10 mg by mouth in the morning.     atorvastatin (LIPITOR) 80 MG tablet Take 1 tablet (80 mg total) by mouth daily. 30 tablet 3   carvedilol (COREG) 12.5 MG tablet TAKE 1 TABLET BY MOUTH TWICE DAILY 30 tablet 3   chlorpheniramine-HYDROcodone (TUSSIONEX) 10-8 MG/5ML Take 5 mLs by mouth every 12 (twelve) hours as needed for cough. 115 mL 0   furosemide (LASIX) 40 MG tablet Take 1 tablet (40 mg total) by mouth daily. 90 tablet 2   hydrALAZINE (APRESOLINE) 25 MG tablet Take 1 tablet (25 mg total) by mouth 3 (three) times daily. 90 tablet 1   insulin glargine (LANTUS) 100 UNIT/ML Solostar Pen Inject 70 Units into the skin at bedtime. Take 40 units twice daily     Multiple Vitamins-Minerals (PRESERVISION AREDS 2) CAPS Take 1 capsule by mouth 2 (two) times daily.     nitroGLYCERIN (NITROSTAT) 0.4 MG SL tablet Place 1 tablet (0.4 mg total) under the tongue every 5 (five) minutes x 3 doses as needed for chest pain (if no relief after 3rd dose, proceed to ED for evaluation or call 911). 25 tablet 3   NOVOLOG FLEXPEN 100 UNIT/ML  FlexPen INJECT 25-31 UNITS INTO THE SKIN THREE TIMES DAILY BEFORE MEALS (Patient taking differently: Inject 50 Units into the skin in the morning, at noon, and at bedtime.) 30 mL 0   zinc gluconate 50 MG tablet Take 50 mg by mouth daily.     No current facility-administered medications for this visit.   Facility-Administered Medications Ordered in Other Visits  Medication Dose Route Frequency Provider Last Rate Last Admin   regadenoson (LEXISCAN) injection SOLN 0.4 mg  0.4 mg Intravenous Once Chilton Si, MD       technetium tetrofosmin (TC-MYOVIEW) injection 32.8 millicurie  32.8 millicurie Intravenous Once PRN Chilton Si, MD         Physical Exam: Vitals:   12/26/22 0940  BP: (!) 170/68  Pulse: 71  Resp: 20  SpO2: 96%  Alert and comfortable Lungs clear Heart rate regular Left sternoclavicular joint wound starting to narrow at the skin edge.  It was cleaned and packed as described as above.   Diagnostic Tests: None  Impression: Left sternoclavicular joint wound healing by secondary intent. Continue with daily Vashe wet-to-dry dressing changes. Will increase clinic visits to twice a week, with application of activated cellulose to try to facilitate closure. Plan: Return Monday, December 16.   Lovett Sox, MD [facility] [phone]

## 2022-12-30 ENCOUNTER — Telehealth: Payer: Self-pay | Admitting: *Deleted

## 2022-12-30 ENCOUNTER — Encounter: Payer: Self-pay | Admitting: Cardiothoracic Surgery

## 2022-12-30 ENCOUNTER — Other Ambulatory Visit: Payer: Self-pay | Admitting: *Deleted

## 2022-12-30 ENCOUNTER — Ambulatory Visit (INDEPENDENT_AMBULATORY_CARE_PROVIDER_SITE_OTHER): Payer: Medicare Other | Admitting: Cardiothoracic Surgery

## 2022-12-30 VITALS — BP 135/74 | HR 63 | Resp 18 | Ht 76.0 in | Wt 231.0 lb

## 2022-12-30 DIAGNOSIS — Z5189 Encounter for other specified aftercare: Secondary | ICD-10-CM | POA: Diagnosis not present

## 2022-12-30 DIAGNOSIS — M009 Pyogenic arthritis, unspecified: Secondary | ICD-10-CM

## 2022-12-30 MED ORDER — DOXYCYCLINE HYCLATE 100 MG PO TABS
100.0000 mg | ORAL_TABLET | Freq: Two times a day (BID) | ORAL | 0 refills | Status: DC
Start: 2022-12-30 — End: 2023-02-26

## 2022-12-30 NOTE — Telephone Encounter (Signed)
[facility] notified of routine specimen pick up. Confirmation number [id number].

## 2022-12-30 NOTE — Progress Notes (Signed)
HPI: Patient presents for weekly wound care in the clinic.  There has been little change in the wound despite adding activated collagen ointment.  His wife is packing with half-inch gauze strip soaked in Vashe wound solution.  Today he is complaining of some tenderness around the perimeter of the wound opening.  The wound still measures approximately 2 cm deep.  I took a surveillance culture today to make sure there is been no change in flora.  Minimal drainage, no evidence of cellulitis  Current Outpatient Medications  Medication Sig Dispense Refill   acetaminophen (TYLENOL) 500 MG tablet Take 1,000 mg by mouth every 6 (six) hours as needed for moderate pain.     amLODipine (NORVASC) 10 MG tablet Take 10 mg by mouth in the morning.     atorvastatin (LIPITOR) 80 MG tablet Take 1 tablet (80 mg total) by mouth daily. 30 tablet 3   carvedilol (COREG) 12.5 MG tablet TAKE 1 TABLET BY MOUTH TWICE DAILY 30 tablet 3   chlorpheniramine-HYDROcodone (TUSSIONEX) 10-8 MG/5ML Take 5 mLs by mouth every 12 (twelve) hours as needed for cough. 115 mL 0   doxycycline (VIBRA-TABS) 100 MG tablet Take 1 tablet (100 mg total) by mouth 2 (two) times daily. 14 tablet 0   furosemide (LASIX) 40 MG tablet Take 1 tablet (40 mg total) by mouth daily. 90 tablet 2   hydrALAZINE (APRESOLINE) 25 MG tablet Take 1 tablet (25 mg total) by mouth 3 (three) times daily. 90 tablet 1   insulin glargine (LANTUS) 100 UNIT/ML Solostar Pen Inject 70 Units into the skin at bedtime. Take 40 units twice daily     Multiple Vitamins-Minerals (PRESERVISION AREDS 2) CAPS Take 1 capsule by mouth 2 (two) times daily.     nitroGLYCERIN (NITROSTAT) 0.4 MG SL tablet Place 1 tablet (0.4 mg total) under the tongue every 5 (five) minutes x 3 doses as needed for chest pain (if no relief after 3rd dose, proceed to ED for evaluation or call 911). 25 tablet 3   NOVOLOG FLEXPEN 100 UNIT/ML FlexPen INJECT 25-31 UNITS INTO THE SKIN THREE TIMES DAILY BEFORE MEALS  (Patient taking differently: Inject 50 Units into the skin in the morning, at noon, and at bedtime.) 30 mL 0   zinc gluconate 50 MG tablet Take 50 mg by mouth daily.     No current facility-administered medications for this visit.   Facility-Administered Medications Ordered in Other Visits  Medication Dose Route Frequency Provider Last Rate Last Admin   regadenoson (LEXISCAN) injection SOLN 0.4 mg  0.4 mg Intravenous Once Chilton Si, MD       technetium tetrofosmin (TC-MYOVIEW) injection 32.8 millicurie  32.8 millicurie Intravenous Once PRN Chilton Si, MD         Physical Exam: Blood pressure 135/74, pulse 63, resp. rate 18, height 6\' 4"  (1.93 m), weight 231 lb (104.8 kg), SpO2 99%.  Alert and comfortable Lungs clear Heart rate regular without murmur Left chest wall wound opening unchanged.  Wound care completed with Vashe irrigation followed by packing with half-inch gauze soaked in Vashe wound solution and a Mepitel Dressing.  Diagnostic Tests: Wound culture obtained after cleaning the site with Betadine  Impression: Slow to heal sterno-clavicular abscess status post surgical debridement x 2.  Patient states his blood sugars under control.  Will follow-up on wound culture.  Plan: Return in 1 week for wound check at clinic.  Continue daily wound care at home with packing gauze strips soaked in Vashe solution.  Because there  has been minimal improvement in the wound over the past few weeks, I started the patient on a 1 week course of doxycycline 100 mg twice daily pending results of wound culture.   Lovett Sox, MD [facility] [phone]

## 2022-12-31 DIAGNOSIS — L02213 Cutaneous abscess of chest wall: Secondary | ICD-10-CM | POA: Diagnosis not present

## 2022-12-31 DIAGNOSIS — T8141XD Infection following a procedure, superficial incisional surgical site, subsequent encounter: Secondary | ICD-10-CM | POA: Diagnosis not present

## 2022-12-31 DIAGNOSIS — M4802 Spinal stenosis, cervical region: Secondary | ICD-10-CM | POA: Diagnosis not present

## 2022-12-31 DIAGNOSIS — M50322 Other cervical disc degeneration at C5-C6 level: Secondary | ICD-10-CM | POA: Diagnosis not present

## 2022-12-31 DIAGNOSIS — E119 Type 2 diabetes mellitus without complications: Secondary | ICD-10-CM | POA: Diagnosis not present

## 2023-01-02 LAB — WOUND CULTURE
MICRO NUMBER:: 15854890
SPECIMEN QUALITY:: ADEQUATE

## 2023-01-06 ENCOUNTER — Encounter: Payer: Self-pay | Admitting: Cardiothoracic Surgery

## 2023-01-06 ENCOUNTER — Other Ambulatory Visit: Payer: Self-pay | Admitting: Cardiothoracic Surgery

## 2023-01-06 ENCOUNTER — Ambulatory Visit (INDEPENDENT_AMBULATORY_CARE_PROVIDER_SITE_OTHER): Payer: Medicare Other | Admitting: Cardiothoracic Surgery

## 2023-01-06 VITALS — BP 160/80 | HR 85 | Resp 20 | Ht 76.0 in | Wt 230.0 lb

## 2023-01-06 DIAGNOSIS — M009 Pyogenic arthritis, unspecified: Secondary | ICD-10-CM

## 2023-01-06 DIAGNOSIS — Z5189 Encounter for other specified aftercare: Secondary | ICD-10-CM

## 2023-01-06 DIAGNOSIS — M25519 Pain in unspecified shoulder: Secondary | ICD-10-CM

## 2023-01-06 NOTE — Progress Notes (Signed)
HPI: The patient returns for scheduled wound check of the left sternoclavicular joint.  At the last visit a surveillance culture was obtained due to some mild tenderness around the joint.  This returned negative with only squamous skin cells seen with no bacteria and negative culture.  On exam there is a persistent small sinus tract approximately 10 mm and about the width of a Q-tip cotton tip.  I irrigated the site with Vashe and placed a PLAIN Nu Gauze 1 inch strip into the wound after was moistened with Vashe.  Patient will continue to use PLAIN Nu Gauze instead of the iodoform going forward.  I think that the wound is probably healed and with skin covering the soft tissue down into the sinus tract.  To make sure there is no evidence of further infection and to help guide therapy going forward we will obtain a final CT scan of the site.  Current Outpatient Medications  Medication Sig Dispense Refill   acetaminophen (TYLENOL) 500 MG tablet Take 1,000 mg by mouth every 6 (six) hours as needed for moderate pain.     amLODipine (NORVASC) 10 MG tablet Take 10 mg by mouth in the morning.     atorvastatin (LIPITOR) 80 MG tablet Take 1 tablet (80 mg total) by mouth daily. 30 tablet 3   carvedilol (COREG) 12.5 MG tablet TAKE 1 TABLET BY MOUTH TWICE DAILY 30 tablet 3   chlorpheniramine-HYDROcodone (TUSSIONEX) 10-8 MG/5ML Take 5 mLs by mouth every 12 (twelve) hours as needed for cough. 115 mL 0   doxycycline (VIBRA-TABS) 100 MG tablet Take 1 tablet (100 mg total) by mouth 2 (two) times daily. 14 tablet 0   furosemide (LASIX) 40 MG tablet Take 1 tablet (40 mg total) by mouth daily. 90 tablet 2   hydrALAZINE (APRESOLINE) 25 MG tablet Take 1 tablet (25 mg total) by mouth 3 (three) times daily. 90 tablet 1   insulin glargine (LANTUS) 100 UNIT/ML Solostar Pen Inject 70 Units into the skin at bedtime. Take 40 units twice daily     Multiple Vitamins-Minerals (PRESERVISION AREDS 2) CAPS Take 1 capsule by mouth 2  (two) times daily.     nitroGLYCERIN (NITROSTAT) 0.4 MG SL tablet Place 1 tablet (0.4 mg total) under the tongue every 5 (five) minutes x 3 doses as needed for chest pain (if no relief after 3rd dose, proceed to ED for evaluation or call 911). 25 tablet 3   NOVOLOG FLEXPEN 100 UNIT/ML FlexPen INJECT 25-31 UNITS INTO THE SKIN THREE TIMES DAILY BEFORE MEALS (Patient taking differently: Inject 50 Units into the skin in the morning, at noon, and at bedtime.) 30 mL 0   zinc gluconate 50 MG tablet Take 50 mg by mouth daily.     No current facility-administered medications for this visit.   Facility-Administered Medications Ordered in Other Visits  Medication Dose Route Frequency Provider Last Rate Last Admin   regadenoson (LEXISCAN) injection SOLN 0.4 mg  0.4 mg Intravenous Once Chilton Si, MD       technetium tetrofosmin (TC-MYOVIEW) injection 32.8 millicurie  32.8 millicurie Intravenous Once PRN Chilton Si, MD         Physical Exam: Blood pressure (!) 160/80, pulse 85, resp. rate 20, height 6\' 4"  (1.93 m), weight 230 lb (104.3 kg), SpO2 96%.  Alert and comfortable Has a dry cough today Lungs are clear Heart rate regular No edema No significant tenderness or redness around the left sternoclavicular site with a persistent 10 mm sinus tract below  the surface of the skin without drainage or purulence.  Diagnostic Tests: Wound culture negative from last week  Impression: Persistent sinus tract in the area of the left sternoclavicular joint which remains culture negative and has probably been reepithelialized with skin and will resolve over time.  Plan: Return in 1 week for wound check and with  a final CT scan of the chest to assess the soft tissue anatomy around the sternoclavicular joint on the left side.   Lovett Sox, MD [facility] [phone]

## 2023-01-09 DIAGNOSIS — E119 Type 2 diabetes mellitus without complications: Secondary | ICD-10-CM | POA: Diagnosis not present

## 2023-01-09 DIAGNOSIS — T8141XD Infection following a procedure, superficial incisional surgical site, subsequent encounter: Secondary | ICD-10-CM | POA: Diagnosis not present

## 2023-01-09 DIAGNOSIS — M50322 Other cervical disc degeneration at C5-C6 level: Secondary | ICD-10-CM | POA: Diagnosis not present

## 2023-01-09 DIAGNOSIS — L02213 Cutaneous abscess of chest wall: Secondary | ICD-10-CM | POA: Diagnosis not present

## 2023-01-09 DIAGNOSIS — M4802 Spinal stenosis, cervical region: Secondary | ICD-10-CM | POA: Diagnosis not present

## 2023-01-10 ENCOUNTER — Other Ambulatory Visit: Payer: Medicare Other

## 2023-01-13 ENCOUNTER — Other Ambulatory Visit: Payer: Self-pay | Admitting: Cardiothoracic Surgery

## 2023-01-13 ENCOUNTER — Ambulatory Visit: Payer: Medicare Other | Admitting: Cardiothoracic Surgery

## 2023-01-16 ENCOUNTER — Encounter: Payer: Self-pay | Admitting: Cardiothoracic Surgery

## 2023-01-16 ENCOUNTER — Ambulatory Visit: Payer: Medicare Other | Admitting: Cardiothoracic Surgery

## 2023-01-16 ENCOUNTER — Ambulatory Visit
Admission: RE | Admit: 2023-01-16 | Discharge: 2023-01-16 | Disposition: A | Discharge status: Home / Self Care | Payer: Medicare Other | Source: Ambulatory Visit | Attending: Cardiothoracic Surgery | Admitting: Cardiothoracic Surgery

## 2023-01-16 VITALS — BP 149/73 | HR 73 | Resp 20 | Ht 76.0 in | Wt 230.0 lb

## 2023-01-16 DIAGNOSIS — Z4801 Encounter for change or removal of surgical wound dressing: Secondary | ICD-10-CM | POA: Diagnosis not present

## 2023-01-16 DIAGNOSIS — T8142XA Infection following a procedure, deep incisional surgical site, initial encounter: Secondary | ICD-10-CM | POA: Diagnosis not present

## 2023-01-16 DIAGNOSIS — M009 Pyogenic arthritis, unspecified: Secondary | ICD-10-CM

## 2023-01-16 DIAGNOSIS — M25519 Pain in unspecified shoulder: Secondary | ICD-10-CM

## 2023-01-16 DIAGNOSIS — I700 Atherosclerosis of aorta: Secondary | ICD-10-CM | POA: Diagnosis not present

## 2023-01-16 MED ORDER — IOPAMIDOL (ISOVUE-300) INJECTION 61%
75.0000 mL | Freq: Once | INTRAVENOUS | Status: AC | PRN
  Administered 2023-01-16: 75 mL via INTRAVENOUS

## 2023-01-16 NOTE — Progress Notes (Addendum)
 HPI: The patient presents for scheduled office visit with CT scan of the chest performed earlier today.  He has been treated for MSSA left sternoclavicular joint abscess, including operative surgical debridement with wound vac therapy  x2,  and postoperative wound care with healing by secondary intent.  There is been persistent mucoid drainage from a residual sinus tract which goes approximately 10 to 12 mm deep.  Cultures are repeatedly negative.  His wife is trying to perform new gauze plain packing with Vashe wet-to-dry at home.  Because the sinus tract is so narrow this has been very difficult for her.  Patient had a CT scan of his chest today because of the persistent culture-negative drainage.  I personally reviewed the images and there is no evidence of pocket of fluid or infected material by my interpretation.  The space created by removing the head of the left clavicle and part of the right manubrium has been healing with normal.  Tissue.  Radiology report is pending.  Current Outpatient Medications  Medication Sig Dispense Refill   acetaminophen  (TYLENOL ) 500 MG tablet Take 1,000 mg by mouth every 6 (six) hours as needed for moderate pain.     amLODipine  (NORVASC ) 10 MG tablet Take 10 mg by mouth in the morning.     atorvastatin  (LIPITOR ) 80 MG tablet Take 1 tablet (80 mg total) by mouth daily. 30 tablet 3   carvedilol  (COREG ) 12.5 MG tablet TAKE 1 TABLET BY MOUTH TWICE DAILY 30 tablet 3   chlorpheniramine-HYDROcodone  (TUSSIONEX) 10-8 MG/5ML Take 5 mLs by mouth every 12 (twelve) hours as needed for cough. 115 mL 0   doxycycline  (VIBRA -TABS) 100 MG tablet Take 1 tablet (100 mg total) by mouth 2 (two) times daily. 14 tablet 0   furosemide  (LASIX ) 40 MG tablet Take 1 tablet (40 mg total) by mouth daily. 90 tablet 2   hydrALAZINE  (APRESOLINE ) 25 MG tablet TAKE 1 TABLET BY MOUTH THREE TIMES DAILY 90 tablet 1   insulin  glargine (LANTUS ) 100 UNIT/ML Solostar Pen Inject 70 Units into the skin at  bedtime. Take 40 units twice daily     Multiple Vitamins-Minerals (PRESERVISION AREDS 2) CAPS Take 1 capsule by mouth 2 (two) times daily.     nitroGLYCERIN  (NITROSTAT ) 0.4 MG SL tablet Place 1 tablet (0.4 mg total) under the tongue every 5 (five) minutes x 3 doses as needed for chest pain (if no relief after 3rd dose, proceed to ED for evaluation or call 911). 25 tablet 3   NOVOLOG  FLEXPEN 100 UNIT/ML FlexPen INJECT 25-31 UNITS INTO THE SKIN THREE TIMES DAILY BEFORE MEALS (Patient taking differently: Inject 50 Units into the skin in the morning, at noon, and at bedtime.) 30 mL 0   zinc gluconate 50 MG tablet Take 50 mg by mouth daily.     No current facility-administered medications for this visit.   Facility-Administered Medications Ordered in Other Visits  Medication Dose Route Frequency Provider Last Rate Last Admin   regadenoson  (LEXISCAN ) injection SOLN 0.4 mg  0.4 mg Intravenous Once Raford Riggs, MD       technetium tetrofosmin  (TC-MYOVIEW ) injection 32.8 millicurie  32.8 millicurie Intravenous Once PRN Raford Riggs, MD         Physical Exam: Blood pressure (!) 149/73, pulse 73, resp. rate 20, height 6' 4 (1.93 m), weight 230 lb (104.3 kg), SpO2 96%.  Patient is alert and comfortable Lungs are clear Heart rate regular No tenderness or fluctuance over the left sternoclavicular area.  The 3 mm  wide sinus tract extends approximately 10 mm deep and was cleaned with Vashe and packed with quarter inch Nu Gauze wet-to-dry. Diagnostic Tests: CT scan images personally reviewed showing no evidence of residual abscess or need for further I&D.  Impression: Slow to heal left sternoclavicular joint wound. 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 Return to clinic in 1 week.  Maude Ferguson, MD [facility] [phone]

## 2023-01-17 ENCOUNTER — Other Ambulatory Visit: Payer: Self-pay | Admitting: Cardiothoracic Surgery

## 2023-01-17 MED ORDER — CEPHALEXIN 500 MG PO CAPS: 500.0000 mg | ORAL_CAPSULE | Freq: Three times a day (TID) | ORAL | 0 refills | Status: DC

## 2023-01-17 NOTE — Telephone Encounter (Signed)
 CT surgery note  The report on the recent CT scan of the chest to assess the status of the patient's left sternoclavicular joint infection shows improvement without any residual fluid collection or abscess.  However there remains indurated tissue.  Because of the persistence of a narrow sinus tract draining to the skin another course of oral Keflex  will be started despite the presence of multiple negative cultures taken from the wound over the past several weeks.

## 2023-01-22 ENCOUNTER — Ambulatory Visit: Payer: Medicare Other | Admitting: Cardiothoracic Surgery

## 2023-01-22 DIAGNOSIS — M4802 Spinal stenosis, cervical region: Secondary | ICD-10-CM | POA: Diagnosis not present

## 2023-01-22 DIAGNOSIS — E119 Type 2 diabetes mellitus without complications: Secondary | ICD-10-CM | POA: Diagnosis not present

## 2023-01-22 DIAGNOSIS — T8141XD Infection following a procedure, superficial incisional surgical site, subsequent encounter: Secondary | ICD-10-CM | POA: Diagnosis not present

## 2023-01-22 DIAGNOSIS — M50322 Other cervical disc degeneration at C5-C6 level: Secondary | ICD-10-CM | POA: Diagnosis not present

## 2023-01-22 DIAGNOSIS — L02213 Cutaneous abscess of chest wall: Secondary | ICD-10-CM | POA: Diagnosis not present

## 2023-01-23 ENCOUNTER — Encounter: Payer: Self-pay | Admitting: Cardiothoracic Surgery

## 2023-01-23 ENCOUNTER — Ambulatory Visit (INDEPENDENT_AMBULATORY_CARE_PROVIDER_SITE_OTHER): Payer: Medicare Other | Admitting: Cardiothoracic Surgery

## 2023-01-23 VITALS — BP 160/73 | HR 76 | Resp 20 | Ht 76.0 in | Wt 230.0 lb

## 2023-01-23 DIAGNOSIS — Z4801 Encounter for change or removal of surgical wound dressing: Secondary | ICD-10-CM

## 2023-01-23 NOTE — Progress Notes (Signed)
HPI: Patient presents for scheduled wound care of the left sternoclavicular joint.  He is finishing course of oral antibiotics (Keflex ) his last CT scan of his neck showed significant healing without fluid collection or abscess but with some indurated tissue.  Multiple cultures have been negative.  He continues to pack the 1 cm deep narrow sinus tract with quarter inch Nu Gauze and Vashe wet-to-dry each day. He denies any discomfort in the area of the wound. States his blood sugars are well-controlled. Current Outpatient Medications  Medication Sig Dispense Refill   acetaminophen  (TYLENOL ) 500 MG tablet Take 1,000 mg by mouth every 6 (six) hours as needed for moderate pain.     amLODipine  (NORVASC ) 10 MG tablet Take 10 mg by mouth in the morning.     atorvastatin  (LIPITOR ) 80 MG tablet Take 1 tablet (80 mg total) by mouth daily. 30 tablet 3   carvedilol  (COREG ) 12.5 MG tablet TAKE 1 TABLET BY MOUTH TWICE DAILY 30 tablet 3   cephALEXin  (KEFLEX ) 500 MG capsule Take 1 capsule (500 mg total) by mouth 3 (three) times daily. 21 capsule 0   chlorpheniramine-HYDROcodone  (TUSSIONEX) 10-8 MG/5ML Take 5 mLs by mouth every 12 (twelve) hours as needed for cough. 115 mL 0   doxycycline  (VIBRA -TABS) 100 MG tablet Take 1 tablet (100 mg total) by mouth 2 (two) times daily. 14 tablet 0   furosemide  (LASIX ) 40 MG tablet Take 1 tablet (40 mg total) by mouth daily. 90 tablet 2   hydrALAZINE  (APRESOLINE ) 25 MG tablet TAKE 1 TABLET BY MOUTH THREE TIMES DAILY 90 tablet 1   insulin  glargine (LANTUS ) 100 UNIT/ML Solostar Pen Inject 70 Units into the skin at bedtime. Take 40 units twice daily     Multiple Vitamins-Minerals (PRESERVISION AREDS 2) CAPS Take 1 capsule by mouth 2 (two) times daily.     nitroGLYCERIN  (NITROSTAT ) 0.4 MG SL tablet Place 1 tablet (0.4 mg total) under the tongue every 5 (five) minutes x 3 doses as needed for chest pain (if no relief after 3rd dose, proceed to ED for evaluation or call 911). 25 tablet  3   NOVOLOG  FLEXPEN 100 UNIT/ML FlexPen INJECT 25-31 UNITS INTO THE SKIN THREE TIMES DAILY BEFORE MEALS (Patient taking differently: Inject 50 Units into the skin in the morning, at noon, and at bedtime.) 30 mL 0   zinc gluconate 50 MG tablet Take 50 mg by mouth daily.     No current facility-administered medications for this visit.   Facility-Administered Medications Ordered in Other Visits  Medication Dose Route Frequency Provider Last Rate Last Admin   regadenoson  (LEXISCAN ) injection SOLN 0.4 mg  0.4 mg Intravenous Once Raford Riggs, MD       technetium tetrofosmin  (TC-MYOVIEW ) injection 32.8 millicurie  32.8 millicurie Intravenous Once PRN Raford Riggs, MD         Physical Exam: Blood pressure (!) 160/73, pulse 76, resp. rate 20, height 6' 4 (1.93 m), weight 230 lb (104.3 kg), SpO2 98%.  Alert and comfortable Regular rhythm Wound without erythema or fluctuance 1 cm deep narrow sinus tract cleaned with Vashe soaked on a Q-tip and then packed with quarter inch Nu Gauze soaked in Vashe  Diagnostic Tests: None  Impression: Slow to heal persistent narrow sinus tract about 10 mm deep in the previous surgical debridement wound.  Plan: Continue with Vashe packing and finish current course of oral Keflex .  Return for wound check in 10 days.   Maude Ferguson, MD [facility] [phone])  [phone]       

## 2023-01-28 DIAGNOSIS — T8141XD Infection following a procedure, superficial incisional surgical site, subsequent encounter: Secondary | ICD-10-CM | POA: Diagnosis not present

## 2023-01-28 DIAGNOSIS — M50322 Other cervical disc degeneration at C5-C6 level: Secondary | ICD-10-CM | POA: Diagnosis not present

## 2023-01-28 DIAGNOSIS — L02213 Cutaneous abscess of chest wall: Secondary | ICD-10-CM | POA: Diagnosis not present

## 2023-01-28 DIAGNOSIS — M4802 Spinal stenosis, cervical region: Secondary | ICD-10-CM | POA: Diagnosis not present

## 2023-01-28 DIAGNOSIS — E119 Type 2 diabetes mellitus without complications: Secondary | ICD-10-CM | POA: Diagnosis not present

## 2023-02-03 ENCOUNTER — Encounter: Payer: Self-pay | Admitting: Cardiothoracic Surgery

## 2023-02-03 ENCOUNTER — Ambulatory Visit (INDEPENDENT_AMBULATORY_CARE_PROVIDER_SITE_OTHER): Payer: Medicare Other | Admitting: Cardiothoracic Surgery

## 2023-02-03 VITALS — BP 114/71 | HR 96 | Resp 18 | Ht 76.0 in | Wt 233.0 lb

## 2023-02-03 DIAGNOSIS — Z5189 Encounter for other specified aftercare: Secondary | ICD-10-CM | POA: Diagnosis not present

## 2023-02-03 NOTE — Progress Notes (Signed)
HPI: The patient presents for scheduled clinic visit for wound care of the left sternoclavicular joint status post surgical debridement x 2.  The abscess was due to an episode of MSSA bacteremia earlier in the year.     There remains a small sinus tract extending about 10 mm beneath the skin which is being packed with 1/4 inch Nu Gauze soaked in Vashe solution daily at home.  Cultures of the wound taken over the past few months have been consistently negative.  CT scan performed earlier this month shows resolution of the abscess and no evidence of osteomyelitis.  The patient recently finished a follow-up force of empiric Keflex.  Skin is closing over the sinus tract making it difficult for the family to pack the wound.  Today I stretched open the sinus tract, applied silver nitrate followed by Vashe wet-to-dry packing with 1/4 inch gauze.  Current Outpatient Medications  Medication Sig Dispense Refill   acetaminophen (TYLENOL) 500 MG tablet Take 1,000 mg by mouth every 6 (six) hours as needed for moderate pain.     amLODipine (NORVASC) 10 MG tablet Take 10 mg by mouth in the morning.     atorvastatin (LIPITOR) 80 MG tablet Take 1 tablet (80 mg total) by mouth daily. 30 tablet 3   carvedilol (COREG) 12.5 MG tablet TAKE 1 TABLET BY MOUTH TWICE DAILY 30 tablet 3   cephALEXin (KEFLEX) 500 MG capsule Take 1 capsule (500 mg total) by mouth 3 (three) times daily. 21 capsule 0   chlorpheniramine-HYDROcodone (TUSSIONEX) 10-8 MG/5ML Take 5 mLs by mouth every 12 (twelve) hours as needed for cough. 115 mL 0   doxycycline (VIBRA-TABS) 100 MG tablet Take 1 tablet (100 mg total) by mouth 2 (two) times daily. 14 tablet 0   furosemide (LASIX) 40 MG tablet Take 1 tablet (40 mg total) by mouth daily. 90 tablet 2   hydrALAZINE (APRESOLINE) 25 MG tablet TAKE 1 TABLET BY MOUTH THREE TIMES DAILY 90 tablet 1   insulin glargine (LANTUS) 100 UNIT/ML Solostar Pen Inject 70 Units into the skin at bedtime. Take 40 units  twice daily     Multiple Vitamins-Minerals (PRESERVISION AREDS 2) CAPS Take 1 capsule by mouth 2 (two) times daily.     nitroGLYCERIN (NITROSTAT) 0.4 MG SL tablet Place 1 tablet (0.4 mg total) under the tongue every 5 (five) minutes x 3 doses as needed for chest pain (if no relief after 3rd dose, proceed to ED for evaluation or call 911). 25 tablet 3   NOVOLOG FLEXPEN 100 UNIT/ML FlexPen INJECT 25-31 UNITS INTO THE SKIN THREE TIMES DAILY BEFORE MEALS (Patient taking differently: Inject 50 Units into the skin in the morning, at noon, and at bedtime.) 30 mL 0   zinc gluconate 50 MG tablet Take 50 mg by mouth daily.     No current facility-administered medications for this visit.   Facility-Administered Medications Ordered in Other Visits  Medication Dose Route Frequency Provider Last Rate Last Admin   regadenoson (LEXISCAN) injection SOLN 0.4 mg  0.4 mg Intravenous Once Chilton Si, MD       technetium tetrofosmin (TC-MYOVIEW) injection 32.8 millicurie  32.8 millicurie Intravenous Once PRN Chilton Si, MD         Physical Exam: Blood pressure 114/71, pulse 96, resp. rate 18, height 6\' 4"  (1.93 m), weight 233 lb (105.7 kg), SpO2 97%.   Alert and comfortable, no pain or tenderness surrounding the sinus tract.  Wound was treated and packed as described above. Lungs clear  Heart rate regular Minimal pedal edema  Diagnostic Tests: None  Impression: Left sternoclavicular joint abscess debrided x 2 now healing by secondary intent with daily packing at home with quarter inch Nu Gauze wet-to-dry with Vashe.  Plan: Continue current wound care with clinic follow-ups every 1 to 2 weeks.  Wound has been difficult to totally heal due to diabetes and tendency for the skin to close over the top of the sinus tract.   Lovett Sox, MD [facility] [phone]

## 2023-02-10 ENCOUNTER — Ambulatory Visit (INDEPENDENT_AMBULATORY_CARE_PROVIDER_SITE_OTHER): Payer: Medicare Other | Admitting: Surgical

## 2023-02-10 VITALS — BP 144/66 | HR 70 | Resp 18 | Wt 235.0 lb

## 2023-02-10 DIAGNOSIS — Z5189 Encounter for other specified aftercare: Secondary | ICD-10-CM

## 2023-02-10 NOTE — Patient Instructions (Signed)
Continue same dressing changes

## 2023-02-10 NOTE — Progress Notes (Signed)
[address]       [place] [address]             [phone]      KOLSON CHOVANEC [facility] Medical Record #[id number] Date of Birth: November 04, 1945  Referring: Kirstie Peri, MD Primary Care: Kirstie Peri, MD Primary Cardiologist: Nona Dell, MD   Chief Complaint:   POST OP FOLLOW UP   Operative Report    DATE OF PROCEDURE: 09/26/2022   OPERATION:   1.  Excisional debridement of left sternoclavicular joint wound-infection. 2.  Wound irrigation with Vashe solution. 3.  Application of wound matrix material Myriad Morcells 500 mg.   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   ANESTHESIA:  General.   History of Present Illness:    Patient is again seen in the office in follow-up for his wound.  He is status post surgical debridement x 2 of the sternoclavicular joint abscess and has been receiving local wound care with packing for several weeks.  Overall he is feeling no discomfort and has no specific new complaints.  He denies fevers, chills or other institutional symptoms.      Past Medical History:  Diagnosis Date   Ascending aortic aneurysm (HCC)    4.1 cm in 2019   Chronic back pain    Coronary artery disease    Status post CABG 2014 (LIMA to LAD, SVG to ramus, SVG to OM 2)   Dermatophytosis of the body    Eczema    Essential hypertension    GERD (gastroesophageal reflux disease)    Herpes zoster without mention of complication    History of bronchitis    History of claustrophobia    HOH (hard of hearing)    Mixed hyperlipidemia    Prostate cancer (HCC)    Psoriasis and similar disorder    Sinusitis    Type 2 diabetes mellitus (HCC)      Social History   Tobacco Use  Smoking Status Former   Current packs/day: 0.00   Average packs/day: 1 pack/day for 4.0  years (4.0 ttl pk-yrs)   Types: Cigarettes   Start date: 03/04/1965   Quit date: 03/05/1967   Years since quitting: 55.9   Passive exposure: Past  Smokeless Tobacco Never    Social History   Substance and Sexual Activity  Alcohol Use No   Alcohol/week: 0.0 standard drinks of alcohol     No Known Allergies  Current Outpatient Medications  Medication Sig Dispense Refill   acetaminophen (TYLENOL) 500 MG tablet Take 1,000 mg by mouth every 6 (six) hours as needed for moderate pain.     amLODipine (NORVASC) 10 MG tablet Take 10 mg by mouth in the morning.     atorvastatin (LIPITOR) 80 MG tablet Take 1 tablet (80 mg total) by mouth daily. 30 tablet 3   carvedilol (COREG) 12.5 MG tablet TAKE 1 TABLET BY MOUTH TWICE DAILY 30 tablet 3   cephALEXin (KEFLEX) 500 MG capsule Take 1 capsule (500 mg total) by mouth 3 (three) times daily. 21 capsule 0   chlorpheniramine-HYDROcodone (TUSSIONEX) 10-8 MG/5ML Take 5 mLs by mouth every 12 (twelve) hours as needed for cough. 115 mL 0   doxycycline (VIBRA-TABS) 100 MG tablet Take 1 tablet (100 mg total) by mouth 2 (two) times daily. 14 tablet  0   furosemide (LASIX) 40 MG tablet Take 1 tablet (40 mg total) by mouth daily. 90 tablet 2   hydrALAZINE (APRESOLINE) 25 MG tablet TAKE 1 TABLET BY MOUTH THREE TIMES DAILY 90 tablet 1   insulin glargine (LANTUS) 100 UNIT/ML Solostar Pen Inject 70 Units into the skin at bedtime. Take 40 units twice daily     Multiple Vitamins-Minerals (PRESERVISION AREDS 2) CAPS Take 1 capsule by mouth 2 (two) times daily.     nitroGLYCERIN (NITROSTAT) 0.4 MG SL tablet Place 1 tablet (0.4 mg total) under the tongue every 5 (five) minutes x 3 doses as needed for chest pain (if no relief after 3rd dose, proceed to ED for evaluation or call 911). 25 tablet 3   NOVOLOG FLEXPEN 100 UNIT/ML FlexPen INJECT 25-31 UNITS INTO THE SKIN THREE TIMES DAILY BEFORE MEALS (Patient taking differently: Inject 50 Units into the skin in the morning, at noon,  and at bedtime.) 30 mL 0   zinc gluconate 50 MG tablet Take 50 mg by mouth daily.     No current facility-administered medications for this visit.   Facility-Administered Medications Ordered in Other Visits  Medication Dose Route Frequency Provider Last Rate Last Admin   regadenoson (LEXISCAN) injection SOLN 0.4 mg  0.4 mg Intravenous Once Chilton Si, MD       technetium tetrofosmin (TC-MYOVIEW) injection 32.8 millicurie  32.8 millicurie Intravenous Once PRN Chilton Si, MD           Physical Exam: BP (!) 144/66   Pulse 70   Resp 18   Wt 235 lb (106.6 kg)   SpO2 95% Comment: RA  BMI 28.61 kg/m   The wound was packed again with Vashi and quarter inch Nu Gauze after removing old dressing.  I probed it and there is a small tract that is difficult to quantify but appears to be adequately addressed by the current dressing strategy.  There is no evidence of cellulitis and no purulence.   Diagnostic Studies & Laboratory data:     Recent Radiology Findings:   No results found.    Recent Lab Findings: Lab Results  Component Value Date   WBC 5.5 10/25/2022   HGB 10.6 (L) 10/25/2022   HCT 34.8 (L) 10/25/2022   PLT 217 10/25/2022   GLUCOSE 275 (H) 10/25/2022   CHOL 144 06/06/2022   TRIG 285 (H) 06/06/2022   HDL 37 (L) 06/06/2022   LDLCALC 50 06/06/2022   ALT 12 09/26/2022   AST 17 09/26/2022   NA 138 10/25/2022   K 4.8 10/25/2022   CL 102 10/25/2022   CREATININE 0.75 10/25/2022   BUN 13 10/25/2022   CO2 29 10/25/2022   TSH 2.92 04/23/2017   INR 1.1 07/22/2022   HGBA1C 8.6 (H) 06/06/2022      Assessment / Plan: Continue current dressing changes daily and we will see again in 2 weeks.  Hopefully by that time the wound will no longer require packing.      Medication Changes: No orders of the defined types were placed in this encounter.     Rowe Clack, PA-C  02/10/2023 12:24 PM

## 2023-02-11 DIAGNOSIS — M50322 Other cervical disc degeneration at C5-C6 level: Secondary | ICD-10-CM | POA: Diagnosis not present

## 2023-02-11 DIAGNOSIS — M4802 Spinal stenosis, cervical region: Secondary | ICD-10-CM | POA: Diagnosis not present

## 2023-02-11 DIAGNOSIS — T8141XD Infection following a procedure, superficial incisional surgical site, subsequent encounter: Secondary | ICD-10-CM | POA: Diagnosis not present

## 2023-02-11 DIAGNOSIS — L02213 Cutaneous abscess of chest wall: Secondary | ICD-10-CM | POA: Diagnosis not present

## 2023-02-11 DIAGNOSIS — E119 Type 2 diabetes mellitus without complications: Secondary | ICD-10-CM | POA: Diagnosis not present

## 2023-02-20 ENCOUNTER — Other Ambulatory Visit: Payer: Self-pay | Admitting: Cardiology

## 2023-02-21 DIAGNOSIS — M4802 Spinal stenosis, cervical region: Secondary | ICD-10-CM | POA: Diagnosis not present

## 2023-02-21 DIAGNOSIS — E119 Type 2 diabetes mellitus without complications: Secondary | ICD-10-CM | POA: Diagnosis not present

## 2023-02-21 DIAGNOSIS — I110 Hypertensive heart disease with heart failure: Secondary | ICD-10-CM | POA: Diagnosis not present

## 2023-02-21 DIAGNOSIS — L02213 Cutaneous abscess of chest wall: Secondary | ICD-10-CM | POA: Diagnosis not present

## 2023-02-24 DIAGNOSIS — E1129 Type 2 diabetes mellitus with other diabetic kidney complication: Secondary | ICD-10-CM | POA: Diagnosis not present

## 2023-02-26 ENCOUNTER — Ambulatory Visit: Payer: Medicare Other | Admitting: Physician Assistant

## 2023-02-26 VITALS — BP 165/82 | HR 68 | Resp 18 | Ht 76.0 in | Wt 237.0 lb

## 2023-02-26 DIAGNOSIS — Z5189 Encounter for other specified aftercare: Secondary | ICD-10-CM

## 2023-02-26 DIAGNOSIS — T8141XD Infection following a procedure, superficial incisional surgical site, subsequent encounter: Secondary | ICD-10-CM | POA: Diagnosis not present

## 2023-02-26 DIAGNOSIS — E119 Type 2 diabetes mellitus without complications: Secondary | ICD-10-CM | POA: Diagnosis not present

## 2023-02-26 DIAGNOSIS — L02213 Cutaneous abscess of chest wall: Secondary | ICD-10-CM | POA: Diagnosis not present

## 2023-02-26 DIAGNOSIS — M50322 Other cervical disc degeneration at C5-C6 level: Secondary | ICD-10-CM | POA: Diagnosis not present

## 2023-02-26 DIAGNOSIS — M4802 Spinal stenosis, cervical region: Secondary | ICD-10-CM | POA: Diagnosis not present

## 2023-02-26 NOTE — Progress Notes (Signed)
 Postop follow-up for wound check:    DATE OF PROCEDURE: 09/26/2022   OPERATION:   1.  Excisional debridement of left sternoclavicular joint wound-infection. 2.  Wound irrigation with Vashe solution. 3.  Application of wound matrix material Myriad Morcells 500 mg.   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   ANESTHESIA:  General.  History of Present Illness: Mr. George [name] underwent surgical debridement x 2 of a left sternoclavicular joint abscess last year (July and September).  He has had home health nursing assisting him with dressing changes using Vashe solution. George [name] says he feels well.  He has had no pain or fever.  He is very encouraged that the wound is nearly completely closed.  Current Outpatient Medications  Medication Sig Dispense Refill   acetaminophen (TYLENOL) 500 MG tablet Take 1,000 mg by mouth every 6 (six) hours as needed for moderate pain.     amLODipine (NORVASC) 10 MG tablet Take 10 mg by mouth in the morning.     atorvastatin (LIPITOR) 80 MG tablet Take 1 tablet (80 mg total) by mouth daily. 30 tablet 3   carvedilol (COREG) 12.5 MG tablet TAKE 1 TABLET BY MOUTH TWICE DAILY 30 tablet 3   furosemide (LASIX) 40 MG tablet Take 1 tablet (40 mg total) by mouth daily. 90 tablet 2   hydrALAZINE (APRESOLINE) 25 MG tablet TAKE 1 TABLET BY MOUTH THREE TIMES DAILY 90 tablet 1   insulin glargine (LANTUS) 100 UNIT/ML Solostar Pen Inject 70 Units into the skin at bedtime. Take 40 units twice daily     Multiple Vitamins-Minerals (PRESERVISION AREDS 2) CAPS Take 1 capsule by mouth 2 (two) times daily.     nitroGLYCERIN (NITROSTAT) 0.4 MG SL tablet Place 1 tablet (0.4 mg total) under the tongue every 5 (five) minutes x 3 doses as needed for chest pain (if no relief after 3rd dose, proceed to ED for evaluation  or call 911). 25 tablet 3   NOVOLOG FLEXPEN 100 UNIT/ML FlexPen INJECT 25-31 UNITS INTO THE SKIN THREE TIMES DAILY BEFORE MEALS (Patient taking differently: Inject 50 Units into the skin in the morning, at noon, and at bedtime.) 30 mL 0   zinc gluconate 50 MG tablet Take 50 mg by mouth daily.     No current facility-administered medications for this visit.   Facility-Administered Medications Ordered in Other Visits  Medication Dose Route Frequency Provider Last Rate Last Admin   regadenoson (LEXISCAN) injection SOLN 0.4 mg  0.4 mg Intravenous Once Chilton Si, MD       technetium tetrofosmin (TC-MYOVIEW) injection 32.8 millicurie  32.8 millicurie Intravenous Once PRN Chilton Si, MD        Physical Exam  Vital signs BP 165/82 Heart rate 68 Respirations 18 SpO2 98% on room air  General: George [name] is in no distress and is in good spirits today. Wound: Wound at the top of the sternum is covered with a moist 2 x 2 gauze and is sealed on all sides with an OpSite plastic covering.  There was some clear watery drainage that had collected on the gauze but I cannot this is coming from the wound or just perspiration that had collected under the plastic covering.  I gently probed the wound with a sterile applicator and it measured about 3-4 cm in depth.  There was no tracking.  The wound was redressed with a sterile gauze and Mepilex covering.    Diagnostic Tests: None today   Impression / Plan: Continued healing of the [place] joint abscess the wound measures about 2 mm in diameter about 3 to 4 mm deep.  There was some moisture at the wound base when the dressing was removed today but I am not certain if this is coming from deep sinus tract or was just perspiration and had collected under the plastic covering.  There is no evidence of inflammation and no tenderness.  I asked him to leave the dressing off over the next several days and see if the wound will completely dry up and we will plan  to follow-up with him here in the office in about 2 weeks.  I told him we would plan to see him earlier if there is any regression like increased drainage, purulence, or erythema.   Leary Roca, PA-C [facility] [phone]

## 2023-02-26 NOTE — Progress Notes (Signed)
 George [name]

## 2023-02-26 NOTE — Patient Instructions (Signed)
Wash over the wound with soap and water daily.  Leave the wound open to air.   You may resume dressing changes if drainage, redness, or pain develop.  Follow-up in the office in 2 weeks

## 2023-03-13 ENCOUNTER — Ambulatory Visit (INDEPENDENT_AMBULATORY_CARE_PROVIDER_SITE_OTHER): Payer: Medicare Other | Admitting: Surgical

## 2023-03-13 VITALS — BP 180/81 | HR 84 | Resp 20 | Wt 235.4 lb

## 2023-03-13 DIAGNOSIS — Z5189 Encounter for other specified aftercare: Secondary | ICD-10-CM

## 2023-03-13 NOTE — Progress Notes (Signed)
 [address]       [place] [address]             [phone]      DANIE DIEHL [facility] Medical Record #[id number] Date of Birth: Jun 09, 1945  Referring: Kirstie Peri, MD Primary Care: Kirstie Peri, MD Primary Cardiologist: Nona Dell, MD   Chief Complaint:   POST OP FOLLOW UP Operative Report    DATE OF PROCEDURE: 09/26/2022   OPERATION:   1.  Excisional debridement of left sternoclavicular joint wound-infection. 2.  Wound irrigation with Vashe solution. 3.  Application of wound matrix material Myriad Morcells 500 mg.   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   ANESTHESIA:  General. History of Present Illness:    To be seen for his wound and it continues to feel better.  He denies any kind of pain.  There continues to be small amount of drainage.  He denies fevers, chills or other significant constitutional symptoms.  It no longer appears to be deep enough to warrant ongoing wound packing.      Past Medical History:  Diagnosis Date   Ascending aortic aneurysm (HCC)    4.1 cm in 2019   Chronic back pain    Coronary artery disease    Status post CABG 2014 (LIMA to LAD, SVG to ramus, SVG to OM 2)   Dermatophytosis of the body    Eczema    Essential hypertension    GERD (gastroesophageal reflux disease)    Herpes zoster without mention of complication    History of bronchitis    History of claustrophobia    HOH (hard of hearing)    Mixed hyperlipidemia    Prostate cancer (HCC)    Psoriasis and similar disorder    Sinusitis    Type 2 diabetes mellitus (HCC)      Social History   Tobacco Use  Smoking Status Former   Current packs/day: 0.00   Average packs/day: 1 pack/day for 4.0 years (4.0 ttl pk-yrs)   Types: Cigarettes   Start date: 03/04/1965   Quit  date: 03/05/1967   Years since quitting: 56.0   Passive exposure: Past  Smokeless Tobacco Never    Social History   Substance and Sexual Activity  Alcohol Use No   Alcohol/week: 0.0 standard drinks of alcohol     No Known Allergies  Current Outpatient Medications  Medication Sig Dispense Refill   acetaminophen (TYLENOL) 500 MG tablet Take 1,000 mg by mouth every 6 (six) hours as needed for moderate pain.     amLODipine (NORVASC) 10 MG tablet Take 10 mg by mouth in the morning.     atorvastatin (LIPITOR) 80 MG tablet Take 1 tablet (80 mg total) by mouth daily. 30 tablet 3   carvedilol (COREG) 12.5 MG tablet TAKE 1 TABLET BY MOUTH TWICE DAILY 30 tablet 3   furosemide (LASIX) 40 MG tablet Take 1 tablet (40 mg total) by mouth daily. 90 tablet 2   hydrALAZINE (APRESOLINE) 25 MG tablet TAKE 1 TABLET BY MOUTH THREE TIMES DAILY 90 tablet 1   insulin glargine (LANTUS) 100 UNIT/ML Solostar Pen Inject 70 Units into the skin at bedtime. Take 40 units twice daily     Multiple Vitamins-Minerals (PRESERVISION AREDS 2) CAPS Take 1 capsule by mouth 2 (two) times  daily.     nitroGLYCERIN (NITROSTAT) 0.4 MG SL tablet Place 1 tablet (0.4 mg total) under the tongue every 5 (five) minutes x 3 doses as needed for chest pain (if no relief after 3rd dose, proceed to ED for evaluation or call 911). 25 tablet 3   NOVOLOG FLEXPEN 100 UNIT/ML FlexPen INJECT 25-31 UNITS INTO THE SKIN THREE TIMES DAILY BEFORE MEALS (Patient taking differently: Inject 50 Units into the skin in the morning, at noon, and at bedtime.) 30 mL 0   zinc gluconate 50 MG tablet Take 50 mg by mouth daily.     No current facility-administered medications for this visit.   Facility-Administered Medications Ordered in Other Visits  Medication Dose Route Frequency Provider Last Rate Last Admin   regadenoson (LEXISCAN) injection SOLN 0.4 mg  0.4 mg Intravenous Once Chilton Si, MD       technetium tetrofosmin (TC-MYOVIEW) injection 32.8  millicurie  32.8 millicurie Intravenous Once PRN Chilton Si, MD           Physical Exam: BP (!) 180/81 (BP Location: Left Arm, Patient Position: Sitting, Cuff Size: Large)   Pulse 84   Resp 20   Wt 235 lb 6.4 oz (106.8 kg)   SpO2 96% Comment: RA  BMI 28.65 kg/m   Wound:   appears stable.  There is no surrounding cellulitis.  It is not tender to touch.  It is less than 1/2 cm depth and does not track deeper upon probing with sterile Q-tip.  There is a small amount of drainage.  It is slightly purulent.   Diagnostic Studies & Laboratory data:     Recent Radiology Findings:   No results found.    Recent Lab Findings: Lab Results  Component Value Date   WBC 5.5 10/25/2022   HGB 10.6 (L) 10/25/2022   HCT 34.8 (L) 10/25/2022   PLT 217 10/25/2022   GLUCOSE 275 (H) 10/25/2022   CHOL 144 06/06/2022   TRIG 285 (H) 06/06/2022   HDL 37 (L) 06/06/2022   LDLCALC 50 06/06/2022   ALT 12 09/26/2022   AST 17 09/26/2022   NA 138 10/25/2022   K 4.8 10/25/2022   CL 102 10/25/2022   CREATININE 0.75 10/25/2022   BUN 13 10/25/2022   CO2 29 10/25/2022   TSH 2.92 04/23/2017   INR 1.1 07/22/2022   HGBA1C 8.6 (H) 06/06/2022      Assessment / Plan: The wound appears stable.  I instructed him to keep it covered with a dry sterile dressing to protect clothing but there is nothing that would require packing at this point.  I instructed him on things to look for in terms of worsening including fevers, chills or changes in the wound, in particular findings that would be consistent with cellulitis.  I do not believe there would be any benefit currently for antibiotics.  We will see the patient again on a as needed basis as this will require further time for healing but unless worsening findings he does not necessarily need to keep coming to the office.  I did instruct him that if he has any concerns or feels like it needs to be seen not to hesitate to call our office and we will see.       Medication Changes: No orders of the defined types were placed in this encounter.      03/13/2023 10:53 AM

## 2023-03-13 NOTE — Patient Instructions (Signed)
 Keep wound clean and covered with dry dressing.

## 2023-03-24 ENCOUNTER — Other Ambulatory Visit (HOSPITAL_COMMUNITY): Payer: Self-pay

## 2023-04-03 DIAGNOSIS — I10 Essential (primary) hypertension: Secondary | ICD-10-CM | POA: Diagnosis not present

## 2023-04-03 DIAGNOSIS — Z0000 Encounter for general adult medical examination without abnormal findings: Secondary | ICD-10-CM | POA: Diagnosis not present

## 2023-04-03 DIAGNOSIS — Z7189 Other specified counseling: Secondary | ICD-10-CM | POA: Diagnosis not present

## 2023-04-03 DIAGNOSIS — E7800 Pure hypercholesterolemia, unspecified: Secondary | ICD-10-CM | POA: Diagnosis not present

## 2023-04-03 DIAGNOSIS — R5383 Other fatigue: Secondary | ICD-10-CM | POA: Diagnosis not present

## 2023-04-03 DIAGNOSIS — Z299 Encounter for prophylactic measures, unspecified: Secondary | ICD-10-CM | POA: Diagnosis not present

## 2023-04-03 DIAGNOSIS — H353211 Exudative age-related macular degeneration, right eye, with active choroidal neovascularization: Secondary | ICD-10-CM | POA: Diagnosis not present

## 2023-04-08 DIAGNOSIS — E7800 Pure hypercholesterolemia, unspecified: Secondary | ICD-10-CM | POA: Diagnosis not present

## 2023-04-08 DIAGNOSIS — Z79899 Other long term (current) drug therapy: Secondary | ICD-10-CM | POA: Diagnosis not present

## 2023-04-08 DIAGNOSIS — R5383 Other fatigue: Secondary | ICD-10-CM | POA: Diagnosis not present

## 2023-05-02 ENCOUNTER — Other Ambulatory Visit: Payer: Self-pay | Admitting: Cardiology

## 2023-05-16 DIAGNOSIS — H353211 Exudative age-related macular degeneration, right eye, with active choroidal neovascularization: Secondary | ICD-10-CM | POA: Diagnosis not present

## 2023-05-16 DIAGNOSIS — E1169 Type 2 diabetes mellitus with other specified complication: Secondary | ICD-10-CM | POA: Diagnosis not present

## 2023-05-16 DIAGNOSIS — B354 Tinea corporis: Secondary | ICD-10-CM | POA: Diagnosis not present

## 2023-05-16 DIAGNOSIS — Z299 Encounter for prophylactic measures, unspecified: Secondary | ICD-10-CM | POA: Diagnosis not present

## 2023-05-16 DIAGNOSIS — I509 Heart failure, unspecified: Secondary | ICD-10-CM | POA: Diagnosis not present

## 2023-05-16 DIAGNOSIS — L0390 Cellulitis, unspecified: Secondary | ICD-10-CM | POA: Diagnosis not present

## 2023-05-27 DIAGNOSIS — E1129 Type 2 diabetes mellitus with other diabetic kidney complication: Secondary | ICD-10-CM | POA: Diagnosis not present

## 2023-05-28 ENCOUNTER — Telehealth: Payer: Self-pay

## 2023-05-28 DIAGNOSIS — Z79899 Other long term (current) drug therapy: Secondary | ICD-10-CM

## 2023-05-28 NOTE — Progress Notes (Signed)
   05/28/2023  Patient ID: George [name], male   DOB: Jun 22, 1945, 78 y.o.   MRN: [id number]   MAC, [facility] reports   Atorvastatin  80 mg daily and lisinopril  10 mg last sold on 12/24/2022 no refills  (atorvastatin ) for 30 - days supply, per Dr. Anson Basta. Per chart review, no prescription renewals in EMR for atorvastatin . However, per Dr. Anson Basta lisinopril  last filled on 12/24/2022 for 15 days supply. Cardiologist discontinued lisinopril  d/t persistent dry cough. Additionally, PCP has two active atorvastatin  doses on file such as atorvastatin  20 mg and atorvastatin  80 mg. Per chart review, I do not see a prescription of ACE-I or ARB that is active.   Will follow up with PCP to clarify whether patient is still suppose to be on atorvastatin  and the desired dose.    Alexandria Angel, PharmD Clinical Pharmacist Cell: [phone]

## 2023-06-14 DIAGNOSIS — E119 Type 2 diabetes mellitus without complications: Secondary | ICD-10-CM | POA: Diagnosis not present

## 2023-06-14 DIAGNOSIS — R42 Dizziness and giddiness: Secondary | ICD-10-CM | POA: Diagnosis not present

## 2023-06-14 DIAGNOSIS — E785 Hyperlipidemia, unspecified: Secondary | ICD-10-CM | POA: Diagnosis not present

## 2023-06-14 DIAGNOSIS — R58 Hemorrhage, not elsewhere classified: Secondary | ICD-10-CM | POA: Diagnosis not present

## 2023-06-14 DIAGNOSIS — Z79899 Other long term (current) drug therapy: Secondary | ICD-10-CM | POA: Diagnosis not present

## 2023-06-14 DIAGNOSIS — R110 Nausea: Secondary | ICD-10-CM | POA: Diagnosis not present

## 2023-06-14 DIAGNOSIS — R404 Transient alteration of awareness: Secondary | ICD-10-CM | POA: Diagnosis not present

## 2023-06-14 DIAGNOSIS — R040 Epistaxis: Secondary | ICD-10-CM | POA: Diagnosis not present

## 2023-06-14 DIAGNOSIS — I10 Essential (primary) hypertension: Secondary | ICD-10-CM | POA: Diagnosis not present

## 2023-06-14 DIAGNOSIS — R6889 Other general symptoms and signs: Secondary | ICD-10-CM | POA: Diagnosis not present

## 2023-06-15 ENCOUNTER — Other Ambulatory Visit: Payer: Self-pay

## 2023-06-15 ENCOUNTER — Observation Stay (HOSPITAL_COMMUNITY)
Admission: EM | Admit: 2023-06-15 | Discharge: 2023-06-16 | Disposition: A | Discharge status: Home / Self Care | Attending: Internal Medicine | Admitting: Internal Medicine

## 2023-06-15 ENCOUNTER — Emergency Department (HOSPITAL_COMMUNITY)

## 2023-06-15 ENCOUNTER — Observation Stay (HOSPITAL_BASED_OUTPATIENT_CLINIC_OR_DEPARTMENT_OTHER)

## 2023-06-15 DIAGNOSIS — I110 Hypertensive heart disease with heart failure: Secondary | ICD-10-CM | POA: Diagnosis not present

## 2023-06-15 DIAGNOSIS — E1165 Type 2 diabetes mellitus with hyperglycemia: Secondary | ICD-10-CM | POA: Diagnosis not present

## 2023-06-15 DIAGNOSIS — I5022 Chronic systolic (congestive) heart failure: Secondary | ICD-10-CM | POA: Diagnosis not present

## 2023-06-15 DIAGNOSIS — I4891 Unspecified atrial fibrillation: Secondary | ICD-10-CM

## 2023-06-15 DIAGNOSIS — E782 Mixed hyperlipidemia: Secondary | ICD-10-CM | POA: Diagnosis not present

## 2023-06-15 DIAGNOSIS — Z87891 Personal history of nicotine dependence: Secondary | ICD-10-CM | POA: Insufficient documentation

## 2023-06-15 DIAGNOSIS — I480 Paroxysmal atrial fibrillation: Secondary | ICD-10-CM | POA: Diagnosis not present

## 2023-06-15 DIAGNOSIS — Z79899 Other long term (current) drug therapy: Secondary | ICD-10-CM | POA: Diagnosis not present

## 2023-06-15 DIAGNOSIS — Z794 Long term (current) use of insulin: Secondary | ICD-10-CM | POA: Diagnosis not present

## 2023-06-15 DIAGNOSIS — R270 Ataxia, unspecified: Principal | ICD-10-CM

## 2023-06-15 DIAGNOSIS — I2510 Atherosclerotic heart disease of native coronary artery without angina pectoris: Secondary | ICD-10-CM | POA: Diagnosis not present

## 2023-06-15 DIAGNOSIS — Z7982 Long term (current) use of aspirin: Secondary | ICD-10-CM | POA: Insufficient documentation

## 2023-06-15 DIAGNOSIS — R42 Dizziness and giddiness: Secondary | ICD-10-CM | POA: Diagnosis not present

## 2023-06-15 DIAGNOSIS — I10 Essential (primary) hypertension: Secondary | ICD-10-CM

## 2023-06-15 DIAGNOSIS — R1310 Dysphagia, unspecified: Secondary | ICD-10-CM

## 2023-06-15 DIAGNOSIS — R2990 Unspecified symptoms and signs involving the nervous system: Secondary | ICD-10-CM | POA: Diagnosis not present

## 2023-06-15 DIAGNOSIS — R29818 Other symptoms and signs involving the nervous system: Secondary | ICD-10-CM | POA: Diagnosis not present

## 2023-06-15 DIAGNOSIS — I6509 Occlusion and stenosis of unspecified vertebral artery: Secondary | ICD-10-CM | POA: Diagnosis not present

## 2023-06-15 DIAGNOSIS — R110 Nausea: Secondary | ICD-10-CM | POA: Diagnosis not present

## 2023-06-15 LAB — ECHOCARDIOGRAM COMPLETE
AR max vel: 3.54 cm2
AV Area VTI: 3.73 cm2
AV Area mean vel: 3.08 cm2
AV Mean grad: 2.9 mmHg
AV Peak grad: 4.8 mmHg
Ao pk vel: 1.1 m/s
Area-P 1/2: 3.5 cm2
Height: 75 in
S' Lateral: 3.7 cm
Weight: 3520 [oz_av]

## 2023-06-15 LAB — BASIC METABOLIC PANEL WITH GFR
Anion gap: 11 (ref 5–15)
BUN: 23 mg/dL (ref 8–23)
CO2: 28 mmol/L (ref 22–32)
Calcium: 9.1 mg/dL (ref 8.9–10.3)
Chloride: 97 mmol/L — ABNORMAL LOW (ref 98–111)
Creatinine, Ser: 0.88 mg/dL (ref 0.61–1.24)
GFR, Estimated: >60 mL/min (ref >60)
Glucose, Bld: 150 mg/dL — ABNORMAL HIGH (ref 70–99)
Potassium: 3.6 mmol/L (ref 3.5–5.1)
Sodium: 136 mmol/L (ref 135–145)

## 2023-06-15 LAB — URINALYSIS, W/ REFLEX TO CULTURE (INFECTION SUSPECTED)
Bacteria, UA: NONE SEEN
Bilirubin Urine: NEGATIVE
Glucose, UA: NEGATIVE mg/dL
Hgb urine dipstick: NEGATIVE
Ketones, ur: NEGATIVE mg/dL
Leukocytes,Ua: NEGATIVE
Nitrite: NEGATIVE
Protein, ur: NEGATIVE mg/dL
Specific Gravity, Urine: 1.016 (ref 1.005–1.030)
pH: 6 (ref 5.0–8.0)

## 2023-06-15 LAB — RAPID URINE DRUG SCREEN, HOSP PERFORMED
Amphetamines: NOT DETECTED
Barbiturates: NOT DETECTED
Benzodiazepines: NOT DETECTED
Cocaine: NOT DETECTED
Opiates: NOT DETECTED
Tetrahydrocannabinol: NOT DETECTED

## 2023-06-15 LAB — VITAMIN B12: Vitamin B-12: 277 pg/mL (ref 180–914)

## 2023-06-15 LAB — MAGNESIUM: Magnesium: 1.8 mg/dL (ref 1.7–2.4)

## 2023-06-15 LAB — CBC
HCT: 37.7 % — ABNORMAL LOW (ref 39.0–52.0)
Hemoglobin: 12.3 g/dL — ABNORMAL LOW (ref 13.0–17.0)
MCH: 27.6 pg (ref 26.0–34.0)
MCHC: 32.6 g/dL (ref 30.0–36.0)
MCV: 84.7 fL (ref 80.0–100.0)
Platelets: 190 10*3/uL (ref 150–400)
RBC: 4.45 MIL/uL (ref 4.22–5.81)
RDW: 15 % (ref 11.5–15.5)
WBC: 7.6 10*3/uL (ref 4.0–10.5)
nRBC: 0 % (ref 0.0–0.2)

## 2023-06-15 LAB — HEMOGLOBIN A1C
Hgb A1c MFr Bld: 7.8 % — ABNORMAL HIGH (ref 4.8–5.6)
Mean Plasma Glucose: 177.16 mg/dL

## 2023-06-15 LAB — TSH: TSH: 3.498 u[IU]/mL (ref 0.350–4.500)

## 2023-06-15 LAB — GLUCOSE, CAPILLARY
Glucose-Capillary: 186 mg/dL — ABNORMAL HIGH (ref 70–99)
Glucose-Capillary: 218 mg/dL — ABNORMAL HIGH (ref 70–99)

## 2023-06-15 LAB — T4, FREE: Free T4: 0.79 ng/dL (ref 0.61–1.12)

## 2023-06-15 LAB — FOLATE: Folate: 17.2 ng/mL (ref >5.9)

## 2023-06-15 MED ORDER — IOHEXOL 350 MG/ML SOLN
75.0000 mL | Freq: Once | INTRAVENOUS | Status: AC | PRN
  Administered 2023-06-15: 75 mL via INTRAVENOUS

## 2023-06-15 MED ORDER — ACETAMINOPHEN 650 MG RE SUPP: 650.0000 mg | RECTAL | Status: DC | PRN

## 2023-06-15 MED ORDER — SENNOSIDES-DOCUSATE SODIUM 8.6-50 MG PO TABS
1.0000 | ORAL_TABLET | Freq: Every evening | ORAL | Status: DC | PRN
Start: 2023-06-15 — End: 2023-06-16

## 2023-06-15 MED ORDER — ACETAMINOPHEN 325 MG PO TABS
650.0000 mg | ORAL_TABLET | ORAL | Status: DC | PRN
Start: 2023-06-15 — End: 2023-06-16
  Administered 2023-06-15: 650 mg via ORAL
  Filled 2023-06-15 (×3): qty 2

## 2023-06-15 MED ORDER — VITAMIN B-12 100 MCG PO TABS
500.0000 ug | ORAL_TABLET | Freq: Every day | ORAL | Status: DC
  Administered 2023-06-16: 500 ug via ORAL
  Filled 2023-06-15: qty 5

## 2023-06-15 MED ORDER — ENOXAPARIN SODIUM 40 MG/0.4ML IJ SOSY
40.0000 mg | PREFILLED_SYRINGE | INTRAMUSCULAR | Status: DC
Start: 2023-06-15 — End: 2023-06-16
  Administered 2023-06-15: 40 mg via SUBCUTANEOUS
  Filled 2023-06-15: qty 0.4

## 2023-06-15 MED ORDER — STROKE: EARLY STAGES OF RECOVERY BOOK: Freq: Once | Status: AC

## 2023-06-15 MED ORDER — PERFLUTREN LIPID MICROSPHERE
1.0000 mL | INTRAVENOUS | Status: AC | PRN
  Administered 2023-06-15: 1 mL via INTRAVENOUS

## 2023-06-15 MED ORDER — SODIUM CHLORIDE 0.9 % IV BOLUS
1000.0000 mL | Freq: Once | INTRAVENOUS | Status: AC
  Administered 2023-06-15: 1000 mL via INTRAVENOUS

## 2023-06-15 MED ORDER — ATORVASTATIN CALCIUM 40 MG PO TABS
80.0000 mg | ORAL_TABLET | Freq: Every day | ORAL | Status: DC
  Administered 2023-06-16: 80 mg via ORAL
  Filled 2023-06-15: qty 2

## 2023-06-15 MED ORDER — LORAZEPAM 2 MG/ML IJ SOLN: 1.0000 mg | Freq: Four times a day (QID) | INTRAMUSCULAR | Status: DC | PRN

## 2023-06-15 MED ORDER — INSULIN ASPART 100 UNIT/ML IJ SOLN
0.0000 [IU] | Freq: Every day | INTRAMUSCULAR | Status: DC
  Administered 2023-06-15: 2 [IU] via SUBCUTANEOUS

## 2023-06-15 MED ORDER — HALOPERIDOL LACTATE 5 MG/ML IJ SOLN
INTRAMUSCULAR | Status: AC
  Administered 2023-06-15: 5 mg via INTRAMUSCULAR
  Filled 2023-06-15: qty 1

## 2023-06-15 MED ORDER — INSULIN GLARGINE-YFGN 100 UNIT/ML ~~LOC~~ SOLN
20.0000 [IU] | Freq: Every day | SUBCUTANEOUS | Status: DC
  Administered 2023-06-15: 20 [IU] via SUBCUTANEOUS
  Filled 2023-06-15 (×2): qty 0.2

## 2023-06-15 MED ORDER — HYDRALAZINE HCL 20 MG/ML IJ SOLN: 10.0000 mg | Freq: Four times a day (QID) | INTRAMUSCULAR | Status: DC | PRN

## 2023-06-15 MED ORDER — ASPIRIN 81 MG PO TBEC
81.0000 mg | DELAYED_RELEASE_TABLET | Freq: Every day | ORAL | Status: DC
  Administered 2023-06-15 – 2023-06-16 (×2): 81 mg via ORAL
  Filled 2023-06-15 (×2): qty 1

## 2023-06-15 MED ORDER — CYANOCOBALAMIN 1000 MCG/ML IJ SOLN
1000.0000 ug | Freq: Once | INTRAMUSCULAR | Status: AC
  Administered 2023-06-15: 1000 ug via INTRAMUSCULAR
  Filled 2023-06-15: qty 1

## 2023-06-15 MED ORDER — INSULIN ASPART 100 UNIT/ML IJ SOLN
0.0000 [IU] | Freq: Three times a day (TID) | INTRAMUSCULAR | Status: DC
  Administered 2023-06-15 – 2023-06-16 (×2): 3 [IU] via SUBCUTANEOUS
  Administered 2023-06-16: 8 [IU] via SUBCUTANEOUS

## 2023-06-15 MED ORDER — ACETAMINOPHEN 160 MG/5ML PO SOLN: 650.0000 mg | ORAL | Status: DC | PRN

## 2023-06-15 MED ORDER — SODIUM CHLORIDE 0.9 % IV SOLN: INTRAVENOUS | Status: DC

## 2023-06-15 MED ORDER — HALOPERIDOL LACTATE 5 MG/ML IJ SOLN
5.0000 mg | Freq: Once | INTRAMUSCULAR | Status: AC
  Filled 2023-06-15: qty 1

## 2023-06-15 MED ORDER — CLOPIDOGREL BISULFATE 75 MG PO TABS
75.0000 mg | ORAL_TABLET | Freq: Every day | ORAL | Status: DC
  Administered 2023-06-15 – 2023-06-16 (×2): 75 mg via ORAL
  Filled 2023-06-15 (×2): qty 1

## 2023-06-15 NOTE — Progress Notes (Signed)
 Patient removed IV and put on clothes, states "I am leaving, I want to go home.".  Consulting civil engineer and Dr. Winferd Hatter notified.  I also called family and notified them of patients attempting to leave.  Per wife, the patients son Rich Champ) is on his way to the hospital.  Patient is very angry and refused to allow staff to assist him with walking.  He states, "do not touch me, I don't need your help walking."

## 2023-06-15 NOTE — ED Notes (Signed)
 ED TO INPATIENT HANDOFF REPORT  ED Nurse Name and Phone #: Raeann Buhl, RN [phone]  S Name/Age/Gender Jolan Natal 78 y.o. male Room/Bed: APA03/APA03  Code Status   Code Status: Full Code  Home/SNF/Other Home Patient oriented to: self, place, time, and situation Is this baseline? Yes   Triage Complete: Triage complete  Chief Complaint Stroke-like symptom [R29.90]  Triage Note Patient started taking Meclizine yesterday and N/V started. Was dizzy all day yesterday and went to [facility]. Who prescribed the medication.   Allergies No Known Allergies  Level of Care/Admitting Diagnosis ED Disposition     ED Disposition  Admit   Condition  --   Comment  Hospital Area: [facility] [[id number]]  Level of Care: Telemetry [5]  Covid Evaluation: Symptomatic Person Under Investigation (PUI) or recent exposure (last 10 days) *Testing Required*  Diagnosis: Stroke-like symptom [829562]  Admitting Physician: TAT, DAVID [[id number]]  Attending Physician: TAT, DAVID [[id number]]          B Medical/Surgery History Past Medical History:  Diagnosis Date   Ascending aortic aneurysm (HCC)    4.1 cm in 2019   Chronic back pain    Coronary artery disease    Status post CABG 2014 (LIMA to LAD, SVG to ramus, SVG to OM 2)   Dermatophytosis of the body    Eczema    Essential hypertension    GERD (gastroesophageal reflux disease)    Herpes zoster without mention of complication    History of bronchitis    History of claustrophobia    HOH (hard of hearing)    Mixed hyperlipidemia    Prostate cancer (HCC)    Psoriasis and similar disorder    Sinusitis    Type 2 diabetes mellitus (HCC)    Past Surgical History:  Procedure Laterality Date   APPLICATION OF WOUND VAC Left 07/23/2022   Procedure: APPLICATION OF WOUND VAC;  Surgeon: Shon Downing, MD;  Location: [facility] OR;  Service: Thoracic;  Laterality: Left;   APPLICATION OF WOUND VAC Left 07/26/2022   Procedure:  APPLICATION OF WOUND VAC  CHANGE;  Surgeon: Shon Downing, MD;  Location: [facility] OR;  Service: Thoracic;  Laterality: Left;   CATARACT EXTRACTION W/PHACO Left 04/04/2014   Procedure: CATARACT EXTRACTION PHACO AND INTRAOCULAR LENS PLACEMENT (IOC);  Surgeon: Clay Cummins, MD;  Location: [facility] ORS;  Service: Ophthalmology;  Laterality: Left;  CDE:4.49   CATARACT EXTRACTION W/PHACO Right 01/02/2015   Procedure: CATARACT EXTRACTION PHACO AND INTRAOCULAR LENS PLACEMENT; CDE:  6.36;  Surgeon: Clay Cummins, MD;  Location: [facility];  Service: Ophthalmology;  Laterality: Right;   CIRCUMCISION  2010   CORONARY ARTERY BYPASS GRAFT N/A 08/13/2012   Procedure: CORONARY ARTERY BYPASS GRAFTING (CABG);  Surgeon: Heriberto London, MD;  Location: [facility] OR;  Service: Open Heart Surgery;  Laterality: N/A;   ENDOVEIN HARVEST OF GREATER SAPHENOUS VEIN Right 08/13/2012   Procedure: ENDOVEIN HARVEST OF GREATER SAPHENOUS VEIN;  Surgeon: Heriberto London, MD;  Location: [facility] OR;  Service: Open Heart Surgery;  Laterality: Right;   INTRAOPERATIVE TRANSESOPHAGEAL ECHOCARDIOGRAM N/A 08/13/2012   Procedure: INTRAOPERATIVE TRANSESOPHAGEAL ECHOCARDIOGRAM;  Surgeon: Heriberto London, MD;  Location: [facility] OR;  Service: Open Heart Surgery;  Laterality: N/A;   LEFT HEART CATHETERIZATION WITH CORONARY ANGIOGRAM N/A 08/07/2012   Procedure: LEFT HEART CATHETERIZATION WITH CORONARY ANGIOGRAM;  Surgeon: Peter M Swaziland, MD;  Location: [facility] CATH LAB;  Service: Cardiovascular;  Laterality: N/A;   RADIOACTIVE SEED IMPLANT N/A 11/25/2017   Procedure:  RADIOACTIVE SEED IMPLANT/BRACHYTHERAPY IMPLANT;  Surgeon: Homero Luster, MD;  Location: [facility];  Service: Urology;  Laterality: N/A;   SEED IMPLANT DONE AT [place]  01/2017   STERNAL WOUND DEBRIDEMENT Left 07/23/2022   Procedure: LEFT STERNOCLAVICULAR JOINT DEBRIDEMENT;  Surgeon: Shon Downing, MD;  Location: [facility] OR;  Service: Thoracic;  Laterality: Left;   STERNAL WOUND DEBRIDEMENT Left  07/26/2022   Procedure: LEFT STERNOCLAVICULAR JOINT DEBRIDEMENT  WITH MYRIAD PLACEMENT;  Surgeon: Shon Downing, MD;  Location: [facility] OR;  Service: Thoracic;  Laterality: Left;   STERNAL WOUND DEBRIDEMENT Left 07/30/2022   Procedure: LEFT STERNOCLAVICULAR JOINT DEBRIDEMENT;  Surgeon: Shon Downing, MD;  Location: [facility] OR;  Service: Thoracic;  Laterality: Left;   TEE WITHOUT CARDIOVERSION N/A 06/12/2022   Procedure: TRANSESOPHAGEAL ECHOCARDIOGRAM;  Surgeon: Maudine Sos, MD;  Location: [facility] INVASIVE CV LAB;  Service: Cardiovascular;  Laterality: N/A;   WOUND EXPLORATION Left 09/26/2022   Procedure: DEBRIDEMENT LEFT STERNOCLAVICULAR JOINT;  Surgeon: Shon Downing, MD;  Location: [facility] OR;  Service: Thoracic;  Laterality: Left;     A IV Location/Drains/Wounds Patient Lines/Drains/Airways Status     Active Line/Drains/Airways     Name Placement date Placement time Site Days   Peripheral IV 06/15/23 20 G Right Antecubital 06/15/23  0950  Antecubital  less than 1   Wound / Incision (Open or Dehisced) 06/05/22 Laceration Pretibial Left;Proximal 06/05/22  1743  Pretibial  375            Intake/Output Last 24 hours  Intake/Output Summary (Last 24 hours) at 06/15/2023 1229 Last data filed at 06/15/2023 1132 Gross per 24 hour  Intake 1090 ml  Output 750 ml  Net 340 ml    Labs/Imaging Results for orders placed or performed during the hospital encounter of 06/15/23 (from the past 48 hours)  Urinalysis, w/ Reflex to Culture (Infection Suspected) -Urine, Clean Catch     Status: Abnormal   Collection Time: 06/15/23  9:54 AM  Result Value Ref Range   Specimen Source URINE, CLEAN CATCH    Color, Urine STRAW (A) YELLOW   APPearance CLEAR CLEAR   Specific Gravity, Urine 1.016 1.005 - 1.030   pH 6.0 5.0 - 8.0   Glucose, UA NEGATIVE NEGATIVE mg/dL   Hgb urine dipstick NEGATIVE NEGATIVE   Bilirubin Urine NEGATIVE NEGATIVE   Ketones, ur NEGATIVE NEGATIVE mg/dL   Protein, ur NEGATIVE NEGATIVE mg/dL    Nitrite NEGATIVE NEGATIVE   Leukocytes,Ua NEGATIVE NEGATIVE   RBC / HPF 0-5 0 - 5 RBC/hpf   WBC, UA 0-5 0 - 5 WBC/hpf    Comment:        Reflex urine culture not performed if WBC <=10, OR if Squamous epithelial cells >5. If Squamous epithelial cells >5 suggest recollection.    Bacteria, UA NONE SEEN NONE SEEN   Squamous Epithelial / HPF 0-5 0 - 5 /HPF   Hyaline Casts, UA PRESENT     Comment: Performed at [facility], [address] [place], [place] [address]  Basic metabolic panel     Status: Abnormal   Collection Time: 06/15/23 11:28 AM  Result Value Ref Range   Sodium 136 135 - 145 mmol/L   Potassium 3.6 3.5 - 5.1 mmol/L   Chloride 97 (L) 98 - 111 mmol/L   CO2 28 22 - 32 mmol/L   Glucose, Bld 150 (H) 70 - 99 mg/dL    Comment: Glucose reference range applies only to samples taken after fasting for at least 8 hours.  BUN 23 8 - 23 mg/dL   Creatinine, Ser 5.78 0.61 - 1.24 mg/dL   Calcium  9.1 8.9 - 10.3 mg/dL   GFR, Estimated >46 >96 mL/min    Comment: (NOTE) Calculated using the CKD-EPI Creatinine Equation (2021)    Anion gap 11 5 - 15    Comment: Performed at [facility], [address], [place], [place] [address]  Magnesium      Status: None   Collection Time: 06/15/23 11:28 AM  Result Value Ref Range   Magnesium  1.8 1.7 - 2.4 mg/dL    Comment: Performed at [facility], [address], [place], [place] [address]   DG Chest Port 1 View Result Date: 06/15/2023 CLINICAL DATA:  Dizziness with nausea. EXAM: PORTABLE CHEST 1 VIEW COMPARISON:  09/26/2022 FINDINGS: The cardio pericardial silhouette is enlarged. The lungs are clear without focal pneumonia, edema, pneumothorax or pleural effusion. No acute bony abnormality. Telemetry leads overlie the chest. IMPRESSION: Enlargement of the cardiopericardial silhouette without acute cardiopulmonary findings. Electronically Signed   By: Donnal Fusi M.D.   On: 06/15/2023 12:06   CT ANGIO HEAD NECK W WO CM Result Date:  06/15/2023 EXAM: CT HEAD WITHOUT CTA HEAD AND NECK WITH AND WITHOUT 06/15/2023 10:21:28 AM TECHNIQUE: CTA of the head and neck was performed with and without the administration of intravenous contrast. Noncontrast CT of the head with reconstructed 2-D images are also provided for review. Multiplanar 2D and/or 3D reformatted images are provided for review. Automated exposure control, iterative reconstruction, and/or weight based adjustment of the mA/kV was utilized to reduce the radiation dose to as low as reasonably achievable. COMPARISON: 08/12/2018 CLINICAL HISTORY: Neuro deficit, acute, stroke suspected; ataxia, nausea. Started taking Meclizine yesterday and N/V started. Was dizzy all day yesterday and went to [facility]. FINDINGS: CT HEAD: BRAIN AND VENTRICLES: No acute intracranial hemorrhage. No mass effect or midline shift. No extra-axial fluid collection. Gray-white differentiation is maintained. No hydrocephalus. Generalized volume loss within expected range for patient age. ORBITS: No acute abnormality. SINUSES: No acute abnormality. SOFT TISSUES AND SKULL: No acute abnormality. CTA NECK: AORTIC ARCH AND ARCH VESSELS: No dissection or arterial injury. No significant stenosis of the brachiocephalic or subclavian arteries. CERVICAL CAROTID ARTERIES: No dissection, arterial injury, or hemodynamically significant stenosis by NASCET criteria. Mild calcified plaque along the bilateral carotid bulbs without hemodynamically significant stenosis. CERVICAL VERTEBRAL ARTERIES: The right vertebral artery is patent to the basilar confluence without significant stenosis or evidence of dissection. VISUALIZED LUNGS AND MEDIASTINUM: Unremarkable. SOFT TISSUES: No acute abnormality. BONES: Chronic-appearing deformity of the left clavicular head with widening of the left sternoclavicular joint. CTA HEAD: ANTERIOR CIRCULATION: No significant stenosis of the internal carotid arteries. No significant stenosis of the  anterior cerebral arteries. No significant stenosis of the middle cerebral arteries. No aneurysm. Atherosclerotic calcifications of the carotid siphons without significant stenosis or aneurysm. POSTERIOR CIRCULATION: No significant stenosis of the posterior cerebral arteries. No significant stenosis of the basilar artery. Calcified plaque along the left V4 segment of the vertebral artery results in at least mild stenosis. No aneurysm. OTHER: No dural venous sinus thrombosis on this non-dedicated study. IMPRESSION: 1. No acute intracranial abnormality. 2. No large vessel occlusion, hemodynamically significant stenosis, or aneurysm in the head or neck. Electronically signed by: Audra Blend MD 06/15/2023 10:34 AM EDT RP Workstation: [id number]    Pending Labs Unresulted Labs (From admission, onward)     Start     Ordered   06/15/23 1128  TSH  Once,  URGENT        06/15/23 1127   Pending  Lipase, blood  Once,   R        Pending   Pending  Comprehensive metabolic panel  Once,   R        Pending   Pending  CBC  Once,   R        Pending   Pending  Urinalysis, Routine w reflex microscopic -  Once,   R        Pending   Signed and Held  Lipid panel  (Labs)  Tomorrow morning,   R       Comments: Fasting    Signed and Held   Signed and Held  Hemoglobin A1c  (Labs)  Once,   R       Comments: To assess prior glycemic control    Signed and Held   Signed and Held  CBC  (enoxaparin  (LOVENOX )    CrCl >/= 30 ml/min)  Once,   R       Comments: Baseline for enoxaparin  therapy IF NOT ALREADY DRAWN.  Notify MD if PLT < 100 K.    Signed and Held   Signed and Held  Vitamin B12  Once,   R        Signed and Held   Signed and Held  Folate  Once,   R        Signed and Held   Signed and Held  T4, free  Once,   R        Signed and Held   Signed and Held  Rapid urine drug screen (hospital performed)  ONCE - STAT,   R        Signed and Held   Signed and Held  Hemoglobin A1c  Once,   R        Signed and Held             Vitals/Pain Today's Vitals   06/15/23 1130 06/15/23 1132 06/15/23 1145 06/15/23 1200  BP: (!) 160/80   (!) 155/80  Pulse: 98   93  Resp:   (!) 24   Temp:  98.1 F (36.7 C)    TempSrc:  Oral    SpO2: 96%   97%  Weight:      Height:      PainSc:        Isolation Precautions No active isolations  Medications Medications  sodium chloride  0.9 % bolus 1,000 mL (0 mLs Intravenous Stopped 06/15/23 1109)  iohexol  (OMNIPAQUE ) 350 MG/ML injection 75 mL (75 mLs Intravenous Contrast Given 06/15/23 1013)    Mobility walks     Focused Assessments Neuro Assessment Handoff:  Swallow screen pass? No  Cardiac Rhythm: Normal sinus rhythm       Neuro Assessment:   Neuro Checks:      Has TPA been given? No If patient is a Neuro Trauma and patient is going to OR before floor call report to 4N Charge nurse: [phone] or [phone]   R Recommendations: See Admitting Provider Note  Report given to:   Additional Notes: Patient report dysphagia. Swallowed all 3 oz of water  and immediately began coughing. A/O x 4, very forgetful. Unsteady gait on long distances. ?OSA, sats dropped while sleeping to low 80s, HR in/out of ST, NSR, Afib with SVR (50s).

## 2023-06-15 NOTE — ED Triage Notes (Signed)
 Patient started taking Meclizine yesterday and N/V started. Was dizzy all day yesterday and went to [facility]. Who prescribed the medication.

## 2023-06-15 NOTE — Plan of Care (Signed)
 Called by ED provider at [facility]. Last known well 2 days ago outside the window for intervention or TNK-presenting with initially what was vertiginous symptoms but he describes as unsteady gait and family also describes difficulty walking and difficulty swallowing.  He has failed a swallow screen. He is extremely claustrophobic and refuses to get an MRI unless it is an open MRI. Based on the story, could be a posterior circulation stroke.  I would recommend admitting to the hospitalist for stroke risk factor workup.  CTA has already been done.  No ELVO. Repeat head CT tomorrow.  Obtain 2D echo, A1c, lipid panel. Obtain routine consultation with the neurologist on-call for routine consults on Monday morning.  -- Tona Francis, MD Neurologist [facility]

## 2023-06-15 NOTE — Hospital Course (Addendum)
 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  The patient states that he woke up on the morning of 06/14/2023 with the above symptoms.  He states that the symptoms are precipitated by sitting up or standing up and can last up to 30 minutes.  He denies loss of consciousness, but he did have an episode where he fell back onto the bed.  He has had some unsteady gait as a result of his dizziness.  He does not particularly feel like the room is spinning around.  The patient denies any alcohol  use or new medications.  He states that he did have some left upper extremity numbness and weakness that lasted about an hour on 06/14/2023.  He has not had recurrence of this episode since that.  He denies facial droop, word finding difficulties, dysarthria, or other dysesthesia.  Interestingly, he states that he feels like he is having some difficulty swallowing and choking on liquids for the past 2 days.  He denies any headache, visual disturbance, fevers, chills, chest pain, shortness of breath, coughing, hemoptysis.  Notably, granddaughter at the bedside states that there has been some viral respiratory symptoms going around at home.  The patient had nausea and vomiting intermittently for the last 2 days.  He also had some loose stools earlier this week which have resolved.  His oral intake has not been great this week.  He denies any frank fevers.  There is no abdominal pain, hematochezia, melena, dysuria.  He went to [facility] ED on 06/14/2023.  CT of the brain was negative.  The patient was sent home with meclizine.  His BMP was unremarkable with serum creatinine 0.92 but showed potassium 3.3 on 06/14/2023.  CBC showed WBC 7.0, hemoglobin 13.1, platelets 177.  CTA head and neck was negative for any LVO.  There is no hemodynamically significant stenosis in the carotids.   No hemodynamically significant stenosis in the vertebral arteries.  In the ED, the patient was afebrile hemodynamically stable with oxygen saturation 94% room air.  When he fell asleep, his oxygen saturation went down to 86%. BMP showed sodium 136, potassium 3.6, bicarbonate 28, serum creatinine 0.88.  Magnesium  1.8.

## 2023-06-15 NOTE — H&P (Signed)
 History and Physical    Patient: George [name] XBJ:[id number] DOB: 04/05/1945 DOA: 06/15/2023 DOS: the patient was seen and examined on 06/15/2023 PCP: Theoplis Fix, MD  Patient coming from: Home  Chief Complaint:  Chief Complaint  Patient presents with   Nausea    Pt brought in via POV for N/V after "starting a new medicine yesterday." Also present with generalized rash to lower abdomen.   HPI: George [name] is a 78 year old male with 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  The patient states that he woke up on the morning of 06/14/2023 with the above symptoms.  He states that the symptoms are precipitated by sitting up or standing up and can last up to 30 minutes.  He denies loss of consciousness, but he did have an episode where he fell back onto the bed.  He has had some unsteady gait as a result of his dizziness.  He does not particularly feel like the room is spinning around.  The patient denies any alcohol  use or new medications.  He states that he did have some left upper extremity numbness and weakness that lasted about an hour on 06/14/2023.  He has not had recurrence of this episode since that.  He denies facial droop, word finding difficulties, dysarthria, or other dysesthesia.  Interestingly, he states that he feels like he is having some difficulty swallowing and choking on liquids for the past 2 days.  He denies any headache, visual disturbance, fevers, chills, chest pain, shortness of breath, coughing, hemoptysis.  Notably, granddaughter at the bedside states that there has been some viral respiratory symptoms going around at home.  The patient had nausea and vomiting intermittently for the last 2 days.  He also had some loose stools earlier this week which have resolved.  His oral intake has not been great this week.  He denies any frank fevers.   There is no abdominal pain, hematochezia, melena, dysuria.  He went to [facility] ED on 06/14/2023.  CT of the brain was negative.  The patient was sent home with meclizine.  His BMP was unremarkable with serum creatinine 0.92 but showed potassium 3.3 on 06/14/2023.  CBC showed WBC 7.0, hemoglobin 13.1, platelets 177.  In the ED, the patient was afebrile hemodynamically stable with oxygen saturation 94% room air.  When he fell asleep, his oxygen saturation went down to 86%. BMP showed sodium 136, potassium 3.6, bicarbonate 28, serum creatinine 0.88.  Magnesium  1.8.   Review of Systems: As mentioned in the history of present illness. All other systems reviewed and are negative. Past Medical History:  Diagnosis Date   Ascending aortic aneurysm (HCC)    4.1 cm in 2019   Chronic back pain    Coronary artery disease    Status post CABG 2014 (LIMA to LAD, SVG to ramus, SVG to OM 2)   Dermatophytosis of the body    Eczema    Essential hypertension    GERD (gastroesophageal reflux disease)    Herpes zoster without mention of complication    History of bronchitis    History of claustrophobia    HOH (hard of hearing)    Mixed hyperlipidemia    Prostate cancer (HCC)    Psoriasis and similar disorder    Sinusitis    Type 2 diabetes mellitus (HCC)    Past Surgical History:  Procedure Laterality Date   APPLICATION  OF WOUND VAC Left 07/23/2022   Procedure: APPLICATION OF WOUND VAC;  Surgeon: Shon Downing, MD;  [facility]: [facility];  Service: Thoracic;  Laterality: Left;   APPLICATION OF WOUND VAC Left 07/26/2022   Procedure: APPLICATION OF WOUND VAC  CHANGE;  Surgeon: Shon Downing, MD;  Location: [facility] OR;  Service: Thoracic;  Laterality: Left;   CATARACT EXTRACTION W/PHACO Left 04/04/2014   Procedure: CATARACT EXTRACTION PHACO AND INTRAOCULAR LENS PLACEMENT (IOC);  Surgeon: Clay Cummins, MD;  Location: [facility];  Service: Ophthalmology;  Laterality: Left;  CDE:4.49   CATARACT EXTRACTION W/PHACO Right  01/02/2015   Procedure: CATARACT EXTRACTION PHACO AND INTRAOCULAR LENS PLACEMENT; CDE:  6.36;  Surgeon: Clay Cummins, MD;  Location: [facility];  Service: Ophthalmology;  Laterality: Right;   CIRCUMCISION  2010   CORONARY ARTERY BYPASS GRAFT N/A 08/13/2012   Procedure: CORONARY ARTERY BYPASS GRAFTING (CABG);  Surgeon: Heriberto London, MD;  Location[facility] OR;  Service: Open Heart Surgery;  Laterality: N/A;   ENDOVEIN HARVEST OF GREATER SAPHENOUS VEIN Right 08/13/2012   Procedure: ENDOVEIN HARVEST OF GREATER SAPHENOUS VEIN;  Surgeon: Heriberto London, MD;  Location: [facility] OR;  Service: Open Heart Surgery;  Laterality: Right;   INTRAOPERATIVE TRANSESOPHAGEAL ECHOCARDIOGRAM N/A 08/13/2012   Procedure: INTRAOPERATIVE TRANSESOPHAGEAL ECHOCARDIOGRAM;  Surgeon: Heriberto London, MD;  Location: [facility] OR;  Service: Open Heart Surgery;  Laterality: N/A;   LEFT HEART CATHETERIZATION WITH CORONARY ANGIOGRAM N/A 08/07/2012   Procedure: LEFT HEART CATHETERIZATION WITH CORONARY ANGIOGRAM;  Surgeon: Peter M Swaziland, MD;  Location: [facility] CATH LAB;  Service: Cardiovascular;  Laterality: N/A;   RADIOACTIVE SEED IMPLANT N/A 11/25/2017   Procedure: RADIOACTIVE SEED IMPLANT/BRACHYTHERAPY IMPLANT;  Surgeon: Homero Luster, MD;  Location: [facility];  Service: Urology;  Laterality: N/A;   SEED IMPLANT DONE AT   01/2017   STERNAL WOUND DEBRIDEMENT Left 07/23/2022   Procedure: LEFT STERNOCLAVICULAR JOINT DEBRIDEMENT;  Surgeon: Shon Downing, MD;  Location: [facility] OR;  Service: Thoracic;  Laterality: Left;   STERNAL WOUND DEBRIDEMENT Left 07/26/2022   Procedure: LEFT STERNOCLAVICULAR JOINT DEBRIDEMENT  WITH MYRIAD PLACEMENT;  Surgeon: Shon Downing, MD;  [facility];  Service: Thoracic;  Laterality: Left;   STERNAL WOUND DEBRIDEMENT Left 07/30/2022   Procedure: LEFT STERNOCLAVICULAR JOINT DEBRIDEMENT;  Surgeon: Shon Downing, MD;  Location: [facility] OR;  Service: Thoracic;  Laterality: Left;   TEE WITHOUT CARDIOVERSION N/A  06/12/2022   Procedure: TRANSESOPHAGEAL ECHOCARDIOGRAM;  Surgeon: Maudine Sos, MD;  Location: [facility] INVASIVE CV LAB;  Service: Cardiovascular;  Laterality: N/A;   WOUND EXPLORATION Left 09/26/2022   Procedure: DEBRIDEMENT LEFT STERNOCLAVICULAR JOINT;  Surgeon: Shon Downing, MD;  Location: [facility] OR;  Service: Thoracic;  Laterality: Left;   Social History:  reports that he quit smoking about 56 years ago. His smoking use included cigarettes. He started smoking about 58 years ago. He has a 4 pack-year smoking history. He has been exposed to tobacco smoke. He has never used smokeless tobacco. He reports that he does not drink alcohol  and does not use drugs.  No Known Allergies  Family History  Problem Relation Age of Onset   CAD Other    Heart attack Other    Diabetes Other    Cataracts Mother    Amblyopia Neg Hx    Blindness Neg Hx    Glaucoma Neg Hx    Macular degeneration Neg Hx    Retinal detachment Neg Hx    Strabismus Neg Hx    Retinitis pigmentosa  Neg Hx     Prior to Admission medications   Medication Sig Start Date End Date Taking? Authorizing Provider  acetaminophen  (TYLENOL ) 500 MG tablet Take 1,000 mg by mouth every 6 (six) hours as needed for moderate pain.    [provider]  amLODipine  (NORVASC ) 10 MG tablet Take 10 mg by mouth in the morning.    [provider]  atorvastatin  (LIPITOR ) 80 MG tablet Take 1 tablet (80 mg total) by mouth daily. 06/14/22   Ozell Blunt, MD  carvedilol  (COREG ) 12.5 MG tablet TAKE 1 TABLET BY MOUTH TWICE DAILY 05/02/23   Gerard Knight, MD  furosemide  (LASIX ) 40 MG tablet Take 1 tablet (40 mg total) by mouth daily. 11/21/22   Lasalle Pointer, NP  hydrALAZINE  (APRESOLINE ) 25 MG tablet TAKE 1 TABLET BY MOUTH THREE TIMES DAILY 01/13/23   Shon Downing, MD  insulin  glargine (LANTUS ) 100 UNIT/ML Solostar Pen Inject 70 Units into the skin at bedtime. Take 40 units twice daily    [provider]  Multiple  Vitamins-Minerals (PRESERVISION AREDS 2) CAPS Take 1 capsule by mouth 2 (two) times daily.    [provider]  nitroGLYCERIN  (NITROSTAT ) 0.4 MG SL tablet Place 1 tablet (0.4 mg total) under the tongue every 5 (five) minutes x 3 doses as needed for chest pain (if no relief after 3rd dose, proceed to ED for evaluation or call 911). 04/24/22   Gerard Knight, MD  NOVOLOG  FLEXPEN 100 UNIT/ML FlexPen INJECT 25-31 UNITS INTO THE SKIN THREE TIMES DAILY BEFORE MEALS Patient taking differently: Inject 50 Units into the skin in the morning, at noon, and at bedtime. 09/18/17   Nida, Gebreselassie W, MD  zinc gluconate 50 MG tablet Take 50 mg by mouth daily.    [provider]    Physical Exam: Vitals:   06/15/23 0918 06/15/23 1000 06/15/23 1100 06/15/23 1132  BP:  (!) 149/62 (!) 175/82   Pulse:  78 93   Resp:  14 11   Temp:    98.1 F (36.7 C)  TempSrc:    Oral  SpO2:  (!) 86% 97%   Weight: 99.8 kg     Height: 6\' 3"  (1.905 m)      GENERAL:  A&O x 3, NAD, well developed, cooperative, follows commands HEENT: [place]/AT, No thrush, No icterus, No oral ulcers Neck:  No neck mass, No meningismus, soft, supple CV: RRR, no S3, no S4, no rub, no JVD Lungs:  CTA, no wheeze, no rhonchi, good air movement Abd: soft/NT +BS, nondistended Ext: No edema, no lymphangitis, no cyanosis, no rashes Neuro:  CN II-XII intact, strength 4/5 in RUE, RLE, strength 4/5 LUE, LLE; sensation intact bilateral; no dysmetria; babinski equivocal  Data Reviewed: Data reviewed above in hx  Assessment and Plan: Dizziness and sensory disturbance/left hemiparesis - Concerned about stroke - Unfortunately, patient states that he is not able to obtain an MRI unless he is intubated - Case was discussed with Dr. Bonnita Buttner who did not feel that MRI would change plan of treatment and that patient can state any pending with neurology follow-up 6/2 - Start aspirin  and Plavix - Check lipids  - Check A1c - Speech therapy  evaluation - PT/OT - Echocardiogram - B12 - Folic acid - TSH - Check orthostatics - UA negative for pyuria - UDS - Check COVID-19 - Start judicious IV fluids  New onset afib -going in and out of afib on tele -EKG--afib, nonspecific ST change -cardiology eval in am  Diabetes mellitus type 2 - Start reduced dose Semglee  - NovoLog  sliding scale - Check A1c  Essential hypertension - Allow for permissive hypertension in the next 24 hours  Mixed hyperlipidemia - Continue statin  Coronary artery disease with history of CABG - No chest pain presently - Continue statin - Antiplatelet therapy as discussed above  Chronic HFrEF - 05/28/2022 echo EF 45 to 50%, global HK, mild MR, normal RVF - Clinically euvolemic - hold lasix  for 24 hours pending further work up in setting of his dizziness     Advance Care Planning: FULL  Consults: neurology  Family Communication: grand daughter 6/1  Severity of Illness: The appropriate patient status for this patient is OBSERVATION. Observation status is judged to be reasonable and necessary in order to provide the required intensity of service to ensure the patient's safety. The patient's presenting symptoms, physical exam findings, and initial radiographic and laboratory data in the context of their medical condition is felt to place them at decreased risk for further clinical deterioration. Furthermore, it is anticipated that the patient will be medically stable for discharge from the hospital within 2 midnights of admission.   Author: Demaris Fillers, MD 06/15/2023 12:08 PM  For on call review [email or web address].

## 2023-06-15 NOTE — Progress Notes (Addendum)
 Responded to nursing call:  pt appears confused, angry, pulled out IV   Subjective: Pt is angry.  Wants to leave the hospital.  Appears confused.  Now with poor insight on why he's in hospital.  Feels like staff is insulting him.  Denies cp, sob  Vitals:   06/15/23 1200 06/15/23 1326 06/15/23 1329 06/15/23 1742  BP: (!) 155/80 (!) 144/116 (!) 173/86 (!) 126/94  Pulse: 93 94  98  Resp:  20  20  Temp:  98.6 F (37 C)  99.2 F (37.3 C)  TempSrc:  Oral  Oral  SpO2: 97%  97% 98%  Weight:      Height:       CV--RRR Lung--CTA Abd--soft+BS/NT   Assessment/Plan: Delirium -spoke with patient's spouse and sons -Son plans to come be with patient and calm him down -Haldol  IM, 5 mg if remains belligerent, aggressive -sitter -UA no pyuria -TSH 3.498 -B12--277 (low normal)>>supplement -folate 17.2 -CXR--personally reviewed--no infiltrates -place new IV if patient allows once family is present     Demaris Fillers, DO [facility]

## 2023-06-15 NOTE — Evaluation (Signed)
 Clinical/Bedside Swallow Evaluation Patient Details  Name: George [name] MRN: [id number] Date of Birth: 23-Jul-1945  Today's Date: 06/15/2023 Time: SLP Start Time (ACUTE ONLY): 1340 SLP Stop Time (ACUTE ONLY): 1405 SLP Time Calculation (min) (ACUTE ONLY): 25 min  Past Medical History:  Past Medical History:  Diagnosis Date   Ascending aortic aneurysm (HCC)    4.1 cm in 2019   Chronic back pain    Coronary artery disease    Status post CABG 2014 (LIMA to LAD, SVG to ramus, SVG to OM 2)   Dermatophytosis of the body    Eczema    Essential hypertension    GERD (gastroesophageal reflux disease)    Herpes zoster without mention of complication    History of bronchitis    History of claustrophobia    HOH (hard of hearing)    Mixed hyperlipidemia    Prostate cancer (HCC)    Psoriasis and similar disorder    Sinusitis    Type 2 diabetes mellitus (HCC)    Past Surgical History:  Past Surgical History:  Procedure Laterality Date   APPLICATION OF WOUND VAC Left 07/23/2022   Procedure: APPLICATION OF WOUND VAC;  Surgeon: Shon Downing, MD;  Location: [facility] OR;  Service: Thoracic;  Laterality: Left;   APPLICATION OF WOUND VAC Left 07/26/2022   Procedure: APPLICATION OF WOUND VAC  CHANGE;  Surgeon: Shon Downing, MD;  Location: [facility] OR;  Service: Thoracic;  Laterality: Left;   CATARACT EXTRACTION W/PHACO Left 04/04/2014   Procedure: CATARACT EXTRACTION PHACO AND INTRAOCULAR LENS PLACEMENT (IOC);  Surgeon: Clay Cummins, MD;  Location: [facility];  Service: Ophthalmology;  Laterality: Left;  CDE:4.49   CATARACT EXTRACTION W/PHACO Right 01/02/2015   Procedure: CATARACT EXTRACTION PHACO AND INTRAOCULAR LENS PLACEMENT; CDE:  6.36;  Surgeon: Clay Cummins, MD;  Location: [facility];  Service: Ophthalmology;  Laterality: Right;   CIRCUMCISION  2010   CORONARY ARTERY BYPASS GRAFT N/A 08/13/2012   Procedure: CORONARY ARTERY BYPASS GRAFTING (CABG);  Surgeon: Heriberto London, MD;  Location: [facility] OR;  Service:  Open Heart Surgery;  Laterality: N/A;   ENDOVEIN HARVEST OF GREATER SAPHENOUS VEIN Right 08/13/2012   Procedure: ENDOVEIN HARVEST OF GREATER SAPHENOUS VEIN;  Surgeon: Heriberto London, MD;  Location: [facility] OR;  Service: Open Heart Surgery;  Laterality: Right;   INTRAOPERATIVE TRANSESOPHAGEAL ECHOCARDIOGRAM N/A 08/13/2012   Procedure: INTRAOPERATIVE TRANSESOPHAGEAL ECHOCARDIOGRAM;  Surgeon: Heriberto London, MD;  Location: [facility] OR;  Service: Open Heart Surgery;  Laterality: N/A;   LEFT HEART CATHETERIZATION WITH CORONARY ANGIOGRAM N/A 08/07/2012   Procedure: LEFT HEART CATHETERIZATION WITH CORONARY ANGIOGRAM;  Surgeon: Peter M Swaziland, MD;  Location: [facility] CATH LAB;  Service: Cardiovascular;  Laterality: N/A;   RADIOACTIVE SEED IMPLANT N/A 11/25/2017   Procedure: RADIOACTIVE SEED IMPLANT/BRACHYTHERAPY IMPLANT;  Surgeon: Homero Luster, MD;  Location: [place];  Service: Urology;  Laterality: N/A;   SEED IMPLANT DONE AT [place]  01/2017   STERNAL WOUND DEBRIDEMENT Left 07/23/2022   Procedure: LEFT STERNOCLAVICULAR JOINT DEBRIDEMENT;  Surgeon: Shon Downing, MD;  Location: [facility] OR;  Service: Thoracic;  Laterality: Left;   STERNAL WOUND DEBRIDEMENT Left 07/26/2022   Procedure: LEFT STERNOCLAVICULAR JOINT DEBRIDEMENT  WITH MYRIAD PLACEMENT;  Surgeon: Shon Downing, MD;  [facility];  Service: Thoracic;  Laterality: Left;   STERNAL WOUND DEBRIDEMENT Left 07/30/2022   Procedure: LEFT STERNOCLAVICULAR JOINT DEBRIDEMENT;  Surgeon: Shon Downing, MD;  Location: [facility];  Service: Thoracic;  Laterality: Left;   TEE  WITHOUT CARDIOVERSION N/A 06/12/2022   Procedure: TRANSESOPHAGEAL ECHOCARDIOGRAM;  Surgeon: Maudine Sos, MD;  Location: [facility] INVASIVE CV LAB;  Service: Cardiovascular;  Laterality: N/A;   WOUND EXPLORATION Left 09/26/2022   Procedure: DEBRIDEMENT LEFT STERNOCLAVICULAR JOINT;  Surgeon: Shon Downing, MD;  Location: [facility] OR;  Service: Thoracic;  Laterality: Left;   HPI:  George [name] is  a 78 year old male with 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   He denies facial droop, word finding difficulties, dysarthria, or other dysesthesia.  Interestingly, he states that he feels like he is having some difficulty swallowing and choking on liquids for the past 2 days. In SLP discussion with pt and family, this is not a new problem. Pt has intermittent coughing with liquids and globus with solids. In 2024 he was seen by SLP with similar complaint and CT neck showed bony protrusions on anterior cervical spine that could interfere with swallowing. Current CT negative for stroke, no MRI yet.    Assessment / Plan / Recommendation  Clinical Impression  Pt demonstrates no findings concerning for an acute dysphagia. Pt does report a little coughing occasionally with liquids, sometimes difficult solid foods (dry pork chop, conrbread) feel like they "stick." Family agrees that pt has to be attentive and careful. Does better when he takes his time and takes small sips. SLP observed one delayed cough after first sips. Otherwise tolerated consecutive swallows. SLP in 2024 observed similar findings and on CT neck pt also found to have cervical osteophytes that commonly impact swallows. Suspect this pt has a mild chronic dysphagia, but has low risk of detrimental aspiration. Pt may resume a regular diet and thin liquids while admitted. He may benefit from instrumental testing of swallowing to offer strategies or modifications to improve function. This could be done while admitted, or as an outpatient if pt is interested. Will have SLP f/u during admission to discuss further. Reported findings to pts sister with pts permission since he was busy with other providers. SLP Visit Diagnosis: Dysphagia, unspecified (R13.10)    Aspiration Risk  Mild aspiration risk    Diet  Recommendation Regular;Thin liquid    Liquid Administration via: Cup;Straw Medication Administration: Whole meds with liquid Supervision: Patient able to self feed Compensations: Slow rate;Small sips/bites;Follow solids with liquid Postural Changes: Seated upright at 90 degrees;Remain upright for at least 30 minutes after po intake    Other  Recommendations Oral Care Recommendations: Oral care BID    Recommendations for follow up therapy are one component of a multi-disciplinary discharge planning process, led by the attending physician.  Recommendations may be updated based on patient status, additional functional criteria and insurance authorization.  Follow up Recommendations Outpatient SLP      Assistance Recommended at Discharge    Functional Status Assessment Patient has had a recent decline in their functional status and demonstrates the ability to make significant improvements in function in a reasonable and predictable amount of time.  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   He denies facial droop, word finding difficulties, dysarthria, or other dysesthesia.  Interestingly, he states that he feels like he is having some difficulty swallowing and choking on liquids for the past 2 days. In SLP discussion with pt and family, this is not a new problem. Pt has intermittent coughing with liquids and globus with solids. In 2024 he was seen by SLP with similar complaint and CT neck showed bony protrusions on anterior cervical spine that could interfere with swallowing. Current CT negative for stroke, no MRI yet. Type of Study: Bedside Swallow  Evaluation Previous Swallow Assessment: see HPI Diet Prior to this Study: NPO Temperature Spikes Noted: No Respiratory Status: Room air History of Recent Intubation: No Behavior/Cognition: Alert;Cooperative;Pleasant mood Oral Cavity Assessment: Within Functional Limits Oral Care Completed by SLP: No Oral Cavity - Dentition: Poor condition;Missing dentition Vision: Functional for self-feeding Self-Feeding Abilities: Able to feed self Patient Positioning: Upright in bed Baseline Vocal Quality: Normal Volitional Cough: Strong Volitional Swallow: Able to elicit    Oral/Motor/Sensory Function Overall Oral Motor/Sensory Function: Within functional limits   Ice Chips     Thin Liquid Thin Liquid: Impaired Pharyngeal  Phase Impairments: Cough - Delayed    Nectar Thick Nectar Thick Liquid: Not tested   Honey Thick Honey Thick Liquid: Not tested   Puree Puree: Within functional limits Presentation: Spoon;Self Fed   Solid     Solid: Within functional limits     Noble Bateman, MA CCC-SLP  Acute Rehabilitation Services Secure Chat Preferred Office [phone]  Valeri Gate 06/15/2023,2:17 PM

## 2023-06-15 NOTE — Plan of Care (Signed)
  Problem: Activity: Goal: Risk for activity intolerance will decrease Outcome: Progressing   Problem: Safety: Goal: Ability to remain free from injury will improve Outcome: Progressing   

## 2023-06-15 NOTE — ED Notes (Signed)
 Pt  HR irregular. Sats picking up in the low 80s. 2L applied. Failed initial bedside swallow due to coughing. MD notified.

## 2023-06-15 NOTE — ED Provider Notes (Addendum)
 [place] EMERGENCY DEPARTMENT AT [facility] Provider Note   CSN: [id number] Arrival date & time: 06/15/23  4034     History  Chief Complaint  Patient presents with   Nausea    Pt brought in via POV for N/V after "starting a new medicine yesterday." Also present with generalized rash to lower abdomen.    George [name] is a 78 y.o. male.  HPI    78 year old male comes in with chief complaint of nausea, dizziness.  Patient has history of CAD status post CABG, thoracic aneurysm, diabetes.  He is accompanied by his granddaughter, who also provides history.  According the patient, he went to bed feeling well 2 nights ago.  Yesterday he woke up and felt nauseous, dizzy.  He went to [facility].  He was treated as a vertigo, given Antivert and discharged.  He however continues to feel unsteady, has nausea and therefore family made him come to the ER again.  Patient describes the dizziness as feeling lightheaded and unsteady he denies any spinning sensation.  He states that this morning he felt more unsteady than he does right now.  Review of system positive for nausea, some difficulty in swallowing.  Patient denies any new slurring of speech, vision loss.  He states yesterday he had some left-sided numbness as well.   Home Medications Prior to Admission medications   Medication Sig Start Date End Date Taking? Authorizing Provider  acetaminophen  (TYLENOL ) 500 MG tablet Take 1,000 mg by mouth every 6 (six) hours as needed for moderate pain.    [provider]  amLODipine  (NORVASC ) 10 MG tablet Take 10 mg by mouth in the morning.    [provider]  atorvastatin  (LIPITOR ) 80 MG tablet Take 1 tablet (80 mg total) by mouth daily. 06/14/22   Ozell Blunt, MD  carvedilol  (COREG ) 12.5 MG tablet TAKE 1 TABLET BY MOUTH TWICE DAILY 05/02/23   Gerard Knight, MD  furosemide  (LASIX ) 40 MG tablet Take 1 tablet (40 mg total) by mouth daily. 11/21/22   Lasalle Pointer, NP  hydrALAZINE  (APRESOLINE ) 25 MG tablet TAKE 1 TABLET BY MOUTH THREE TIMES DAILY 01/13/23   Shon Downing, MD  insulin  glargine (LANTUS ) 100 UNIT/ML Solostar Pen Inject 70 Units into the skin at bedtime. Take 40 units twice daily    [provider]  Multiple Vitamins-Minerals (PRESERVISION AREDS 2) CAPS Take 1 capsule by mouth 2 (two) times daily.    [provider]  nitroGLYCERIN  (NITROSTAT ) 0.4 MG SL tablet Place 1 tablet (0.4 mg total) under the tongue every 5 (five) minutes x 3 doses as needed for chest pain (if no relief after 3rd dose, proceed to ED for evaluation or call 911). 04/24/22   Gerard Knight, MD  NOVOLOG  FLEXPEN 100 UNIT/ML FlexPen INJECT 25-31 UNITS INTO THE SKIN THREE TIMES DAILY BEFORE MEALS Patient taking differently: Inject 50 Units into the skin in the morning, at noon, and at bedtime. 09/18/17   Nida, Gebreselassie W, MD  zinc gluconate 50 MG tablet Take 50 mg by mouth daily.    [provider]      Allergies    Patient has no known allergies.    Review of Systems   Review of Systems  All other systems reviewed and are negative.   Physical Exam Updated Vital Signs BP (!) 175/82   Pulse 93   Temp 98.1 F (36.7 C) (Oral)   Resp 11   Ht 6\' 3"  (  1.905 m)   Wt 99.8 kg   SpO2 97%   BMI 27.50 kg/m  Physical Exam Vitals and nursing note reviewed.  Constitutional:      Appearance: He is well-developed.  HENT:     Head: Normocephalic and atraumatic.  Eyes:     Extraocular Movements: Extraocular movements intact.     Pupils: Pupils are equal, round, and reactive to light.     Comments: No nystagmus, peripheral visual field intact  Neck:     Vascular: No JVD.  Cardiovascular:     Rate and Rhythm: Normal rate and regular rhythm.  Pulmonary:     Effort: Pulmonary effort is normal. No respiratory distress.     Breath sounds: Normal breath sounds. No wheezing.  Abdominal:     General: Bowel sounds are normal. There is  no distension.     Palpations: Abdomen is soft.     Tenderness: There is no abdominal tenderness.  Musculoskeletal:     Cervical back: Normal range of motion and neck supple.  Skin:    General: Skin is warm and dry.  Neurological:     Mental Status: He is alert and oriented to person, place, and time.     Cranial Nerves: No cranial nerve deficit.     Sensory: Sensory deficit present.     Motor: No weakness.     Coordination: Coordination normal.     Comments: Subtle left-sided facial numbness Subtle dysmetria with left upper extremity finger-to-nose No dysmetria with heel-to-shin Motor strength upper and lower extremity 4+ and equal No numbness or tingling to the upper and lower extremities bilaterally I ambulated the patient, he did not have any ataxia but had some balance issues when he was walking in straight line     ED Results / Procedures / Treatments   Labs (all labs ordered are listed, but only abnormal results are displayed) Labs Reviewed  URINALYSIS, W/ REFLEX TO CULTURE (INFECTION SUSPECTED) - Abnormal; Notable for the following components:      Result Value   Color, Urine STRAW (*)    All other components within normal limits  BASIC METABOLIC PANEL WITH GFR - Abnormal; Notable for the following components:   Chloride 97 (*)    Glucose, Bld 150 (*)    All other components within normal limits  MAGNESIUM   TSH    EKG EKG Interpretation Date/Time:  Sunday June 15 2023 09:15:39 EDT Ventricular Rate:  84 PR Interval:    QRS Duration:  145 QT Interval:  402 QTC Calculation: 476 R Axis:   -58  Text Interpretation: Atrial fibrillation Left bundle branch block No acute changes afib is new Confirmed by Deatra Face ([id number]) on 06/15/2023 11:24:50 AM  Radiology DG Chest Port 1 View Result Date: 06/15/2023 CLINICAL DATA:  Dizziness with nausea. EXAM: PORTABLE CHEST 1 VIEW COMPARISON:  09/26/2022 FINDINGS: The cardio pericardial silhouette is enlarged. The lungs are  clear without focal pneumonia, edema, pneumothorax or pleural effusion. No acute bony abnormality. Telemetry leads overlie the chest. IMPRESSION: Enlargement of the cardiopericardial silhouette without acute cardiopulmonary findings. Electronically Signed   By: Donnal Fusi M.D.   On: 06/15/2023 12:06   CT ANGIO HEAD NECK W WO CM Result Date: 06/15/2023 EXAM: CT HEAD WITHOUT CTA HEAD AND NECK WITH AND WITHOUT 06/15/2023 10:21:28 AM TECHNIQUE: CTA of the head and neck was performed with and without the administration of intravenous contrast. Noncontrast CT of the head with reconstructed 2-D images are also provided for review. Multiplanar 2D  and/or 3D reformatted images are provided for review. Automated exposure control, iterative reconstruction, and/or weight based adjustment of the mA/kV was utilized to reduce the radiation dose to as low as reasonably achievable. COMPARISON: 08/12/2018 CLINICAL HISTORY: Neuro deficit, acute, stroke suspected; ataxia, nausea. Started taking Meclizine yesterday and N/V started. Was dizzy all day yesterday and went to [facility] FINDINGS: CT HEAD: BRAIN AND VENTRICLES: No acute intracranial hemorrhage. No mass effect or midline shift. No extra-axial fluid collection. Gray-white differentiation is maintained. No hydrocephalus. Generalized volume loss within expected range for patient age. ORBITS: No acute abnormality. SINUSES: No acute abnormality. SOFT TISSUES AND SKULL: No acute abnormality. CTA NECK: AORTIC ARCH AND ARCH VESSELS: No dissection or arterial injury. No significant stenosis of the brachiocephalic or subclavian arteries. CERVICAL CAROTID ARTERIES: No dissection, arterial injury, or hemodynamically significant stenosis by NASCET criteria. Mild calcified plaque along the bilateral carotid bulbs without hemodynamically significant stenosis. CERVICAL VERTEBRAL ARTERIES: The right vertebral artery is patent to the basilar confluence without significant stenosis  or evidence of dissection. VISUALIZED LUNGS AND MEDIASTINUM: Unremarkable. SOFT TISSUES: No acute abnormality. BONES: Chronic-appearing deformity of the left clavicular head with widening of the left sternoclavicular joint. CTA HEAD: ANTERIOR CIRCULATION: No significant stenosis of the internal carotid arteries. No significant stenosis of the anterior cerebral arteries. No significant stenosis of the middle cerebral arteries. No aneurysm. Atherosclerotic calcifications of the carotid siphons without significant stenosis or aneurysm. POSTERIOR CIRCULATION: No significant stenosis of the posterior cerebral arteries. No significant stenosis of the basilar artery. Calcified plaque along the left V4 segment of the vertebral artery results in at least mild stenosis. No aneurysm. OTHER: No dural venous sinus thrombosis on this non-dedicated study. IMPRESSION: 1. No acute intracranial abnormality. 2. No large vessel occlusion, hemodynamically significant stenosis, or aneurysm in the head or neck. Electronically signed by: Audra Blend MD 06/15/2023 10:34 AM EDT RP Workstation: [id number]    Procedures Procedures    Medications Ordered in ED Medications  sodium chloride  0.9 % bolus 1,000 mL (0 mLs Intravenous Stopped 06/15/23 1109)  iohexol  (OMNIPAQUE ) 350 MG/ML injection 75 mL (75 mLs Intravenous Contrast Given 06/15/23 1013)    ED Course/ Medical Decision Making/ A&P                                 Medical Decision Making Amount and/or Complexity of Data Reviewed Labs: ordered. Radiology: ordered.  Risk Prescription drug management. Decision regarding hospitalization.   78 year old male history of diabetes, hypertension, CAD, thoracic artery aneurysm comes in with chief complaint of dizziness, nausea.  Review of system also positive for some dysphagia.  Patient denies any vertigo.  States that he just felt unsteady with ambulation.  Last known normal is 2 nights ago.  Differential diagnosis for  this patient includes ataxia due to stroke, severe electrolyte abnormality, dehydration, medication side effects.  I reviewed patient's records including Care Everywhere.  I have also discussed the case with his family member.  Per Care Everywhere, patient was seen at [facility] yesterday.  He was treated as vertigo, and it appears the lesions are better and was discharged.  Given that patient continues to have ataxia, has red flag symptoms such as nausea, some dysphagia and left-sided facial numbness, we wanted to proceed with MRI to rule out stroke.  However, patient indicates that he has severe claustrophobia and he will not be able to tolerate MRI.  In the past, medications were  attempted and it still failed.  Therefore, we will proceed with CT angiogram of the head and neck and reassess.  Reassessment at 10:30 AM CT-A reassuring. Nursing staff informed me that patient failed swallow evaluation. I will consult neurology at this time.   Reassessment 12:27 PM I consulted and discussed case with neurology, Dr. Bonnita Buttner. I discussed with him patient's complaint of unsteadiness, ataxia our exam finding of mild ataxia with ambulation, failed oral challenge/swallow evaluation and patient's inability to tolerate MRI.  Dr. Arora recommends that patient get admitted to the hospital for stroke workup.  He does not think MRI would change the management, and the risk for MRI under sedation does not outweigh the benefits.  He recommends repeat CT head tomorrow along with neurology consultation and PT/OT.  Patient can stay at [facility].  Nursing staff informed me that patient's O2 sats had dropped to 86%.  Patient was asleep at that time.  On my reassessment, patient is awake, O2 sats are normal.  He states that he does snore, but does not carry a diagnosis of sleep apnea. I will add chest x-ray to make sure there is no aspiration or pneumonitis type finding.  On EKG, it appears that patient  has A-fib.  A-fib is new for patient. I will defer additional new onset A-fib workup to admitting.  Patient could be having symptomatic A-fib and not stroke given that it appears that his dizziness intensity is waxing and waning.  Additionally, A-fib is a risk factor for stroke and patient could have had cryptogenic stroke.       Final Clinical Impression(s) / ED Diagnoses Final diagnoses:  Ataxia  Dysphagia, unspecified type  New onset a-fib [facility])    Rx / DC Orders ED Discharge Orders     None          Deatra Face, MD 06/15/23 1228

## 2023-06-15 NOTE — Evaluation (Incomplete)
 Clinical/Bedside Swallow Evaluation Patient Details  Name: George [name] MRN: [id number] Date of Birth: 05-Jun-1945  Today's Date: 06/15/2023 Time: SLP Start Time (ACUTE ONLY): 1340 SLP Stop Time (ACUTE ONLY): 1405 SLP Time Calculation (min) (ACUTE ONLY): 25 min  Past Medical History:  Past Medical History:  Diagnosis Date   Ascending aortic aneurysm (HCC)    4.1 cm in 2019   Chronic back pain    Coronary artery disease    Status post CABG 2014 (LIMA to LAD, SVG to ramus, SVG to OM 2)   Dermatophytosis of the body    Eczema    Essential hypertension    GERD (gastroesophageal reflux disease)    Herpes zoster without mention of complication    History of bronchitis    History of claustrophobia    HOH (hard of hearing)    Mixed hyperlipidemia    Prostate cancer (HCC)    Psoriasis and similar disorder    Sinusitis    Type 2 diabetes mellitus (HCC)    Past Surgical History:  Past Surgical History:  Procedure Laterality Date   APPLICATION OF WOUND VAC Left 07/23/2022   Procedure: APPLICATION OF WOUND VAC;  Surgeon: Shon Downing, MD;  Location: [facility] OR;  Service: Thoracic;  Laterality: Left;   APPLICATION OF WOUND VAC Left 07/26/2022   Procedure: APPLICATION OF WOUND VAC  CHANGE;  Surgeon: Shon Downing, MD;  Location: [facility] OR;  Service: Thoracic;  Laterality: Left;   CATARACT EXTRACTION W/PHACO Left 04/04/2014   Procedure: CATARACT EXTRACTION PHACO AND INTRAOCULAR LENS PLACEMENT (IOC);  Surgeon: Clay Cummins, MD;  Location: [facility] ORS;  Service: Ophthalmology;  Laterality: Left;  CDE:4.49   CATARACT EXTRACTION W/PHACO Right 01/02/2015   Procedure: CATARACT EXTRACTION PHACO AND INTRAOCULAR LENS PLACEMENT; CDE:  6.36;  Surgeon: Clay Cummins, MD;  Location: [facility] ORS;  Service: Ophthalmology;  Laterality: Right;   CIRCUMCISION  2010   CORONARY ARTERY BYPASS GRAFT N/A 08/13/2012   Procedure: CORONARY ARTERY BYPASS GRAFTING (CABG);  Surgeon: Heriberto London, MD;  Location: [facility] OR;  Service:  Open Heart Surgery;  Laterality: N/A;   ENDOVEIN HARVEST OF GREATER SAPHENOUS VEIN Right 08/13/2012   Procedure: ENDOVEIN HARVEST OF GREATER SAPHENOUS VEIN;  Surgeon: Heriberto London, MD;  Location[facility] OR;  Service: Open Heart Surgery;  Laterality: Right;   INTRAOPERATIVE TRANSESOPHAGEAL ECHOCARDIOGRAM N/A 08/13/2012   Procedure: INTRAOPERATIVE TRANSESOPHAGEAL ECHOCARDIOGRAM;  Surgeon: Heriberto London, MD;  Location: [facility] OR;  Service: Open Heart Surgery;  Laterality: N/A;   LEFT HEART CATHETERIZATION WITH CORONARY ANGIOGRAM N/A 08/07/2012   Procedure: LEFT HEART CATHETERIZATION WITH CORONARY ANGIOGRAM;  Surgeon: Peter M Swaziland, MD;  Location: [facility] CATH LAB;  Service: Cardiovascular;  Laterality: N/A;   RADIOACTIVE SEED IMPLANT N/A 11/25/2017   Procedure: RADIOACTIVE SEED IMPLANT/BRACHYTHERAPY IMPLANT;  Surgeon: Homero Luster, MD;  Location: [facility];  Service: Urology;  Laterality: N/A;   SEED IMPLANT DONE AT [place]  01/2017   STERNAL WOUND DEBRIDEMENT Left 07/23/2022   Procedure: LEFT STERNOCLAVICULAR JOINT DEBRIDEMENT;  Surgeon: Shon Downing, MD;  Location: [facility] OR;  Service: Thoracic;  Laterality: Left;   STERNAL WOUND DEBRIDEMENT Left 07/26/2022   Procedure: LEFT STERNOCLAVICULAR JOINT DEBRIDEMENT  WITH MYRIAD PLACEMENT;  Surgeon: Shon Downing, MD;  Location: [facility] OR;  Service: Thoracic;  Laterality: Left;   STERNAL WOUND DEBRIDEMENT Left 07/30/2022   Procedure: LEFT STERNOCLAVICULAR JOINT DEBRIDEMENT;  Surgeon: Shon Downing, MD;  Location: [facility] OR;  Service: Thoracic;  Laterality: Left;   TEE  WITHOUT CARDIOVERSION N/A 06/12/2022   Procedure: TRANSESOPHAGEAL ECHOCARDIOGRAM;  Surgeon: Maudine Sos, MD;  Location: [facility] INVASIVE CV LAB;  Service: Cardiovascular;  Laterality: N/A;   WOUND EXPLORATION Left 09/26/2022   Procedure: DEBRIDEMENT LEFT STERNOCLAVICULAR JOINT;  Surgeon: Shon Downing, MD;  Location: [facility] OR;  Service: Thoracic;  Laterality: Left;   HPI:  George [name] is  a 78 year old male with 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   He denies facial droop, word finding difficulties, dysarthria, or other dysesthesia.  Interestingly, he states that he feels like he is having some difficulty swallowing and choking on liquids for the past 2 days. In SLP discussion with pt and family, this is not a new problem. Pt has intermittent coughing with liquids and globus with solids. In 2024 he was seen by SLP with similar complaint and CT neck showed bony protrusions on anterior cervical spine that could interfere with swallowing. Current CT negative for stroke, no MRI yet.    Assessment / Plan / Recommendation  Clinical Impression  Pt demonstrates no findings concerning for an acute dysphagia. Pt does reports a little coughing occasionally with liquids, sometimes difficult solid foods (dry pork chop, conrbread) feel like they "stick." Family agrees that pt has to be attentive and careful. Does better when he takes his time and takes small sips. SLP observed one delayed cough after first sips. Otherwise tolerated consecutive swallows. SLP in 2024 observed similar findings and pt also found to have cervical osteohytes that commonly impact swallows. Suspect this pt has a mild chronic dysphagia, but has low risk of detrimental aspiration. Pt may resume a regular diet and thin liquids while admitted. He may benefit from instrumental testing of swallowing to offer strategies or modifications to improve function. This could be done while admitted, or as an outpatient if pt is interested. Will have SLP f/u during admission to discuss further. Reported concerns to pts sister with pts permission since he was busy with other providers. SLP Visit Diagnosis: Dysphagia, unspecified (R13.10)    Aspiration Risk  Mild aspiration risk    Diet Recommendation  Regular;Thin liquid    Liquid Administration via: Cup;Straw Medication Administration: Whole meds with liquid Supervision: Patient able to self feed Compensations: Slow rate;Small sips/bites;Follow solids with liquid Postural Changes: Seated upright at 90 degrees;Remain upright for at least 30 minutes after po intake    Other  Recommendations Oral Care Recommendations: Oral care BID    Recommendations for follow up therapy are one component of a multi-disciplinary discharge planning process, led by the attending physician.  Recommendations may be updated based on patient status, additional functional criteria and insurance authorization.  Follow up Recommendations Outpatient SLP      Assistance Recommended at Discharge    Functional Status Assessment Patient has had a recent decline in their functional status and demonstrates the ability to make significant improvements in function in a reasonable and predictable amount of time.  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   He denies facial droop, word finding difficulties, dysarthria, or other dysesthesia.  Interestingly, he  states that he feels like he is having some difficulty swallowing and choking on liquids for the past 2 days. In SLP discussion with pt and family, this is not a new problem. Pt has intermittent coughing with liquids and globus with solids. In 2024 he was seen by SLP with similar complaint and CT neck showed bony protrusions on anterior cervical spine that could interfere with swallowing. Current CT negative for stroke, no MRI yet. Type of Study: Bedside Swallow Evaluation Previous Swallow  Assessment: see HPI Diet Prior to this Study: NPO Temperature Spikes Noted: No Respiratory Status: Room air History of Recent Intubation: No Behavior/Cognition: Alert;Cooperative;Pleasant mood Oral Cavity Assessment: Within Functional Limits Oral Care Completed by SLP: No Oral Cavity - Dentition: Poor condition;Missing dentition Vision: Functional for self-feeding Self-Feeding Abilities: Able to feed self Patient Positioning: Upright in bed Baseline Vocal Quality: Normal Volitional Cough: Strong Volitional Swallow: Able to elicit    Oral/Motor/Sensory Function Overall Oral Motor/Sensory Function: Within functional limits   Ice Chips     Thin Liquid Thin Liquid: Impaired Pharyngeal  Phase Impairments: Cough - Delayed    Nectar Thick Nectar Thick Liquid: Not tested   Honey Thick Honey Thick Liquid: Not tested   Puree Puree: Within functional limits Presentation: Spoon;Self Fed   Solid     Solid: Within functional limits      Aarica Wax, Hardin Leys 06/15/2023,2:16 PM

## 2023-06-15 NOTE — Plan of Care (Signed)
  Problem: Education: Goal: Knowledge of disease or condition will improve Outcome: Progressing Goal: Knowledge of secondary prevention will improve (MUST DOCUMENT ALL) Outcome: Progressing Goal: Knowledge of patient specific risk factors will improve (DELETE if not current risk factor) Outcome: Progressing   Problem: Health Behavior/Discharge Planning: Goal: Ability to manage health-related needs will improve Outcome: Progressing Goal: Goals will be collaboratively established with patient/family Outcome: Progressing   Problem: Nutrition: Goal: Risk of aspiration will decrease Outcome: Progressing

## 2023-06-16 ENCOUNTER — Observation Stay (HOSPITAL_COMMUNITY)

## 2023-06-16 ENCOUNTER — Other Ambulatory Visit (HOSPITAL_COMMUNITY): Payer: Self-pay

## 2023-06-16 ENCOUNTER — Telehealth (HOSPITAL_COMMUNITY): Payer: Self-pay | Admitting: Pharmacy Technician

## 2023-06-16 DIAGNOSIS — E785 Hyperlipidemia, unspecified: Secondary | ICD-10-CM | POA: Diagnosis not present

## 2023-06-16 DIAGNOSIS — I7121 Aneurysm of the ascending aorta, without rupture: Secondary | ICD-10-CM

## 2023-06-16 DIAGNOSIS — I5031 Acute diastolic (congestive) heart failure: Secondary | ICD-10-CM | POA: Diagnosis not present

## 2023-06-16 DIAGNOSIS — R1312 Dysphagia, oropharyngeal phase: Secondary | ICD-10-CM

## 2023-06-16 DIAGNOSIS — I639 Cerebral infarction, unspecified: Secondary | ICD-10-CM | POA: Diagnosis not present

## 2023-06-16 DIAGNOSIS — R2990 Unspecified symptoms and signs involving the nervous system: Secondary | ICD-10-CM | POA: Diagnosis not present

## 2023-06-16 DIAGNOSIS — I480 Paroxysmal atrial fibrillation: Secondary | ICD-10-CM | POA: Diagnosis not present

## 2023-06-16 DIAGNOSIS — E1165 Type 2 diabetes mellitus with hyperglycemia: Secondary | ICD-10-CM | POA: Diagnosis not present

## 2023-06-16 DIAGNOSIS — I4891 Unspecified atrial fibrillation: Secondary | ICD-10-CM | POA: Diagnosis not present

## 2023-06-16 DIAGNOSIS — E782 Mixed hyperlipidemia: Secondary | ICD-10-CM | POA: Diagnosis not present

## 2023-06-16 DIAGNOSIS — I10 Essential (primary) hypertension: Secondary | ICD-10-CM | POA: Diagnosis not present

## 2023-06-16 LAB — RESPIRATORY PANEL BY PCR

## 2023-06-16 LAB — BASIC METABOLIC PANEL WITH GFR
Anion gap: 9 (ref 5–15)
BUN: 16 mg/dL (ref 8–23)
CO2: 25 mmol/L (ref 22–32)
Calcium: 8.5 mg/dL — ABNORMAL LOW (ref 8.9–10.3)
Chloride: 102 mmol/L (ref 98–111)
Creatinine, Ser: 0.82 mg/dL (ref 0.61–1.24)
GFR, Estimated: >60 mL/min (ref >60)
Glucose, Bld: 224 mg/dL — ABNORMAL HIGH (ref 70–99)
Potassium: 3.4 mmol/L — ABNORMAL LOW (ref 3.5–5.1)
Sodium: 136 mmol/L (ref 135–145)

## 2023-06-16 LAB — LIPID PANEL
Cholesterol: 116 mg/dL (ref 0–200)
HDL: 23 mg/dL — ABNORMAL LOW (ref >40)
LDL Cholesterol: 18 mg/dL (ref 0–99)
Total CHOL/HDL Ratio: 5 ratio
Triglycerides: 374 mg/dL — ABNORMAL HIGH (ref <150)
VLDL: 75 mg/dL — ABNORMAL HIGH (ref 0–40)

## 2023-06-16 LAB — RESP PANEL BY RT-PCR (RSV, FLU A&B, COVID)  RVPGX2
Influenza A by PCR: NEGATIVE
Influenza B by PCR: NEGATIVE
Resp Syncytial Virus by PCR: NEGATIVE
SARS Coronavirus 2 by RT PCR: NEGATIVE

## 2023-06-16 LAB — GLUCOSE, CAPILLARY
Glucose-Capillary: 266 mg/dL — ABNORMAL HIGH (ref 70–99)
Glucose-Capillary: 198 mg/dL — ABNORMAL HIGH (ref 70–99)

## 2023-06-16 LAB — PHOSPHORUS: Phosphorus: 2.9 mg/dL (ref 2.5–4.6)

## 2023-06-16 LAB — MAGNESIUM: Magnesium: 1.6 mg/dL — ABNORMAL LOW (ref 1.7–2.4)

## 2023-06-16 MED ORDER — MAGNESIUM SULFATE 2 GM/50ML IV SOLN
2.0000 g | Freq: Once | INTRAVENOUS | Status: AC
Start: 2023-06-16 — End: 2023-06-16
  Administered 2023-06-16: 2 g via INTRAVENOUS
  Filled 2023-06-16: qty 50

## 2023-06-16 MED ORDER — APIXABAN 5 MG PO TABS
5.0000 mg | ORAL_TABLET | Freq: Two times a day (BID) | ORAL | Status: DC
  Administered 2023-06-16: 5 mg via ORAL
  Filled 2023-06-16: qty 1

## 2023-06-16 MED ORDER — APIXABAN 5 MG PO TABS: 5.0000 mg | ORAL_TABLET | Freq: Two times a day (BID) | ORAL | 3 refills | Status: AC

## 2023-06-16 MED ORDER — POTASSIUM CHLORIDE CRYS ER 20 MEQ PO TBCR
40.0000 meq | EXTENDED_RELEASE_TABLET | Freq: Two times a day (BID) | ORAL | Status: DC
  Administered 2023-06-16: 40 meq via ORAL
  Filled 2023-06-16: qty 2

## 2023-06-16 MED ORDER — MECLIZINE HCL 12.5 MG PO TABS: 12.5000 mg | ORAL_TABLET | Freq: Three times a day (TID) | ORAL | 0 refills | Status: AC | PRN

## 2023-06-16 MED ORDER — HALOPERIDOL LACTATE 5 MG/ML IJ SOLN: 1.0000 mg | Freq: Four times a day (QID) | INTRAMUSCULAR | Status: DC | PRN

## 2023-06-16 MED ORDER — VITAMIN B-12 1000 MCG PO TABS: 1000.0000 ug | ORAL_TABLET | Freq: Every day | ORAL | 3 refills | Status: AC

## 2023-06-16 NOTE — Telephone Encounter (Signed)
 Patient Product/process development scientist completed.    The patient is insured through [employer]. Patient has Medicare and is not eligible for a copay card, but may be able to apply for patient assistance or Medicare RX Payment Plan (Patient Must reach out to their plan, if eligible for payment plan), if available.    Ran test claim for Eliquis 5 mg and the current 30 day co-pay is $420.84 due to a deductible.   This test claim was processed through [facility]- copay amounts may vary at other pharmacies due to pharmacy/plan contracts, or as the patient moves through the different stages of their insurance plan.     Morgan Arab, CPHT Pharmacy Technician III Certified Patient Advocate [facility] Patient Advocate Team Direct Number: [phone]  Fax: [phone]

## 2023-06-16 NOTE — Inpatient Diabetes Management (Signed)
 Inpatient Diabetes Program Recommendations  AACE/ADA: New Consensus Statement on Inpatient Glycemic Control (2015)  Target Ranges:  Prepandial:   less than 140 mg/dL      Peak postprandial:   less than 180 mg/dL (1-2 hours)      Critically ill patients:  140 - 180 mg/dL   Lab Results  Component Value Date   GLUCAP 198 (H) 06/16/2023   HGBA1C 7.8 (H) 06/15/2023    Review of Glycemic Control  Latest Reference Range & Units 06/15/23 16:37 06/15/23 21:05 06/16/23 07:42 06/16/23 11:18  Glucose-Capillary 70 - 99 mg/dL 161 (H) 096 (H) 045 (H) 198 (H)   Diabetes history: DM 2 Outpatient Diabetes medications: Lantus  40 units bid, Novolog  50 units tid Current orders for Inpatient glycemic control:  Semglee  20 units qhs Novolog  0-15 units tid + hs  A1c 7.8% on 6/1  Note: glucose trends increase after PO intake. On meal coverage insulin  at home.   Inpatient Diabetes Program Recommendations:    -   Novolog  4 units tid meal coverage if eating >50% of meals  Thanks, Eloise Hake RN, MSN, BC-ADM Inpatient Diabetes Coordinator Team Pager [phone] (8a-5p)

## 2023-06-16 NOTE — Progress Notes (Signed)
 Speech Language Pathology Treatment: Dysphagia  Patient Details Name: George [name] MRN: [id number] DOB: 05-Feb-1945 Today's Date: 06/16/2023 Time: 7829-5621 SLP Time Calculation (min) (ACUTE ONLY): 22 min  Assessment / Plan / Recommendation Clinical Impression  Pt seen for ongoing dysphagia diagnostic tx and education on recommendation of MBS. Pt lying in bed upon SLP arrival - he reported that he was feeling confused today; SLP alerted Nursing. Denied pain. Pt agreeable to PO trials to further assess for swallow strategies. Pt required min A to achieve upright position for PO. Nursing administered large potassium pills x2 during session. Pt noted to break it in half independently and take with thin liquids via straw. Observed to throw his head back slightly when swallowing. He consumed 25% of regular texture graham cracker with no difficult with mastication and denied globus sensation. He declined further trials. No overt s/sx of aspiration, oral residue, or difficulty observed across consistencies. SLP provided education on recommendation made 6/1 for MBSS and clinical reason for recommendation. Pt verbalized understanding of reason and stated that he is amenable to having an MBSS. When SLP asked whether he would prefer to have it while IP or OP, he stated he prefers to complete it OP. Rec continue reg/thin at this time with safe swallow strategies (upright with PO and for 30 minutes after, small bites/sips, slow rate, alternating liquids/solids. ST to sign off at this level of care. Rec MBSS in OP setting further assess oropharyngeal function given his c/o coughing with liquids and globus with solids.    HPI HPI: George [name] is a 78 year old male with 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   He denies facial droop, word finding difficulties, dysarthria, or  other dysesthesia.  Interestingly, he states that he feels like he is having some difficulty swallowing and choking on liquids for the past 2 days. In SLP discussion with pt and family, this is not a new problem. Pt has intermittent coughing with liquids and globus with solids. In 2024 he was seen by SLP with similar complaint and CT neck showed bony protrusions on anterior cervical spine that could interfere with swallowing. Current CT negative for stroke, no MRI yet.     SLP Plan  MBS;Discharge SLP treatment due to MBS in OP setting per patient preference      Recommendations for follow up therapy are one component of a multi-disciplinary discharge planning process, led by the attending physician.  Recommendations may be updated based on patient status, additional functional criteria and insurance authorization.    Recommendations  Diet recommendations: Regular;Thin liquid Liquids provided via: Straw;Cup Medication Administration: Whole meds with liquid Supervision: Patient able to self feed;Intermittent supervision to cue for compensatory strategies Compensations: Slow rate;Small sips/bites;Minimize environmental distractions Postural Changes and/or Swallow Maneuvers: Seated upright 90 degrees;Upright 30-60 min after meal                Dysphagia, unspecified (R13.10)     MBS;Discharge SLP treatment due to MBS in OP setting per patient preference     Caretha Chapel, MA CCC-SLP Speech-Language Pathologist 06/16/2023, 3:17 PM

## 2023-06-16 NOTE — Evaluation (Signed)
 Physical Therapy Evaluation Patient Details Name: JACHOB MCCLEAN MRN: [id number] DOB: Sep 13, 1945 Today's Date: 06/16/2023  History of Present Illness  KADARIOUS DIKES is a 78 year old male with 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  The patient states that he woke up on the morning of 06/14/2023 with the above symptoms.  He states that the symptoms are precipitated by sitting up or standing up and can last up to 30 minutes.  He denies loss of consciousness, but he did have an episode where he fell back onto the bed.  He has had some unsteady gait as a result of his dizziness.  He does not particularly feel like the room is spinning around.  The patient denies any alcohol  use or new medications.  He states that he did have some left upper extremity numbness and weakness that lasted about an hour on 06/14/2023.  He has not had recurrence of this episode since that.  He denies facial droop, word finding difficulties, dysarthria, or other dysesthesia.  Interestingly, he states that he feels like he is having some difficulty swallowing and choking on liquids for the past 2 days.  He denies any headache, visual disturbance, fevers, chills, chest pain, shortness of breath, coughing, hemoptysis.     Notably, granddaughter at the bedside states that there has been some viral respiratory symptoms going around at home.  The patient had nausea and vomiting intermittently for the last 2 days.  He also had some loose stools earlier this week which have resolved.  His oral intake has not been great this week.  He denies any frank fevers.  There is no abdominal pain, hematochezia, melena, dysuria.   Clinical Impression  Patient demonstrates good return for bed mobility, transfers, slightly labored movement during ambulation in room, hallway without loss of balance or need for an AD and  limited mostly due to c/o fatigue. PLAN:  Patient to be discharged home today and discharged from acute physical therapy to care of nursing for ambulation as tolerated for length of stay with recommendations stated below         If plan is discharge home, recommend the following: A little help with walking and/or transfers;A little help with bathing/dressing/bathroom;Help with stairs or ramp for entrance;Assistance with cooking/housework   Can travel by private vehicle        Equipment Recommendations None recommended by PT  Recommendations for Other Services       Functional Status Assessment Patient has had a recent decline in their functional status and demonstrates the ability to make significant improvements in function in a reasonable and predictable amount of time.     Precautions / Restrictions Precautions Precautions: Fall Recall of Precautions/Restrictions: Intact Restrictions Weight Bearing Restrictions Per Provider Order: No      Mobility  Bed Mobility Overal bed mobility: Independent                  Transfers Overall transfer level: Needs assistance   Transfers: Sit to/from Stand, Bed to chair/wheelchair/BSC Sit to Stand: Supervision   Step pivot transfers: Supervision       General transfer comment: labored movement, slightly increased time    Ambulation/Gait Ambulation/Gait assistance: Supervision Gait Distance (Feet): 85 Feet Assistive device: None Gait Pattern/deviations: Decreased step length - right, Decreased stride length, Decreased step length - left Gait velocity: decreased     General Gait Details: slightly labored  unsteady movement without loss of balance or need for an AD, limited mostly due to c/o fatigue  Stairs            Wheelchair Mobility     Tilt Bed    Modified Rankin (Stroke Patients Only)       Balance Overall balance assessment: Mild deficits observed, not formally tested                                            Pertinent Vitals/Pain Pain Assessment Pain Assessment: No/denies pain    Home Living Family/patient expects to be discharged to:: Private residence Living Arrangements: Spouse/significant other Available Help at Discharge: Family Type of Home: House Home Access: Stairs to enter   Secretary/administrator of Steps: 2   Home Layout: One level Home Equipment: Shower seat;Grab bars - tub/shower;Toilet riser      Prior Function Prior Level of Function : Independent/Modified Independent;Driving             Mobility Comments: Community ambulation without AD, drives during the day, does not drive at night due to poor vision ADLs Comments: Independent     Extremity/Trunk Assessment   Upper Extremity Assessment Upper Extremity Assessment: Defer to OT evaluation    Lower Extremity Assessment Lower Extremity Assessment: Generalized weakness    Cervical / Trunk Assessment Cervical / Trunk Assessment: Normal  Communication   Communication Communication: No apparent difficulties    Cognition Arousal: Lethargic Behavior During Therapy: WFL for tasks assessed/performed   PT - Cognitive impairments: No apparent impairments                         Following commands: Intact       Cueing Cueing Techniques: Verbal cues     General Comments      Exercises     Assessment/Plan    PT Assessment All further PT needs can be met in the next venue of care  PT Problem List Decreased strength;Decreased activity tolerance;Decreased balance;Decreased mobility       PT Treatment Interventions      PT Goals (Current goals can be found in the Care Plan section)  Acute Rehab PT Goals Patient Stated Goal: return home with family to assist PT Goal Formulation: With patient/family Time For Goal Achievement: 06/16/23 Potential to Achieve Goals: Good    Frequency       Co-evaluation PT/OT/SLP Co-Evaluation/Treatment: Yes Reason for  Co-Treatment: To address functional/ADL transfers PT goals addressed during session: Mobility/safety with mobility;Balance         AM-PAC PT "6 Clicks" Mobility  Outcome Measure Help needed turning from your back to your side while in a flat bed without using bedrails?: None Help needed moving from lying on your back to sitting on the side of a flat bed without using bedrails?: None Help needed moving to and from a bed to a chair (including a wheelchair)?: A Little Help needed standing up from a chair using your arms (e.g., wheelchair or bedside chair)?: A Little Help needed to walk in hospital room?: A Little Help needed climbing 3-5 steps with a railing? : A Little 6 Click Score: 20    End of Session Equipment Utilized During Treatment: Gait belt Activity Tolerance: Patient tolerated treatment well;Patient limited by fatigue Patient left: in bed;with call bell/phone within reach Nurse Communication: Mobility status PT Visit  Diagnosis: Unsteadiness on feet (R26.81);Other abnormalities of gait and mobility (R26.89);Muscle weakness (generalized) (M62.81)    Time: 1610-9604 PT Time Calculation (min) (ACUTE ONLY): 28 min   Charges:   PT Evaluation $PT Eval Moderate Complexity: 1 Mod PT Treatments $Therapeutic Activity: 23-37 mins PT General Charges $$ ACUTE PT VISIT: 1 Visit         1:51 PM, 06/16/23 Walton Guppy, MPT Physical Therapist with [facility] [address] [phone] office [phone] mobile phone

## 2023-06-16 NOTE — Care Management Obs Status (Signed)
 MEDICARE OBSERVATION STATUS NOTIFICATION   Patient Details  Name: George [name] MRN: [id number] Date of Birth: [birth year]   Medicare Observation Status Notification Given:  Yes    Geraldina Klinefelter, RN 06/16/2023, 11:51 AM

## 2023-06-16 NOTE — Discharge Instructions (Signed)
Information on my medicine - ELIQUIS® (apixaban) ° °This medication education was reviewed with me or my healthcare representative as part of my discharge preparation.  The pharmacist that spoke with me during my hospital stay was:  Jannel Lynne C Antwan Pandya, RPH ° °Why was Eliquis® prescribed for you? °Eliquis® was prescribed for you to reduce the risk of a blood clot forming that can cause a stroke if you have a medical condition called atrial fibrillation (a type of irregular heartbeat). ° °What do You need to know about Eliquis® ? °Take your Eliquis® TWICE DAILY - one tablet in the morning and one tablet in the evening with or without food. If you have difficulty swallowing the tablet whole please discuss with your pharmacist how to take the medication safely. ° °Take Eliquis® exactly as prescribed by your doctor and DO NOT stop taking Eliquis® without talking to the doctor who prescribed the medication.  Stopping may increase your risk of developing a stroke.  Refill your prescription before you run out. ° °After discharge, you should have regular check-up appointments with your healthcare provider that is prescribing your Eliquis®.  In the future your dose may need to be changed if your kidney function or weight changes by a significant amount or as you get older. ° °What do you do if you miss a dose? °If you miss a dose, take it as soon as you remember on the same day and resume taking twice daily.  Do not take more than one dose of ELIQUIS at the same time to make up a missed dose. ° °Important Safety Information °A possible side effect of Eliquis® is bleeding. You should call your healthcare provider right away if you experience any of the following: °? Bleeding from an injury or your nose that does not stop. °? Unusual colored urine (red or dark brown) or unusual colored stools (red or black). °? Unusual bruising for unknown reasons. °? A serious fall or if you hit your head (even if there is no bleeding). ° °Some  medicines may interact with Eliquis® and might increase your risk of bleeding or clotting while on Eliquis®. To help avoid this, consult your healthcare provider or pharmacist prior to using any new prescription or non-prescription medications, including herbals, vitamins, non-steroidal anti-inflammatory drugs (NSAIDs) and supplements. ° °This website has more information on Eliquis® (apixaban): http://www.eliquis.com/eliquis/home °

## 2023-06-16 NOTE — Discharge Summary (Signed)
 Physician Discharge Summary   Patient: George [name] MRN: [id number] DOB: 28-Aug-1945  Admit date:     06/15/2023  Discharge date: 06/16/23  Discharge Physician: Justina Oman   PCP: Theoplis Fix, MD   Recommendations at discharge:  Repeat CBC to follow hemoglobin trend/stability Repeat basic metabolic panel to follow ultralights renal function Follow CBG fluctuation and Continue adjusting hypoglycemic regimen as needed. Reassess blood pressure and adjust antihypertensive treatment as required. Make sure patient follow-up with neurology and cardiology service as instructed.  Discharge Diagnoses: Principal Problem:   Stroke-like symptom Active Problems:   Uncontrolled type 2 diabetes mellitus with hyperglycemia (HCC)   Mixed hyperlipidemia   Essential hypertension, benign   Dysphagia   Paroxysmal atrial fibrillation [facility])  Brief Hospital admission narrative: As per H&P written by Dr. Winferd Hatter on 06/15/2023 George [name] is a 78 year old male with 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  The patient states that he woke up on the morning of 06/14/2023 with the above symptoms.  He states that the symptoms are precipitated by sitting up or standing up and can last up to 30 minutes.  He denies loss of consciousness, but he did have an episode where he fell back onto the bed.  He has had some unsteady gait as a result of his dizziness.  He does not particularly feel like the room is spinning around.  The patient denies any alcohol  use or new medications.  He states that he did have some left upper extremity numbness and weakness that lasted about an hour on 06/14/2023.  He has not had recurrence of this episode since that.  He denies facial droop, word finding difficulties, dysarthria, or other dysesthesia.  Interestingly, he states that he feels like he is having  some difficulty swallowing and choking on liquids for the past 2 days.  He denies any headache, visual disturbance, fevers, chills, chest pain, shortness of breath, coughing, hemoptysis.   Notably, granddaughter at the bedside states that there has been some viral respiratory symptoms going around at home.  The patient had nausea and vomiting intermittently for the last 2 days.  He also had some loose stools earlier this week which have resolved.  His oral intake has not been great this week.  He denies any frank fevers.  There is no abdominal pain, hematochezia, melena, dysuria.   He went to [facility] ED on 06/14/2023.  CT of the brain was negative.  The patient was sent home with meclizine.  His BMP was unremarkable with serum creatinine 0.92 but showed potassium 3.3 on 06/14/2023.  CBC showed WBC 7.0, hemoglobin 13.1, platelets 177.   In the ED, the patient was afebrile hemodynamically stable with oxygen saturation 94% room air.  When he fell asleep, his oxygen saturation went down to 86%. BMP showed sodium 136, potassium 3.6, bicarbonate 28, serum creatinine 0.88.  Magnesium  1.8.  Assessment and Plan: 1-strokelike symptoms - Patient presenting with dizziness and sensory disturbance/left hemiparesis - CT scan of the head negative for acute abnormalities; CT angio head and neck without large vessel occlusion abnormalities on repeat CT scan of the head 36 hours apart demonstrating no acute hemorrhagic or ischemic strokes. -PAF capture while inpatient -2-echo with preserved EF, no significant valvular disorder and no presence of blood clot.  Positive severe left ventricular hypertrophy -after discussing with neurology service will discharge on apixaban BID. -per PT evaluation will  discharge home with [facility] services.  2-new onset A. Fib -appreciate assistance and rec's by cardiology service -echo as mentioned above -continue anticoagulation and follow up with cardiology service as an  outpatient.  3-hyperlipidemia - Continue statin.  4-essential hypertension - Resume home antihypertensive agents - Follow-up blood pressure and adjust medications as needed with intention/goal to maintain blood pressure less than 130 systolic.  5-chronic systolic heart failure -Condition overall stable; patient euvolemic. - Repeat echo demonstrating stabilization of patient EF - Continue current medications/diuretics and outpatient follow-up with cardiology service - Low-sodium diet, daily weights and adequate hydration discussed with patient.  6-type 2 diabetes mellitus - Resume home hypoglycemic regimen - Goal for A1c less than 7.  Consultants: Cardiology and neurology service. Procedures performed: See below for x-ray reports. Disposition: Home health Diet recommendation: Heart healthy modified carbohydrate diet.  DISCHARGE MEDICATION: Allergies as of 06/16/2023   No Known Allergies      Medication List     STOP taking these medications    hydrALAZINE  25 MG tablet Commonly known as: APRESOLINE    sulfamethoxazole-trimethoprim 800-160 MG tablet Commonly known as: BACTRIM DS       TAKE these medications    acetaminophen  500 MG tablet Commonly known as: TYLENOL  Take 1,000 mg by mouth every 6 (six) hours as needed for moderate pain.   amLODipine  10 MG tablet Commonly known as: NORVASC  Take 10 mg by mouth in the morning.   apixaban 5 MG Tabs tablet Commonly known as: ELIQUIS Take 1 tablet (5 mg total) by mouth 2 (two) times daily.   atorvastatin  80 MG tablet Commonly known as: LIPITOR  Take 1 tablet (80 mg total) by mouth daily.   carvedilol  12.5 MG tablet Commonly known as: COREG  TAKE 1 TABLET BY MOUTH TWICE DAILY   clotrimazole-betamethasone cream Commonly known as: LOTRISONE Apply 1 Application topically 2 (two) times daily.   cyanocobalamin 1000 MCG tablet Commonly known as: VITAMIN B12 Take 1 tablet (1,000 mcg total) by mouth daily. Start taking  on: June 17, 2023   furosemide  40 MG tablet Commonly known as: LASIX  Take 1 tablet (40 mg total) by mouth daily.   insulin  glargine 100 UNIT/ML Solostar Pen Commonly known as: LANTUS  Inject 40 Units into the skin 2 (two) times daily.   lisinopril  2.5 MG tablet Commonly known as: ZESTRIL  Take 2.5 mg by mouth daily.   meclizine 12.5 MG tablet Commonly known as: ANTIVERT Take 1 tablet (12.5 mg total) by mouth 3 (three) times daily as needed for dizziness.   nitroGLYCERIN  0.4 MG SL tablet Commonly known as: NITROSTAT  Place 1 tablet (0.4 mg total) under the tongue every 5 (five) minutes x 3 doses as needed for chest pain (if no relief after 3rd dose, proceed to ED for evaluation or call 911).   NovoLOG  FlexPen 100 UNIT/ML FlexPen Generic drug: insulin  aspart INJECT 25-31 UNITS INTO THE SKIN THREE TIMES DAILY BEFORE MEALS What changed: See the new instructions.   PreserVision AREDS 2 Caps Take 1 capsule by mouth 2 (two) times daily.   zinc gluconate 50 MG tablet Take 50 mg by mouth daily.        Follow-up Information     Theoplis Fix, MD. Schedule an appointment as soon as possible for a visit in 10 day(s).   Specialty: Internal Medicine Contact information: [address]  [place] [address] [phone]                Discharge Exam: George [name] Weights   06/15/23 [phone]  Weight: 99.8 kg   General exam: Alert, awake, able to follow simple commands and in no major distress. Respiratory system: Clear to auscultation. Respiratory effort normal.  Good saturation on room air. Cardiovascular system: Rate controlled, no rubs, no gallops, no JVD on exam. Gastrointestinal system: Abdomen is nondistended, soft and nontender. No organomegaly or masses felt. Normal bowel sounds heard. Central nervous system: 4 limbs spontaneously; chills present feeling slightly off balance.  No focal neurological deficits. Extremities: No cyanosis or clubbing. Skin: No petechiae. Psychiatry:  Mood & affect appropriate and is stable at discharge.  Condition at discharge: stable and improved.  The results of significant diagnostics from this hospitalization (including imaging, microbiology, ancillary and laboratory) are listed below for reference.   Imaging Studies: CT HEAD WO CONTRAST ( ) Result Date: 06/16/2023 CLINICAL DATA:  78 year old male with altered mental status, confusion, agitation. EXAM: CT HEAD WITHOUT CONTRAST TECHNIQUE: Contiguous axial images were obtained from the base of the skull through the vertex without intravenous contrast. RADIATION DOSE REDUCTION: This exam was performed according to the departmental dose-optimization program which includes automated exposure control, adjustment of the mA and/or kV according to patient size and/or use of iterative reconstruction technique. COMPARISON:  Head CT 06/15/2023. FINDINGS: Brain: Cerebral volume is normal for age. No midline shift, ventriculomegaly, mass effect, evidence of mass lesion, intracranial hemorrhage or evidence of cortically based acute infarction. Gray-white matter differentiation appears stable and normal for age. Vascular: Calcified atherosclerosis at the skull base. No suspicious intracranial vascular hyperdensity. Skull: Intact.  No acute osseous abnormality identified. Sinuses/Orbits: Visualized paranasal sinuses and mastoids are stable and well aerated. Other: No acute orbit or scalp soft tissue finding. IMPRESSION: Stable and normal for age noncontrast CT appearance of the brain. Electronically Signed   By: Marlise Simpers M.D.   On: 06/16/2023 09:23   ECHOCARDIOGRAM COMPLETE Result Date: 06/15/2023    ECHOCARDIOGRAM REPORT   Patient Name:   JAYSIN GAYLER Date of Exam: 06/15/2023 Medical Rec #:  [id number]     Height:       75.0 in Accession #:    [id number]    Weight:       220.0 lb Date of Birth:  [birth year]     BSA:          2.285 m Patient Age:    30 years      BP:           155/80 mmHg Patient Gender: M              HR:           85 bpm. Exam Location:  [facility] Procedure: 2D Echo, Color Doppler, Cardiac Doppler and Intracardiac            Opacification Agent (Both Spectral and Color Flow Doppler were            utilized during procedure). Indications:    Stroke  History:        Patient has prior history of Echocardiogram examinations, most                 recent 06/12/2022. CAD and Previous Myocardial Infarction; Risk                 Factors:Former Smoker, Diabetes and Dyslipidemia. CABG x 3.  Sonographer:    Gelene Kelly RDCS Referring Phys: [phone] DAVID TAT IMPRESSIONS  1. Left ventricular ejection fraction, by estimation, is 55 to 60%. The left ventricle has normal function. Left ventricular  endocardial border not optimally defined to evaluate regional wall motion. There is severe left ventricular hypertrophy. Left ventricular diastolic parameters are indeterminate.  2. Right ventricular systolic function is normal. The right ventricular size is normal. Tricuspid regurgitation signal is inadequate for assessing PA pressure.  3. Left atrial size was mildly dilated.  4. The mitral valve is normal in structure. No evidence of mitral valve regurgitation. No evidence of mitral stenosis.  5. The aortic valve is tricuspid. Aortic valve regurgitation is not visualized. No aortic stenosis is present.  6. Aortic dilatation noted. There is mild dilatation of the aortic root, measuring 42 mm. There is mild dilatation of the ascending aorta, measuring 38 mm. FINDINGS  Left Ventricle: Left ventricular ejection fraction, by estimation, is 55 to 60%. The left ventricle has normal function. Left ventricular endocardial border not optimally defined to evaluate regional wall motion. Definity  contrast agent was given IV to delineate the left ventricular endocardial borders. The left ventricular internal cavity size was normal in size. There is severe left ventricular hypertrophy. Left ventricular diastolic parameters are indeterminate. Right  Ventricle: The right ventricular size is normal. Right vetricular wall thickness was not well visualized. Right ventricular systolic function is normal. Tricuspid regurgitation signal is inadequate for assessing PA pressure. Left Atrium: Left atrial size was mildly dilated. Right Atrium: Right atrial size was normal in size. Pericardium: There is no evidence of pericardial effusion. Mitral Valve: The mitral valve is normal in structure. No evidence of mitral valve regurgitation. No evidence of mitral valve stenosis. Tricuspid Valve: The tricuspid valve is normal in structure. Tricuspid valve regurgitation is not demonstrated. No evidence of tricuspid stenosis. Aortic Valve: The aortic valve is tricuspid. Aortic valve regurgitation is not visualized. No aortic stenosis is present. Aortic valve mean gradient measures 2.9 mmHg. Aortic valve peak gradient measures 4.8 mmHg. Aortic valve area, by VTI measures 3.73 cm. Pulmonic Valve: The pulmonic valve was not well visualized. Pulmonic valve regurgitation is not visualized. No evidence of pulmonic stenosis. Aorta: Aortic dilatation noted. There is mild dilatation of the aortic root, measuring 42 mm. There is mild dilatation of the ascending aorta, measuring 38 mm. Venous: IVC not well visualized. IAS/Shunts: The interatrial septum was not well visualized.  LEFT VENTRICLE PLAX 2D LVIDd:         5.40 cm   Diastology LVIDs:         3.70 cm   LV e' medial:    7.40 cm/s LV PW:         1.50 cm   LV E/e' medial:  6.1 LV IVS:        1.50 cm   LV e' lateral:   12.20 cm/s LVOT diam:     2.10 cm   LV E/e' lateral: 3.7 LV SV:         70 LV SV Index:   31 LVOT Area:     3.46 cm  RIGHT VENTRICLE RV S prime:     10.30 cm/s TAPSE (M-mode): 1.1 cm LEFT ATRIUM              Index        RIGHT ATRIUM           Index LA diam:        4.90 cm  2.14 cm/m   RA Area:     16.50 cm LA Vol (A2C):   113.0 ml 49.45 ml/m  RA Volume:   46.30 ml  20.26 ml/m LA Vol (A4C):  71.0 ml  31.07 ml/m LA  Biplane Vol: 91.8 ml  40.17 ml/m  AORTIC VALVE AV Area (Vmax):    3.54 cm AV Area (Vmean):   3.08 cm AV Area (VTI):     3.73 cm AV Vmax:           109.68 cm/s AV Vmean:          80.228 cm/s AV VTI:            0.189 m AV Peak Grad:      4.8 mmHg AV Mean Grad:      2.9 mmHg LVOT Vmax:         112.00 cm/s LVOT Vmean:        71.300 cm/s LVOT VTI:          0.203 m LVOT/AV VTI ratio: 1.08  AORTA Ao Root diam: 4.20 cm Ao Asc diam:  3.80 cm MITRAL VALVE MV Area (PHT): 3.50 cm    SHUNTS MV Decel Time: 217 msec    Systemic VTI:  0.20 m MV E velocity: 45.30 cm/s  Systemic Diam: 2.10 cm MV A velocity: 69.00 cm/s MV E/A ratio:  0.66 Armida Lander MD Electronically signed by Armida Lander MD Signature Date/Time: 06/15/2023/4:47:00 PM    Final    DG Chest Port 1 View Result Date: 06/15/2023 CLINICAL DATA:  Dizziness with nausea. EXAM: PORTABLE CHEST 1 VIEW COMPARISON:  09/26/2022 FINDINGS: The cardio pericardial silhouette is enlarged. The lungs are clear without focal pneumonia, edema, pneumothorax or pleural effusion. No acute bony abnormality. Telemetry leads overlie the chest. IMPRESSION: Enlargement of the cardiopericardial silhouette without acute cardiopulmonary findings. Electronically Signed   By: Donnal Fusi M.D.   On: 06/15/2023 12:06   CT ANGIO HEAD NECK W WO CM Result Date: 06/15/2023 EXAM: CT HEAD WITHOUT CTA HEAD AND NECK WITH AND WITHOUT 06/15/2023 10:21:28 AM TECHNIQUE: CTA of the head and neck was performed with and without the administration of intravenous contrast. Noncontrast CT of the head with reconstructed 2-D images are also provided for review. Multiplanar 2D and/or 3D reformatted images are provided for review. Automated exposure control, iterative reconstruction, and/or weight based adjustment of the mA/kV was utilized to reduce the radiation dose to as low as reasonably achievable. COMPARISON: 08/12/2018 CLINICAL HISTORY: Neuro deficit, acute, stroke suspected; ataxia, nausea. Started  taking Meclizine yesterday and N/V started. Was dizzy all day yesterday and went to [facility]. FINDINGS: CT HEAD: BRAIN AND VENTRICLES: No acute intracranial hemorrhage. No mass effect or midline shift. No extra-axial fluid collection. Gray-white differentiation is maintained. No hydrocephalus. Generalized volume loss within expected range for patient age. ORBITS: No acute abnormality. SINUSES: No acute abnormality. SOFT TISSUES AND SKULL: No acute abnormality. CTA NECK: AORTIC ARCH AND ARCH VESSELS: No dissection or arterial injury. No significant stenosis of the brachiocephalic or subclavian arteries. CERVICAL CAROTID ARTERIES: No dissection, arterial injury, or hemodynamically significant stenosis by NASCET criteria. Mild calcified plaque along the bilateral carotid bulbs without hemodynamically significant stenosis. CERVICAL VERTEBRAL ARTERIES: The right vertebral artery is patent to the basilar confluence without significant stenosis or evidence of dissection. VISUALIZED LUNGS AND MEDIASTINUM: Unremarkable. SOFT TISSUES: No acute abnormality. BONES: Chronic-appearing deformity of the left clavicular head with widening of the left sternoclavicular joint. CTA HEAD: ANTERIOR CIRCULATION: No significant stenosis of the internal carotid arteries. No significant stenosis of the anterior cerebral arteries. No significant stenosis of the middle cerebral arteries. No aneurysm. Atherosclerotic calcifications of the carotid siphons without significant stenosis or aneurysm. POSTERIOR CIRCULATION:  No significant stenosis of the posterior cerebral arteries. No significant stenosis of the basilar artery. Calcified plaque along the left V4 segment of the vertebral artery results in at least mild stenosis. No aneurysm. OTHER: No dural venous sinus thrombosis on this non-dedicated study. IMPRESSION: 1. No acute intracranial abnormality. 2. No large vessel occlusion, hemodynamically significant stenosis, or aneurysm in  the head or neck. Electronically signed by: Audra Blend MD 06/15/2023 10:34 AM EDT RP Workstation: [id number]    Microbiology: Results for orders placed or performed during the hospital encounter of 06/15/23  Resp panel by RT-PCR (RSV, Flu A&B, Covid) Anterior Nasal Swab     Status: None   Collection Time: 06/16/23  9:37 AM   Specimen: Anterior Nasal Swab  Result Value Ref Range Status   SARS Coronavirus 2 by RT PCR NEGATIVE NEGATIVE Final    Comment: (NOTE) SARS-CoV-2 target nucleic acids are NOT DETECTED.  The SARS-CoV-2 RNA is generally detectable in upper respiratory specimens during the acute phase of infection. The lowest concentration of SARS-CoV-2 viral copies this assay can detect is 138 copies/mL. A negative result does not preclude SARS-Cov-2 infection and should not be used as the sole basis for treatment or other patient management decisions. A negative result may occur with  improper specimen collection/handling, submission of specimen other than nasopharyngeal swab, presence of viral mutation(s) within the areas targeted by this assay, and inadequate number of viral copies(<138 copies/mL). A negative result must be combined with clinical observations, patient history, and epidemiological information. The expected result is Negative.  Fact Sheet for Patients:  BloggerCourse.com  Fact Sheet for Healthcare Providers:  SeriousBroker.it  This test is no t yet approved or cleared by the [place]  FDA and  has been authorized for detection and/or diagnosis of SARS-CoV-2 by FDA under an Emergency Use Authorization (EUA). This EUA will remain  in effect (meaning this test can be used) for the duration of the COVID-19 declaration under Section 564(b)(1) of the Act, 21 U.S.C.section 360bbb-3(b)(1), unless the authorization is terminated  or revoked sooner.       Influenza A by PCR NEGATIVE NEGATIVE Final    Influenza B by PCR NEGATIVE NEGATIVE Final    Comment: (NOTE) The Xpert Xpress SARS-CoV-2/FLU/RSV plus assay is intended as an aid in the diagnosis of influenza from Nasopharyngeal swab specimens and should not be used as a sole basis for treatment. Nasal washings and aspirates are unacceptable for Xpert Xpress SARS-CoV-2/FLU/RSV testing.  Fact Sheet for Patients: BloggerCourse.com  Fact Sheet for Healthcare Providers: SeriousBroker.it  This test is not yet approved or cleared by the [place]  FDA and has been authorized for detection and/or diagnosis of SARS-CoV-2 by FDA under an Emergency Use Authorization (EUA). This EUA will remain in effect (meaning this test can be used) for the duration of the COVID-19 declaration under Section 564(b)(1) of the Act, 21 U.S.C. section 360bbb-3(b)(1), unless the authorization is terminated or revoked.     Resp Syncytial Virus by PCR NEGATIVE NEGATIVE Final    Comment: (NOTE) Fact Sheet for Patients: BloggerCourse.com  Fact Sheet for Healthcare Providers: SeriousBroker.it  This test is not yet approved or cleared by the [place]  FDA and has been authorized for detection and/or diagnosis of SARS-CoV-2 by FDA under an Emergency Use Authorization (EUA). This EUA will remain in effect (meaning this test can be used) for the duration of the COVID-19 declaration under Section 564(b)(1) of the Act, 21 U.S.C. section 360bbb-3(b)(1), unless the authorization is terminated or revoked.  Performed at [facility], [address], [place], [place] [address]     Labs: CBC: Recent Labs  Lab 06/15/23 1448  WBC 7.6  HGB 12.3*  HCT 37.7*  MCV 84.7  PLT 190   Basic Metabolic Panel: Recent Labs  Lab 06/15/23 1128 06/16/23 0239  NA 136 136  K 3.6 3.4*  CL 97* 102  CO2 28 25  GLUCOSE 150* 224*  BUN 23 16  CREATININE 0.88 0.82   CALCIUM  9.1 8.5*  MG 1.8 1.6*  PHOS  --  2.9   Liver Function Tests: No results for input(s): "AST", "ALT", "ALKPHOS", "BILITOT", "PROT", "ALBUMIN " in the last 168 hours. CBG: Recent Labs  Lab 06/15/23 1637 06/15/23 2105 06/16/23 0742 06/16/23 1118  GLUCAP 186* 218* 266* 198*    Discharge time spent: greater than 30 minutes.  Signed: Justina Oman, MD [facility] 06/16/2023

## 2023-06-16 NOTE — Progress Notes (Incomplete)
 Attending Note  Patient seen and disucssed with PA Jorie Newness, I agree with her documentation. 78 yo male history of CAD with prior CABG in 2014 (LIMA-LAD, SVG-ramus, SVG-OM 2), HLD, chronic HFmrEF, DM2, HTN, presented with dizziness and unsteady gait, LUE weakness. Cardiology consulted for new diagnosis of afib noted during admission. Patient denies any specific palpitaitons.     K 3.6 BUN 23 Cr 0.88 Mg 1.8 TSH 3.4 WBC 7.6 Hgb 12.3 Plt 190  EKG afib 80s, LBBB CXR: no acute process CTA head/neck: no acute process Echo: LVE 55-60%, indet diastolic, normal RV, mild LAE, aortic root 42 mm   Assessment and plan  1.Afib - new diagnosis this admission - patient already on coreg  at home, rates have not been an issue -EKG and telemetry reviewed. Difficult interpretation as he has frequent PACs often with low amplitude P waves. However review would confirm episdoes of rate controlled afib -  he is paroxysmal during this admission, rates in afib are controlled - per neuro note if no acute findings on repeat CT ok to d/c plavix/asa and start anticoag. CT without acute findings, will make change to eliquis 5mg  bid.  - resume coreg  when window of permissive HTN for CVA like symptoms has resolved.   2. CAD prior CABG  - no acute issues this admission  3. HFimpEF 06/2020 echo: LVE 40-45% 05/2023 echo:  LVEF 55-60%, indet diastolic, normal RV, mild LAE, aortic root 42 mm - appears euvolemic - continue home regimen  No additional cardiology recs at this time, we will sign off inpatient care.

## 2023-06-16 NOTE — Consult Note (Signed)
 I connected with  George [name] on 06/16/23 by a video enabled telemedicine application and verified that I am speaking with the correct person using two identifiers.   I discussed the limitations of evaluation and management by telemedicine. The patient expressed understanding and agreed to proceed.  Location of the provider: [facility] Location of the patient: [facility]  Neurology Consultation Reason for Consult: stroke like symptoms Referring Physician: Brendia Callander Tat  CC: dizziness  History is obtained from: patient, chart review  HPI: George [name] is a 78 y.o. male  with PMH of CHF, HTN, DM, HLD, CAD s/p CABG, AAA, prostate cancer presenting with dizziness and unsteady gait. States he woke up on 06/14/2023 and noticed feeling dizzy/ off balancer. Briefly felt like left arm was numb and weak for about an hour which then resolved however dizziness persisted. Of note, while inpatient patient was noted to be in A fib. Cardiology has been consulted. Also patient refused MRI brain unless intubated.   LKN 06/13/2023 at bedtime. Can't tell me what time he went to bed  Event happened at home No tpa as outside window No thrombectomy as no LVO mRS 0   ROS: All other systems reviewed and negative except as noted in the HPI.   Past Medical History:  Diagnosis Date   Ascending aortic aneurysm (HCC)    4.1 cm in 2019   Chronic back pain    Coronary artery disease    Status post CABG 2014 (LIMA to LAD, SVG to ramus, SVG to OM 2)   Dermatophytosis of the body    Eczema    Essential hypertension    GERD (gastroesophageal reflux disease)    Herpes zoster without mention of complication    History of bronchitis    History of claustrophobia    HOH (hard of hearing)    Mixed hyperlipidemia    Prostate cancer (HCC)    Psoriasis and similar disorder    Sinusitis    Type 2 diabetes mellitus (HCC)     Family History  Problem Relation Age of Onset   CAD Other    Heart attack Other     Diabetes Other    Cataracts Mother    Amblyopia Neg Hx    Blindness Neg Hx    Glaucoma Neg Hx    Macular degeneration Neg Hx    Retinal detachment Neg Hx    Strabismus Neg Hx    Retinitis pigmentosa Neg Hx     Social History:  reports that he quit smoking about 56 years ago. His smoking use included cigarettes. He started smoking about 58 years ago. He has a 4 pack-year smoking history. He has been exposed to tobacco smoke. He has never used smokeless tobacco. He reports that he does not drink alcohol  and does not use drugs.   Medications Prior to Admission  Medication Sig Dispense Refill Last Dose/Taking   acetaminophen  (TYLENOL ) 500 MG tablet Take 1,000 mg by mouth every 6 (six) hours as needed for moderate pain.   06/13/2023   atorvastatin  (LIPITOR ) 80 MG tablet Take 1 tablet (80 mg total) by mouth daily. 30 tablet 3 06/13/2023   insulin  glargine (LANTUS ) 100 UNIT/ML Solostar Pen Inject 40 Units into the skin 2 (two) times daily.   06/14/2023 Morning   lisinopril  (ZESTRIL ) 2.5 MG tablet Take 2.5 mg by mouth daily.   06/14/2023 Morning   meclizine (ANTIVERT) 12.5 MG tablet Take 12.5 mg by mouth 3 (three) times daily as needed  for dizziness.   Unknown   nitroGLYCERIN  (NITROSTAT ) 0.4 MG SL tablet Place 1 tablet (0.4 mg total) under the tongue every 5 (five) minutes x 3 doses as needed for chest pain (if no relief after 3rd dose, proceed to ED for evaluation or call 911). 25 tablet 3 Unknown   NOVOLOG  FLEXPEN 100 UNIT/ML FlexPen INJECT 25-31 UNITS INTO THE SKIN THREE TIMES DAILY BEFORE MEALS (Patient taking differently: Inject 50 Units into the skin in the morning, at noon, and at bedtime.) 30 mL 0 06/14/2023 Morning   sulfamethoxazole-trimethoprim (BACTRIM DS) 800-160 MG tablet Take 1 tablet by mouth 2 (two) times daily.   Taking   amLODipine  (NORVASC ) 10 MG tablet Take 10 mg by mouth in the morning.      carvedilol  (COREG ) 12.5 MG tablet TAKE 1 TABLET BY MOUTH TWICE DAILY 60 tablet 3     clotrimazole-betamethasone (LOTRISONE) cream Apply 1 Application topically 2 (two) times daily.      furosemide  (LASIX ) 40 MG tablet Take 1 tablet (40 mg total) by mouth daily. 90 tablet 2    hydrALAZINE  (APRESOLINE ) 25 MG tablet TAKE 1 TABLET BY MOUTH THREE TIMES DAILY (Patient not taking: Reported on 06/15/2023) 90 tablet 1 Not Taking   Multiple Vitamins-Minerals (PRESERVISION AREDS 2) CAPS Take 1 capsule by mouth 2 (two) times daily.      zinc gluconate 50 MG tablet Take 50 mg by mouth daily.         Exam: Current vital signs: BP (!) 156/78 (BP Location: Left Arm)   Pulse (!) 102   Temp 99.2 F (37.3 C) (Oral)   Resp 16   Ht 6\' 3"  (1.905 m)   Wt 99.8 kg   SpO2 98%   BMI 27.50 kg/m  Vital signs in last 24 hours: Temp:  [98.1 F (36.7 C)-100.7 F (38.2 C)] 99.2 F (37.3 C) (06/02 0424) Pulse Rate:  [78-102] 102 (06/02 0424) Resp:  [11-24] 16 (06/02 0424) BP: (102-177)/(49-116) 156/78 (06/02 0424) SpO2:  [86 %-98 %] 98 % (06/02 0424) Weight:  [99.8 kg] 99.8 kg (06/01 0918)   Physical Exam  Constitutional: Appears well-developed and well-nourished.  Psych: Affect appropriate to situation Neuro:   NIHSS  I have reviewed labs in epic and the results pertinent to this consultation are: CBC:  Recent Labs  Lab 06/15/23 1448  WBC 7.6  HGB 12.3*  HCT 37.7*  MCV 84.7  PLT 190    Basic Metabolic Panel:  Lab Results  Component Value Date   NA 136 06/16/2023   K 3.4 (L) 06/16/2023   CO2 25 06/16/2023   GLUCOSE 224 (H) 06/16/2023   BUN 16 06/16/2023   CREATININE 0.82 06/16/2023   CALCIUM  8.5 (L) 06/16/2023   GFRNONAA >60 06/16/2023   GFRAA >60 08/11/2018   Lipid Panel:  Lab Results  Component Value Date   LDLCALC 18 06/16/2023   HgbA1c:  Lab Results  Component Value Date   HGBA1C 7.8 (H) 06/15/2023   Urine Drug Screen:     Component Value Date/Time   LABOPIA NONE DETECTED 06/15/2023 0954   COCAINSCRNUR NONE DETECTED 06/15/2023 0954   LABBENZ NONE  DETECTED 06/15/2023 0954   AMPHETMU NONE DETECTED 06/15/2023 0954   THCU NONE DETECTED 06/15/2023 0954   LABBARB NONE DETECTED 06/15/2023 0954    Alcohol  Level No results found for: "ETH"   I have reviewed the images obtained:  CT Head without contrast 06/15/2023: No acute intracranial abnormality.   CT angio Head and Neck with  contrast 06/15/2023: No large vessel occlusion, hemodynamically significant stenosis, or aneurysm in the head or neck.   ASSESSMENT/PLAN: 78yo M with sudden onset dizziness and transient left arm weakness/numbness. A fib noted on EKG.   Acute ischemic stroke - Patient refused MRI. However, symptoms highly concerning for acute stroke in setting of A fib  Recommendations: - Repeat CTH pending. If no stroke or small noted, ok to start on anticoagulation per cardiology - If large stroke, please notify neurology to evaluate for when to start anticoagulation - if started on AC, please dc asa and plavix - LDL 1, continue home statin - PT/OT - Stroke education - Goal BP: Normotension - Neurology f/u in 4-6 weeks ( ordered)   Thank you for allowing us  to participate in the care of this patient. If you have any further questions, please contact  me or neurohospitalist.   Roxy Cordial [facility] neurohospitalist

## 2023-06-16 NOTE — Consult Note (Addendum)
 Cardiology Consultation   Patient ID: DIOR STEPTER MRN: [id number]; DOB: 1945-05-27  Admit date: 06/15/2023 Date of Consult: 06/16/2023  PCP:  Theoplis Fix, MD   [facility] Providers Cardiologist:  Teddie Favre, MD     Patient Profile: George [name] is a 78 y.o. 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 Michaelene Admire.  History of Present Illness: George [name] was last seen in heartcare OV 11/2022 with Lasalle Pointer, NP for LE edema. Noted more abdominal and leg edema, which has improved with Lasix  as well as his SOB. PE revealed volume overload, resulting in Lasix  increased to 40 mg BID x 3 days then return to 40 mg daily.  Continued on amlodipine  10 mg, Lipitor  80 mg, Coreg  12.5 mg twice daily and NTG as needed.   Presented to ED 6/1 for dizziness and unsteady gait. Also note N/V, loose stool. Na 136, K 3.6, Cr 0.88, Mg 1.8, CBC WNL except chronic Hgb 12.3, TSH WNL. EKG afib, HR 84, with chronic LBBB. CXR noted enlargement of the cardiopericardial silhouette without acute cardiopulmonary findings. ECHO showed EF 55-60%, severe LVH, LA mildly dilated.   Concerned for stroke. CT head without any acute findings. .  Noted that patient was unable to do MRI unless intubated, which Dr. Bonnita Buttner did not think it would change his plan on treatment.  Awaiting formal neuro consult.  Started on ASA and Plavix.   On interview, patient is accompanied by son and daughter-in-law and history is obtained from patient and family.  Reported dyspnea with minimal exertion x 1 week relieved by rest.  Denied any CP, edema, orthopnea, PND, palpitations.  Noted that wife was recently diagnosed with pneumonia.  Reports good hydration, however some  concerns excess caffeine.  Notes 4-5 bottles of H2O, 2 to 3 cups of mixed decaf/caffeinated coffee, 2-3 bottles diet sun drop (110 mg caffeine each),and occasional tea. Reported snoring but no sleep apnea study. Noted compliance with medication. Stays at home with wife and able to complete ADLs. 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 Quit smoking 56 years ago after 2 years of 1/2 PPD. Denies ETOH and drug use.   Past Medical History:  Diagnosis Date   Ascending aortic aneurysm (HCC)    4.1 cm in 2019   Chronic back pain    Coronary artery disease    Status post CABG 2014 (LIMA to LAD, SVG to ramus, SVG to OM 2)   Dermatophytosis of the body    Eczema    Essential hypertension    GERD (gastroesophageal reflux disease)    Herpes zoster without mention of complication    History of bronchitis    History of claustrophobia    HOH (hard of hearing)    Mixed hyperlipidemia    Prostate cancer (HCC)    Psoriasis and similar disorder    Sinusitis    Type 2 diabetes mellitus (HCC)     Past Surgical History:  Procedure Laterality Date   APPLICATION OF WOUND VAC Left 07/23/2022   Procedure: APPLICATION OF WOUND VAC;  Surgeon: Shon Downing, MD;  Location: [facility] OR;  Service: Thoracic;  Laterality: Left;   APPLICATION OF WOUND VAC Left 07/26/2022   Procedure: APPLICATION OF WOUND VAC  CHANGE;  Surgeon: Shon Downing, MD;  Location: [facility] OR;  Service: Thoracic;  Laterality: Left;   CATARACT EXTRACTION W/PHACO Left 04/04/2014   Procedure: CATARACT EXTRACTION PHACO AND INTRAOCULAR LENS PLACEMENT (IOC);  Surgeon: Clay Cummins, MD;  Location: [facility] ORS;  Service: Ophthalmology;  Laterality: Left;  CDE:4.49   CATARACT EXTRACTION W/PHACO Right 01/02/2015   Procedure: CATARACT EXTRACTION PHACO AND INTRAOCULAR LENS PLACEMENT; CDE:  6.36;  Surgeon: Clay Cummins, MD;  Location: [facility];  Service: Ophthalmology;  Laterality: Right;   CIRCUMCISION  2010   CORONARY ARTERY BYPASS GRAFT N/A 08/13/2012    Procedure: CORONARY ARTERY BYPASS GRAFTING (CABG);  Surgeon: Heriberto London, MD;  Location: [facility] OR;  Service: Open Heart Surgery;  Laterality: N/A;   ENDOVEIN HARVEST OF GREATER SAPHENOUS VEIN Right 08/13/2012   Procedure: ENDOVEIN HARVEST OF GREATER SAPHENOUS VEIN;  Surgeon: Heriberto London, MD;  Location: [facility] OR;  Service: Open Heart Surgery;  Laterality: Right;   INTRAOPERATIVE TRANSESOPHAGEAL ECHOCARDIOGRAM N/A 08/13/2012   Procedure: INTRAOPERATIVE TRANSESOPHAGEAL ECHOCARDIOGRAM;  Surgeon: Heriberto London, MD;  Location: [facility] OR;  Service: Open Heart Surgery;  Laterality: N/A;   LEFT HEART CATHETERIZATION WITH CORONARY ANGIOGRAM N/A 08/07/2012   Procedure: LEFT HEART CATHETERIZATION WITH CORONARY ANGIOGRAM;  Surgeon: Peter M Swaziland, MD;  Location: [facility] CATH LAB;  Service: Cardiovascular;  Laterality: N/A;   RADIOACTIVE SEED IMPLANT N/A 11/25/2017   Procedure: RADIOACTIVE SEED IMPLANT/BRACHYTHERAPY IMPLANT;  Surgeon: Homero Luster, MD;  Location: [facility];  Service: Urology;  Laterality: N/A;   SEED IMPLANT DONE AT [place]  01/2017   STERNAL WOUND DEBRIDEMENT Left 07/23/2022   Procedure: LEFT STERNOCLAVICULAR JOINT DEBRIDEMENT;  Surgeon: Shon Downing, MD;  Location: [facility] OR;  Service: Thoracic;  Laterality: Left;   STERNAL WOUND DEBRIDEMENT Left 07/26/2022   Procedure: LEFT STERNOCLAVICULAR JOINT DEBRIDEMENT  WITH MYRIAD PLACEMENT;  Surgeon: Shon Downing, MD;  Location: [facility] OR;  Service: Thoracic;  Laterality: Left;   STERNAL WOUND DEBRIDEMENT Left 07/30/2022   Procedure: LEFT STERNOCLAVICULAR JOINT DEBRIDEMENT;  Surgeon: Shon Downing, MD;  Location: [facility] OR;  Service: Thoracic;  Laterality: Left;   TEE WITHOUT CARDIOVERSION N/A 06/12/2022   Procedure: TRANSESOPHAGEAL ECHOCARDIOGRAM;  Surgeon: Maudine Sos, MD;  Location: [facility] INVASIVE CV LAB;  Service: Cardiovascular;  Laterality: N/A;   WOUND EXPLORATION Left 09/26/2022   Procedure: DEBRIDEMENT LEFT STERNOCLAVICULAR JOINT;   Surgeon: Shon Downing, MD;  Location: [facility] OR;  Service: Thoracic;  Laterality: Left;     Home Medications:  Prior to Admission medications   Medication Sig Start Date End Date Taking? Authorizing Provider  acetaminophen  (TYLENOL ) 500 MG tablet Take 1,000 mg by mouth every 6 (six) hours as needed for moderate pain.   Yes [provider]  atorvastatin  (LIPITOR ) 80 MG tablet Take 1 tablet (80 mg total) by mouth daily. 06/14/22  Yes Ozell Blunt, MD  insulin  glargine (LANTUS ) 100 UNIT/ML Solostar Pen Inject 40 Units into the skin 2 (two) times daily.   Yes [provider]  lisinopril  (ZESTRIL ) 2.5 MG tablet Take 2.5 mg by mouth daily. 05/30/23  Yes [provider]  meclizine (ANTIVERT) 12.5 MG tablet Take 12.5 mg by mouth 3 (three) times daily as needed for dizziness. 06/14/23  Yes [provider]  nitroGLYCERIN  (NITROSTAT ) 0.4 MG SL tablet Place 1 tablet (0.4 mg total) under the tongue every 5 (five) minutes x 3 doses as needed for chest pain (if no relief after 3rd dose, proceed to ED for evaluation or call  911). 04/24/22  Yes Gerard Knight, MD  NOVOLOG  FLEXPEN 100 UNIT/ML FlexPen INJECT 25-31 UNITS INTO THE SKIN THREE TIMES DAILY BEFORE MEALS Patient taking differently: Inject 50 Units into the skin in the morning, at noon, and at bedtime. 09/18/17  Yes Nida, Gebreselassie W, MD  sulfamethoxazole-trimethoprim (BACTRIM DS) 800-160 MG tablet Take 1 tablet by mouth 2 (two) times daily. 05/16/23  Yes [provider]  amLODipine  (NORVASC ) 10 MG tablet Take 10 mg by mouth in the morning.    [provider]  carvedilol  (COREG ) 12.5 MG tablet TAKE 1 TABLET BY MOUTH TWICE DAILY 05/02/23   Gerard Knight, MD  clotrimazole-betamethasone (LOTRISONE) cream Apply 1 Application topically 2 (two) times daily. 06/12/23   [provider]  furosemide  (LASIX ) 40 MG tablet Take 1 tablet (40 mg total) by mouth daily. 11/21/22   Lasalle Pointer, NP   hydrALAZINE  (APRESOLINE ) 25 MG tablet TAKE 1 TABLET BY MOUTH THREE TIMES DAILY Patient not taking: Reported on 06/15/2023 01/13/23   Shon Downing, MD  Multiple Vitamins-Minerals (PRESERVISION AREDS 2) CAPS Take 1 capsule by mouth 2 (two) times daily.    [provider]  zinc gluconate 50 MG tablet Take 50 mg by mouth daily.    [provider]    Scheduled Meds:  aspirin  EC  81 mg Oral Daily   atorvastatin   80 mg Oral Daily   clopidogrel  75 mg Oral Daily   enoxaparin  (LOVENOX ) injection  40 mg Subcutaneous Q24H   insulin  aspart  0-15 Units Subcutaneous TID WC   insulin  aspart  0-5 Units Subcutaneous QHS   insulin  glargine-yfgn  20 Units Subcutaneous QHS   vitamin B-12  500 mcg Oral Daily   Continuous Infusions:  sodium chloride  40 mL/hr at 06/16/23 0008   PRN Meds: acetaminophen  **OR** acetaminophen  (TYLENOL ) oral liquid 160 mg/5 mL **OR** acetaminophen , haloperidol  lactate, hydrALAZINE , senna-docusate  Allergies:   No Known Allergies  Social History:   Social History   Socioeconomic History   Marital status: Married    Spouse name: Layson Bertsch   Number of children: 2   Years of education: 12   Highest education level: 12th grade  Occupational History    Comment: logger  Tobacco Use   Smoking status: Former    Current packs/day: 0.00    Average packs/day: 1 pack/day for 4.0 years (4.0 ttl pk-yrs)    Types: Cigarettes    Start date: 03/04/1965    Quit date: 03/05/1967    Years since quitting: 56.3    Passive exposure: Past   Smokeless tobacco: Never  Vaping Use   Vaping status: Never Used  Substance and Sexual Activity   Alcohol  use: No    Alcohol /week: 0.0 standard drinks of alcohol    Drug use: No   Sexual activity: Not Currently    Partners: Female  Other Topics Concern   Not on file  Social History Narrative   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   Family History:   Family History  Problem Relation Age of Onset   CAD  Other    Heart attack Other    Diabetes Other    Cataracts Mother    Amblyopia Neg Hx    Blindness Neg Hx    Glaucoma Neg Hx    Macular degeneration Neg Hx    Retinal detachment Neg Hx    Strabismus Neg Hx    Retinitis pigmentosa Neg Hx      ROS:  Please see the  history of present illness.  All other ROS reviewed and negative.     Physical Exam/Data: Vitals:   06/15/23 1742 06/15/23 2045 06/15/23 2359 06/16/23 0424  BP: (!) 126/94 (!) 102/49 (!) 129/56 (!) 156/78  Pulse: 98 100 96 (!) 102  Resp: 20 20 16 16   Temp: 99.2 F (37.3 C) 99.6 F (37.6 C) (!) 100.7 F (38.2 C) 99.2 F (37.3 C)  TempSrc: Oral Oral Oral Oral  SpO2: 98% 97% 90% 98%  Weight:      Height:        Intake/Output Summary (Last 24 hours) at 06/16/2023 1610 Last data filed at 06/16/2023 0008 Gross per 24 hour  Intake 1440 ml  Output 750 ml  Net 690 ml      06/15/2023    9:18 AM 03/13/2023   10:29 AM 02/26/2023    3:31 PM  Last 3 Weights  Weight (lbs) 220 lb 235 lb 6.4 oz 237 lb  Weight (kg) 99.791 kg 106.777 kg 107.502 kg     Body mass index is 27.5 kg/m.  General:  Sitting on side of bed, in no acute distress, with son and daughter in law at his side. HEENT: normal Neck: no JVD Vascular: No carotid bruits; Distal pulses 2+ bilaterally Cardiac:  normal S1, S2; RRR; no murmur  Lungs:  clear to auscultation bilaterally, no wheezing, rhonchi or rales  Abd: soft, nontender, no hepatomegaly  Ext: no edema Musculoskeletal:  No deformities, BUE and BLE strength normal and equal Skin: warm and dry  Neuro:  CNs 2-12 intact, no focal abnormalities noted Psych:  Normal affect   EKG:  The EKG was personally reviewed and demonstrates:  Afib, 84, chronic LBBB Telemetry:  Telemetry was personally reviewed and demonstrates:  NSR, HR 90-100's with occasional PAC and PVC. Look like Afib ended yesterday b/t 1800-2000 when off monitor.  Relevant CV Studies: ECHO 06/15/2023 IMPRESSIONS   1. Left ventricular  ejection fraction, by estimation, is 55 to 60%. The  left ventricle has normal function. Left ventricular endocardial border  not optimally defined to evaluate regional wall motion. There is severe  left ventricular hypertrophy. Left  ventricular diastolic parameters are indeterminate.   2. Right ventricular systolic function is normal. The right ventricular  size is normal. Tricuspid regurgitation signal is inadequate for assessing  PA pressure.   3. Left atrial size was mildly dilated.   4. The mitral valve is normal in structure. No evidence of mitral valve  regurgitation. No evidence of mitral stenosis.   5. The aortic valve is tricuspid. Aortic valve regurgitation is not  visualized. No aortic stenosis is present.   6. Aortic dilatation noted. There is mild dilatation of the aortic root,  measuring 42 mm. There is mild dilatation of the ascending aorta,  measuring 38 mm.   TEE 05/2022: 1. Left ventricular ejection fraction, by estimation, is 45 to 50%. The  left ventricle has mildly decreased function. The left ventricle  demonstrates global hypokinesis.   2. Right ventricular systolic function is normal. The right ventricular  size is normal.   3. No left atrial/left atrial appendage thrombus was detected.   4. The mitral valve is normal in structure. Mild mitral valve  regurgitation. No evidence of mitral stenosis.   5. Eccentric anteriorly directed aortic regurgitation. The aortic valve  is tricuspid. Aortic valve regurgitation is mild to moderate. No aortic  stenosis is present.   6. The inferior vena cava is normal in size with greater than 50%  respiratory variability, suggesting right atrial pressure of 3 mmHg.   Conclusion(s)/Recommendation(s): No evidence of vegetation/infective  endocarditis on this transesophageael echocardiogram.   Echo 05/2022:  1. Left ventricular ejection fraction, by estimation, is 50%. The left  ventricle has mildly decreased function. Left  ventricular endocardial  border not optimally defined to evaluate regional wall motion. There is  moderate left ventricular hypertrophy.  Left ventricular diastolic parameters are indeterminate. Elevated left  atrial pressure.   2. Right ventricular systolic function is normal. The right ventricular  size is normal. Tricuspid regurgitation signal is inadequate for assessing  PA pressure.   3. The mitral valve is abnormal. Mild mitral valve regurgitation. No  evidence of mitral stenosis.   4. The aortic valve is tricuspid. There is mild calcification of the  aortic valve. There is mild thickening of the aortic valve. Aortic valve  regurgitation is trivial. No aortic stenosis is present.   5. Aortic dilatation noted. There is mild dilatation of the aortic root,  measuring 42 mm. There is mild dilatation of the ascending aorta,  measuring 40 mm.   Comparison(s): Echocardiogram done 07/05/20 showed an EF of 40-45%.   Lexiscan  04/2022:   Findings are consistent with prior infarction. The study is intermediate risk due to reduced systolic function.   No ST deviation was noted.   LV perfusion is abnormal. There is no evidence of ischemia. There is evidence of infarction. Defect 1: There is a medium defect with moderate reduction in uptake present in the apical to basal location(s) that is fixed. There is abnormal wall motion in the defect area. Consistent with infarction and artifact.   Left ventricular function is abnormal. Global function is moderately reduced. There were multiple regional abnormalities. Nuclear stress EF: 38 %. The left ventricular ejection fraction is moderately decreased (30-44%). End diastolic cavity size is moderately enlarged. End systolic cavity size is moderately enlarged.   Prior study available for comparison from 10/16/2020.  No significant changes.  Laboratory Data: High Sensitivity Troponin:  No results for input(s): "TROPONINIHS" in the last 720 hours.    Chemistry Recent Labs  Lab 06/15/23 1128 06/16/23 0239  NA 136 136  K 3.6 3.4*  CL 97* 102  CO2 28 25  GLUCOSE 150* 224*  BUN 23 16  CREATININE 0.88 0.82  CALCIUM  9.1 8.5*  MG 1.8 1.6*  GFRNONAA >60 >60  ANIONGAP 11 9    No results for input(s): "PROT", "ALBUMIN ", "AST", "ALT", "ALKPHOS", "BILITOT" in the last 168 hours. Lipids  Recent Labs  Lab 06/16/23 0239  CHOL 116  TRIG 374*  HDL 23*  LDLCALC 18  CHOLHDL 5.0    Hematology Recent Labs  Lab 06/15/23 1448  WBC 7.6  RBC 4.45  HGB 12.3*  HCT 37.7*  MCV 84.7  MCH 27.6  MCHC 32.6  RDW 15.0  PLT 190   Thyroid   Recent Labs  Lab 06/15/23 1128 06/15/23 1448  TSH 3.498  --   FREET4  --  0.79    BNPNo results for input(s): "BNP", "PROBNP" in the last 168 hours.  DDimer No results for input(s): "DDIMER" in the last 168 hours.  Radiology/Studies:  DG Chest Port 1 View Result Date: 06/15/2023 CLINICAL DATA:  Dizziness with nausea. EXAM: PORTABLE CHEST 1 VIEW COMPARISON:  09/26/2022 FINDINGS: The cardio pericardial silhouette is enlarged. The lungs are clear without focal pneumonia, edema, pneumothorax or pleural effusion. No acute bony abnormality. Telemetry leads overlie the chest. IMPRESSION: Enlargement of the cardiopericardial silhouette without acute  cardiopulmonary findings. Electronically Signed   By: Donnal Fusi M.D.   On: 06/15/2023 12:06   CT ANGIO HEAD NECK W WO CM Result Date: 06/15/2023 EXAM: CT HEAD WITHOUT CTA HEAD AND NECK WITH AND WITHOUT 06/15/2023 10:21:28 AM TECHNIQUE: CTA of the head and neck was performed with and without the administration of intravenous contrast. Noncontrast CT of the head with reconstructed 2-D images are also provided for review. Multiplanar 2D and/or 3D reformatted images are provided for review. Automated exposure control, iterative reconstruction, and/or weight based adjustment of the mA/kV was utilized to reduce the radiation dose to as low as reasonably achievable.  COMPARISON: 08/12/2018 CLINICAL HISTORY: Neuro deficit, acute, stroke suspected; ataxia, nausea. Started taking Meclizine yesterday and N/V started. Was dizzy all day yesterday and went to [facility]. FINDINGS: CT HEAD: BRAIN AND VENTRICLES: No acute intracranial hemorrhage. No mass effect or midline shift. No extra-axial fluid collection. Gray-white differentiation is maintained. No hydrocephalus. Generalized volume loss within expected range for patient age. ORBITS: No acute abnormality. SINUSES: No acute abnormality. SOFT TISSUES AND SKULL: No acute abnormality. CTA NECK: AORTIC ARCH AND ARCH VESSELS: No dissection or arterial injury. No significant stenosis of the brachiocephalic or subclavian arteries. CERVICAL CAROTID ARTERIES: No dissection, arterial injury, or hemodynamically significant stenosis by NASCET criteria. Mild calcified plaque along the bilateral carotid bulbs without hemodynamically significant stenosis. CERVICAL VERTEBRAL ARTERIES: The right vertebral artery is patent to the basilar confluence without significant stenosis or evidence of dissection. VISUALIZED LUNGS AND MEDIASTINUM: Unremarkable. SOFT TISSUES: No acute abnormality. BONES: Chronic-appearing deformity of the left clavicular head with widening of the left sternoclavicular joint. CTA HEAD: ANTERIOR CIRCULATION: No significant stenosis of the internal carotid arteries. No significant stenosis of the anterior cerebral arteries. No significant stenosis of the middle cerebral arteries. No aneurysm. Atherosclerotic calcifications of the carotid siphons without significant stenosis or aneurysm. POSTERIOR CIRCULATION: No significant stenosis of the posterior cerebral arteries. No significant stenosis of the basilar artery. Calcified plaque along the left V4 segment of the vertebral artery results in at least mild stenosis. No aneurysm. OTHER: No dural venous sinus thrombosis on this non-dedicated study. IMPRESSION: 1. No acute  intracranial abnormality. 2. No large vessel occlusion, hemodynamically significant stenosis, or aneurysm in the head or neck. Electronically signed by: Audra Blend MD 06/15/2023 10:34 AM EDT RP Workstation: [id number]     Assessment and Plan: New onset Afib  Presented to the ED with stroke like concerns and dyspnea with minimal exertion. EKG showed A-fib , HR 84 with chronic LBBB.  Telemetry this a.m. showed NSR, HR 90-100's with occasional PAC and PVC. Look like Afib ended yesterday b/t 1800-2000 when off monitor. Currently, denies any symptoms  - TSH WNL, CBC WNL except chronic stable hgb 12.3, K 3.4, Mg 1.6 (in setting of arrhythmia, goal K > 4, Mg>2). Ordering supplemental K  & Mg.  - Noted some excessive caffeine intake. Reports snoring but no sleep apnea study. denied any recent infections, dehydration, excessive etoh, drug use.  - Not currently on rate control seems controled. Would off until neuro consults. Holding home Coreg .  - CHADVASC 5. Discussed AC. History of nosebleeds in the past and 1 fall. Denied any active bleeding or falls. Can consider Eliquis 5 mg BID. However, would be on triple therapy.  Will discuss with MD. Awaiting neuro consult. - could be 2/2 to undiagnosed sleep apnea or excessive caffeine  - Encouraged to decrease caffeine intake.  - Can refer for sleep study in outpatient  HTN  - Per chart review, allowing permissive hypertension  - BP this am: 156/78 - Holding amlodipine  10 mg, Lisinopril  2.5 mg, Coreg  12.5 mg twice daily.   CAD, s/p CABG HLD  - s/p CABG in 2014: LIMA-LAD, SVG-ramus, SVG-OM 2 - 2022 myoview : no ischemia  - 04/2022 Lexiscan : no ischemia; prior infarct in apical to basal; EF 38% - Stable with no anginal symptoms. No indication for ischemic evaluation.  - LDL 18 - Continue ASA 81 mg, Lipitor  80 mg daily  - Holding Coreg , Lisinopril  as above.  - Heart healthy diet encouraged.  HFpEF  - TEE 05/2022: EF 45-50%, global HK, mild MR,  normal RVF  Appear  - ECHO 06/15/2023: EF 55-60%, severe LVH, LA mildly dilated - Reported dyspnea with minimal exertion x 1 week, relieved with rest.  - Appear Euvolemic. No need for home lasix  at this time.  - Holding Lisinopril  and Coreg  as above.   Ascending Aortic Aneurysm  - 05/2022 ECHO: Mild dilatation ascending aorta measuring 40 mm and aortic root was found to be mildly dilated at 42 mm.  - 06/15/2023 ECHO: mild dilatation of the ascending aorta measuring 38 mm. mild dilatation of the aortic root measuring 42 mm.  - continue to monitor  Possible Stroke  - CT head WNL. Unable to complete MRI head  - Continue on ASA 81 mg and Plavix 75  mg daily.  - managed by primary  - awaiting neuro consult   Risk Assessment/Risk Scores:     [place]  Heart Association (NYHA) Functional Class NYHA Class III  CHA2DS2-VASc Score = 5  This indicates a 7.2% annual risk of stroke. The patient's score is based upon: CHF History: 1 HTN History: 1 Diabetes History: 1 Stroke History: 0 Vascular Disease History: 0 Age Score: 2 Gender Score: 0        For questions or updates, please contact [facility] Please consult [email or web address] for contact info under    Signed, Metta Actis, PA-C  06/16/2023 9:22 AM   Attending Note  Patient seen and disucssed with PA Jorie Newness, I agree with her documentation. 78 yo male history of CAD with prior CABG in 2014 (LIMA-LAD, SVG-ramus, SVG-OM 2), HLD, chronic HFmrEF, DM2, HTN, presented with dizziness and unsteady gait, LUE weakness. Cardiology consulted for new diagnosis of afib noted during admission. Patient denies any specific palpitaitons.     K 3.6 BUN 23 Cr 0.88 Mg 1.8 TSH 3.4 WBC 7.6 Hgb 12.3 Plt 190  EKG afib 80s, LBBB CXR: no acute process CTA head/neck: no acute process Echo: LVE 55-60%, indet diastolic, normal RV, mild LAE, aortic root 42 mm   Assessment and plan  1.Afib - new diagnosis this admission - patient already on  coreg  at home, rates have not been an issue -EKG and telemetry reviewed. Difficult interpretation as he has frequent PACs often with low amplitude P waves. However review would confirm episdoes of rate controlled afib -  he is paroxysmal during this admission, rates in afib are controlled - discuss anticoag with primary team and neuro, ASA and plavix have been started  - per neuro note if no acute findings on repeat CT ok to d/c plavix/asa and start anticoag. CT without acute findings, will make change to eliquis 5mg  bid.   2. CAD prior CABG  - no acute issues this admission  3. HFimpEF 06/2020 echo: LVE 40-45% 05/2023 echo:  LVEF 55-60%, indet diastolic, normal RV, mild LAE, aortic root 42 mm - appears  euvolemic - continue home regimen  4. CVA like symptoms - per neurology  No additional cardiology recs at this time, we will sign off inpatient care.   Armida Lander MD

## 2023-06-16 NOTE — Progress Notes (Signed)
   06/16/23 1228  TOC Brief Assessment  Insurance and Status Reviewed  Patient has primary care physician Yes  Home environment has been reviewed From home c/wife.  Prior level of function: Assisted  Prior/Current Home Services No current home services  Social Drivers of Health Review SDOH reviewed no interventions necessary  Readmission risk has been reviewed Yes  Transition of care needs transition of care needs identified, TOC will continue to follow   Pt will dc home c/family support. Lives c/wife c/children close by. Pt has DME. HHPT recommended. [facility] to provide services.

## 2023-06-16 NOTE — Evaluation (Signed)
 Occupational Therapy Evaluation Patient Details Name: George [name] MRN: [id number] DOB: [birth year] Today's Date: 06/16/2023   History of Present Illness   George [name] is a 78 year old male with 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  The patient states that he woke up on the morning of 06/14/2023 with the above symptoms.  He states that the symptoms are precipitated by sitting up or standing up and can last up to 30 minutes.  He denies loss of consciousness, but he did have an episode where he fell back onto the bed.  He has had some unsteady gait as a result of his dizziness.  He does not particularly feel like the room is spinning around.  The patient denies any alcohol  use or new medications.  He states that he did have some left upper extremity numbness and weakness that lasted about an hour on 06/14/2023.  He has not had recurrence of this episode since that.  He denies facial droop, word finding difficulties, dysarthria, or other dysesthesia.  Interestingly, he states that he feels like he is having some difficulty swallowing and choking on liquids for the past 2 days.  He denies any headache, visual disturbance, fevers, chills, chest pain, shortness of breath, coughing, hemoptysis.     Notably, granddaughter at the bedside states that there has been some viral respiratory symptoms going around at home.  The patient had nausea and vomiting intermittently for the last 2 days.  He also had some loose stools earlier this week which have resolved.  His oral intake has not been great this week.  He denies any frank fevers.  There is no abdominal pain, hematochezia, melena, dysuria. (per MD)     Clinical Impressions Pt agreeable to OT and PT co-evaluation. Pt lethargic but able to follow commands with time. WFL  B UE strength and coordination. Mildly unsteady  during ambulation, deferring to PT for balance training. Pt reportedly had double vision at times in the past 24 hours but reports no double vision at this time. Pt would benefit from an evaluation from a neuro vision specialist. Pt is not recommended for further acute OT services and will be discharged to care of nursing staff for remaining length of stay.      If plan is discharge home, recommend the following:   A little help with walking and/or transfers;Assist for transportation     Functional Status Assessment   Patient has not had a recent decline in their functional status     Equipment Recommendations   None recommended by OT     Recommendations for Other Services   Other (comment) (Evaluation from neuro visioni specialist.)     Precautions/Restrictions   Precautions Precautions: Fall Recall of Precautions/Restrictions: Intact Restrictions Weight Bearing Restrictions Per Provider Order: No     Mobility Bed Mobility Overal bed mobility: Independent                  Transfers Overall transfer level: Needs assistance   Transfers: Sit to/from Stand Sit to Stand: Supervision           General transfer comment: mildly unsteady; labored movement.      Balance Overall balance assessment: Mild deficits observed, not formally tested  ADL either performed or assessed with clinical judgement   ADL Overall ADL's : Needs assistance/impaired     Grooming: Standing;Supervision/safety       Lower Body Bathing: Supervison/ safety;Sit to/from stand       Lower Body Dressing: Supervision/safety;Sit to/from stand   Toilet Transfer: Supervision/safety;Ambulation Toilet Transfer Details (indicate cue type and reason): Simulated via ambulation in the room and hall.         Functional mobility during ADLs: Supervision/safety;Contact guard assist       Vision Baseline Vision/History:   (History of vision issuse, no glasses at this time.) Ability to See in Adequate Light: 1 Impaired Patient Visual Report: Diplopia (Pt reports vision to be normal at this time without diplopia.) Vision Assessment?: Yes Tracking/Visual Pursuits: Other (comment) (Pt noted to fatigue with eyes closing during tracking. LIkely related to lethargy.) Convergence: Impaired (comment) (none noted during testing) Visual Fields: No apparent deficits Diplopia Assessment: Other (comment) (Mixed report from family but pt reports normal vision at this time. Able to correctly identify numbers at midline.)     Perception Perception: Not tested       Praxis Praxis: Not tested       Pertinent Vitals/Pain Pain Assessment Pain Assessment: No/denies pain     Extremity/Trunk Assessment Upper Extremity Assessment Upper Extremity Assessment: Overall WFL for tasks assessed;Right hand dominant   Lower Extremity Assessment Lower Extremity Assessment: Defer to PT evaluation   Cervical / Trunk Assessment Cervical / Trunk Assessment: Normal   Communication Communication Communication: No apparent difficulties   Cognition Arousal: Lethargic Behavior During Therapy: WFL for tasks assessed/performed Cognition: No apparent impairments                               Following commands: Intact       Cueing  General Comments   Cueing Techniques: Verbal cues                 Home Living Family/patient expects to be discharged to:: Private residence Living Arrangements: Spouse/significant other Available Help at Discharge: Family Type of Home: House Home Access: Stairs to enter Secretary/administrator of Steps: 2   Home Layout: One level     Bathroom Shower/Tub: Chief Strategy Officer: Standard     Home Equipment: Shower seat;Grab bars - tub/shower;Toilet riser   Additional Comments: Pt reported same living history as prior admission.      Prior  Functioning/Environment Prior Level of Function : Independent/Modified Independent             Mobility Comments: No AD ADLs Comments: Independent; drives                 OT Goals(Current goals can be found in the care plan section)   Acute Rehab OT Goals Patient Stated Goal: return home         Co-evaluation PT/OT/SLP Co-Evaluation/Treatment: Yes Reason for Co-Treatment: To address functional/ADL transfers   OT goals addressed during session: ADL's and self-care      AM-PAC OT "6 Clicks" Daily Activity     Outcome Measure Help from another person eating meals?: None Help from another person taking care of personal grooming?: None Help from another person toileting, which includes using toliet, bedpan, or urinal?: None Help from another person bathing (including washing, rinsing, drying)?: None Help from another person to put on and taking off regular upper body clothing?: None Help from another person to put  on and taking off regular lower body clothing?: None 6 Click Score: 24   End of Session    Activity Tolerance: Patient tolerated treatment well Patient left: in bed;with call bell/phone within reach;with family/visitor present  OT Visit Diagnosis: Unsteadiness on feet (R26.81);Muscle weakness (generalized) (M62.81);Low vision, both eyes (H54.2);Other symptoms and signs involving the nervous system (G95.621)                Time: 3086-5784 OT Time Calculation (min): 16 min Charges:  OT General Charges $OT Visit: 1 Visit OT Evaluation $OT Eval Low Complexity: 1 Low  Satina Jerrell OT, MOT  Thurnell Floss 06/16/2023, 9:18 AM

## 2023-06-17 NOTE — Progress Notes (Signed)
   06/17/2023  Patient ID: George [name], male   DOB: 06/24/1945, 78 y.o.   MRN: [id number]  Reviewed patient regarding medication adherence from a quality report for [facility]. The patient failed MAC and [facility] in 2024. Current regimen confirmed by Alexandria Angel, PharmD.   Per DrFirst fill history: Lisinopril  2.5 mg - last filled 05/30/23 for a 90-day supply.  Atorvastatin  80 mg - last filled 05/30/23 for a 90-day supply.    I will follow up for adherence monitoring.   Thank you for allowing pharmacy to be a part of this patient's care.    Livia Riffle, PharmD Clinical Pharmacist  [phone]

## 2023-06-18 DIAGNOSIS — I5022 Chronic systolic (congestive) heart failure: Secondary | ICD-10-CM | POA: Diagnosis not present

## 2023-06-18 DIAGNOSIS — G8192 Hemiplegia, unspecified affecting left dominant side: Secondary | ICD-10-CM | POA: Diagnosis not present

## 2023-06-18 DIAGNOSIS — I2510 Atherosclerotic heart disease of native coronary artery without angina pectoris: Secondary | ICD-10-CM | POA: Diagnosis not present

## 2023-06-18 DIAGNOSIS — Z951 Presence of aortocoronary bypass graft: Secondary | ICD-10-CM | POA: Diagnosis not present

## 2023-06-18 DIAGNOSIS — I110 Hypertensive heart disease with heart failure: Secondary | ICD-10-CM | POA: Diagnosis not present

## 2023-06-18 DIAGNOSIS — G8929 Other chronic pain: Secondary | ICD-10-CM | POA: Diagnosis not present

## 2023-06-18 DIAGNOSIS — Z87891 Personal history of nicotine dependence: Secondary | ICD-10-CM | POA: Diagnosis not present

## 2023-06-18 DIAGNOSIS — Z7901 Long term (current) use of anticoagulants: Secondary | ICD-10-CM | POA: Diagnosis not present

## 2023-06-18 DIAGNOSIS — I480 Paroxysmal atrial fibrillation: Secondary | ICD-10-CM | POA: Diagnosis not present

## 2023-06-18 DIAGNOSIS — M545 Low back pain, unspecified: Secondary | ICD-10-CM | POA: Diagnosis not present

## 2023-06-18 DIAGNOSIS — E782 Mixed hyperlipidemia: Secondary | ICD-10-CM | POA: Diagnosis not present

## 2023-06-18 DIAGNOSIS — R1310 Dysphagia, unspecified: Secondary | ICD-10-CM | POA: Diagnosis not present

## 2023-06-18 DIAGNOSIS — I714 Abdominal aortic aneurysm, without rupture, unspecified: Secondary | ICD-10-CM | POA: Diagnosis not present

## 2023-06-18 DIAGNOSIS — Z794 Long term (current) use of insulin: Secondary | ICD-10-CM | POA: Diagnosis not present

## 2023-06-18 DIAGNOSIS — E1165 Type 2 diabetes mellitus with hyperglycemia: Secondary | ICD-10-CM | POA: Diagnosis not present

## 2023-06-25 DIAGNOSIS — G8929 Other chronic pain: Secondary | ICD-10-CM | POA: Diagnosis not present

## 2023-06-25 DIAGNOSIS — Z87891 Personal history of nicotine dependence: Secondary | ICD-10-CM | POA: Diagnosis not present

## 2023-06-25 DIAGNOSIS — E1165 Type 2 diabetes mellitus with hyperglycemia: Secondary | ICD-10-CM | POA: Diagnosis not present

## 2023-06-25 DIAGNOSIS — R1310 Dysphagia, unspecified: Secondary | ICD-10-CM | POA: Diagnosis not present

## 2023-06-25 DIAGNOSIS — Z7901 Long term (current) use of anticoagulants: Secondary | ICD-10-CM | POA: Diagnosis not present

## 2023-06-25 DIAGNOSIS — I480 Paroxysmal atrial fibrillation: Secondary | ICD-10-CM | POA: Diagnosis not present

## 2023-06-25 DIAGNOSIS — I2510 Atherosclerotic heart disease of native coronary artery without angina pectoris: Secondary | ICD-10-CM | POA: Diagnosis not present

## 2023-06-25 DIAGNOSIS — I110 Hypertensive heart disease with heart failure: Secondary | ICD-10-CM | POA: Diagnosis not present

## 2023-06-25 DIAGNOSIS — M545 Low back pain, unspecified: Secondary | ICD-10-CM | POA: Diagnosis not present

## 2023-06-25 DIAGNOSIS — I714 Abdominal aortic aneurysm, without rupture, unspecified: Secondary | ICD-10-CM | POA: Diagnosis not present

## 2023-06-25 DIAGNOSIS — I5022 Chronic systolic (congestive) heart failure: Secondary | ICD-10-CM | POA: Diagnosis not present

## 2023-06-25 DIAGNOSIS — E782 Mixed hyperlipidemia: Secondary | ICD-10-CM | POA: Diagnosis not present

## 2023-06-25 DIAGNOSIS — G8192 Hemiplegia, unspecified affecting left dominant side: Secondary | ICD-10-CM | POA: Diagnosis not present

## 2023-06-25 DIAGNOSIS — Z951 Presence of aortocoronary bypass graft: Secondary | ICD-10-CM | POA: Diagnosis not present

## 2023-06-25 DIAGNOSIS — Z794 Long term (current) use of insulin: Secondary | ICD-10-CM | POA: Diagnosis not present

## 2023-06-27 DIAGNOSIS — G8929 Other chronic pain: Secondary | ICD-10-CM | POA: Diagnosis not present

## 2023-06-27 DIAGNOSIS — I5022 Chronic systolic (congestive) heart failure: Secondary | ICD-10-CM | POA: Diagnosis not present

## 2023-06-27 DIAGNOSIS — R1310 Dysphagia, unspecified: Secondary | ICD-10-CM | POA: Diagnosis not present

## 2023-06-27 DIAGNOSIS — I480 Paroxysmal atrial fibrillation: Secondary | ICD-10-CM | POA: Diagnosis not present

## 2023-06-27 DIAGNOSIS — Z794 Long term (current) use of insulin: Secondary | ICD-10-CM | POA: Diagnosis not present

## 2023-06-27 DIAGNOSIS — Z87891 Personal history of nicotine dependence: Secondary | ICD-10-CM | POA: Diagnosis not present

## 2023-06-27 DIAGNOSIS — I110 Hypertensive heart disease with heart failure: Secondary | ICD-10-CM | POA: Diagnosis not present

## 2023-06-27 DIAGNOSIS — E1165 Type 2 diabetes mellitus with hyperglycemia: Secondary | ICD-10-CM | POA: Diagnosis not present

## 2023-06-27 DIAGNOSIS — E782 Mixed hyperlipidemia: Secondary | ICD-10-CM | POA: Diagnosis not present

## 2023-06-27 DIAGNOSIS — Z7901 Long term (current) use of anticoagulants: Secondary | ICD-10-CM | POA: Diagnosis not present

## 2023-06-27 DIAGNOSIS — I714 Abdominal aortic aneurysm, without rupture, unspecified: Secondary | ICD-10-CM | POA: Diagnosis not present

## 2023-06-27 DIAGNOSIS — I2510 Atherosclerotic heart disease of native coronary artery without angina pectoris: Secondary | ICD-10-CM | POA: Diagnosis not present

## 2023-06-27 DIAGNOSIS — M545 Low back pain, unspecified: Secondary | ICD-10-CM | POA: Diagnosis not present

## 2023-06-27 DIAGNOSIS — Z951 Presence of aortocoronary bypass graft: Secondary | ICD-10-CM | POA: Diagnosis not present

## 2023-06-27 DIAGNOSIS — G8192 Hemiplegia, unspecified affecting left dominant side: Secondary | ICD-10-CM | POA: Diagnosis not present

## 2023-06-30 DIAGNOSIS — I110 Hypertensive heart disease with heart failure: Secondary | ICD-10-CM | POA: Diagnosis not present

## 2023-06-30 DIAGNOSIS — Z7901 Long term (current) use of anticoagulants: Secondary | ICD-10-CM | POA: Diagnosis not present

## 2023-06-30 DIAGNOSIS — G8192 Hemiplegia, unspecified affecting left dominant side: Secondary | ICD-10-CM | POA: Diagnosis not present

## 2023-06-30 DIAGNOSIS — Z87891 Personal history of nicotine dependence: Secondary | ICD-10-CM | POA: Diagnosis not present

## 2023-06-30 DIAGNOSIS — I5022 Chronic systolic (congestive) heart failure: Secondary | ICD-10-CM | POA: Diagnosis not present

## 2023-06-30 DIAGNOSIS — I714 Abdominal aortic aneurysm, without rupture, unspecified: Secondary | ICD-10-CM | POA: Diagnosis not present

## 2023-06-30 DIAGNOSIS — I480 Paroxysmal atrial fibrillation: Secondary | ICD-10-CM | POA: Diagnosis not present

## 2023-06-30 DIAGNOSIS — Z951 Presence of aortocoronary bypass graft: Secondary | ICD-10-CM | POA: Diagnosis not present

## 2023-06-30 DIAGNOSIS — I2510 Atherosclerotic heart disease of native coronary artery without angina pectoris: Secondary | ICD-10-CM | POA: Diagnosis not present

## 2023-06-30 DIAGNOSIS — E782 Mixed hyperlipidemia: Secondary | ICD-10-CM | POA: Diagnosis not present

## 2023-06-30 DIAGNOSIS — G8929 Other chronic pain: Secondary | ICD-10-CM | POA: Diagnosis not present

## 2023-06-30 DIAGNOSIS — E1165 Type 2 diabetes mellitus with hyperglycemia: Secondary | ICD-10-CM | POA: Diagnosis not present

## 2023-06-30 DIAGNOSIS — M545 Low back pain, unspecified: Secondary | ICD-10-CM | POA: Diagnosis not present

## 2023-06-30 DIAGNOSIS — Z794 Long term (current) use of insulin: Secondary | ICD-10-CM | POA: Diagnosis not present

## 2023-06-30 DIAGNOSIS — R1310 Dysphagia, unspecified: Secondary | ICD-10-CM | POA: Diagnosis not present

## 2023-07-03 DIAGNOSIS — M545 Low back pain, unspecified: Secondary | ICD-10-CM | POA: Diagnosis not present

## 2023-07-03 DIAGNOSIS — R1310 Dysphagia, unspecified: Secondary | ICD-10-CM | POA: Diagnosis not present

## 2023-07-03 DIAGNOSIS — G8192 Hemiplegia, unspecified affecting left dominant side: Secondary | ICD-10-CM | POA: Diagnosis not present

## 2023-07-03 DIAGNOSIS — Z951 Presence of aortocoronary bypass graft: Secondary | ICD-10-CM | POA: Diagnosis not present

## 2023-07-03 DIAGNOSIS — I110 Hypertensive heart disease with heart failure: Secondary | ICD-10-CM | POA: Diagnosis not present

## 2023-07-03 DIAGNOSIS — I2510 Atherosclerotic heart disease of native coronary artery without angina pectoris: Secondary | ICD-10-CM | POA: Diagnosis not present

## 2023-07-03 DIAGNOSIS — E1165 Type 2 diabetes mellitus with hyperglycemia: Secondary | ICD-10-CM | POA: Diagnosis not present

## 2023-07-03 DIAGNOSIS — G8929 Other chronic pain: Secondary | ICD-10-CM | POA: Diagnosis not present

## 2023-07-03 DIAGNOSIS — I480 Paroxysmal atrial fibrillation: Secondary | ICD-10-CM | POA: Diagnosis not present

## 2023-07-03 DIAGNOSIS — Z7901 Long term (current) use of anticoagulants: Secondary | ICD-10-CM | POA: Diagnosis not present

## 2023-07-03 DIAGNOSIS — E782 Mixed hyperlipidemia: Secondary | ICD-10-CM | POA: Diagnosis not present

## 2023-07-03 DIAGNOSIS — I5022 Chronic systolic (congestive) heart failure: Secondary | ICD-10-CM | POA: Diagnosis not present

## 2023-07-03 DIAGNOSIS — Z87891 Personal history of nicotine dependence: Secondary | ICD-10-CM | POA: Diagnosis not present

## 2023-07-03 DIAGNOSIS — Z794 Long term (current) use of insulin: Secondary | ICD-10-CM | POA: Diagnosis not present

## 2023-07-03 DIAGNOSIS — I714 Abdominal aortic aneurysm, without rupture, unspecified: Secondary | ICD-10-CM | POA: Diagnosis not present

## 2023-07-10 DIAGNOSIS — I2510 Atherosclerotic heart disease of native coronary artery without angina pectoris: Secondary | ICD-10-CM | POA: Diagnosis not present

## 2023-07-10 DIAGNOSIS — I714 Abdominal aortic aneurysm, without rupture, unspecified: Secondary | ICD-10-CM | POA: Diagnosis not present

## 2023-07-10 DIAGNOSIS — I5022 Chronic systolic (congestive) heart failure: Secondary | ICD-10-CM | POA: Diagnosis not present

## 2023-07-10 DIAGNOSIS — Z794 Long term (current) use of insulin: Secondary | ICD-10-CM | POA: Diagnosis not present

## 2023-07-10 DIAGNOSIS — I110 Hypertensive heart disease with heart failure: Secondary | ICD-10-CM | POA: Diagnosis not present

## 2023-07-10 DIAGNOSIS — I480 Paroxysmal atrial fibrillation: Secondary | ICD-10-CM | POA: Diagnosis not present

## 2023-07-10 DIAGNOSIS — E1165 Type 2 diabetes mellitus with hyperglycemia: Secondary | ICD-10-CM | POA: Diagnosis not present

## 2023-07-10 DIAGNOSIS — M545 Low back pain, unspecified: Secondary | ICD-10-CM | POA: Diagnosis not present

## 2023-07-10 DIAGNOSIS — G8929 Other chronic pain: Secondary | ICD-10-CM | POA: Diagnosis not present

## 2023-07-10 DIAGNOSIS — G8192 Hemiplegia, unspecified affecting left dominant side: Secondary | ICD-10-CM | POA: Diagnosis not present

## 2023-07-10 DIAGNOSIS — E782 Mixed hyperlipidemia: Secondary | ICD-10-CM | POA: Diagnosis not present

## 2023-07-10 DIAGNOSIS — Z7901 Long term (current) use of anticoagulants: Secondary | ICD-10-CM | POA: Diagnosis not present

## 2023-07-10 DIAGNOSIS — Z951 Presence of aortocoronary bypass graft: Secondary | ICD-10-CM | POA: Diagnosis not present

## 2023-07-10 DIAGNOSIS — R1310 Dysphagia, unspecified: Secondary | ICD-10-CM | POA: Diagnosis not present

## 2023-07-10 DIAGNOSIS — Z87891 Personal history of nicotine dependence: Secondary | ICD-10-CM | POA: Diagnosis not present

## 2023-07-15 DIAGNOSIS — I480 Paroxysmal atrial fibrillation: Secondary | ICD-10-CM | POA: Diagnosis not present

## 2023-07-15 DIAGNOSIS — Z87891 Personal history of nicotine dependence: Secondary | ICD-10-CM | POA: Diagnosis not present

## 2023-07-15 DIAGNOSIS — I5022 Chronic systolic (congestive) heart failure: Secondary | ICD-10-CM | POA: Diagnosis not present

## 2023-07-15 DIAGNOSIS — Z7901 Long term (current) use of anticoagulants: Secondary | ICD-10-CM | POA: Diagnosis not present

## 2023-07-15 DIAGNOSIS — R1310 Dysphagia, unspecified: Secondary | ICD-10-CM | POA: Diagnosis not present

## 2023-07-15 DIAGNOSIS — I2510 Atherosclerotic heart disease of native coronary artery without angina pectoris: Secondary | ICD-10-CM | POA: Diagnosis not present

## 2023-07-15 DIAGNOSIS — M545 Low back pain, unspecified: Secondary | ICD-10-CM | POA: Diagnosis not present

## 2023-07-15 DIAGNOSIS — I110 Hypertensive heart disease with heart failure: Secondary | ICD-10-CM | POA: Diagnosis not present

## 2023-07-15 DIAGNOSIS — E782 Mixed hyperlipidemia: Secondary | ICD-10-CM | POA: Diagnosis not present

## 2023-07-15 DIAGNOSIS — Z951 Presence of aortocoronary bypass graft: Secondary | ICD-10-CM | POA: Diagnosis not present

## 2023-07-15 DIAGNOSIS — Z794 Long term (current) use of insulin: Secondary | ICD-10-CM | POA: Diagnosis not present

## 2023-07-15 DIAGNOSIS — G8192 Hemiplegia, unspecified affecting left dominant side: Secondary | ICD-10-CM | POA: Diagnosis not present

## 2023-07-15 DIAGNOSIS — I714 Abdominal aortic aneurysm, without rupture, unspecified: Secondary | ICD-10-CM | POA: Diagnosis not present

## 2023-07-15 DIAGNOSIS — G8929 Other chronic pain: Secondary | ICD-10-CM | POA: Diagnosis not present

## 2023-07-15 DIAGNOSIS — E1165 Type 2 diabetes mellitus with hyperglycemia: Secondary | ICD-10-CM | POA: Diagnosis not present

## 2023-07-16 DIAGNOSIS — E1151 Type 2 diabetes mellitus with diabetic peripheral angiopathy without gangrene: Secondary | ICD-10-CM | POA: Diagnosis not present

## 2023-07-16 DIAGNOSIS — E1129 Type 2 diabetes mellitus with other diabetic kidney complication: Secondary | ICD-10-CM | POA: Diagnosis not present

## 2023-07-21 DIAGNOSIS — Z7901 Long term (current) use of anticoagulants: Secondary | ICD-10-CM | POA: Diagnosis not present

## 2023-07-21 DIAGNOSIS — R1310 Dysphagia, unspecified: Secondary | ICD-10-CM | POA: Diagnosis not present

## 2023-07-21 DIAGNOSIS — E1165 Type 2 diabetes mellitus with hyperglycemia: Secondary | ICD-10-CM | POA: Diagnosis not present

## 2023-07-21 DIAGNOSIS — Z951 Presence of aortocoronary bypass graft: Secondary | ICD-10-CM | POA: Diagnosis not present

## 2023-07-21 DIAGNOSIS — M545 Low back pain, unspecified: Secondary | ICD-10-CM | POA: Diagnosis not present

## 2023-07-21 DIAGNOSIS — Z794 Long term (current) use of insulin: Secondary | ICD-10-CM | POA: Diagnosis not present

## 2023-07-21 DIAGNOSIS — I2510 Atherosclerotic heart disease of native coronary artery without angina pectoris: Secondary | ICD-10-CM | POA: Diagnosis not present

## 2023-07-21 DIAGNOSIS — G8192 Hemiplegia, unspecified affecting left dominant side: Secondary | ICD-10-CM | POA: Diagnosis not present

## 2023-07-21 DIAGNOSIS — G8929 Other chronic pain: Secondary | ICD-10-CM | POA: Diagnosis not present

## 2023-07-21 DIAGNOSIS — E782 Mixed hyperlipidemia: Secondary | ICD-10-CM | POA: Diagnosis not present

## 2023-07-21 DIAGNOSIS — I480 Paroxysmal atrial fibrillation: Secondary | ICD-10-CM | POA: Diagnosis not present

## 2023-07-21 DIAGNOSIS — Z87891 Personal history of nicotine dependence: Secondary | ICD-10-CM | POA: Diagnosis not present

## 2023-07-21 DIAGNOSIS — I5022 Chronic systolic (congestive) heart failure: Secondary | ICD-10-CM | POA: Diagnosis not present

## 2023-07-21 DIAGNOSIS — I110 Hypertensive heart disease with heart failure: Secondary | ICD-10-CM | POA: Diagnosis not present

## 2023-07-21 DIAGNOSIS — I714 Abdominal aortic aneurysm, without rupture, unspecified: Secondary | ICD-10-CM | POA: Diagnosis not present

## 2023-08-27 ENCOUNTER — Encounter: Payer: Self-pay | Admitting: Diagnostic Neuroimaging

## 2023-08-27 ENCOUNTER — Inpatient Hospital Stay: Admitting: Diagnostic Neuroimaging

## 2023-08-30 ENCOUNTER — Other Ambulatory Visit: Payer: Self-pay | Admitting: Nurse Practitioner

## 2023-09-09 DIAGNOSIS — E1129 Type 2 diabetes mellitus with other diabetic kidney complication: Secondary | ICD-10-CM | POA: Diagnosis not present

## 2023-09-24 DIAGNOSIS — Z299 Encounter for prophylactic measures, unspecified: Secondary | ICD-10-CM | POA: Diagnosis not present

## 2023-09-24 DIAGNOSIS — H353211 Exudative age-related macular degeneration, right eye, with active choroidal neovascularization: Secondary | ICD-10-CM | POA: Diagnosis not present

## 2023-09-24 DIAGNOSIS — I10 Essential (primary) hypertension: Secondary | ICD-10-CM | POA: Diagnosis not present

## 2023-09-24 DIAGNOSIS — I509 Heart failure, unspecified: Secondary | ICD-10-CM | POA: Diagnosis not present

## 2023-09-24 DIAGNOSIS — Z0000 Encounter for general adult medical examination without abnormal findings: Secondary | ICD-10-CM | POA: Diagnosis not present

## 2023-09-24 DIAGNOSIS — E1151 Type 2 diabetes mellitus with diabetic peripheral angiopathy without gangrene: Secondary | ICD-10-CM | POA: Diagnosis not present

## 2023-09-29 DIAGNOSIS — H26493 Other secondary cataract, bilateral: Secondary | ICD-10-CM | POA: Diagnosis not present

## 2023-10-24 DIAGNOSIS — E119 Type 2 diabetes mellitus without complications: Secondary | ICD-10-CM | POA: Diagnosis not present

## 2023-10-24 DIAGNOSIS — H353290 Exudative age-related macular degeneration, unspecified eye, stage unspecified: Secondary | ICD-10-CM | POA: Diagnosis not present

## 2023-10-24 DIAGNOSIS — Z299 Encounter for prophylactic measures, unspecified: Secondary | ICD-10-CM | POA: Diagnosis not present

## 2023-10-24 DIAGNOSIS — I509 Heart failure, unspecified: Secondary | ICD-10-CM | POA: Diagnosis not present

## 2023-10-24 DIAGNOSIS — I10 Essential (primary) hypertension: Secondary | ICD-10-CM | POA: Diagnosis not present
